# Patient Record
Sex: Female | Born: 1955 | Race: White | Hispanic: No | Marital: Married | Smoking: Current every day smoker
Health system: Southern US, Community
[De-identification: ages and names within clinical notes are randomized; demographics above are authoritative.]

## PROBLEM LIST (undated history)

## (undated) DIAGNOSIS — F17200 Nicotine dependence, unspecified, uncomplicated: Secondary | ICD-10-CM

## (undated) DIAGNOSIS — E785 Hyperlipidemia, unspecified: Secondary | ICD-10-CM

## (undated) DIAGNOSIS — Z9889 Other specified postprocedural states: Secondary | ICD-10-CM

## (undated) DIAGNOSIS — C801 Malignant (primary) neoplasm, unspecified: Secondary | ICD-10-CM

## (undated) DIAGNOSIS — I10 Essential (primary) hypertension: Secondary | ICD-10-CM

## (undated) DIAGNOSIS — Z8049 Family history of malignant neoplasm of other genital organs: Secondary | ICD-10-CM

## (undated) DIAGNOSIS — I2510 Atherosclerotic heart disease of native coronary artery without angina pectoris: Secondary | ICD-10-CM

## (undated) DIAGNOSIS — I219 Acute myocardial infarction, unspecified: Secondary | ICD-10-CM

## (undated) DIAGNOSIS — Z806 Family history of leukemia: Secondary | ICD-10-CM

## (undated) DIAGNOSIS — R112 Nausea with vomiting, unspecified: Secondary | ICD-10-CM

## (undated) DIAGNOSIS — Z8052 Family history of malignant neoplasm of bladder: Secondary | ICD-10-CM

## (undated) DIAGNOSIS — Z803 Family history of malignant neoplasm of breast: Secondary | ICD-10-CM

## (undated) HISTORY — DX: Family history of malignant neoplasm of other genital organs: Z80.49

## (undated) HISTORY — DX: Family history of malignant neoplasm of breast: Z80.3

## (undated) HISTORY — DX: Family history of leukemia: Z80.6

## (undated) HISTORY — DX: Family history of malignant neoplasm of bladder: Z80.52

## (undated) HISTORY — PX: ABDOMINAL HYSTERECTOMY: SHX81

## (undated) MED FILL — KCl 20 MEQ/L (0.15%) in NaCl 0.9% Inj: INTRAVENOUS | Qty: 1000 | Status: AC

## (undated) MED FILL — KCl 40 MEQ/L (0.3%) in NaCl 0.9% Inj: INTRAVENOUS | Qty: 1000 | Status: AC

## (undated) MED FILL — Ondansetron HCl Inj 40 MG/20ML (2 MG/ML): INTRAMUSCULAR | Qty: 4 | Status: AC

---

## 2018-01-10 ENCOUNTER — Encounter (HOSPITAL_COMMUNITY): Payer: Self-pay | Admitting: Physician Assistant

## 2018-01-10 ENCOUNTER — Other Ambulatory Visit: Payer: Self-pay

## 2018-01-10 ENCOUNTER — Inpatient Hospital Stay (HOSPITAL_COMMUNITY)
Admission: EM | Admit: 2018-01-10 | Discharge: 2018-01-12 | DRG: 247 | Disposition: A | Discharge status: Home / Self Care | Payer: PRIVATE HEALTH INSURANCE | Attending: Cardiovascular Disease | Admitting: Cardiovascular Disease

## 2018-01-10 ENCOUNTER — Inpatient Hospital Stay (HOSPITAL_COMMUNITY)
Admission: EM | Disposition: A | Discharge status: Home / Self Care | Payer: Self-pay | Source: Home / Self Care | Attending: Cardiovascular Disease

## 2018-01-10 DIAGNOSIS — I2102 ST elevation (STEMI) myocardial infarction involving left anterior descending coronary artery: Secondary | ICD-10-CM | POA: Diagnosis not present

## 2018-01-10 DIAGNOSIS — I2510 Atherosclerotic heart disease of native coronary artery without angina pectoris: Secondary | ICD-10-CM | POA: Diagnosis present

## 2018-01-10 DIAGNOSIS — I200 Unstable angina: Secondary | ICD-10-CM | POA: Diagnosis present

## 2018-01-10 DIAGNOSIS — E785 Hyperlipidemia, unspecified: Secondary | ICD-10-CM

## 2018-01-10 DIAGNOSIS — Z955 Presence of coronary angioplasty implant and graft: Secondary | ICD-10-CM

## 2018-01-10 DIAGNOSIS — Z8249 Family history of ischemic heart disease and other diseases of the circulatory system: Secondary | ICD-10-CM | POA: Diagnosis not present

## 2018-01-10 DIAGNOSIS — I340 Nonrheumatic mitral (valve) insufficiency: Secondary | ICD-10-CM | POA: Diagnosis not present

## 2018-01-10 DIAGNOSIS — F17200 Nicotine dependence, unspecified, uncomplicated: Secondary | ICD-10-CM

## 2018-01-10 DIAGNOSIS — I2109 ST elevation (STEMI) myocardial infarction involving other coronary artery of anterior wall: Secondary | ICD-10-CM | POA: Diagnosis present

## 2018-01-10 DIAGNOSIS — I213 ST elevation (STEMI) myocardial infarction of unspecified site: Secondary | ICD-10-CM

## 2018-01-10 HISTORY — DX: Nicotine dependence, unspecified, uncomplicated: F17.200

## 2018-01-10 HISTORY — PX: LEFT HEART CATH AND CORONARY ANGIOGRAPHY: CATH118249

## 2018-01-10 HISTORY — DX: Essential (primary) hypertension: I10

## 2018-01-10 HISTORY — DX: Atherosclerotic heart disease of native coronary artery without angina pectoris: I25.10

## 2018-01-10 HISTORY — DX: Hyperlipidemia, unspecified: E78.5

## 2018-01-10 HISTORY — PX: CORONARY STENT INTERVENTION: CATH118234

## 2018-01-10 LAB — COMPREHENSIVE METABOLIC PANEL
ALBUMIN: 3.8 g/dL (ref 3.5–5.0)
ALT: 18 U/L (ref 14–54)
ANION GAP: 12 (ref 5–15)
AST: 17 U/L (ref 15–41)
Alkaline Phosphatase: 118 U/L (ref 38–126)
BUN: 14 mg/dL (ref 6–20)
CALCIUM: 10 mg/dL (ref 8.9–10.3)
CHLORIDE: 106 mmol/L (ref 101–111)
CO2: 22 mmol/L (ref 22–32)
CREATININE: 0.87 mg/dL (ref 0.44–1.00)
Glucose, Bld: 134 mg/dL — ABNORMAL HIGH (ref 65–99)
Potassium: 3.1 mmol/L — ABNORMAL LOW (ref 3.5–5.1)
Sodium: 140 mmol/L (ref 135–145)
Total Bilirubin: 0.8 mg/dL (ref 0.3–1.2)
Total Protein: 6.8 g/dL (ref 6.5–8.1)

## 2018-01-10 LAB — POCT ACTIVATED CLOTTING TIME
Activated Clotting Time: 400 seconds
Activated Clotting Time: 351 seconds

## 2018-01-10 LAB — POCT I-STAT TROPONIN I: TROPONIN I, POC: 0.12 ng/mL — AB (ref 0.00–0.08)

## 2018-01-10 LAB — TROPONIN I
TROPONIN I: 37.4 ng/mL — AB (ref <0.03)
Troponin I: 42.58 ng/mL (ref <0.03)

## 2018-01-10 LAB — MAGNESIUM: MAGNESIUM: 1.9 mg/dL (ref 1.7–2.4)

## 2018-01-10 LAB — I-STAT CHEM 8, ED
BUN: 16 mg/dL (ref 6–20)
CHLORIDE: 105 mmol/L (ref 101–111)
Calcium, Ion: 1.22 mmol/L (ref 1.15–1.40)
Creatinine, Ser: 0.9 mg/dL (ref 0.44–1.00)
GLUCOSE: 145 mg/dL — AB (ref 65–99)
HCT: 45 % (ref 36.0–46.0)
Hemoglobin: 15.3 g/dL — ABNORMAL HIGH (ref 12.0–15.0)
POTASSIUM: 3.2 mmol/L — AB (ref 3.5–5.1)
Sodium: 139 mmol/L (ref 135–145)
TCO2: 22 mmol/L (ref 22–32)

## 2018-01-10 LAB — HEMOGLOBIN A1C
HEMOGLOBIN A1C: 4.5 % — AB (ref 4.8–5.6)
Mean Plasma Glucose: 82.45 mg/dL

## 2018-01-10 LAB — MRSA PCR SCREENING: MRSA BY PCR: NEGATIVE

## 2018-01-10 SURGERY — LEFT HEART CATH AND CORONARY ANGIOGRAPHY
Anesthesia: LOCAL

## 2018-01-10 MED ORDER — ASPIRIN EC 81 MG PO TBEC
81.0000 mg | DELAYED_RELEASE_TABLET | Freq: Every day | ORAL | Status: DC
  Administered 2018-01-11 – 2018-01-12 (×2): 81 mg via ORAL
  Filled 2018-01-10 (×2): qty 1

## 2018-01-10 MED ORDER — ASPIRIN 81 MG PO CHEW: 81.0000 mg | CHEWABLE_TABLET | Freq: Every day | ORAL | Status: DC

## 2018-01-10 MED ORDER — HEPARIN (PORCINE) IN NACL 2-0.9 UNITS/ML
INTRAMUSCULAR | Status: AC | PRN
  Administered 2018-01-10 (×2): 500 mL via INTRA_ARTERIAL

## 2018-01-10 MED ORDER — VERAPAMIL HCL 2.5 MG/ML IV SOLN
INTRAVENOUS | Status: DC | PRN
  Administered 2018-01-10: 10 mL via INTRA_ARTERIAL

## 2018-01-10 MED ORDER — NITROGLYCERIN 1 MG/10 ML FOR IR/CATH LAB
INTRA_ARTERIAL | Status: AC
  Filled 2018-01-10: qty 10

## 2018-01-10 MED ORDER — FENTANYL CITRATE (PF) 100 MCG/2ML IJ SOLN
INTRAMUSCULAR | Status: DC | PRN
  Administered 2018-01-10 (×2): 25 ug via INTRAVENOUS

## 2018-01-10 MED ORDER — HYDRALAZINE HCL 20 MG/ML IJ SOLN: 5.0000 mg | INTRAMUSCULAR | Status: AC | PRN

## 2018-01-10 MED ORDER — LIDOCAINE HCL (PF) 1 % IJ SOLN
INTRAMUSCULAR | Status: DC | PRN
  Administered 2018-01-10: 2 mL

## 2018-01-10 MED ORDER — METOPROLOL TARTRATE 12.5 MG HALF TABLET
12.5000 mg | ORAL_TABLET | Freq: Two times a day (BID) | ORAL | Status: DC
  Administered 2018-01-10 – 2018-01-12 (×5): 12.5 mg via ORAL
  Filled 2018-01-10 (×5): qty 1

## 2018-01-10 MED ORDER — SODIUM CHLORIDE 0.9 % IV SOLN: 250.0000 mL | INTRAVENOUS | Status: DC | PRN

## 2018-01-10 MED ORDER — FUROSEMIDE 10 MG/ML IJ SOLN
40.0000 mg | Freq: Once | INTRAMUSCULAR | Status: AC
  Administered 2018-01-10: 40 mg via INTRAVENOUS

## 2018-01-10 MED ORDER — LABETALOL HCL 5 MG/ML IV SOLN
10.0000 mg | INTRAVENOUS | Status: AC | PRN
  Administered 2018-01-10 (×4): 10 mg via INTRAVENOUS
  Filled 2018-01-10 (×2): qty 4

## 2018-01-10 MED ORDER — FUROSEMIDE 10 MG/ML IJ SOLN
INTRAMUSCULAR | Status: AC
  Filled 2018-01-10: qty 4

## 2018-01-10 MED ORDER — MIDAZOLAM HCL 2 MG/2ML IJ SOLN
INTRAMUSCULAR | Status: DC | PRN
  Administered 2018-01-10 (×2): 1 mg via INTRAVENOUS

## 2018-01-10 MED ORDER — NITROGLYCERIN 0.4 MG SL SUBL
0.4000 mg | SUBLINGUAL_TABLET | SUBLINGUAL | Status: DC | PRN
  Administered 2018-01-10 (×2): 0.4 mg via SUBLINGUAL
  Filled 2018-01-10 (×3): qty 1

## 2018-01-10 MED ORDER — TICAGRELOR 90 MG PO TABS
ORAL_TABLET | ORAL | Status: DC | PRN
  Administered 2018-01-10: 180 mg via ORAL

## 2018-01-10 MED ORDER — ZOLPIDEM TARTRATE 5 MG PO TABS
5.0000 mg | ORAL_TABLET | Freq: Every evening | ORAL | Status: DC | PRN
  Administered 2018-01-10 – 2018-01-11 (×2): 5 mg via ORAL
  Filled 2018-01-10 (×2): qty 1

## 2018-01-10 MED ORDER — MIDAZOLAM HCL 2 MG/2ML IJ SOLN
INTRAMUSCULAR | Status: AC
  Filled 2018-01-10: qty 2

## 2018-01-10 MED ORDER — HEPARIN SODIUM (PORCINE) 1000 UNIT/ML IJ SOLN
INTRAMUSCULAR | Status: AC
  Filled 2018-01-10: qty 1

## 2018-01-10 MED ORDER — HEPARIN BOLUS VIA INFUSION: 4000.0000 [IU] | Freq: Once | INTRAVENOUS | Status: DC

## 2018-01-10 MED ORDER — TIROFIBAN (AGGRASTAT) BOLUS VIA INFUSION
INTRAVENOUS | Status: DC | PRN
  Administered 2018-01-10: 1700 ug via INTRAVENOUS

## 2018-01-10 MED ORDER — HEPARIN SODIUM (PORCINE) 5000 UNIT/ML IJ SOLN: 4000.0000 [IU] | Freq: Once | INTRAMUSCULAR | Status: DC

## 2018-01-10 MED ORDER — NITROGLYCERIN 1 MG/10 ML FOR IR/CATH LAB
INTRA_ARTERIAL | Status: DC | PRN
  Administered 2018-01-10: 200 ug via INTRACORONARY

## 2018-01-10 MED ORDER — ACETAMINOPHEN 325 MG PO TABS: 650.0000 mg | ORAL_TABLET | ORAL | Status: DC | PRN

## 2018-01-10 MED ORDER — HEPARIN SODIUM (PORCINE) 1000 UNIT/ML IJ SOLN
INTRAMUSCULAR | Status: DC | PRN
  Administered 2018-01-10: 8000 [IU] via INTRAVENOUS

## 2018-01-10 MED ORDER — LIDOCAINE HCL (PF) 1 % IJ SOLN
INTRAMUSCULAR | Status: AC
  Filled 2018-01-10: qty 30

## 2018-01-10 MED ORDER — TIROFIBAN HCL IN NACL 5-0.9 MG/100ML-% IV SOLN
INTRAVENOUS | Status: DC | PRN
  Administered 2018-01-10: 0.15 ug/kg/min via INTRAVENOUS

## 2018-01-10 MED ORDER — TIROFIBAN HCL IN NACL 5-0.9 MG/100ML-% IV SOLN: 0.1500 ug/kg/min | INTRAVENOUS | Status: AC

## 2018-01-10 MED ORDER — FENTANYL CITRATE (PF) 100 MCG/2ML IJ SOLN
INTRAMUSCULAR | Status: AC
  Filled 2018-01-10: qty 2

## 2018-01-10 MED ORDER — IOHEXOL 350 MG/ML SOLN
INTRAVENOUS | Status: DC | PRN
  Administered 2018-01-10: 90 mL via INTRA_ARTERIAL

## 2018-01-10 MED ORDER — SODIUM CHLORIDE 0.9% FLUSH
3.0000 mL | Freq: Two times a day (BID) | INTRAVENOUS | Status: DC
  Administered 2018-01-10 – 2018-01-11 (×4): 3 mL via INTRAVENOUS

## 2018-01-10 MED ORDER — POTASSIUM CHLORIDE CRYS ER 20 MEQ PO TBCR
40.0000 meq | EXTENDED_RELEASE_TABLET | Freq: Once | ORAL | Status: AC
  Administered 2018-01-10: 40 meq via ORAL
  Filled 2018-01-10: qty 2

## 2018-01-10 MED ORDER — IOPAMIDOL (ISOVUE-370) INJECTION 76%
INTRAVENOUS | Status: DC | PRN
  Administered 2018-01-10: 40 mL via INTRA_ARTERIAL

## 2018-01-10 MED ORDER — IOPAMIDOL (ISOVUE-370) INJECTION 76%
INTRAVENOUS | Status: AC
  Filled 2018-01-10: qty 50

## 2018-01-10 MED ORDER — ONDANSETRON HCL 4 MG/2ML IJ SOLN: 4.0000 mg | Freq: Four times a day (QID) | INTRAMUSCULAR | Status: DC | PRN

## 2018-01-10 MED ORDER — HEPARIN (PORCINE) IN NACL 1000-0.9 UT/500ML-% IV SOLN
INTRAVENOUS | Status: AC
  Filled 2018-01-10: qty 1000

## 2018-01-10 MED ORDER — TICAGRELOR 90 MG PO TABS
ORAL_TABLET | ORAL | Status: AC
  Filled 2018-01-10: qty 2

## 2018-01-10 MED ORDER — ONDANSETRON HCL 4 MG/2ML IJ SOLN
4.0000 mg | Freq: Four times a day (QID) | INTRAMUSCULAR | Status: DC | PRN
  Administered 2018-01-10: 4 mg via INTRAVENOUS
  Filled 2018-01-10: qty 2

## 2018-01-10 MED ORDER — LISINOPRIL 10 MG PO TABS
10.0000 mg | ORAL_TABLET | Freq: Every day | ORAL | Status: DC
  Administered 2018-01-10 – 2018-01-12 (×3): 10 mg via ORAL
  Filled 2018-01-10 (×3): qty 1

## 2018-01-10 MED ORDER — ATORVASTATIN CALCIUM 80 MG PO TABS
80.0000 mg | ORAL_TABLET | Freq: Every day | ORAL | Status: DC
  Administered 2018-01-10 – 2018-01-11 (×2): 80 mg via ORAL
  Filled 2018-01-10 (×2): qty 1

## 2018-01-10 MED ORDER — VERAPAMIL HCL 2.5 MG/ML IV SOLN
INTRAVENOUS | Status: AC
  Filled 2018-01-10: qty 2

## 2018-01-10 MED ORDER — SODIUM CHLORIDE 0.9% FLUSH: 3.0000 mL | INTRAVENOUS | Status: DC | PRN

## 2018-01-10 MED ORDER — TICAGRELOR 90 MG PO TABS
90.0000 mg | ORAL_TABLET | Freq: Two times a day (BID) | ORAL | Status: DC
  Administered 2018-01-10 – 2018-01-12 (×4): 90 mg via ORAL
  Filled 2018-01-10 (×4): qty 1

## 2018-01-10 SURGICAL SUPPLY — 21 items
BALLN SAPPHIRE 2.5X15 (BALLOONS) ×2
BALLN ~~LOC~~ EMERGE MR 3.5X12 (BALLOONS) ×2
BALLOON SAPPHIRE 2.5X15 (BALLOONS) ×1 IMPLANT
BALLOON ~~LOC~~ EMERGE MR 3.5X12 (BALLOONS) ×1 IMPLANT
CATH INFINITI JR4 5F (CATHETERS) ×2 IMPLANT
CATH LAUNCHER 6FR EBU 3 (CATHETERS) ×2 IMPLANT
CATH OPTICROSS 40MHZ (CATHETERS) ×2 IMPLANT
DEVICE RAD COMP TR BAND LRG (VASCULAR PRODUCTS) ×4 IMPLANT
GUIDEWIRE INQWIRE 1.5J.035X260 (WIRE) ×1 IMPLANT
INQWIRE 1.5J .035X260CM (WIRE) ×2
KIT HEART LEFT (KITS) ×2 IMPLANT
NEEDLE PERC 21GX4CM (NEEDLE) ×2 IMPLANT
PACK CARDIAC CATHETERIZATION (CUSTOM PROCEDURE TRAY) ×2 IMPLANT
SHEATH RAIN RADIAL 21G 6FR (SHEATH) ×2 IMPLANT
SLED PULL BACK IVUS (MISCELLANEOUS) ×2 IMPLANT
STENT SYNERGY DES 3.5X16 (Permanent Stent) ×2 IMPLANT
STENT SYNERGY DES 3X24 (Permanent Stent) ×2 IMPLANT
TRANSDUCER W/STOPCOCK (MISCELLANEOUS) ×2 IMPLANT
TUBING CIL FLEX 10 FLL-RA (TUBING) ×2 IMPLANT
WIRE ASAHI PROWATER 180CM (WIRE) ×2 IMPLANT
WIRE HI TORQ BMW 190CM (WIRE) ×4 IMPLANT

## 2018-01-10 NOTE — Progress Notes (Signed)
At 1620 today pt was c/o SOB, cp 6/10, fine crackles auscultated in bl bases.  12 lead EKG performed possible changes indicated. Pt received 2 sl nitro, pain stated 2/10.  Dr. Irish Lack paged stated no significant changes in EKG ordered 40 mg IV lasix.  Will continue to monitor and assess pt closely.

## 2018-01-10 NOTE — ED Triage Notes (Signed)
Patient states she was up getting ready  For work and started having mid sternal chest pain. Patient was given asa 324mg  ,m ntg x 1 and fentanyl 50 mg iv per ems. Upon arrival to ED rates pain as 5/10, stays in her chest , no sob .

## 2018-01-10 NOTE — Progress Notes (Signed)
Called to 2H04 to assess pt. Post cath- R radial site. Upon arrival noted 2 TR Bands in place, R hand noted discolored, cool, with patient c/o paraesthesia. To hand. Noted BP 180/110s, r upper extremity elevated upon pillow. R radial pleath  With a positive waveform-   RN addressed B/P - responded to meds, Top TR band removed- manual pressure held to r forearm for moderate hematoma, bottom TR band  Deflation started from 12 cc- color and hand sensation began to improve. Manual pressure to decompress hematoma to r forearm -to wrist successful with completed deflation and removal of TR band from r wrist. Jarrett Soho, RN to take over holding/expressing very mild hematoma to wrist area. Pt. Did not have pain and tolerated well. Upon my exiting- r hand pink with restored sensation. Pt. Left in the presence of Jarrett Soho ,RN and protege' in stable condition.

## 2018-01-10 NOTE — H&P (Addendum)
Cardiology Admission History and Physical:   Patient ID: Traci Fuller; MRN: [id number]; DOB: 12-16-1955   Admission date: 01/10/2018  Primary Care Provider: No primary care provider on file. Primary Cardiologist: new - Dr. Gwenlyn Found Primary Electrophysiologist:    Chief Complaint:  Anterior STEMI  Patient Profile:   Traci Fuller is a 62 y.o. female with no prior medical history.  History of Present Illness:   Traci Fuller presents to [facility] via EMS for chest pain. She had chest pain 2 night ago that she thought was indigestion that eventually resolved. She had a recurrence of chest pain this morning at approximately 0700 that radiated to both arms. She takes no medications at home, does not have a PCP, and has no past medical history. She is a current everyday smoker with a 45 pack year history. EMS administered 324 mg ASA. On arrival, she complained of 5/10 chest pain described as a crushing pressure.   Will admit to cardiology for emergent heart cath. She was given 4000 U heparin.   History reviewed. No pertinent past medical history.  History reviewed. No pertinent surgical history.   Medications Prior to Admission: Prior to Admission medications   Not on File     Allergies:   Not on File  Social History:   Social History   Socioeconomic History  . Marital status: Not on file    Spouse name: Not on file  . Number of children: Not on file  . Years of education: Not on file  . Highest education level: Not on file  Occupational History  . Not on file  Social Needs  . Financial resource strain: Not on file  . Food insecurity:    Worry: Not on file    Inability: Not on file  . Transportation needs:    Medical: Not on file    Non-medical: Not on file  Tobacco Use  . Smoking status: Not on file  Substance and Sexual Activity  . Alcohol use: Not on file  . Drug use: Not on file  . Sexual activity: Not on file  Lifestyle  . Physical activity:    Days per week: Not on  file    Minutes per session: Not on file  . Stress: Not on file  Relationships  . Social connections:    Talks on phone: Not on file    Gets together: Not on file    Attends religious service: Not on file    Active member of club or organization: Not on file    Attends meetings of clubs or organizations: Not on file    Relationship status: Not on file  . Intimate partner violence:    Fear of current or ex partner: Not on file    Emotionally abused: Not on file    Physically abused: Not on file    Forced sexual activity: Not on file  Other Topics Concern  . Not on file  Social History Narrative  . Not on file    Family History:   The patient's family history includes Hypertension in her father.    ROS:  Please see the history of present illness.  All other ROS reviewed and negative.     Physical Exam/Data:   Vitals:   01/10/18 0840  SpO2: 96%   No intake or output data in the 24 hours ending 01/10/18 0844 There were no vitals filed for this visit. There is no height or weight on file to calculate BMI.  General:  Well  nourished, well developed, in no acute distress HEENT: normal Neck: no JVD Vascular: No carotid bruits  Cardiac:  normal S1, S2; RRR; no murmur Lungs:  clear to auscultation bilaterally, no wheezing, rhonchi or rales  Abd: soft, nontender, no hepatomegaly  Ext: no edema Musculoskeletal:  No deformities, BUE and BLE strength normal and equal Skin: warm and dry  Neuro:  CNs 2-12 intact, no focal abnormalities noted Psych:  Normal affect    EKG:  The ECG that was done and was personally reviewed and demonstrates anterior STEMI  Relevant CV Studies:  none  Laboratory Data:  Chemistry Recent Labs  Lab 01/10/18 0835  NA 139  K 3.2*  CL 105  GLUCOSE 145*  BUN 16  CREATININE 0.90    No results for input(s): PROT, ALBUMIN, AST, ALT, ALKPHOS, BILITOT in the last 168 hours. Hematology Recent Labs  Lab 01/10/18 0835  HGB 15.3*  HCT 45.0    Cardiac EnzymesNo results for input(s): TROPONINI in the last 168 hours. No results for input(s): TROPIPOC in the last 168 hours.  BNPNo results for input(s): BNP, PROBNP in the last 168 hours.  DDimer No results for input(s): DDIMER in the last 168 hours.  Radiology/Studies:  No results found.  Assessment and Plan:   Anterior STEMI Pt presents with anterior ST elevations. She is taken emergently to the cath lab. Will order A1c, lipids, and echo. Started 80 mg lipitor.      Severity of Illness: The appropriate patient status for this patient is INPATIENT. Inpatient status is judged to be reasonable and necessary in order to provide the required intensity of service to ensure the patient's safety. The patient's presenting symptoms, physical exam findings, and initial radiographic and laboratory data in the context of their chronic comorbidities is felt to place them at high risk for further clinical deterioration. Furthermore, it is not anticipated that the patient will be medically stable for discharge from the hospital within 2 midnights of admission. The following factors support the patient status of inpatient.   " The patient's presenting symptoms include unstable angina. " The worrisome physical exam findings include . " The initial radiographic and laboratory data are worrisome because of anterior stemi. " The chronic co-morbidities include current smoker.   * I certify that at the point of admission it is my clinical judgment that the patient will require inpatient hospital care spanning beyond 2 midnights from the point of admission due to high intensity of service, high risk for further deterioration and high frequency of surveillance required.*    For questions or updates, please contact [place] Please consult [email or web address] for contact info under Cardiology/STEMI.    Signed, Ledora Bottcher, [place]  01/10/2018 8:44 AM    Agree with note by Fabian Sharp  PA-C  62 year old Caucasian female without prior cardiac history had chest pain the night before last and again at 7:00 this morning with radiation to her arms.  EMS was called.  EKG shows anterior ST segment elevation.  She is hemodynamically stable and her exam is benign.  The plan is to take her directly to the Cath Lab for angiography and direct intervention.  Lorretta Harp, M.D., [place], [facility], Laverta [place] [address] [place], [place]  [address]  [phone] 01/10/2018 12:52 PM

## 2018-01-10 NOTE — ED Notes (Signed)
Transported emergently to the cath lab.

## 2018-01-10 NOTE — ED Provider Notes (Signed)
Emergency Department Provider Note   I have reviewed the triage vital signs and the nursing notes.   HISTORY  Chief Complaint Code STEMI   HPI Traci Fuller is a 62 y.o. female with past history of cigarette smoking resents to the emergency department for evaluation of acute onset left chest pain rating down the arm.  Pain symptoms began approximately 6:30 AM the patient was walking to feed her dogs.  EMS was called to the scene where of a obtained an EKG concerning for STEMI.  The patient was given nitroglycerin, aspirin, fentanyl in route.  On arrival to the emergency department she continues to have mild to moderate left-sided chest discomfort.  She denies any dyspnea or lightheadedness.  No fevers or chills.  No prior history of coronary artery disease.  History reviewed. No pertinent past medical history.  Patient Active Problem List   Diagnosis Date Noted  . Acute anterior wall MI ([place]) 01/10/2018  . Unstable angina ([place]) 01/10/2018     Allergies Patient has no allergy information on record.  Family History  Problem Relation Age of Onset  . Hypertension Father     Social History Social History   Tobacco Use  . Smoking status: Never Smoker  . Smokeless tobacco: Never Used  Substance Use Topics  . Alcohol use: Yes  . Drug use: Never    Review of Systems  Constitutional: No fever/chills Eyes: No visual changes. ENT: No sore throat. Cardiovascular: Positive chest pain. Respiratory: Denies shortness of breath. Gastrointestinal: No abdominal pain.  No nausea, no vomiting.  No diarrhea.  No constipation. Genitourinary: Negative for dysuria. Musculoskeletal: Negative for back pain. Skin: Negative for rash. Neurological: Negative for headaches, focal weakness or numbness.  10-point ROS otherwise negative.  ____________________________________________   PHYSICAL EXAM:  VITAL SIGNS: Vitals:   01/10/18 1450 01/10/18 1500  BP: (!) 154/87 (!) 163/98    Pulse: 68 68  Resp:  19  Temp:    SpO2:  98%    Constitutional: Alert and oriented. Patient appears somewhat uncomfortable.  Eyes: Conjunctivae are normal.  Head: Atraumatic. Nose: No congestion/rhinnorhea. Mouth/Throat: Mucous membranes are moist.   Neck: No stridor.  Cardiovascular: Normal rate, regular rhythm. Good peripheral circulation. Grossly normal heart sounds.   Respiratory: Normal respiratory effort.  No retractions. Lungs CTAB. Gastrointestinal: Soft and nontender. No distention.  Musculoskeletal: No lower extremity tenderness nor edema. No gross deformities of extremities. Neurologic:  Normal speech and language. No gross focal neurologic deficits are appreciated.  Skin:  Skin is warm, dry and intact. No rash noted.  ____________________________________________   LABS (all labs ordered are listed, but only abnormal results are displayed)  Labs Reviewed  COMPREHENSIVE METABOLIC PANEL - Abnormal; Notable for the following components:      Result Value   Potassium 3.1 (*)    Glucose, Bld 134 (*)    All other components within normal limits  HEMOGLOBIN A1C - Abnormal; Notable for the following components:   Hgb A1c MFr Bld 4.5 (*)    All other components within normal limits  TROPONIN I - Abnormal; Notable for the following components:   Troponin I 42.58 (*)    All other components within normal limits  I-STAT CHEM 8, ED - Abnormal; Notable for the following components:   Potassium 3.2 (*)    Glucose, Bld 145 (*)    Hemoglobin 15.3 (*)    All other components within normal limits  POCT I-STAT TROPONIN I - Abnormal; Notable for the following  components:   Troponin i, poc 0.12 (*)    All other components within normal limits  MRSA PCR SCREENING  MAGNESIUM  TROPONIN I  TROPONIN I  HIV ANTIBODY (ROUTINE TESTING)   ____________________________________________  EKG  Reviewed EMS EKG. Patient with ST elevation in V1-V3. NSR. Positive STEMI.   ____________________________________________  RADIOLOGY  None ____________________________________________   PROCEDURES  Procedure(s) performed:   Procedures  CRITICAL CARE Performed by: Margette Fast Total critical care time: 10 minutes Critical care time was exclusive of separately billable procedures and treating other patients. Critical care was necessary to treat or prevent imminent or life-threatening deterioration. Critical care was time spent personally by me on the following activities: development of treatment plan with patient and/or surrogate as well as nursing, discussions with consultants, evaluation of patient's response to treatment, examination of patient, obtaining history from patient or surrogate, ordering and performing treatments and interventions, ordering and review of laboratory studies, ordering and review of radiographic studies, pulse oximetry and re-evaluation of patient's condition.  Nanda Quinton, MD Emergency Medicine  ____________________________________________   INITIAL IMPRESSION / ASSESSMENT AND PLAN / ED COURSE  Pertinent labs & imaging results that were available during my care of the patient were reviewed by me and considered in my medical decision making (see chart for details).  Patient presents to the emergency department as a code STEMI after review of field EKG. cardiology team at bedside on the patient's arrival with plan to take directly to the heart catheterization lab.  Patient's vital sign are largely unremarkable.  She does not require airway management. Zoll in place. Patient is safe for transport to cath lab.   Discussed patient's case with Cardiology. Care transferred to Cardiology service.  I reviewed all nursing notes, vitals, pertinent old records, EKGs, labs, imaging (as available).  ____________________________________________  FINAL CLINICAL IMPRESSION(S) / ED DIAGNOSES  Final diagnoses:  ST elevation myocardial  infarction (STEMI), unspecified artery ([place])     MEDICATIONS GIVEN DURING THIS VISIT:  Medications  heparin injection 4,000 Units ( Intravenous MAR Unhold 01/10/18 1044)  aspirin EC tablet 81 mg (has no administration in time range)  nitroGLYCERIN (NITROSTAT) SL tablet 0.4 mg (has no administration in time range)  metoprolol tartrate (LOPRESSOR) tablet 12.5 mg (12.5 mg Oral Given 01/10/18 1450)  atorvastatin (LIPITOR) tablet 80 mg (has no administration in time range)  labetalol (NORMODYNE,TRANDATE) injection 10 mg (10 mg Intravenous Given 01/10/18 1437)  hydrALAZINE (APRESOLINE) injection 5 mg (has no administration in time range)  acetaminophen (TYLENOL) tablet 650 mg (has no administration in time range)  ondansetron (ZOFRAN) injection 4 mg (has no administration in time range)  sodium chloride flush (NS) 0.9 % injection 3 mL (3 mLs Intravenous Given 01/10/18 1200)  sodium chloride flush (NS) 0.9 % injection 3 mL (has no administration in time range)  0.9 %  sodium chloride infusion (has no administration in time range)  ticagrelor (BRILINTA) tablet 90 mg (has no administration in time range)  tirofiban (AGGRASTAT) infusion 50 mcg/mL 100 mL (0.15 mcg/kg/min  68 kg Intravenous Not Given 01/10/18 1444)  heparin infusion 2 units/mL in 0.9 % sodium chloride (500 mLs Intra-arterial New Bag/Given 01/10/18 0848)  potassium chloride SA (K-DUR,KLOR-CON) CR tablet 40 mEq (40 mEq Oral Given 01/10/18 1338)    Note:  This document was prepared using Dragon voice recognition software and may include unintentional dictation errors.  Nanda Quinton, MD Emergency Medicine    Long, [facility], MD 01/10/18 [phone]

## 2018-01-10 NOTE — Progress Notes (Signed)
Cards fellow Charissa Bash called regarding pt's most recent BP 170/105. MD request to keep SBP <160. Orders to start lisinopril was given. Will initiate orders and continue to monitor patient.

## 2018-01-10 NOTE — Progress Notes (Signed)
CRITICAL VALUE ALERT  Critical Value:  42.58  Date & Time Notied:  01/10/2018, 1530  Provider Notified: Dr. Irish Lack   Orders Received/Actions taken: no new orders, expected result

## 2018-01-11 ENCOUNTER — Inpatient Hospital Stay (HOSPITAL_COMMUNITY): Payer: PRIVATE HEALTH INSURANCE

## 2018-01-11 ENCOUNTER — Other Ambulatory Visit: Payer: Self-pay

## 2018-01-11 DIAGNOSIS — I340 Nonrheumatic mitral (valve) insufficiency: Secondary | ICD-10-CM

## 2018-01-11 LAB — BASIC METABOLIC PANEL
Anion gap: 9 (ref 5–15)
BUN: 11 mg/dL (ref 6–20)
CHLORIDE: 106 mmol/L (ref 101–111)
CO2: 24 mmol/L (ref 22–32)
Calcium: 10.2 mg/dL (ref 8.9–10.3)
Creatinine, Ser: 0.91 mg/dL (ref 0.44–1.00)
GFR calc Af Amer: >60 mL/min (ref >60)
GLUCOSE: 108 mg/dL — AB (ref 65–99)
POTASSIUM: 4.1 mmol/L (ref 3.5–5.1)
Sodium: 139 mmol/L (ref 135–145)

## 2018-01-11 LAB — LIPID PANEL
Cholesterol: 169 mg/dL (ref 0–200)
HDL: 36 mg/dL — ABNORMAL LOW (ref >40)
LDL CALC: 100 mg/dL — AB (ref 0–99)
Total CHOL/HDL Ratio: 4.7 RATIO
Triglycerides: 165 mg/dL — ABNORMAL HIGH (ref <150)
VLDL: 33 mg/dL (ref 0–40)

## 2018-01-11 LAB — CBC
HEMATOCRIT: 46.7 % — AB (ref 36.0–46.0)
Hemoglobin: 14.8 g/dL (ref 12.0–15.0)
MCH: 29 pg (ref 26.0–34.0)
MCHC: 31.7 g/dL (ref 30.0–36.0)
MCV: 91.6 fL (ref 78.0–100.0)
Platelets: 256 10*3/uL (ref 150–400)
RBC: 5.1 MIL/uL (ref 3.87–5.11)
RDW: 14.6 % (ref 11.5–15.5)
WBC: 13.1 10*3/uL — ABNORMAL HIGH (ref 4.0–10.5)

## 2018-01-11 LAB — ECHOCARDIOGRAM COMPLETE
Height: 61 in
WEIGHTICAEL: 2624.36 [oz_av]

## 2018-01-11 LAB — TROPONIN I: TROPONIN I: 22.26 ng/mL — AB (ref <0.03)

## 2018-01-11 LAB — HIV ANTIBODY (ROUTINE TESTING W REFLEX): HIV SCREEN 4TH GENERATION: NONREACTIVE

## 2018-01-11 MED ORDER — NICOTINE 7 MG/24HR TD PT24
7.0000 mg | MEDICATED_PATCH | Freq: Every day | TRANSDERMAL | Status: DC
  Administered 2018-01-11 – 2018-01-12 (×2): 7 mg via TRANSDERMAL
  Filled 2018-01-11 (×2): qty 1

## 2018-01-11 MED FILL — Heparin Sod (Porcine)-NaCl IV Soln 1000 Unit/500ML-0.9%: INTRAVENOUS | Qty: 1000 | Status: AC

## 2018-01-11 NOTE — Progress Notes (Signed)
Per insurance check on Brilinta  Brilinta 90 mg bid- is covered however she must pay her Deductible which is 150.00 and the Drug will be 60.00

## 2018-01-11 NOTE — Progress Notes (Signed)
  Echocardiogram 2D Echocardiogram has been performed.  Traci Fuller 01/11/2018, 3:20 PM

## 2018-01-11 NOTE — Progress Notes (Addendum)
3:00pm Spoke w patient's spouse at bedside while patient was getting echo. Explained use of 30 day coupon. He verbalized understanding. He provided insurance card which showed Rx coverage. Benefit check is pending.  Discussed importance of not stopping drug. If copay is expensive there is patient assistance, showed number on back of pamphlet. Also explained there may be alternatives if cost is prohibitive, and they would need to discuss that with their MD. 4:30pm Provided patient and spouse with copay information and reinforce use of 30 day coupon and copay card

## 2018-01-11 NOTE — Progress Notes (Signed)
Called MD Charissa Bash regarding patient's blood pressure 160/91. No new orders received. Will continue to monitor patient closely.

## 2018-01-11 NOTE — Progress Notes (Addendum)
CARDIAC REHAB PHASE I   PRE:  Rate/Rhythm: 73 SR  BP:  Supine:   Sitting: 147/77  Standing:    SaO2: 96%RA  MODE:  Ambulation: 370 ft   POST:  Rate/Rhythm: 84 SR  BP:  Supine:   Sitting: 165/88  Standing:    SaO2: 99%RA 1000-1046 Pt walked 370 ft on RA with steady gait and no CP. Tolerated well. MI education completed. Reviewed NTG use, MI restrictions, stent and purpose of brilinta, heart healthy food choices, smoking cessation and CRP 2. Gave fake cigarette and smoking cessation handout. Pt has never quit before. Referring to [place] Phase 2 with comment that pt needs staged PCI first. Pt needs to see case manager re brilinta. Ex ed not discussed as pt for staged PCI. Refer to cardiology for activity at home.  Graylon Good, RN BSN  01/11/2018 10:43 AM

## 2018-01-11 NOTE — Plan of Care (Signed)
  Problem: Education: Goal: Understanding of cardiac disease, CV risk reduction, and recovery process will improve Outcome: Progressing Goal: Understanding of medication regimen will improve Outcome: Progressing   Problem: Cardiac: Goal: Ability to achieve and maintain adequate cardiopulmonary perfusion will improve Outcome: Progressing Goal: Vascular access site(s) Level 0-1 will be maintained Outcome: Progressing   Problem: Education: Goal: Knowledge of General Education information will improve Outcome: Progressing   Problem: Pain Managment: Goal: General experience of comfort will improve Outcome: Progressing

## 2018-01-11 NOTE — Progress Notes (Signed)
Progress Note  Patient Name: Traci Fuller Date of Encounter: 01/11/2018  Primary Cardiologist: Gwenlyn Found  Subjective   62 year old Caucasian female postop day 1 anterior STEMI treated with PCI and drug-eluting stenting of her proximal LAD by Dr. Irish Lack with overlapping drug-eluting stents that was IVIS guided because of the proximal edge dissection.  She does have residual residual disease in her RCA and circumflex.  She denies chest pain or shortness of breath.  Inpatient Medications    Scheduled Meds: . aspirin EC  81 mg Oral Daily  . atorvastatin  80 mg Oral q1800  . heparin  4,000 Units Intravenous Once  . lisinopril  10 mg Oral Daily  . metoprolol tartrate  12.5 mg Oral BID  . sodium chloride flush  3 mL Intravenous Q12H  . ticagrelor  90 mg Oral BID   Continuous Infusions: . sodium chloride     PRN Meds: sodium chloride, acetaminophen, nitroGLYCERIN, ondansetron (ZOFRAN) IV, sodium chloride flush, zolpidem   Vital Signs    Vitals:   01/11/18 0500 01/11/18 0600 01/11/18 0700 01/11/18 0800  BP: (!) 132/92 (!) 153/87 (!) 151/87 (!) 149/79  Pulse: 65 66 69 72  Resp: 20 16 19 16   Temp:      TempSrc:      SpO2: 93% 95% 94% 96%  Weight: 164 lb 0.4 oz (74.4 kg)     Height:        Intake/Output Summary (Last 24 hours) at 01/11/2018 0901 Last data filed at 01/11/2018 0600 Gross per 24 hour  Intake 960 ml  Output 2350 ml  Net -1390 ml   Filed Weights   01/10/18 0844 01/11/18 0500  Weight: 150 lb (68 kg) 164 lb 0.4 oz (74.4 kg)    Telemetry    Normal sinus rhythm with occasional PVCs- Personally Reviewed  ECG    Normal sinus rhythm at 63 with septal Q waves, anterolateral T wave inversion, left axis deviation and inferior T wave inversion- Personally Reviewed  Physical Exam   GEN: No acute distress.   Neck: No JVD Cardiac: RRR, no murmurs, rubs, or gallops.  Respiratory: Clear to auscultation bilaterally. GI: Soft, nontender, non-distended  MS: No edema;  No deformity. Neuro:  Nonfocal  Psych: Normal affect  Extremity: Right radial puncture site appears well-healed  Labs    Chemistry Recent Labs  Lab 01/10/18 0830 01/10/18 0835 01/11/18 0450  NA 140 139 139  K 3.1* 3.2* 4.1  CL 106 105 106  CO2 22  --  24  GLUCOSE 134* 145* 108*  BUN 14 16 11   CREATININE 0.87 0.90 0.91  CALCIUM 10.0  --  10.2  PROT 6.8  --   --   ALBUMIN 3.8  --   --   AST 17  --   --   ALT 18  --   --   ALKPHOS 118  --   --   BILITOT 0.8  --   --   GFRNONAA >60  --  >60  GFRAA >60  --  >60  ANIONGAP 12  --  9     Hematology Recent Labs  Lab 01/10/18 0835 01/11/18 0450  WBC  --  13.1*  RBC  --  5.10  HGB 15.3* 14.8  HCT 45.0 46.7*  MCV  --  91.6  MCH  --  29.0  MCHC  --  31.7  RDW  --  14.6  PLT  --  256    Cardiac Enzymes Recent Labs  Lab  01/10/18 1403 01/10/18 1904 01/10/18 2324  TROPONINI 42.58* 37.40* 22.26*    Recent Labs  Lab 01/10/18 0833  TROPIPOC 0.12*     BNPNo results for input(s): BNP, PROBNP in the last 168 hours.   DDimer No results for input(s): DDIMER in the last 168 hours.   Radiology    No results found.  Cardiac Studies   Cardiac catheterization/PCI (01/10/2018)  Conclusion     Mid RCA lesion is 75% stenosed.  Prox LAD lesion is 50% stenosed.  Mid LAD-1 lesion is 100% stenosed.  A drug-eluting stent was successfully placed using a STENT SYNERGY DES 3X24.  IVUS showed proximal edge dissection.  A drug-eluting stent was successfully placed using a STENT SYNERGY DES 3.5X16 to cover teh proximal edge of the above stent.  Post intervention, there is a 0% residual stenosis.  Mid Cx lesion is 60-70% stenosed.  LV end diastolic pressure is moderately elevated.  There is no aortic valve stenosis.  Significant tortuosity in the right subclavian made catheter torquing difficult for the RCA.   Significant tortuosity in the right subclavian made catheter torquing difficult for the RCA.     Recommend uninterrupted dual antiplatelet therapy with Aspirin 81mg  QD and Ticagrelor 90mg  BID for a minimum of 12 months (ACS - Class I recommendation).   Consider PCI of the RCA at a later time.  Elevated LVEDP.  She will likely need to recover from this anterior MI for a few weeks.  Consider femoral approach for the RCA PCI.      Patient Profile     62 y.o. female without prior cardiac history who does have a history of tobacco abuse.  She is on no medications at home.  She developed chest pain early yesterday morning and was brought to [facility] by EMS where her EKG showed anterior ST segment elevation.  She was brought urgently to the Cath Lab by Dr. Irish Lack who performed PCI and I was guided drug-eluting stenting of her proximal LAD.  Her troponins rose to approximately 40.  She does have residual disease in her RCA and circumflex.  Assessment & Plan    1: Coronary artery disease- postop day #1 anterior STEMI treated with IVUS guided proximal LAD PCI and drug-eluting stenting by Dr. Irish Lack with residual disease in the RCA and circumflex.  She is on DAPT in addition to optimal guideline driven pharmacology including high-dose statin therapy, beta-blocker and ACE inhibitor.  2: Hyperlipidemia- on high-dose statin therapy  2D echo is pending.  The patient will be transferred to telemetry bed today with cardiac rehab consultation.  She will be ambulated with intent for discharge tomorrow morning if she remains clinically stable.  For questions or updates, please contact [place] Please consult [email or web address] for contact info under Cardiology/STEMI.      Signed, Quay Burow, MD  01/11/2018, 9:01 AM

## 2018-01-12 ENCOUNTER — Encounter (HOSPITAL_COMMUNITY): Payer: Self-pay | Admitting: Physician Assistant

## 2018-01-12 ENCOUNTER — Telehealth: Payer: Self-pay | Admitting: *Deleted

## 2018-01-12 DIAGNOSIS — E785 Hyperlipidemia, unspecified: Secondary | ICD-10-CM

## 2018-01-12 DIAGNOSIS — F17200 Nicotine dependence, unspecified, uncomplicated: Secondary | ICD-10-CM

## 2018-01-12 MED ORDER — ASPIRIN 81 MG PO TBEC: 81.0000 mg | DELAYED_RELEASE_TABLET | Freq: Every day | ORAL | 3 refills | Status: DC

## 2018-01-12 MED ORDER — LISINOPRIL 10 MG PO TABS: 10.0000 mg | ORAL_TABLET | Freq: Every day | ORAL | 3 refills | Status: DC

## 2018-01-12 MED ORDER — METOPROLOL TARTRATE 25 MG PO TABS: 12.5000 mg | ORAL_TABLET | Freq: Two times a day (BID) | ORAL | 3 refills | Status: DC

## 2018-01-12 MED ORDER — TICAGRELOR 90 MG PO TABS: 90.0000 mg | ORAL_TABLET | Freq: Two times a day (BID) | ORAL | 0 refills | Status: DC

## 2018-01-12 MED ORDER — ATORVASTATIN CALCIUM 80 MG PO TABS: 80.0000 mg | ORAL_TABLET | Freq: Every day | ORAL | 3 refills | Status: DC

## 2018-01-12 MED ORDER — NITROGLYCERIN 0.4 MG SL SUBL: 0.4000 mg | SUBLINGUAL_TABLET | SUBLINGUAL | 1 refills | Status: DC | PRN

## 2018-01-12 MED ORDER — TICAGRELOR 90 MG PO TABS: 90.0000 mg | ORAL_TABLET | Freq: Two times a day (BID) | ORAL | 3 refills | Status: DC

## 2018-01-12 NOTE — Discharge Summary (Signed)
Discharge Summary    Patient ID: Traci Fuller,  MRN: [id number], DOB/AGE: Nov 13, 1955 62 y.o.  Admit date: 01/10/2018 Discharge date: 01/12/2018  Primary Care Provider: No primary care provider on file. Primary Cardiologist: Quay Burow, MD  Discharge Diagnoses    Principal Problem:   Acute anterior wall MI [facility]) Active Problems:   Unstable angina ([place])   Current smoker   Hyperlipidemia LDL goal <70   Allergies Not on File  Diagnostic Studies/Procedures    Left heart cath 01/10/18:  Mid RCA lesion is 75% stenosed.  Prox LAD lesion is 50% stenosed.  Mid LAD-1 lesion is 100% stenosed.  A drug-eluting stent was successfully placed using a STENT SYNERGY DES 3X24.  IVUS showed proximal edge dissection.  A drug-eluting stent was successfully placed using a STENT SYNERGY DES 3.5X16 to cover teh proximal edge of the above stent.  Post intervention, there is a 0% residual stenosis.  Mid Cx lesion is 60-70% stenosed.  LV end diastolic pressure is moderately elevated.  There is no aortic valve stenosis.  Significant tortuosity in the right subclavian made catheter torquing difficult for the RCA.  Significant tortuosity in the right subclavian made catheter torquing difficult for the RCA.   Recommend uninterrupted dual antiplatelet therapy with Aspirin 81mg  QD and Ticagrelor 90mg  BIDfor a minimum of 12 months (ACS - Class I recommendation).  Consider PCI of the RCA at a later time. Elevated LVEDP. She will likely need to recover from this anterior MI for a few weeks. Consider femoral approach for the RCA PCI.    Echo 01/11/18: Study Conclusions - Left ventricle: The cavity size was normal. There was moderate concentric hypertrophy. Systolic function was normal. The estimated ejection fraction was in the range of 55% to 60%. Wall motion was normal; there were no regional wall motion abnormalities. Doppler parameters are consistent with  abnormal left ventricular relaxation (grade 1 diastolic dysfunction). Doppler parameters are consistent with elevated ventricular end-diastolic filling pressure. - Ventricular septum: Septal motion showed paradox. - Aortic valve: Trileaflet; normal thickness leaflets. There was no regurgitation. Valve area (VTI): 3.97 cm^2. Valve area (Vmax): 3.49 cm^2. Valve area (Vmean): 3.62 cm^2. - Mitral valve: There was mild regurgitation. - Right ventricle: The cavity size was normal. Wall thickness was normal. Systolic function was normal. - Tricuspid valve: There was no regurgitation. - Pulmonary arteries: Systolic pressure could not be accurately estimated. - Inferior vena cava: The vessel was normal in size. - Pericardium, extracardiac: There was no pericardial effusion.   History of Present Illness     Traci Fuller presented to [facility] via EMS 01/10/18 for chest pain. She had chest pain 2 night prior that she thought was indigestion that eventually resolved. She had a recurrence of chest pain this morning at approximately 0700 that radiated to both arms. She takes no medications at home, does not have a PCP, and has no past medical history. She is a current everyday smoker with a 45 pack year history. EMS administered 324 mg ASA. On arrival, she complained of 5/10 chest pain described as a crushing pressure.   She was admitted to cardiology for emergent heart cath. She was given 4000 U heparin in the ER.  Hospital Course     Consultants: none  Anterior STEMI, CAD, s/p DES to midLAD with a proximal edge dissection treated with a second DES She tolerated the procedure well. She was started on ASA and brilinta without bleeding problems or shortness of breath. Right radial access site  is C/D/I. Echo with normal function and grade 1 DD. RCA with 75% may need to be treated in the future. For now she is chest pain free.   Hyperlipidemia with LDL goal < 70 01/11/2018: Cholesterol 169;  HDL 36; LDL Cholesterol 100; Triglycerides 165; VLDL 33 Started on 80 mg lipitor. She will need fasting lipids and LFTs in 6 weeks.    Essential hypertension Pressures have been slightly elevated. Continue lopressor and lisinopril. May need titration in the outpatient setting.     Current smoker Encouraged cessation.  _____________  Discharge Vitals Blood pressure 138/90, pulse 70, temperature 98.1 F (36.7 C), temperature source Oral, resp. rate 17, height 5\' 1"  (1.549 m), weight 161 lb 3.2 oz (73.1 kg), SpO2 96 %.  Filed Weights   01/11/18 0500 01/11/18 1600 01/12/18 0443  Weight: 164 lb 0.4 oz (74.4 kg) 160 lb 14.4 oz (73 kg) 161 lb 3.2 oz (73.1 kg)    Labs & Radiologic Studies    CBC Recent Labs    01/10/18 0835 01/11/18 0450  WBC  --  13.1*  HGB 15.3* 14.8  HCT 45.0 46.7*  MCV  --  91.6  PLT  --  093   Basic Metabolic Panel Recent Labs    01/10/18 0830 01/10/18 0835 01/11/18 0450  NA 140 139 139  K 3.1* 3.2* 4.1  CL 106 105 106  CO2 22  --  24  GLUCOSE 134* 145* 108*  BUN 14 16 11   CREATININE 0.87 0.90 0.91  CALCIUM 10.0  --  10.2  MG 1.9  --   --    Liver Function Tests Recent Labs    01/10/18 0830  AST 17  ALT 18  ALKPHOS 118  BILITOT 0.8  PROT 6.8  ALBUMIN 3.8   No results for input(s): LIPASE, AMYLASE in the last 72 hours. Cardiac Enzymes Recent Labs    01/10/18 1403 01/10/18 1904 01/10/18 2324  TROPONINI 42.58* 37.40* 22.26*   BNP Invalid input(s): POCBNP D-Dimer No results for input(s): DDIMER in the last 72 hours. Hemoglobin A1C Recent Labs    01/10/18 0830  HGBA1C 4.5*   Fasting Lipid Panel Recent Labs    01/11/18 0450  CHOL 169  HDL 36*  LDLCALC 100*  TRIG 165*  CHOLHDL 4.7   Thyroid Function Tests No results for input(s): TSH, T4TOTAL, T3FREE, THYROIDAB in the last 72 hours.  Invalid input(s): FREET3 _____________  No results found. Disposition   Pt is being discharged home today in good  condition.  Follow-up Plans & Appointments    Follow-up Information    Erlene Quan, PA-C Follow up on 01/19/2018.   Specialties:  Cardiology, Radiology Why:  11:30 am for TCM Contact information: [address] [place] [address] [phone]          Discharge Instructions    AMB Referral to Cardiac Rehabilitation - Phase II   Complete by:  As directed    Diagnosis:   Coronary Stents STEMI     Amb Referral to Cardiac Rehabilitation   Complete by:  As directed    For staged PCI   Diagnosis:   Coronary Stents STEMI     Diet - low sodium heart healthy   Complete by:  As directed    Discharge instructions   Complete by:  As directed    No driving for 1 week. No lifting over 5 lbs for 1 week. No sexual activity for 1 week. You may return to work  on 01/17/18. Keep procedure site clean & dry. If you notice increased pain, swelling, bleeding or pus, call/return!  You may shower, but no soaking baths/hot tubs/pools for 1 week.   Increase activity slowly   Complete by:  As directed       Discharge Medications   Allergies as of 01/12/2018   Not on File     Medication List    STOP taking these medications   pseudoephedrine-acetaminophen 30-500 MG Tabs tablet Commonly known as:  TYLENOL SINUS     TAKE these medications   aspirin 81 MG EC tablet Take 1 tablet (81 mg total) by mouth daily.   atorvastatin 80 MG tablet Commonly known as:  LIPITOR Take 1 tablet (80 mg total) by mouth daily at 6 PM.   cetirizine 10 MG tablet Commonly known as:  ZYRTEC Take 10 mg by mouth daily.   lisinopril 10 MG tablet Commonly known as:  PRINIVIL,ZESTRIL Take 1 tablet (10 mg total) by mouth daily.   metoprolol tartrate 25 MG tablet Commonly known as:  LOPRESSOR Take 0.5 tablets (12.5 mg total) by mouth 2 (two) times daily.   nitroGLYCERIN 0.4 MG SL tablet Commonly known as:  NITROSTAT Place 1 tablet (0.4 mg total) under the tongue every 5 (five) minutes x 3  doses as needed for chest pain.   ticagrelor 90 MG Tabs tablet Commonly known as:  BRILINTA Take 1 tablet (90 mg total) by mouth 2 (two) times daily.        Aspirin prescribed at discharge?  Yes High Intensity Statin Prescribed? (Lipitor 40-80mg  or Crestor 20-40mg ): Yes Beta Blocker Prescribed? Yes For EF <40%, was ACEI/ARB Prescribed? Yes ADP Receptor Inhibitor Prescribed? (i.e. Plavix etc.-Includes Medically Managed Patients): Yes For EF <40%, Aldosterone Inhibitor Prescribed? No: normal ef Was EF assessed during THIS hospitalization? Yes Was Cardiac Rehab II ordered? (Included Medically managed Patients): Yes   Outstanding Labs/Studies   Lipids and LFTs in 6 weeks  Duration of Discharge Encounter   Greater than 30 minutes including physician time.  Signed, [place], [place] 01/12/2018, 10:12 AM

## 2018-01-12 NOTE — Progress Notes (Addendum)
Progress Note  Patient Name: Traci Fuller Date of Encounter: 01/12/2018  Primary Cardiologist: Quay Burow, MD   Subjective   Pt is doing well after cath. No bleeding issues, no CP or SOB. She is ready for discharge.  Inpatient Medications    Scheduled Meds: . aspirin EC  81 mg Oral Daily  . atorvastatin  80 mg Oral q1800  . heparin  4,000 Units Intravenous Once  . lisinopril  10 mg Oral Daily  . metoprolol tartrate  12.5 mg Oral BID  . nicotine  7 mg Transdermal Daily  . sodium chloride flush  3 mL Intravenous Q12H  . ticagrelor  90 mg Oral BID   Continuous Infusions: . sodium chloride     PRN Meds: sodium chloride, acetaminophen, nitroGLYCERIN, ondansetron (ZOFRAN) IV, sodium chloride flush, zolpidem   Vital Signs    Vitals:   01/11/18 2105 01/11/18 2110 01/12/18 0443 01/12/18 0443  BP: (!) 145/88   138/90  Pulse: 78   70  Resp:      Temp:  98.1 F (36.7 C)  98.1 F (36.7 C)  TempSrc:  Oral  Oral  SpO2:  95%  96%  Weight:   161 lb 3.2 oz (73.1 kg)   Height:        Intake/Output Summary (Last 24 hours) at 01/12/2018 0819 Last data filed at 01/11/2018 1000 Gross per 24 hour  Intake -  Output 100 ml  Net -100 ml   Filed Weights   01/11/18 0500 01/11/18 1600 01/12/18 0443  Weight: 164 lb 0.4 oz (74.4 kg) 160 lb 14.4 oz (73 kg) 161 lb 3.2 oz (73.1 kg)    Telemetry    sinus - Personally Reviewed  ECG    No new tracings - Personally Reviewed  Physical Exam   GEN: No acute distress.   Neck: No JVD Cardiac: RRR, no murmurs, rubs, or gallops.  Respiratory: Clear to auscultation bilaterally, diminished in bases GI: Soft, nontender, non-distended  MS: No edema; No deformity. Neuro:  Nonfocal  Psych: Normal affect   Labs    Chemistry Recent Labs  Lab 01/10/18 0830 01/10/18 0835 01/11/18 0450  NA 140 139 139  K 3.1* 3.2* 4.1  CL 106 105 106  CO2 22  --  24  GLUCOSE 134* 145* 108*  BUN 14 16 11   CREATININE 0.87 0.90 0.91  CALCIUM 10.0   --  10.2  PROT 6.8  --   --   ALBUMIN 3.8  --   --   AST 17  --   --   ALT 18  --   --   ALKPHOS 118  --   --   BILITOT 0.8  --   --   GFRNONAA >60  --  >60  GFRAA >60  --  >60  ANIONGAP 12  --  9     Hematology Recent Labs  Lab 01/10/18 0835 01/11/18 0450  WBC  --  13.1*  RBC  --  5.10  HGB 15.3* 14.8  HCT 45.0 46.7*  MCV  --  91.6  MCH  --  29.0  MCHC  --  31.7  RDW  --  14.6  PLT  --  256    Cardiac Enzymes Recent Labs  Lab 01/10/18 1403 01/10/18 1904 01/10/18 2324  TROPONINI 42.58* 37.40* 22.26*    Recent Labs  Lab 01/10/18 0833  TROPIPOC 0.12*     BNPNo results for input(s): BNP, PROBNP in the last 168 hours.  DDimer No results for input(s): DDIMER in the last 168 hours.   Radiology    No results found.  Cardiac Studies   Left heart cath 01/10/18:  Mid RCA lesion is 75% stenosed.  Prox LAD lesion is 50% stenosed.  Mid LAD-1 lesion is 100% stenosed.  A drug-eluting stent was successfully placed using a STENT SYNERGY DES 3X24.  IVUS showed proximal edge dissection.  A drug-eluting stent was successfully placed using a STENT SYNERGY DES 3.5X16 to cover teh proximal edge of the above stent.  Post intervention, there is a 0% residual stenosis.  Mid Cx lesion is 60-70% stenosed.  LV end diastolic pressure is moderately elevated.  There is no aortic valve stenosis.  Significant tortuosity in the right subclavian made catheter torquing difficult for the RCA.   Significant tortuosity in the right subclavian made catheter torquing difficult for the RCA.    Recommend uninterrupted dual antiplatelet therapy with Aspirin 81mg  QD and Ticagrelor 90mg  BID for a minimum of 12 months (ACS - Class I recommendation).   Consider PCI of the RCA at a later time.  Elevated LVEDP.  She will likely need to recover from this anterior MI for a few weeks.  Consider femoral approach for the RCA PCI.    Echo 01/11/18: Study Conclusions - Left ventricle:  The cavity size was normal. There was moderate   concentric hypertrophy. Systolic function was normal. The   estimated ejection fraction was in the range of 55% to 60%. Wall   motion was normal; there were no regional wall motion   abnormalities. Doppler parameters are consistent with abnormal   left ventricular relaxation (grade 1 diastolic dysfunction).   Doppler parameters are consistent with elevated ventricular   end-diastolic filling pressure. - Ventricular septum: Septal motion showed paradox. - Aortic valve: Trileaflet; normal thickness leaflets. There was no   regurgitation. Valve area (VTI): 3.97 cm^2. Valve area (Vmax):   3.49 cm^2. Valve area (Vmean): 3.62 cm^2. - Mitral valve: There was mild regurgitation. - Right ventricle: The cavity size was normal. Wall thickness was   normal. Systolic function was normal. - Tricuspid valve: There was no regurgitation. - Pulmonary arteries: Systolic pressure could not be accurately   estimated. - Inferior vena cava: The vessel was normal in size. - Pericardium, extracardiac: There was no pericardial effusion.   Patient Profile     62 y.o. female without prior cardiac history who does have a history of tobacco abuse.  She is on no medications at home.  She developed chest pain early yesterday morning and was brought to [facility] by EMS where her EKG showed anterior ST segment elevation.  She was brought urgently to the Cath Lab by Dr. Irish Lack who performed PCI and I was guided drug-eluting stenting of her proximal LAD.  Her troponins rose to approximately 40.  She does have residual disease in her RCA and circumflex.  Assessment & Plan    1. Anterior STEMI, coronary artery disease - pt taken to cath lab emergently yesterday and received DES to mid LAD and a second stent to cover the proximal edge of the LAD stent - there was also 75% stenosis of the RCA that can be considered for treatment in the future - echo with normal LVEF and  grade 1 DD - pt is tolerating ASA and brilinta well - pt denies further chest pain and wants to discharge home today   2. HLD - 01/11/2018: Cholesterol 169; HDL 36; LDL Cholesterol 100; Triglycerides 165;  VLDL 33 - started high dose statin therapy - will need lipids and LFTs in 6 weeks - LDL goal less than 70   3. Current smoker - encouraged cessation   For questions or updates, please contact [place] Please consult [email or web address] for contact info under Cardiology/STEMI.      Signed, Tami Lin [place], PA  01/12/2018, 8:19 AM    Agree with note by Fabian Sharp PA-C  Patient is ready for discharge.  Postop day 2 anterior STEMI treated with IVIS guided proximal LAD PCI and overlapping drug-eluting stenting using 2 drug-eluting stents.  She did have moderately severe mid to distal dominant RCA stenosis.  LV function is normal by 2D echo.  She had no recurrent symptoms.  Her radial puncture site is somewhat ecchymotic but healed.  She is hemodynamically letter cardia graphically stable.  She is on appropriate guideline driven post MI pharmacology including DAPT, and high-dose statin therapy.  Plan will be for discharge today, outpatient follow-up with intent to perform staged RCA intervention. She has agreed to stop smoking.  Lorretta Harp, M.D., [place], [facility], [place] [address] [place], [place]  [address]  [phone] 01/12/2018 9:35 AM

## 2018-01-12 NOTE — Telephone Encounter (Signed)
-----   Message from Janan Ridge sent at 01/12/2018  8:43 AM EDT ----- Pt has a TCM appt on 01/12/18 at 11:30 with [place] per Janace Hoard

## 2018-01-12 NOTE — Discharge Instructions (Signed)

## 2018-01-12 NOTE — Progress Notes (Signed)
[id number] Pt dressed and ready to go home. Had not walked in hall again since we walked yesterday so we walked 470 ft on RA with steady gait and no CP. Tolerated well. Re inforced MI restrictions. Graylon Good RN BSN 01/12/2018 9:31 AM

## 2018-01-13 NOTE — Telephone Encounter (Signed)
Patient contacted regarding discharge from [facility] on 01/12/18.  Patient understands to follow up with provider Kerin Ransom, [place] on 01/19/18 at 50 at [facility]. Patient understands discharge instructions? yes Patient understands medications and regiment? yes Patient understands to bring all medications to this visit? yes

## 2018-01-19 ENCOUNTER — Encounter: Payer: Self-pay | Admitting: Cardiology

## 2018-01-19 ENCOUNTER — Ambulatory Visit (INDEPENDENT_AMBULATORY_CARE_PROVIDER_SITE_OTHER): Payer: No Typology Code available for payment source | Admitting: Cardiology

## 2018-01-19 VITALS — BP 144/84 | HR 77 | Ht 61.0 in | Wt 163.6 lb

## 2018-01-19 DIAGNOSIS — E785 Hyperlipidemia, unspecified: Secondary | ICD-10-CM

## 2018-01-19 DIAGNOSIS — I200 Unstable angina: Secondary | ICD-10-CM

## 2018-01-19 DIAGNOSIS — I10 Essential (primary) hypertension: Secondary | ICD-10-CM | POA: Diagnosis not present

## 2018-01-19 DIAGNOSIS — Z9861 Coronary angioplasty status: Secondary | ICD-10-CM

## 2018-01-19 DIAGNOSIS — F17200 Nicotine dependence, unspecified, uncomplicated: Secondary | ICD-10-CM | POA: Diagnosis not present

## 2018-01-19 DIAGNOSIS — I2109 ST elevation (STEMI) myocardial infarction involving other coronary artery of anterior wall: Secondary | ICD-10-CM | POA: Diagnosis not present

## 2018-01-19 DIAGNOSIS — Z79899 Other long term (current) drug therapy: Secondary | ICD-10-CM | POA: Diagnosis not present

## 2018-01-19 DIAGNOSIS — I2510 Atherosclerotic heart disease of native coronary artery without angina pectoris: Secondary | ICD-10-CM | POA: Insufficient documentation

## 2018-01-19 MED ORDER — METOPROLOL TARTRATE 25 MG PO TABS: 25.0000 mg | ORAL_TABLET | Freq: Two times a day (BID) | ORAL | 3 refills | Status: DC

## 2018-01-19 MED ORDER — LISINOPRIL 20 MG PO TABS: 20.0000 mg | ORAL_TABLET | Freq: Every day | ORAL | 3 refills | Status: DC

## 2018-01-19 NOTE — Progress Notes (Signed)
01/19/2018 Traci Fuller   12-Nov-1955  [id number]  Primary Physician Celene Squibb, MD Primary Cardiologist: Dr Gwenlyn Found  HPI:  Pleasant 62 y/o married Caucasian female, work for an Probation officer in [place], presented 01/10/18 with an anterior STEMI. She was taken to the lab and had a mLAD occlusion treated with DES. She has residual RCA disease of 75% and Dr Gwenlyn Found notes that this site may need intervention at a later date, for now medical Rx. The pt is in the office today for a post MI f/u. She is doing well, she went back to work but says she doesn't do anything physically stressful. She has not had recurrent chest pain. She did mention that her husband has been diagnosed with cancer and that has caused a good deal of stress for her. She has not smoked since her MI.    Current Outpatient Medications  Medication Sig Dispense Refill  . aspirin EC 81 MG EC tablet Take 1 tablet (81 mg total) by mouth daily. 90 tablet 3  . atorvastatin (LIPITOR) 80 MG tablet Take 1 tablet (80 mg total) by mouth daily at 6 PM. 90 tablet 3  . cetirizine (ZYRTEC) 10 MG tablet Take 10 mg by mouth daily as needed.     Marland Kitchen lisinopril (PRINIVIL,ZESTRIL) 20 MG tablet Take 1 tablet (20 mg total) by mouth daily. 90 tablet 3  . metoprolol tartrate (LOPRESSOR) 25 MG tablet Take 1 tablet (25 mg total) by mouth 2 (two) times daily. 180 tablet 3  . nitroGLYCERIN (NITROSTAT) 0.4 MG SL tablet Place 1 tablet (0.4 mg total) under the tongue every 5 (five) minutes x 3 doses as needed for chest pain. 25 tablet 1  . ticagrelor (BRILINTA) 90 MG TABS tablet Take 1 tablet (90 mg total) by mouth 2 (two) times daily. 180 tablet 3   No current facility-administered medications for this visit.     No Known Allergies  Past Medical History:  Diagnosis Date  . CAD (coronary artery disease)    anterior MI 12/2017 DES to midLAD, 75% stenosis of RCA not treated  . Current smoker   . Essential hypertension   . Hyperlipidemia LDL goal <70       Social History   Socioeconomic History  . Marital status: Married    Spouse name: Not on file  . Number of children: Not on file  . Years of education: Not on file  . Highest education level: Not on file  Occupational History  . Not on file  Social Needs  . Financial resource strain: Not on file  . Food insecurity:    Worry: Not on file    Inability: Not on file  . Transportation needs:    Medical: Not on file    Non-medical: Not on file  Tobacco Use  . Smoking status: Never Smoker  . Smokeless tobacco: Never Used  Substance and Sexual Activity  . Alcohol use: Yes  . Drug use: Never  . Sexual activity: Not on file  Lifestyle  . Physical activity:    Days per week: Not on file    Minutes per session: Not on file  . Stress: Not on file  Relationships  . Social connections:    Talks on phone: Not on file    Gets together: Not on file    Attends religious service: Not on file    Active member of club or organization: Not on file    Attends meetings of clubs or organizations: Not  on file    Relationship status: Not on file  . Intimate partner violence:    Fear of current or ex partner: Not on file    Emotionally abused: Not on file    Physically abused: Not on file    Forced sexual activity: Not on file  Other Topics Concern  . Not on file  Social History Narrative  . Not on file     Family History  Problem Relation Age of Onset  . Hypertension Father      Review of Systems: General: negative for chills, fever, night sweats or weight changes.  Cardiovascular: negative for chest pain, dyspnea on exertion, edema, orthopnea, palpitations, paroxysmal nocturnal dyspnea or shortness of breath Dermatological: negative for rash Respiratory: negative for cough or wheezing Urologic: negative for hematuria Abdominal: negative for nausea, vomiting, diarrhea, bright red blood per rectum, melena, or hematemesis Neurologic: negative for visual changes, syncope, or  dizziness All other systems reviewed and are otherwise negative except as noted above.    Blood pressure (!) 144/84, pulse 77, height 5\' 1"  (1.549 m), weight 163 lb 9.6 oz (74.2 kg).  General appearance: alert, cooperative and no distress Neck: no carotid bruit and no JVD Lungs: clear to auscultation bilaterally Heart: regular rate and rhythm Extremities: ecchymosis Rt radial site and forarm Skin: Skin color, texture, turgor normal. No rashes or lesions Neurologic: Grossly normal  EKG NSR, Anterior lateral TWI-HR 78  ASSESSMENT AND PLAN:   Acute ST elevation myocardial infarction (STEMI) of anterior wall (HCC) Pt presented 01/10/18 with an anterior STEMI  CAD S/P percutaneous coronary angioplasty mLAD PCI with DES 01/10/18. Residual 75% mRCA-consider PCI at a later date Normal LVF Consider using RFA approach secondary to tortuous Rt SCA if another PCI needed.   Hyperlipidemia LDL goal <70 On high dose statin Rx  Smoker Pt has not smoked since her MI  Essential hypertension Meds increased   PLAN  I increased her Lopressor to 25 mg BID and he Lisinopril to 20 mg daily. She can take OTC Protonix PRN. Check BMP as ACE is new. F/U with Dr Gwenlyn Found in a few weeks.   Kerin Ransom PA-C 01/19/2018 1:25 PM

## 2018-01-19 NOTE — Assessment & Plan Note (Signed)
Pt presented 01/10/18 with an anterior STEMI

## 2018-01-19 NOTE — Assessment & Plan Note (Signed)
On high dose statin Rx 

## 2018-01-19 NOTE — Assessment & Plan Note (Signed)
mLAD PCI with DES 01/10/18. Residual 75% mRCA-consider PCI at a later date Normal LVF Consider using RFA approach secondary to tortuous Rt SCA if another PCI needed.

## 2018-01-19 NOTE — Assessment & Plan Note (Signed)
Meds increased

## 2018-01-19 NOTE — Patient Instructions (Signed)
Medication Instructions:  1)INCREASE Lisinopril to 20 mg tablet by mouth ONCE daily 2) INCREASE Lopressor 25 mg tablet by mouth TWICE daily 3) You can start Protonix over the counter for GERD   Labwork: Your physician recommends that you return for lab work in: TODAY ([facility])    Testing/Procedures: none  Follow-Up: Keep scheduled follow-up with Dr. Gwenlyn Found on July 12th @ 11:15am  Any Other Special Instructions Will Be Listed Below (If Applicable).     If you need a refill on your cardiac medications before your next appointment, please call your pharmacy.

## 2018-01-19 NOTE — Assessment & Plan Note (Signed)
Pt has not smoked since her MI

## 2018-01-20 LAB — BASIC METABOLIC PANEL
BUN/Creatinine Ratio: 12 (ref 12–28)
BUN: 10 mg/dL (ref 8–27)
CO2: 24 mmol/L (ref 20–29)
Calcium: 10.3 mg/dL (ref 8.7–10.3)
Chloride: 103 mmol/L (ref 96–106)
Creatinine, Ser: 0.81 mg/dL (ref 0.57–1.00)
GFR calc Af Amer: 91 mL/min/{1.73_m2} (ref >59)
GFR calc non Af Amer: 79 mL/min/{1.73_m2} (ref >59)
Glucose: 78 mg/dL (ref 65–99)
Potassium: 4.9 mmol/L (ref 3.5–5.2)
Sodium: 142 mmol/L (ref 134–144)

## 2018-03-03 ENCOUNTER — Encounter: Payer: Self-pay | Admitting: Cardiovascular Disease

## 2018-03-03 ENCOUNTER — Ambulatory Visit (INDEPENDENT_AMBULATORY_CARE_PROVIDER_SITE_OTHER): Payer: No Typology Code available for payment source | Admitting: Cardiovascular Disease

## 2018-03-03 VITALS — BP 144/100 | HR 78 | Ht 61.0 in | Wt 166.0 lb

## 2018-03-03 DIAGNOSIS — Z9861 Coronary angioplasty status: Secondary | ICD-10-CM

## 2018-03-03 DIAGNOSIS — I2510 Atherosclerotic heart disease of native coronary artery without angina pectoris: Secondary | ICD-10-CM | POA: Diagnosis not present

## 2018-03-03 MED ORDER — METOPROLOL TARTRATE 25 MG PO TABS: 25.0000 mg | ORAL_TABLET | Freq: Two times a day (BID) | ORAL | 3 refills | Status: DC

## 2018-03-03 NOTE — Progress Notes (Signed)
03/03/2018 Traci Fuller   07/25/1956  [id number]  Primary Physician Celene Squibb, MD Primary Cardiologist: Lorretta Harp MD FACP, [place], [place], [place]  HPI:  Traci Fuller is a 62 y.o. mildly overweight married Caucasian female female mother of one biologic child, grandmother of 7 grandchildren who I am seeing back after her recent anterior MI on 01/10/2018.  Her primary care physician is Dr. Nevada Crane in [place].  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  Her risk factors include hypertension, hyperlipidemia and continued tobacco abuse.  She had anterior MI on 01/10/2018 underwent emergent cardiac catheterization radially by Dr. Irish Lack revealing occluded mid LAD which he placed 2 drug-eluting stents in.  She did have moderate disease in the AV groove circumflex and mid RCA.  She had no recurrent symptoms.  She does continue to smoke.  She is been compliant with her medications.  Dr. Irish Lack did mention that should she require further intervention she would be a femoral approach candidate because of tortuosity in her right subclavian and innominate arteries.   Current Meds  Medication Sig  . aspirin EC 81 MG EC tablet Take 1 tablet (81 mg total) by mouth daily.  Marland Kitchen atorvastatin (LIPITOR) 80 MG tablet Take 1 tablet (80 mg total) by mouth daily at 6 PM.  . cetirizine (ZYRTEC) 10 MG tablet Take 10 mg by mouth daily as needed.   Marland Kitchen lisinopril (PRINIVIL,ZESTRIL) 20 MG tablet Take 1 tablet (20 mg total) by mouth daily.  . metoprolol tartrate (LOPRESSOR) 25 MG tablet Take 1 tablet (25 mg total) by mouth 2 (two) times daily.  . nitroGLYCERIN (NITROSTAT) 0.4 MG SL tablet Place 1 tablet (0.4 mg total) under the tongue every 5 (five) minutes x 3 doses as needed for chest pain.  . ticagrelor (BRILINTA) 90 MG TABS tablet Take 1 tablet (90 mg total) by mouth 2 (two) times daily.     No Known Allergies  Social History   Socioeconomic History  . Marital status: Married    Spouse  name: Not on file  . Number of children: Not on file  . Years of education: Not on file  . Highest education level: Not on file  Occupational History  . Not on file  Social Needs  . Financial resource strain: Not on file  . Food insecurity:    Worry: Not on file    Inability: Not on file  . Transportation needs:    Medical: Not on file    Non-medical: Not on file  Tobacco Use  . Smoking status: Never Smoker  . Smokeless tobacco: Never Used  Substance and Sexual Activity  . Alcohol use: Yes  . Drug use: Never  . Sexual activity: Not on file  Lifestyle  . Physical activity:    Days per week: Not on file    Minutes per session: Not on file  . Stress: Not on file  Relationships  . Social connections:    Talks on phone: Not on file    Gets together: Not on file    Attends religious service: Not on file    Active member of club or organization: Not on file    Attends meetings of clubs or organizations: Not on file    Relationship status: Not on file  . Intimate partner violence:    Fear of current or ex partner: Not on file    Emotionally abused: Not on file    Physically abused: Not on file  Forced sexual activity: Not on file  Other Topics Concern  . Not on file  Social History Narrative  . Not on file     Review of Systems: General: negative for chills, fever, night sweats or weight changes.  Cardiovascular: negative for chest pain, dyspnea on exertion, edema, orthopnea, palpitations, paroxysmal nocturnal dyspnea or shortness of breath Dermatological: negative for rash Respiratory: negative for cough or wheezing Urologic: negative for hematuria Abdominal: negative for nausea, vomiting, diarrhea, bright red blood per rectum, melena, or hematemesis Neurologic: negative for visual changes, syncope, or dizziness All other systems reviewed and are otherwise negative except as noted above.    Blood pressure (!) 144/100, pulse 78, height 5\' 1"  (1.549 m), weight 166 lb  (75.3 kg).  General appearance: alert and no distress Neck: no adenopathy, no carotid bruit, no JVD, supple, symmetrical, trachea midline and thyroid not enlarged, symmetric, no tenderness/mass/nodules Lungs: clear to auscultation bilaterally Heart: regular rate and rhythm, S1, S2 normal, no murmur, click, rub or gallop Extremities: extremities normal, atraumatic, no cyanosis or edema Pulses: 2+ and symmetric Skin: Skin color, texture, turgor normal. No rashes or lesions Neurologic: Alert and oriented X 3, normal strength and tone. Normal symmetric reflexes. Normal coordination and gait  EKG not performed today  ASSESSMENT AND PLAN:   Acute ST elevation myocardial infarction (STEMI) of anterior wall ([place]) [name] had an anterior STEMI on 01/10/2018 with placement of 2 drug-eluting stents in her mid LAD by Dr. Irish Lack (3.0 by 24,3.5  x 16).  Aspirin and Brilinta.  She did have moderate disease in her RCA and circumflex.  I am going to check a formic logic Myoview stress test to assess physiologic significance.  Smoker History of continued tobacco abuse 1/2 pack/day smoker 45 years recalcitrant to risk factor modification.  Hyperlipidemia LDL goal <70 History of hyperlipidemia on statin therapy.  We will recheck a lipid and liver profile.  Essential hypertension History of essential hypertension with blood pressure measured today at 144/100.  She is on metoprolol and lisinopril although she ran out of her metoprolol several days ago.      Lorretta Harp MD FACP,FACC,FAHA, [facility] 03/03/2018 12:13 PM

## 2018-03-03 NOTE — Assessment & Plan Note (Signed)
History of hyperlipidemia on statin therapy. We will recheck a lipid and liver profile 

## 2018-03-03 NOTE — Assessment & Plan Note (Signed)
History of continued tobacco abuse 1/2 pack/day smoker 45 years recalcitrant to risk factor modification.

## 2018-03-03 NOTE — Patient Instructions (Signed)
Medication Instructions:   Refill sent to the pharmacy electronically.   Labwork:  Your physician recommends that you return for lab work PRIOR TO EATING  Testing/Procedures:  Your physician has requested that you have a lexiscan myoview. For further information please visit [email or web address]. Please follow instruction sheet, as given.    Follow-Up:  Your physician wants you to follow-up in: Kerin Ransom PA You will receive a reminder letter in the mail two months in advance. If you don't receive a letter, please call our office to schedule the follow-up appointment.   Your physician wants you to follow-up in: [place] will receive a reminder letter in the mail two months in advance. If you don't receive a letter, please call our office to schedule the follow-up appointment.   If you need a refill on your cardiac medications before your next appointment, please call your pharmacy.

## 2018-03-03 NOTE — Assessment & Plan Note (Signed)
History of essential hypertension with blood pressure measured today at 144/100.  She is on metoprolol and lisinopril although she ran out of her metoprolol several days ago.

## 2018-03-03 NOTE — Assessment & Plan Note (Signed)
Traci Fuller had an anterior STEMI on 01/10/2018 with placement of 2 drug-eluting stents in her mid LAD by Dr. Irish Lack (3.0 by 24,3.5  x 16).  Aspirin and Brilinta.  She did have moderate disease in her RCA and circumflex.  I am going to check a formic logic Myoview stress test to assess physiologic significance.

## 2018-03-09 ENCOUNTER — Encounter: Payer: Self-pay | Admitting: Cardiovascular Disease

## 2018-03-10 LAB — LIPID PANEL
CHOLESTEROL TOTAL: 89 mg/dL — AB (ref 100–199)
Chol/HDL Ratio: 2.5 ratio (ref 0.0–4.4)
HDL: 35 mg/dL — AB (ref >39)
LDL Calculated: 32 mg/dL (ref 0–99)
Triglycerides: 108 mg/dL (ref 0–149)
VLDL CHOLESTEROL CAL: 22 mg/dL (ref 5–40)

## 2018-03-10 LAB — HEPATIC FUNCTION PANEL
ALK PHOS: 185 IU/L — AB (ref 39–117)
ALT: 18 IU/L (ref 0–32)
AST: 23 IU/L (ref 0–40)
Albumin: 4.1 g/dL (ref 3.6–4.8)
Bilirubin Total: 0.6 mg/dL (ref 0.0–1.2)
Bilirubin, Direct: 0.16 mg/dL (ref 0.00–0.40)
TOTAL PROTEIN: 6.6 g/dL (ref 6.0–8.5)

## 2018-04-07 ENCOUNTER — Inpatient Hospital Stay (HOSPITAL_COMMUNITY): Admission: RE | Admit: 2018-04-07 | Payer: No Typology Code available for payment source | Source: Ambulatory Visit

## 2018-05-29 ENCOUNTER — Telehealth: Payer: Self-pay

## 2018-05-29 NOTE — Telephone Encounter (Signed)
New message    Just an FYI.  We have made several attempts to contact this patient including sending a letter to schedule or reschedule their MYOCARDIAL PERFUSION. We will be removing the patient from the WQ.   Thank you 

## 2018-12-18 ENCOUNTER — Other Ambulatory Visit: Payer: Self-pay

## 2018-12-18 MED ORDER — TICAGRELOR 90 MG PO TABS: 90.0000 mg | ORAL_TABLET | Freq: Two times a day (BID) | ORAL | 3 refills | Status: DC

## 2019-01-01 ENCOUNTER — Other Ambulatory Visit: Payer: Self-pay

## 2019-01-01 MED ORDER — ATORVASTATIN CALCIUM 80 MG PO TABS: 80.0000 mg | ORAL_TABLET | Freq: Every day | ORAL | 0 refills | Status: DC

## 2019-01-03 ENCOUNTER — Other Ambulatory Visit: Payer: Self-pay

## 2019-01-03 MED ORDER — ATORVASTATIN CALCIUM 80 MG PO TABS: 80.0000 mg | ORAL_TABLET | Freq: Every day | ORAL | 1 refills | Status: DC

## 2019-01-04 ENCOUNTER — Telehealth: Payer: Self-pay | Admitting: Cardiovascular Disease

## 2019-01-04 NOTE — Telephone Encounter (Signed)
Error

## 2019-02-17 ENCOUNTER — Other Ambulatory Visit: Payer: Self-pay | Admitting: Cardiovascular Disease

## 2019-02-17 DIAGNOSIS — I2510 Atherosclerotic heart disease of native coronary artery without angina pectoris: Secondary | ICD-10-CM

## 2019-05-17 ENCOUNTER — Other Ambulatory Visit: Payer: Self-pay | Admitting: Cardiovascular Disease

## 2019-05-17 DIAGNOSIS — Z9861 Coronary angioplasty status: Secondary | ICD-10-CM

## 2019-05-17 DIAGNOSIS — I2510 Atherosclerotic heart disease of native coronary artery without angina pectoris: Secondary | ICD-10-CM

## 2019-06-16 ENCOUNTER — Other Ambulatory Visit: Payer: Self-pay | Admitting: Cardiovascular Disease

## 2019-06-16 DIAGNOSIS — Z9861 Coronary angioplasty status: Secondary | ICD-10-CM

## 2019-06-16 DIAGNOSIS — I2510 Atherosclerotic heart disease of native coronary artery without angina pectoris: Secondary | ICD-10-CM

## 2019-07-03 ENCOUNTER — Other Ambulatory Visit: Payer: Self-pay

## 2019-07-03 ENCOUNTER — Ambulatory Visit (INDEPENDENT_AMBULATORY_CARE_PROVIDER_SITE_OTHER): Payer: No Typology Code available for payment source | Admitting: Otolaryngology

## 2019-07-03 ENCOUNTER — Encounter (INDEPENDENT_AMBULATORY_CARE_PROVIDER_SITE_OTHER): Payer: Self-pay | Admitting: Otolaryngology

## 2019-07-03 VITALS — Temp 97.9°F

## 2019-07-03 DIAGNOSIS — B0229 Other postherpetic nervous system involvement: Secondary | ICD-10-CM

## 2019-07-03 DIAGNOSIS — H608X3 Other otitis externa, bilateral: Secondary | ICD-10-CM

## 2019-07-03 MED ORDER — FLUOCINOLONE ACETONIDE 0.01 % OT OIL: 4.0000 [drp] | TOPICAL_OIL | Freq: Every day | OTIC | 3 refills | Status: AC | PRN

## 2019-07-03 NOTE — Progress Notes (Signed)
HPI: Traci Fuller is a 63 y.o. female who presents for evaluation of right nasal burning and pain. Patient states that she was diagnosed and treated for Shingles in her right nostril and over her right face four months ago in July with steroids and anti-viral medication. Symptoms improved but a month later, she started having burning in her right nostril intermittently. The burning would come on with blowing her nose, eating, brushing her right upper teeth and at certain random times during the day. She describes it as sharp or burning, and radiating through her nose in to her right eye at times. She has tried Mucinex, Flonase and Doxycycline without benefit to the pain. Her humidifier does help some. She denies any nasal congestion, mucous drainage or difficulty breathing through her nose. Patient also complains of chronically itchy ears. She has tried an OTC ear drop without benefit. She does have allergies for which she uses Flonase and an OTC anti-histamine.  Past Medical History:  Diagnosis Date  . CAD (coronary artery disease)    anterior MI 12/2017 DES to midLAD, 75% stenosis of RCA not treated  . Current smoker   . Essential hypertension   . Hyperlipidemia LDL goal <70    Past Surgical History:  Procedure Laterality Date  . ABDOMINAL HYSTERECTOMY    . CORONARY STENT INTERVENTION N/A 01/10/2018   Procedure: CORONARY STENT INTERVENTION;  Surgeon: Jettie Booze, MD;  Location: [facility];  Service: Cardiovascular;  Laterality: N/A;  . LEFT HEART CATH AND CORONARY ANGIOGRAPHY N/A 01/10/2018   Procedure: LEFT HEART CATH AND CORONARY ANGIOGRAPHY;  Surgeon: Jettie Booze, MD;  Location: [facility];  Service: Cardiovascular;  Laterality: N/A;   Social History   Socioeconomic History  . Marital status: Married    Spouse name: Not on file  . Number of children: Not on file  . Years of education: Not on file  . Highest education level: Not on file  Occupational  History  . Not on file  Social Needs  . Financial resource strain: Not on file  . Food insecurity    Worry: Not on file    Inability: Not on file  . Transportation needs    Medical: Not on file    Non-medical: Not on file  Tobacco Use  . Smoking status: Current Every Day Smoker    Packs/day: 5.00    Years: 43.00    Pack years: 215.00    Types: Cigarettes    Start date: 59  . Smokeless tobacco: Never Used  Substance and Sexual Activity  . Alcohol use: Yes  . Drug use: Never  . Sexual activity: Not on file  Lifestyle  . Physical activity    Days per week: Not on file    Minutes per session: Not on file  . Stress: Not on file  Relationships  . Social Herbalist on phone: Not on file    Gets together: Not on file    Attends religious service: Not on file    Active member of club or organization: Not on file    Attends meetings of clubs or organizations: Not on file    Relationship status: Not on file  Other Topics Concern  . Not on file  Social History Narrative  . Not on file   Family History  Problem Relation Age of Onset  . Hypertension Father    No Known Allergies Prior to Admission medications   Medication Sig Start Date  End Date Taking? Authorizing Provider  aspirin EC 81 MG EC tablet Take 1 tablet (81 mg total) by mouth daily. 01/12/18  Yes Duke, Tami Lin, PA  atorvastatin (LIPITOR) 80 MG tablet Take 1 tablet (80 mg total) by mouth daily at 6 PM. 01/03/19  Yes Duke, Tami Lin, PA  cetirizine (ZYRTEC) 10 MG tablet Take 10 mg by mouth daily as needed.    Yes [provider]  lisinopril (PRINIVIL,ZESTRIL) 20 MG tablet Take 1 tablet (20 mg total) by mouth daily. 01/19/18  Yes Kilroy, Luke K, PA-C  metoprolol tartrate (LOPRESSOR) 25 MG tablet TAKE 1 TABLET BY MOUTH TWICE DAILY 06/18/19  Yes Lorretta Harp, MD  nitroGLYCERIN (NITROSTAT) 0.4 MG SL tablet Place 1 tablet (0.4 mg total) under the tongue every 5 (five) minutes x 3 doses as  needed for chest pain. 01/12/18  Yes Duke, Tami Lin, PA  ticagrelor (BRILINTA) 90 MG TABS tablet Take 1 tablet (90 mg total) by mouth 2 (two) times daily. 12/18/18  Yes Duke, Tami Lin, PA     Positive ROS: negative for nasal congestion, fever  All other systems have been reviewed and were otherwise negative with the exception of those mentioned in the HPI and as above.  Physical Exam: General: Alert, no acute distress Ears: Ear canals are dry but clear bilaterally with intact, clear TMs.  Nasal: Mild rhinitis but otherwise clear nasal passages; bilateral middle meatus clear without polyps, masses or lesions appreciated.  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 No evidence of infection, masses or polyps in nose. I believe that burning sensation is related to patient's Shingles and she is dealing with a post-herpetic neuralgia. Recommended trial of Gabapentin 300mg  TID. If medication helps, recommend she follow up with her PCP. Continue use of Flonase. For patient's itchy ears, prescribed Fluocinolone ear drops x3 days PRN itching. She will return here PRN.  Pax Reasoner, PA-C  I have personally seen and examined this patient. I agree with the assessment and plan as outlined above. Radene Journey, MD

## 2019-07-05 ENCOUNTER — Telehealth (INDEPENDENT_AMBULATORY_CARE_PROVIDER_SITE_OTHER): Payer: Self-pay

## 2019-07-16 ENCOUNTER — Other Ambulatory Visit: Payer: Self-pay

## 2019-07-16 MED ORDER — ATORVASTATIN CALCIUM 80 MG PO TABS: 80.0000 mg | ORAL_TABLET | Freq: Every day | ORAL | 0 refills | Status: DC

## 2019-08-28 ENCOUNTER — Ambulatory Visit: Payer: No Typology Code available for payment source | Attending: Internal Medicine

## 2019-08-28 ENCOUNTER — Other Ambulatory Visit: Payer: Self-pay

## 2019-08-28 DIAGNOSIS — Z20822 Contact with and (suspected) exposure to covid-19: Secondary | ICD-10-CM

## 2019-08-30 LAB — NOVEL CORONAVIRUS, NAA: SARS-CoV-2, NAA: DETECTED — AB

## 2019-09-10 ENCOUNTER — Other Ambulatory Visit: Payer: Self-pay | Admitting: Cardiovascular Disease

## 2019-09-10 DIAGNOSIS — Z9861 Coronary angioplasty status: Secondary | ICD-10-CM

## 2019-09-10 DIAGNOSIS — I2510 Atherosclerotic heart disease of native coronary artery without angina pectoris: Secondary | ICD-10-CM

## 2019-10-07 ENCOUNTER — Other Ambulatory Visit: Payer: Self-pay | Admitting: Cardiovascular Disease

## 2019-10-07 DIAGNOSIS — I2510 Atherosclerotic heart disease of native coronary artery without angina pectoris: Secondary | ICD-10-CM

## 2019-10-23 ENCOUNTER — Other Ambulatory Visit: Payer: Self-pay | Admitting: Cardiovascular Disease

## 2019-10-23 DIAGNOSIS — Z9861 Coronary angioplasty status: Secondary | ICD-10-CM

## 2019-10-23 DIAGNOSIS — I2510 Atherosclerotic heart disease of native coronary artery without angina pectoris: Secondary | ICD-10-CM

## 2019-12-03 ENCOUNTER — Other Ambulatory Visit: Payer: Self-pay

## 2020-03-17 ENCOUNTER — Other Ambulatory Visit: Payer: Self-pay

## 2021-08-31 ENCOUNTER — Other Ambulatory Visit (HOSPITAL_COMMUNITY): Payer: Self-pay | Admitting: Family Medicine

## 2021-08-31 DIAGNOSIS — Z1231 Encounter for screening mammogram for malignant neoplasm of breast: Secondary | ICD-10-CM

## 2021-09-03 ENCOUNTER — Other Ambulatory Visit (HOSPITAL_COMMUNITY): Payer: Self-pay | Admitting: Family Medicine

## 2021-09-08 ENCOUNTER — Other Ambulatory Visit (HOSPITAL_COMMUNITY): Payer: Self-pay | Admitting: Family Medicine

## 2021-09-08 DIAGNOSIS — N6320 Unspecified lump in the left breast, unspecified quadrant: Secondary | ICD-10-CM

## 2021-09-10 ENCOUNTER — Ambulatory Visit (HOSPITAL_COMMUNITY)
Admission: RE | Admit: 2021-09-10 | Discharge: 2021-09-10 | Disposition: A | Discharge status: Home / Self Care | Payer: Managed Care, Other (non HMO) | Source: Ambulatory Visit | Attending: Family Medicine | Admitting: Family Medicine

## 2021-09-10 ENCOUNTER — Other Ambulatory Visit (HOSPITAL_COMMUNITY): Payer: Self-pay | Admitting: Family Medicine

## 2021-09-10 ENCOUNTER — Other Ambulatory Visit: Payer: Self-pay

## 2021-09-10 DIAGNOSIS — N6320 Unspecified lump in the left breast, unspecified quadrant: Secondary | ICD-10-CM

## 2021-09-10 DIAGNOSIS — R928 Other abnormal and inconclusive findings on diagnostic imaging of breast: Secondary | ICD-10-CM

## 2021-09-10 IMAGING — US US BREAST*R* LIMITED INC AXILLA
1 series · 12 of 25 positions shown · non-contrast
Comparison: None.

CLINICAL DATA: Patient presents with a firm palpable mass in the
left breast.



[Series 1: us breast ltd uni right inc axilla · 12 of 29 slices shown]
[im 2/29]
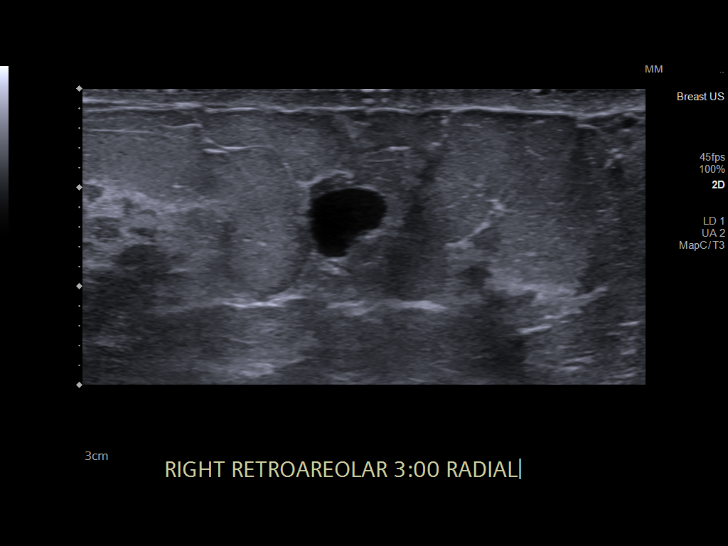
[im 4/29]
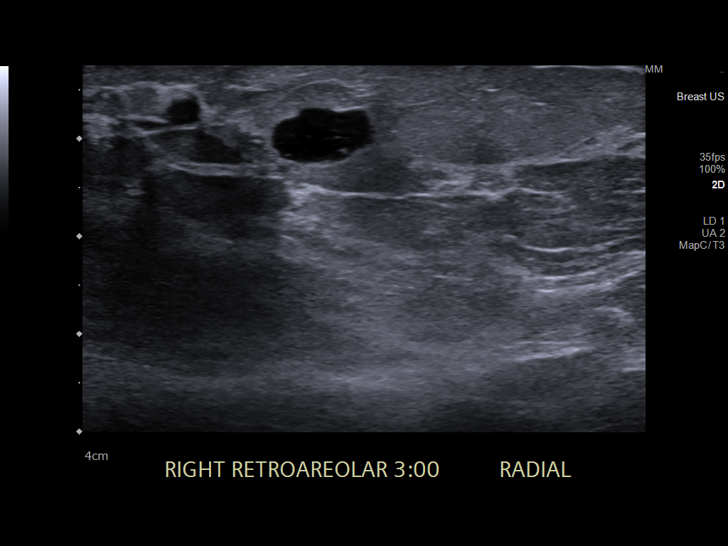
[im 6/29]
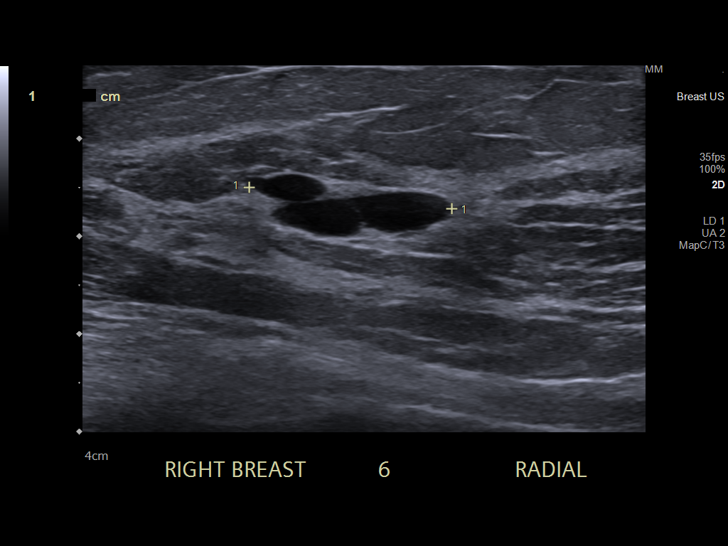
[im 9/29]
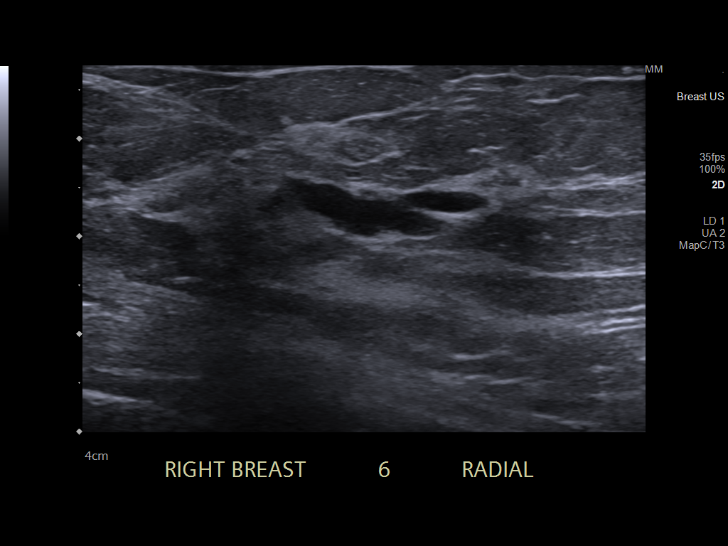
[im 11/29]
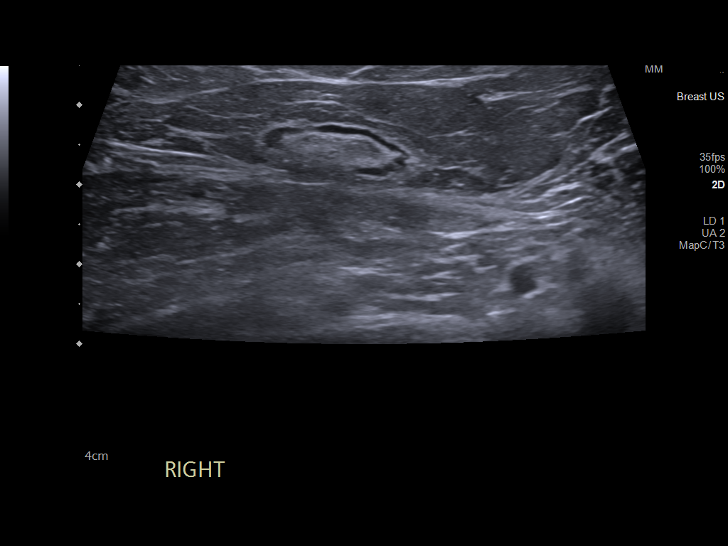
[im 13/29]
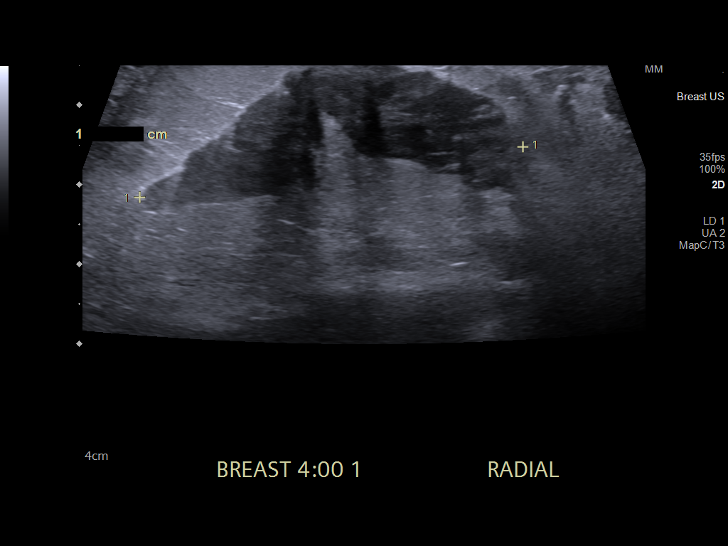
[im 16/29]
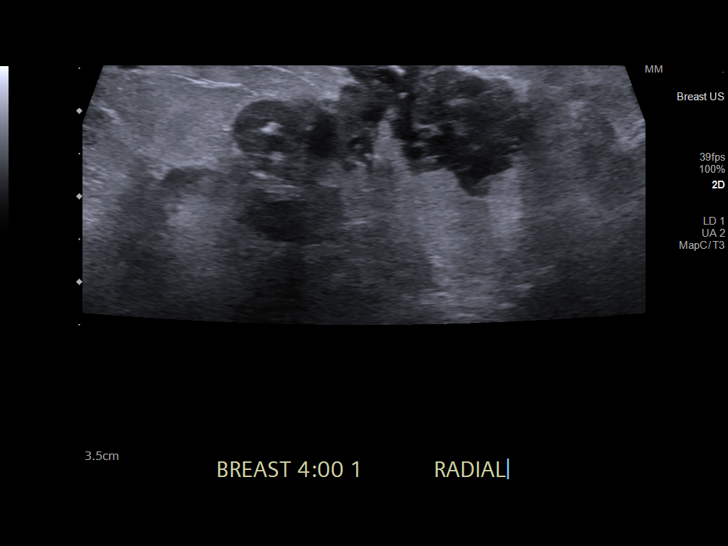
[im 18/29]
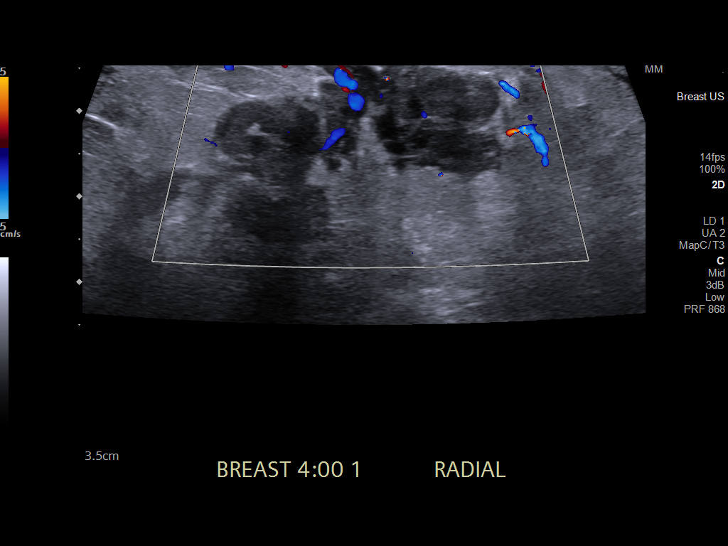
[im 20/29]
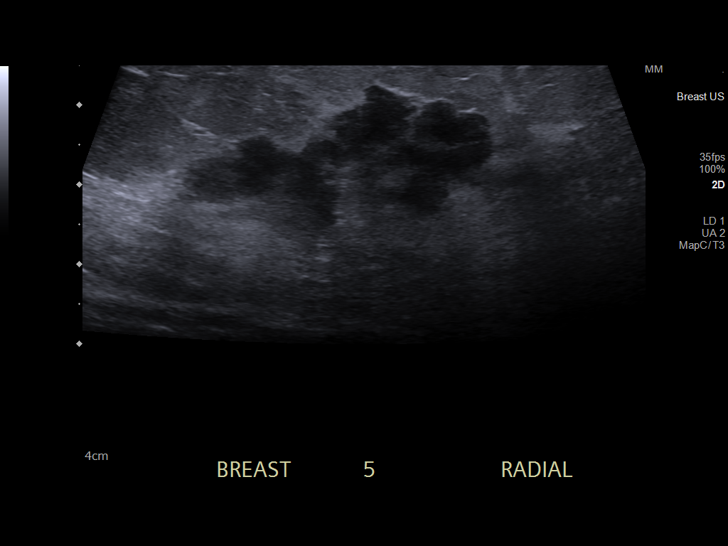
[im 23/29]
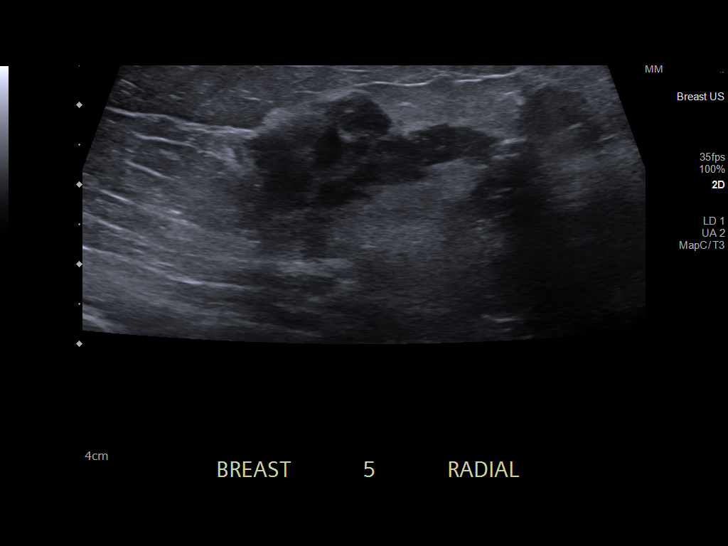
[im 25/29]
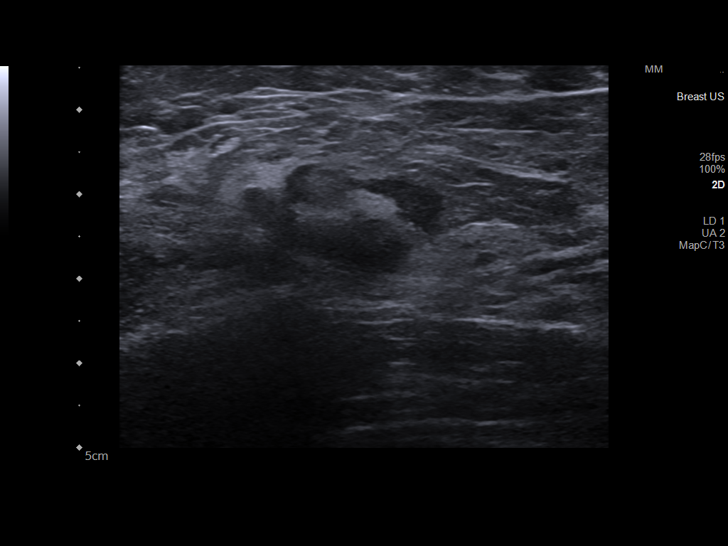
[im 27/29]
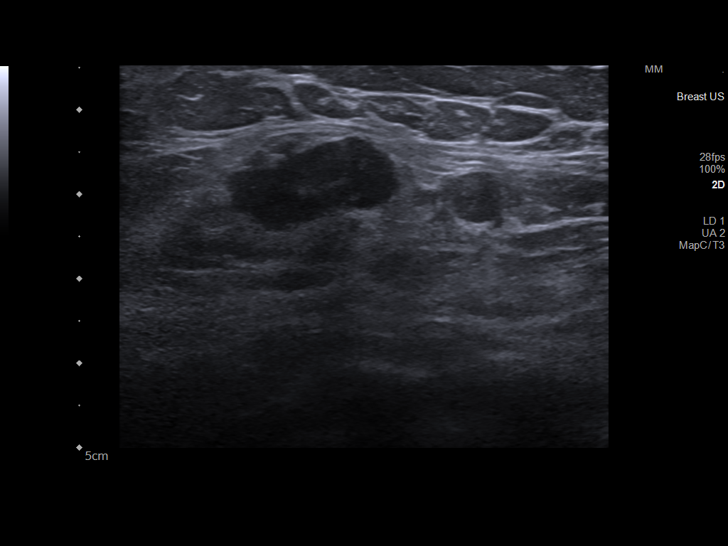

[12 of 25 positions shown; findings below may reference images not displayed]

ACR Breast Density Category c: The breast tissue is heterogeneously
dense, which may obscure small masses.
FINDINGS: In the left breast, a conglomeration of ill-defined masses with
associated pleomorphic calcifications extends from the retroareolar
upper outer quadrant for approximately 8 cm anterior to posterior.
Within this conglomeration is a more defined mass measuring 2 cm.
There is associated architectural distortion. Posterior to the mass
is asymmetric density that extends towards the pectoralis muscle. In
addition, posterior to the conglomeration of masses is a second
conglomeration of ill-defined masses without associated
calcifications, which extend towards the left axilla, spanning
approximately 4 cm in long axis. There is also anterior breast skin
thickening. There is a questionable thickened abnormal lymph node in
the left axilla.

In the right breast, there is a mass measuring 8 mm that projects
just medial to the nipple in the anterior breast. In the upper outer
breast, more posteriorly, there is a partly obscured mass or masses.
Extending from the nipple along the lateral breast are coarse
heterogeneous calcifications, which span approximately 2.3 x 1.0 cm
on the cc view. There is no defined associated mass or distortion.
There is a questionable abnormal lymph node in the right axilla.

On physical exam, there is a firm mass that bulges the contour of
the overlying skin along the lateral left breast.

Targeted left breast ultrasound is performed, showing a large
irregular mass, difficult to accurately measure, in the left breast
at 4 o'clock, 1 cm from the nipple, spanning approximately 5 x by 2
x 4 cm, with echogenic foci consistent with calcifications. At 12
o'clock, 5 cm the nipple, there is an irregular mass measuring 3.9 x
2.0 x 3.1 cm. At 2 o'clock, 7 cm the nipple, there are 2 adjacent
irregular masses measuring 1.50.9 cm.

Sonographic evaluation of the left axilla demonstrates 3 abnormal
lymph nodes, the most abnormal showing a focal cortical thickening
of 7 mm.

Targeted ultrasound is performed, showing a cyst in the 3 o'clock
retroareolar breast measuring 11 x 7 x 8 mm. In the right breast at
10 o'clock, 6 cm the nipple, there is an elongated cyst with 2 thin
septations versus contiguous cysts that span 2.1 x 0.5 x 2.1 cm.
These cysts are consistent size and location to the mammographic
masses.

Sonographic evaluation the right axilla shows normal lymph nodes. No
thickened or abnormal nodes.
IMPRESSION: 1. Multiple left breast masses, the largest associated with
pleomorphic calcifications and architectural distortion, highly
suspicious for multifocal breast carcinoma.
2. Three abnormal left axillary lymph nodes suspicious for
metastatic lymphadenopathy.
3. Coarse heterogeneous right breast calcifications extending
laterally from the nipple spanning 2.3 cm in long axis.
4. Benign right breast cysts. No abnormal right axillary lymph
nodes.

RECOMMENDATION:
1. Ultrasound-guided core needle biopsies of the left breast at 3
sites. Ultrasound biopsy of the dominant palpable mass at 4 o'clock,
1 cm from the nipple. Ultrasound-guided core needle biopsy of 1 of
the additional masses more peripherally-recommend 1 of the 2 masses
at 2 o'clock, 7 cm from the nipple. Ultrasound-guided core needle
biopsy the dominant abnormal left axillary lymph node.
2. Stereotactic core needle biopsy of the right breast
calcifications.

The patient will be scheduled for the ultrasound-guided left breast
core needle biopsies prior to being discharged from the ultrasound
department.

I have discussed the findings and recommendations with the patient.
If applicable, a reminder letter will be sent to the patient
regarding the next appointment.

BI-RADS CATEGORY  5: Highly suggestive of malignancy.

## 2021-09-10 IMAGING — MG DIGITAL DIAGNOSTIC BILAT W/ TOMO W/ CAD
7 of 14 series · 7 of 38 positions shown · non-contrast
Comparison: None.

CLINICAL DATA: Patient presents with a firm palpable mass in the
left breast.



[R ML]
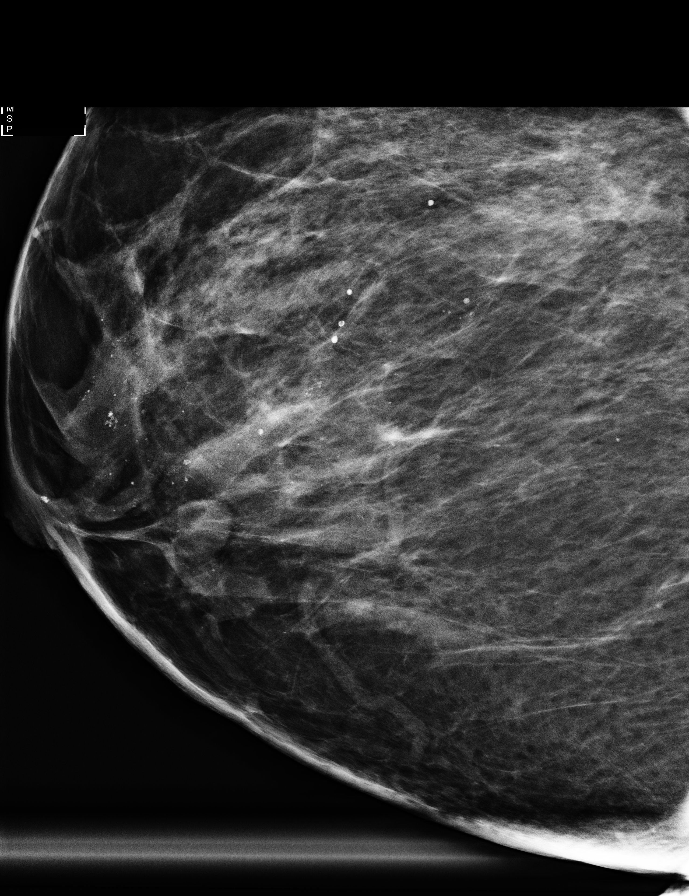

[R CC]
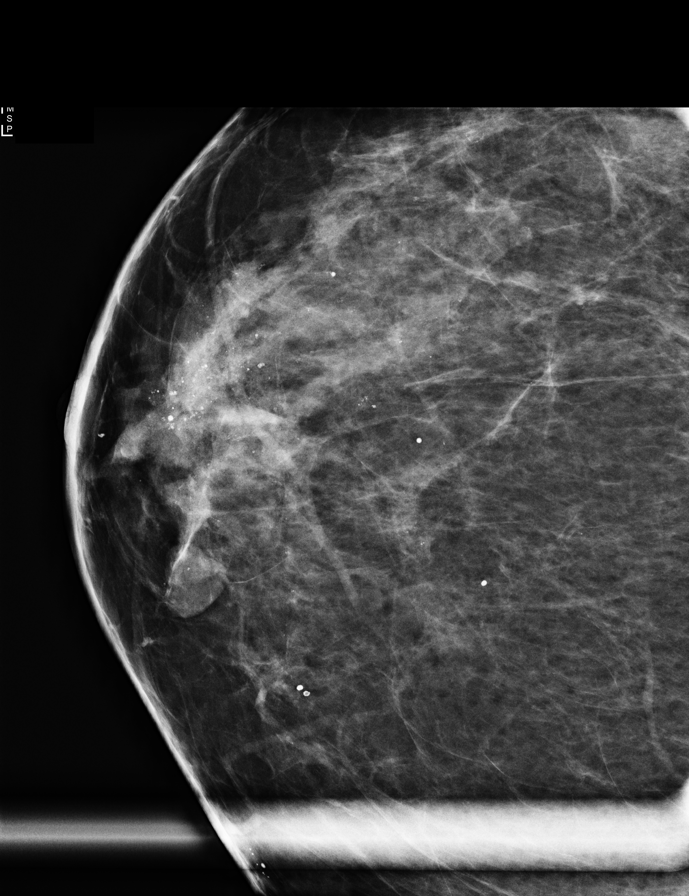

[L CC synth-2D (1 of 2)]
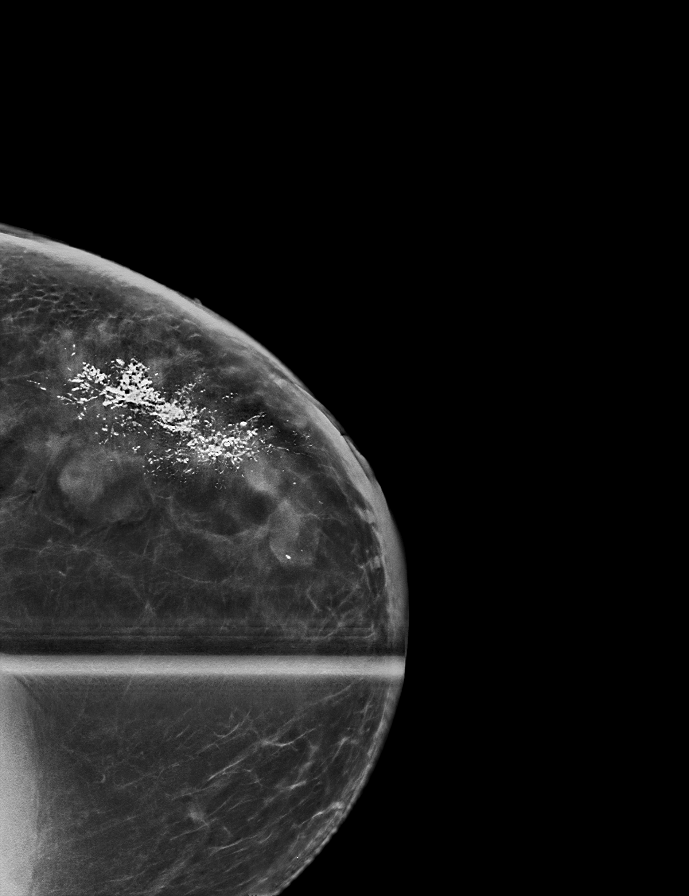

[R MLO synth-2D]
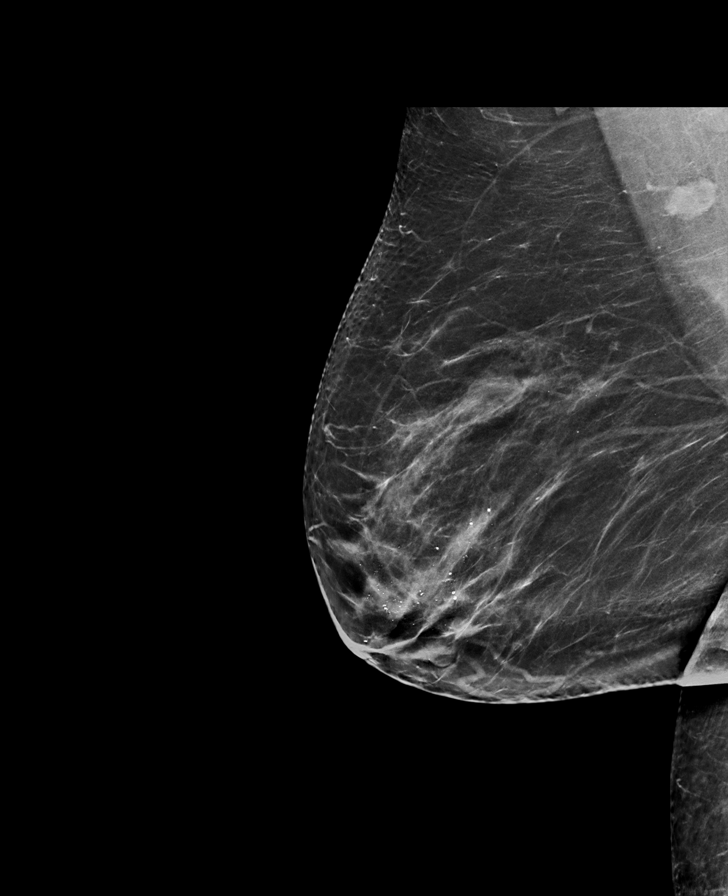

[R CC synth-2D]
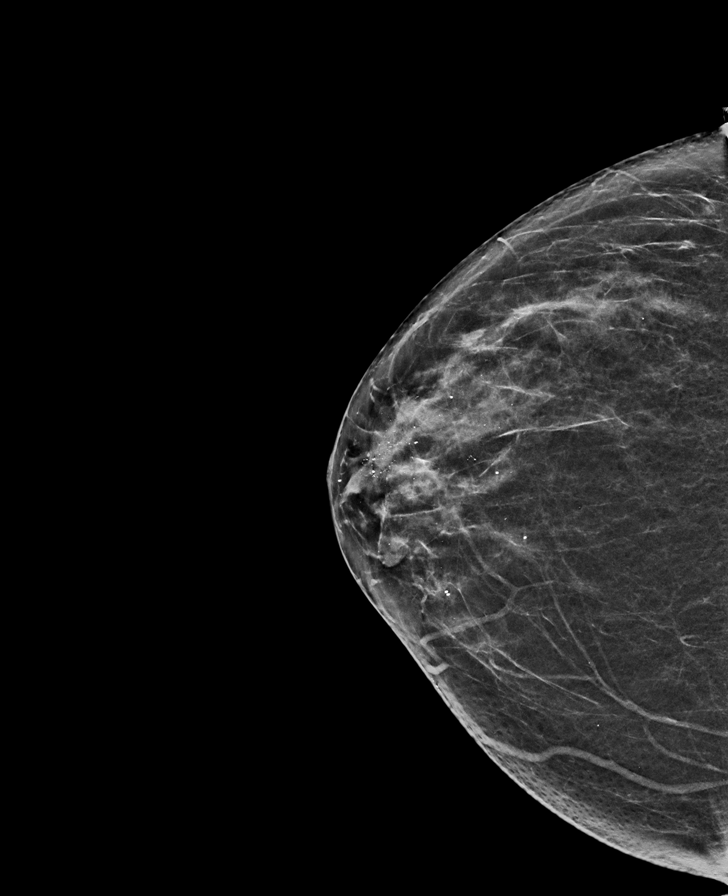

[L CC synth-2D (2 of 2)]
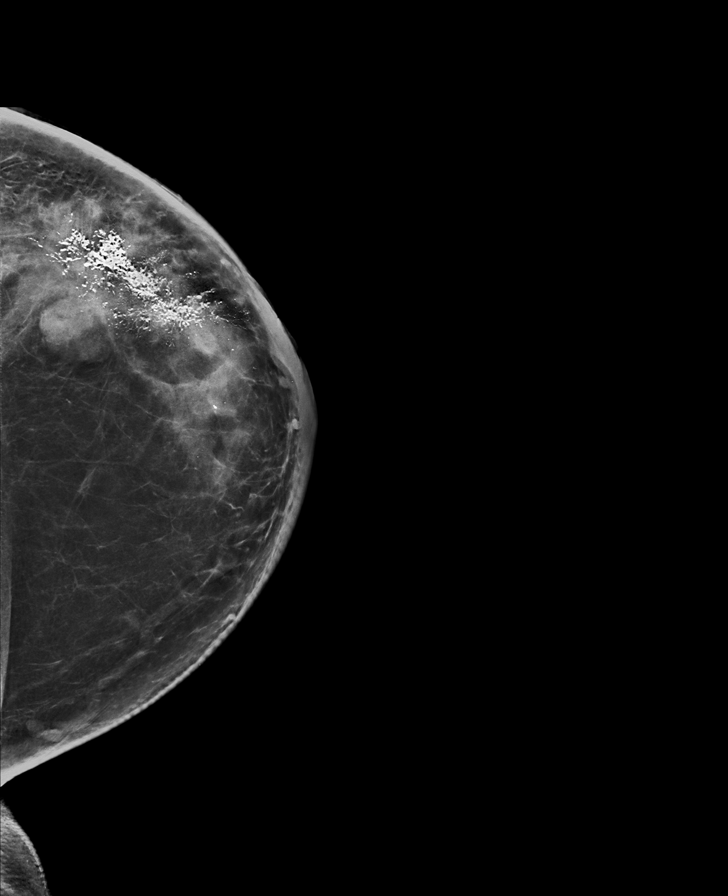

[R ML synth-2D]
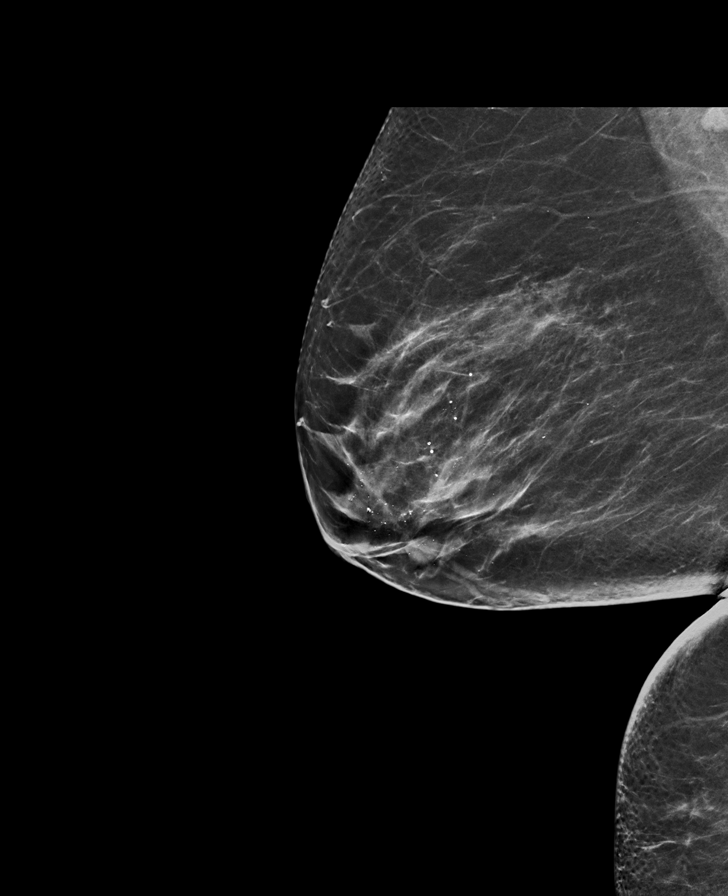

[7 of 38 positions shown; findings below may reference images not displayed]

ACR Breast Density Category c: The breast tissue is heterogeneously
dense, which may obscure small masses.
FINDINGS: In the left breast, a conglomeration of ill-defined masses with
associated pleomorphic calcifications extends from the retroareolar
upper outer quadrant for approximately 8 cm anterior to posterior.
Within this conglomeration is a more defined mass measuring 2 cm.
There is associated architectural distortion. Posterior to the mass
is asymmetric density that extends towards the pectoralis muscle. In
addition, posterior to the conglomeration of masses is a second
conglomeration of ill-defined masses without associated
calcifications, which extend towards the left axilla, spanning
approximately 4 cm in long axis. There is also anterior breast skin
thickening. There is a questionable thickened abnormal lymph node in
the left axilla.

In the right breast, there is a mass measuring 8 mm that projects
just medial to the nipple in the anterior breast. In the upper outer
breast, more posteriorly, there is a partly obscured mass or masses.
Extending from the nipple along the lateral breast are coarse
heterogeneous calcifications, which span approximately 2.3 x 1.0 cm
on the cc view. There is no defined associated mass or distortion.
There is a questionable abnormal lymph node in the right axilla.

On physical exam, there is a firm mass that bulges the contour of
the overlying skin along the lateral left breast.

Targeted left breast ultrasound is performed, showing a large
irregular mass, difficult to accurately measure, in the left breast
at 4 o'clock, 1 cm from the nipple, spanning approximately 5 x by 2
x 4 cm, with echogenic foci consistent with calcifications. At 12
o'clock, 5 cm the nipple, there is an irregular mass measuring 3.9 x
2.0 x 3.1 cm. At 2 o'clock, 7 cm the nipple, there are 2 adjacent
irregular masses measuring 1.50.9 cm.

Sonographic evaluation of the left axilla demonstrates 3 abnormal
lymph nodes, the most abnormal showing a focal cortical thickening
of 7 mm.

Targeted ultrasound is performed, showing a cyst in the 3 o'clock
retroareolar breast measuring 11 x 7 x 8 mm. In the right breast at
10 o'clock, 6 cm the nipple, there is an elongated cyst with 2 thin
septations versus contiguous cysts that span 2.1 x 0.5 x 2.1 cm.
These cysts are consistent size and location to the mammographic
masses.

Sonographic evaluation the right axilla shows normal lymph nodes. No
thickened or abnormal nodes.
IMPRESSION: 1. Multiple left breast masses, the largest associated with
pleomorphic calcifications and architectural distortion, highly
suspicious for multifocal breast carcinoma.
2. Three abnormal left axillary lymph nodes suspicious for
metastatic lymphadenopathy.
3. Coarse heterogeneous right breast calcifications extending
laterally from the nipple spanning 2.3 cm in long axis.
4. Benign right breast cysts. No abnormal right axillary lymph
nodes.

RECOMMENDATION:
1. Ultrasound-guided core needle biopsies of the left breast at 3
sites. Ultrasound biopsy of the dominant palpable mass at 4 o'clock,
1 cm from the nipple. Ultrasound-guided core needle biopsy of 1 of
the additional masses more peripherally-recommend 1 of the 2 masses
at 2 o'clock, 7 cm from the nipple. Ultrasound-guided core needle
biopsy the dominant abnormal left axillary lymph node.
2. Stereotactic core needle biopsy of the right breast
calcifications.

The patient will be scheduled for the ultrasound-guided left breast
core needle biopsies prior to being discharged from the ultrasound
department.

I have discussed the findings and recommendations with the patient.
If applicable, a reminder letter will be sent to the patient
regarding the next appointment.

BI-RADS CATEGORY  5: Highly suggestive of malignancy.

## 2021-09-16 ENCOUNTER — Other Ambulatory Visit: Payer: Self-pay | Admitting: Family Medicine

## 2021-09-16 ENCOUNTER — Other Ambulatory Visit (HOSPITAL_COMMUNITY): Payer: Self-pay | Admitting: Family Medicine

## 2021-09-16 DIAGNOSIS — R921 Mammographic calcification found on diagnostic imaging of breast: Secondary | ICD-10-CM

## 2021-09-16 DIAGNOSIS — R928 Other abnormal and inconclusive findings on diagnostic imaging of breast: Secondary | ICD-10-CM

## 2021-09-24 ENCOUNTER — Ambulatory Visit (HOSPITAL_COMMUNITY)
Admission: RE | Admit: 2021-09-24 | Discharge: 2021-09-24 | Disposition: A | Discharge status: Home / Self Care | Payer: Managed Care, Other (non HMO) | Source: Ambulatory Visit | Attending: Family Medicine | Admitting: Family Medicine

## 2021-09-24 ENCOUNTER — Other Ambulatory Visit (HOSPITAL_COMMUNITY): Payer: Self-pay | Admitting: Internal Medicine

## 2021-09-24 ENCOUNTER — Ambulatory Visit (HOSPITAL_COMMUNITY)
Admission: RE | Admit: 2021-09-24 | Discharge: 2021-09-24 | Disposition: A | Discharge status: Home / Self Care | Payer: Managed Care, Other (non HMO) | Source: Ambulatory Visit | Attending: Internal Medicine | Admitting: Internal Medicine

## 2021-09-24 ENCOUNTER — Other Ambulatory Visit: Payer: Self-pay

## 2021-09-24 ENCOUNTER — Encounter (HOSPITAL_COMMUNITY): Payer: Self-pay

## 2021-09-24 DIAGNOSIS — R928 Other abnormal and inconclusive findings on diagnostic imaging of breast: Secondary | ICD-10-CM

## 2021-09-24 IMAGING — MG MM BREAST LOCALIZATION CLIP
4 series · 4 of 12 positions shown · non-contrast
Comparison: Previous exam(s).

CLINICAL DATA: Post biopsy mammogram of the left breast for clip
placement.

EXAM:
3D DIAGNOSTIC LEFT MAMMOGRAM POST ULTRASOUND BIOPSY

[L ML synth-2D]
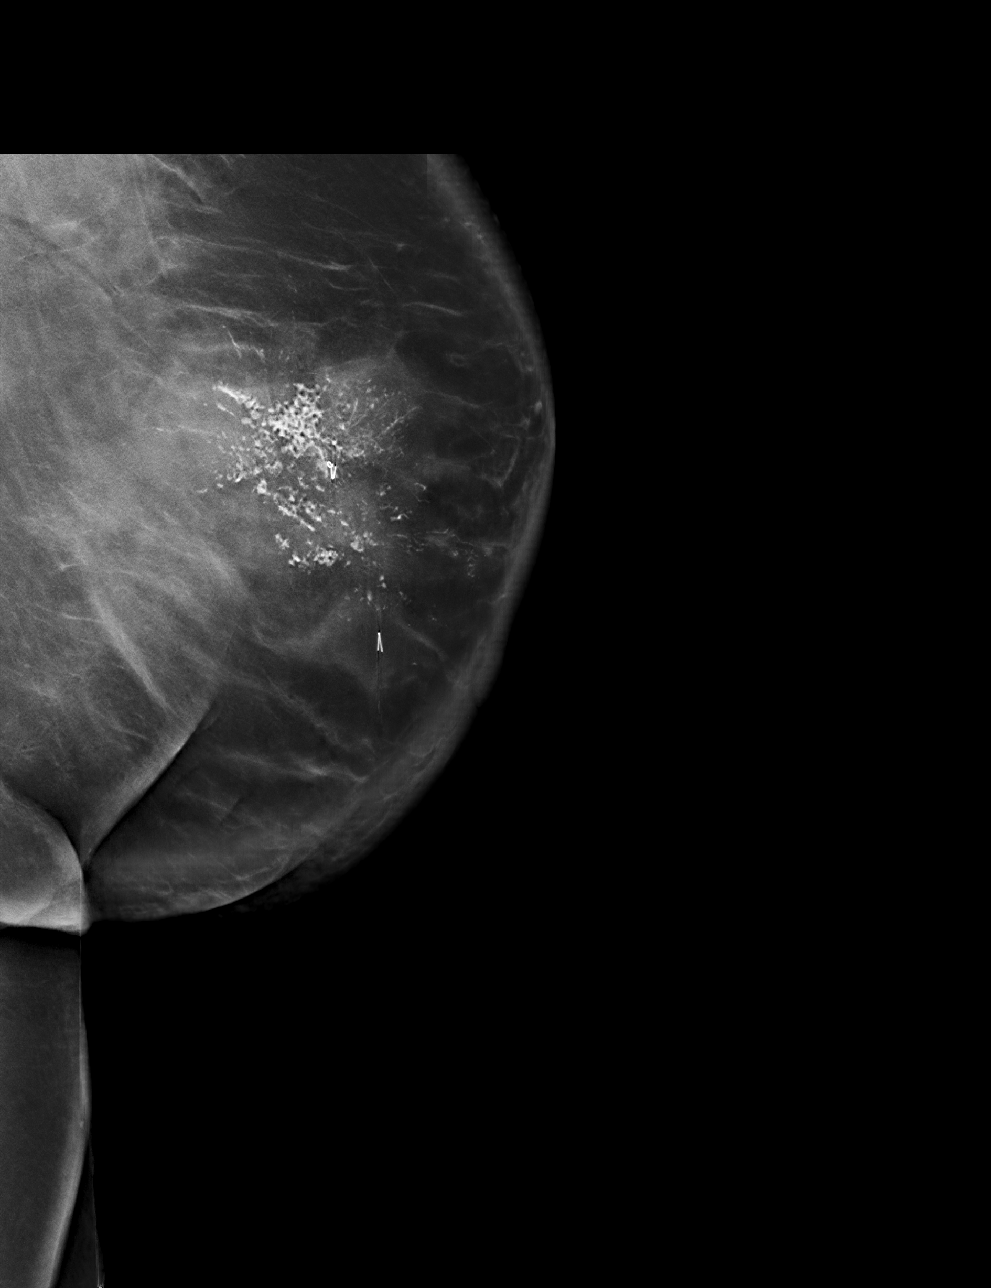

[L CC synth-2D]
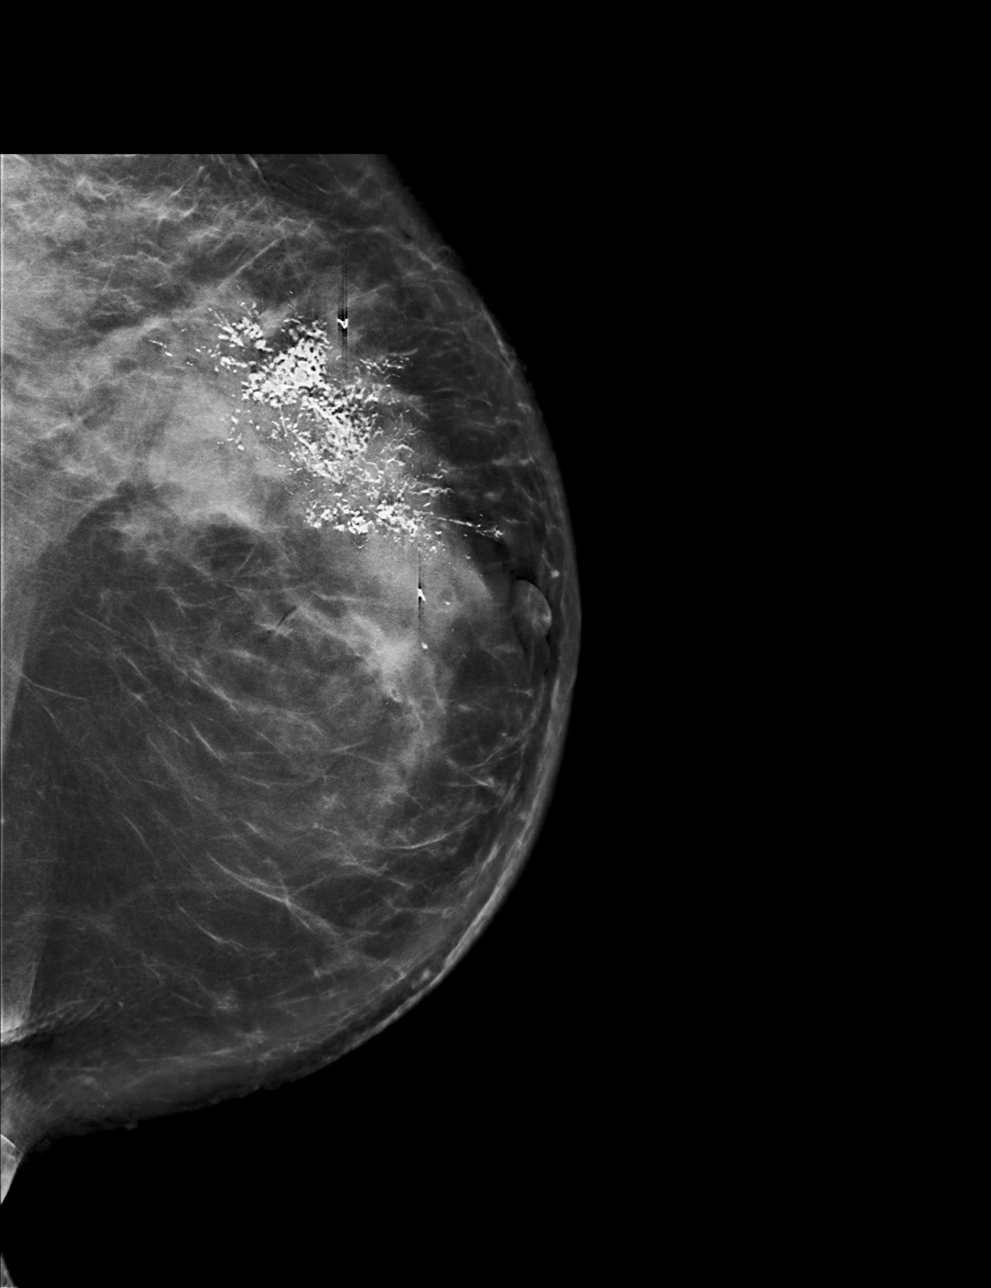

[L CC tomo · tomo slice 53/106.0]
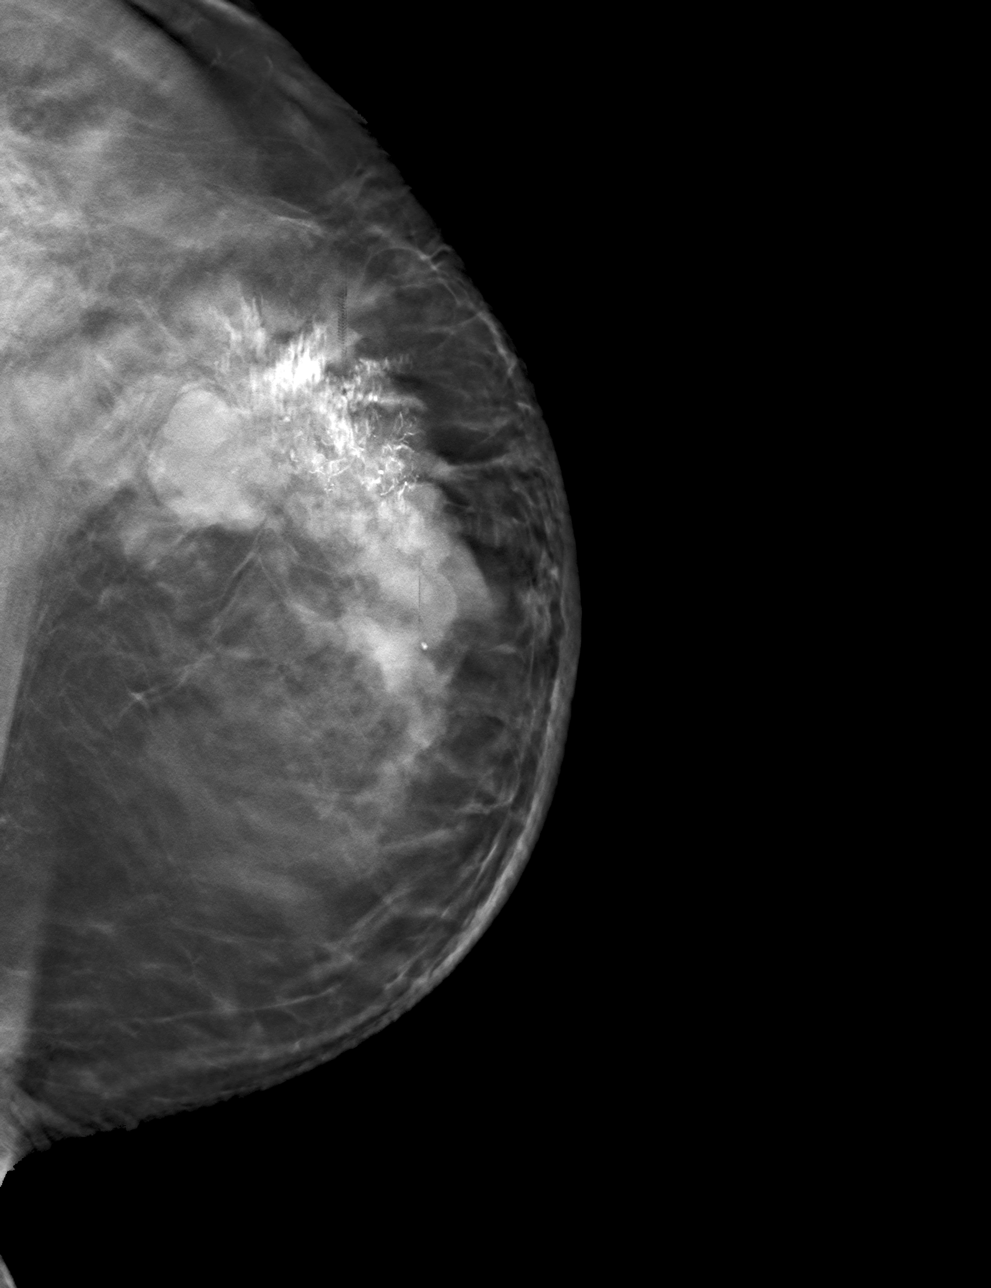

[L ML tomo · tomo slice 67/133.0]
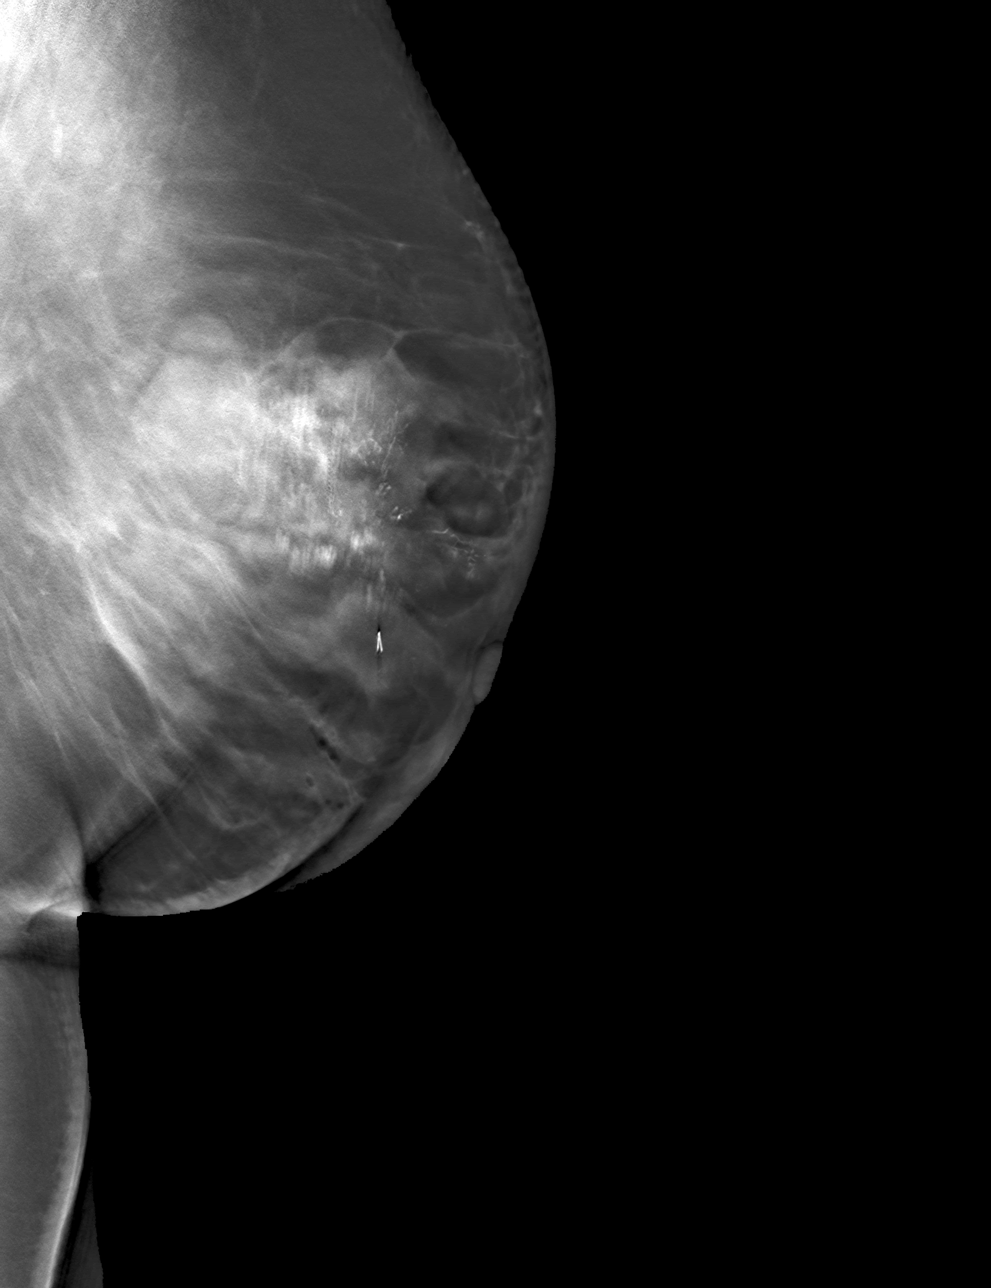

[4 of 12 positions shown; findings below may reference images not displayed]

FINDINGS: 3D Mammographic images were obtained following ultrasound guided
biopsy of 2 sites in the left breast and 1 site in the low left
axilla. The biopsy marking clips are in expected position at the
sites of biopsy.
IMPRESSION: 1. Appropriate positioning of the ribbon shaped biopsy marking clip
at the site of biopsy in the retroareolar lower outer left breast.

2. Appropriate positioning of the wing shaped biopsy marking clip in
the upper-outer quadrant of the left breast.

3. Appropriate positioning the HydroMARK clip in the low left
axilla.

Final Assessment: Post Procedure Mammograms for Marker Placement

## 2021-09-24 MED ORDER — LIDOCAINE-EPINEPHRINE (PF) 1 %-1:200000 IJ SOLN
INTRAMUSCULAR | Status: AC
  Filled 2021-09-24: qty 30

## 2021-09-24 MED ORDER — LIDOCAINE HCL (PF) 2 % IJ SOLN
INTRAMUSCULAR | Status: AC
  Filled 2021-09-24: qty 30

## 2021-10-01 ENCOUNTER — Ambulatory Visit
Admission: RE | Admit: 2021-10-01 | Discharge: 2021-10-01 | Disposition: A | Discharge status: Home / Self Care | Payer: Managed Care, Other (non HMO) | Source: Ambulatory Visit | Attending: Family Medicine | Admitting: Family Medicine

## 2021-10-01 DIAGNOSIS — R928 Other abnormal and inconclusive findings on diagnostic imaging of breast: Secondary | ICD-10-CM

## 2021-10-01 DIAGNOSIS — R921 Mammographic calcification found on diagnostic imaging of breast: Secondary | ICD-10-CM

## 2021-10-01 IMAGING — MG MM BREAST BX W/ LOC DEV 1ST LESION IMAGE BX SPEC STEREO GUIDE*R*
8 of 13 series · 8 of 25 positions shown · non-contrast
Comparison: Previous exams.
COMPARISON: Previous exams.

Addendum:
CLINICAL DATA: Patient with indeterminate coarse heterogeneous
calcifications in the RIGHT breast presents today for stereotactic
biopsy.

Recent diagnosis of LEFT breast cancer.
EXAM:
RIGHT BREAST STEREOTACTIC CORE NEEDLE BIOPSY

[R (1 of 8)]
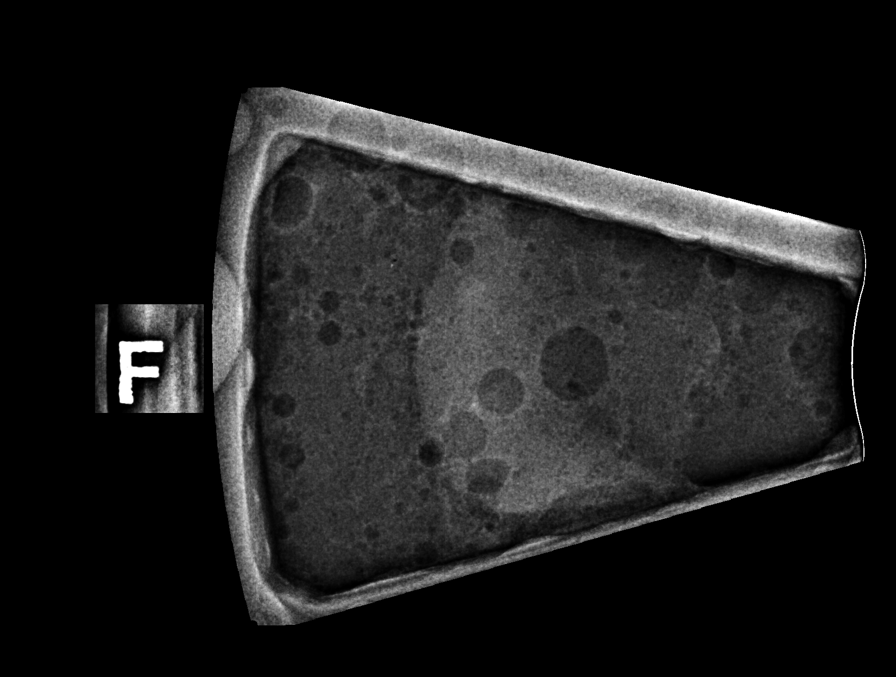

[R (2 of 8)]
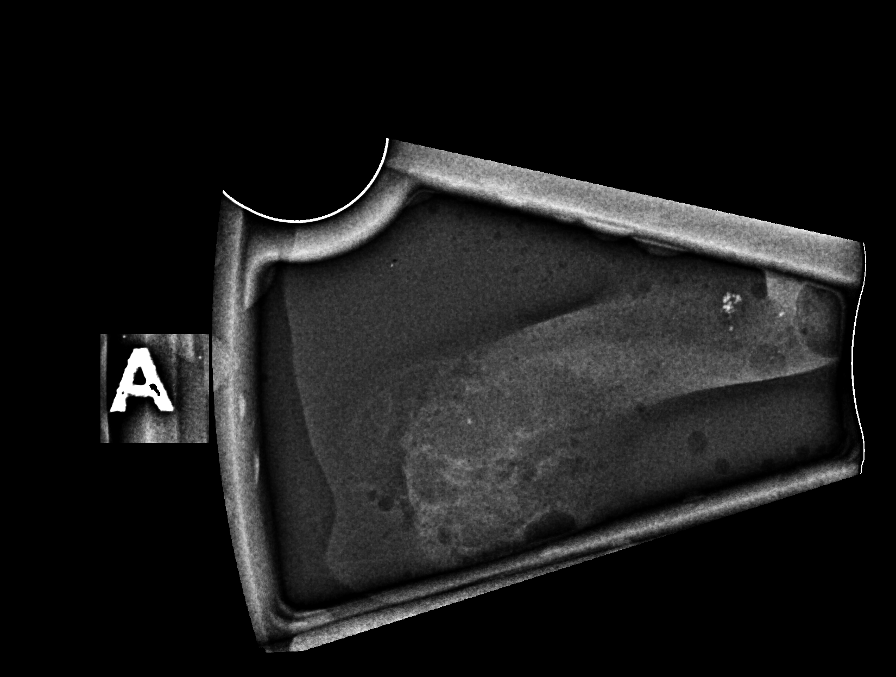

[R (3 of 8)]
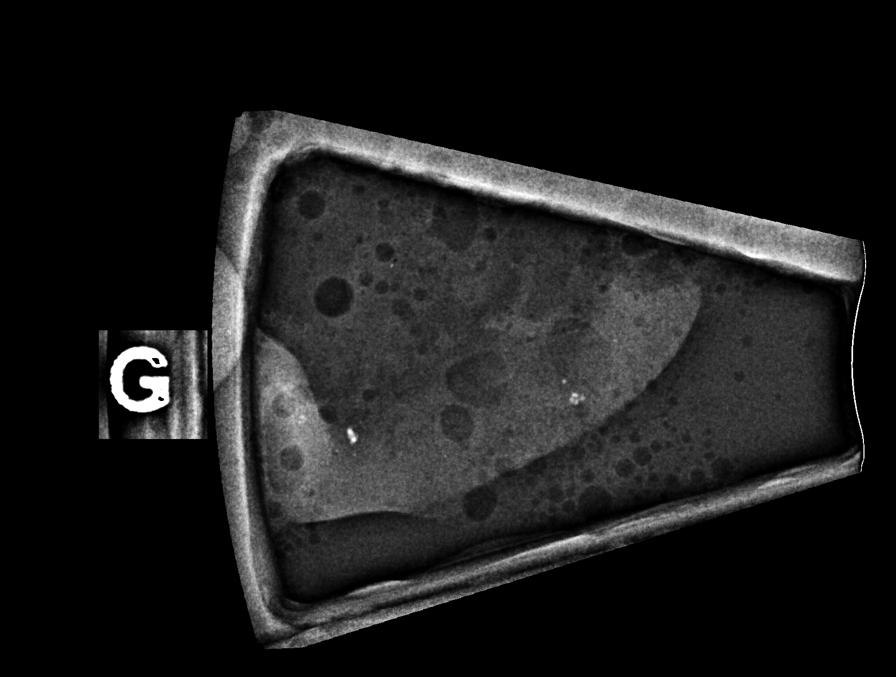

[R (4 of 8)]
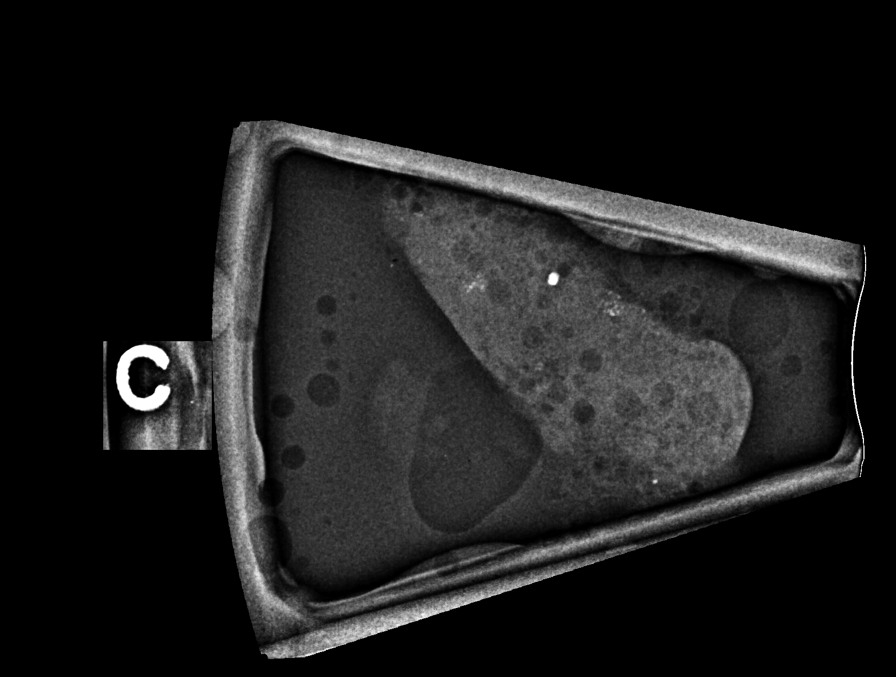

[R (5 of 8)]
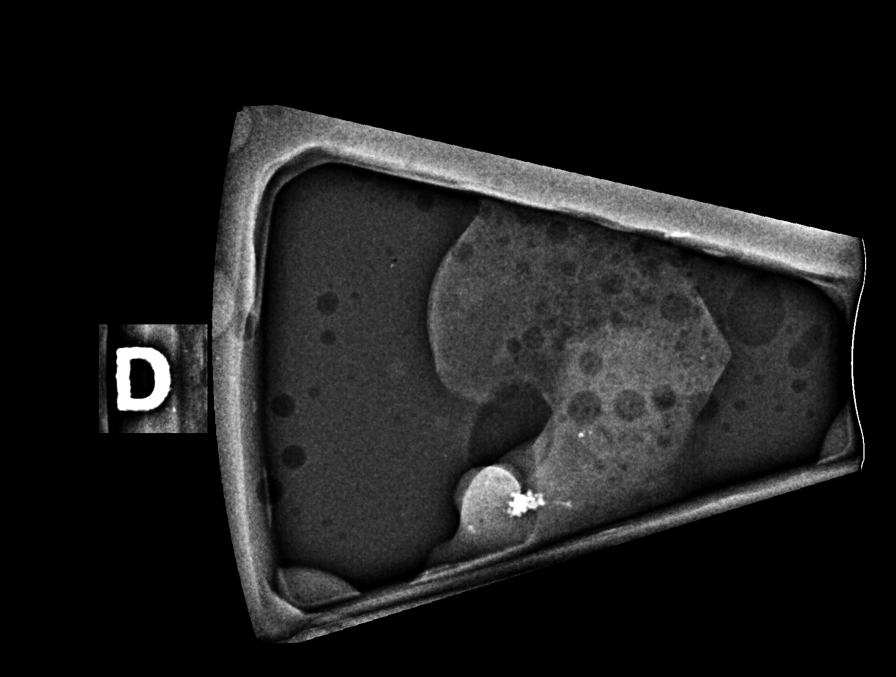

[R (6 of 8)]
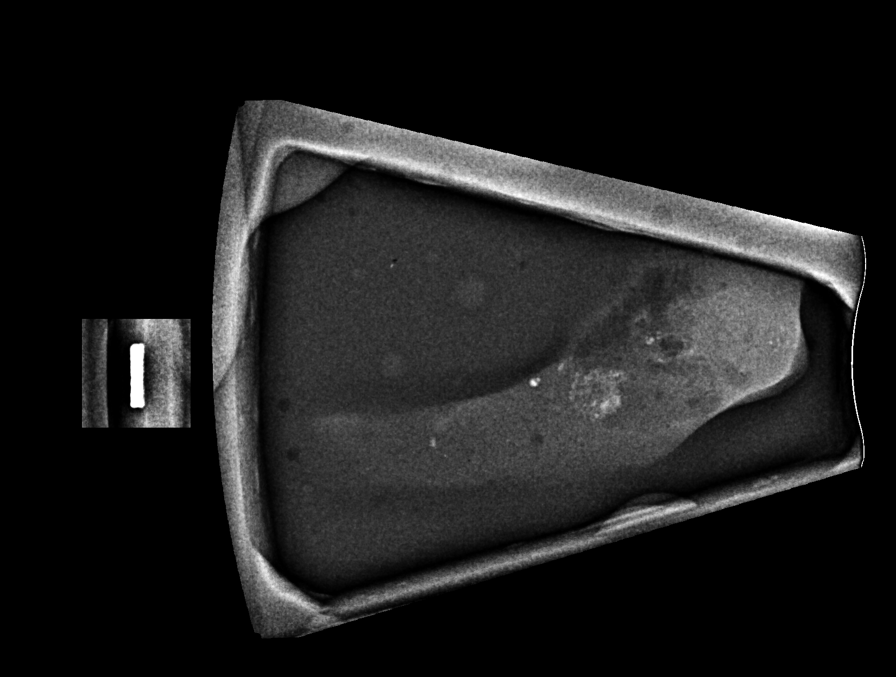

[R (7 of 8)]
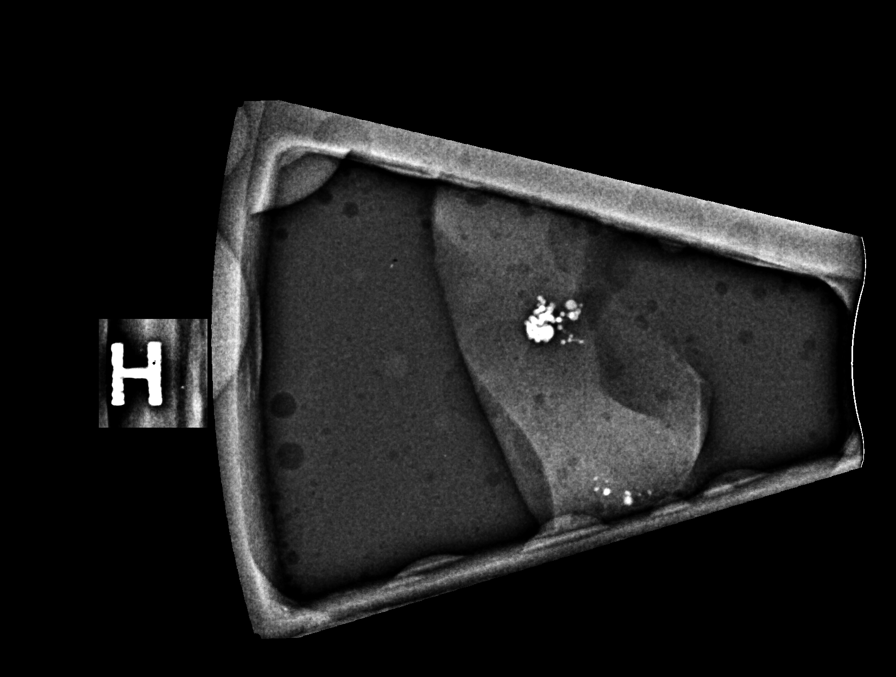

[R (8 of 8)]
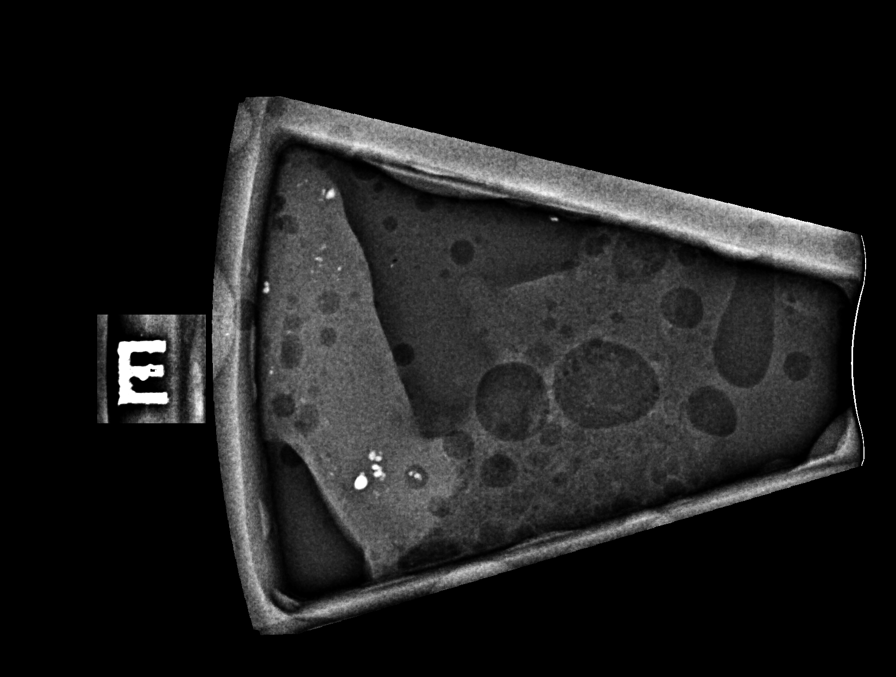

[8 of 25 positions shown; findings below may reference images not displayed]



Using sterile technique and 1% Lidocaine as local anesthetic, under
stereotactic guidance, a 9 gauge vacuum assisted device was used to
perform core needle biopsy of calcifications in the upper outer
quadrant of the RIGHT breast using a superior approach. Specimen
radiograph was performed showing calcifications. Specimens with
calcifications are identified for pathology.

Lesion quadrant: Upper outer quadrant

At the conclusion of the procedure, coil shaped tissue marker clip
was deployed into the biopsy cavity. Follow-up 2-view mammogram was
performed and dictated separately.
IMPRESSION: Stereotactic-guided biopsy of indeterminate coarse heterogeneous
calcifications within the upper-outer quadrant of the RIGHT breast.
No apparent complications.

ADDENDUM:
Pathology revealed FIBROADENOMATOID NODULE WITH CALCIFICATIONS,
FIBROCYSTIC CHANGES INCLUDING APOCRINE METAPLASIA of the RIGHT
breast, upper outer quadrant, (coil clip). This was found to be
concordant by Dr. SHACKELFORD.

Pathology results were discussed with the patient by telephone. The
patient reported doing well after the biopsy with minimal to no
tenderness at the site. Post biopsy instructions and care were
reviewed and questions were answered. The patient was encouraged to
call The [REDACTED] for any additional
concerns. My direct phone number was provided.

The patient has a recent diagnosis of LEFT breast cancer and is
scheduled to see Dr. SHACKELFORD at [REDACTED]
[REDACTED] in [HOSPITAL][HOSPITAL] on [DATE].

Pathology results reported by SHACKELFORD, RN on [DATE].



Using sterile technique and 1% Lidocaine as local anesthetic, under
stereotactic guidance, a 9 gauge vacuum assisted device was used to
perform core needle biopsy of calcifications in the upper outer
quadrant of the RIGHT breast using a superior approach. Specimen
radiograph was performed showing calcifications. Specimens with
calcifications are identified for pathology.

Lesion quadrant: Upper outer quadrant

At the conclusion of the procedure, coil shaped tissue marker clip
was deployed into the biopsy cavity. Follow-up 2-view mammogram was
performed and dictated separately.
IMPRESSION: Stereotactic-guided biopsy of indeterminate coarse heterogeneous
calcifications within the upper-outer quadrant of the RIGHT breast.
No apparent complications.

## 2021-10-01 IMAGING — MG MM BREAST LOCALIZATION CLIP
4 series · 4 of 12 positions shown · non-contrast
Comparison: Previous exam(s).

CLINICAL DATA: Status post stereotactic biopsy for indeterminate
coarse heterogeneous calcifications in the outer RIGHT breast.

EXAM:
3D DIAGNOSTIC RIGHT MAMMOGRAM POST STEREOTACTIC BIOPSY

[R ML synth-2D]
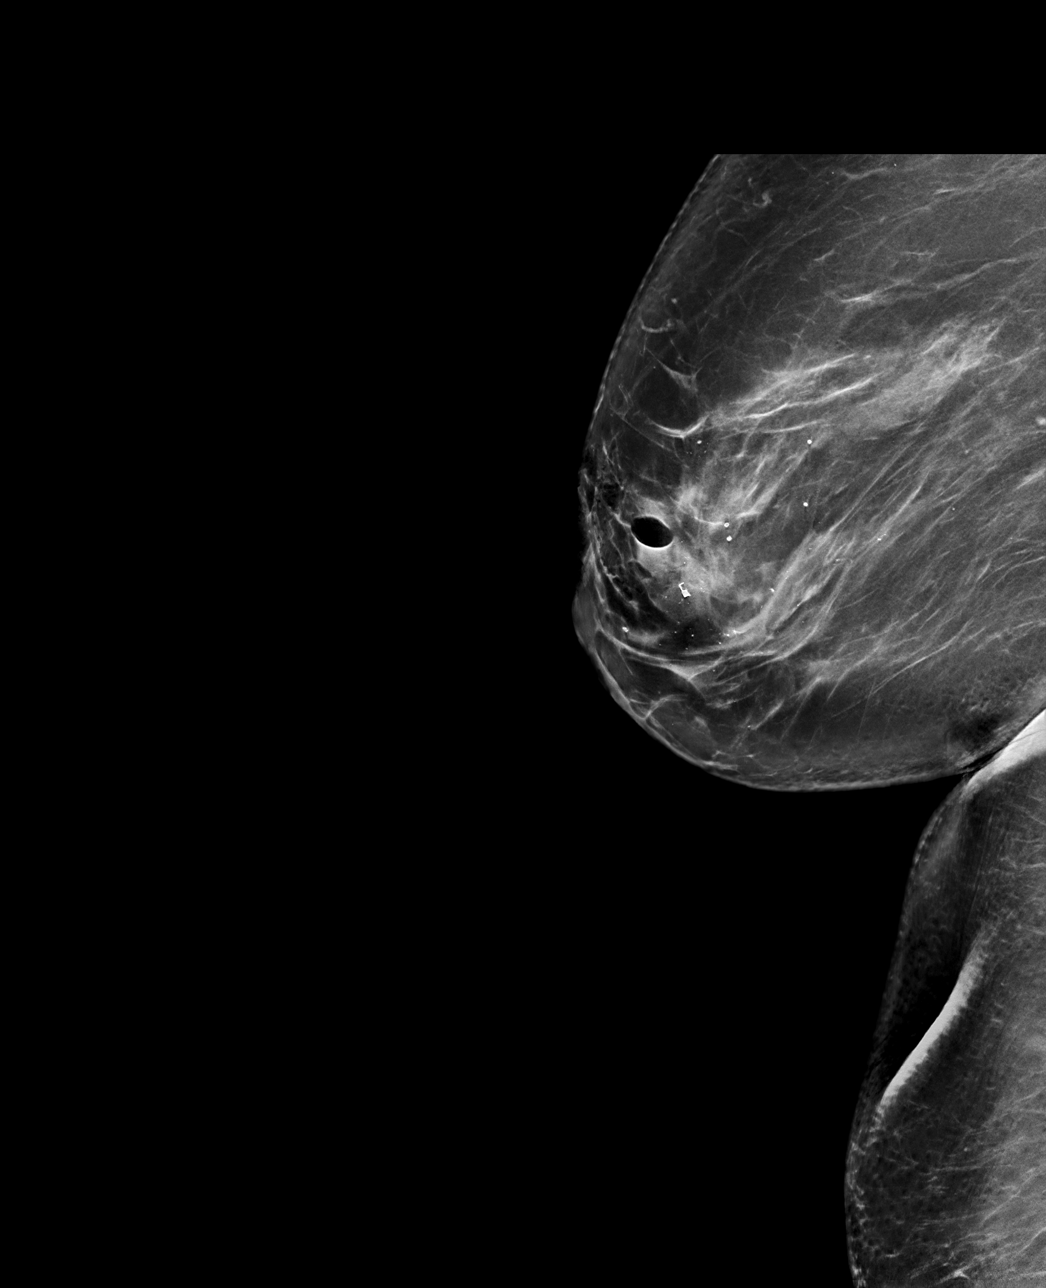

[R CC synth-2D]
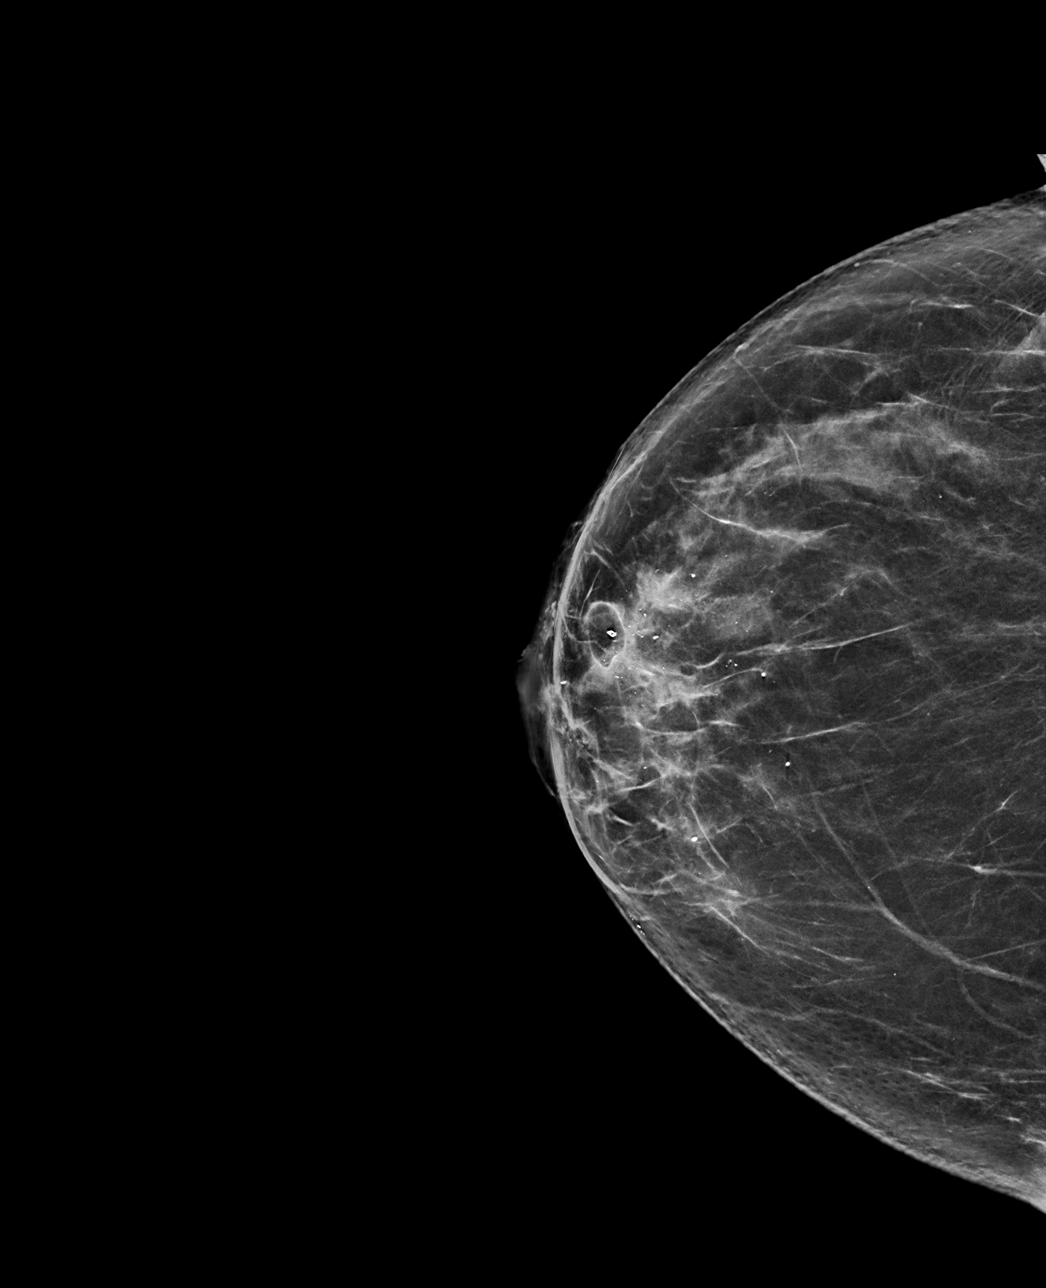

[R CC tomo · tomo slice 41/80.0]
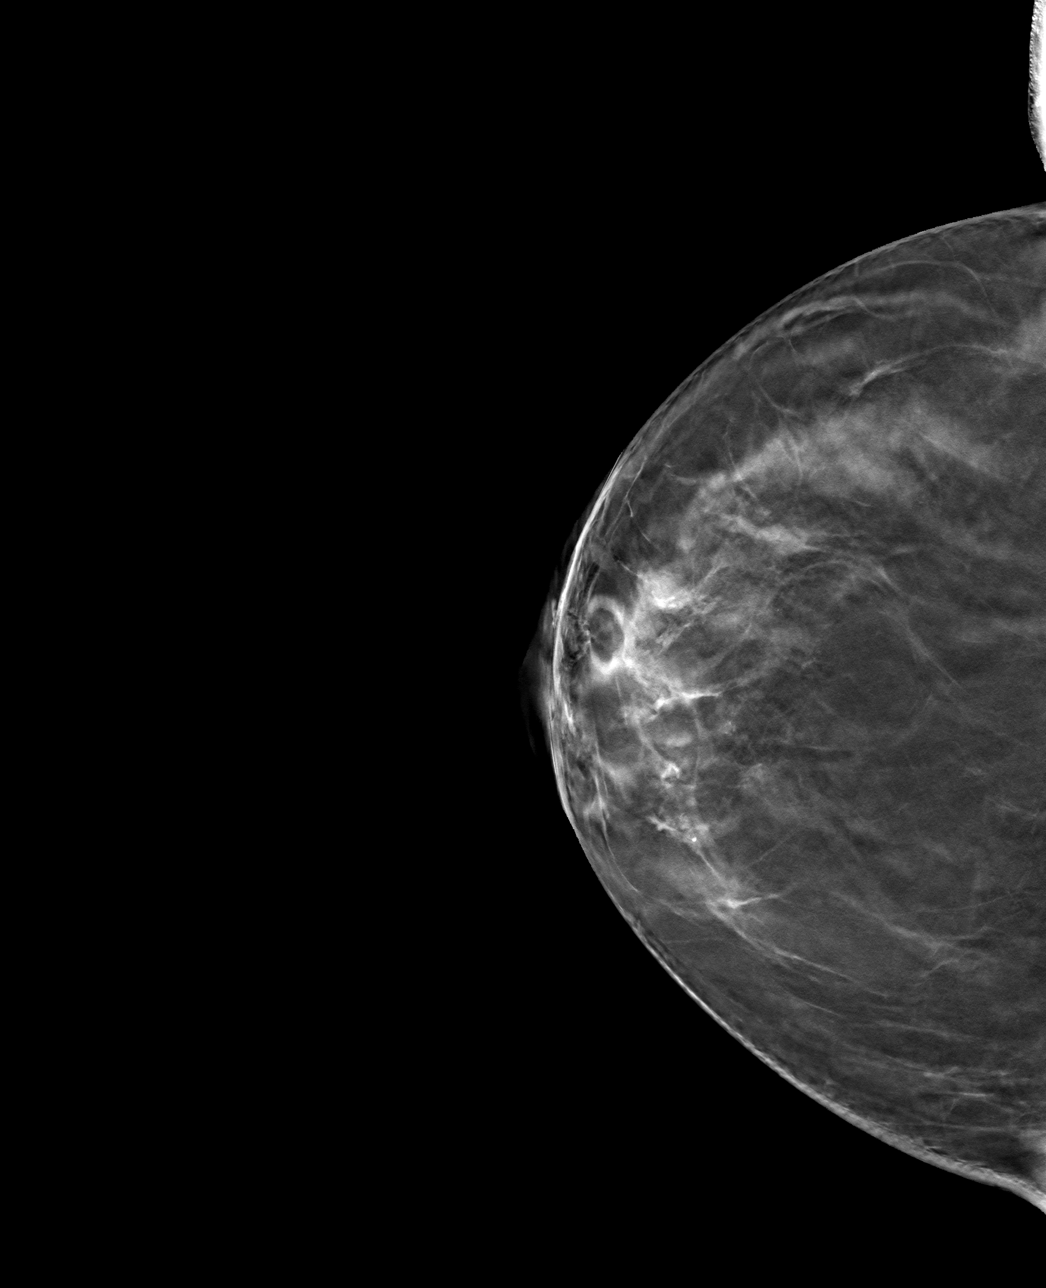

[R ML tomo · tomo slice 49/97.0]
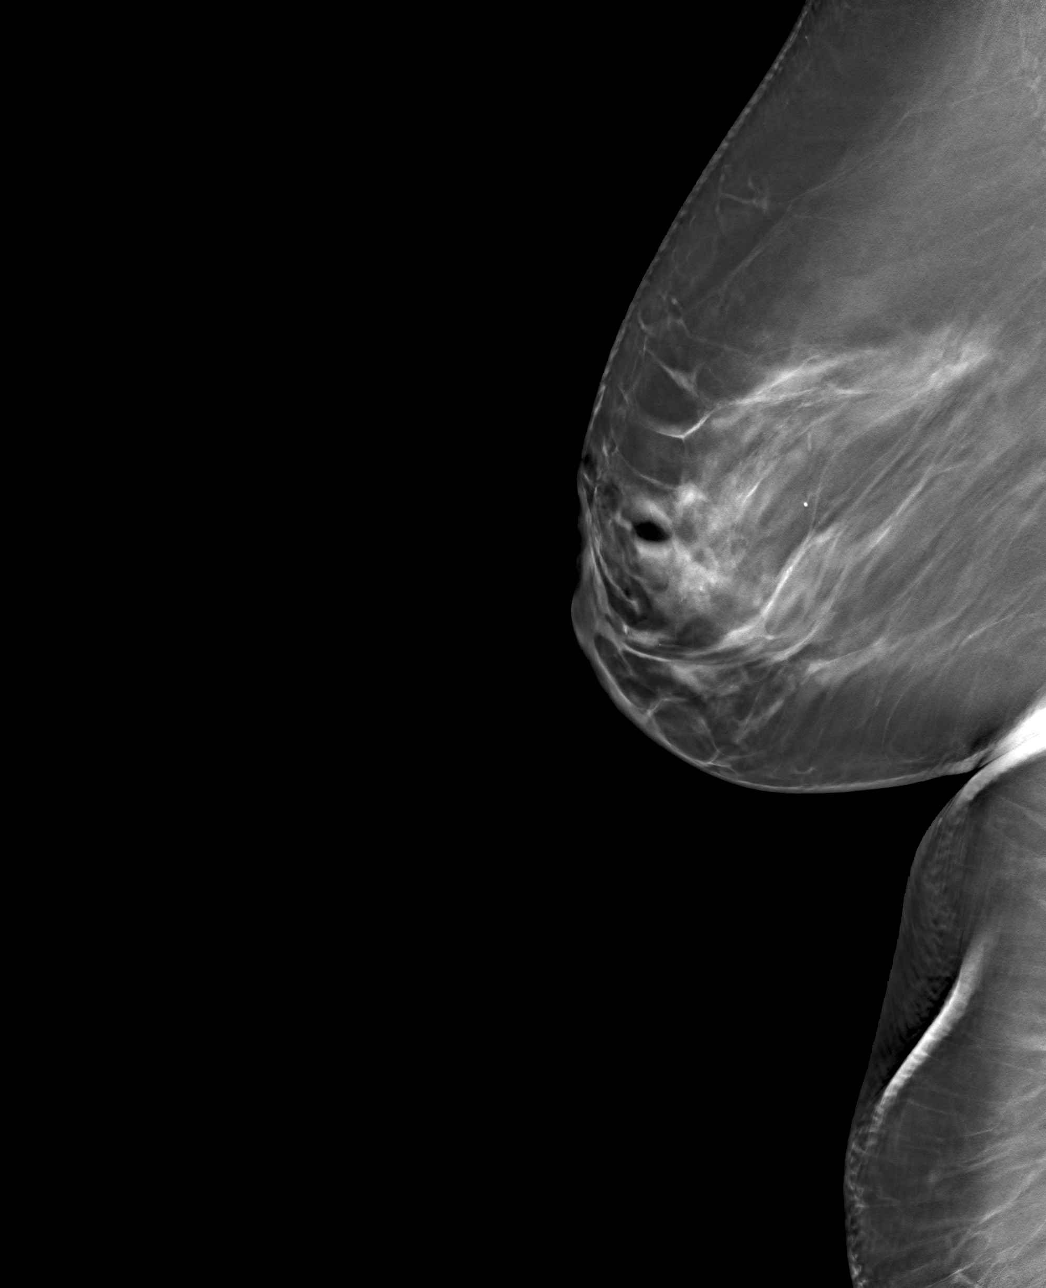

[4 of 12 positions shown; findings below may reference images not displayed]

FINDINGS: 3D Mammographic images were obtained following stereotactic guided
biopsy of indeterminate calcifications within the upper-outer
quadrant of the RIGHT breast. The biopsy marking clip is in expected
position at the site of biopsy.
IMPRESSION: Appropriate positioning of the coil shaped biopsy marking clip at
the site of biopsy in the upper-outer quadrant of the RIGHT breast.

Final Assessment: Post Procedure Mammograms for Marker Placement

## 2021-10-06 ENCOUNTER — Encounter: Payer: Self-pay | Admitting: *Deleted

## 2021-10-06 ENCOUNTER — Ambulatory Visit: Payer: Managed Care, Other (non HMO) | Admitting: General Surgery

## 2021-10-06 ENCOUNTER — Other Ambulatory Visit: Payer: Self-pay

## 2021-10-06 ENCOUNTER — Telehealth: Payer: Self-pay | Admitting: Cardiovascular Disease

## 2021-10-06 ENCOUNTER — Encounter: Payer: Self-pay | Admitting: General Surgery

## 2021-10-06 VITALS — BP 143/86 | HR 74 | Temp 98.6°F | Resp 16 | Ht 61.0 in | Wt 173.0 lb

## 2021-10-06 DIAGNOSIS — C773 Secondary and unspecified malignant neoplasm of axilla and upper limb lymph nodes: Secondary | ICD-10-CM | POA: Diagnosis not present

## 2021-10-06 DIAGNOSIS — C50912 Malignant neoplasm of unspecified site of left female breast: Secondary | ICD-10-CM

## 2021-10-06 NOTE — Patient Instructions (Signed)
Will order and MRI to further assess the cancer given the size and the lymph nodes. Will get you referred to Oncology, Dr. Larna Daughters to further assess prior to surgeries.  Will be happy to place a port a catheter once Dr. Larna Daughters has seen you and decided about treatments. Will get you to see Dr. Gwenlyn Found to ensure you are ok to hold your Brilinta for the procedure and surgeries.  Breast Cancer, Female Breast cancer is a malignant growth of tissue (tumor) in the breast. Unlike noncancerous (benign) tumors, malignant tumors are cancerous and can spread to other parts of the body. The two most common types of breast cancer start in the milk ducts (ductal carcinoma) or in the lobules where milk is made in the breast (lobular carcinoma). Breast cancer is one of the most common types of cancer in women. What are the causes? The exact cause of female breast cancer is unknown. What increases the risk? The following factors may make you more likely to develop this condition: Being older than 65 years of age. Race and ethnicity. Caucasian women generally have an increased risk, but African-American women are more likely to develop the disease before age 7. Having a family history of breast cancer. Having had breast cancer in the past. Having certain noncancerous conditions of the breast, such as dense breast tissue. Having the BRCA1 and BRCA2 genes. Having a history of radiation exposure. Obesity. Starting menopause after age 44. Starting your menstrual periods before age 89. Having never been pregnant or having your first child after age 24. Having never breastfed. Using hormone therapy after menopause. Using birth control pills. Drinking more than one alcoholic drink a day. Exposure to the drug DES, which was given to pregnant women from the 1940s to the 1970s. What are the signs or symptoms? Symptoms of this condition include: A painless lump or thickening in your breast. Changes in the size  or shape of your breast. Breast skin changes, such as puckering or dimpling. Nipple abnormalities, such as scaling, crustiness, redness, or pulling in (retraction). Nipple discharge that is bloody or clear. How is this diagnosed? This condition may be diagnosed by: Taking your medical history and doing a physical exam. During the exam, your health care provider will feel the tissue around your breast and under your arms. Taking a sample of nipple discharge. The sample will be examined under a microscope. Performing imaging tests, such as breast X-rays (mammogram), breast ultrasound exams, or an MRI. Taking a tissue sample (biopsy) from the breast. The sample will be examined under a microscope to look for cancer cells. Taking a sample from the lymph nodes near the affected breast (sentinel node biopsy). Your cancer will be staged to determine its severity and extent. Staging is a careful attempt to find out the size of the tumor, whether the cancer has spread, and if so, to what parts of the body. Staging also includes testing your tumor for certain receptors, such as estrogen, progesterone, and human epidermal growth factor receptor 2 (HER2). This will help your cancer care team decide on a treatment that will work best for you. You may need to have more tests to determine the stage of your cancer. Stages include the following: Stage 0--The tumor has not spread to other breast tissue. Stage I--The cancer is only found in the breast or may be in the lymph nodes. The tumor may be up to  in (2 cm) wide. Stage II--The cancer has spread to nearby lymph nodes. The  tumor may be up to 2 in (5 cm) wide. Stage III--The cancer has spread to more distant lymph nodes. The tumor may be larger than 2 in (5 cm) wide. Stage IV--The cancer has spread to other parts of the body, such as the bones, brain, liver, or lungs. How is this treated? Treatment for this condition depends on the type and stage of the breast  cancer. It may be treated with: Surgery. This may involve breast-conserving surgery (lumpectomy or partial mastectomy) in which only the part of the breast containing the cancer is removed. Some normal tissue surrounding this area may also be removed. In some cases, surgery may be done to remove the entire breast (mastectomy) and nipple. Lymph nodes may also be removed. Radiation therapy, which uses high-energy rays to kill cancer cells. Chemotherapy, which is the use of drugs to kill cancer cells. Hormone therapy, which involves taking medicine to adjust the hormone levels in your body. You may take medicine to decrease your estrogen levels. This can help stop cancer cells from growing. Targeted therapy, in which drugs are used to block the growth and spread of cancer cells. These drugs target a specific part of the cancer cell and usually cause fewer side effects than chemotherapy. Targeted therapy may be used alone or in combination with chemotherapy. A combination of surgery, radiation, chemotherapy, or hormone therapy may be needed to treat breast cancer. Follow these instructions at home: Take over-the-counter and prescription medicines only as told by your health care provider. Eat a healthy diet. A healthy diet includes lots of fruits and vegetables, low-fat dairy products, lean meats, and fiber. Make sure half your plate is filled with fruits or vegetables. Choose high-fiber foods such as whole-grain breads and cereals. Consider joining a support group. This may help you learn to cope with the stress of having breast cancer. Talk to your health care team about exercise and physical activity. The right exercise program can: Help prevent or reduce symptoms such as fatigue or depression. Improve overall health and survival rates. Keep all follow-up visits as told by your health care provider. This is important. Where to find more information American Cancer Society: www.cancer.[place]: www.cancer.gov Contact a health care provider if: You have a sudden increase in pain. You have any symptoms or changes that concern you. You lose weight without trying. You notice a new lump in either breast or under your arm. You develop swelling in either arm or hand. You have a fever. You notice new fatigue or weakness. Get help right away if: You have chest pain or trouble breathing. You faint. Summary Breast cancer is a malignant growth of tissue (tumor) in the breast. Your cancer will be staged to determine its severity and extent. Treatment for this condition depends on the type and stage of the breast cancer. This information is not intended to replace advice given to you by your health care provider. Make sure you discuss any questions you have with your health care provider. Document Revised: 07/22/2017 Document Reviewed: 04/04/2017 Elsevier Patient Education  [place].'  Implanted Port Insertion Implanted port insertion is a procedure to put in a port and catheter. The port is a device with an injectable disc that can be accessed by your health care provider. The port is connected to a vein in the chest or neck by a small, thin tube (catheter). There are different types of ports. The implanted port may be used as a long-term IV access for:  Medicines, such as chemotherapy. Fluids. Liquid nutrition, such as total parenteral nutrition (TPN). When you have a port, your health care provider can choose to use the port instead of veins in your arms for these procedures. Tell a health care provider about: Any allergies you have. All medicines you are taking, especially blood thinners, as well as any vitamins, herbs, eye drops, creams, over-the-counter medicines, and steroids. Any problems you or family members have had with anesthetic medicines. Any bleeding problems you have. Any surgeries you have had. Any medical conditions you have or have had,  including diabetes or kidney problems. Whether you are pregnant or may be pregnant. What are the risks? Generally, this is a safe procedure. However, problems may occur, including: Allergic reactions to medicines or dyes. Damage to other structures or organs. Infection. Damage to the blood vessel, bruising, or bleeding at the puncture site. Blood clot. Breakdown of the skin over the port. A collection of air in the chest that can cause one of the lungs to collapse (pneumothorax). This is rare. What happens before the procedure?  Medicines Ask your health care provider about: Changing or stopping your regular medicines. This is especially important if you are taking diabetes medicines or blood thinners. Taking medicines such as aspirin and ibuprofen. These medicines can thin your blood. Do not take these medicines unless your health care provider tells you to take them. Taking over-the-counter medicines, vitamins, herbs, and supplements. General instructions If you will be going home right after the procedure, plan to have a responsible adult: Take you home from the hospital or clinic. You will not be allowed to drive. Care for you for the time you are told. You may have blood tests. Do not use any products that contain nicotine or tobacco for at least 4 weeks before the procedure. These products include cigarettes, chewing tobacco, and vaping devices, such as e-cigarettes. If you need help quitting, ask your health care provider. Ask your health care provider what steps will be taken to help prevent infection. These may include: Removing hair at the surgery site. Washing skin with a germ-killing soap. Taking antibiotic medicine. What happens during the procedure?  An IV will be inserted into one of your veins. You will be given one or more of the following: A medicine to help you relax (sedative). A medicine to numb the area (local anesthetic). Two small incisions will be made to  insert the port. One smaller incision will be made in your neck to get access to the vein where the catheter will lie. The other incision will be made in the upper chest. This is where the port will lie. The procedure may be done using continuous X-ray (fluoroscopy) or other imaging tools for guidance. The port and catheter will be placed. There may be a small, raised area where the port is placed. The port will be flushed with a saline solution, which is made of salt and water, and blood will be drawn to make sure that the port is working correctly. The incisions will be closed. Bandages (dressings) may be placed over the incisions. The procedure may vary among health care providers and hospitals. What happens after the procedure? Your blood pressure, heart rate, breathing rate, and blood oxygen level will be monitored until you leave the hospital or clinic. If you were given a sedative during the procedure, it can affect you for several hours. Do not drive or operate machinery until your health care provider says that it is safe.  You will be given a manufacturer's information card for the type of port that you have. Keep this with you. Your port will need to be flushed and checked as told by your health care provider, usually every few weeks. A chest X-ray will be done to: Check the placement of the port. Make sure there is no injury to your lung. Summary Implanted port insertion is a procedure to put in a port and catheter. The implanted port is used as a long-term IV access. The port will need to be flushed and checked as told by your health care provider, usually every few weeks. Keep your manufacturer's information card with you at all times. This information is not intended to replace advice given to you by your health care provider. Make sure you discuss any questions you have with your health care provider. Document Revised: 02/10/2021 Document Reviewed: 02/10/2021 Elsevier Patient  Education  [place].

## 2021-10-06 NOTE — Telephone Encounter (Signed)
Dr.Berry,  Traci Fuller is scheduled for port placement with [facility] and has an upcoming preoperative clearance appointment visit with Jory Sims on 10/16/21. 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 We are reaching out to get your recommendations for holding her Brilinta for this upcoming procedure. Please forward your recommendations to [facility] PREOP.  Thank you

## 2021-10-06 NOTE — Progress Notes (Signed)
[facility] History and Physical  Reason for Referral: Left breast cancer with positive nodes  Referring Physician: Celene Squibb, MD   Chief Complaint   New Patient (Initial Visit)     Traci Fuller is a 66 y.o. female.  HPI: [name] is a 66  yo who had her first mammogram this year. She has been noticing a lump in that area for over 3 years but this became larger and harder since September.  She has noted nipple retraction but no nipple drainage. She had a history of prior CAD and stent placement.   She had menarche at age 34, and her first pregnancy at age 29. She has a history of  breast cancer in her maternal grandmother and maternal 1/2 aunt.  She underwent menopause around 70.  She has not had any chest radiation.   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 DES to midLAD, 75% stenosis of RCA not treated   Current smoker    Essential hypertension    Hyperlipidemia LDL goal <70     Past Surgical History:  Procedure Laterality Date   ABDOMINAL HYSTERECTOMY     CORONARY STENT INTERVENTION N/A 01/10/2018   Procedure: CORONARY STENT INTERVENTION;  Surgeon: Jettie Booze, MD;  Location: [place] [facility];  Service: Cardiovascular;  Laterality: N/A;   LEFT HEART CATH AND CORONARY ANGIOGRAPHY N/A 01/10/2018   Procedure: LEFT HEART CATH AND CORONARY ANGIOGRAPHY;  Surgeon: Jettie Booze, MD;  Location: [place] [facility];  Service: Cardiovascular;  Laterality: N/A;    Family History  Problem Relation Age of Onset   Hypertension Father     Social History   Tobacco Use   Smoking status: Every Day    Packs/day: 5.00    Years: 43.00    Pack years: 215.00    Types: Cigarettes    Start date: 1976   Smokeless tobacco: Never  Vaping Use   Vaping Use: Never used  Substance Use Topics   Alcohol use: Yes   Drug use: Never    Medications: I have reviewed the patient's current  medications. Allergies as of 10/06/2021   No Known Allergies      Medication List        Accurate as of October 06, 2021 11:59 PM. If you have any questions, ask your nurse or doctor.          STOP taking these medications    atorvastatin 80 MG tablet Commonly known as: LIPITOR Stopped by: Virl Cagey, MD   levocetirizine 5 MG tablet Commonly known as: XYZAL Stopped by: Virl Cagey, MD   lisinopril 20 MG tablet Commonly known as: ZESTRIL Stopped by: Virl Cagey, MD       TAKE these medications    amLODipine 10 MG tablet Commonly known as: NORVASC Take 10 mg by mouth daily.   aspirin 81 MG EC tablet Take 1 tablet (81 mg total) by mouth daily.   cetirizine 10 MG tablet Commonly known as: ZYRTEC Take 10 mg by mouth daily as needed.   gabapentin 300 MG capsule Commonly known as: NEURONTIN Take 300 mg by mouth 3 (three) times daily.   metoprolol tartrate 25 MG tablet Commonly known as: LOPRESSOR Take 1 tablet (25 mg total) by mouth 2 (two) times daily. Please call and schedule overdue appointment with Dr. Gwenlyn Found for further refills . SECOND ATTEMPT!   nitroGLYCERIN 0.4 MG  SL tablet Commonly known as: NITROSTAT Place 1 tablet (0.4 mg total) under the tongue every 5 (five) minutes x 3 doses as needed for chest pain.   ticagrelor 90 MG Tabs tablet Commonly known as: BRILINTA Take 1 tablet (90 mg total) by mouth 2 (two) times daily.         ROS:  A comprehensive review of systems was negative except for: Respiratory: positive for SOB Musculoskeletal: positive for back pain  Blood pressure (!) 143/86, pulse 74, temperature 98.6 F (37 C), temperature source Other (Comment), resp. rate 16, height 5\' 1"  (1.549 m), weight 173 lb (78.5 kg), SpO2 98 %. Physical Exam Vitals reviewed.  Constitutional:      Appearance: Normal appearance.  HENT:     Head: Normocephalic.     Nose: Nose normal.     Mouth/Throat:     Mouth: Mucous membranes  are moist.  Eyes:     Extraocular Movements: Extraocular movements intact.  Cardiovascular:     Rate and Rhythm: Normal rate and regular rhythm.  Pulmonary:     Effort: Pulmonary effort is normal.     Breath sounds: Normal breath sounds.  Chest:  Breasts:    Right: No inverted nipple, mass, nipple discharge or tenderness.     Left: Inverted nipple, mass and tenderness present. No nipple discharge.     Comments: 15+ cm mass in the lateral aspect of the left breast extending toward the nipple and superior, palpable nodes Abdominal:     General: There is no distension.     Palpations: Abdomen is soft.     Tenderness: There is no abdominal tenderness.  Musculoskeletal:        General: Normal range of motion.     Cervical back: Normal range of motion.  Lymphadenopathy:     Upper Body:     Right upper body: No supraclavicular or axillary adenopathy.     Left upper body: Axillary adenopathy present. No supraclavicular adenopathy.  Skin:    General: Skin is warm.  Neurological:     General: No focal deficit present.     Mental Status: She is alert.  Psychiatric:        Mood and Affect: Mood normal.        Behavior: Behavior normal.    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 This was found to be concordant by Dr. Franki Cabot.   Pathology results were discussed with the patient by telephone. The patient reported doing well after the biopsy with minimal to no tenderness at the site. Post biopsy instructions and care were reviewed and questions were answered. The patient was encouraged to call [facility] for any additional concerns. My direct phone number was provided.   The patient has a recent diagnosis of LEFT breast cancer and is scheduled to see Dr. Curlene Labrum at [facility] in [place], [place] on October 06, 2021.   Pathology results reported by Terie Purser, RN on 10/02/2021.     Electronically Signed   By: Franki Cabot M.D.   On: 10/05/2021 07:46  CLINICAL DATA:  Patient with indeterminate coarse heterogeneous calcifications in the RIGHT breast presents today for stereotactic biopsy.   Recent diagnosis of LEFT breast cancer.   EXAM: RIGHT BREAST STEREOTACTIC CORE NEEDLE BIOPSY   COMPARISON:  Previous exams.   FINDINGS: The patient  and I discussed the procedure of stereotactic-guided biopsy including benefits and alternatives. We discussed the high likelihood of a successful procedure. We discussed the risks of the procedure including infection, bleeding, tissue injury, clip migration, and inadequate sampling. Informed written consent was given. The usual time out protocol was performed immediately prior to the procedure.   Using sterile technique and 1% Lidocaine as local anesthetic, under stereotactic guidance, a 9 gauge vacuum assisted device was used to perform core needle biopsy of calcifications in the upper outer quadrant of the RIGHT breast using a superior approach. Specimen radiograph was performed showing calcifications. Specimens with calcifications are identified for pathology.   Lesion quadrant: Upper outer quadrant   At the conclusion of the procedure, coil shaped tissue marker clip was deployed into the biopsy cavity. Follow-up 2-view mammogram was performed and dictated separately.   IMPRESSION: Stereotactic-guided biopsy of indeterminate coarse heterogeneous calcifications within the upper-outer quadrant of the RIGHT breast. No apparent complications.   Electronically Signed: By: Franki Cabot M.D. On: 10/01/2021 10:12   CLINICAL DATA:  Post biopsy mammogram of the left breast for clip placement.   EXAM: 3D DIAGNOSTIC LEFT MAMMOGRAM POST ULTRASOUND BIOPSY   COMPARISON:   Previous exam(s).   FINDINGS: 3D Mammographic images were obtained following ultrasound guided biopsy of 2 sites in the left breast and 1 site in the low left axilla. The biopsy marking clips are in expected position at the sites of biopsy.   IMPRESSION: 1. Appropriate positioning of the ribbon shaped biopsy marking clip at the site of biopsy in the retroareolar lower outer left breast.   2. Appropriate positioning of the wing shaped biopsy marking clip in the upper-outer quadrant of the left breast.   3. Appropriate positioning the HydroMARK clip in the low left axilla.   Final Assessment: Post Procedure Mammograms for Marker Placement     Electronically Signed   By: Ammie Ferrier M.D.   On: 09/24/2021 09:28    CLINICAL DATA:  Patient presents with a firm palpable mass in the left breast.   EXAM: DIGITAL DIAGNOSTIC BILATERAL MAMMOGRAM WITH TOMOSYNTHESIS AND CAD; ULTRASOUND LEFT BREAST LIMITED; ULTRASOUND RIGHT BREAST LIMITED   TECHNIQUE: Bilateral digital diagnostic mammography and breast tomosynthesis was performed. The images were evaluated with computer-aided detection.; Targeted ultrasound examination of the left breast was performed.; Targeted ultrasound examination of the right breast was performed   COMPARISON:  None.   ACR Breast Density Category c: The breast tissue is heterogeneously dense, which may obscure small masses.   FINDINGS: In the left breast, a conglomeration of ill-defined masses with associated pleomorphic calcifications extends from the retroareolar upper outer quadrant for approximately 8 cm anterior to posterior. Within this conglomeration is a more defined mass measuring 2 cm. There is associated architectural distortion. Posterior to the mass is asymmetric density that extends towards the pectoralis muscle. In addition, posterior to the conglomeration of masses is a second conglomeration of ill-defined masses without  associated calcifications, which extend towards the left axilla, spanning approximately 4 cm in long axis. There is also anterior breast skin thickening. There is a questionable thickened abnormal lymph node in the left axilla.   In the right breast, there is a mass measuring 8 mm that projects just medial to the nipple in the anterior breast. In the upper outer breast, more posteriorly, there is a partly obscured mass or masses. Extending from the nipple along the lateral breast are coarse heterogeneous calcifications, which span approximately 2.3 x 1.0 cm on  the cc view. There is no defined associated mass or distortion. There is a questionable abnormal lymph node in the right axilla.   On physical exam, there is a firm mass that bulges the contour of the overlying skin along the lateral left breast.   Targeted left breast ultrasound is performed, showing a large irregular mass, difficult to accurately measure, in the left breast at 4 o'clock, 1 cm from the nipple, spanning approximately 5 x by 2 x 4 cm, with echogenic foci consistent with calcifications. At 12 o'clock, 5 cm the nipple, there is an irregular mass measuring 3.9 x 2.0 x 3.1 cm. At 2 o'clock, 7 cm the nipple, there are 2 adjacent irregular masses measuring 1.50.9 cm.   Sonographic evaluation of the left axilla demonstrates 3 abnormal lymph nodes, the most abnormal showing a focal cortical thickening of 7 mm.   Targeted ultrasound is performed, showing a cyst in the 3 o'clock retroareolar breast measuring 11 x 7 x 8 mm. In the right breast at 10 o'clock, 6 cm the nipple, there is an elongated cyst with 2 thin septations versus contiguous cysts that span 2.1 x 0.5 x 2.1 cm. These cysts are consistent size and location to the mammographic masses.   Sonographic evaluation the right axilla shows normal lymph nodes. No thickened or abnormal nodes.   IMPRESSION: 1. Multiple left breast masses, the largest associated  with pleomorphic calcifications and architectural distortion, highly suspicious for multifocal breast carcinoma. 2. Three abnormal left axillary lymph nodes suspicious for metastatic lymphadenopathy. 3. Coarse heterogeneous right breast calcifications extending laterally from the nipple spanning 2.3 cm in long axis. 4. Benign right breast cysts. No abnormal right axillary lymph nodes.   RECOMMENDATION: 1. Ultrasound-guided core needle biopsies of the left breast at 3 sites. Ultrasound biopsy of the dominant palpable mass at 4 o'clock, 1 cm from the nipple. Ultrasound-guided core needle biopsy of 1 of the additional masses more peripherally-recommend 1 of the 2 masses at 2 o'clock, 7 cm from the nipple. Ultrasound-guided core needle biopsy the dominant abnormal left axillary lymph node. 2. Stereotactic core needle biopsy of the right breast calcifications.   The patient will be scheduled for the ultrasound-guided left breast core needle biopsies prior to being discharged from the ultrasound department.   I have discussed the findings and recommendations with the patient. If applicable, a reminder letter will be sent to the patient regarding the next appointment.   BI-RADS CATEGORY  5: Highly suggestive of malignancy.     Electronically Signed   By: Lajean Manes M.D.   On: 09/10/2021 16:20    FINAL MICROSCOPIC DIAGNOSIS:   A. BREAST, 4:00, LEFT, BIOPSY:  -  Invasive mammary carcinoma  -  See comment   B. BREAST, 2:00, LEFT, BIOPSY:  -  Invasive ductal carcinoma  -  See comment   C. LYMPH NODE, AXILLA, LOW, LEFT, BIOPSY:  -  Metastatic carcinoma involving nodal tissue   Assessment & Plan:  SHALYN KORAL is a 66 y.o. female with a left breast cancer that is very large and involves several nodes. She is at least Stage III given the size and nodes.  I think she needs to get neoadjuvant therapy to shrink this and further work up with MRI and possibly PET given the  timeframe we are talking.   MRI ordered Oncology seeing next week, stat referral  Cardiology for risk stratification as she has not seen them in over 2-3 years after her stent was placed  Discussed port placement pending Dr. Tomie China thoughts and risk of bleeding, infection, pneumothorax, injury to vessels.  Can get her set up once he is in agreement.   Future Appointments  Date Time Provider [place]  10/15/2021  8:15 AM Derek Jack, MD [facility] None  10/16/2021  1:55 PM Lendon Colonel, NP [facility]     All questions were answered to the satisfaction of the patient and family.  I spent over 60 minutes in this visit was spent in counseling/ coordination of care regarding the left breast cancer.    Virl Cagey 10/06/2021

## 2021-10-06 NOTE — Telephone Encounter (Signed)
° °  Pre-operative Risk Assessment    Patient Name: LANISA ISHLER  DOB: Mar 21, 1956 MRN: [id number]      Request for Surgical Clearance    Procedure:  port placement  Date of Surgery:  Clearance TBD                                 Surgeon:  Dr. Concha Pyo Group or Practice Name:  [facility] Phone number:  [phone] Fax number:  [phone]   Type of Clearance Requested:   - Medical  - Pharmacy:  [facility]icagrel[facility] (Brilinta)     Type of Anesthesia:   choice   Additional requests/questions:      Marquita Palms   10/06/2021, 10:33 AM

## 2021-10-08 ENCOUNTER — Other Ambulatory Visit: Payer: Self-pay | Admitting: *Deleted

## 2021-10-08 DIAGNOSIS — C773 Secondary and unspecified malignant neoplasm of axilla and upper limb lymph nodes: Secondary | ICD-10-CM

## 2021-10-09 ENCOUNTER — Other Ambulatory Visit: Payer: Self-pay

## 2021-10-09 ENCOUNTER — Ambulatory Visit
Admission: RE | Admit: 2021-10-09 | Discharge: 2021-10-09 | Disposition: A | Discharge status: Home / Self Care | Payer: Managed Care, Other (non HMO) | Source: Ambulatory Visit | Attending: General Surgery | Admitting: General Surgery

## 2021-10-09 DIAGNOSIS — C50912 Malignant neoplasm of unspecified site of left female breast: Secondary | ICD-10-CM

## 2021-10-09 DIAGNOSIS — C773 Secondary and unspecified malignant neoplasm of axilla and upper limb lymph nodes: Secondary | ICD-10-CM

## 2021-10-09 IMAGING — MR MR BREAST BILAT WO/W CM
8 of 13 series · 30 of 48 positions shown · IV contrast (gadavist)
Comparison: No prior MRI. Multiple recent mammograms and breast
ultrasounds.

CLINICAL DATA: 65-year-old with recent diagnosis of multicentric
LEFT breast invasive malignancy in the UPPER OUTER QUADRANT and the
LOWER OUTER QUADRANT and biopsy-proven metastatic disease to a LEFT
axillary lymph node. She also had a benign core needle biopsy of
calcifications in the UPPER OUTER QUADRANT of the RIGHT breast with
pathology demonstrating fibroadenoma. Preoperative MRI evaluation to
confirm extent of disease prior to definitive treatment.

EXAM:
BILATERAL BREAST MRI WITH AND WITHOUT CONTRAST
TECHNIQUE: Multiplanar, multisequence MR images of both breasts were obtained
prior to and following the intravenous administration of 8 ml of
Gadavist.

[Series 2: t2_tirm_tra ipat (a-p) · axial · 3.0mm · 0.78mm/px · 1 of 55 slices shown]
[im 1/55]
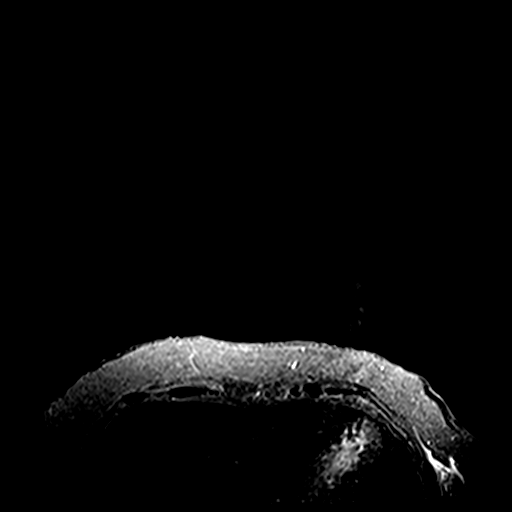

[Series 3: fl3d pre-cm no · axial · non-contrast · 1.2mm · 1.04mm/px · z∈[-46,+125]mm · 5 of 144 slices shown]
[im 1/144]
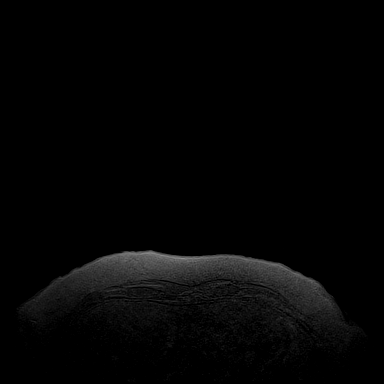
[im 36/144]
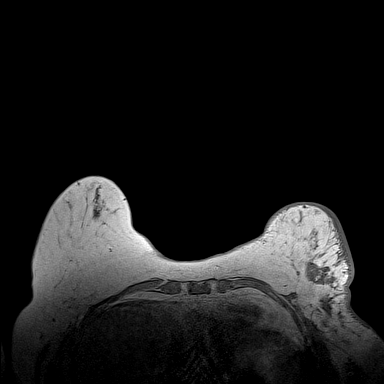
[im 72/144]
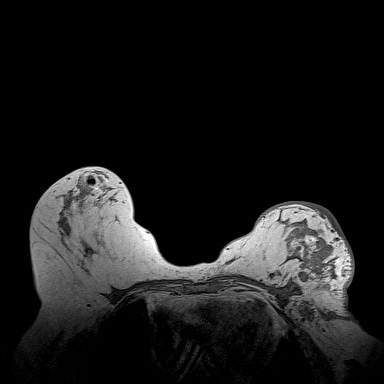
[im 108/144]
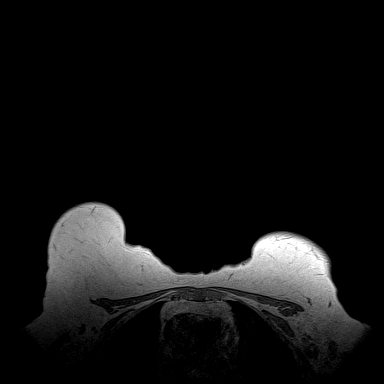
[im 144/144]
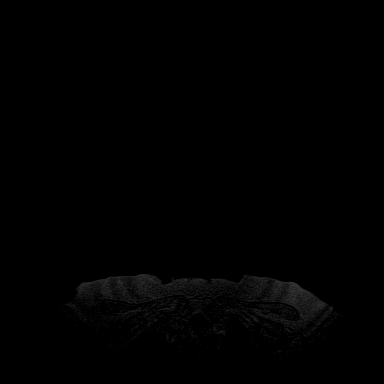

[Series 4: fl3d pre-cm · axial · non-contrast · 1.2mm · 1.04mm/px · z∈[-46,+125]mm · 5 of 144 slices shown]
[im 1/144]
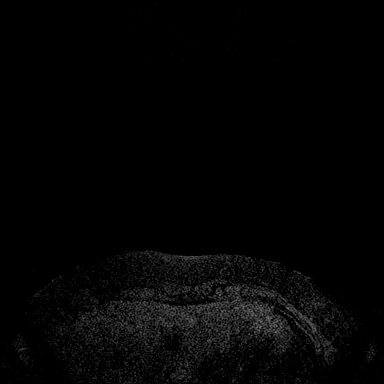
[im 36/144]
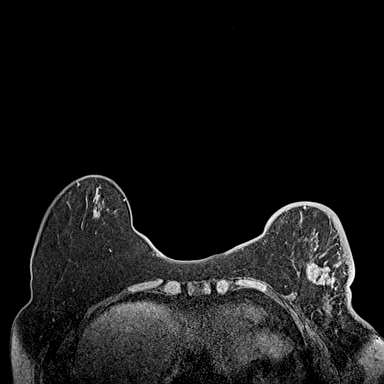
[im 72/144]
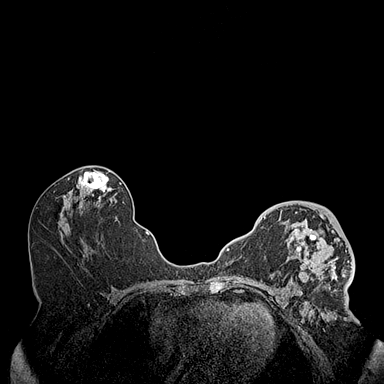
[im 108/144]
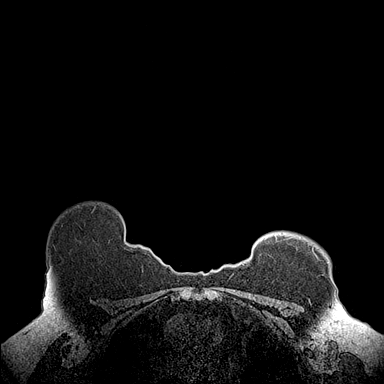
[im 144/144]
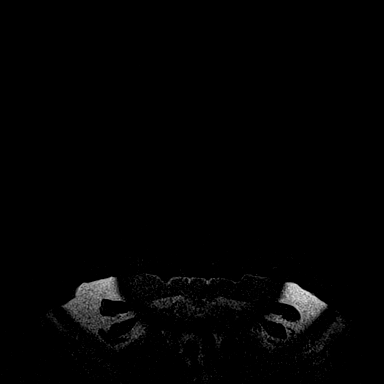

[Series 5: fl3d post-cm 20 · axial · 1.2mm · 1.04mm/px · z∈[-46,+125]mm · 5 of 144 slices shown (1 of 3)]
[im 1/144]
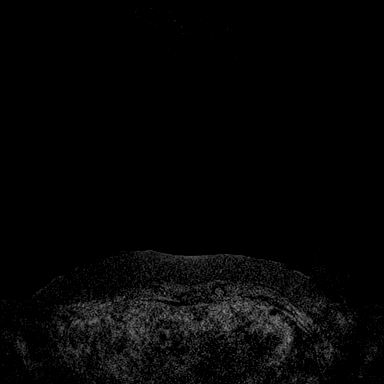
[im 36/144]
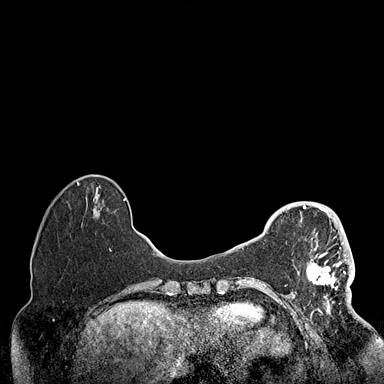
[im 72/144]
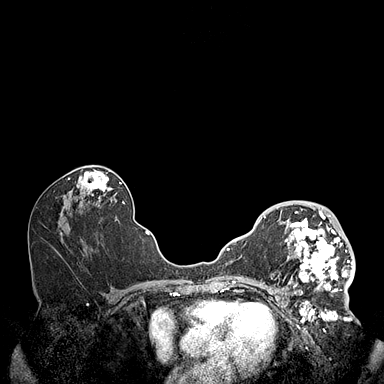
[im 108/144]
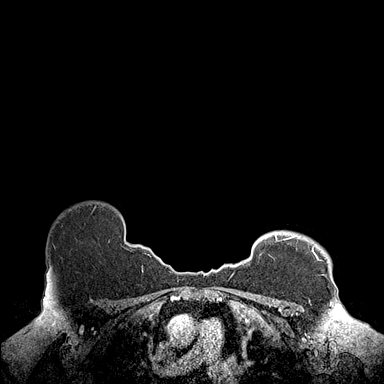
[im 144/144]
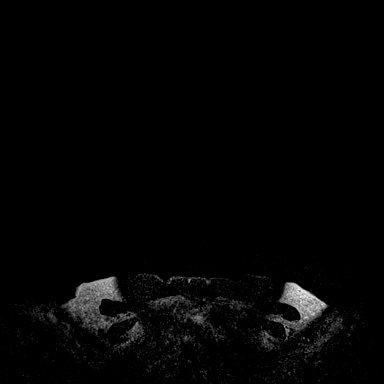

[Series 6: fl3d post-cm 20 · axial · 1.2mm · 1.04mm/px · z∈[-46,+125]mm · 5 of 144 slices shown (2 of 3)]
[im 1/144]
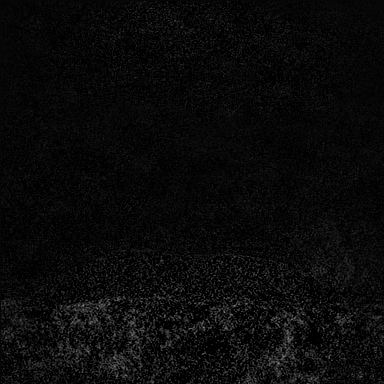
[im 36/144]
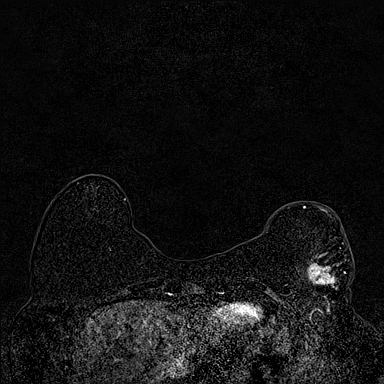
[im 72/144]
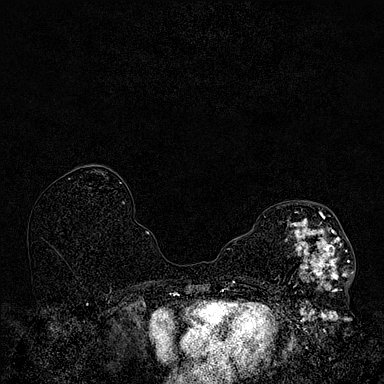
[im 108/144]
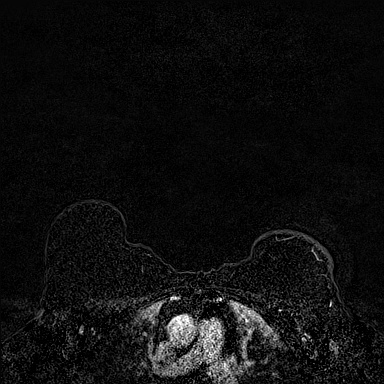
[im 144/144]
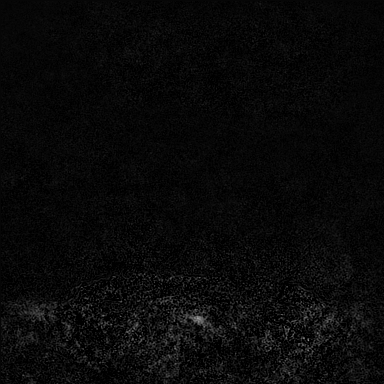

[Series 7: fl3d post-cm 20 · axial · 172.8mm · 1.04mm/px · 1 of 1 slices shown (3 of 3)]
[im 1/1]
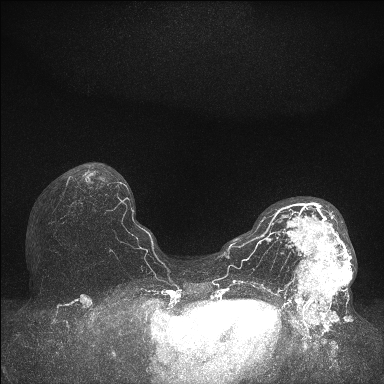

[Series 8: fl3d post-cm 3 · axial · 1.2mm · 1.04mm/px · z∈[-46,+125]mm · 5 of 144 slices shown (1 of 2)]
[im 1/144]
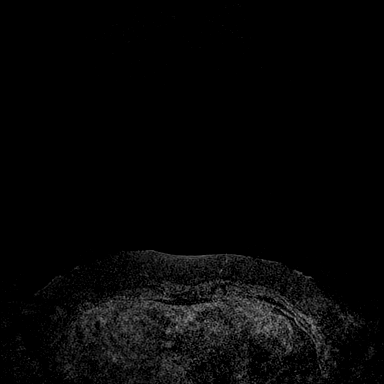
[im 36/144]
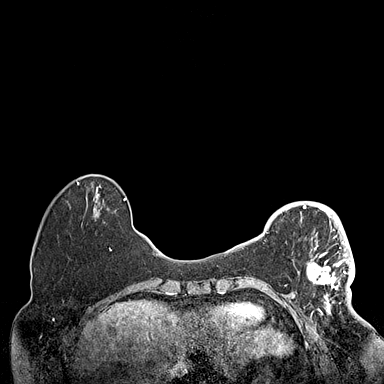
[im 72/144]
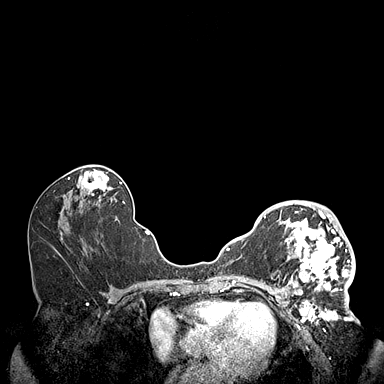
[im 108/144]
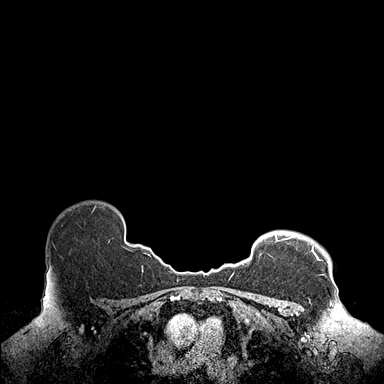
[im 144/144]
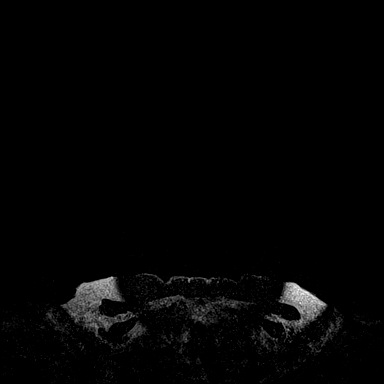

[Series 9: fl3d post-cm 3 · axial · 1.2mm · 1.04mm/px · z∈[-46,+22]mm · 3 of 144 slices shown (2 of 2)]
[im 1/144]
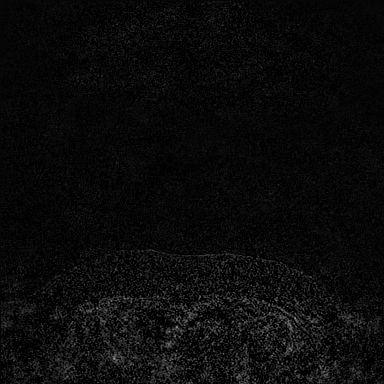
[im 29/144]
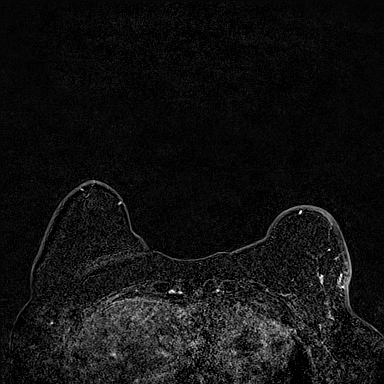
[im 58/144]
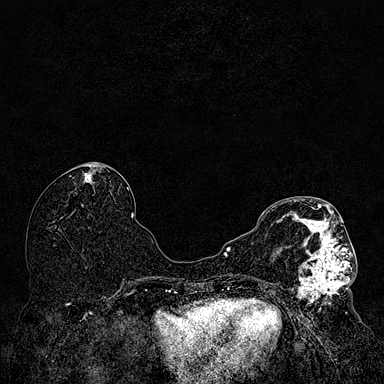

[30 of 48 positions shown; findings below may reference images not displayed]

Three-dimensional MR images were rendered by post-processing of the
original MR data on an independent workstation. The
three-dimensional MR images were interpreted, and findings are
reported in the following complete MRI report for this study. Three
dimensional images were evaluated at the independent interpreting
workstation using the DynaCAD thin client.
FINDINGS: Breast composition: c. Heterogeneous fibroglandular tissue.

Background parenchymal enhancement: Mild.

RIGHT breast: Blooming artifact in the subareolar location at the
site of the recent benign stereotactic core needle biopsy, located
within a hematoma which measures approximately 2.6 x 1.7 cm.
Immediately inferior to the hematoma and blooming artifact is linear
non-mass enhancement spanning approximately 1.5 x 0.9 x 0.8 cm (AP x
transverse x craniocaudal), demonstrating plateau enhancement
kinetics.

No suspicious mass or abnormal enhancement elsewhere. Benign T2
hyperintense and T1 hypointense cyst in the lower inner subareolar
location.

LEFT breast: Multiple enhancing masses with intervening non-mass
enhancement involving most of the LOWER OUTER QUADRANT and part of
the upper UPPER OUTER QUADRANT. The abnormal enhancement spans
approximately 12.4 x 6.4 x 7.0 cm (AP x transverse x craniocaudal),
demonstrating rapid washout enhancement kinetics. Blooming artifact
from the prior biopsies are present in the UPPER OUTER QUADRANT and
in the LOWER OUTER QUADRANT.

Extensive diffuse skin thickening/edema and trabecular thickening
throughout the breast on the T2 weighted images., with minimal
enhancement of the skin. Of note, the LEFT breast is significantly
smaller than the RIGHT.

Lymph nodes: Multiple LEFT axillary lymph nodes, the largest node
measuring approximately 2.4 cm in the low axilla, associated with
blooming artifact from the biopsy marker clip. There are at least 14
visible abnormal nodes.

No pathologic lymphadenopathy elsewhere.

Ancillary findings:  None.
IMPRESSION: 1. Multiple enhancing masses with intervening non-mass enhancement
involving most of the LOWER OUTER QUADRANT of the LEFT breast and
also involving the UPPER OUTER QUADRANT, spanning approximately
x 6.4 x 7.0 cm. The previously biopsied masses are present within
this extensive enhancement.
2. Diffuse skin thickening/edema and trabecular thickening
throughout the LEFT breast, with minimal enhancement of the skin,
likely indicating dermal involvement.
3. Multiple (at least 14) abnormal LEFT axillary lymph nodes. No
pathologic lymphadenopathy elsewhere.
4. Indeterminate Non-mass enhancement inferior to the biopsy site in
the subareolar RIGHT breast spanning 1.5 x 0.9 x 0.8 cm. This may
reflect post biopsy change, but is associated with indeterminate
plateau enhancement kinetics.

RECOMMENDATION:
1. MRI guided core needle biopsy of the non-mass enhancement in the
subareolar RIGHT breast.
2. Treatment plan for the extensive metastatic LEFT breast cancer.

BI-RADS CATEGORY  4: Suspicious.

## 2021-10-09 MED ORDER — GADOBUTROL 1 MMOL/ML IV SOLN
8.0000 mL | Freq: Once | INTRAVENOUS | Status: AC | PRN
  Administered 2021-10-09: 8 mL via INTRAVENOUS

## 2021-10-13 ENCOUNTER — Other Ambulatory Visit (INDEPENDENT_AMBULATORY_CARE_PROVIDER_SITE_OTHER): Payer: Managed Care, Other (non HMO) | Admitting: General Surgery

## 2021-10-13 DIAGNOSIS — N6341 Unspecified lump in right breast, subareolar: Secondary | ICD-10-CM

## 2021-10-13 NOTE — Progress Notes (Signed)
[facility]  MRI biopsy of the right breast ordered. Dr. Larna Daughters and I discussed. He feels she will need this regardless. He will be getting a PET given the nodes.   Will notify my office to get this arranged.   Curlene Labrum, MD [facility] [address] [place], [place] [address] [phone] (office)

## 2021-10-13 NOTE — Progress Notes (Signed)
Discussed MRI results with patient about the left breast which was expected and the concern about the right breast. May need an additional biopsy of that area. Will discuss with Dr. Delton Coombes.

## 2021-10-14 ENCOUNTER — Encounter (HOSPITAL_COMMUNITY): Payer: Self-pay

## 2021-10-14 ENCOUNTER — Other Ambulatory Visit: Payer: Self-pay | Admitting: General Surgery

## 2021-10-14 DIAGNOSIS — N6341 Unspecified lump in right breast, subareolar: Secondary | ICD-10-CM

## 2021-10-14 NOTE — Progress Notes (Signed)
Unable to reach patient for introductory phone call. I will plan to meet with the patient during initial visit with Dr. Delton Coombes on 10/15/2021

## 2021-10-15 ENCOUNTER — Inpatient Hospital Stay (HOSPITAL_COMMUNITY): Payer: Managed Care, Other (non HMO) | Attending: Hematology | Admitting: Hematology

## 2021-10-15 ENCOUNTER — Other Ambulatory Visit: Payer: Self-pay

## 2021-10-15 ENCOUNTER — Telehealth: Payer: Self-pay | Admitting: Licensed Clinical Social Worker

## 2021-10-15 ENCOUNTER — Encounter (HOSPITAL_COMMUNITY): Payer: Self-pay

## 2021-10-15 ENCOUNTER — Other Ambulatory Visit (HOSPITAL_COMMUNITY): Payer: Self-pay

## 2021-10-15 ENCOUNTER — Encounter (HOSPITAL_COMMUNITY): Payer: Self-pay | Admitting: Hematology

## 2021-10-15 VITALS — BP 127/81 | HR 70 | Temp 99.0°F | Resp 20 | Ht 61.02 in | Wt 171.0 lb

## 2021-10-15 DIAGNOSIS — C50919 Malignant neoplasm of unspecified site of unspecified female breast: Secondary | ICD-10-CM

## 2021-10-15 DIAGNOSIS — Z79899 Other long term (current) drug therapy: Secondary | ICD-10-CM | POA: Insufficient documentation

## 2021-10-15 DIAGNOSIS — C50912 Malignant neoplasm of unspecified site of left female breast: Secondary | ICD-10-CM | POA: Insufficient documentation

## 2021-10-15 DIAGNOSIS — F17210 Nicotine dependence, cigarettes, uncomplicated: Secondary | ICD-10-CM | POA: Insufficient documentation

## 2021-10-15 DIAGNOSIS — Z7982 Long term (current) use of aspirin: Secondary | ICD-10-CM | POA: Insufficient documentation

## 2021-10-15 DIAGNOSIS — C50812 Malignant neoplasm of overlapping sites of left female breast: Secondary | ICD-10-CM | POA: Diagnosis not present

## 2021-10-15 DIAGNOSIS — Z170 Estrogen receptor positive status [ER+]: Secondary | ICD-10-CM | POA: Diagnosis not present

## 2021-10-15 DIAGNOSIS — C773 Secondary and unspecified malignant neoplasm of axilla and upper limb lymph nodes: Secondary | ICD-10-CM | POA: Insufficient documentation

## 2021-10-15 LAB — CBC WITH DIFFERENTIAL/PLATELET
Abs Immature Granulocytes: 0.09 10*3/uL — ABNORMAL HIGH (ref 0.00–0.07)
Basophils Absolute: 0.1 10*3/uL (ref 0.0–0.1)
Basophils Relative: 1 %
Eosinophils Absolute: 0.2 10*3/uL (ref 0.0–0.5)
Eosinophils Relative: 2 %
HCT: 44.9 % (ref 36.0–46.0)
Hemoglobin: 14.5 g/dL (ref 12.0–15.0)
Immature Granulocytes: 1 %
Lymphocytes Relative: 12 %
Lymphs Abs: 1.3 10*3/uL (ref 0.7–4.0)
MCH: 30 pg (ref 26.0–34.0)
MCHC: 32.3 g/dL (ref 30.0–36.0)
MCV: 93 fL (ref 80.0–100.0)
Monocytes Absolute: 0.8 10*3/uL (ref 0.1–1.0)
Monocytes Relative: 7 %
Neutro Abs: 8.6 10*3/uL — ABNORMAL HIGH (ref 1.7–7.7)
Neutrophils Relative %: 77 %
Platelets: 337 10*3/uL (ref 150–400)
RBC: 4.83 MIL/uL (ref 3.87–5.11)
RDW: 14 % (ref 11.5–15.5)
WBC: 11.1 10*3/uL — ABNORMAL HIGH (ref 4.0–10.5)
nRBC: 0 % (ref 0.0–0.2)

## 2021-10-15 LAB — COMPREHENSIVE METABOLIC PANEL
ALT: 26 U/L (ref 0–44)
AST: 23 U/L (ref 15–41)
Albumin: 4.3 g/dL (ref 3.5–5.0)
Alkaline Phosphatase: 113 U/L (ref 38–126)
Anion gap: 8 (ref 5–15)
BUN: 24 mg/dL — ABNORMAL HIGH (ref 8–23)
CO2: 23 mmol/L (ref 22–32)
Calcium: 10.3 mg/dL (ref 8.9–10.3)
Chloride: 104 mmol/L (ref 98–111)
Creatinine, Ser: 1.25 mg/dL — ABNORMAL HIGH (ref 0.44–1.00)
GFR, Estimated: 48 mL/min — ABNORMAL LOW (ref >60)
Glucose, Bld: 101 mg/dL — ABNORMAL HIGH (ref 70–99)
Potassium: 4.4 mmol/L (ref 3.5–5.1)
Sodium: 135 mmol/L (ref 135–145)
Total Bilirubin: 0.6 mg/dL (ref 0.3–1.2)
Total Protein: 8.3 g/dL — ABNORMAL HIGH (ref 6.5–8.1)

## 2021-10-15 MED ORDER — ALPRAZOLAM 0.25 MG PO TABS: 0.2500 mg | ORAL_TABLET | Freq: Two times a day (BID) | ORAL | 0 refills | Status: DC | PRN

## 2021-10-15 NOTE — H&P (Signed)
[facility] History and Physical   Reason for Referral: Left breast cancer with positive nodes  Referring Physician: Celene Squibb, MD     Chief Complaint   New Traci Fuller (Initial Visit)        Traci Fuller is a 66 y.o. female.  HPI: Traci Fuller is a 66  yo who had Traci Fuller first mammogram this year. Traci Fuller has been noticing a lump in that area for over 3 years but this became larger and harder since September.  Traci Fuller has noted nipple retraction but no nipple drainage. Traci Fuller had a history of prior CAD and stent placement.    Traci Fuller had menarche at age 89, and Traci Fuller first pregnancy at age 65. Traci Fuller has a history of  breast cancer in Traci Fuller maternal grandmother and maternal 1/2 aunt.  Traci Fuller underwent menopause around 17.  Traci Fuller has not had any chest radiation.         Past Medical History:  Diagnosis Date   CAD (coronary artery disease)      anterior MI 12/2017 DES to midLAD, 75% stenosis of RCA not treated   Current smoker     Essential hypertension     Hyperlipidemia LDL goal <70             Past Surgical History:  Procedure Laterality Date   ABDOMINAL HYSTERECTOMY       CORONARY STENT INTERVENTION N/A 01/10/2018    Procedure: CORONARY STENT INTERVENTION;  Surgeon: Jettie Booze, MD;  Location: [facility];  Service: Cardiovascular;  Laterality: N/A;   LEFT HEART CATH AND CORONARY ANGIOGRAPHY N/A 01/10/2018    Procedure: LEFT HEART CATH AND CORONARY ANGIOGRAPHY;  Surgeon: Jettie Booze, MD;  Location: [facility];  Service: Cardiovascular;  Laterality: N/A;           Family History  Problem Relation Age of Onset   Hypertension Father        Social History         Tobacco Use   Smoking status: Every Day      Packs/day: 5.00      Years: 43.00      Pack years: 215.00      Types: Cigarettes      Start date: 1976   Smokeless tobacco: Never  Vaping Use   Vaping Use: Never used  Substance Use Topics   Alcohol use: Yes   Drug use: Never       Medications: I have reviewed Traci Fuller's current medications. Allergies as of 10/06/2021   No Known Allergies         Medication List           Accurate as of October 06, 2021 11:59 PM. If you have any questions, ask your nurse or doctor.              STOP taking these medications     atorvastatin 80 MG tablet Commonly known as: LIPITOR Stopped by: Virl Cagey, MD    levocetirizine 5 MG tablet Commonly known as: XYZAL Stopped by: Virl Cagey, MD    lisinopril 20 MG tablet Commonly known as: ZESTRIL Stopped by: Virl Cagey, MD           TAKE these medications     amLODipine 10 MG tablet Commonly known as: NORVASC Take 10 mg by mouth daily.    aspirin 81 MG EC tablet Take 1 tablet (81 mg total) by mouth daily.    cetirizine  10 MG tablet Commonly known as: ZYRTEC Take 10 mg by mouth daily as needed.    gabapentin 300 MG capsule Commonly known as: NEURONTIN Take 300 mg by mouth 3 (three) times daily.    metoprolol tartrate 25 MG tablet Commonly known as: LOPRESSOR Take 1 tablet (25 mg total) by mouth 2 (two) times daily. Please call and schedule overdue appointment with Dr. Gwenlyn Found for further refills . SECOND ATTEMPT!    nitroGLYCERIN 0.4 MG SL tablet Commonly known as: NITROSTAT Place 1 tablet (0.4 mg total) under Traci tongue every 5 (five) minutes x 3 doses as needed for chest pain.    ticagrelor 90 MG Tabs tablet Commonly known as: BRILINTA Take 1 tablet (90 mg total) by mouth 2 (two) times daily.               ROS:  A comprehensive review of systems was negative except for: Respiratory: positive for SOB Musculoskeletal: positive for back pain   Blood pressure (!) 143/86, pulse 74, temperature 98.6 F (37 C), temperature source Other (Comment), resp. rate 16, height 5\' 1"  (1.549 m), weight 173 lb (78.5 kg), SpO2 98 %. Physical Exam Vitals reviewed.  Constitutional:      Appearance: Normal appearance.  HENT:     Head:  Normocephalic.     Nose: Nose normal.     Mouth/Throat:     Mouth: Mucous membranes are moist.  Eyes:     Extraocular Movements: Extraocular movements intact.  Cardiovascular:     Rate and Rhythm: Normal rate and regular rhythm.  Pulmonary:     Effort: Pulmonary effort is normal.     Breath sounds: Normal breath sounds.  Chest:  Breasts:    Right: No inverted nipple, mass, nipple discharge or tenderness.     Left: Inverted nipple, mass and tenderness present. No nipple discharge.     Comments: 15+ cm mass in Traci lateral aspect of Traci left breast extending toward Traci nipple and superior, palpable nodes Abdominal:     General: There is no distension.     Palpations: Abdomen is soft.     Tenderness: There is no abdominal tenderness.  Musculoskeletal:        General: Normal range of motion.     Cervical back: Normal range of motion.  Lymphadenopathy:     Upper Body:     Right upper body: No supraclavicular or axillary adenopathy.     Left upper body: Axillary adenopathy present. No supraclavicular adenopathy.  Skin:    General: Skin is warm.  Neurological:     General: No focal deficit present.     Mental Status: Traci Fuller is alert.  Psychiatric:        Mood and Affect: Mood normal.        Behavior: Behavior normal.      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 This was found to be concordant by Dr. Franki Cabot.   Pathology results were discussed with Traci Fuller by telephone. Traci Fuller reported doing well after Traci biopsy with minimal to no tenderness at Traci site. Post biopsy instructions and care were reviewed and questions were answered. Traci Fuller was encouraged to call Traci [place] for any additional concerns. My direct phone number was provided.   Traci  Traci Fuller has a recent diagnosis of LEFT breast cancer and is scheduled to see  Dr. Curlene Labrum at [facility] [place] in [place], [place] on October 06, 2021.   Pathology results reported by Terie Purser, RN on 10/02/2021.     Electronically Signed   By: Franki Cabot M.D.   On: 10/05/2021 07:46   CLINICAL DATA:  Traci Fuller with indeterminate coarse heterogeneous calcifications in Traci RIGHT breast presents today for stereotactic biopsy.   Recent diagnosis of LEFT breast cancer.   EXAM: RIGHT BREAST STEREOTACTIC CORE NEEDLE BIOPSY   COMPARISON:  Previous exams.   FINDINGS: Traci Fuller and I discussed Traci procedure of stereotactic-guided biopsy including benefits and alternatives. We discussed Traci high likelihood of a successful procedure. We discussed Traci risks of Traci procedure including infection, bleeding, tissue injury, clip migration, and inadequate sampling. Informed written consent was given. Traci usual time out protocol was performed immediately prior to Traci procedure.   Using sterile technique and 1% Lidocaine as local anesthetic, under stereotactic guidance, a 9 gauge vacuum assisted device was used to perform core needle biopsy of calcifications in Traci upper outer quadrant of Traci RIGHT breast using a superior approach. Specimen radiograph was performed showing calcifications. Specimens with calcifications are identified for pathology.   Lesion quadrant: Upper outer quadrant   At Traci conclusion of Traci procedure, coil shaped tissue marker clip was deployed into Traci biopsy cavity. Follow-up 2-view mammogram was performed and dictated separately.   IMPRESSION: Stereotactic-guided biopsy of indeterminate coarse heterogeneous calcifications within Traci upper-outer quadrant of Traci RIGHT breast. No apparent complications.   Electronically Signed: By: Franki Cabot M.D. On: 10/01/2021 10:12   CLINICAL DATA:  Post biopsy mammogram of Traci left breast for  clip placement.   EXAM: 3D DIAGNOSTIC LEFT MAMMOGRAM POST ULTRASOUND BIOPSY   COMPARISON:  Previous exam(s).   FINDINGS: 3D Mammographic images were obtained following ultrasound guided biopsy of 2 sites in Traci left breast and 1 site in Traci low left axilla. Traci biopsy marking clips are in expected position at Traci sites of biopsy.   IMPRESSION: 1. Appropriate positioning of Traci ribbon shaped biopsy marking clip at Traci site of biopsy in Traci retroareolar lower outer left breast.   2. Appropriate positioning of Traci wing shaped biopsy marking clip in Traci upper-outer quadrant of Traci left breast.   3. Appropriate positioning Traci HydroMARK clip in Traci low left axilla.   Final Assessment: Post Procedure Mammograms for Marker Placement     Electronically Signed   By: Ammie Ferrier M.D.   On: 09/24/2021 09:28    CLINICAL DATA:  Traci Fuller presents with a firm palpable mass in Traci left breast.   EXAM: DIGITAL DIAGNOSTIC BILATERAL MAMMOGRAM WITH TOMOSYNTHESIS AND CAD; ULTRASOUND LEFT BREAST LIMITED; ULTRASOUND RIGHT BREAST LIMITED   TECHNIQUE: Bilateral digital diagnostic mammography and breast tomosynthesis was performed. Traci images were evaluated with computer-aided detection.; Targeted ultrasound examination of Traci left breast was performed.; Targeted ultrasound examination of Traci right breast was performed   COMPARISON:  None.   ACR Breast Density Category c: Traci breast tissue is heterogeneously dense, which may obscure small masses.   FINDINGS: In Traci left breast, a conglomeration of ill-defined masses with associated pleomorphic calcifications extends from Traci retroareolar upper outer quadrant for approximately 8 cm anterior to posterior. Within this conglomeration is a more defined mass measuring 2 cm. There is associated architectural distortion. Posterior to Traci mass is asymmetric density that extends towards Traci pectoralis muscle. In addition, posterior to Traci  conglomeration of masses is a second conglomeration of ill-defined masses without associated calcifications,  which extend towards Traci left axilla, spanning approximately 4 cm in long axis. There is also anterior breast skin thickening. There is a questionable thickened abnormal lymph node in Traci left axilla.   In Traci right breast, there is a mass measuring 8 mm that projects just medial to Traci nipple in Traci anterior breast. In Traci upper outer breast, more posteriorly, there is a partly obscured mass or masses. Extending from Traci nipple along Traci lateral breast are coarse heterogeneous calcifications, which span approximately 2.3 x 1.0 cm on Traci cc view. There is no defined associated mass or distortion. There is a questionable abnormal lymph node in Traci right axilla.   On physical exam, there is a firm mass that bulges Traci contour of Traci overlying skin along Traci lateral left breast.   Targeted left breast ultrasound is performed, showing a large irregular mass, difficult to accurately measure, in Traci left breast at 4 o'clock, 1 cm from Traci nipple, spanning approximately 5 x by 2 x 4 cm, with echogenic foci consistent with calcifications. At 12 o'clock, 5 cm Traci nipple, there is an irregular mass measuring 3.9 x 2.0 x 3.1 cm. At 2 o'clock, 7 cm Traci nipple, there are 2 adjacent irregular masses measuring 1.50.9 cm.   Sonographic evaluation of Traci left axilla demonstrates 3 abnormal lymph nodes, Traci most abnormal showing a focal cortical thickening of 7 mm.   Targeted ultrasound is performed, showing a cyst in Traci 3 o'clock retroareolar breast measuring 11 x 7 x 8 mm. In Traci right breast at 10 o'clock, 6 cm Traci nipple, there is an elongated cyst with 2 thin septations versus contiguous cysts that span 2.1 x 0.5 x 2.1 cm. These cysts are consistent size and location to Traci mammographic masses.   Sonographic evaluation Traci right axilla shows normal lymph nodes. No thickened or  abnormal nodes.   IMPRESSION: 1. Multiple left breast masses, Traci largest associated with pleomorphic calcifications and architectural distortion, highly suspicious for multifocal breast carcinoma. 2. Three abnormal left axillary lymph nodes suspicious for metastatic lymphadenopathy. 3. Coarse heterogeneous right breast calcifications extending laterally from Traci nipple spanning 2.3 cm in long axis. 4. Benign right breast cysts. No abnormal right axillary lymph nodes.   RECOMMENDATION: 1. Ultrasound-guided core needle biopsies of Traci left breast at 3 sites. Ultrasound biopsy of Traci dominant palpable mass at 4 o'clock, 1 cm from Traci nipple. Ultrasound-guided core needle biopsy of 1 of Traci additional masses more peripherally-recommend 1 of Traci 2 masses at 2 o'clock, 7 cm from Traci nipple. Ultrasound-guided core needle biopsy Traci dominant abnormal left axillary lymph node. 2. Stereotactic core needle biopsy of Traci right breast calcifications.   Traci Fuller will be scheduled for Traci ultrasound-guided left breast core needle biopsies prior to being discharged from Traci ultrasound department.   I have discussed Traci findings and recommendations with Traci Fuller. If applicable, a reminder letter will be sent to Traci Fuller regarding Traci next appointment.   BI-RADS CATEGORY  5: Highly suggestive of malignancy.     Electronically Signed   By: Lajean Manes M.D.   On: 09/10/2021 16:20    FINAL MICROSCOPIC DIAGNOSIS:   A. BREAST, 4:00, LEFT, BIOPSY:  -  Invasive mammary carcinoma  -  See comment   B. BREAST, 2:00, LEFT, BIOPSY:  -  Invasive ductal carcinoma  -  See comment   C. LYMPH NODE, AXILLA, LOW, LEFT, BIOPSY:  -  Metastatic carcinoma involving nodal tissue    Assessment &  Plan:  VEERA STAPLETON is a 66 y.o. female with a left breast cancer that is very large and involves several nodes. Traci Fuller is at least Stage III given Traci size and nodes.  I think Traci Fuller needs to get  neoadjuvant therapy to shrink this and further work up with MRI and possibly PET given Traci timeframe we are talking.    MRI ordered Oncology seeing next week, stat referral  Cardiology for risk stratification as Traci Fuller has not seen them in over 2-3 years after Traci Fuller stent was placed  Discussed port placement pending Dr. Tomie China thoughts and risk of bleeding, infection, pneumothorax, injury to vessels.  Can get Traci Fuller set up once he is in agreement.          Future Appointments  Date Time Provider [place]  10/15/2021  8:15 AM Derek Jack, MD [facility] None  10/16/2021  1:55 PM Lendon Colonel, NP [facility]        All questions were answered to Traci satisfaction of Traci Fuller and family.   I spent over 60 minutes in this visit was spent in counseling/ coordination of care regarding Traci left breast cancer.      Virl Cagey 10/06/2021

## 2021-10-15 NOTE — Telephone Encounter (Signed)
[facility] Breast Cancer STAT ordered placed 10/15/2021

## 2021-10-15 NOTE — Progress Notes (Signed)
Labs drawn from [facility] using 23G butterfly needle. Patient tolerated well without complaint. Site covered with gauze and adhesive bandage. Patient discharged from clinic ambulatory and in stable condition with daughter.

## 2021-10-15 NOTE — Progress Notes (Signed)
[facility] [address] [place] [address]   Patient Care Team: Celene Squibb, MD as PCP - General (Internal Medicine) Lorretta Harp, MD as PCP - Cardiology (Cardiology) Derek Jack, MD as Medical Oncologist (Medical Oncology) Brien Mates, RN as Oncology Nurse Navigator (Medical Oncology)  CHIEF COMPLAINTS/PURPOSE OF CONSULTATION:  Newly diagnosed locally advanced left breast cancer  HISTORY OF PRESENTING ILLNESS:  Traci Fuller 66 y.o. female is here because of recent diagnosis of locally advanced left breast cancer.   Today she reports feeling good, and she is accompanied by her daughter. She reports having a palpable lump in her left breast for over 1 year which began to grow in size and deform starting in September 2022. She reports occasional sharp pain at the site of the lump lasting for a few seconds before resolving. She also reports the site of the lump in the left breast itches. She denies previous history of biopsy. She denies weight loss, fevers, hot flashes, new pains, and night sweats. She had a MI in 2019 at which time 2 stents were placed. She denies tingling/numbness, headaches, and vision changes. She had shingles 2 years ago which caused chronic burning pain in her face for which she now takes Gabapentin. She reports anxiety which is disrupting her sleep.  She currently lives at home with her husband, daughter, and grandchildren. 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 She smokes 1/2 ppd and has smoked for 46 years. Her maternal grandmother had ovarian cancer, her brother passed from leukemia, another brother passed from liver cancer, her mother's half sister had breast cancer, and her mother had cervical cancer treated with hysterectomy and she passed from bladder cancer.  In terms of breast cancer risk profile:  She menarched at early age of 43 and had a hysterectomy at the age of 6 due family history She had 1 pregnancy,  her first child was born at age 89  She had received birth control pills for approximately 12-14 years.  She was never exposed to fertility medications or hormone replacement therapy.  She has family history of Breast/GYN/GI cancer  I reviewed her records extensively and collaborated the history with the patient.  SUMMARY OF ONCOLOGIC HISTORY: Oncology History   No history exists.    MEDICAL HISTORY:  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 DES to midLAD, 75% stenosis of RCA not treated   Current smoker    Essential hypertension    Hyperlipidemia LDL goal <70     SURGICAL HISTORY: Past Surgical History:  Procedure Laterality Date   ABDOMINAL HYSTERECTOMY     CORONARY STENT INTERVENTION N/A 01/10/2018   Procedure: CORONARY STENT INTERVENTION;  Surgeon: Jettie Booze, MD;  Location: [facility];  Service: Cardiovascular;  Laterality: N/A;   LEFT HEART CATH AND CORONARY ANGIOGRAPHY N/A 01/10/2018   Procedure: LEFT HEART CATH AND CORONARY ANGIOGRAPHY;  Surgeon: Jettie Booze, MD;  Location: [facility];  Service: Cardiovascular;  Laterality: N/A;    SOCIAL HISTORY: Social History   Socioeconomic History   Marital status: Married    Spouse name: Not on file   Number of children: Not on file   Years of education: Not on file   Highest education level: Not on file  Occupational History   Not on file  Tobacco Use   Smoking status: Every Day    Packs/day: 5.00  Years: 43.00    Pack years: 215.00    Types: Cigarettes    Start date: 1976   Smokeless tobacco: Never  Vaping Use   Vaping Use: Never used  Substance and Sexual Activity   Alcohol use: Yes   Drug use: Never   Sexual activity: Not on file  Other Topics Concern   Not on file  Social History Narrative   Not on file   Social Determinants of Health   Financial Resource Strain: Not on file  Food Insecurity: Not on file   Transportation Needs: Not on file  Physical Activity: Not on file  Stress: Not on file  Social Connections: Not on file  Intimate Partner Violence: Not on file    FAMILY HISTORY: Family History  Problem Relation Age of Onset   Hypertension Father     ALLERGIES:  has No Known Allergies.  MEDICATIONS:  Current Outpatient Medications  Medication Sig Dispense Refill   amLODipine (NORVASC) 10 MG tablet Take 10 mg by mouth daily.     aspirin EC 81 MG EC tablet Take 1 tablet (81 mg total) by mouth daily. 90 tablet 3   cetirizine (ZYRTEC) 10 MG tablet Take 10 mg by mouth daily as needed.      co-enzyme Q-10 30 MG capsule Take 30 mg by mouth 3 (three) times daily.     gabapentin (NEURONTIN) 300 MG capsule Take 300 mg by mouth 3 (three) times daily.     metoprolol tartrate (LOPRESSOR) 25 MG tablet Take 1 tablet (25 mg total) by mouth 2 (two) times daily. Please call and schedule overdue appointment with Dr. Gwenlyn Found for further refills . SECOND ATTEMPT! 30 tablet 0   nitroGLYCERIN (NITROSTAT) 0.4 MG SL tablet Place 1 tablet (0.4 mg total) under the tongue every 5 (five) minutes x 3 doses as needed for chest pain. 25 tablet 1   omeprazole (PRILOSEC) 20 MG capsule Take 20 mg by mouth daily.     ticagrelor (BRILINTA) 90 MG TABS tablet Take 1 tablet (90 mg total) by mouth 2 (two) times daily. 180 tablet 3   ALPRAZolam (XANAX) 0.25 MG tablet Take 1 tablet (0.25 mg total) by mouth 2 (two) times daily as needed for anxiety. 60 tablet 0   No current facility-administered medications for this visit.    REVIEW OF SYSTEMS:   Review of Systems  Constitutional:  Negative for appetite change, fatigue, fever and unexpected weight change.  Eyes:  Negative for eye problems.  Endocrine: Negative for hot flashes.  Skin:  Positive for itching (L breast).  Neurological:  Positive for numbness (face). Negative for headaches.  Psychiatric/Behavioral:  Positive for sleep disturbance. The patient is  nervous/anxious.   All other systems reviewed and are negative.  PHYSICAL EXAMINATION: ECOG PERFORMANCE STATUS: 1 - Symptomatic but completely ambulatory  Vitals:   10/15/21 0806  BP: 127/81  Pulse: 70  Resp: 20  Temp: 99 F (37.2 C)  SpO2: 98%   Filed Weights   10/15/21 0806  Weight: 171 lb (77.6 kg)   Physical Exam Vitals reviewed.  Constitutional:      Appearance: Normal appearance. She is obese.  Cardiovascular:     Rate and Rhythm: Normal rate and regular rhythm.     Pulses: Normal pulses.     Heart sounds: Normal heart sounds.  Pulmonary:     Effort: Pulmonary effort is normal.     Breath sounds: Normal breath sounds.  Chest:  Breasts:    Right: Skin change (ecchymosis  in LOQ areola) present. No swelling, bleeding, inverted nipple, mass, nipple discharge or tenderness.     Left: Inverted nipple (flattened), mass (12-14 cm predominately involving LOQ and LLQ) and skin change (slightly contracted; warm to touch) present. No swelling, bleeding, nipple discharge or tenderness.  Lymphadenopathy:     Upper Body:     Right upper body: No supraclavicular, axillary or pectoral adenopathy.     Left upper body: Axillary adenopathy (palpable; 1 mass 2 cm in size) present. No supraclavicular or pectoral adenopathy.  Neurological:     General: No focal deficit present.     Mental Status: She is alert and oriented to person, place, and time.  Psychiatric:        Mood and Affect: Mood normal.        Behavior: Behavior normal.    Breast Exam Chaperone: Thana Ates    LABORATORY DATA:  I have reviewed the data as listed Recent Results (from the past 2160 hour(s))  Surgical pathology     Status: None   Collection Time: 09/24/21  9:30 AM  Result Value Ref Range   SURGICAL PATHOLOGY      SURGICAL PATHOLOGY * THIS IS AN ADDENDUM REPORT * CASE: [id number] PATIENT:  [facility] **Addendum **  Reason for Addendum #1:  Breast Biomarker  Results  Clinical History: patient presented with palpable mass in the left breast     FINAL MICROSCOPIC DIAGNOSIS:  A. BREAST, 4:00, LEFT, BIOPSY: -  Invasive mammary carcinoma -  See comment  B. BREAST, 2:00, LEFT, BIOPSY: -  Invasive ductal carcinoma -  See comment  C. LYMPH NODE, AXILLA, LOW, LEFT, BIOPSY: -  Metastatic carcinoma involving nodal tissue  COMMENT:  A.  The biopsy material shows an infiltrative proliferation of arranged linearly and in small clusters. Based on the biopsy, the carcinoma appears Nottingham grade 2 of 3 and measures 1.2 cm in greatest linear extent. E-cadherin and prognostic markers (ER/PR/ki-67/HER2)are pending and will be reported in an addendum.  Dr. Saralyn Pilar reviewed the case and agrees with the above diagnosis.  B.  Bas ed on the biopsy, the carcinoma appears Nottingham grade 2 of 3 and measures 0.8 cm in greatest linear extent. Prognostic markers (ER/PR/ki-67/HER2) are pending and will be reported in an addendum.  GROSS DESCRIPTION:  A: The specimen is received in formalin and labeled with the patient's name and left breast biopsy at 4:00, and consists of 4 cores of tan-yellow fibroadipose tissue, ranging from 1.4 x 0.2 x 0.2 cm to 2.1 x 0.2 x 0.2 cm.  The specimen is entirely submitted in 1 cassette. Time in formalin 8:50 AM on 09/24/2021.  Cold ischemic time 1 minute.  B: The specimen is received in formalin labeled with the patient's name and left breast biopsy 2:00, and consists of 3 cores of tan-yellow fibroadipose tissue, ranging from 2.0 x 0.2 x 0.2 cm to 2.2 x 0.2 x 0.2 cm.  The specimen is entirely submitted in 1 cassette. Time in formalin 8:57 AM on 09/24/2021.  Cold ischemic time 1 minute.  C: The specimen is received in formalin labeled with the patient's name and left brea st biopsy axilla, and consists of 4 cores of tan fibroadipose tissue, ranging from 1.9 x 0.2 x 0.2 cm to 2.1 x 0.2 x 0.2 cm.  The specimen is entirely  submitted in 1 cassette. Time in formalin 9:03 AM on 09/24/2021.  Cold ischemic time 1 minute.  Craig Staggers 09/24/2021)    Final Diagnosis performed  by Thressa Sheller, MD.   Electronically signed 09/25/2021 Technical component performed at [facility] [address], [place], [place] [address].  Professional component performed at [employer]. [facility], [place] [address], [place], [place] [address].  Immunohistochemistry Technical component (if applicable) was performed at [facility]. [address] [place], [place], [place] [address].   IMMUNOHISTOCHEMISTRY DISCLAIMER (if applicable): Some of these immunohistochemical stains may have been developed and the performance characteristics determine by [facility]. Some may not have been cleared or approved by the U.S. Food and Dru g Administration. The FDA has determined that such clearance or approval is not necessary. This test is used for clinical purposes. It should not be regarded as investigational or for research. This laboratory is certified under the [place] (CLIA-88) as qualified to perform high complexity clinical laboratory testing.  The controls stained appropriately.  ADDENDUM:  1.  PROGNOSTIC INDICATOR RESULTS:  Immunohistochemical and morphometric analysis performed manually  Estrogen Receptor: POSITIVE, 100% STRONG STAINING INTENSITY Progesterone Receptor: POSITIVE, 70% STRONG STAINING INTENSITY Proliferation Marker Ki-67: 25%    Reference Range Estrogen and Progesterone Receptor       Negative  0%      Positive  >1%  All controls stained appropriately.  FLOURESCENCE IN-SITU HYBRIDIZATION RESULTS:  GROUP 1:  HER2 **POSITIVE**  On the tissue sample received from this individual HER2 FISH was performed by a technologist and  cell imaging and analysis on the BioView.  RATIO of HER2/CEN 17 SIGNALS: 2.19 AVERAGE HER2 COPY  NUMBER PER CELL: 4.70  The ratio of HER2/CEN 17 result exceeds the cutoff value of >=2.0 and a copy number of HER2 signals exceeding the cutoff range of >=4.0 signals per cell.  Arch Pathol Lab Med 1:1, 2018.   2.  PROGNOSTIC INDICATOR RESULTS:  Immunohistochemical and morphometric analysis performed manually  Estrogen Receptor: POSITIVE, 100% STRONG STAINING INTENSITY Progesterone Receptor: POSITIVE, 70% STRONG STAINING INTENSITY Proliferation Marker Ki-67: 25%    Reference Range Estrogen and Progesterone Receptor      Negative  0%      Positive  >1%  All controls stained appropriately.  FLOURESCENCE IN-SITU HYBRIDIZATION RESULTS:  GROUP 5:   HER2 **NEGATIVE**  On the tissue sample received from this individual HER2 FISH was performed by a technologist and cell imaging and analysis on the BioView.  RATIO of HER2/CEN 17 SIGNALS: 1.11 AVERAGE HER2 COPY NUM BER PER CELL: 2.00  The ratio of HER-2 CEP 17 is within the range < 2.0 of HER-2: CEP 17 in normal breast tissue and a copy number of HER2 signals per cell is <4.0. Arch Pathol Lab Med 1:1,2018.   3.  PROGNOSTIC INDICATOR RESULTS:  Immunohistochemical and morphometric analysis performed manually  Estrogen Receptor: POSITIVE, 100% STRONG STAINING INTENSITY Progesterone Receptor: POSITIVE, 50% STRONG STAINING INTENSITY   Reference Range Estrogen and Progesterone Receptor      Negative  0%      Positive  >1%  All controls stained appropriately.  FLOURESCENCE IN-SITU HYBRIDIZATION RESULTS:  GROUP 5:   HER2 **NEGATIVE**  On the tissue sample received from this individual HER2 FISH was performed by a technologist and cell imaging and analysis on the BioView.  RATIO of HER2/CEN 17 SIGNALS: 1.32 AVERAGE HER2 COPY NUMBER PER CELL: 2.45  The ratio of HER-2 CEP 17 is within the range < 2.0 of HER-2: CEP 17 in normal breast tissue and a copy number of HER2 signals  per cell  is <4.0. Arch Pathol Lab Med  1:1,2018.  E-cadherin is positive supporting a ductal origin.     Addendum #1 performed by Thressa Sheller, MD.   Electronically signed 09/30/2021 Technical component performed at [facility] [place] [address] [place], [place] [address].  Professional component performed at [employer]. [facility], [place] [address] [place], [place] [address].  Immunohistochemistry Technical component (if applicable) was performed at [facility]. [address] [place], [place], [place] [address].   IMMUNOHISTOCHEMISTRY DISCLAIMER (if applicable): Some of these immunohistochemical stains may have been developed and the performance characteristics determine by [facility]. Some may not have been cleared or approved by the U.S. Food and Drug Administration. The FDA has determined that such clearance or approval is not necessary. This test is used for clinical purposes. It should not be regarded a s investigational or for research. This laboratory is certified under the [place] (CLIA-88) as qualified to perform high complexity clinical laboratory testing.  The controls stained appropriately.     RADIOGRAPHIC STUDIES: I have personally reviewed the radiological reports and agreed with the findings in the report. MR Breast Bilateral W Wo Contrast  Result Date: 10/09/2021 CLINICAL DATA:  66 year old with recent diagnosis of multicentric LEFT breast invasive malignancy in the [place] and the LOWER OUTER QUADRANT and biopsy-proven metastatic disease to a LEFT axillary lymph node. She also had a benign core needle biopsy of calcifications in the UPPER OUTER QUADRANT of the RIGHT breast with pathology demonstrating fibroadenoma. Preoperative MRI evaluation to confirm extent of disease prior to definitive treatment. EXAM: BILATERAL BREAST MRI WITH AND WITHOUT CONTRAST TECHNIQUE: Multiplanar, multisequence MR  images of both breasts were obtained prior to and following the intravenous administration of 8 ml of Gadavist. Three-dimensional MR images were rendered by post-processing of the original MR data on an independent workstation. The three-dimensional MR images were interpreted, and findings are reported in the following complete MRI report for this study. Three dimensional images were evaluated at the independent interpreting workstation using the DynaCAD thin client. COMPARISON:  No prior MRI. Multiple recent mammograms and breast ultrasounds. FINDINGS: Breast composition: c. Heterogeneous fibroglandular tissue. Background parenchymal enhancement: Mild. RIGHT breast: Blooming artifact in the subareolar location at the site of the recent benign stereotactic core needle biopsy, located within a hematoma which measures approximately 2.6 x 1.7 cm. Immediately inferior to the hematoma and blooming artifact is linear non-mass enhancement spanning approximately 1.5 x 0.9 x 0.8 cm (AP x transverse x craniocaudal), demonstrating plateau enhancement kinetics. No suspicious mass or abnormal enhancement elsewhere. Benign T2 hyperintense and T1 hypointense cyst in the lower inner subareolar location. LEFT breast: Multiple enhancing masses with intervening non-mass enhancement involving most of the LOWER OUTER QUADRANT and part of the upper UPPER OUTER QUADRANT. The abnormal enhancement spans approximately 12.4 x 6.4 x 7.0 cm (AP x transverse x craniocaudal), demonstrating rapid washout enhancement kinetics. Blooming artifact from the prior biopsies are present in the [place] and in the [place]. Extensive diffuse skin thickening/edema and trabecular thickening throughout the breast on the T2 weighted images., with minimal enhancement of the skin. Of note, the LEFT breast is significantly smaller than the RIGHT. Lymph nodes: Multiple LEFT axillary lymph nodes, the largest node measuring approximately 2.4  cm in the low axilla, associated with blooming artifact from the biopsy marker clip. There are at least 14 visible abnormal nodes. No pathologic lymphadenopathy elsewhere. Ancillary findings:  None. IMPRESSION: 1. Multiple enhancing masses with intervening non-mass enhancement involving most of the LOWER OUTER QUADRANT of the LEFT breast and also involving the [place], spanning approximately 12.4 x 6.4 x 7.0 cm. The previously biopsied masses are present within this extensive enhancement. 2. Diffuse skin thickening/edema and trabecular thickening throughout the LEFT breast, with minimal enhancement of the skin, likely indicating dermal involvement. 3. Multiple (at least 14) abnormal LEFT axillary lymph nodes. No pathologic lymphadenopathy elsewhere. 4. Indeterminate Non-mass enhancement inferior to the biopsy site in the subareolar RIGHT breast spanning 1.5 x 0.9 x 0.8 cm. This may reflect post biopsy change, but is associated with indeterminate plateau enhancement kinetics. RECOMMENDATION: 1. MRI guided core needle biopsy of the non-mass enhancement in the subareolar RIGHT breast. 2. Treatment plan for the extensive metastatic LEFT breast cancer. BI-RADS CATEGORY  4: Suspicious. Electronically Signed   By: Evangeline Dakin M.D.   On: 10/09/2021 12:09  MM CLIP PLACEMENT LEFT  Result Date: 09/24/2021 CLINICAL DATA:  Post biopsy mammogram of the left breast for clip placement. EXAM: 3D DIAGNOSTIC LEFT MAMMOGRAM POST ULTRASOUND BIOPSY COMPARISON:  Previous exam(s). FINDINGS: 3D Mammographic images were obtained following ultrasound guided biopsy of 2 sites in the left breast and 1 site in the low left axilla. The biopsy marking clips are in expected position at the sites of biopsy. IMPRESSION: 1. Appropriate positioning of the ribbon shaped biopsy marking clip at the site of biopsy in the retroareolar lower outer left breast. 2. Appropriate positioning of the wing shaped biopsy marking clip in the  upper-outer quadrant of the left breast. 3. Appropriate positioning the HydroMARK clip in the low left axilla. Final Assessment: Post Procedure Mammograms for Marker Placement Electronically Signed   By: Ammie Ferrier M.D.   On: 09/24/2021 09:28  MM CLIP PLACEMENT RIGHT  Result Date: 10/01/2021 CLINICAL DATA:  Status post stereotactic biopsy for indeterminate coarse heterogeneous calcifications in the outer RIGHT breast. EXAM: 3D DIAGNOSTIC RIGHT MAMMOGRAM POST STEREOTACTIC BIOPSY COMPARISON:  Previous exam(s). FINDINGS: 3D Mammographic images were obtained following stereotactic guided biopsy of indeterminate calcifications within the upper-outer quadrant of the RIGHT breast. The biopsy marking clip is in expected position at the site of biopsy. IMPRESSION: Appropriate positioning of the coil shaped biopsy marking clip at the site of biopsy in the upper-outer quadrant of the RIGHT breast. Final Assessment: Post Procedure Mammograms for Marker Placement Electronically Signed   By: Franki Cabot M.D.   On: 10/01/2021 10:13  [place] LT BREAST BX W LOC DEV 1ST LESION IMG BX SPEC US GUIDE  Addendum Date: 09/26/2021   ADDENDUM REPORT: 09/26/2021 07:47 ADDENDUM: PATHOLOGY revealed: Site A. BREAST, 4:00, LEFT, BIOPSY: - Invasive mammary carcinoma. COMMENT: the biopsy material shows an infiltrative proliferation of arranged linearly and in small clusters. Based on the biopsy, the carcinoma appears Nottingham grade 2 of 3 and measures 1.2 cm in greatest linear extent. Pathology results are CONCORDANT with imaging findings, per Dr. Ammie Ferrier. PATHOLOGY revealed: Site B. BREAST, 2:00, LEFT, BIOPSY: - Invasive ductal carcinoma. COMMENT: Based on the biopsy, the carcinoma appears Nottingham grade 2 of 3 and measures 0.8 cm in greatest linear extent. Pathology results are CONCORDANT with imaging findings, per Dr. Ammie Ferrier. PATHOLOGY revealed: Site C. LYMPH NODE, AXILLA, LOW, LEFT, BIOPSY: - Metastatic  carcinoma involving nodal tissue. Pathology results are CONCORDANT with imaging findings, per Dr. Ammie Ferrier. Pathology results and recommendations below were discussed with patient by telephone on 09/25/2021. Patient reported biopsy site within normal  limits with slight tenderness at the site. Post biopsy care instructions were reviewed, questions were answered and my direct phone number was provided to patient. Patient was instructed to call [facility] Mammography Department if any concerns or questions arise related to the biopsy. RECOMMENDATIONS: 1. Surgical consultation. Request for surgical consultation was relayed to Kathi Der RT at [facility] Mammography Department by Electa Sniff RN on 09/25/2021. 2. Patient is scheduled for a RIGHT breast stereotactic guided biopsy at [facility] on 10/01/2021 for RIGHT breast calcifications. Pathology results reported by Electa Sniff RN on 09/25/2021. Electronically Signed   By: Ammie Ferrier M.D.   On: 09/26/2021 07:47   Result Date: 09/26/2021 CLINICAL DATA:  66 year old female presenting for ultrasound-guided biopsy of 2 masses in the left breast and a left axillary lymph node. EXAM: ULTRASOUND GUIDED LEFT BREAST CORE NEEDLE BIOPSY COMPARISON:  Previous exam(s). PROCEDURE: I met with the patient and we discussed the procedure of ultrasound-guided biopsy, including benefits and alternatives. We discussed the high likelihood of a successful procedure. We discussed the risks of the procedure, including infection, bleeding, tissue injury, clip migration, and inadequate sampling. Informed written consent was given. The usual time-out protocol was performed immediately prior to the procedure. #1 Lesion quadrant: Lower outer quadrant Using sterile technique and 1% Lidocaine as local anesthetic, under direct ultrasound visualization, a 14 gauge spring-loaded device was used to perform biopsy of a mass in the left  breast at 4 o'clock, 1 cm from the nipple using an inferior approach. At the conclusion of the procedure a ribbon shaped tissue marker clip was deployed into the biopsy cavity. --------------------------------------------------------------------------- ----------------------------------------------------------------- #2 Lesion quadrant: Upper outer quadrant Using sterile technique and 1% Lidocaine as local anesthetic, under direct ultrasound visualization, a 14 gauge spring-loaded device was used to perform biopsy of a mass in the left breast at 2 o'clock, 7 cm from the nipple using a lateral approach. At the conclusion of the procedure a wing shaped tissue marker clip was deployed into the biopsy cavity. --------------------------------------------------------------------------- ----------------------------------------------------------------- #3 Lesion quadrant: Left axilla Using sterile technique and 1% Lidocaine as local anesthetic, under direct ultrasound visualization, a 14 gauge spring-loaded device was used to perform biopsy of a left axillary lymph node using a inferior approach. At the conclusion of the procedure a HydroMARK tissue marker clip was deployed into the biopsy cavity. Follow up 2 view mammogram was performed and dictated separately. IMPRESSION: 1. Ultrasound guided biopsy of a left breast mass at 4 o'clock (ribbon clip). No apparent complications. 2. Ultrasound guided biopsy of a left breast mass at 2 o'clock (wing clip). No apparent complications. 3. Ultrasound guided biopsy of a left axillary lymph node (HydroMARK clip). No apparent complications. Electronically Signed: By: Ammie Ferrier M.D. On: 09/24/2021 09:18  [place] LT BREAST BX W LOC DEV EA ADD LESION IMG BX SPEC US GUIDE  Addendum Date: 09/26/2021   ADDENDUM REPORT: 09/26/2021 07:47 ADDENDUM: PATHOLOGY revealed: Site A. BREAST, 4:00, LEFT, BIOPSY: - Invasive mammary carcinoma. COMMENT: the biopsy material shows an infiltrative  proliferation of arranged linearly and in small clusters. Based on the biopsy, the carcinoma appears Nottingham grade 2 of 3 and measures 1.2 cm in greatest linear extent. Pathology results are CONCORDANT with imaging findings, per Dr. Ammie Ferrier. PATHOLOGY revealed: Site B. BREAST, 2:00, LEFT, BIOPSY: - Invasive ductal carcinoma. COMMENT: Based on the biopsy, the carcinoma appears Nottingham grade 2 of 3 and measures 0.8 cm in greatest linear extent.  Pathology results are CONCORDANT with imaging findings, per Dr. Ammie Ferrier. PATHOLOGY revealed: Site C. LYMPH NODE, AXILLA, LOW, LEFT, BIOPSY: - Metastatic carcinoma involving nodal tissue. Pathology results are CONCORDANT with imaging findings, per Dr. Ammie Ferrier. Pathology results and recommendations below were discussed with patient by telephone on 09/25/2021. Patient reported biopsy site within normal limits with slight tenderness at the site. Post biopsy care instructions were reviewed, questions were answered and my direct phone number was provided to patient. Patient was instructed to call [facility] Mammography Department if any concerns or questions arise related to the biopsy. RECOMMENDATIONS: 1. Surgical consultation. Request for surgical consultation was relayed to Kathi Der RT at [facility] Mammography Department by Electa Sniff RN on 09/25/2021. 2. Patient is scheduled for a RIGHT breast stereotactic guided biopsy at [place] on 10/01/2021 for RIGHT breast calcifications. Pathology results reported by Electa Sniff RN on 09/25/2021. Electronically Signed   By: Ammie Ferrier M.D.   On: 09/26/2021 07:47   Result Date: 09/26/2021 CLINICAL DATA:  66 year old female presenting for ultrasound-guided biopsy of 2 masses in the left breast and a left axillary lymph node. EXAM: ULTRASOUND GUIDED LEFT BREAST CORE NEEDLE BIOPSY COMPARISON:  Previous exam(s). PROCEDURE: I met with the patient  and we discussed the procedure of ultrasound-guided biopsy, including benefits and alternatives. We discussed the high likelihood of a successful procedure. We discussed the risks of the procedure, including infection, bleeding, tissue injury, clip migration, and inadequate sampling. Informed written consent was given. The usual time-out protocol was performed immediately prior to the procedure. #1 Lesion quadrant: Lower outer quadrant Using sterile technique and 1% Lidocaine as local anesthetic, under direct ultrasound visualization, a 14 gauge spring-loaded device was used to perform biopsy of a mass in the left breast at 4 o'clock, 1 cm from the nipple using an inferior approach. At the conclusion of the procedure a ribbon shaped tissue marker clip was deployed into the biopsy cavity. --------------------------------------------------------------------------- ----------------------------------------------------------------- #2 Lesion quadrant: Upper outer quadrant Using sterile technique and 1% Lidocaine as local anesthetic, under direct ultrasound visualization, a 14 gauge spring-loaded device was used to perform biopsy of a mass in the left breast at 2 o'clock, 7 cm from the nipple using a lateral approach. At the conclusion of the procedure a wing shaped tissue marker clip was deployed into the biopsy cavity. --------------------------------------------------------------------------- ----------------------------------------------------------------- #3 Lesion quadrant: Left axilla Using sterile technique and 1% Lidocaine as local anesthetic, under direct ultrasound visualization, a 14 gauge spring-loaded device was used to perform biopsy of a left axillary lymph node using a inferior approach. At the conclusion of the procedure a HydroMARK tissue marker clip was deployed into the biopsy cavity. Follow up 2 view mammogram was performed and dictated separately. IMPRESSION: 1. Ultrasound guided biopsy of a left  breast mass at 4 o'clock (ribbon clip). No apparent complications. 2. Ultrasound guided biopsy of a left breast mass at 2 o'clock (wing clip). No apparent complications. 3. Ultrasound guided biopsy of a left axillary lymph node (HydroMARK clip). No apparent complications. Electronically Signed: By: Ammie Ferrier M.D. On: 09/24/2021 09:18  [place] LT BREAST BX W LOC DEV EA ADD LESION IMG BX SPEC US GUIDE  Addendum Date: 09/26/2021   ADDENDUM REPORT: 09/26/2021 07:47 ADDENDUM: PATHOLOGY revealed: Site A. BREAST, 4:00, LEFT, BIOPSY: - Invasive mammary carcinoma. COMMENT: the biopsy material shows an infiltrative proliferation of arranged linearly and in small clusters. Based on the biopsy, the carcinoma appears  Nottingham grade 2 of 3 and measures 1.2 cm in greatest linear extent. Pathology results are CONCORDANT with imaging findings, per Dr. Ammie Ferrier. PATHOLOGY revealed: Site B. BREAST, 2:00, LEFT, BIOPSY: - Invasive ductal carcinoma. COMMENT: Based on the biopsy, the carcinoma appears Nottingham grade 2 of 3 and measures 0.8 cm in greatest linear extent. Pathology results are CONCORDANT with imaging findings, per Dr. Ammie Ferrier. PATHOLOGY revealed: Site C. LYMPH NODE, AXILLA, LOW, LEFT, BIOPSY: - Metastatic carcinoma involving nodal tissue. Pathology results are CONCORDANT with imaging findings, per Dr. Ammie Ferrier. Pathology results and recommendations below were discussed with patient by telephone on 09/25/2021. Patient reported biopsy site within normal limits with slight tenderness at the site. Post biopsy care instructions were reviewed, questions were answered and my direct phone number was provided to patient. Patient was instructed to call [facility] Mammography Department if any concerns or questions arise related to the biopsy. RECOMMENDATIONS: 1. Surgical consultation. Request for surgical consultation was relayed to Kathi Der RT at [facility]  Mammography Department by Electa Sniff RN on 09/25/2021. 2. Patient is scheduled for a RIGHT breast stereotactic guided biopsy at [place] on 10/01/2021 for RIGHT breast calcifications. Pathology results reported by Electa Sniff RN on 09/25/2021. Electronically Signed   By: Ammie Ferrier M.D.   On: 09/26/2021 07:47   Result Date: 09/26/2021 CLINICAL DATA:  66 year old female presenting for ultrasound-guided biopsy of 2 masses in the left breast and a left axillary lymph node. EXAM: ULTRASOUND GUIDED LEFT BREAST CORE NEEDLE BIOPSY COMPARISON:  Previous exam(s). PROCEDURE: I met with the patient and we discussed the procedure of ultrasound-guided biopsy, including benefits and alternatives. We discussed the high likelihood of a successful procedure. We discussed the risks of the procedure, including infection, bleeding, tissue injury, clip migration, and inadequate sampling. Informed written consent was given. The usual time-out protocol was performed immediately prior to the procedure. #1 Lesion quadrant: Lower outer quadrant Using sterile technique and 1% Lidocaine as local anesthetic, under direct ultrasound visualization, a 14 gauge spring-loaded device was used to perform biopsy of a mass in the left breast at 4 o'clock, 1 cm from the nipple using an inferior approach. At the conclusion of the procedure a ribbon shaped tissue marker clip was deployed into the biopsy cavity. --------------------------------------------------------------------------- ----------------------------------------------------------------- #2 Lesion quadrant: Upper outer quadrant Using sterile technique and 1% Lidocaine as local anesthetic, under direct ultrasound visualization, a 14 gauge spring-loaded device was used to perform biopsy of a mass in the left breast at 2 o'clock, 7 cm from the nipple using a lateral approach. At the conclusion of the procedure a wing shaped tissue marker clip was deployed into the  biopsy cavity. --------------------------------------------------------------------------- ----------------------------------------------------------------- #3 Lesion quadrant: Left axilla Using sterile technique and 1% Lidocaine as local anesthetic, under direct ultrasound visualization, a 14 gauge spring-loaded device was used to perform biopsy of a left axillary lymph node using a inferior approach. At the conclusion of the procedure a HydroMARK tissue marker clip was deployed into the biopsy cavity. Follow up 2 view mammogram was performed and dictated separately. IMPRESSION: 1. Ultrasound guided biopsy of a left breast mass at 4 o'clock (ribbon clip). No apparent complications. 2. Ultrasound guided biopsy of a left breast mass at 2 o'clock (wing clip). No apparent complications. 3. Ultrasound guided biopsy of a left axillary lymph node (HydroMARK clip). No apparent complications. Electronically Signed: By: Ammie Ferrier M.D. On: 09/24/2021 09:18  MM RT BREAST  BX W LOC DEV 1ST LESION IMAGE BX SPEC STEREO GUIDE  Addendum Date: 10/05/2021   ADDENDUM REPORT: 10/05/2021 07:46 ADDENDUM: Pathology revealed FIBROADENOMATOID NODULE WITH CALCIFICATIONS, FIBROCYSTIC CHANGES INCLUDING APOCRINE METAPLASIA of the RIGHT breast, upper outer quadrant, (coil clip). This was found to be concordant by Dr. Franki Cabot. Pathology results were discussed with the patient by telephone. The patient reported doing well after the biopsy with minimal to no tenderness at the site. Post biopsy instructions and care were reviewed and questions were answered. The patient was encouraged to call [facility] for any additional concerns. My direct phone number was provided. The patient has a recent diagnosis of LEFT breast cancer and is scheduled to see Dr. Curlene Labrum at [facility] in [place], [place] on October 06, 2021. Pathology results reported by Terie Purser, RN on 10/02/2021.  Electronically Signed   By: Franki Cabot M.D.   On: 10/05/2021 07:46   Result Date: 10/05/2021 CLINICAL DATA:  Patient with indeterminate coarse heterogeneous calcifications in the RIGHT breast presents today for stereotactic biopsy. Recent diagnosis of LEFT breast cancer. EXAM: RIGHT BREAST STEREOTACTIC CORE NEEDLE BIOPSY COMPARISON:  Previous exams. FINDINGS: The patient and I discussed the procedure of stereotactic-guided biopsy including benefits and alternatives. We discussed the high likelihood of a successful procedure. We discussed the risks of the procedure including infection, bleeding, tissue injury, clip migration, and inadequate sampling. Informed written consent was given. The usual time out protocol was performed immediately prior to the procedure. Using sterile technique and 1% Lidocaine as local anesthetic, under stereotactic guidance, a 9 gauge vacuum assisted device was used to perform core needle biopsy of calcifications in the upper outer quadrant of the RIGHT breast using a superior approach. Specimen radiograph was performed showing calcifications. Specimens with calcifications are identified for pathology. Lesion quadrant: Upper outer quadrant At the conclusion of the procedure, coil shaped tissue marker clip was deployed into the biopsy cavity. Follow-up 2-view mammogram was performed and dictated separately. IMPRESSION: Stereotactic-guided biopsy of indeterminate coarse heterogeneous calcifications within the upper-outer quadrant of the RIGHT breast. No apparent complications. Electronically Signed: By: Franki Cabot M.D. 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 - She noticed lump has gotten bigger and deformed since September 2022.  She has itching over the skin of the breast. - Left breast 4:00 biopsy: Invasive mammary carcinoma, grade 2, ER 100%, PR 70%, Ki-67 25%, HER2 FISH positive - Left  breast 2:00 biopsy: Invasive ductal carcinoma, grade 2, ER 100%, PR 70%, Ki-67 25%, HER2 negative - Left axillary lymph node biopsy: IDC, ER 100%, PR 50%, HER2 negative, E-cadherin positive - Bilateral breast MRI on 10/09/2021: Multiple enhancing masses with intervening non-mass enhancement involving most of the LOQ and part of the UOQ.  Abnormal enhancement spans approximately 12.4 x 6.4 x 7 cm.  Diffuse skin thickening/edema.  Multiple left axillary lymph nodes, largest node measuring approximately 2.4 cm in the lower axilla.  Indeterminate non-mass enhancement inferior to the biopsy site in the subareolar right breast measuring 1.5 x 0.9 x 0.8 cm.    Social/family history: - She lives at home with her husband and daughter.  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  Current active smoker, half pack per day since age 44. - Mother had cervical cancer and bladder cancer.  Maternal grandmother had ovarian cancer.  Brother died at age  27 from acute leukemia.  Another brother died of liver cancer.  Maternal half aunt had breast cancer.   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 - We have also discussed MRI results in detail. - Recommend PET scan for staging work-up.  Also obtain 2D echocardiogram. - We will ask for E-cadherin testing on left breast LOQ biopsy. - Would recommend genetic testing. - Also recommend right breast lesion biopsy. - Physical examination today shows mild warmth with no erythema or ecchymosis.  Left breast is contracted.  There is a 12 to 15 cm mass involving most of the upper quadrant and lower outer quadrant.  Low axillary lymph node is palpable. - If there is no evidence of metastatic disease, we have discussed role of neoadjuvant chemotherapy followed by mastectomy and lymph node biopsy.  2.  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 -  Continue gabapentin 300 mg 3 times daily.    Derek Jack, MD 10/15/21 6:56 PM  [place] [phone]   I, Thana Ates, am acting as a scribe for Dr. Derek Jack.  I, Derek Jack MD, have reviewed the above documentation for accuracy and completeness, and I agree with the above.

## 2021-10-15 NOTE — Patient Instructions (Addendum)
[place] at [facility] Discharge Instructions  You were seen and examined today by Dr. Delton Coombes. Dr. Delton Coombes is a medical oncologist, meaning that he specializes in the treatment of cancer diagnoses. Dr. Delton Coombes discussed your past medical history, family history of cancers, and the events that led to you being here today.  You were referred to Dr. Delton Coombes due to your left-sided Invasive Mammary Carcinoma, a common type of breast cancer. According to the biopsy, there is lymph node involvement. However, it does appear that there are two types of breast cancer - one is HER2 positive, one is HER2 negative. There are 3 receptors that are tested on all breast cancers known as: Estrogen Receptor, Progesterone Receptor and HER2. Because it is HER2 positive, this is generally associated with a more aggressive type of cancer requiring treatment. Treatment is given here in the clinic and does require a Port-A-Cath placement. Dr. Constance Haw can place the Port-A-Cath.  Dr. Delton Coombes has recommended a repeat right biopsy. Dr. Delton Coombes has also recommended a PET scan. A PET scan is a specialized CT scan that illuminates where there is cancer present in your body, this will accurately identify the stage of the cancer.  Dr. Delton Coombes also discussed a referral to a genetic counselor due to your new diagnosis of breast cancer as well as your family history of cancer.  In preparation for treatment, you should also have an echocardiogram to establish a baseline prior to treatment.  The goal of treatment will be to shrink the cancer down prior to mastectomy.   Follow-up as scheduled.   Thank you for choosing [place] at [facility] to provide your oncology and hematology care.  To afford each patient quality time with our provider, please arrive at least 15 minutes before your scheduled appointment time.   If you have a lab appointment with the [facility] please come in thru the Main Entrance and check in at the main information desk.  You need to re-schedule your appointment should you arrive 10 or more minutes late.  We strive to give you quality time with our providers, and arriving late affects you and other patients whose appointments are after yours.  Also, if you no show three or more times for appointments you may be dismissed from the clinic at the providers discretion.     Again, thank you for choosing [facility].  Our hope is that these requests will decrease the amount of time that you wait before being seen by our physicians.       _____________________________________________________________  Should you have questions after your visit to [facility], please contact our office at [phone] and follow the prompts.  Our office hours are 8:00 a.m. and 4:30 p.m. Monday - Friday.  Please note that voicemails left after 4:00 p.m. may not be returned until the following business day.  We are closed weekends and major holidays.  You do have access to a nurse 24-7, just call the main number to the clinic [phone] and do not press any options, hold on the line and a nurse will answer the phone.    For prescription refill requests, have your pharmacy contact our office and allow 72 hours.    Due to Covid, you will need to wear a mask upon entering the hospital. If you do not have a mask, a mask will be given to you at the Main Entrance upon arrival. For doctor  visits, patients may have 1 support person age 14 or older with them. For treatment visits, patients can not have anyone with them due to social distancing guidelines and our immunocompromised population.

## 2021-10-15 NOTE — Progress Notes (Signed)
I met with the patient and her daughter during and following initial visit with Dr. Katragadda. I explained my role in the patient's care and provided my contact information. Patient and family encouraged to call with questions or concerns. °

## 2021-10-16 ENCOUNTER — Ambulatory Visit: Payer: Managed Care, Other (non HMO) | Admitting: Nurse Practitioner

## 2021-10-16 ENCOUNTER — Ambulatory Visit
Admission: RE | Admit: 2021-10-16 | Discharge: 2021-10-16 | Disposition: A | Discharge status: Home / Self Care | Payer: Managed Care, Other (non HMO) | Source: Ambulatory Visit | Attending: General Surgery | Admitting: General Surgery

## 2021-10-16 ENCOUNTER — Encounter: Payer: Self-pay | Admitting: Adult Health

## 2021-10-16 VITALS — BP 112/72 | HR 86 | Ht 60.0 in | Wt 172.0 lb

## 2021-10-16 DIAGNOSIS — I2510 Atherosclerotic heart disease of native coronary artery without angina pectoris: Secondary | ICD-10-CM

## 2021-10-16 DIAGNOSIS — I10 Essential (primary) hypertension: Secondary | ICD-10-CM | POA: Diagnosis not present

## 2021-10-16 DIAGNOSIS — E785 Hyperlipidemia, unspecified: Secondary | ICD-10-CM | POA: Diagnosis not present

## 2021-10-16 DIAGNOSIS — I5032 Chronic diastolic (congestive) heart failure: Secondary | ICD-10-CM | POA: Diagnosis not present

## 2021-10-16 DIAGNOSIS — Z01810 Encounter for preprocedural cardiovascular examination: Secondary | ICD-10-CM | POA: Diagnosis not present

## 2021-10-16 DIAGNOSIS — E781 Pure hyperglyceridemia: Secondary | ICD-10-CM

## 2021-10-16 DIAGNOSIS — Z720 Tobacco use: Secondary | ICD-10-CM

## 2021-10-16 DIAGNOSIS — N6341 Unspecified lump in right breast, subareolar: Secondary | ICD-10-CM

## 2021-10-16 HISTORY — PX: BREAST BIOPSY: SHX20

## 2021-10-16 LAB — CANCER ANTIGEN 15-3: CA 15-3: 130 U/mL — ABNORMAL HIGH (ref 0.0–25.0)

## 2021-10-16 LAB — CANCER ANTIGEN 27.29: CA 27.29: 143.7 U/mL — ABNORMAL HIGH (ref 0.0–38.6)

## 2021-10-16 IMAGING — MG MM BREAST LOCALIZATION CLIP
4 series · 4 of 12 positions shown · non-contrast
Comparison: Previous exam(s).

CLINICAL DATA: Post biopsy mammogram of the right breast for clip
placement.

EXAM:
3D DIAGNOSTIC RIGHT MAMMOGRAM POST MRI BIOPSY

[R CC synth-2D]
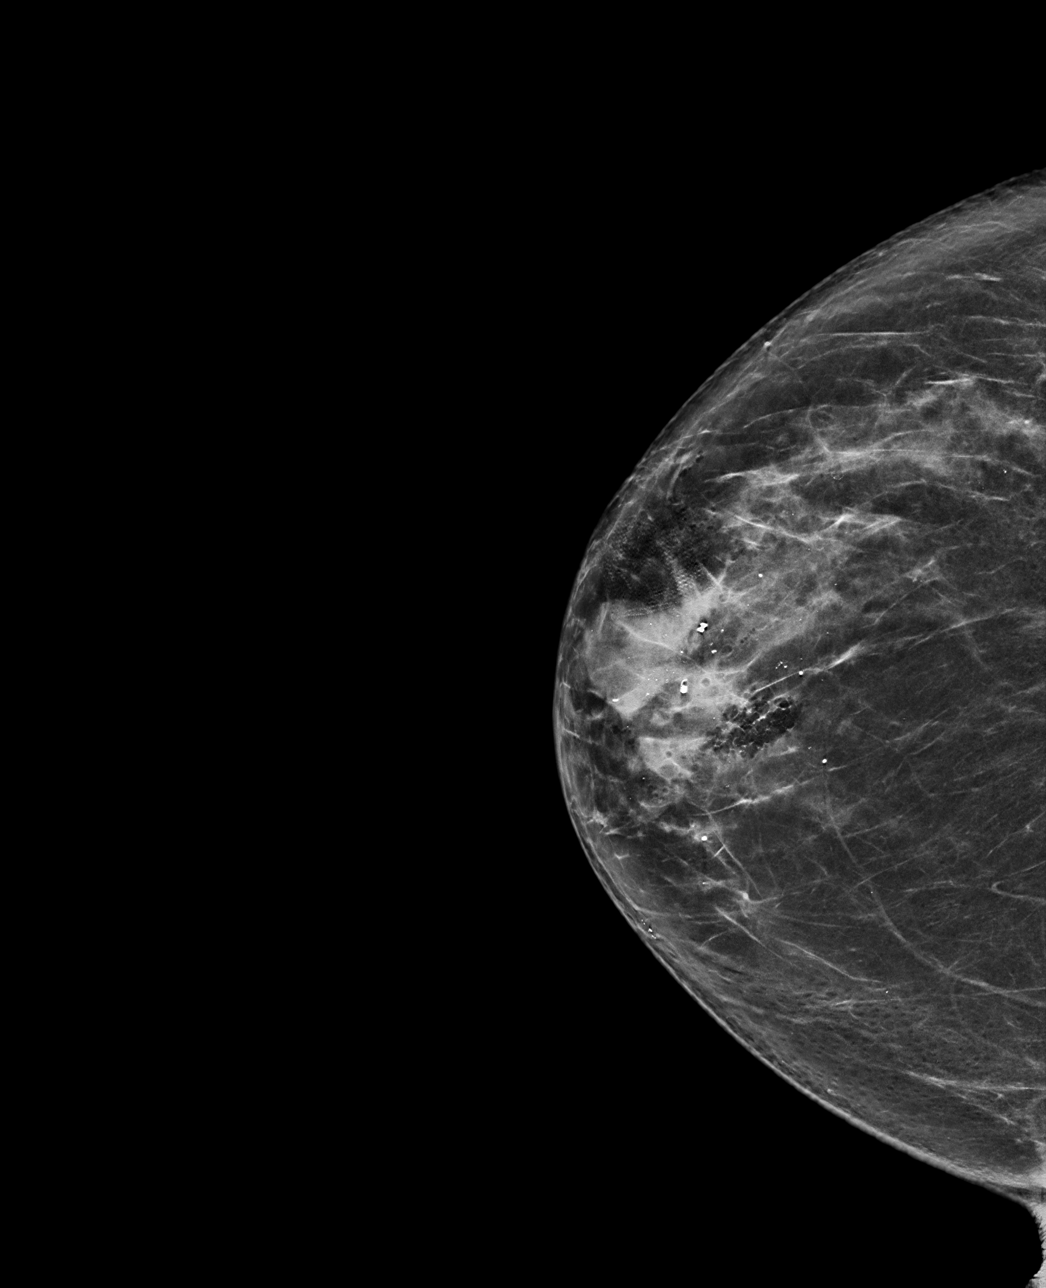

[R ML synth-2D]
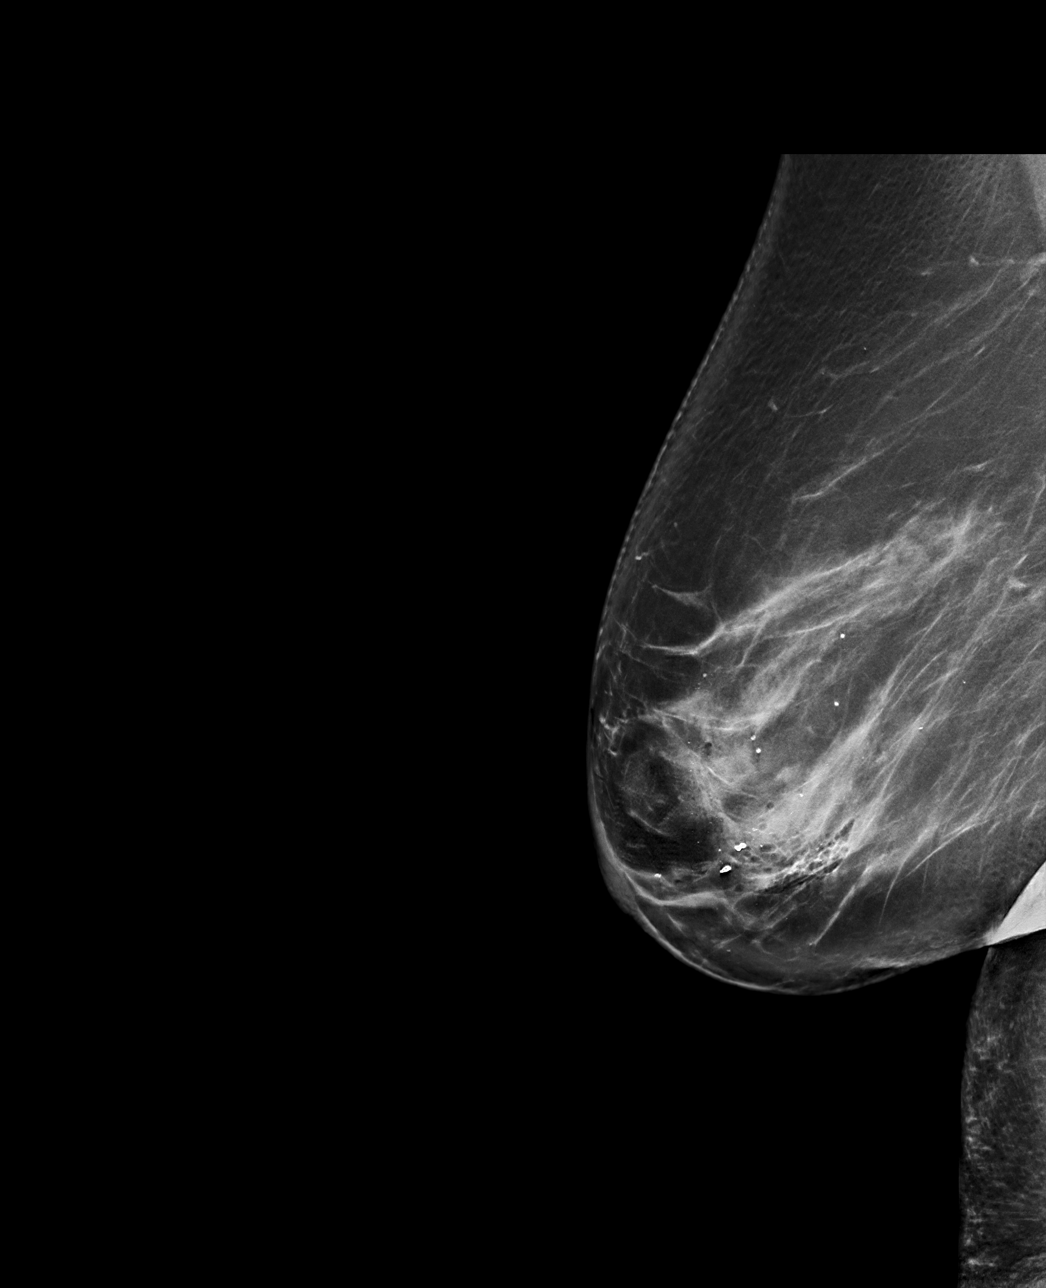

[R ML tomo · tomo slice 53/106.0]
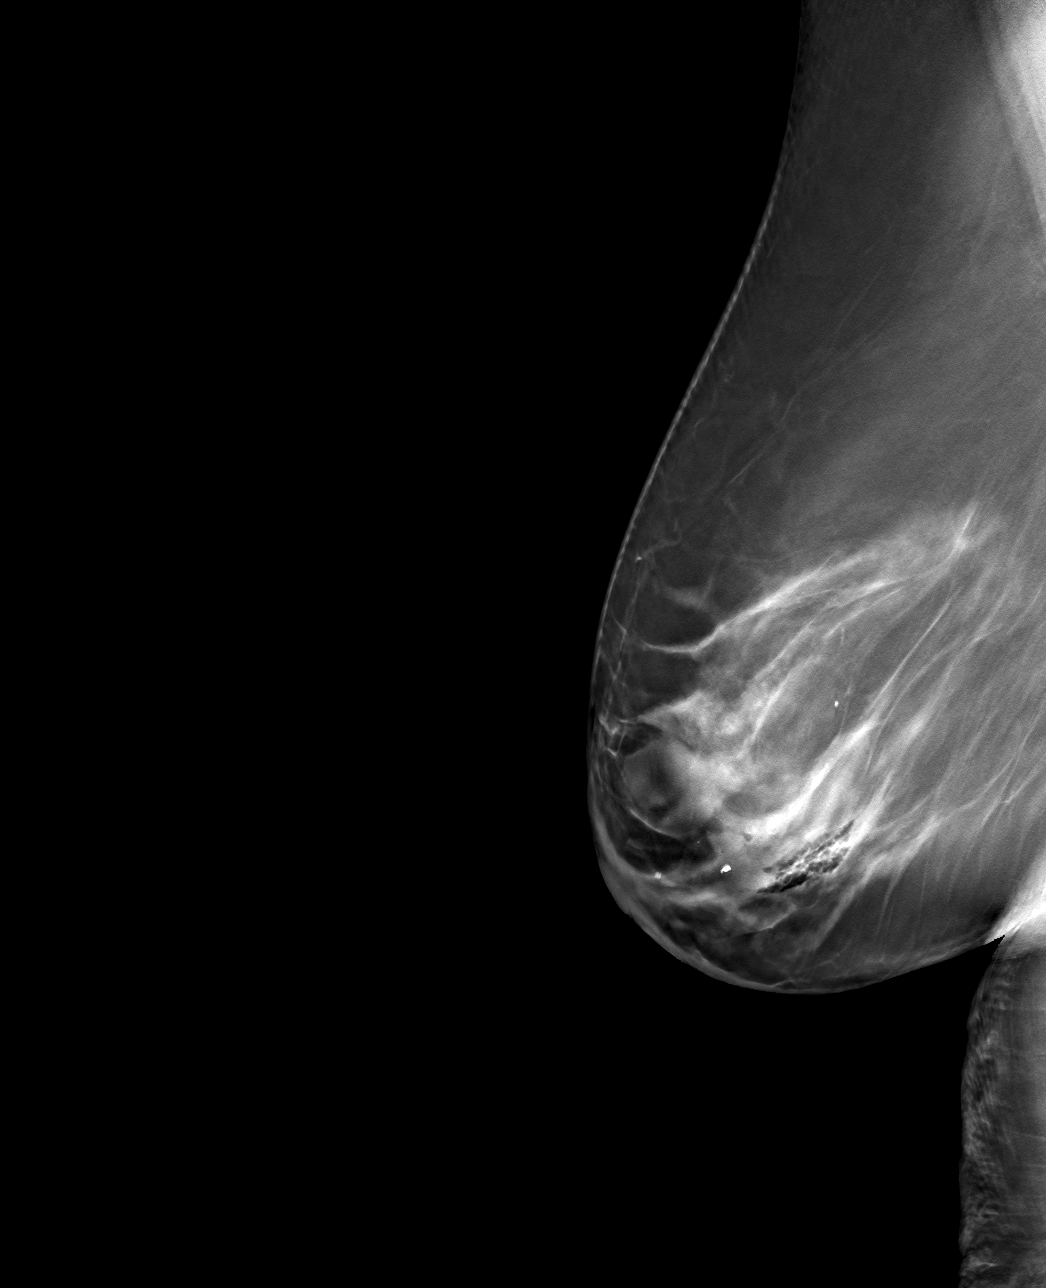

[R CC tomo · tomo slice 41/81.0]
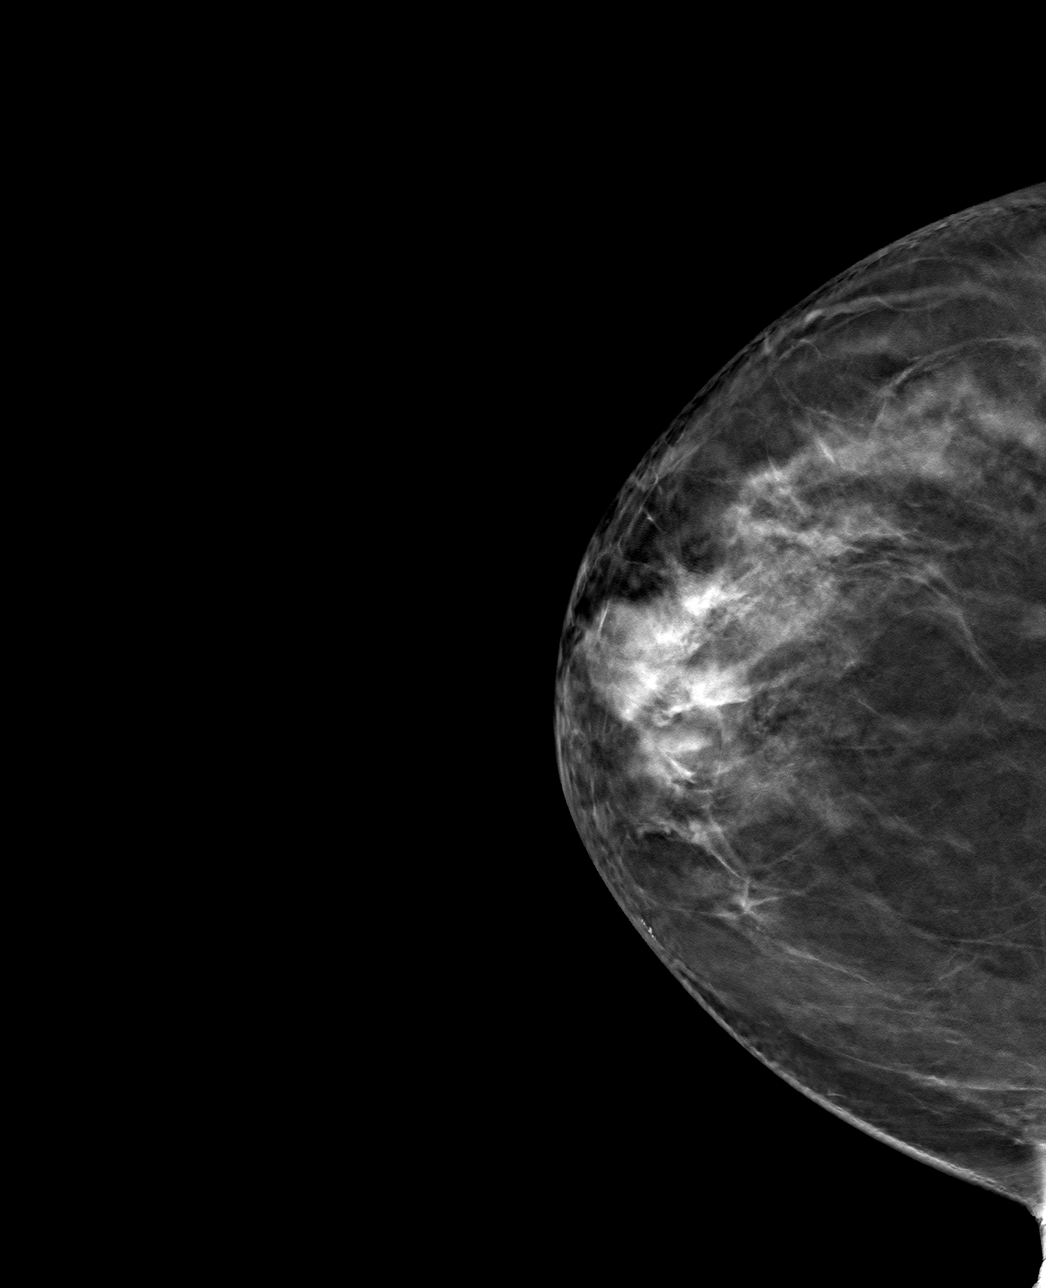

[4 of 12 positions shown; findings below may reference images not displayed]

FINDINGS: 3D Mammographic images were obtained following MRI guided biopsy of
non mass enhancement in the retroareolar right breast. The biopsy
marking clip is in expected position at the site of biopsy.
IMPRESSION: Appropriate positioning of the barbell shaped biopsy marking clip at
the site of biopsy in the retroareolar right breast.

Final Assessment: Post Procedure Mammograms for Marker Placement

## 2021-10-16 IMAGING — MR MR BREAST BX W/ LOC DEV 1ST LEASION IMAGE BX SPEC MR GUIDE*R*
7 of 10 series · 33 of 48 positions shown · IV contrast (8 ml gadavist)
Comparison: Previous exams.
COMPARISON: Previous exams.

Addendum:
CLINICAL DATA: 65-year-old female presenting for ultrasound-guided
biopsy of non mass enhancement in the retroareolar right breast.

EXAM:
MRI GUIDED CORE NEEDLE BIOPSY OF THE RIGHT BREAST
TECHNIQUE: Multiplanar, multisequence MR imaging of the breast was performed
both before and after administration of intravenous contrast.
CONTRAST:  8mL GADAVIST GADOBUTROL 1 MMOL/ML IV SOLN

[Series 2: fiducial unilateral · sagittal · 2.0mm · 1.33mm/px · 3 of 52 slices shown]
[im 1/52]
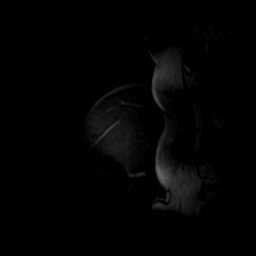
[im 26/52]
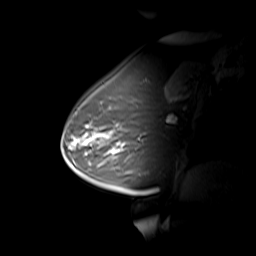
[im 52/52]
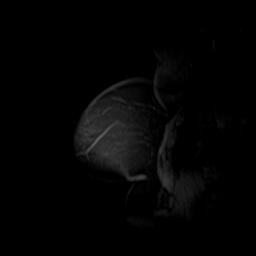

[Series 3: dynamic pre · axial · non-contrast · 1.3mm · 0.73mm/px · z∈[-56,+150]mm · 5 of 160 slices shown]
[im 1/160]
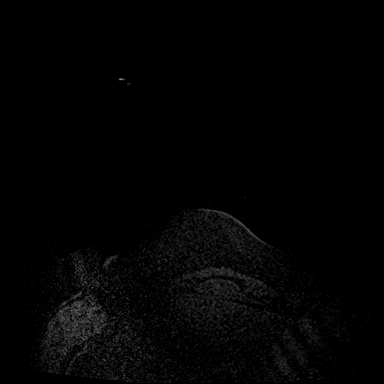
[im 40/160]
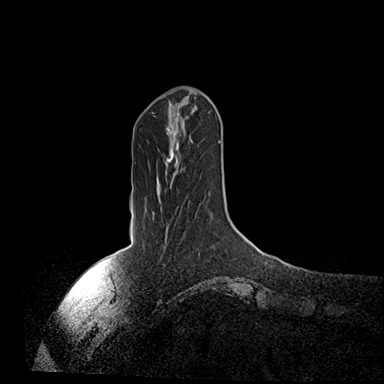
[im 80/160]
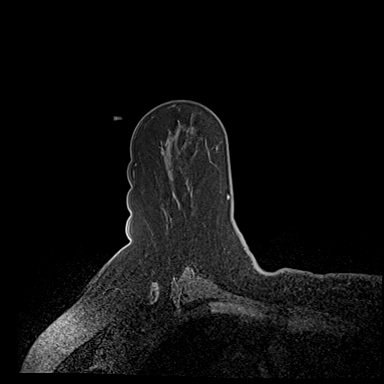
[im 120/160]
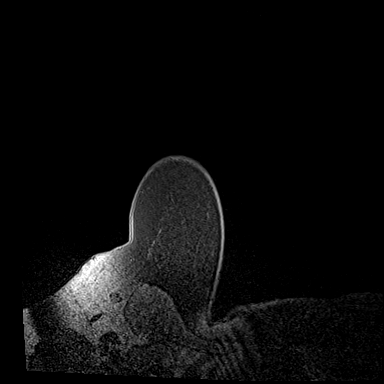
[im 160/160]
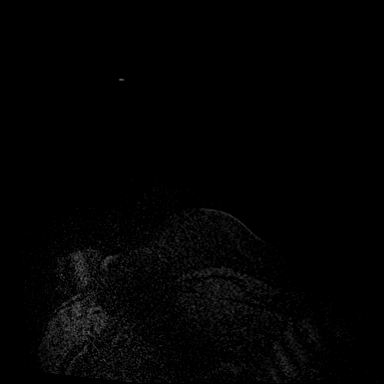

[Series 4: dynamic post 20 · axial · 1.3mm · 0.73mm/px · z∈[-56,+150]mm · 5 of 160 slices shown (1 of 2)]
[im 1/160]
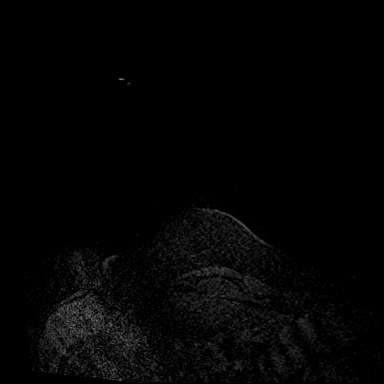
[im 40/160]
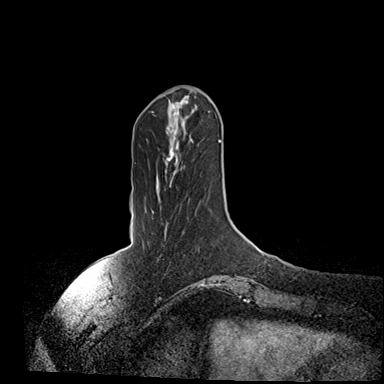
[im 80/160]
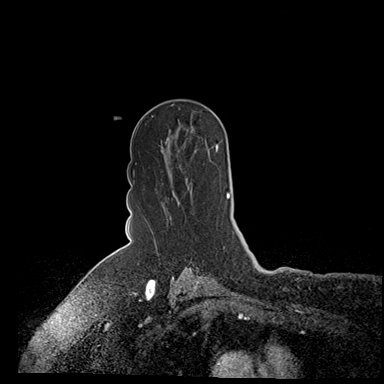
[im 120/160]
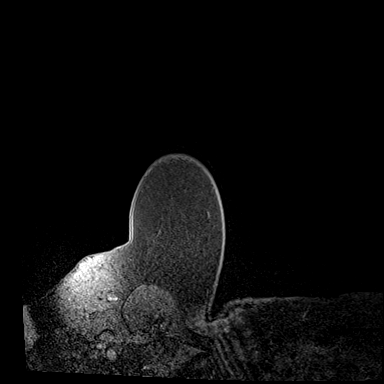
[im 160/160]
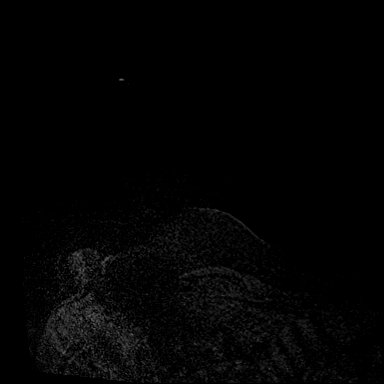

[Series 5: dynamic post 20 · axial · 1.3mm · 0.73mm/px · z∈[-56,+150]mm · 5 of 160 slices shown (2 of 2)]
[im 1/160]
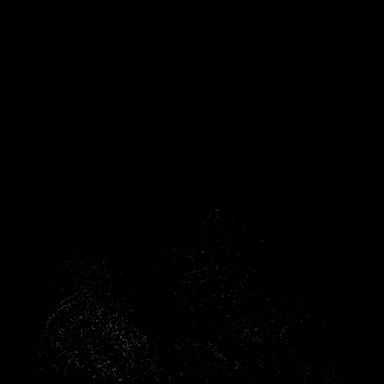
[im 40/160]
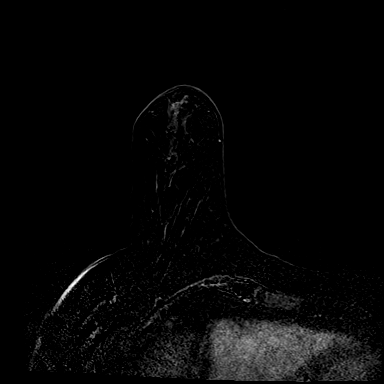
[im 80/160]
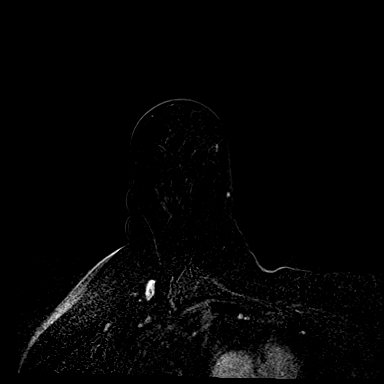
[im 120/160]
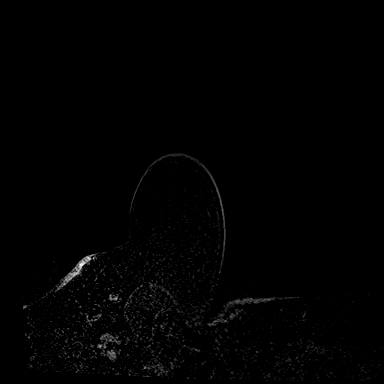
[im 160/160]
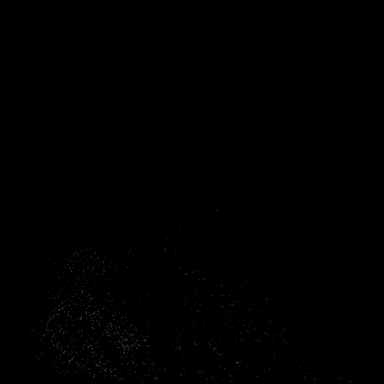

[Series 6: dynamic post 3 · axial · 1.3mm · 0.73mm/px · z∈[-56,+150]mm · 5 of 160 slices shown (1 of 2)]
[im 1/160]
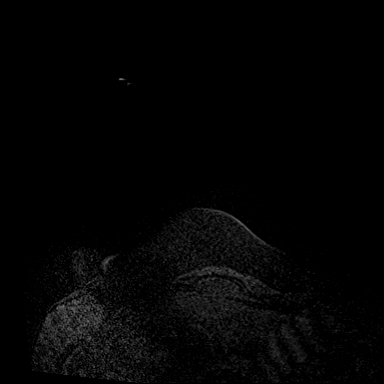
[im 40/160]
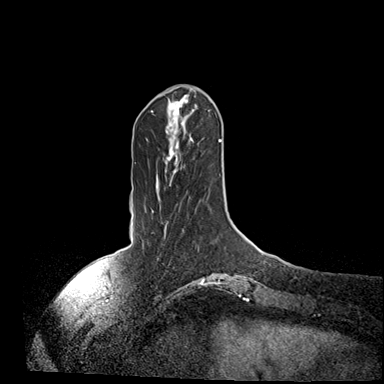
[im 80/160]
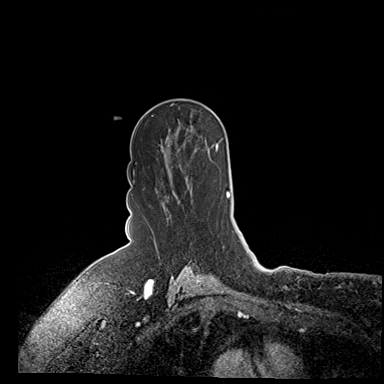
[im 120/160]
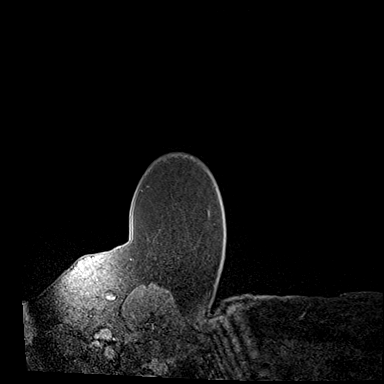
[im 160/160]
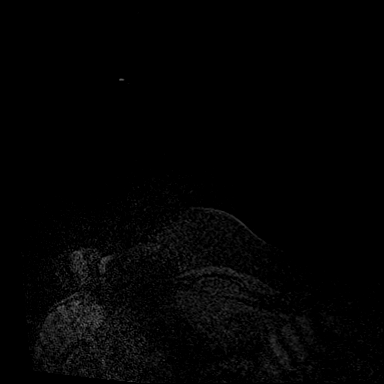

[Series 7: dynamic post 3 · axial · 1.3mm · 0.73mm/px · z∈[-56,+150]mm · 5 of 160 slices shown (2 of 2)]
[im 1/160]
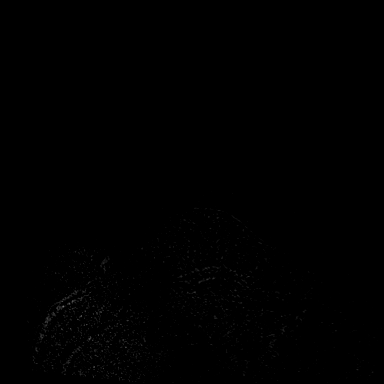
[im 40/160]
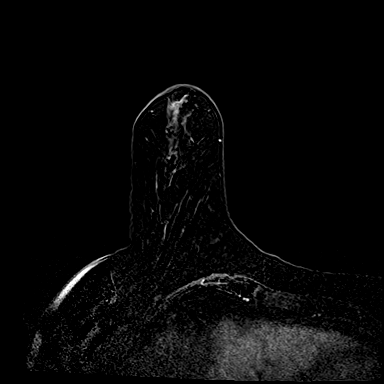
[im 80/160]
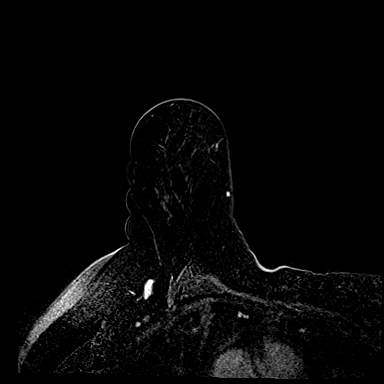
[im 120/160]
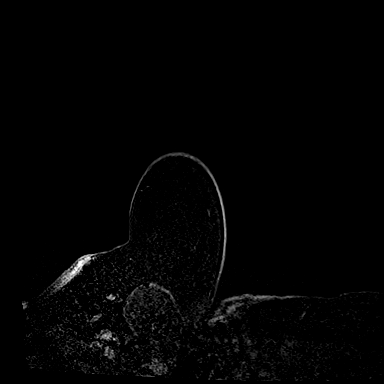
[im 160/160]
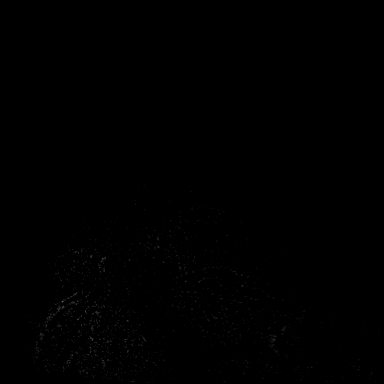

[Series 8: needle confirmation · axial · 1.3mm · 0.73mm/px · z∈[-56,+150]mm · 5 of 160 slices shown]
[im 1/160]
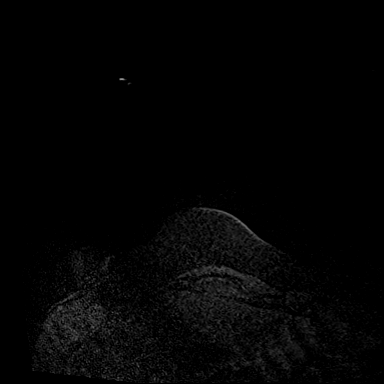
[im 40/160]
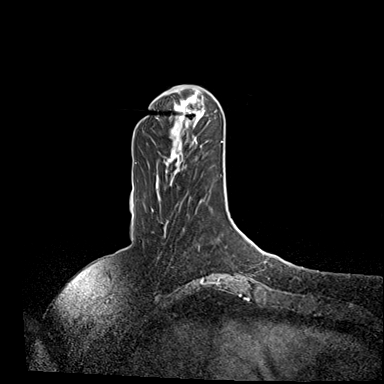
[im 80/160]
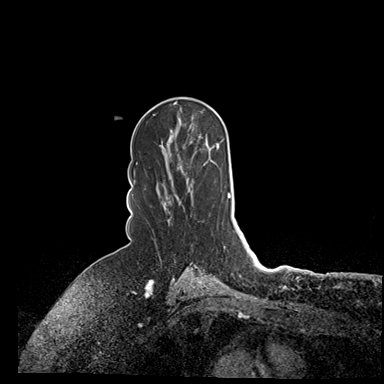
[im 120/160]
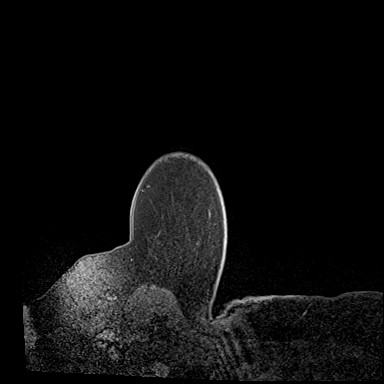
[im 160/160]
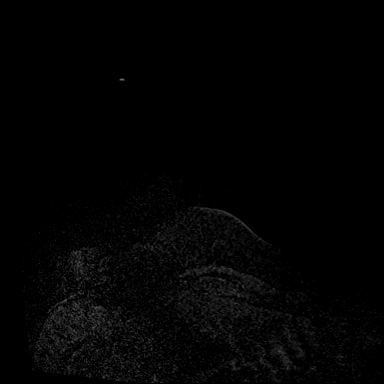

[33 of 48 positions shown; findings below may reference images not displayed]

FINDINGS: I met with the patient, and we discussed the procedure of MRI guided
biopsy, including risks, benefits, and alternatives. Specifically,
we discussed the risks of infection, bleeding, tissue injury, clip
migration, and inadequate sampling. Informed, written consent was
given. The usual time out protocol was performed immediately prior
to the procedure.

Using sterile technique, 1% Lidocaine, MRI guidance, and a 9 gauge
vacuum assisted device, biopsy was performed of non mass enhancement
in the retroareolar right breast using a lateral approach. At the
conclusion of the procedure, a barbell shaped tissue marker clip was
deployed into the biopsy cavity. Follow-up 2-view mammogram was
performed and dictated separately.
IMPRESSION: MRI guided biopsy of non mass enhancement in the retroareolar right
breast. No apparent complications.

ADDENDUM:
Pathology revealed LOBULAR NEOPLASIA (ATYPICAL LOBULAR HYPERPLASIA)-
COLUMNAR CELL AND FIBROCYSTIC CHANGES WITH CALCIFICATIONS- FOCAL
FEATURES CONSISTENT WITH PREVIOUS BIOPSY SITE- NO MALIGNANCY
IDENTIFIED of the RIGHT breast, retroareolar (barbell clip). This
was found to be concordant by Dr. OSNIEL, with excision
recommended.

Pathology results were discussed with the patient by telephone with
Dr. OSNIEL. The patient reported doing well after the
biopsy with tenderness at the site. Post biopsy instructions and
care were reviewed and questions were answered. The patient was
encouraged to call The [REDACTED] for any
additional concerns.

The patient has a recent diagnosis of LEFT breast cancer and should
follow her outlined treatment plan.

Pathology results reported by OSNIEL RN on [DATE].

*** End of Addendum ***
FINDINGS: I met with the patient, and we discussed the procedure of MRI guided
biopsy, including risks, benefits, and alternatives. Specifically,
we discussed the risks of infection, bleeding, tissue injury, clip
migration, and inadequate sampling. Informed, written consent was
given. The usual time out protocol was performed immediately prior
to the procedure.

Using sterile technique, 1% Lidocaine, MRI guidance, and a 9 gauge
vacuum assisted device, biopsy was performed of non mass enhancement
in the retroareolar right breast using a lateral approach. At the
conclusion of the procedure, a barbell shaped tissue marker clip was
deployed into the biopsy cavity. Follow-up 2-view mammogram was
performed and dictated separately.
IMPRESSION: MRI guided biopsy of non mass enhancement in the retroareolar right
breast. No apparent complications.

## 2021-10-16 MED ORDER — REPATHA SURECLICK 140 MG/ML ~~LOC~~ SOAJ: 140.0000 mg | SUBCUTANEOUS | 11 refills | Status: DC

## 2021-10-16 MED ORDER — GADOBUTROL 1 MMOL/ML IV SOLN
8.0000 mL | Freq: Once | INTRAVENOUS | Status: AC | PRN
  Administered 2021-10-16: 8 mL via INTRAVENOUS

## 2021-10-16 NOTE — Patient Instructions (Signed)
Medication Instructions:  No Changes *If you need a refill on your cardiac medications before your next appointment, please call your pharmacy*   Lab Work: No Labs If you have labs (blood work) drawn today and your tests are completely normal, you will receive your results only by: [place] (if you have MyChart) OR A paper copy in the mail If you have any lab test that is abnormal or we need to change your treatment, we will call you to review the results.   Testing/Procedures: No Testing    Follow-Up: At [facility], you and your health needs are our priority.  As part of our continuing mission to provide you with exceptional heart care, we have created designated Provider Care Teams.  These Care Teams include your primary Cardiologist (physician) and Advanced Practice Providers (APPs -  Physician Assistants and Nurse Practitioners) who all work together to provide you with the care you need, when you need it.  We recommend signing up for the patient portal called "MyChart".  Sign up information is provided on this After Visit Summary.  MyChart is used to connect with patients for Virtual Visits (Telemedicine).  Patients are able to view lab/test results, encounter notes, upcoming appointments, etc.  Non-urgent messages can be sent to your provider as well.   To learn more about what you can do with MyChart, go to [email or web address].    Your next appointment:   6 month(s)  The format for your next appointment:   In Person  Provider:   Quay Burow, MD     Other Instructions  Address for appointment 2/27 [address], [address].

## 2021-10-16 NOTE — Progress Notes (Signed)
Cardiology Office Note:    Date:  10/16/2021   ID:  Traci Fuller, DOB August 17, 1956, MRN [id number]  PCP:  Celene Squibb, MD   [facility] Providers Cardiologist:  Quay Burow, MD     Referring MD: Celene Squibb, MD   Chief complaint: preoperative cardiac evaluation  History of Present Illness:    Traci Fuller is a 66 y.o. female with a hx of CAD s/p PCI, s/p STEMI, HTN, hyperlipidemia, and recent diagnosis of breast cancer.  She had an anterior MI on 01/10/18 and underwent emergent cardiac catheterization which revealed occluded mid LAD for which she received DES x 2 in proximal and mid LAD. Had moderate disease in AV groove circumflex and mid RCA. Recommendation for DAPT x 12 months, consider PCI of RCA at a later time. Consider femoral approach for future intervention.  She was last seen in our office 03/03/18 by Dr. Gwenlyn Found. He ordered a myoview to check for physiologic significance of moderate disease in RCA and circumflex. There is no documentation of test completion and she has not been seen by our group since that time.   She is being seen today for clearance for port-a-cath insertion. Dr. Gwenlyn Found has advised that she may hold anti-platelet therapy if she is not experiencing any cardiac symptoms.  She denies chest pain, shortness of breath, palpitations, diaphoresis, edema, orthopnea, PND, weakness, dizziness, presyncope, or syncope. Tolerating DAPT without bleeding concerns. Monitor BP at home and reports it has been excellent. Quit smoking for a period of time after MI but started back at some point.  PCP has been monitoring cholesterol and she provides copies of these test results today. Triglycerides are >300, LDL is 120. States she is intolerant to statins and recently tried either Nexletol or Nexlizet and had same symptom as with statins, "locked up her thumbs." She was recently diagnosed with right breast cancer. She had breast biopsy today. Getting port-a-cath  inserted for chemotherapy.  According to the Revised Cardiac Risk Index (RCRI), her Perioperative Risk of Major Cardiac Event is (%): 0.9 Her Functional Capacity in METs is: 6.61 according to the [facility] Activity Status Index (DASI).   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 DES to midLAD, 75% stenosis of RCA not treated   Current smoker    Essential hypertension    Hyperlipidemia LDL goal <70     Past Surgical History:  Procedure Laterality Date   ABDOMINAL HYSTERECTOMY     BREAST BIOPSY Right 10/16/2021   CORONARY STENT INTERVENTION N/A 01/10/2018   Procedure: CORONARY STENT INTERVENTION;  Surgeon: Jettie Booze, MD;  Location: [facility] [facility];  Service: Cardiovascular;  Laterality: N/A;   LEFT HEART CATH AND CORONARY ANGIOGRAPHY N/A 01/10/2018   Procedure: LEFT HEART CATH AND CORONARY ANGIOGRAPHY;  Surgeon: Jettie Booze, MD;  Location: [place] [facility];  Service: Cardiovascular;  Laterality: N/A;    Current Medications: Current Meds  Medication Sig   ALPRAZolam (XANAX) 0.25 MG tablet Take 1 tablet (0.25 mg total) by mouth 2 (two) times daily as needed for anxiety.   amLODipine (NORVASC) 10 MG tablet Take 10 mg by mouth daily.   aspirin EC 81 MG EC tablet Take 1 tablet (81 mg total) by mouth daily.   cetirizine (ZYRTEC) 10 MG tablet Take 10 mg by mouth daily as needed.    co-enzyme Q-10 30 MG capsule Take 30 mg by mouth daily.  Evolocumab (REPATHA SURECLICK) 106 MG/ML SOAJ Inject 140 mg into the skin every 14 (fourteen) days.   gabapentin (NEURONTIN) 300 MG capsule Take 300 mg by mouth 2 (two) times daily.   metoprolol tartrate (LOPRESSOR) 25 MG tablet Take 1 tablet (25 mg total) by mouth 2 (two) times daily. Please call and schedule overdue appointment with Dr. Gwenlyn Found for further refills . SECOND ATTEMPT!   nitroGLYCERIN (NITROSTAT) 0.4 MG SL tablet Place 1 tablet (0.4 mg total) under the tongue every 5 (five)  minutes x 3 doses as needed for chest pain.   omeprazole (PRILOSEC) 20 MG capsule Take 20 mg by mouth daily.   ticagrelor (BRILINTA) 90 MG TABS tablet Take 1 tablet (90 mg total) by mouth 2 (two) times daily.     Allergies:   Patient has no known allergies.   Social History   Socioeconomic History   Marital status: Married    Spouse name: Not on file   Number of children: Not on file   Years of education: Not on file   Highest education level: Not on file  Occupational History   Not on file  Tobacco Use   Smoking status: Every Day    Packs/day: 5.00    Years: 43.00    Pack years: 215.00    Types: Cigarettes    Start date: 1976   Smokeless tobacco: Never  Vaping Use   Vaping Use: Never used  Substance and Sexual Activity   Alcohol use: Yes   Drug use: Never   Sexual activity: Not on file  Other Topics Concern   Not on file  Social History Narrative   Not on file   Social Determinants of Health   Financial Resource Strain: Not on file  Food Insecurity: Not on file  Transportation Needs: Not on file  Physical Activity: Not on file  Stress: Not on file  Social Connections: Not on file     Family History: The patient's family history includes Hypertension in her father.  ROS:   Please see the history of present illness.  All other systems reviewed and are negative.  Labs/Other Studies Reviewed:    The following studies were reviewed today:  Echo 5/19  Left ventricle:  The cavity size was normal. There was moderate  concentric hypertrophy. Systolic function was normal. The estimated  ejection fraction was in the range of 55% to 60%. Wall motion was  normal; there were no regional wall motion abnormalities. Doppler  parameters are consistent with abnormal left ventricular relaxation  (grade 1 diastolic dysfunction). Doppler parameters are consistent  with elevated ventricular end-diastolic filling pressure.  Aortic valve:   Trileaflet; normal thickness  leaflets. Mobility was  not restricted.  Doppler:  Transvalvular velocity was within the  normal range. There was no stenosis. There was no regurgitation.  VTI ratio of LVOT to aortic valve: 1.15. Valve area (VTI): 3.97  cm^2. Indexed valve area (VTI): 2.18 cm^2/m^2. Peak velocity ratio  of LVOT to aortic valve: 1.01. Valve area (Vmax): 3.49 cm^2.  Indexed valve area (Vmax): 1.92 cm^2/m^2. Mean velocity ratio of  LVOT to aortic valve: 1.05. Valve area (Vmean): 3.62 cm^2. Indexed  valve area (Vmean): 1.99 cm^2/m^2.    Mean gradient (S): 3 mm Hg.  Peak gradient (S): 5 mm Hg.  Aorta: Aortic root: The aortic root was normal in size.  Mitral valve:   Structurally normal valve.   Mobility was not  restricted.  Doppler:  Transvalvular velocity was within the normal  range.  There was no evidence for stenosis. There was mild  regurgitation.  Left atrium:  The atrium was normal in size.  Right ventricle:  The cavity size was normal. Wall thickness was  normal. Systolic function was normal.  Ventricular septum:   Septal motion showed paradox.  Pulmonic valve:    Structurally normal valve.   Cusp separation was  normal.  Doppler:  Transvalvular velocity was within the normal  range. There was no evidence for stenosis. There was no  regurgitation.  Tricuspid valve:   Structurally normal valve.    Doppler:  Transvalvular velocity was within the normal range. There was no  regurgitation.  Pulmonary artery:   The main pulmonary artery was normal-sized.  Systolic pressure could not be accurately estimated.  Right atrium:  The atrium was normal in size.  Pericardium: There was no pericardial effusion.  Systemic veins:  Inferior vena cava: The vessel was normal in size.    LHC 5/19  Mid RCA lesion is 75% stenosed. Prox LAD lesion is 50% stenosed. Mid LAD-1 lesion is 100% stenosed. A drug-eluting stent was successfully placed using a STENT SYNERGY DES 3X24. IVUS showed proximal edge  dissection. A drug-eluting stent was successfully placed using a STENT SYNERGY DES 3.5X16 to cover teh proximal edge of the above stent. Post intervention, there is a 0% residual stenosis. Mid Cx lesion is 60-70% stenosed. LV end diastolic pressure is moderately elevated. There is no aortic valve stenosis. Significant tortuosity in the right subclavian made catheter torquing difficult for the RCA.   Significant tortuosity in the right subclavian made catheter torquing difficult for the RCA.   Recommend uninterrupted dual antiplatelet therapy with Aspirin 81mg  QD and Ticagrelor 90mg  BID for a minimum of 12 months (ACS - Class I recommendation).  Consider PCI of the RCA at a later time.  Elevated LVEDP.  She will likely need to recover from this anterior MI for a few weeks.  Consider femoral approach for the RCA PCI.       Recent Labs: 10/15/2021: ALT 26; BUN 24; Creatinine, Ser 1.25; Hemoglobin 14.5; Platelets 337; Potassium 4.4; Sodium 135   From PCP 08/26/21 Total cholesterol 213, Trigs 328, HDL 35, LDL 120  Risk Assessment/Calculations:      Physical Exam:    VS:  BP 112/72 (BP Location: Left Arm, Patient Position: Sitting, Cuff Size: Large)    Pulse 86    Ht 5' (1.524 m)    Wt 172 lb (78 kg)    BMI 33.59 kg/m     Wt Readings from Last 3 Encounters:  10/16/21 172 lb (78 kg)  10/15/21 171 lb (77.6 kg)  10/06/21 173 lb (78.5 kg)     GEN:  Well nourished, well developed in no acute distress HEENT: Normal NECK: No JVD; No carotid bruits CARDIAC: RRR, no murmurs, rubs, gallops RESPIRATORY:  Clear to auscultation without rales, wheezing or rhonchi  ABDOMEN: Soft, non-tender, non-distended MUSCULOSKELETAL:  No edema; No deformity. 2+ pedal pulses, equal bilaterally SKIN: Warm and dry NEUROLOGIC:  Alert and oriented x 3 PSYCHIATRIC:  Normal affect   EKG:  EKG is ordered today.  The ekg ordered today demonstrates NSR  @ 86 bpm, TWI anterior leads  Diagnoses:    1. Chronic heart  failure with preserved ejection fraction (HFpEF) ([place])   2. Essential hypertension   3. Hyperlipidemia LDL goal <70   4. Tobacco abuse   5. Preop cardiovascular exam   6. Coronary artery disease involving native coronary artery of  native heart without angina pectoris   7. Hypertriglyceridemia    Assessment and Plan:     Preoperative cardiac evaluation: She can achieve greater than 4 METS activity without difficulty.  Risk for MACE is 0.9%.  She is scheduled for echocardiogram on 10/19/2021 per request of oncologist for which we concur with this plan. She may proceed without additional cardiac testing.  She may hold Brilinta 5 days. Would recommend that she continue aspirin therapy throughout the perioperative period, however she may hold at the discretion of the surgeon and resume as soon as hemodynamically stable postop.   CAD without angina: There was a question of physiologic significance of disease in the RCA at the time of catheterization in 2019. Reports she has had no chest pain, dyspnea on exertion, or other symptoms concerning for angina. Has been active working for a catering service and no change in activity tolerance. Still on DAPT with Brilinta and aspirin without bleeding concerns.  Continue DAPT, metoprolol.  Need to initiate statin therapy as noted below.   Hypertriglyceridemia/Hyperlipidemia LDL goal < 70: LDL 120, triglycerides 328, HDL 35 on 08/26/2021. Intolerant to statins and bempedoic acid. We will try Repatha 140 mg every 2 weeks. I told her she can wait until after her cath insertion to start. Will recheck lipid in 2-3 months.   Essential hypertension: BP is well controlled.  Home blood pressure readings well controlled also. Continue metoprolol, amlodipine.  Chronic HFpEF: LVEF 55-60%, G1 DD by echo 12/2017. No dyspnea, edema, orthopnea, PND. Appears euvolemic on exam today. Encouraged low sodium diet (<1800 mg daily). Is scheduled for echo on 10/19/21 in the setting of  upcoming surgery.   Tobacco abuse: She continues to smoke after a brief hiatus. Offered assistance to help her quit which she politely declined.  She thinks going through chemotherapy will cause her to quit.  Complete cessation advised.   Disposition: 6 months with Dr. Gwenlyn Found     Medication Adjustments/Labs and Tests Ordered: Current medicines are reviewed at length with the patient today.  Concerns regarding medicines are outlined above.  Orders Placed This Encounter  Procedures   EKG 12-Lead   Meds ordered this encounter  Medications   Evolocumab (REPATHA SURECLICK) 073 MG/ML SOAJ    Sig: Inject 140 mg into the skin every 14 (fourteen) days.    Dispense:  2 mL    Refill:  11    Order Specific Question:   Supervising Provider    Answer:   Thayer Headings [phone]    Patient Instructions  Medication Instructions:  No Changes *If you need a refill on your cardiac medications before your next appointment, please call your pharmacy*   Lab Work: No Labs If you have labs (blood work) drawn today and your tests are completely normal, you will receive your results only by: [place] (if you have MyChart) OR A paper copy in the mail If you have any lab test that is abnormal or we need to change your treatment, we will call you to review the results.   Testing/Procedures: No Testing    Follow-Up: At [facility], you and your health needs are our priority.  As part of our continuing mission to provide you with exceptional heart care, we have created designated Provider Care Teams.  These Care Teams include your primary Cardiologist (physician) and Advanced Practice Providers (APPs -  Physician Assistants and Nurse Practitioners) who all work together to provide you with the care you need, when you need it.  We recommend signing up for the patient portal called "MyChart".  Sign up information is provided on this After Visit Summary.  MyChart is used to connect with patients  for Virtual Visits (Telemedicine).  Patients are able to view lab/test results, encounter notes, upcoming appointments, etc.  Non-urgent messages can be sent to your provider as well.   To learn more about what you can do with MyChart, go to [email or web address].    Your next appointment:   6 month(s)  The format for your next appointment:   In Person  Provider:   Quay Burow, MD     Other Instructions  Address for appointment 2/27 [address].    Signed, Emmaline Life, NP  10/16/2021 2:24 PM    [place]

## 2021-10-19 ENCOUNTER — Ambulatory Visit (HOSPITAL_COMMUNITY): Payer: Managed Care, Other (non HMO) | Attending: Cardiology

## 2021-10-19 ENCOUNTER — Other Ambulatory Visit: Payer: Self-pay

## 2021-10-19 DIAGNOSIS — C50919 Malignant neoplasm of unspecified site of unspecified female breast: Secondary | ICD-10-CM | POA: Diagnosis not present

## 2021-10-19 DIAGNOSIS — I340 Nonrheumatic mitral (valve) insufficiency: Secondary | ICD-10-CM

## 2021-10-19 LAB — ECHOCARDIOGRAM COMPLETE
Area-P 1/2: 3.87 cm2
S' Lateral: 2.7 cm

## 2021-10-19 LAB — SURGICAL PATHOLOGY

## 2021-10-20 ENCOUNTER — Ambulatory Visit: Payer: Managed Care, Other (non HMO) | Admitting: General Surgery

## 2021-10-20 ENCOUNTER — Telehealth (INDEPENDENT_AMBULATORY_CARE_PROVIDER_SITE_OTHER): Payer: Managed Care, Other (non HMO) | Admitting: General Surgery

## 2021-10-20 ENCOUNTER — Telehealth: Payer: Self-pay | Admitting: Cardiovascular Disease

## 2021-10-20 DIAGNOSIS — N6341 Unspecified lump in right breast, subareolar: Secondary | ICD-10-CM

## 2021-10-20 DIAGNOSIS — C773 Secondary and unspecified malignant neoplasm of axilla and upper limb lymph nodes: Secondary | ICD-10-CM

## 2021-10-20 DIAGNOSIS — C50912 Malignant neoplasm of unspecified site of left female breast: Secondary | ICD-10-CM

## 2021-10-20 NOTE — Patient Instructions (Signed)
Traci Fuller  10/20/2021     @PREFPERIOPPHARMACY @   Your procedure is scheduled on  10/26/2021.   Report to [facility] at  0600 A.M.   Call this number if you have problems the morning of surgery:  [phone]   Remember:  Do not eat or drink after midnight.      Take these medicines the morning of surgery with A SIP OF WATER     xanax(if needed), amlodipine, gabapentin, metoprolol, prilosec.     Do not wear jewelry, make-up or nail polish.  Do not wear lotions, powders, or perfumes, or deodorant.  Do not shave 48 hours prior to surgery.  Men may shave face and neck.  Do not bring valuables to the hospital.  [facility] is not responsible for any belongings or valuables.  Contacts, dentures or bridgework may not be worn into surgery.  Leave your suitcase in the car.  After surgery it may be brought to your room.  For patients admitted to the hospital, discharge time will be determined by your treatment team.  Patients discharged the day of surgery will not be allowed to drive home and must have someone with them for 24 hours.    Special instructions:   DO NOT smoke tobacco or vape for 24 hours before your procedure.  Please read over the following fact sheets that you were given. Coughing and Deep Breathing, Surgical Site Infection Prevention, Anesthesia Post-op Instructions, and Care and Recovery After Surgery      Implanted [facility] Guide An implanted port is a device that is placed under the skin. It is usually placed in the chest. The device may vary based on the need. Implanted ports can be used to give IV medicine, to take blood, or to give fluids. You may have an implanted port if: You need IV medicine that would be irritating to the small veins in your hands or arms. You need IV medicines, such as chemotherapy, for a long period of time. You need IV nutrition for a long period of time. You may have fewer limitations when using a port  than you would if you used other types of long-term IVs. You will also likely be able to return to normal activities after your incision heals. An implanted port has two main parts: Reservoir. The reservoir is the part where a needle is inserted to give medicines or draw blood. The reservoir is round. After the port is placed, it appears as a small, raised area under your skin. Catheter. The catheter is a small, thin tube that connects the reservoir to a vein. Medicine that is inserted into the reservoir goes into the catheter and then into the vein. How is my port accessed? To access your port: A numbing cream may be placed on the skin over the port site. Your health care provider will put on a mask and sterile gloves. The skin over your port will be cleaned carefully with a germ-killing soap and allowed to dry. Your health care provider will gently pinch the port and insert a needle into it. Your health care provider will check for a blood return to make sure the port is in the vein and is still working (patent). If your port needs to remain accessed to get medicine continuously (constant infusion), your health care provider will place a clear bandage (dressing) over the needle site. The dressing and needle will need to be changed every  week, or as told by your health care provider. What is flushing? Flushing helps keep the port working. Follow instructions from your health care provider about how and when to flush the port. Ports are usually flushed with saline solution or a medicine called heparin. The need for flushing will depend on how the port is used: If the port is only used from time to time to give medicines or draw blood, the port may need to be flushed: Before and after medicines have been given. Before and after blood has been drawn. As part of routine maintenance. Flushing may be recommended every 4-6 weeks. If a constant infusion is running, the port may not need to be  flushed. Throw away any syringes in a disposal container that is meant for sharp items (sharps container). You can buy a sharps container from a pharmacy, or you can make one by using an empty hard plastic bottle with a cover. How long will my port stay implanted? The port can stay in for as long as your health care provider thinks it is needed. When it is time for the port to come out, a surgery will be done to remove it. The surgery will be similar to the procedure that was done to put the port in. Follow these instructions at home: Caring for your port and port site Flush your port as told by your health care provider. If you need an infusion over several days, follow instructions from your health care provider about how to take care of your port site. Make sure you: Change your dressing as told by your health care provider. Wash your hands with soap and water for at least 20 seconds before and after you change your dressing. If soap and water are not available, use alcohol-based hand sanitizer. Place any used dressings or infusion bags into a plastic bag. Throw that bag in the trash. Keep the dressing that covers the needle clean and dry. Do not get it wet. Do not use scissors or sharp objects near the infusion tubing. Keep any external tubes clamped, unless they are being used. Check your port site every day for signs of infection. Check for: Redness, swelling, or pain. Fluid or blood. Warmth. Pus or a bad smell. Protect the skin around the port site. Avoid wearing bra straps that rub or irritate the site. Protect the skin around your port from seat belts. Place a soft pad over your chest if needed. Bathe or shower as told by your health care provider. The site may get wet as long as you are not actively receiving an infusion. General instructions  Return to your normal activities as told by your health care provider. Ask your health care provider what activities are safe for  you. Carry a medical alert card or wear a medical alert bracelet at all times. This will let health care providers know that you have an implanted port in case of an emergency. Where to find more information American [employer] Society: www.cancer.[place] [employer]: www.cancer.net Contact a health care provider if: You have a fever or chills. You have redness, swelling, or pain at the port site. You have fluid or blood coming from your port site. Your incision feels warm to the touch. You have pus or a bad smell coming from the port site. Summary Implanted ports are usually placed in the chest for long-term IV access. Follow instructions from your health care provider about flushing the port and changing bandages (dressings). Take  care of the area around your port by avoiding clothing that puts pressure on the area, and by watching for signs of infection. Protect the skin around your port from seat belts. Place a soft pad over your chest if needed. Contact a health care provider if you have a fever or you have redness, swelling, pain, fluid, or a bad smell at the port site. This information is not intended to replace advice given to you by your health care provider. Make sure you discuss any questions you have with your health care provider. Document Revised: 02/10/2021 Document Reviewed: 02/10/2021 Elsevier Patient Education  2022 [place] Insertion, Care After The following information offers guidance on how to care for yourself after your procedure. Your health care provider may also give you more specific instructions. If you have problems or questions, contact your health care provider. What can I expect after the procedure? After the procedure, it is common to have: Discomfort at the port insertion site. Bruising on the skin over the port. This should improve over 3-4 days. Follow these instructions at home: [facility] care After your port is  placed, you will get a manufacturer's information card. The card has information about your port. Keep this card with you at all times. Take care of the port as told by your health care provider. Ask your health care provider if you or a family member can get training for taking care of the port at home. A home health care nurse will be be available to help care for the port. Make sure to remember what type of port you have. Incision care   Follow instructions from your health care provider about how to take care of your port insertion site. Make sure you: Wash your hands with soap and water for at least 20 seconds before and after you change your bandage (dressing). If soap and water are not available, use hand sanitizer. Change your dressing as told by your health care provider. Leave stitches (sutures), skin glue, or adhesive strips in place. These skin closures may need to stay in place for 2 weeks or longer. If adhesive strip edges start to loosen and curl up, you may trim the loose edges. Do not remove adhesive strips completely unless your health care provider tells you to do that. Check your port insertion site every day for signs of infection. Check for: Redness, swelling, or pain. Fluid or blood. Warmth. Pus or a bad smell. Activity Return to your normal activities as told by your health care provider. Ask your health care provider what activities are safe for you. You may have to avoid lifting. Ask your health care provider how much you can safely lift. General instructions Take over-the-counter and prescription medicines only as told by your health care provider. Do not take baths, swim, or use a hot tub until your health care provider approves. Ask your health care provider if you may take showers. You may only be allowed to take sponge baths. If you were given a sedative during the procedure, it can affect you for several hours. Do not drive or operate machinery until your health  care provider says that it is safe. Wear a medical alert bracelet in case of an emergency. This will tell any health care providers that you have a port. Keep all follow-up visits. This is important. Contact a health care provider if: You cannot flush your port with saline as directed, or you cannot draw blood from the port. You  have a fever or chills. You have redness, swelling, or pain around your port insertion site. You have fluid or blood coming from your port insertion site. Your port insertion site feels warm to the touch. You have pus or a bad smell coming from the port insertion site. Get help right away if: You have chest pain or shortness of breath. You have bleeding from your port that you cannot control. These symptoms may be an emergency. Get help right away. Call 911. Do not wait to see if the symptoms will go away. Do not drive yourself to the hospital. Summary Take care of the port as told by your health care provider. Keep the manufacturer's information card with you at all times. Change your dressing as told by your health care provider. Contact a health care provider if you have a fever or chills or if you have redness, swelling, or pain around your port insertion site. Keep all follow-up visits. This information is not intended to replace advice given to you by your health care provider. Make sure you discuss any questions you have with your health care provider. Document Revised: 02/10/2021 Document Reviewed: 02/10/2021 Elsevier Patient Education  [place] Anesthesia, Adult, Care After This sheet gives you information about how to care for yourself after your procedure. Your health care provider may also give you more specific instructions. If you have problems or questions, contact your health care provider. What can I expect after the procedure? After the procedure, the following side effects are common: Pain or discomfort at the IV  site. Nausea. Vomiting. Sore throat. Trouble concentrating. Feeling cold or chills. Feeling weak or tired. Sleepiness and fatigue. Soreness and body aches. These side effects can affect parts of the body that were not involved in surgery. Follow these instructions at home: For the time period you were told by your health care provider:  Rest. Do not participate in activities where you could fall or become injured. Do not drive or use machinery. Do not drink alcohol. Do not take sleeping pills or medicines that cause drowsiness. Do not make important decisions or sign legal documents. Do not take care of children on your own. Eating and drinking Follow any instructions from your health care provider about eating or drinking restrictions. When you feel hungry, start by eating small amounts of foods that are soft and easy to digest (bland), such as toast. Gradually return to your regular diet. Drink enough fluid to keep your urine pale yellow. If you vomit, rehydrate by drinking water, juice, or clear broth. General instructions If you have sleep apnea, surgery and certain medicines can increase your risk for breathing problems. Follow instructions from your health care provider about wearing your sleep device: Anytime you are sleeping, including during daytime naps. While taking prescription pain medicines, sleeping medicines, or medicines that make you drowsy. Have a responsible adult stay with you for the time you are told. It is important to have someone help care for you until you are awake and alert. Return to your normal activities as told by your health care provider. Ask your health care provider what activities are safe for you. Take over-the-counter and prescription medicines only as told by your health care provider. If you smoke, do not smoke without supervision. Keep all follow-up visits as told by your health care provider. This is important. Contact a health care  provider if: You have nausea or vomiting that does not get better with medicine. You cannot eat  or drink without vomiting. You have pain that does not get better with medicine. You are unable to pass urine. You develop a skin rash. You have a fever. You have redness around your IV site that gets worse. Get help right away if: You have difficulty breathing. You have chest pain. You have blood in your urine or stool, or you vomit blood. Summary After the procedure, it is common to have a sore throat or nausea. It is also common to feel tired. Have a responsible adult stay with you for the time you are told. It is important to have someone help care for you until you are awake and alert. When you feel hungry, start by eating small amounts of foods that are soft and easy to digest (bland), such as toast. Gradually return to your regular diet. Drink enough fluid to keep your urine pale yellow. Return to your normal activities as told by your health care provider. Ask your health care provider what activities are safe for you. This information is not intended to replace advice given to you by your health care provider. Make sure you discuss any questions you have with your health care provider. Document Revised: 04/24/2020 Document Reviewed: 11/22/2019 Elsevier Patient Education  2022 [place]. How to Use Chlorhexidine for Bathing Chlorhexidine gluconate (CHG) is a germ-killing (antiseptic) solution that is used to clean the skin. It can get rid of the bacteria that normally live on the skin and can keep them away for about 24 hours. To clean your skin with CHG, you may be given: A CHG solution to use in the shower or as part of a sponge bath. A prepackaged cloth that contains CHG. Cleaning your skin with CHG may help lower the risk for infection: While you are staying in the intensive care unit of the hospital. If you have a vascular access, such as a central line, to provide short-term or  long-term access to your veins. If you have a catheter to drain urine from your bladder. If you are on a ventilator. A ventilator is a machine that helps you breathe by moving air in and out of your lungs. After surgery. What are the risks? Risks of using CHG include: A skin reaction. Hearing loss, if CHG gets in your ears and you have a perforated eardrum. Eye injury, if CHG gets in your eyes and is not rinsed out. The CHG product catching fire. Make sure that you avoid smoking and flames after applying CHG to your skin. Do not use CHG: If you have a chlorhexidine allergy or have previously reacted to chlorhexidine. On babies younger than 15 months of age. How to use CHG solution Use CHG only as told by your health care provider, and follow the instructions on the label. Use the full amount of CHG as directed. Usually, this is one bottle. During a shower Follow these steps when using CHG solution during a shower (unless your health care provider gives you different instructions): Start the shower. Use your normal soap and shampoo to wash your face and hair. Turn off the shower or move out of the shower stream. Pour the CHG onto a clean washcloth. Do not use any type of brush or rough-edged sponge. Starting at your neck, lather your body down to your toes. Make sure you follow these instructions: If you will be having surgery, pay special attention to the part of your body where you will be having surgery. Scrub this area for at least 1 minute.  Do not use CHG on your head or face. If the solution gets into your ears or eyes, rinse them well with water. Avoid your genital area. Avoid any areas of skin that have broken skin, cuts, or scrapes. Scrub your back and under your arms. Make sure to wash skin folds. Let the lather sit on your skin for 1-2 minutes or as long as told by your health care provider. Thoroughly rinse your entire body in the shower. Make sure that all body creases and  crevices are rinsed well. Dry off with a clean towel. Do not put any substances on your body afterward--such as powder, lotion, or perfume--unless you are told to do so by your health care provider. Only use lotions that are recommended by the manufacturer. Put on clean clothes or pajamas. If it is the night before your surgery, sleep in clean sheets.  During a sponge bath Follow these steps when using CHG solution during a sponge bath (unless your health care provider gives you different instructions): Use your normal soap and shampoo to wash your face and hair. Pour the CHG onto a clean washcloth. Starting at your neck, lather your body down to your toes. Make sure you follow these instructions: If you will be having surgery, pay special attention to the part of your body where you will be having surgery. Scrub this area for at least 1 minute. Do not use CHG on your head or face. If the solution gets into your ears or eyes, rinse them well with water. Avoid your genital area. Avoid any areas of skin that have broken skin, cuts, or scrapes. Scrub your back and under your arms. Make sure to wash skin folds. Let the lather sit on your skin for 1-2 minutes or as long as told by your health care provider. Using a different clean, wet washcloth, thoroughly rinse your entire body. Make sure that all body creases and crevices are rinsed well. Dry off with a clean towel. Do not put any substances on your body afterward--such as powder, lotion, or perfume--unless you are told to do so by your health care provider. Only use lotions that are recommended by the manufacturer. Put on clean clothes or pajamas. If it is the night before your surgery, sleep in clean sheets. How to use CHG prepackaged cloths Only use CHG cloths as told by your health care provider, and follow the instructions on the label. Use the CHG cloth on clean, dry skin. Do not use the CHG cloth on your head or face unless your health  care provider tells you to. When washing with the CHG cloth: Avoid your genital area. Avoid any areas of skin that have broken skin, cuts, or scrapes. Before surgery Follow these steps when using a CHG cloth to clean before surgery (unless your health care provider gives you different instructions): Using the CHG cloth, vigorously scrub the part of your body where you will be having surgery. Scrub using a back-and-forth motion for 3 minutes. The area on your body should be completely wet with CHG when you are done scrubbing. Do not rinse. Discard the cloth and let the area air-dry. Do not put any substances on the area afterward, such as powder, lotion, or perfume. Put on clean clothes or pajamas. If it is the night before your surgery, sleep in clean sheets.  For general bathing Follow these steps when using CHG cloths for general bathing (unless your health care provider gives you different instructions). Use a separate  CHG cloth for each area of your body. Make sure you wash between any folds of skin and between your fingers and toes. Wash your body in the following order, switching to a new cloth after each step: The front of your neck, shoulders, and chest. Both of your arms, under your arms, and your hands. Your stomach and groin area, avoiding the genitals. Your right leg and foot. Your left leg and foot. The back of your neck, your back, and your buttocks. Do not rinse. Discard the cloth and let the area air-dry. Do not put any substances on your body afterward--such as powder, lotion, or perfume--unless you are told to do so by your health care provider. Only use lotions that are recommended by the manufacturer. Put on clean clothes or pajamas. Contact a health care provider if: Your skin gets irritated after scrubbing. You have questions about using your solution or cloth. You swallow any chlorhexidine. Call your local poison control center (1-4172941233 in the [place] Get help  right away if: Your eyes itch badly, or they become very red or swollen. Your skin itches badly and is red or swollen. Your hearing changes. You have trouble seeing. You have swelling or tingling in your mouth or throat. You have trouble breathing. These symptoms may represent a serious problem that is an emergency. Do not wait to see if the symptoms will go away. Get medical help right away. Call your local emergency services (911 in the [place] Do not drive yourself to the hospital. Summary Chlorhexidine gluconate (CHG) is a germ-killing (antiseptic) solution that is used to clean the skin. Cleaning your skin with CHG may help to lower your risk for infection. You may be given CHG to use for bathing. It may be in a bottle or in a prepackaged cloth to use on your skin. Carefully follow your health care provider's instructions and the instructions on the product label. Do not use CHG if you have a chlorhexidine allergy. Contact your health care provider if your skin gets irritated after scrubbing. This information is not intended to replace advice given to you by your health care provider. Make sure you discuss any questions you have with your health care provider. Document Revised: 10/20/2020 Document Reviewed: 10/20/2020 Elsevier Patient Education  2022 [employer].

## 2021-10-20 NOTE — Telephone Encounter (Signed)
Pt c/o medication issue:  1. Name of Medication: Evolocumab (REPATHA SURECLICK) 834 MG/ML SOAJ  2. How are you currently taking this medication (dosage and times per day)? Inject 140 mg into the skin every 14 (fourteen) days.  3. Are you having a reaction (difficulty breathing--STAT)? no  4. What is your medication issue? Calling to get prior authorization or the medication

## 2021-10-20 NOTE — Telephone Encounter (Signed)
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   Unless radiology feels differently I do not think she will likely need an excisional biopsy of the area given the MRI and two biopsies on the right .  Curlene Labrum, MD [facility] [address] [place], [place] [address] [phone] (office)

## 2021-10-20 NOTE — Telephone Encounter (Signed)
Called and spoke w/pharmacy rep and stated a pa was submitted for repatha.  [facility] (Key: Gretta Cool) - [id number] Repatha SureClick 140MG /ML auto-injectors Status: PA Request Created: February 24th, 2023 [phone] Sent: February 28th, 2023

## 2021-10-22 ENCOUNTER — Inpatient Hospital Stay: Payer: Managed Care, Other (non HMO) | Attending: Oncology | Admitting: Licensed Clinical Social Worker

## 2021-10-22 ENCOUNTER — Ambulatory Visit (HOSPITAL_COMMUNITY)
Admission: RE | Admit: 2021-10-22 | Discharge: 2021-10-22 | Disposition: A | Discharge status: Home / Self Care | Payer: Managed Care, Other (non HMO) | Source: Ambulatory Visit | Attending: Hematology | Admitting: Hematology

## 2021-10-22 ENCOUNTER — Encounter: Payer: Self-pay | Admitting: Licensed Clinical Social Worker

## 2021-10-22 ENCOUNTER — Other Ambulatory Visit: Payer: Self-pay

## 2021-10-22 DIAGNOSIS — Z8052 Family history of malignant neoplasm of bladder: Secondary | ICD-10-CM | POA: Diagnosis not present

## 2021-10-22 DIAGNOSIS — Z806 Family history of leukemia: Secondary | ICD-10-CM | POA: Insufficient documentation

## 2021-10-22 DIAGNOSIS — Z8049 Family history of malignant neoplasm of other genital organs: Secondary | ICD-10-CM

## 2021-10-22 DIAGNOSIS — Z170 Estrogen receptor positive status [ER+]: Secondary | ICD-10-CM

## 2021-10-22 DIAGNOSIS — C50919 Malignant neoplasm of unspecified site of unspecified female breast: Secondary | ICD-10-CM | POA: Insufficient documentation

## 2021-10-22 DIAGNOSIS — C50812 Malignant neoplasm of overlapping sites of left female breast: Secondary | ICD-10-CM

## 2021-10-22 DIAGNOSIS — Z803 Family history of malignant neoplasm of breast: Secondary | ICD-10-CM

## 2021-10-22 IMAGING — CT NM PET TUM IMG INITIAL (PI) SKULL BASE T - THIGH
7 of 8 series · 24 of 25 positions shown · non-contrast
Comparison: Breast MRI [DATE].

CLINICAL DATA: Initial treatment strategy for bilateral breast
cancer.

EXAM:
NUCLEAR MEDICINE PET SKULL BASE TO THIGH
TECHNIQUE: 9.08 mCi F-18 FDG was injected intravenously. Full-ring PET imaging
was performed from the skull base to thigh after the radiotracer. CT
data was obtained and used for attenuation correction and anatomic
localization.
Fasting blood glucose: 98 mg/dl

[Series 3: ctac · axial · 3.0mm · 0.98mm/px · z∈[-778,+8]mm · 4 of 263 slices shown]
[im 1/263  brain]
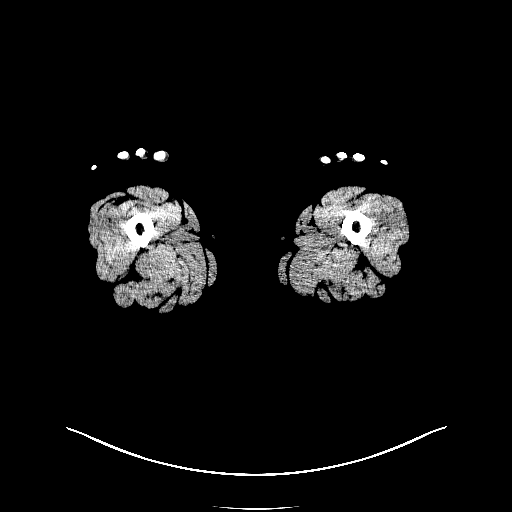
[im 88/263]
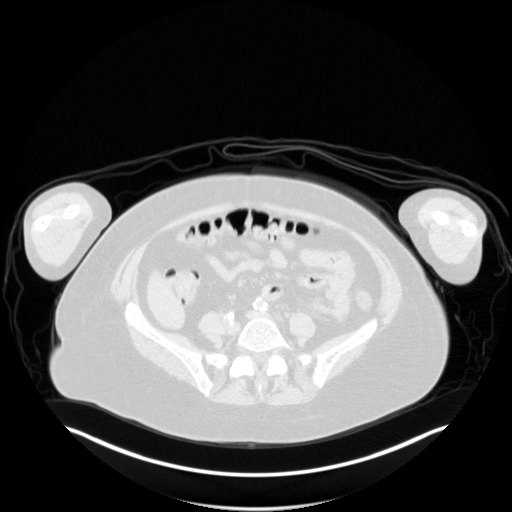
[im 175/263]
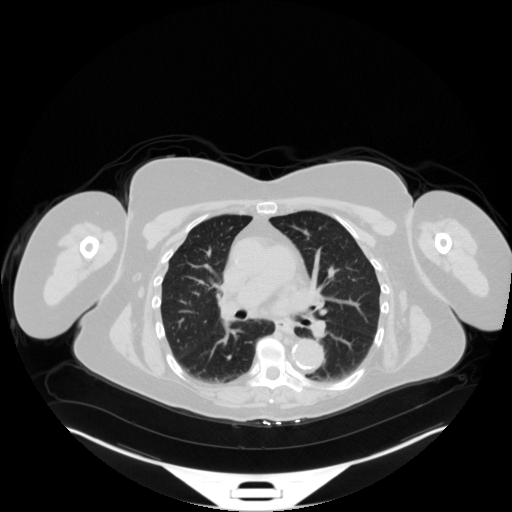
[im 263/263  brain]
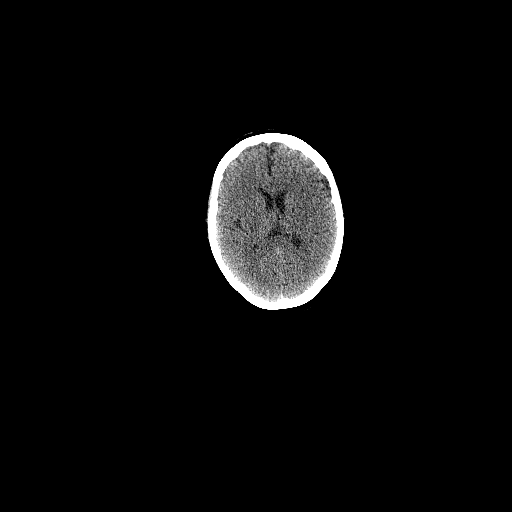

[Series 4: pet ac · axial · 3.0mm · 4.11mm/px · z∈[-778,+8]mm · 4 of 263 slices shown]
[im 1/263]
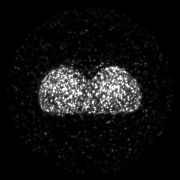
[im 88/263]
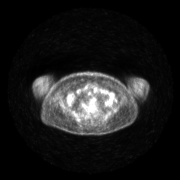
[im 175/263]
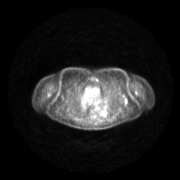
[im 263/263]
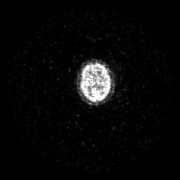

[Series 5: pet nac · axial · 3.0mm · 4.11mm/px · z∈[-778,+8]mm · 4 of 263 slices shown]
[im 1/263]
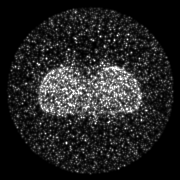
[im 88/263]
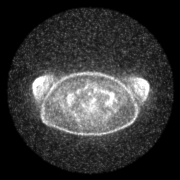
[im 175/263]
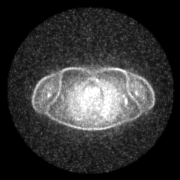
[im 263/263]
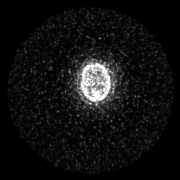

[Series 606: fused tra · 7 of 393 slices shown]
[im 1/393]
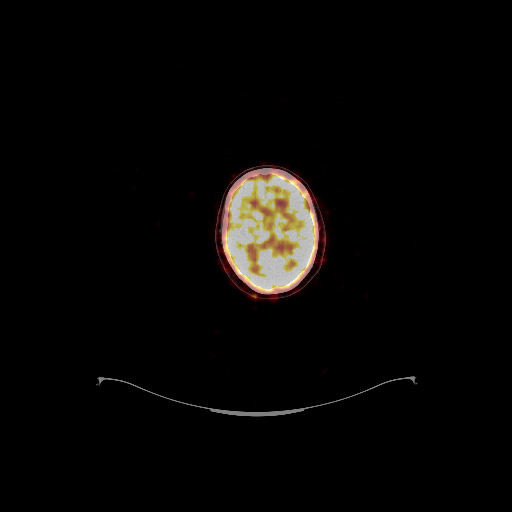
[im 66/393]
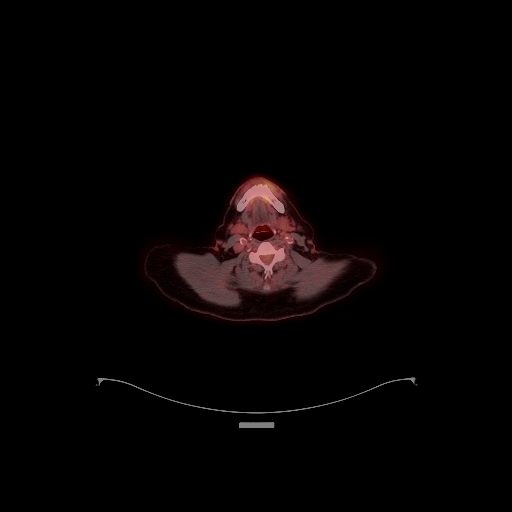
[im 131/393]
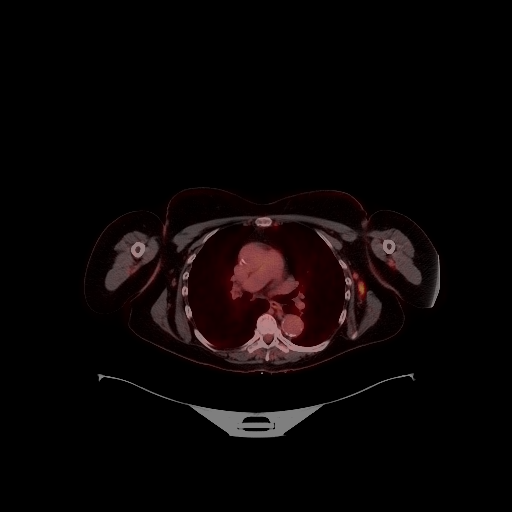
[im 197/393]
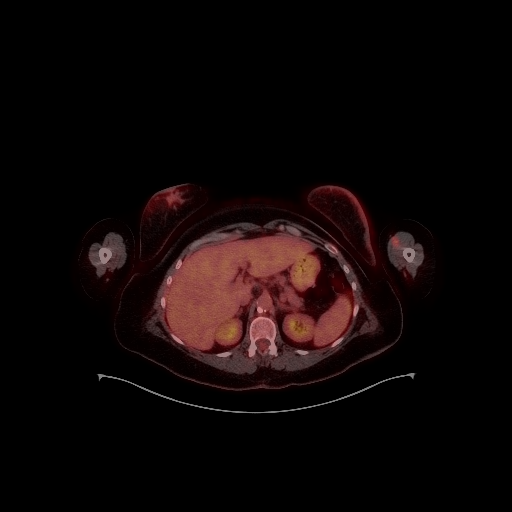
[im 262/393]
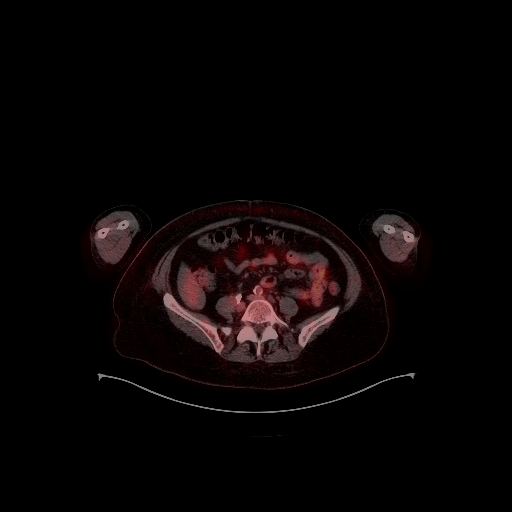
[im 327/393]
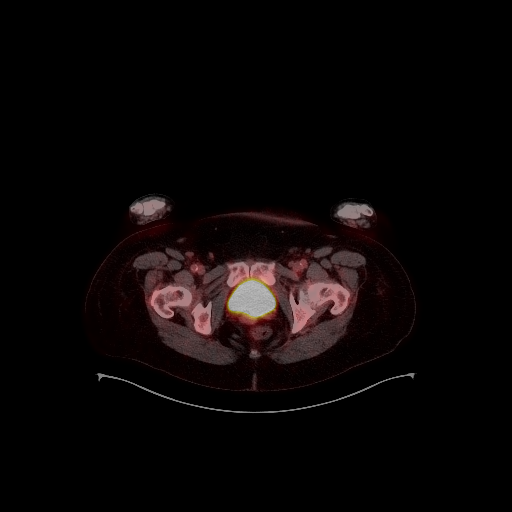
[im 393/393]
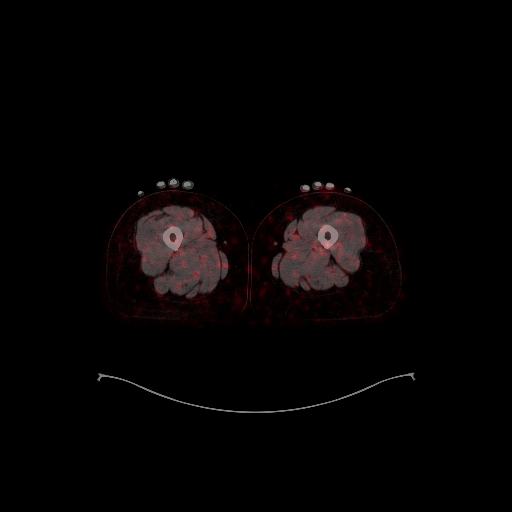

[Series 608: fused cor · 3 of 164 slices shown]
[im 1/164]
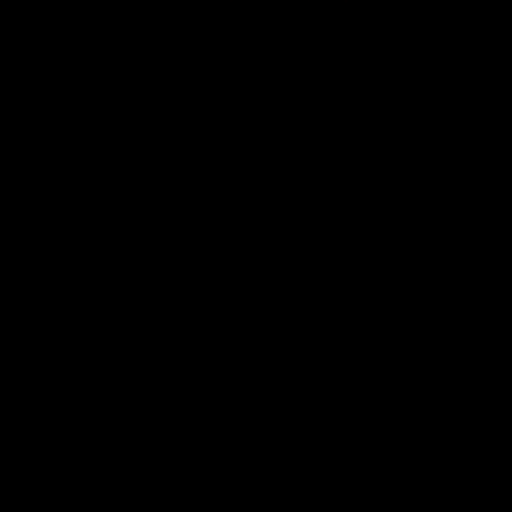
[im 82/164]
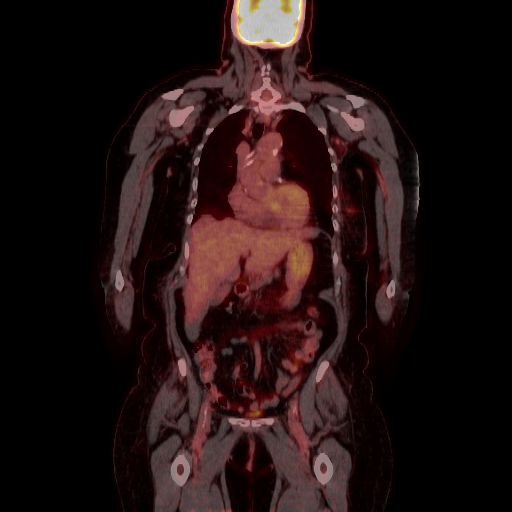
[im 164/164]
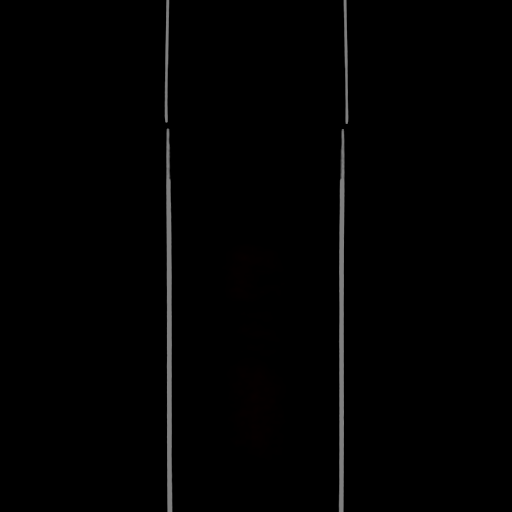

[Series 609: mip cine · coronal · 1.63mm/px · 1 of 48 slices shown]
[im 1/48]
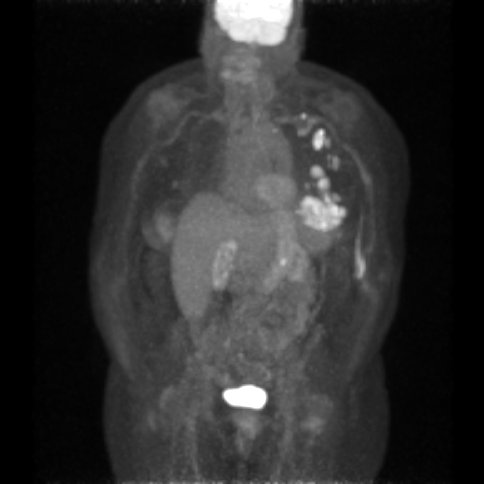

[Series 1038: results mm oncology reading · 1.0mm · 0.94mm/px · 1 of 11 slices shown]
[im 1/11]
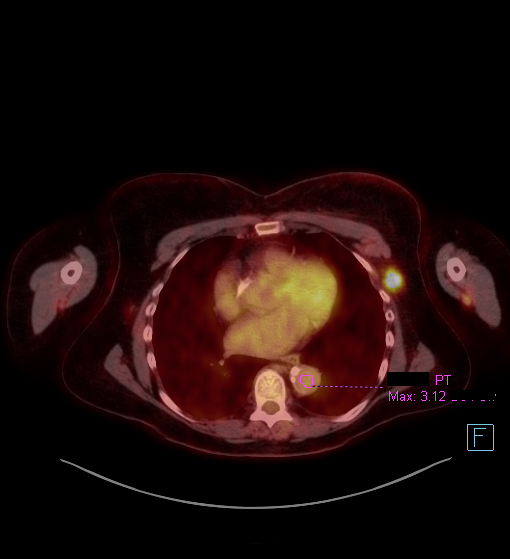

[24 of 25 positions shown; findings below may reference images not displayed]

FINDINGS: Mediastinal blood pool activity: SUV max

NECK:

No hypermetabolic cervical lymph nodes are identified.There are no
lesions of the pharyngeal mucosal space.

Incidental CT findings: Bilateral carotid atherosclerosis.

CHEST:

There is a very large hypermetabolic mass laterally in the left
breast, measuring up to 7.9 x 4.2 cm on image 119/3. This
demonstrates intense hypermetabolic activity with an SUV max of 3.4.
There are numerous hypermetabolic left axillary and subpectoral
lymph nodes. Left axillary node measuring 1.2 cm on image 77/3 has
an SUV max of 15.2. There is a small subpectoral node with an SUV
max of 7.7. There is a mildly hypermetabolic left internal mammary
node (SUV max 2.6).

There is nonspecific low-level metabolic activity within the
retroareolar portion of the left breast (SUV max 3.6). There is a
single mildly hypermetabolic right axillary lymph node, measuring 7
mm short axis on image 102/3 (SUV max 1.9).

There are 2 hypermetabolic left upper lobe pulmonary nodules. 7 mm
nodule on image [DATE] has an SUV max of 4.2. A nodule more
peripherally in the left upper lobe measuring 7 mm on image [DATE] has
an SUV max of 3.2. No other hypermetabolic or suspicious pulmonary
nodules.

Incidental CT findings: Mild centrilobular emphysema.
Atherosclerosis of the aorta, great vessels and coronary arteries.

ABDOMEN/PELVIS:

There is no hypermetabolic activity within the liver, adrenal
glands, spleen or pancreas. There is no hypermetabolic nodal
activity.

Incidental CT findings: Diffuse aortic and branch vessel
atherosclerosis with an infrarenal abdominal aortic aneurysm
measuring up to 2.8 cm in diameter. Previous hysterectomy.

SKELETON:

There is no hypermetabolic activity to suggest osseous metastatic
disease.

Incidental CT findings: none
IMPRESSION: 1. Findings are consistent with locally advanced left breast cancer.
There is a large hypermetabolic mass laterally in the left breast
with multiple hypermetabolic left axillary, subpectoral and internal
mammary lymph nodes as well as 2 hypermetabolic left upper lobe
pulmonary nodules, suspicious for metastases.
2. Nonspecific low-level subareolar activity in the right breast
near the surgical clips with a single mildly hypermetabolic right
axillary lymph node.
3. No distant metastases identified.
4. 2.8 cm infrarenal abdominal aortic aneurysm. Recommend follow-up
every 5 years.
Reference: [HOSPITAL] [WX];[DATE].
5. Coronary and aortic atherosclerosis ([WX]-[WX]). Emphysema
([WX]-[WX]).

## 2021-10-22 MED ORDER — FLUDEOXYGLUCOSE F - 18 (FDG) INJECTION
9.0800 | Freq: Once | INTRAVENOUS | Status: AC | PRN
  Administered 2021-10-22: 9.08 via INTRAVENOUS

## 2021-10-22 NOTE — Progress Notes (Signed)
REFERRING PROVIDER: Derek Jack, MD [address] [place],  [place] [address]  PRIMARY PROVIDER:  Celene Squibb, MD  PRIMARY REASON FOR VISIT:  1. Malignant neoplasm of overlapping sites of left breast in female, estrogen receptor positive ([place])   2. Family history of breast cancer   3. Family history of uterine cancer   4. Family history of bladder cancer   5. Family history of leukemia      HISTORY OF PRESENT ILLNESS:   Ms. Ladley, a 66 y.o. female, was seen for a [facility] cancer genetics consultation at the request of Dr. Delton Coombes due to a personal and family history of cancer.  Ms. Hildebrant presents to clinic today to discuss the possibility of a hereditary predisposition to cancer, genetic testing, and to further clarify her future cancer risks, as well as potential cancer risks for family members.   In 2022, at the age of 62, Ms. 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 The treatment plan includes neoadjuvant chemotherapy followed by mastectomy if no evidence of metastatic disease.   CANCER HISTORY:  Oncology History   No history exists.     RISK FACTORS:  Menarche was at age 61.  First live birth at age 62.  OCP use for approximately  12-14  years.  Ovaries intact: yes.  Hysterectomy: yes.  Menopausal status: postmenopausal.  HRT use: 0 years. Colonoscopy: no; not examined.  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 DES to midLAD, 75% stenosis of RCA not treated   Current smoker    Essential hypertension    Family history of bladder cancer    Family history of breast cancer    Family history of leukemia    Family history of uterine cancer    Hyperlipidemia LDL goal <70     Past Surgical History:  Procedure Laterality Date   ABDOMINAL HYSTERECTOMY     BREAST BIOPSY Right 10/16/2021    CORONARY STENT INTERVENTION N/A 01/10/2018   Procedure: CORONARY STENT INTERVENTION;  Surgeon: Jettie Booze, MD;  Location: [facility];  Service: Cardiovascular;  Laterality: N/A;   LEFT HEART CATH AND CORONARY ANGIOGRAPHY N/A 01/10/2018   Procedure: LEFT HEART CATH AND CORONARY ANGIOGRAPHY;  Surgeon: Jettie Booze, MD;  Location: [facility];  Service: Cardiovascular;  Laterality: N/A;    Social History   Socioeconomic History   Marital status: Married    Spouse name: Not on file   Number of children: Not on file   Years of education: Not on file   Highest education level: Not on file  Occupational History   Not on file  Tobacco Use   Smoking status: Every Day    Packs/day: 5.00    Years: 43.00    Pack years: 215.00    Types: Cigarettes    Start date: 1976   Smokeless tobacco: Never  Vaping Use   Vaping Use: Never used  Substance and Sexual Activity   Alcohol use: Yes   Drug use: Never   Sexual activity: Not on file  Other Topics Concern   Not on file  Social History Narrative   Not on file   Social Determinants of Health   Financial Resource Strain: Not on file  Food Insecurity: Not on file  Transportation Needs: Not on file  Physical Activity: Not on file  Stress: Not on file  Social Connections: Not on file     FAMILY HISTORY:  We obtained a detailed, 4-generation family history.  Significant diagnoses are listed below: Family History  Problem Relation Age of Onset   Bladder Cancer Mother 15   Cervical cancer Mother        dx 47s, hysterectomy   Hypertension Father    Cirrhosis Father    Leukemia Brother 75   Liver cancer Brother 75   Breast cancer Maternal Aunt        dx 72s   Cancer Maternal Uncle        unk type d. 5   Cancer Paternal Aunt        unk type   Breast cancer Maternal Grandmother        dx 63s   Uterine cancer Maternal Grandmother    Ms. Forrey has 1 daughter, 45. She has 3 grandchildren. She had  2 brothers, one died at 7 of leukemia and the other died at 14 of liver cancer.   Ms. Kirschenbaum mother died of bladder cancer at 50, she also had cervical cancer in her 103s and had a hysterectomy. Patient had 9 maternal half aunts/uncles. One half aunt had breast cancer in her 1s and is living at 30. A half uncle had cancer, unknown type. Maternal grandmother had breast and uterine cancer in her 49s and died at 80. No information about biological maternal grandfather.  Ms. Held father died at 51 of cirrhosis. Patient had 9 paternal aunts/uncles. One aunt had cancer late in life, unsure type. Paternal grandparents passed in their 32s.  Ms. Madrigal is unaware of previous family history of genetic testing for hereditary cancer risks. Patient's maternal ancestors are of unknown descent, and paternal ancestors are of unknown descent. There is no reported Ashkenazi Jewish ancestry. There is no known consanguinity.    GENETIC COUNSELING ASSESSMENT: Ms. Schaafsma is a 66 y.o. female with a personal and family history of breast cancer which is somewhat suggestive of a hereditary cancer syndrome and predisposition to cancer. We, therefore, discussed and recommended the following at today's visit.   DISCUSSION: We discussed that approximately 10% of breast cancer is hereditary. Most cases of hereditary breast cancer are associated with BRCA1/BRCA2 genes, although there are other genes associated with hereditary cancer as well. Cancers and risks are gene specific.  We discussed that testing is beneficial for several reasons including surgical decision-making for breast cancer, knowing about other cancer risks, identifying potential screening and risk-reduction options that may be appropriate, and to understand if other family members could be at risk for cancer and allow them to undergo genetic testing.   We reviewed the characteristics, features and inheritance patterns of hereditary cancer syndromes. We  also discussed genetic testing, including the appropriate family members to test, the process of testing, insurance coverage and turn-around-time for results. We discussed the implications of a negative, positive and/or variant of uncertain significant result. We recommended Ms. Kantner pursue genetic testing for the [facility] Breast Cancer STAT+ Multi-Cancer+RNA gene panel. This was drawn earlier in February and should report out soon.  Based on Ms. Jeremiah's personal and family history of cancer, she meets medical criteria for genetic testing. Despite that she meets criteria, she may still have an out of pocket cost. We discussed that if her out of pocket cost for testing is over $100, the laboratory will call and confirm whether she wants to proceed with testing.  If the out of pocket  cost of testing is less than $100 she will be billed by the genetic testing laboratory.   PLAN: After considering the risks, benefits, and limitations, Ms. Winton provided informed consent to pursue genetic testing and the blood sample was sent to [facility] for analysis of the Breast Cancer STAT+ Multi-Cancer+RNA panel. Results should be available within approximately 1 weeks' time, at which point they will be disclosed by telephone to Ms. Nazario, as will any additional recommendations warranted by these results. Ms. Needham will receive a summary of her genetic counseling visit and a copy of her results once available. This information will also be available in Epic.   Ms. Busk questions were answered to her satisfaction today. Our contact information was provided should additional questions or concerns arise. Thank you for the referral and allowing [place] to share in the care of your patient.   Faith Rogue, MS, [facility] Genetic Counselor [email or web address] Phone: [phone]  The patient was seen for a total of 25 minutes in virtual genetic counseling.  Dr. Grayland Ormond was available for  discussion regarding this case.   _______________________________________________________________________ For Office Staff:  Number of people involved in session: 1 Was an Intern/ student involved with case: no

## 2021-10-23 ENCOUNTER — Other Ambulatory Visit: Payer: Self-pay

## 2021-10-23 ENCOUNTER — Encounter (HOSPITAL_COMMUNITY)
Admission: RE | Admit: 2021-10-23 | Discharge: 2021-10-23 | Disposition: A | Discharge status: Home / Self Care | Payer: Managed Care, Other (non HMO) | Source: Ambulatory Visit | Attending: General Surgery | Admitting: General Surgery

## 2021-10-23 ENCOUNTER — Encounter (HOSPITAL_COMMUNITY): Payer: Self-pay

## 2021-10-23 HISTORY — DX: Other specified postprocedural states: R11.2

## 2021-10-23 HISTORY — DX: Acute myocardial infarction, unspecified: I21.9

## 2021-10-23 HISTORY — DX: Other specified postprocedural states: Z98.890

## 2021-10-26 ENCOUNTER — Encounter (HOSPITAL_COMMUNITY): Payer: Self-pay | Admitting: General Surgery

## 2021-10-26 ENCOUNTER — Other Ambulatory Visit: Payer: Self-pay

## 2021-10-26 ENCOUNTER — Ambulatory Visit (HOSPITAL_COMMUNITY): Payer: Managed Care, Other (non HMO) | Admitting: Certified Registered Nurse Anesthetist

## 2021-10-26 ENCOUNTER — Ambulatory Visit (HOSPITAL_COMMUNITY): Payer: Managed Care, Other (non HMO)

## 2021-10-26 ENCOUNTER — Ambulatory Visit (HOSPITAL_COMMUNITY)
Admission: RE | Admit: 2021-10-26 | Discharge: 2021-10-26 | Disposition: A | Discharge status: Home / Self Care | Payer: Managed Care, Other (non HMO) | Attending: General Surgery | Admitting: General Surgery

## 2021-10-26 ENCOUNTER — Ambulatory Visit (HOSPITAL_BASED_OUTPATIENT_CLINIC_OR_DEPARTMENT_OTHER): Payer: Managed Care, Other (non HMO) | Admitting: Certified Registered Nurse Anesthetist

## 2021-10-26 ENCOUNTER — Encounter (HOSPITAL_COMMUNITY)
Admission: RE | Disposition: A | Discharge status: Home / Self Care | Payer: Self-pay | Source: Home / Self Care | Attending: General Surgery

## 2021-10-26 DIAGNOSIS — C50912 Malignant neoplasm of unspecified site of left female breast: Secondary | ICD-10-CM | POA: Diagnosis present

## 2021-10-26 DIAGNOSIS — I252 Old myocardial infarction: Secondary | ICD-10-CM

## 2021-10-26 DIAGNOSIS — F17210 Nicotine dependence, cigarettes, uncomplicated: Secondary | ICD-10-CM | POA: Diagnosis not present

## 2021-10-26 DIAGNOSIS — Z803 Family history of malignant neoplasm of breast: Secondary | ICD-10-CM | POA: Diagnosis not present

## 2021-10-26 DIAGNOSIS — Z955 Presence of coronary angioplasty implant and graft: Secondary | ICD-10-CM | POA: Diagnosis not present

## 2021-10-26 DIAGNOSIS — I10 Essential (primary) hypertension: Secondary | ICD-10-CM

## 2021-10-26 DIAGNOSIS — I2510 Atherosclerotic heart disease of native coronary artery without angina pectoris: Secondary | ICD-10-CM | POA: Insufficient documentation

## 2021-10-26 DIAGNOSIS — Z95828 Presence of other vascular implants and grafts: Secondary | ICD-10-CM

## 2021-10-26 DIAGNOSIS — C50919 Malignant neoplasm of unspecified site of unspecified female breast: Secondary | ICD-10-CM | POA: Diagnosis not present

## 2021-10-26 HISTORY — PX: PORTACATH PLACEMENT: SHX2246

## 2021-10-26 HISTORY — DX: Malignant (primary) neoplasm, unspecified: C80.1

## 2021-10-26 IMAGING — DX DG CHEST 1V PORT
1 series · 1 of 1 positions shown · non-contrast
Comparison: No priors.

CLINICAL DATA: 65-year-old female with history of breast cancer
status post Port-A-Cath placement.

EXAM:
PORTABLE CHEST 1 VIEW

[chest ap]
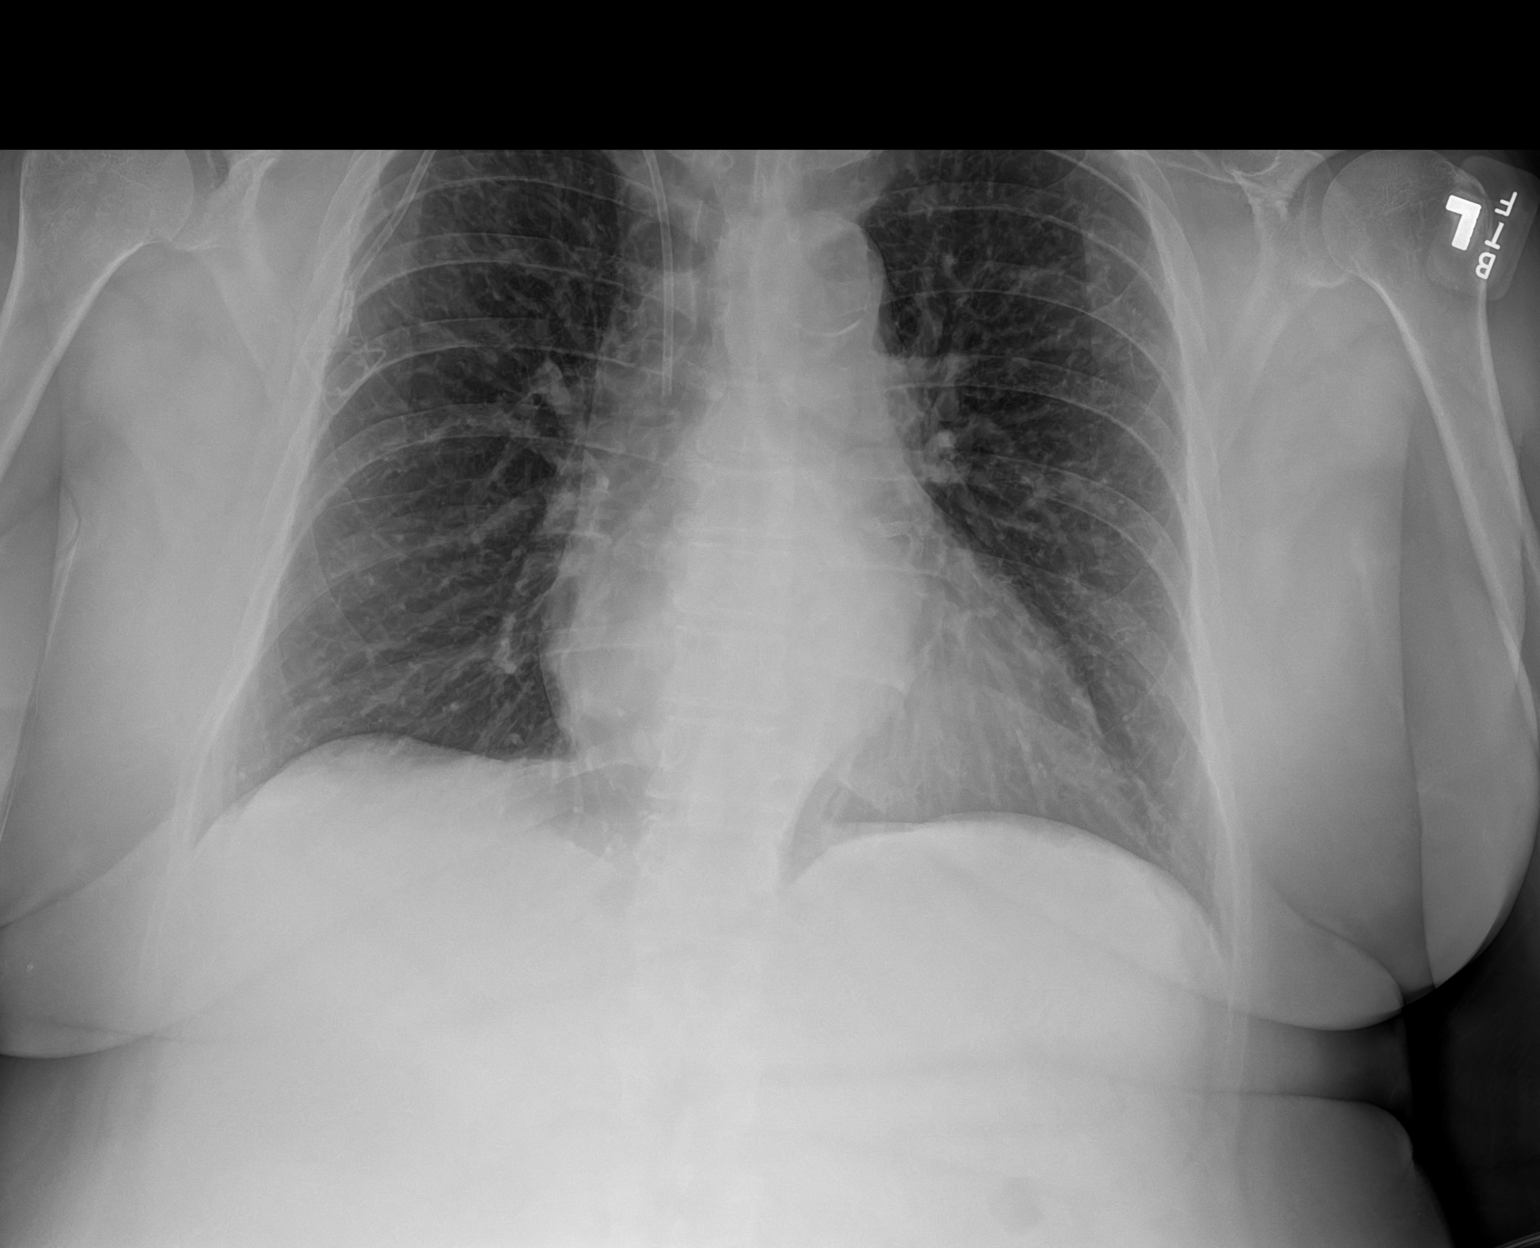

[1 of 1 positions shown; findings below may reference images not displayed]

FINDINGS: Right subclavian single-lumen power porta cath with tip terminating
in the mid superior vena cava. Lung volumes are normal. No
consolidative airspace disease. No pleural effusions. No
pneumothorax. No pulmonary nodule or mass noted. Pulmonary
vasculature and the cardiomediastinal silhouette are within normal
limits. Atherosclerosis in the thoracic aorta.
IMPRESSION: 1. No definite signs of metastatic disease in the thorax. No
radiographic evidence of acute cardiopulmonary disease.
2. Aortic atherosclerosis.

## 2021-10-26 SURGERY — INSERTION, TUNNELED CENTRAL VENOUS DEVICE, WITH PORT
Anesthesia: General | Site: Chest | Laterality: Right

## 2021-10-26 MED ORDER — PROPOFOL 10 MG/ML IV BOLUS
INTRAVENOUS | Status: DC | PRN
Start: 2021-10-26 — End: 2021-10-26
  Administered 2021-10-26: 30 mg via INTRAVENOUS

## 2021-10-26 MED ORDER — PROPOFOL 10 MG/ML IV BOLUS
INTRAVENOUS | Status: AC
  Filled 2021-10-26: qty 20

## 2021-10-26 MED ORDER — HEPARIN SOD (PORK) LOCK FLUSH 100 UNIT/ML IV SOLN
INTRAVENOUS | Status: DC | PRN
  Administered 2021-10-26: 500 [IU] via INTRAVENOUS

## 2021-10-26 MED ORDER — SODIUM CHLORIDE (PF) 0.9 % IJ SOLN
INTRAMUSCULAR | Status: DC | PRN
  Administered 2021-10-26: 20 mL via INTRAVENOUS

## 2021-10-26 MED ORDER — MIDAZOLAM HCL 2 MG/2ML IJ SOLN
INTRAMUSCULAR | Status: DC | PRN
  Administered 2021-10-26: 2 mg via INTRAVENOUS

## 2021-10-26 MED ORDER — MIDAZOLAM HCL 2 MG/2ML IJ SOLN
INTRAMUSCULAR | Status: AC
  Filled 2021-10-26: qty 2

## 2021-10-26 MED ORDER — OXYCODONE HCL 5 MG PO TABS
5.0000 mg | ORAL_TABLET | ORAL | 0 refills | Status: DC | PRN
Start: 2021-10-26 — End: 2022-01-02

## 2021-10-26 MED ORDER — HEPARIN SOD (PORK) LOCK FLUSH 100 UNIT/ML IV SOLN
INTRAVENOUS | Status: AC
  Filled 2021-10-26: qty 5

## 2021-10-26 MED ORDER — ONDANSETRON HCL 4 MG/2ML IJ SOLN: 4.0000 mg | Freq: Once | INTRAMUSCULAR | Status: DC | PRN

## 2021-10-26 MED ORDER — LACTATED RINGERS IV SOLN: INTRAVENOUS | Status: DC

## 2021-10-26 MED ORDER — LIDOCAINE HCL (CARDIAC) PF 100 MG/5ML IV SOSY
PREFILLED_SYRINGE | INTRAVENOUS | Status: DC | PRN
  Administered 2021-10-26: 50 mg via INTRATRACHEAL

## 2021-10-26 MED ORDER — LIDOCAINE HCL (PF) 1 % IJ SOLN
INTRAMUSCULAR | Status: AC
  Filled 2021-10-26: qty 30

## 2021-10-26 MED ORDER — LIDOCAINE HCL (PF) 1 % IJ SOLN
INTRAMUSCULAR | Status: DC | PRN
  Administered 2021-10-26: 6 mL

## 2021-10-26 MED ORDER — CHLORHEXIDINE GLUCONATE 0.12 % MT SOLN
15.0000 mL | Freq: Once | OROMUCOSAL | Status: AC
  Administered 2021-10-26: 15 mL via OROMUCOSAL

## 2021-10-26 MED ORDER — ORAL CARE MOUTH RINSE: 15.0000 mL | Freq: Once | OROMUCOSAL | Status: AC

## 2021-10-26 MED ORDER — FENTANYL CITRATE PF 50 MCG/ML IJ SOSY: 25.0000 ug | PREFILLED_SYRINGE | INTRAMUSCULAR | Status: DC | PRN

## 2021-10-26 MED ORDER — FENTANYL CITRATE (PF) 100 MCG/2ML IJ SOLN
INTRAMUSCULAR | Status: DC | PRN
  Administered 2021-10-26 (×2): 25 ug via INTRAVENOUS

## 2021-10-26 MED ORDER — CHLORHEXIDINE GLUCONATE CLOTH 2 % EX PADS: 6.0000 | MEDICATED_PAD | Freq: Once | CUTANEOUS | Status: DC

## 2021-10-26 MED ORDER — ONDANSETRON HCL 4 MG PO TABS: 4.0000 mg | ORAL_TABLET | Freq: Three times a day (TID) | ORAL | 1 refills | Status: DC | PRN

## 2021-10-26 MED ORDER — CEFAZOLIN SODIUM-DEXTROSE 2-4 GM/100ML-% IV SOLN
2.0000 g | INTRAVENOUS | Status: AC
  Administered 2021-10-26: 2 g via INTRAVENOUS
  Filled 2021-10-26: qty 100

## 2021-10-26 MED ORDER — FENTANYL CITRATE (PF) 100 MCG/2ML IJ SOLN
INTRAMUSCULAR | Status: AC
  Filled 2021-10-26: qty 2

## 2021-10-26 MED ORDER — PROPOFOL 500 MG/50ML IV EMUL
INTRAVENOUS | Status: DC | PRN
  Administered 2021-10-26: 30 ug/kg/min via INTRAVENOUS

## 2021-10-26 MED ORDER — LIDOCAINE HCL (PF) 2 % IJ SOLN
INTRAMUSCULAR | Status: AC
  Filled 2021-10-26: qty 5

## 2021-10-26 SURGICAL SUPPLY — 36 items
ADH SKN CLS APL DERMABOND .7 (GAUZE/BANDAGES/DRESSINGS) ×1
APL PRP STRL LF ISPRP CHG 10.5 (MISCELLANEOUS) ×1
APPLICATOR CHLORAPREP 10.5 ORG (MISCELLANEOUS) ×2 IMPLANT
APPLIER CLIP 9.375 SM OPEN (CLIP)
APR CLP SM 9.3 20 MLT OPN (CLIP)
BAG DECANTER FOR FLEXI CONT (MISCELLANEOUS) ×2 IMPLANT
BLADE SURG SZ11 CARB STEEL (BLADE) ×1 IMPLANT
CLIP APPLIE 9.375 SM OPEN (CLIP) IMPLANT
CLOTH BEACON ORANGE TIMEOUT ST (SAFETY) ×2 IMPLANT
COVER LIGHT HANDLE STERIS (MISCELLANEOUS) ×4 IMPLANT
COVER PROBE U/S 5X48 (MISCELLANEOUS) ×1 IMPLANT
DECANTER SPIKE VIAL GLASS SM (MISCELLANEOUS) ×2 IMPLANT
DERMABOND ADVANCED (GAUZE/BANDAGES/DRESSINGS) ×1
DERMABOND ADVANCED .7 DNX12 (GAUZE/BANDAGES/DRESSINGS) ×1 IMPLANT
DRAPE C-ARM FOLDED MOBILE STRL (DRAPES) ×2 IMPLANT
ELECT REM PT RETURN 9FT ADLT (ELECTROSURGICAL) ×2
ELECTRODE REM PT RTRN 9FT ADLT (ELECTROSURGICAL) ×1 IMPLANT
GLOVE SURG ENC MOIS LTX SZ6.5 (GLOVE) ×2 IMPLANT
GLOVE SURG UNDER POLY LF SZ7 (GLOVE) ×4 IMPLANT
GOWN STRL REUS W/TWL LRG LVL3 (GOWN DISPOSABLE) ×4 IMPLANT
IV NS 500ML (IV SOLUTION) ×2
IV NS 500ML BAXH (IV SOLUTION) ×1 IMPLANT
KIT PORT POWER 8FR ISP MRI (Port) ×2 IMPLANT
KIT TURNOVER KIT A (KITS) ×2 IMPLANT
MANIFOLD NEPTUNE II (INSTRUMENTS) ×2 IMPLANT
NDL HYPO 25X1 1.5 SAFETY (NEEDLE) ×1 IMPLANT
NEEDLE HYPO 25X1 1.5 SAFETY (NEEDLE) ×2 IMPLANT
PACK MINOR (CUSTOM PROCEDURE TRAY) ×2 IMPLANT
PAD ARMBOARD 7.5X6 YLW CONV (MISCELLANEOUS) ×2 IMPLANT
SET BASIN LINEN APH (SET/KITS/TRAYS/PACK) ×2 IMPLANT
SUT MNCRL AB 4-0 PS2 18 (SUTURE) ×2 IMPLANT
SUT PROLENE 2 0 SH 30 (SUTURE) ×2 IMPLANT
SUT VIC AB 3-0 SH 27 (SUTURE) ×2
SUT VIC AB 3-0 SH 27X BRD (SUTURE) ×1 IMPLANT
SYR 10ML LL (SYRINGE) ×4 IMPLANT
SYR CONTROL 10ML LL (SYRINGE) ×2 IMPLANT

## 2021-10-26 NOTE — Anesthesia Preprocedure Evaluation (Signed)
Anesthesia Evaluation  ?Patient identified by MRN, date of birth, ID band ?Patient awake ? ? ? ?Reviewed: ?Allergy & Precautions, H&P , NPO status , Patient's Chart, lab work & pertinent test results, reviewed documented beta blocker date and time  ? ?History of Anesthesia Complications ?(+) PONV and history of anesthetic complications ? ?Airway ?Mallampati: II ? ?TM Distance: >3 FB ?Neck ROM: full ? ? ? Dental ?no notable dental hx. ? ?  ?Pulmonary ?neg pulmonary ROS, Current Smoker,  ?  ?Pulmonary exam normal ?breath sounds clear to auscultation ? ? ? ? ? ? Cardiovascular ?Exercise Tolerance: Good ?hypertension, + CAD, + Past MI and + Cardiac Stents  ? ?Rhythm:regular Rate:Normal ? ? ?  ?Neuro/Psych ?negative neurological ROS ? negative psych ROS  ? GI/Hepatic ?negative GI ROS, Neg liver ROS,   ?Endo/Other  ?negative endocrine ROS ? Renal/GU ?negative Renal ROS  ?negative genitourinary ?  ?Musculoskeletal ? ? Abdominal ?  ?Peds ? Hematology ?negative hematology ROS ?(+)   ?Anesthesia Other Findings ??1. Left ventricular ejection fraction, by estimation, is 60 to 65%. The  ?left ventricle has normal function. The left ventricle has no regional  ?wall motion abnormalities. There is mild left ventricular hypertrophy of  ?the basal-septal segment. Left  ?ventricular diastolic parameters are consistent with Grade I diastolic  ?dysfunction (impaired relaxation).  ??2. Right ventricular systolic function is normal. The right ventricular  ?size is normal.  ??3. The mitral valve is normal in structure. Mild mitral valve  ?regurgitation. No evidence of mitral stenosis.  ??4. The aortic valve has an indeterminant number of cusps. Aortic valve  ?regurgitation is not visualized. No aortic stenosis is present.  ??5. The inferior vena cava is normal in size with greater than 50%  ?respiratory variability, suggesting right atrial pressure of 3 mmHg.  ? Reproductive/Obstetrics ?negative OB ROS ? ?   ? ? ? ? ? ? ? ? ? ? ? ? ? ?  ?  ? ? ? ? ? ? ? ? ?Anesthesia Physical ?Anesthesia Plan ? ?ASA: 3 ? ?Anesthesia Plan: General  ? ?Post-op Pain Management:   ? ?Induction:  ? ?PONV Risk Score and Plan: Propofol infusion ? ?Airway Management Planned:  ? ?Additional Equipment:  ? ?Intra-op Plan:  ? ?Post-operative Plan:  ? ?Informed Consent: I have reviewed the patients History and Physical, chart, labs and discussed the procedure including the risks, benefits and alternatives for the proposed anesthesia with the patient or authorized representative who has indicated his/her understanding and acceptance.  ? ? ? ?Dental Advisory Given ? ?Plan Discussed with: CRNA ? ?Anesthesia Plan Comments:   ? ? ? ? ? ? ?Anesthesia Quick Evaluation ? ?

## 2021-10-26 NOTE — Transfer of Care (Signed)
Immediate Anesthesia Transfer of Care Note ? ?Patient: Traci Fuller ? ?Procedure(s) Performed: INSERTION PORT-A-CATH (Right: Chest) ? ?Patient Location: Short Stay ? ?Anesthesia Type:General ? ?Level of Consciousness: awake, alert  and oriented ? ?Airway & Oxygen Therapy: Patient Spontanous Breathing ? ?Post-op Assessment: Report given to RN and Post -op Vital signs reviewed and stable ? ?Post vital signs: Reviewed and Stable  ? ?Last Vitals:  ?Vitals Value Taken Time  ?BP 114/71 10/26/21 0811  ?Temp 36.6 ?C 10/26/21 0811  ?Pulse 58 10/26/21 0811  ?Resp 16 10/26/21 0811  ?SpO2 98 % 10/26/21 0811  ? ? ?Last Pain:  ?Vitals:  ? 10/26/21 0811  ?TempSrc: Oral  ?PainSc: 0-No pain  ?   ? ?  ? ?Complications: No notable events documented. ?

## 2021-10-26 NOTE — Discharge Instructions (Signed)
Keep area clean and dry. You can take a shower in 24 hours. Do not submerge in water.  ?Take tylenol and ibuprofen for pain control and roxicodone for severe pain. ?Restart your Brilinta 10/27/2021. ? ? ?

## 2021-10-26 NOTE — Interval H&P Note (Signed)
History and Physical Interval Note: ? ?10/26/2021 ?7:26 AM ? ?Traci Fuller  has presented today for surgery, with the diagnosis of Left breast cancer.  The various methods of treatment have been discussed with the patient and family. After consideration of risks, benefits and other options for treatment, the patient has consented to  Procedure(s): ?INSERTION PORT-A-CATH (N/A) as a surgical intervention.  The patient's history has been reviewed, patient examined, no change in status, stable for surgery.  I have reviewed the patient's chart and labs.  Questions were answered to the patient's satisfaction.   ? ?Discussed risk of bleeding, infection, pneumothorax, malfunction, and injury to vessels. ? ?Traci Fuller ? ? ?

## 2021-10-26 NOTE — Anesthesia Postprocedure Evaluation (Signed)
Anesthesia Post Note ? ?Patient: Traci Fuller ? ?Procedure(s) Performed: INSERTION PORT-A-CATH (Right: Chest) ? ?Patient location during evaluation: Phase II ?Anesthesia Type: General ?Level of consciousness: awake ?Pain management: pain level controlled ?Vital Signs Assessment: post-procedure vital signs reviewed and stable ?Respiratory status: spontaneous breathing and respiratory function stable ?Cardiovascular status: blood pressure returned to baseline and stable ?Postop Assessment: no headache and no apparent nausea or vomiting ?Anesthetic complications: no ?Comments: Late entry ? ? ?No notable events documented. ? ? ?Last Vitals:  ?Vitals:  ? 10/26/21 0811 10/26/21 0835  ?BP: 114/71 126/77  ?Pulse: (!) 58 (!) 56  ?Resp: 16 16  ?Temp: 36.6 ?C   ?SpO2: 98% 98%  ?  ?Last Pain:  ?Vitals:  ? 10/26/21 0835  ?TempSrc:   ?PainSc: 0-No pain  ? ? ?  ?  ?  ?  ?  ?  ? ?Louann Sjogren ? ? ? ? ?

## 2021-10-26 NOTE — Progress Notes (Signed)
[facility] ? ?No pneumothorax, port in appropriate place. ? ?Can DC home  ? ?Curlene Labrum MD

## 2021-10-26 NOTE — Progress Notes (Signed)
[facility] ? ?Updated family. Cxr ordered. Will verify before going home. ? ?Traci Labrum, MD ?[facility] ?[place], [place] [address] ?[phone] (office) ? ?

## 2021-10-26 NOTE — Op Note (Signed)
Operative Note ?10/26/21 ?  ?Preoperative Diagnosis: Left breast cancer  ?  ?Postoperative Diagnosis: Same ?  ?Procedure(s) Performed: Port-A-Cath placement, right ?  ?Surgeon: Lanell Matar. Constance Haw, MD ?  ?Assistants: No qualified resident was available ?  ?Anesthesia: Monitored anesthesia care ?  ?Anesthesiologist: Dr. Briant Cedar  ? ?Specimens: None ?  ?Estimated Blood Loss: Minimal  ? ?Fluoroscopy time: 9 seconds ?  ?Blood Replacement: None  ?  ?Complications: None  ?  ?Operative Findings: Normal anatomy ? ?Indications: Ms. Buzzelli is a 66 yo with a left sided breast cancer with local spread to her lymph nodes. She will undergo neoadjuvant treatment. We discussed port placement and the risk of bleeding, infection, injury, pneumothorax, malfunction. ? ?Procedure: The patient was brought into the operating room and monitored anesthesia care was induced.  One percent lidocaine was used for local anesthesia.   The right chest and neck was prepped and draped in the usual sterile fashion.  Preoperative antibiotics were given.  An incision was made below the right clavicle. A subcutaneous pocket was formed. The needles advanced into the  subclavian vein using the Seldinger technique without difficulty. A guidewire was then advanced into the right atrium under fluoroscopic guidance.  Ectopia was not noted. An introducer and peel-away sheath were placed over the guidewire. The catheter was then inserted through the peel-away sheath and the peel-away sheath was removed.  A spot film was performed to confirm the position. The catheter was then attached to the port and the port placed in subcutaneous pocket. Adequate positioning was confirmed by fluoroscopy. Hemostasis was confirmed, and the port was secured with 2-0 prolene sutures.  Good backflow of blood was noted on aspiration of the port. The port was flushed with heparin flush. Subcutaneous layer was reapproximated using a 3-0 Vicryl interrupted suture. The skin was closed  using a 4-0 Vicryl subcuticular suture. Dermabond was applied. ?   ?All tape and needle counts were correct at the end of the procedure. The patient was transferred to PACU in stable condition. A chest x-ray will be performed at that time. ? ?Curlene Labrum, MD ?[facility] ?[place], [place] [address] ?[phone] (office) ? ? ? ? ?

## 2021-10-27 NOTE — Progress Notes (Signed)
Has follow up 10/28/21

## 2021-10-28 ENCOUNTER — Encounter (HOSPITAL_COMMUNITY): Payer: Self-pay | Admitting: Hematology

## 2021-10-28 ENCOUNTER — Inpatient Hospital Stay (HOSPITAL_COMMUNITY): Payer: Managed Care, Other (non HMO) | Attending: Hematology | Admitting: Hematology

## 2021-10-28 ENCOUNTER — Other Ambulatory Visit: Payer: Self-pay

## 2021-10-28 VITALS — BP 142/92 | HR 85 | Temp 99.4°F | Resp 19 | Ht 61.0 in | Wt 172.6 lb

## 2021-10-28 DIAGNOSIS — C50812 Malignant neoplasm of overlapping sites of left female breast: Secondary | ICD-10-CM | POA: Diagnosis present

## 2021-10-28 DIAGNOSIS — E785 Hyperlipidemia, unspecified: Secondary | ICD-10-CM | POA: Insufficient documentation

## 2021-10-28 DIAGNOSIS — C50919 Malignant neoplasm of unspecified site of unspecified female breast: Secondary | ICD-10-CM

## 2021-10-28 DIAGNOSIS — Z79899 Other long term (current) drug therapy: Secondary | ICD-10-CM | POA: Diagnosis not present

## 2021-10-28 DIAGNOSIS — Z5111 Encounter for antineoplastic chemotherapy: Secondary | ICD-10-CM | POA: Diagnosis present

## 2021-10-28 DIAGNOSIS — K1379 Other lesions of oral mucosa: Secondary | ICD-10-CM | POA: Diagnosis not present

## 2021-10-28 DIAGNOSIS — Z170 Estrogen receptor positive status [ER+]: Secondary | ICD-10-CM

## 2021-10-28 DIAGNOSIS — E860 Dehydration: Secondary | ICD-10-CM | POA: Diagnosis not present

## 2021-10-28 DIAGNOSIS — R197 Diarrhea, unspecified: Secondary | ICD-10-CM | POA: Insufficient documentation

## 2021-10-28 DIAGNOSIS — R634 Abnormal weight loss: Secondary | ICD-10-CM | POA: Diagnosis not present

## 2021-10-28 DIAGNOSIS — C7802 Secondary malignant neoplasm of left lung: Secondary | ICD-10-CM | POA: Insufficient documentation

## 2021-10-28 MED ORDER — DIPHENOXYLATE-ATROPINE 2.5-0.025 MG PO TABS: 1.0000 | ORAL_TABLET | Freq: Four times a day (QID) | ORAL | 3 refills | Status: DC | PRN

## 2021-10-28 NOTE — Research (Signed)
[id number] - TREATMENT OF REFRACTORY NAUSEA ? ?Rec'd referral from Dr Tomie China office. Plan to meet with patient after Chemo Ed on 11/03/21. ? ?Marjie Skiff Lutricia Widjaja, RN, BSN, CHPN ?[facility] ?Clinical Research Nurse ?[place] ?Direct Dial [phone]  Pager [phone] ?10/28/2021 4:42 PM ?

## 2021-10-28 NOTE — Progress Notes (Signed)
START ON PATHWAY REGIMEN - Breast ? ? ?  Cycle 1: A cycle is 21 days: ?    Pertuzumab  ?    Trastuzumab-xxxx  ?    Docetaxel  ?    Carboplatin  ?  Cycles 2 through 6: A cycle is every 21 days: ?    Pertuzumab  ?    Trastuzumab-xxxx  ?    Docetaxel  ?    Carboplatin  ? ?**Always confirm dose/schedule in your pharmacy ordering system** ? ?Patient Characteristics: ?Preoperative or Nonsurgical Candidate (Clinical Staging), Neoadjuvant Therapy followed by Surgery, Invasive Disease, Chemotherapy, HER2 Positive, ER Positive ?Therapeutic Status: Preoperative or Nonsurgical Candidate (Clinical Staging) ?AJCC M Category: cM0 ?AJCC Grade: G2 ?Breast Surgical Plan: Neoadjuvant Therapy followed by Surgery ?ER Status: Positive (+) ?AJCC 8 Stage Grouping: IIA ?HER2 Status: Positive (+) ?AJCC T Category: cT3 ?AJCC N Category: cN1 ?PR Status: Positive (+) ?Intent of Therapy: ?Curative Intent, Discussed with Patient ?

## 2021-10-28 NOTE — Telephone Encounter (Signed)
Repatha sureclick pa approved until 10/27/22 ?

## 2021-10-28 NOTE — Progress Notes (Signed)
Traci Fuller [address] [place] [address]   Patient Care Team: Celene Squibb, MD as PCP - General (Internal Medicine) Lorretta Harp, MD as PCP - Cardiology (Cardiology) Derek Jack, MD as Medical Oncologist (Medical Oncology) Brien Mates, RN as Oncology Nurse Navigator (Medical Oncology)  SUMMARY OF ONCOLOGIC HISTORY: Oncology History   No history exists.    CHIEF COMPLIANT: Follow-up of locally advanced left breast cancer   INTERVAL HISTORY: Traci Fuller is a 66 y.o. female here today for follow up of her locally advanced left breast cancer. Her last visit was on 10/15/2021.   Today Traci Fuller reports feeling good. Traci Fuller denies recent infections. Traci Fuller currently smokes.   REVIEW OF SYSTEMS:   Review of Systems  Constitutional:  Negative for appetite change and fatigue.  Gastrointestinal:  Positive for nausea.  Psychiatric/Behavioral:  The patient is nervous/anxious.   All other systems reviewed and are negative.  I have reviewed the past medical history, past surgical history, social history and family history with the patient and they are unchanged from previous note.   ALLERGIES:   has No Known Allergies.   MEDICATIONS:  Current Outpatient Medications  Medication Sig Dispense Refill   ALPRAZolam (XANAX) 0.25 MG tablet Take 1 tablet (0.25 mg total) by mouth 2 (two) times daily as needed for anxiety. 60 tablet 0   amLODipine (NORVASC) 10 MG tablet Take 10 mg by mouth daily.     aspirin EC 81 MG EC tablet Take 1 tablet (81 mg total) by mouth daily. 90 tablet 3   co-enzyme Q-10 30 MG capsule Take 30 mg by mouth daily.     Evolocumab (REPATHA SURECLICK) 675 MG/ML SOAJ Inject 140 mg into the skin every 14 (fourteen) days. 2 mL 11   gabapentin (NEURONTIN) 300 MG capsule Take 300 mg by mouth 2 (two) times daily.     levocetirizine (XYZAL) 5 MG tablet Take 5 mg by mouth every evening.     metoprolol tartrate (LOPRESSOR) 25 MG tablet  Take 1 tablet (25 mg total) by mouth 2 (two) times daily. Please call and schedule overdue appointment with Dr. Gwenlyn Found for further refills . SECOND ATTEMPT! 30 tablet 0   omeprazole (PRILOSEC) 20 MG capsule Take 20 mg by mouth daily.     ondansetron (ZOFRAN) 4 MG tablet Take 1 tablet (4 mg total) by mouth every 8 (eight) hours as needed. (Patient not taking: Reported on 10/28/2021) 30 tablet 1   oxyCODONE (ROXICODONE) 5 MG immediate release tablet Take 1 tablet (5 mg total) by mouth every 4 (four) hours as needed for severe pain or breakthrough pain. (Patient not taking: Reported on 10/28/2021) 5 tablet 0   ticagrelor (BRILINTA) 90 MG TABS tablet Take 1 tablet (90 mg total) by mouth 2 (two) times daily. 180 tablet 3   No current facility-administered medications for this visit.     PHYSICAL EXAMINATION: Performance status (ECOG): 1 - Symptomatic but completely ambulatory  There were no vitals filed for this visit. Wt Readings from Last 3 Encounters:  10/23/21 171 lb (77.6 kg)  10/16/21 172 lb (78 kg)  10/15/21 171 lb (77.6 kg)   Physical Exam Vitals reviewed.  Constitutional:      Appearance: Normal appearance. Traci Fuller is obese.  Cardiovascular:     Rate and Rhythm: Normal rate and regular rhythm.     Pulses: Normal pulses.     Heart sounds: Normal heart sounds.  Pulmonary:     Effort:  Pulmonary effort is normal.     Breath sounds: Normal breath sounds.  Neurological:     General: No focal deficit present.     Mental Status: Traci Fuller is alert and oriented to person, place, and time.  Psychiatric:        Mood and Affect: Mood normal.        Behavior: Behavior normal.    Breast Exam Chaperone: Thana Ates     LABORATORY DATA:  I have reviewed the data as listed CMP Latest Ref Rng & Units 10/15/2021 03/09/2018 01/19/2018  Glucose 70 - 99 mg/dL 101(H) - 78  BUN 8 - 23 mg/dL 24(H) - 10  Creatinine 0.44 - 1.00 mg/dL 1.25(H) - 0.81  Sodium 135 - 145 mmol/L 135 - 142  Potassium 3.5 - 5.1  mmol/L 4.4 - 4.9  Chloride 98 - 111 mmol/L 104 - 103  CO2 22 - 32 mmol/L 23 - 24  Calcium 8.9 - 10.3 mg/dL 10.3 - 10.3  Total Protein 6.5 - 8.1 g/dL 8.3(H) 6.6 -  Total Bilirubin 0.3 - 1.2 mg/dL 0.6 0.6 -  Alkaline Phos 38 - 126 U/L 113 185(H) -  AST 15 - 41 U/L 23 23 -  ALT 0 - 44 U/L 26 18 -   Lab Results  Component Value Date   CAN153 130.0 (H) 10/15/2021   Lab Results  Component Value Date   WBC 11.1 (H) 10/15/2021   HGB 14.5 10/15/2021   HCT 44.9 10/15/2021   MCV 93.0 10/15/2021   PLT 337 10/15/2021   NEUTROABS 8.6 (H) 10/15/2021    ASSESSMENT:  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 - Traci Fuller noticed lump has gotten bigger and deformed since September 2022.  Traci Fuller has itching over the skin of the breast. - Left breast 4:00 biopsy: Invasive mammary carcinoma, grade 2, ER 100%, PR 70%, Ki-67 25%, HER2 FISH positive - Left breast 2:00 biopsy: Invasive ductal carcinoma, grade 2, ER 100%, PR 70%, Ki-67 25%, HER2 negative - Left axillary lymph node biopsy: IDC, ER 100%, PR 50%, HER2 negative, E-cadherin positive - Bilateral breast MRI on 10/09/2021: Multiple enhancing masses with intervening non-mass enhancement involving most of the LOQ and part of the UOQ.  Abnormal enhancement spans approximately 12.4 x 6.4 x 7 cm.  Diffuse skin thickening/edema.  Multiple left axillary lymph nodes, largest node measuring approximately 2.4 cm in the lower axilla.  Indeterminate non-mass enhancement inferior to the biopsy site in the subareolar right breast measuring 1.5 x 0.9 x 0.8 cm. - Right breast biopsy UOQ on 10/01/2021: Fibroadenomatoid nodule with calcifications, fibrocystic changes including apocrine metaplasia with no malignancy.     Social/family history: - Traci Fuller lives at home with her husband and daughter.  Traci Fuller worked in an [employer] cabin [employer] office prior to retirement.  Current active smoker, half pack per day since age 70. - Mother had  cervical cancer and bladder cancer.  Maternal grandmother had ovarian cancer.  Brother died at age 39 from acute leukemia.  Another brother died of liver cancer.  Maternal half aunt had breast cancer.   PLAN:  Oligometastatic (two 7 mm lung lesions) HER2 positive left breast cancer: -We have reviewed PET scan from 10/22/2021: Large left breast mass and numerous hypermetabolic left axillary and subpectoral lymph nodes.  2 hypermetabolic left upper lobe lung nodules measuring 7 mm with SUV 4.2 and 3.2 respectively.  No other hypermetabolic lung nodules.  No distant metastatic disease.  Nonspecific  low-level subareolar activity in the right breast near the surgical clips and a single mildly hypermetabolic right axillary lymph node. - Echocardiogram shows EF 60 to 65%. - We also reviewed results of right breast biopsy which were benign. - Based on the lung nodules, Traci Fuller has oligometastatic disease.  The lung nodules are too small to get a biopsy. - I have recommended treatment with TCHP regimen like early stage breast cancer.  If Traci Fuller gets a good response with 6 cycles, will repeat PET scan.  We will also consider close follow-up on the lung lesions.  If the lung lesions are remaining, will consider SBRT.  We have also discussed that despite doing the aggressive therapy, Traci Fuller may eventually progress to full-blown metastatic disease. - Traci Fuller is agreeable to the aggressive treatment plan. - We have discussed TCHP regimen and side effects in detail. - We have sent for genetic testing, results are pending. - Traci Fuller already has port placed.  We will likely start her chemotherapy in 1 week.  Traci Fuller will also receive growth factor support in the form of Neulasta.  2.  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 - Continue gabapentin 300 mg 3 times daily.  Breast Cancer therapy associated bone loss: I have recommended calcium, Vitamin D and weight bearing  exercises.  Orders placed this encounter:  No orders of the defined types were placed in this encounter.   The patient has a good understanding of the overall plan. Traci Fuller agrees with it. Traci Fuller will call with any problems that may develop before the next visit here.  Derek Jack, MD [place] [phone]   I, Thana Ates, am acting as a scribe for Dr. Derek Jack.  I, Derek Jack MD, have reviewed the above documentation for accuracy and completeness, and I agree with the above.

## 2021-10-28 NOTE — Patient Instructions (Addendum)
[place] at [facility] ?Discharge Instructions ? ?You were seen and examined today by Dr. Delton Coombes.  ? ?Dr. Delton Coombes has reviewed your recent PET scan. You have been diagnosed with Stage IV breast cancer. This is because the breast cancer has spread beyond your breast and to your lung. There are only two spots in your lungs, so the recommendation is to treat it aggressively with an intent for cure. ? ?You will receive two chemotherapy drugs (Taxotere and Carboplatin) and two antibodies (Herceptin and Perjeta). The name of this regimen is TCHP. You will receive treatment here in the clinic once every 3 weeks for 6 cycles before we refer you back to Dr. Constance Haw for surgery evaluation. You will need the antibodies for a total of a year.  ? ?Prior to treatment, you will meet with our chemotherapy educator. We will get your chemotherapy medications approved through your insurance prior to the start of treatment. ? ?Follow-up with Dr. Delton Coombes one week after your first treatment to see how you tolerated treatment. ? ? ?Thank you for choosing [place] at [facility] to provide your oncology and hematology care.  To afford each patient quality time with our provider, please arrive at least 15 minutes before your scheduled appointment time.  ? ?If you have a lab appointment with the [facility] please come in thru the Main Entrance and check in at the main information desk. ? ?You need to re-schedule your appointment should you arrive 10 or more minutes late.  We strive to give you quality time with our providers, and arriving late affects you and other patients whose appointments are after yours.  Also, if you no show three or more times for appointments you may be dismissed from the clinic at the providers discretion.     ?Again, thank you for choosing [facility].  Our hope is that these requests will decrease the amount of time that you wait before  being seen by our physicians.       ?_____________________________________________________________ ? ?Should you have questions after your visit to [facility], please contact our office at [phone] and follow the prompts.  Our office hours are 8:00 a.m. and 4:30 p.m. Monday - Friday.  Please note that voicemails left after 4:00 p.m. may not be returned until the following business day.  We are closed weekends and major holidays.  You do have access to a nurse 24-7, just call the main number to the clinic [phone] and do not press any options, hold on the line and a nurse will answer the phone.   ? ?For prescription refill requests, have your pharmacy contact our office and allow 72 hours.   ? ?Due to Covid, you will need to wear a mask upon entering the hospital. If you do not have a mask, a mask will be given to you at the Main Entrance upon arrival. For doctor visits, patients may have 1 support person age 63 or older with them. For treatment visits, patients can not have anyone with them due to social distancing guidelines and our immunocompromised population.  ? ? ? ?

## 2021-10-29 ENCOUNTER — Encounter (HOSPITAL_COMMUNITY): Payer: Self-pay

## 2021-10-29 ENCOUNTER — Encounter (HOSPITAL_COMMUNITY): Payer: Self-pay | Admitting: General Surgery

## 2021-10-29 ENCOUNTER — Encounter (HOSPITAL_COMMUNITY): Payer: Self-pay | Admitting: Hematology

## 2021-10-29 DIAGNOSIS — Z95828 Presence of other vascular implants and grafts: Secondary | ICD-10-CM | POA: Insufficient documentation

## 2021-10-29 HISTORY — DX: Presence of other vascular implants and grafts: Z95.828

## 2021-10-29 MED ORDER — LIDOCAINE-PRILOCAINE 2.5-2.5 % EX CREA: TOPICAL_CREAM | CUTANEOUS | 3 refills | Status: DC

## 2021-10-29 MED ORDER — PROCHLORPERAZINE MALEATE 10 MG PO TABS: 10.0000 mg | ORAL_TABLET | Freq: Four times a day (QID) | ORAL | 1 refills | Status: DC | PRN

## 2021-10-29 NOTE — Progress Notes (Signed)
Written information provided on TCHP as discussed by Dr. Delton Coombes. ?

## 2021-10-29 NOTE — Patient Instructions (Addendum)
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 You will be treated in the clinic every 3 weeks with a combination of chemotherapy drugs and monoclonal antibodies. The drugs you will receive are docetaxel (Taxotere), carboplatin, trastuzumab (Herceptin), and pertuzumab (Perjeta).  We will stop the chemotherapy drugs after 6 cycles of treatment.  We will continue with the Herceptin and Perjeta to complete one year of treatment. The intent of treatment is cure. You will see the doctor regularly throughout treatment.  We will obtain blood work from you prior to every treatment and monitor your results to make sure it is safe to give your treatment. The doctor monitors your response to treatment by the way you are feeling, your blood work, and by obtaining scans periodically.  There will be wait times while you are here for treatment. It will take about 30 minutes to 1 hour for your lab work to result.  Then there will be wait times while pharmacy mixes your medications.    Medications you will receive in the clinic prior to your chemotherapy medications:  Aloxi:  ALOXI is used in adults to help prevent nausea and vomiting that happens with certain chemotherapy drugs.  Aloxi is a long acting medication, and will remain in your system for about two days.   Emend:  This is an anti-nausea medication that is used with Aloxi to help prevent nausea and vomiting caused by chemotherapy.  Dexamethasone:  This is a steroid given prior to chemotherapy to help prevent allergic reactions; it may also help prevent and control nausea and diarrhea.    Benadryl:  This is a histamine blocker (different from the Pepcid) that helps prevent allergic/infusion reactions to your chemotherapy. This medication may cause dizziness/drowsiness.  Tylenol:  Given prior to immunotherapy infusions to prevent infusion reactions such as fever/chills.    Docetaxel  (Taxotere)  About This Drug  Docetaxel is used to treat cancer. It is given in the vein (IV) through your port a cath.  It will take 1 hour to infuse. Your first infusion will take longer than 1 hour due to the fact that we start it at a very slow rate and gradually increase the rate until the maximum infusion rate is reached.  This is done in order to monitor you closely for allergic/infusion reactions.  Your nurse will remain in the room with you for the first 15 minutes of this infusion on your first time getting it.  If you tolerate the first infusion without adverse reactions, going forward we will give you this drug at the normal infusion rate over 1 hour.   Possible Side Effects  Bone marrow suppression. This is a decrease in the number of white blood cells, red blood cells, and platelets. This may raise your risk of infection, make you tired and weak (fatigue), and raise your risk of bleeding.   Fever in the setting of decreased white blood cells, which is a serious condition that can be lifethreatening   Soreness of the mouth and throat. You may have red areas, white patches, or sores that hurt.   Nausea and vomiting (throwing up)   Constipation (not able to move bowels)   Diarrhea (loose bowel movements)   Infections   Swelling of your legs, ankles and/or feet, or fluid build-up around your lungs, heart or elsewhere    Changes in the way food and drinks taste   Effects on the nerves are called peripheral neuropathy. You  may feel numbness, tingling, or pain in your hands and feet. It may be hard for you to button your clothes, open jars, or walk as usual. The effect on the nerves may get worse with more doses of the drug. These effects get better in some people after the drug is stopped but it does not get better in all people.   Decreased appetite (decreased hunger)   Weakness   Pain   Muscle pain/aching   Trouble breathing   Changes in your nail color, you may have  nail loss and/or brittle nail    Hair loss. Hair loss is often temporary, although there have been cases of permanent hair loss reported. Hair loss may happen suddenly or gradually. If you lose hair, you may lose it from your head, face, armpits, pubic area, chest, and/or legs. You may also notice your hair getting thin.   Allergic skin reaction. You may develop blisters on your skin that are filled with fluid or a severe red rash all over your body that may be painful.   Allergic reactions, including anaphylaxis are rare but may happen in some patients. Signs of allergic reaction to this drug may be swelling of the face, feeling like your tongue or throat are swelling, trouble breathing, rash, itching, fever, chills, feeling dizzy, and/or feeling that your heart is beating in a fast or not normal way. If this happens, do not take another dose of this drug. You should get urgent medical treatment.  Note: Not all possible side effects are included above.  Warnings and Precautions  Severe bone marrow suppression, including febrile neutropenia - fever in the setting of decreased white blood cells, which may be life threatening.   Severe allergic reactions, including anaphylaxis which can be life-threatening   Swelling (inflammation) in the colon in the setting of severely low white blood cells, which raises your risk of infection and can be life-threatening   Severe skin reactions, including redness, swelling or peeling of skin   Severe swelling in the eye or other changes in eyesight   Severe swelling of your legs, ankles and/or feet. Sometimes, fluid can build up in your lungs and/or around your heart causing you trouble breathing.   If you have a history of abnormal liver function, receive high doses of docetaxel, or have a history of lung cancer and have received treatment with a platinum (type of chemotherapy medication), you have an increased risk of death.   Severe weakness   This drug  may raise your risk of getting a second cancer such as leukemia and myelodysplastic syndrome.   Severe peripheral neuropathy - numbness, tingling, or pain in your hands and feet  This drug contains alcohol and may affect your central nervous system. The central nervous system is made up of your brain and spinal cord. You may feel drunk during and after your treatment and it can impair your ability to drive or use machinery for one to two hours after infusion.    Tumor lysis syndrome: This drug may act on the cancer cells very quickly. This may affect how your kidneys work.  Note: Some of the side effects above are very rare. If you have concerns and/or questions, please discuss them with your medical team.  Important Information  This drug may be present in the saliva, tears, sweat, urine, stool, vomit, semen, and vaginal secretions. Talk to your doctor and/or your nurse about the necessary precautions to take during this time.  Treating Side Effects  Manage tiredness by pacing your activities for the day.   Be sure to include periods of rest between energy-draining activities.   Get regular exercise. If you feel too tired to exercise vigorously, try taking a short walk.   To decrease the risk of infection, wash your hands regularly.   Avoid close contact with people who have a cold, the flu, or other infections.   Take your temperature as your doctor or nurse tells you, and whenever you feel like you may have a fever.   To help decrease the risk of bleeding, use a soft toothbrush. Check with your nurse before using dental floss.   Be very careful when using knives or tools.   Use an electric shaver instead of a razor.   Mouth care is very important and will help food taste better and improve your appetite. Your mouth care should consist of routine, gentle cleaning of your teeth or dentures and rinsing your mouth with a mixture of 1/2 teaspoon of salt in 8 ounces of water or 1/2  teaspoon of baking soda in 8 ounces of water. This should be done at least after each meal and at bedtime.   If you have mouth sores, avoid mouthwash that has alcohol. Also avoid alcohol and smoking because they can bother your mouth and throat.   Ask your doctor or nurse about medicines that are available to help stop or lessen constipation and/or diarrhea.   If you are not able to move your bowels, check with your doctor or nurse before you use enemas, laxatives, or suppositories.   Drink plenty of fluids (a minimum of eight glasses per day - 64 oz -  is recommended).   If you throw up or have loose bowel movements, you should drink more fluids so that you do not become dehydrated (lack of water in the body from losing too much fluid).   If you have diarrhea, eat low-fiber foods that are high in protein and calories and avoid foods that can irritate your digestive tracts or lead to cramping.   To help with nausea and vomiting, eat small, frequent meals instead of three large meals a day. Choose foods and drinks that are at room temperature. Ask your nurse or doctor about other helpful tips and medicine that is available to help stop or lessen these symptoms.   To help with decreased appetite, eat foods high in calories and protein, such as meat, poultry, fish, dry beans, tofu, eggs, nuts, milk, yogurt, cheese, ice cream, pudding, and nutritional supplements.   Consider using sauces and spices to increase taste. Daily exercise, with your doctors approval, may increase your appetite.   Keeping your pain under control is important to your well-being. Please tell your doctor or nurse if you are experiencing pain.   If you get a rash do not put anything on it unless your doctor or nurse says you may. Keep the area around the rash clean and dry. Ask your doctor for medicine if your rash bothers you.   Keeping your nails moisturized may help with brittleness.   To help with hair loss, wash with  a mild shampoo and avoid washing your hair every day.   Avoid rubbing your scalp, pat your hair or scalp dry.   Avoid coloring your hair.   Limit your use of hair spray, electric curlers, blow dryers, and curling irons.   If you are interested in getting a wig, talk to your nurse. You can also call  the [facility] at [phone] to find out information about the Look Good, Feel Better program close to where you live. It is a free program where women getting chemotherapy can learn about wigs, turbans and scarves as well as makeup techniques and skin and nail care.   If you have numbness and tingling in your hands and feet, be careful when cooking, walking, and handling sharp objects and hot liquids.  Food and Drug Interactions  There are no known interactions of docetaxel with food.   This drug may interact with other medicines. Tell your doctor and pharmacist about all the prescription and over-the-counter medicines and dietary supplements (vitamins, minerals, herbs and others) that you are taking at this time. Also, check with your doctor or pharmacist before starting any new prescription or over-the-counter medicines, or dietary supplements to make sure that there are no interactions.  When to Call the Doctor Call your doctor or nurse if you have any of these symptoms and/or any new or unusual symptoms:   Fever of 100.4 F (38 C) or higher   Chills   Blurred vision or other changes in eyesight   Easy bruising or bleeding   Wheezing or trouble breathing   Chest pain   Feeling dizzy or lightheaded   Tiredness that interferes with your daily activities   Pain in your mouth or throat that makes it hard to eat or drink   Nausea that stops you from eating or drinking and/or is not relieved by prescribed medicines   Throwing up   Lasting loss of appetite or rapid weight loss of five pounds in a week   Diarrhea, 4 times in one day or diarrhea with lack of  strength or a feeling of being dizzy   No bowel movement in 3 days or when you feel uncomfortable   Severe abdominal pain that does not go away   Blood in your stool   Numbness, tingling, or pain in your hands and feet   Swelling of legs, ankles, or feet   Weight gain of 5 pounds in one week (fluid retention)   Extreme weakness that interferes with normal activities   New rash and/or itching   Rash that is not relieved by prescribed medicines   Signs of inflammation/infection (redness, swelling, pain) of the tissue around your nails.   Signs of allergic reaction: swelling of the face, feeling like your tongue or throat are swelling, trouble breathing, rash, itching, fever, chills, feeling dizzy, and/or feeling that your heart is beating in a fast or not normal way. If this happens, call 911 for emergency care.   Flu-like symptoms: fever, headache, muscle and joint aches, and fatigue (low energy, feeling weak)   Signs of possible liver problems: dark urine, pale bowel movements, bad stomach pain, feeling very tired and weak, unusual itching, or yellowing of the eyes or skin   Symptoms of being drunk, confusion, or being very sleepy   Confusion or agitation, decreased urine, nausea/vomiting, diarrhea, muscle cramping, numbness and/or tingling, seizures   General pain that does not go away or is not relieved by prescribed medicine   If you think you may be pregnant or have impregnated your partner  Reproduction Warnings  Pregnancy warning: This drug can have harmful effects on the unborn baby. Women of childbearing potential should use effective methods of birth control during your cancer treatment and for 6 months after treatment. Men with female partners of childbearing potential should use effective methods of birth control  during your cancer treatment and for 3 months after your cancer treatment. Let your doctor know right away if you think you may be pregnant or may have  impregnated your partner.   Breastfeeding warning: Women should not breastfeed during treatment and for 1 week after treatment because this drug could enter the breast milk and cause harm to a breastfeeding baby.    Fertility warning: In men, this drug may affect your ability to have children in the future. Talk with your doctor or nurse if you plan to have children. Ask for information on sperm banking.   Carboplatin  About This Drug Carboplatin is used to treat cancer. It is given in the vein (IV) through your port a cath.  It will take 30 minutes to infuse.   Possible Side Effects  Bone marrow suppression. This is a decrease in the number of white blood cells, red blood cells, and platelets. This may raise your risk of infection, make you tired and weak (fatigue), and raise your risk of bleeding.   Nausea and vomiting (throwing up)   Weakness   Changes in your liver function   Changes in your kidney function   Electrolyte changes   Pain   Effects on the nerves are called peripheral neuropathy. You may feel numbness, tingling, or pain in your hands and feet. It may be hard for you to button your clothes, open jars, or walk as usual. The effect on the nerves may get worse with more doses of the drug. These effects get better in some people after the drug is stopped but it does not get better in all people.   Note: Not all possible side effects are included above.  Warnings and Precautions  Severe bone marrow suppression   Allergic reactions, including anaphylaxis are rare but may happen in some patients. Signs of allergic reaction to this drug may be swelling of the face, feeling like your tongue or throat are swelling, trouble breathing, rash, itching, fever, chills, feeling dizzy, and/or feeling that your heart is beating in a fast or not normal way. If this happens, do not take another dose of this drug. You should get urgent medical treatment.   Severe nausea and vomiting    Peripheral neuropathy - The risk is increased if you are over the age of 15 or if you have received other medicine with risk of peripheral neuropathy.   Blurred vision, loss of vision or other changes in eyesight   Decreased hearing   Skin and tissue irritation including redness, pain, warmth, or swelling at the IV site if the drug leaks out of the vein and into nearby tissue   Severe changes in your kidney function, which can cause kidney failure   Severe changes in your liver function, which can cause liver failure  Note: Some of the side effects above are very rare. If you have concerns and/or questions, please discuss them with your medical team.  Important Information  This drug may be present in the saliva, tears, sweat, urine, stool, vomit, semen, and vaginal secretions. Talk to your doctor and/or your nurse about the necessary precautions to take during this time.  Treating Side Effects  Manage tiredness by pacing your activities for the day.   Be sure to include periods of rest between energy-draining activities.   To decrease the risk of infection, wash your hands regularly.   Avoid close contact with people who have a cold, the flu, or other infections.   Take your  temperature as your doctor or nurse tells you, and whenever you feel like you may have a fever.   To help decrease the risk of bleeding, use a soft toothbrush. Check with your nurse before using dental floss.    Be very careful when using knives or tools.   Use an electric shaver instead of a razor.   Drink plenty of fluids (a minimum of eight glasses per day is recommended).   If you throw up or have loose bowel movements, you should drink more fluids so that you do not become dehydrated (lack of water in the body from losing too much fluid).   To help with nausea and vomiting, eat small, frequent meals instead of three large meals a day. Choose foods and drinks that are at room temperature. Ask your nurse  or doctor about other helpful tips and medicine that is available to help stop or lessen these symptoms.   If you have numbness and tingling in your hands and feet, be careful when cooking, walking, and handling sharp objects and hot liquids.   Keeping your pain under control is important to your well-being. Please tell your doctor or nurse if you are experiencing pain.  Food and Drug Interactions  There are no known interactions of carboplatin with food.   This drug may interact with other medicines. Tell your doctor and pharmacist about all the prescription and over-the-counter medicines and dietary supplements (vitamins, minerals, herbs and others) that you are taking at this time. Also, check with your doctor or pharmacist before starting any new prescription or over-the-counter medicines, or dietary supplements to make sure that there are no interactions.   When to Call the Doctor Call your doctor or nurse if you have any of these symptoms and/or any new or unusual symptoms:   Fever of 100.4 F (38 C) or higher   Chills   Tiredness that interferes with your daily activities   Feeling dizzy or lightheaded   Easy bleeding or bruising   Nausea that stops you from eating or drinking and/or is not relieved by prescribed medicines   Throwing up   Blurred vision or other changes in eyesight   Decrease in hearing or ringing in the ear   Signs of allergic reaction: swelling of the face, feeling like your tongue or throat are swelling, trouble breathing, rash, itching, fever, chills, feeling dizzy, and/or feeling that your heart is beating in a fast or not normal way. If this happens, call 911 for emergency care.   While you are getting this drug, please tell your nurse right away if you have any pain, redness, or swelling at the site of the IV infusion.   Signs of possible liver problems: dark urine, pale bowel movements, bad stomach pain, feeling very tired and weak, unusual  itching, or yellowing of the eyes or skin   Decreased urine, or very dark urine   Numbness, tingling, or pain in your hands and feet   Pain that does not go away or is not relieved by prescribed medicine   If you think you may be pregnant  Reproduction Warnings  Pregnancy warning: This drug may have harmful effects on the unborn baby. Women of childbearing potential should use effective methods of birth control during your cancer treatment. Let your doctor know right away if you think you may be pregnant.   Breastfeeding warning: It is not known if this drug passes into breast milk. For this reason, women should not breastfeed during  treatment because this drug could enter the breast milk and cause harm to a  breastfeeding baby.   Fertility warning: Human fertility studies have not been done with this drug. Talk with your doctor or nurse if you plan to have children. Ask for information on sperm or egg banking.   Pertuzumab (Perjeta)  About This Drug Pertuzumab is used to treat cancer. It is given in the vein (IV) through your port a cath.  The first infusion will be given over 1 hour.  Subsequent infusions will be given over 30 minutes.   Possible Side Effects  Bone marrow suppression. This is a decrease in the number of white blood cells, red blood cells, and platelets. This may raise your risk of infection, make you tired and weak (fatigue), and raise your risk of bleeding.   Nausea and vomiting (throwing up)   Diarrhea (loose bowel movements)   Not able to move bowels (constipation)   Tiredness   Headache   Muscle pain/aching   Effects on the nerves are called peripheral neuropathy. You may feel numbness, tingling, or pain in your hands and feet. It may be hard for you to button your clothes, open jars, or walk as usual. The effect on the nerves may get worse with more doses of the drug. These effects get better in some people after the drug is stopped but it does not get  better in all people.   Rash   Hair loss. Hair loss is often temporary, although with certain medicine, hair loss can sometimes be permanent. Hair loss may happen suddenly or gradually. If you lose hair, you may lose it from your head, face, armpits, pubic area, chest, and/or legs. You may also notice your hair getting thin.  Note: Each of the side effects above was reported in 30% or greater of patients treated with pertuzumab. Not all possible side effects are included above.  Warnings and Precautions  Congestive heart failure - your heart has less ability to pump blood properly. You may be short of breath. Your arms, hands, legs and feet may swell.    Allergic reactions, including anaphylaxis are rare but may happen in some patients. Signs of allergic reaction to this drug may be swelling of the face, feeling like your tongue or throat are swelling, trouble breathing, rash, itching, fever, chills, feeling dizzy, and/or feeling that your heart is beating in a fast or not normal way. If this happens, do not take another dose of this drug. You should get urgent medical treatment.   While you are getting this drug in your vein (IV), you may have a reaction to the drug. Sometimes you may be given medication to stop or lessen these side effects. Your nurse will check you closely for these signs: fever or shaking chills, flushing, facial swelling, feeling dizzy, headache, trouble breathing, rash, itching, chest tightness, or chest pain. These reactions may happen after your infusion. If this happens, call 911 for emergency care.  Note: Some of the side effects above are very rare. If you have concerns and/or questions, please discuss them with your medical team.  Important Information  This drug may be present in the saliva, tears, sweat, urine, stool, vomit, semen, and vaginal secretions. Talk to your doctor and/or your nurse about the necessary precautions to take during this time.  Treating Side  Effects  Manage tiredness by pacing your activities for the day.   Be sure to include periods of rest between energy-draining activities.  To decrease the risk of infection, wash your hands regularly.   Avoid close contact with people who have a cold, the flu, or other infections.   Take your temperature as your doctor or nurse tells you, and whenever you feel like you may have a fever.   To help decrease the risk of bleeding, use a soft toothbrush. Check with your nurse before using dental floss.   Be very careful when using knives or tools.   Use an electric shaver instead of a razor.   Drink plenty of fluids (a minimum of eight glasses per day is recommended).   To help with nausea and vomiting, eat small, frequent meals instead of three large meals a day. Choose foods and drinks that are at room temperature. Ask your nurse or doctor about other helpful tips and medicine that is available to help stop or lessen these symptoms.   If you throw up or have loose bowel movements, you should drink more fluids so that you do not become dehydrated (lack of water in the body from losing too much fluid).   If you have diarrhea, eat low-fiber foods that are high in protein and calories and avoid foods that can irritate your digestive tracts or lead to cramping.   Ask your nurse or doctor about medicine that can lessen or stop your diarrhea or constipation.   If you are not able to move your bowels, check with your doctor or nurse before you use enemas, laxatives, or suppositories.   If you get a rash do not put anything on it unless your doctor or nurse says you may. Keep the area around the rash clean and dry. Ask your doctor for medicine if your rash bothers you.   If you have numbness and tingling in your hands and feet, be careful when cooking, walking, and handling sharp objects and hot liquids.   Infusion reactions may occur after your infusion. If this happens, call 911 for emergency  care.   To help with hair loss, wash with a mild shampoo and avoid washing your hair every day.   Avoid rubbing your scalp, pat your hair or scalp dry.   Avoid coloring your hair.   Limit your use of hair spray, electric curlers, blow dryers, and curling irons.   If you are interested in getting a wig, talk to your nurse. You can also call the [facility] at [phone] to find out information about the Look Good, Feel Better program close to where you live. It is a free program where women getting chemotherapy can learn about wigs, turbans and scarves as well as makeup techniques and skin and nail care.   Keeping your pain under control is important to your well-being. Please tell your doctor or nurse if you are experiencing pain.   Get regular exercise. If you feel too tired to exercise vigorously, try taking a short walk.  Food and Drug Interactions  There are no known interactions of pertuzumab with food.   This drug may interact with other medicines. Tell your doctor and pharmacist about all the prescription and over-the-counter medicines and dietary supplements (vitamins, minerals, herbs and others) that you are taking at this time. Also, check with your doctor or pharmacist before starting any new prescription or over-the-counter medicines, or dietary supplements to make sure that there are no interactions.  When to Call the Doctor Call your doctor or nurse if you have any of these symptoms and/or any new or unusual  symptoms:   Fever of 100.4 F (38 C) or higher   Chills   Trouble breathing   Tiredness or weakness that interferes with your daily activities   Feeling dizzy or lightheaded   Easy bleeding or bruising   Swelling of arms, legs, ankles, or feet   Weight gain of 5 pounds in one week (fluid retention)   Headache that does not go away   Nausea that stops you from eating or drinking and/or is not relieved by prescribed medicines   Throwing  up   Diarrhea, 4 times in one day or diarrhea with lack of strength or a feeling of being dizzy   No bowel movement in 3 days or when you feel uncomfortable   A new rash or a rash that is not relieved by prescribed medicines   Numbness, tingling, or pain in your hands and feet   Signs of allergic reaction: swelling of the face, feeling like your tongue or throat are swelling, trouble breathing, rash, itching, fever, chills, feeling dizzy, and/or feeling that your heart is beating in a fast or not normal way. If this happens, call 911 for emergency care.   Signs of infusion reaction: fever or shaking chills, flushing, facial swelling, feeling dizzy, headache, trouble breathing, rash, itching, chest tightness, or chest pain. If this happens, call 911 for emergency care.   If you think you may be pregnant  Reproduction Warnings  Pregnancy warning: This drug can have harmful effects on the unborn baby. Women of childbearing potential should use effective methods of birth control during your cancer treatment and for 7 months after treatment. Let your doctor know right away if you think you may be pregnant.   Breastfeeding warning: It is not known if this drug passes into breast milk. For this reason, women should not breastfeed during treatment and for 7 months after treatment because this drug could enter the  breast milk and cause harm to a breastfeeding baby.   Fertility warning: Human fertility studies have not been done with this drug. Talk with your doctor or nurse if you plan to have children. Ask for information on sperm or egg banking.   Trastuzumab-xxxx (Herceptin, Dotsero, [place], [place], [place], [place])  About This Drug  Trastuzumab-xxxx is used to treat cancer. It is given in the vein (IV) through your port a cath.  The first infusion will be given over 90 minutes.  The second infusion will be given over 60 minutes.  The third infusion and all those after will be given  over 30 minutes.   Possible Side Effects  Bone marrow suppression. This is a decrease in the number of white blood cells, red blood cells, and platelets. This may raise your risk of infection, make you tired and weak (fatigue), and raise your risk of bleeding.   Congestive heart failure - your heart has less ability to pump blood properly   Soreness of the mouth and throat. You may have red areas, white patches, or sores that hurt.   Nausea   Diarrhea (loose bowel movements)   Fever   Chills   Tiredness   Infection   Inflammation of nasal passages and throat   Changes in the way food and drinks taste   Weight loss   Headache   Trouble sleeping   Cough   Upper respiratory infection   Rash  Note: Each of the side effects above was reported in 10% or greater of patients treated with trastuzumab-xxxx. Not all possible side  effects are included above.  Warnings and Precautions  Changes in the tissue of the heart and heart function. Some changes may happen that can cause your heart to have less ability to pump blood. This drug may also increase your risk of heart attack.    Serious and life-threatening lung problems such as inflammation (swelling) and scarring of the lungs which makes breathing difficult.   While you are getting this drug in your vein (IV), you may have a reaction to the drug. Sometimes you may be given medication to stop or lessen these side effects. Your nurse will check you closely for these signs: fever or shaking chills, flushing, facial swelling, feeling dizzy, headache, trouble breathing, rash, itching, chest tightness, or chest pain. These reactions may happen after your infusion. If this happens, call 911 for emergency care.   Severe decrease in the number of white blood cells, especially when receiving this drug in combination with other chemotherapy. This may raise your risk of infection which may be lifethreatening.  Note: Some of the side  effects above are very rare. If you have concerns and/or questions, please discuss them with your medical team.  Important Information  This drug may be present in the saliva, tears, sweat, urine, stool, vomit, semen, and vaginal secretions. Talk to your doctor and/or your nurse about the necessary precautions to take during this time.  Treating Side Effects  Manage tiredness by pacing your activities for the day.   Be sure to include periods of rest between energy-draining activities.   To decrease the risk of infection, wash your hands regularly.   Avoid close contact with people who have a cold, the flu, or other infections.   Take your temperature as your doctor or nurse tells you, and whenever you feel like you may have a fever.   To help decrease the risk of bleeding, use a soft toothbrush. Check with your nurse before using dental floss.   Be very careful when using knives or tools.   Use an electric shaver instead of a razor.   Drink plenty of fluids (a minimum of eight glasses per day is recommended).   To help with nausea, eat small, frequent meals instead of three large meals a day. Choose foods and drinks that are at room temperature. Ask your nurse or doctor about other helpful tips and medicine that is available to help stop or lessen these symptoms.   Mouth care is very important. Your mouth care should consist of routine, gentle cleaning of your teeth or dentures and rinsing your mouth with a mixture of 1/2 teaspoon of salt in 8 ounces of water or 1/2 teaspoon of baking soda in 8 ounces of water. This should be done at least after each meal and at bedtime.   Taking good care of your mouth may help food taste better and improve your appetite.   If you have mouth sores, avoid mouthwash that has alcohol. Also avoid alcohol and smoking because they can bother your mouth and throat.   If you throw up or have loose bowel movements, you should drink more fluids so that you do  not become dehydrated (lack of water in the body from losing too much fluid).   If you have diarrhea, eat low-fiber foods that are high in protein and calories and avoid foods that can irritate your digestive tracts or lead to cramping.   Ask your nurse or doctor about medicine that can lessen or stop your diarrhea.  To help with weight loss, drink fluids that contribute calories (whole milk, juice, soft drinks, sweetened beverages, milkshakes, and nutritional supplements) instead of water.   Include a source of protein at every meal and snack, such as meat, poultry, fish, dry beans, tofu, eggs, nuts, milk, yogurt, cheese, ice cream, pudding, and nutritional supplements.   If you get a rash do not put anything on it unless your doctor or nurse says you may. Keep the area around the rash clean and dry. Ask your doctor for medicine if your rash bothers you.   Keeping your pain under control is important to your well-being. Please tell your doctor or nurse if you are experiencing pain.   If you are having trouble sleeping, talk to your nurse or doctor on tips to help you sleep better.   Infusion reactions may occur after your infusion. If this happens, call 911 for emergency care.  Food and Drug Interactions  There are no known interactions of trastuzumab-xxxx with food.   This drug may interact with other medicines. Tell your doctor and pharmacist about all the prescription and over-the-counter medicines and dietary supplements (vitamins, minerals, herbs and others) that you are taking at this time. Also, check with your doctor or pharmacist before starting any new prescription or over-the-counter medicines, or dietary supplements to make sure that there are no interactions.  When to Call the Doctor Call your doctor or nurse if you have any of these symptoms and/or any new or unusual symptoms:   Fever of 100.4 F (38 C) or higher   Chills   Tiredness that interferes with your daily  activities   Trouble falling or staying asleep   Feeling dizzy or lightheaded   A headache that does not go away   Easy bleeding or bruising   Wheezing or trouble breathing or dry cough   Coughing up yellow, green, or bloody mucus   Feeling that your heart is beating in a fast or not normal way (palpitations)   Chest pain or symptoms of a heart attack. Most heart attacks involve pain in the center of the chest that lasts more than a few minutes. The pain may go away and come back, or it can be constant. It can feel like pressure, squeezing, fullness, or pain. Sometimes pain is felt in one or both arms, the back, neck, jaw, or stomach. If any of these symptoms last 2 minutes, call 911.   Pain in your mouth or throat that makes it hard to eat or drink   Nausea that stops you from eating or drinking and/or is not relieved by prescribed medicines   Diarrhea, 4 times in one day or diarrhea with lack of strength or a feeling of being dizzy   Lasting loss of appetite or rapid weight loss of five pounds in a week   Swelling of arms, hand, legs, and/or feet   Weight gain of 5 pounds in one week (fluid retention)   A new rash and/or itching that is not relieved by prescribed medicines   Signs of infusion reaction: fever or shaking chills, flushing, facial swelling, feeling dizzy, headache, trouble breathing, rash, itching, chest tightness, or chest pain. If this happens call 911 for emergency care.   If you think you may be pregnant  Reproduction Warnings  Pregnancy warning: This drug can have harmful effects on the unborn baby. Women of childbearing potential should use effective methods of birth control during your cancer treatment and for 7 months after treatment.  Let your doctor know right away if you think you may be pregnant during treatment or within 7 months of receiving treatment.   Breastfeeding warning: It is not known if this drug passes into breast milk. For this reason,  women should talk to their doctor about the risks and benefits of breastfeeding during treatment with this drug and for 7 months after treatment because this drug may enter the breast milk and cause harm to a breastfeeding baby.   Fertility warning: Human fertility studies have not been done with this drug. Talk with your doctor or nurse if you plan to have children. Ask for information on sperm or egg banking.   SELF CARE ACTIVITIES WHILE RECEIVING CHEMOTHERAPY:  Hydration Increase your fluid intake 48 hours prior to treatment and drink at least 8 to 12 cups (64 ounces) of water/decaffeinated beverages per day after treatment. You can still have your cup of coffee or soda but these beverages do not count as part of your 8 to 12 cups that you need to drink daily. No alcohol intake.  Medications Continue taking your normal prescription medication as prescribed.  If you start any new herbal or new supplements please let us know first to make sure it is safe.  Mouth Care Have teeth cleaned professionally before starting treatment. Keep dentures and partial plates clean. Use soft toothbrush and do not use mouthwashes that contain alcohol. Biotene is a good mouthwash that is available at most pharmacies or may be ordered by calling [phone]. Use warm salt water gargles (1 teaspoon salt per 1 quart warm water) before and after meals and at bedtime. If you need dental work, please let the doctor know before you go for your appointment so that we can coordinate the best possible time for you in regards to your chemo regimen. You need to also let your dentist know that you are actively taking chemo. We may need to do labs prior to your dental appointment.  Skin Care Always use sunscreen that has not expired and with SPF (Sun Protection Factor) of 50 or higher. Wear hats to protect your head from the sun. Remember to use sunscreen on your hands, ears, face, & feet.  Use good moisturizing lotions such as  udder cream, eucerin, or even Vaseline. Some chemotherapies can cause dry skin, color changes in your skin and nails.    Avoid long, hot showers or baths. Use gentle, fragrance-free soaps and laundry detergent. Use moisturizers, preferably creams or ointments rather than lotions because the thicker consistency is better at preventing skin dehydration. Apply the cream or ointment within 15 minutes of showering. Reapply moisturizer at night, and moisturize your hands every time after you wash them.  Hair Loss (if your doctor says your hair will fall out)  If your doctor says that your hair is likely to fall out, decide before you begin chemo whether you want to wear a wig. You may want to shop before treatment to match your hair color. Hats, turbans, and scarves can also camouflage hair loss, although some people prefer to leave their heads uncovered. If you go bare-headed outdoors, be sure to use sunscreen on your scalp. Cut your hair short. It eases the inconvenience of shedding lots of hair, but it also can reduce the emotional impact of watching your hair fall out. Don't perm or color your hair during chemotherapy. Those chemical treatments are already damaging to hair and can enhance hair loss. Once your chemo treatments are done and your  hair has grown back, it's OK to resume dyeing or perming hair.  With chemotherapy, hair loss is almost always temporary. But when it grows back, it may be a different color or texture. In older adults who still had hair color before chemotherapy, the new growth may be completely gray.  Often, new hair is very fine and soft.  Infection Prevention Please wash your hands for at least 30 seconds using warm soapy water. Handwashing is the #1 way to prevent the spread of germs. Stay away from sick people or people who are getting over a cold. If you develop respiratory systems such as green/yellow mucus production or productive cough or persistent cough let us know and  we will see if you need an antibiotic. It is a good idea to keep a pair of gloves on when going into grocery stores/[employer] to decrease your risk of coming into contact with germs on the carts, etc. Carry alcohol hand gel with you at all times and use it frequently if out in public. If your temperature reaches 100.5 or higher please call the clinic and let us know.  If it is after hours or on the weekend please go to the ER if your temperature is over 100.5.  Please have your own personal thermometer at home to use.    Sex and bodily fluids If you are going to have sex, a condom must be used to protect the person that isn't taking chemotherapy. Chemo can decrease your libido (sex drive). For a few days after chemotherapy, chemotherapy can be excreted through your bodily fluids.  When using the toilet please close the lid and flush the toilet twice.  Do this for a few day after you have had chemotherapy.   Effects of chemotherapy on your sex life Some changes are simple and won't last long. They won't affect your sex life permanently.  Sometimes you may feel: too tired not strong enough to be very active sick or sore  not in the mood anxious or low Your anxiety might not seem related to sex. For example, you may be worried about the cancer and how your treatment is going. Or you may be worried about money, or about how you family are coping with your illness.  These things can cause stress, which can affect your interest in sex. It's important to talk to your partner about how you feel.  Remember - the changes to your sex life don't usually last long. There's usually no medical reason to stop having sex during chemo. The drugs won't have any long term physical effects on your performance or enjoyment of sex. Cancer can't be passed on to your partner during sex  Contraception It's important to use reliable contraception during treatment. Avoid getting pregnant while you or your partner are having  chemotherapy. This is because the drugs may harm the baby. Sometimes chemotherapy drugs can leave a man or woman infertile.  This means you would not be able to have children in the future. You might want to talk to someone about permanent infertility. It can be very difficult to learn that you may no longer be able to have children. Some people find counselling helpful. There might be ways to preserve your fertility, although this is easier for men than for women. You may want to speak to a fertility expert. You can talk about sperm banking or harvesting your eggs. You can also ask about other fertility options, such as donor eggs. If you have or  have had breast cancer, your doctor might advise you not to take the contraceptive pill. This is because the hormones in it might affect the cancer. It is not known for sure whether or not chemotherapy drugs can be passed on through semen or secretions from the vagina. Because of this some doctors advise people to use a barrier method if you have sex during treatment. This applies to vaginal, anal or oral sex. Generally, doctors advise a barrier method only for the time you are actually having the treatment and for about a week after your treatment. Advice like this can be worrying, but this does not mean that you have to avoid being intimate with your partner. You can still have close contact with your partner and continue to enjoy sex.  Animals If you have cats or birds we just ask that you not change the litter or change the cage.  Please have someone else do this for you while you are on chemotherapy.   Food Safety During and After Cancer Treatment Food safety is important for people both during and after cancer treatment. Cancer and cancer treatments, such as chemotherapy, radiation therapy, and stem cell/bone marrow transplantation, often weaken the immune system. This makes it harder for your body to protect itself from foodborne illness, also called food  poisoning. Foodborne illness is caused by eating food that contains harmful bacteria, parasites, or viruses.  Foods to avoid Some foods have a higher risk of becoming tainted with bacteria. These include: Unwashed fresh fruit and vegetables, especially leafy vegetables that can hide dirt and other contaminants Raw sprouts, such as alfalfa sprouts Raw or undercooked beef, especially ground beef, or other raw or undercooked meat and poultry Fatty, fried, or spicy foods immediately before or after treatment.  These can sit heavy on your stomach and make you feel nauseous. Raw or undercooked shellfish, such as oysters. Sushi and sashimi, which often contain raw fish.  Unpasteurized beverages, such as unpasteurized fruit juices, raw milk, raw yogurt, or cider Undercooked eggs, such as soft boiled, over easy, and poached; raw, unpasteurized eggs; or foods made with raw egg, such as homemade raw cookie dough and homemade mayonnaise  Simple steps for food safety  Shop smart. Do not buy food stored or displayed in an unclean area. Do not buy bruised or damaged fruits or vegetables. Do not buy cans that have cracks, dents, or bulges. Pick up foods that can spoil at the end of your shopping trip and store them in a cooler on the way home.  Prepare and clean up foods carefully. Rinse all fresh fruits and vegetables under running water, and dry them with a clean towel or paper towel. Clean the top of cans before opening them. After preparing food, wash your hands for 20 seconds with hot water and soap. Pay special attention to areas between fingers and under nails. Clean your utensils and dishes with hot water and soap. Disinfect your kitchen and cutting boards using 1 teaspoon of liquid, unscented bleach mixed into 1 quart of water.    Dispose of old food. Eat canned and packaged food before its expiration date (the use by or best before date). Consume refrigerated leftovers within 3 to 4 days.  After that time, throw out the food. Even if the food does not smell or look spoiled, it still may be unsafe. Some bacteria, such as Listeria, can grow even on foods stored in the refrigerator if they are kept for too long.  Take precautions  when eating out. At restaurants, avoid buffets and salad bars where food sits out for a long time and comes in contact with many people. Food can become contaminated when someone with a virus, often a norovirus, or another bug handles it. Put any leftover food in a to-go container yourself, rather than having the server do it. And, refrigerate leftovers as soon as you get home. Choose restaurants that are clean and that are willing to prepare your food as you order it cooked.   AT HOME MEDICATIONS:                                                                                                                                                                Compazine/Prochlorperazine '10mg'$  tablet. Take 1 tablet every 6 hours as needed for nausea/vomiting. (This can make you sleepy)   EMLA cream. Apply a quarter size amount to port site 1 hour prior to chemo. Do not rub in. Cover with plastic wrap.    Diarrhea Sheet   If you are having loose stools/diarrhea, please purchase Imodium and begin taking as outlined:  At the first sign of poorly formed or loose stools you should begin taking Imodium (loperamide) 2 mg capsules. Take two tablets ('4mg'$ ) followed by one tablet ('2mg'$ ) every 2 hours - DO NOT EXCEED 8 tablets in 24 hours.  If it is bedtime and you are having loose stools, take 2 tablets at bedtime, then 2 tablets every 4 hours until morning.   Always call the [facility] if you are having loose stools/diarrhea that you can't get under control.  Loose stools/diarrhea leads to dehydration (loss of water) in your body.  We have other options of trying to get the loose stools/diarrhea to stop but you must let us know!   Constipation Sheet  Colace - 100  mg capsules - take 2 capsules daily.  If this doesn't help then you can increase to 2 capsules twice daily.  Please call if the above does not work for you. Do not go more than 2 days without a bowel movement.  It is very important that you do not become constipated.  It will make you feel sick to your stomach (nausea) and can cause abdominal pain and vomiting.  Nausea Sheet   Compazine/Prochlorperazine '10mg'$  tablet. Take 1 tablet every 6 hours as needed for nausea/vomiting (This can make you drowsy).  If you are having persistent nausea (nausea that does not stop) please call the [facility] and let us know the amount of nausea that you are experiencing.  If you begin to vomit, you need to call the [facility] and if it is the weekend and you have vomited more than one time and can't get it to stop-go to the Emergency Room.  Persistent nausea/vomiting can lead to dehydration (loss of fluid in your body) and will make you feel very weak and unwell. Ice chips, sips of clear liquids, foods that are at room temperature, crackers, and toast tend to be better tolerated.   SYMPTOMS TO REPORT AS SOON AS POSSIBLE AFTER TREATMENT:  FEVER GREATER THAN 100.4 F  CHILLS WITH OR WITHOUT FEVER  NAUSEA AND VOMITING THAT IS NOT CONTROLLED WITH YOUR NAUSEA MEDICATION  UNUSUAL SHORTNESS OF BREATH  UNUSUAL BRUISING OR BLEEDING  TENDERNESS IN MOUTH AND THROAT WITH OR WITHOUT PRESENCE OF ULCERS  URINARY PROBLEMS  BOWEL PROBLEMS  UNUSUAL RASH     Wear comfortable clothing and clothing appropriate for easy access to any Portacath or PICC line. Let us know if there is anything that we can do to make your therapy better!    What to do if you need assistance after hours or on the weekends: CALL [phone].  HOLD on the line, do not hang up.  You will hear multiple messages but at the end you will be connected with a nurse triage line.  They will contact the doctor if necessary.  Most of the time they  will be able to assist you.  Do not call the hospital operator.      I have been informed and understand all of the instructions given to me and have received a copy. I have been instructed to call the clinic [phone] or my family physician as soon as possible for continued medical care, if indicated. I do not have any more questions at this time but understand that I may call the [facility] or the Patient Navigator at [phone] during office hours should I have questions or need assistance in obtaining follow-up care.

## 2021-10-29 NOTE — Progress Notes (Signed)
The following biosimilar Ziextenzo (pegfilgrastim-bmez) has been selected for use in this patient. ? ?The following biosimilar Kanjinti (trastuzumab-anns) has been selected for use in this patient. ? ?Orders updated. ? ?Henreitta Leber, PharmD ? ?Pharmacist Chemotherapy Monitoring - Initial Assessment   ? ?Anticipated start date: 11/04/2021  ? ?The following has been reviewed per standard work regarding the patient's treatment regimen: ?The patient's diagnosis, treatment plan and drug doses, and organ/hematologic function ?Lab orders and baseline tests specific to treatment regimen  ?The treatment plan start date, drug sequencing, and pre-medications ?Prior authorization status  ?Patient's documented medication list, including drug-drug interaction screen and prescriptions for anti-emetics and supportive care specific to the treatment regimen ?The drug concentrations, fluid compatibility, administration routes, and timing of the medications to be used ?The patient's access for treatment and lifetime cumulative dose history, if applicable  ?The patient's medication allergies and previous infusion related reactions, if applicable  ? ?Changes made to treatment plan:  ?switch to insurance preferred biosimilar ? ?Follow up needed:  ?N/A ? ? ?Wynona Neat, [facility], ?10/29/2021  3:35 PM ? ?

## 2021-11-02 DIAGNOSIS — C50812 Malignant neoplasm of overlapping sites of left female breast: Secondary | ICD-10-CM

## 2021-11-02 NOTE — Research (Signed)
Trial: [id number] - TREATMENT OF REFRACTORY NAUSEA ?Patient Traci Fuller was identified by Dr Delton Coombes as a potential candidate for the above listed study.  This Clinical Research Nurse met with Traci Fuller, Traci Fuller, on 11/02/21 via phone to introduce the study.   A copy of the informed consent document and separate HIPAA Authorization was emailed to the patient; confirmed her email address listed in demographics.  Patient reads, speaks, and understands [place].   ?Patient was provided with the contact information of this Nurse and encouraged to contact the research team with any questions.  Approximately 15 minutes were spent with the patient reviewing the informed consent documents.  Patient was provided the option of taking informed consent documents home to review and was encouraged to review at their convenience with their support network, including other care providers. Patient took the consent documents home to review. ?Plan made with patient to meet after chemo ed tomorrow afternoon to sign consents and give her baseline questionnaires. ? ?Traci Skiff Teng Decou, RN, BSN, CHPN ?She  Her  Hers ?Clinical Research Nurse ?[place] ?Direct Dial [phone]  Pager [phone] ?11/02/2021 10:05 AM ?

## 2021-11-03 ENCOUNTER — Other Ambulatory Visit: Payer: Self-pay

## 2021-11-03 ENCOUNTER — Ambulatory Visit: Payer: Self-pay | Admitting: Licensed Clinical Social Worker

## 2021-11-03 ENCOUNTER — Encounter: Payer: Self-pay | Admitting: Licensed Clinical Social Worker

## 2021-11-03 ENCOUNTER — Telehealth: Payer: Self-pay | Admitting: Licensed Clinical Social Worker

## 2021-11-03 ENCOUNTER — Inpatient Hospital Stay (HOSPITAL_COMMUNITY): Payer: Managed Care, Other (non HMO)

## 2021-11-03 ENCOUNTER — Inpatient Hospital Stay: Payer: Managed Care, Other (non HMO)

## 2021-11-03 DIAGNOSIS — Z170 Estrogen receptor positive status [ER+]: Secondary | ICD-10-CM

## 2021-11-03 DIAGNOSIS — C50812 Malignant neoplasm of overlapping sites of left female breast: Secondary | ICD-10-CM

## 2021-11-03 DIAGNOSIS — C50919 Malignant neoplasm of unspecified site of unspecified female breast: Secondary | ICD-10-CM

## 2021-11-03 DIAGNOSIS — Z1379 Encounter for other screening for genetic and chromosomal anomalies: Secondary | ICD-10-CM | POA: Insufficient documentation

## 2021-11-03 DIAGNOSIS — Z95828 Presence of other vascular implants and grafts: Secondary | ICD-10-CM

## 2021-11-03 NOTE — Progress Notes (Addendum)
[id number] - TREATMENT OF REFRACTORY NAUSEA ? ?During consent visit, confirmed the following with the pt and/or Dr Delton Coombes: ? ?Pt has only taken 2 doses of Xanax. ?Lomotil has been ordered but not started; this is only to be used prn for diarrhea after chemo ?Pt has no personal hx cancer ?Pt is post-menopausal ?Pt is able read/write/understand English and has the ability to consent for herself. ?No evidence of impending bowel obstruction. ?No dementia, no uncontrolled DM/hyperglycemia, no known CNS disease or mets, no ESRD, no hepatic impairment. ?No known cardiac arrhythmia, uncontrolled CHF, hx MI ?No amifostine, no warfarin ?No known hypersensitivity to olanzapine or phenothiazines ? ?Traci Skiff Chryl Holten, RN, BSN, CHPN ?She  Her  Hers ?Clinical Research Nurse ?[place] ?Direct Dial [phone]  Pager [phone] ?11/04/2021 9:57 AM ? ? ?LATE ENTRY: ? ? ?This Nurse has reviewed this patient's inclusion and exclusion criteria and confirmed Traci Fuller is eligible for study participation.  Patient will continue with enrollment. ? ?Investigator agrees with enrollment. ? ?Traci Skiff. Jordany Russett, RN, BSN, CHPN ?She  Her  Hers ?Clinical Research Nurse ?[place] ?Direct Dial [phone]  Pager [phone] ?11/10/2021 4:25 PM ?

## 2021-11-03 NOTE — Research (Signed)
[facility] [address] Protocol Second Eligibility RN Review:  ? ?This Nurse has reviewed this patient's inclusion and exclusion criteria and confirmed SERYNA MAREK is eligible for study participation. The patient meets all inclusion criteria and does not meet any of the exclusion criteria. Patient will continue with enrollment. [name] is  post menopausal and has had a hysterectomy.   ?Jeral Fruit, RN ?11/03/21 ?4:04 PM ? ?

## 2021-11-03 NOTE — Progress Notes (Signed)
HPI:  Ms. Savard was previously seen in the [place] [facility] clinic due to a personal and family history of cancer and concerns regarding a hereditary predisposition to cancer. Please refer to our prior cancer genetics clinic note for more information regarding our discussion, assessment and recommendations, at the time. Ms. Goertz recent genetic test results were disclosed to her, as were recommendations warranted by these results. These results and recommendations are discussed in more detail below. ? ?CANCER HISTORY:  ?Oncology History  ?Breast cancer, left ([facility])  ?10/15/2021 Initial Diagnosis  ? Breast cancer, left [facility]) ?  ?11/04/2021 -  Chemotherapy  ? Patient is on Treatment Plan : BREAST  Docetaxel + Carboplatin + Trastuzumab + Pertuzumab  (TCHP) q21d   ?   ? Genetic Testing  ? Negative genetic testing. No pathogenic variants identified on the [facility] Multi-Cancer+RNA panel. The report date is 11/02/2021.  ? ?The Multi-Cancer Panel + RNA offered by [facility] includes sequencing and/or deletion duplication testing of the following 84 genes: AIP, ALK, APC, ATM, AXIN2,BAP1,  BARD1, BLM, BMPR1A, BRCA1, BRCA2, BRIP1, CASR, CDC73, CDH1, CDK4, CDKN1B, CDKN1C, CDKN2A (p14ARF), CDKN2A (p16INK4a), CEBPA, CHEK2, CTNNA1, DICER1, DIS3L2, EGFR (c.2369C>T, p.Thr790Met variant only), EPCAM (Deletion/duplication testing only), FH, FLCN, GATA2, GPC3, GREM1 (Promoter region deletion/duplication testing only), HOXB13 (c.251G>A, p.Gly84Glu), HRAS, KIT, MAX, MEN1, MET, MITF (c.952G>A, p.Glu318Lys variant only), MLH1, MSH2, MSH3, MSH6, MUTYH, NBN, NF1, NF2, NTHL1, PALB2, PDGFRA, PHOX2B, PMS2, POLD1, POLE, POT1, PRKAR1A, PTCH1, PTEN, RAD50, RAD51C, RAD51D, RB1, RECQL4, RET, RUNX1, SDHAF2, SDHA (sequence changes only), SDHB, SDHC, SDHD, SMAD4, SMARCA4, SMARCB1, SMARCE1, STK11, SUFU, TERC, TERT, TMEM127, TP53, TSC1, TSC2, VHL, WRN and WT1. ?  ? ? ?FAMILY HISTORY:  ?We obtained a detailed, 4-generation family history.   Significant diagnoses are listed below: ?Family History  ?Problem Relation Age of Onset  ? Bladder Cancer Mother 56  ? Cervical cancer Mother   ?     dx 27s, hysterectomy  ? Hypertension Father   ? Cirrhosis Father   ? Leukemia Brother 25  ? Liver cancer Brother 1  ? Breast cancer Maternal Aunt   ?     dx 6s  ? Cancer Maternal Uncle   ?     unk type d. 7  ? Cancer Paternal Aunt   ?     unk type  ? Breast cancer Maternal Grandmother   ?     dx 59s  ? Uterine cancer Maternal Grandmother   ? ?Ms. Muldrow has 1 daughter, 19. She has 3 grandchildren. She had 2 brothers, one died at 50 of leukemia and the other died at 13 of liver cancer.  ?  ?Ms. Mathies's mother died of bladder cancer at 81, she also had cervical cancer in her 45s and had a hysterectomy. Patient had 9 maternal half aunts/uncles. One half aunt had breast cancer in her 81s and is living at 87. A half uncle had cancer, unknown type. Maternal grandmother had breast and uterine cancer in her 27s and died at 70. No information about biological maternal grandfather. ?  ?Ms. Keltz's father died at 42 of cirrhosis. Patient had 9 paternal aunts/uncles. One aunt had cancer late in life, unsure type. Paternal grandparents passed in their 94s. ?  ?Ms. Moro is unaware of previous family history of genetic testing for hereditary cancer risks. Patient's maternal ancestors are of unknown descent, and paternal ancestors are of unknown descent. There is no reported Ashkenazi Jewish ancestry. There is no known consanguinity. ?  ? ? ?  GENETIC TEST RESULTS: Genetic testing reported out on 11/02/2021 through the [facility] Multi-Cancer+RNA cancer panel found no pathogenic mutations.  ? ?The Multi-Cancer Panel + RNA offered by [facility] includes sequencing and/or deletion duplication testing of the following 84 genes: AIP, ALK, APC, ATM, AXIN2,BAP1,  BARD1, BLM, BMPR1A, BRCA1, BRCA2, BRIP1, CASR, CDC73, CDH1, CDK4, CDKN1B, CDKN1C, CDKN2A (p14ARF), CDKN2A (p16INK4a),  CEBPA, CHEK2, CTNNA1, DICER1, DIS3L2, EGFR (c.2369C>T, p.Thr790Met variant only), EPCAM (Deletion/duplication testing only), FH, FLCN, GATA2, GPC3, GREM1 (Promoter region deletion/duplication testing only), HOXB13 (c.251G>A, p.Gly84Glu), HRAS, KIT, MAX, MEN1, MET, MITF (c.952G>A, p.Glu318Lys variant only), MLH1, MSH2, MSH3, MSH6, MUTYH, NBN, NF1, NF2, NTHL1, PALB2, PDGFRA, PHOX2B, PMS2, POLD1, POLE, POT1, PRKAR1A, PTCH1, PTEN, RAD50, RAD51C, RAD51D, RB1, RECQL4, RET, RUNX1, SDHAF2, SDHA (sequence changes only), SDHB, SDHC, SDHD, SMAD4, SMARCA4, SMARCB1, SMARCE1, STK11, SUFU, TERC, TERT, TMEM127, TP53, TSC1, TSC2, VHL, WRN and WT1. ? ?The test report has been scanned into EPIC and is located under the Molecular Pathology section of the Results Review tab.  A portion of the result report is included below for reference.  ? ? ?We discussed that because current genetic testing is not perfect, it is possible there may be a gene mutation in one of these genes that current testing cannot detect, but that chance is small.  There could be another gene that has not yet been discovered, or that we have not yet tested, that is responsible for the cancer diagnoses in the family. It is also possible there is a hereditary cause for the cancer in the family that Ms. Iturralde did not inherit and therefore was not identified in her testing.  Therefore, it is important to remain in touch with cancer genetics in the future so that we can continue to offer Ms. Jester the most up to date genetic testing.  ? ?ADDITIONAL GENETIC TESTING: We discussed with Ms. Mealor that her genetic testing was fairly extensive.  If there are genes identified to increase cancer risk that can be analyzed in the future, we would be happy to discuss and coordinate this testing at that time.   ? ?CANCER SCREENING RECOMMENDATIONS: Ms. Scalia test result is considered negative (normal).  This means that we have not identified a hereditary cause for her   personal and family history of cancer at this time. Most cancers happen by chance and this negative test suggests that her cancer may fall into this category.   ? ?While reassuring, this does not definitively rule out a hereditary predisposition to cancer. It is still possible that there could be genetic mutations that are undetectable by current technology. There could be genetic mutations in genes that have not been tested or identified to increase cancer risk.  Therefore, it is recommended she continue to follow the cancer management and screening guidelines provided by her oncology and primary healthcare provider.  ? ?An individual's cancer risk and medical management are not determined by genetic test results alone. Overall cancer risk assessment incorporates additional factors, including personal medical history, family history, and any available genetic information that may result in a personalized plan for cancer prevention and surveillance. ? ?RECOMMENDATIONS FOR FAMILY MEMBERS:  Relatives in this family might be at some increased risk of developing cancer, over the general population risk, simply due to the family history of cancer.  We recommended female relatives in this family have a yearly mammogram beginning at age 23, or 70 years younger than the earliest onset of cancer, an annual clinical breast exam, and perform monthly breast  self-exams. Female relatives in this family should also have a gynecological exam as recommended by their primary provider.  All family members should be referred for colonoscopy starting at age 42.  ? ? It is also possible there is a hereditary cause for the cancer in Ms. Highbaugh's family that she did not inherit and therefore was not identified in her.  Based on Ms. Myron's family history, we recommended her maternal relatives, especially her aunt who had breast cancer in her 83s,  have genetic counseling and testing. Ms. Christensen will let us know if we can be of any  assistance in coordinating genetic counseling and/or testing for these family members. ? ?FOLLOW-UP: Lastly, we discussed with Ms. Bader that cancer genetics is a rapidly advancing field and it is possible

## 2021-11-03 NOTE — Progress Notes (Signed)

## 2021-11-03 NOTE — Telephone Encounter (Signed)
Revealed negative genetic testing.  This normal result is reassuring and indicates that it is unlikely Traci Fuller's cancer is due to a hereditary cause.  It is unlikely that there is an increased risk of another cancer due to a mutation in one of these genes.  However, genetic testing is not perfect, and cannot definitively rule out a hereditary cause.  It will be important for her to keep in contact with genetics to learn if any additional testing may be needed in the future.    ? ? ?

## 2021-11-03 NOTE — Progress Notes (Signed)
Trial Name:  [id number] - TREATMENT OF REFRACTORY NAUSEA ? ?Patient Traci Fuller was identified by Dr Delton Coombes as a potential candidate for the above listed study.  This Clinical Research Nurse met with JERILYN GILLASPIE, [id number] on 11/03/21 in a manner and location that ensures patient privacy to discuss participation in the above listed research study.  Patient is Unaccompanied.  Patient was previously provided with informed consent documents.  Patient confirmed they have read the informed consent documents. ? ?As outlined in the informed consent form, this Nurse and [name] discussed the purpose of the research study, the investigational nature of the study, study procedures and requirements for study participation, potential risks and benefits of study participation, as well as alternatives to participation.  This study is blinded or double-blinded. The patient understands participation is voluntary and they may withdraw from study participation at any time.  Each study arm was reviewed, and randomization discussed.  Potential side effects were reviewed with patient as outlined in the consent form, and patient made aware there may be side effects not yet known. The chance of receiving placebo was discussed. Patient understands enrollment is pending full eligibility review.  ? ?Confidentiality and how the patient's information will be used as part of study participation were discussed.  Patient was informed there is not reimbursement provided for their time and effort spent on trial participation.  The patient is encouraged to discuss research study participation with their insurance provider to determine what costs they may incur as part of study participation, including research related injury.   ? ?All questions were answered to patient's satisfaction.  The informed consent and separate HIPAA Authorization was reviewed page by page.  The patient's mental and emotional status is  appropriate to provide informed consent, and the patient verbalizes an understanding of study participation.  Patient has agreed to participate in the above listed research study and has voluntarily signed the informed consent version active date 09/23/21 and separate HIPAA Authorization, version date 03/03/21  on 11/03/21 at 3:15PM.  The patient was provided with a copy of the signed informed consent form and separate HIPAA Authorization for their reference.  No study specific procedures were obtained prior to the signing of the informed consent document.  Approximately 30 minutes were spent with the patient reviewing the informed consent documents.  Patient was not requested to complete a Release of Information form. ? ?Marjie Skiff. Malaak Stach, RN, BSN, CHPN ?She  Her  Hers ?Clinical Research Nurse ?[place] ?Direct Dial [phone]  Pager [phone] ?11/03/2021 3:46 PM  ?

## 2021-11-04 ENCOUNTER — Inpatient Hospital Stay (HOSPITAL_COMMUNITY): Payer: Managed Care, Other (non HMO)

## 2021-11-04 ENCOUNTER — Encounter: Payer: Self-pay | Admitting: *Deleted

## 2021-11-04 ENCOUNTER — Encounter: Payer: Managed Care, Other (non HMO) | Admitting: Genetic Counselor

## 2021-11-04 ENCOUNTER — Encounter (HOSPITAL_COMMUNITY): Payer: Self-pay | Admitting: Hematology

## 2021-11-04 ENCOUNTER — Encounter (HOSPITAL_COMMUNITY): Payer: Self-pay | Admitting: Licensed Clinical Social Worker

## 2021-11-04 VITALS — BP 136/74 | HR 75 | Temp 99.0°F | Resp 18

## 2021-11-04 DIAGNOSIS — C50812 Malignant neoplasm of overlapping sites of left female breast: Secondary | ICD-10-CM

## 2021-11-04 DIAGNOSIS — Z95828 Presence of other vascular implants and grafts: Secondary | ICD-10-CM

## 2021-11-04 DIAGNOSIS — C50919 Malignant neoplasm of unspecified site of unspecified female breast: Secondary | ICD-10-CM

## 2021-11-04 LAB — CBC WITH DIFFERENTIAL/PLATELET
Abs Immature Granulocytes: 0.06 10*3/uL (ref 0.00–0.07)
Basophils Absolute: 0.1 10*3/uL (ref 0.0–0.1)
Basophils Relative: 1 %
Eosinophils Absolute: 0.3 10*3/uL (ref 0.0–0.5)
Eosinophils Relative: 3 %
HCT: 41.7 % (ref 36.0–46.0)
Hemoglobin: 13.1 g/dL (ref 12.0–15.0)
Immature Granulocytes: 1 %
Lymphocytes Relative: 14 %
Lymphs Abs: 1.1 10*3/uL (ref 0.7–4.0)
MCH: 29.9 pg (ref 26.0–34.0)
MCHC: 31.4 g/dL (ref 30.0–36.0)
MCV: 95.2 fL (ref 80.0–100.0)
Monocytes Absolute: 0.6 10*3/uL (ref 0.1–1.0)
Monocytes Relative: 7 %
Neutro Abs: 6.3 10*3/uL (ref 1.7–7.7)
Neutrophils Relative %: 74 %
Platelets: 270 10*3/uL (ref 150–400)
RBC: 4.38 MIL/uL (ref 3.87–5.11)
RDW: 13.7 % (ref 11.5–15.5)
WBC: 8.4 10*3/uL (ref 4.0–10.5)
nRBC: 0 % (ref 0.0–0.2)

## 2021-11-04 LAB — COMPREHENSIVE METABOLIC PANEL
ALT: 24 U/L (ref 0–44)
AST: 19 U/L (ref 15–41)
Albumin: 3.8 g/dL (ref 3.5–5.0)
Alkaline Phosphatase: 121 U/L (ref 38–126)
Anion gap: 8 (ref 5–15)
BUN: 26 mg/dL — ABNORMAL HIGH (ref 8–23)
CO2: 24 mmol/L (ref 22–32)
Calcium: 9.9 mg/dL (ref 8.9–10.3)
Chloride: 108 mmol/L (ref 98–111)
Creatinine, Ser: 1.38 mg/dL — ABNORMAL HIGH (ref 0.44–1.00)
GFR, Estimated: 43 mL/min — ABNORMAL LOW (ref >60)
Glucose, Bld: 132 mg/dL — ABNORMAL HIGH (ref 70–99)
Potassium: 3.5 mmol/L (ref 3.5–5.1)
Sodium: 140 mmol/L (ref 135–145)
Total Bilirubin: 0.8 mg/dL (ref 0.3–1.2)
Total Protein: 7 g/dL (ref 6.5–8.1)

## 2021-11-04 LAB — MAGNESIUM: Magnesium: 1.8 mg/dL (ref 1.7–2.4)

## 2021-11-04 MED ORDER — SODIUM CHLORIDE 0.9 % IV SOLN
150.0000 mg | Freq: Once | INTRAVENOUS | Status: AC
  Administered 2021-11-04: 150 mg via INTRAVENOUS
  Filled 2021-11-04: qty 150

## 2021-11-04 MED ORDER — ACETAMINOPHEN 325 MG PO TABS
650.0000 mg | ORAL_TABLET | Freq: Once | ORAL | Status: AC
  Administered 2021-11-04: 650 mg via ORAL
  Filled 2021-11-04: qty 2

## 2021-11-04 MED ORDER — SODIUM CHLORIDE 0.9 % IV SOLN
75.0000 mg/m2 | Freq: Once | INTRAVENOUS | Status: AC
  Administered 2021-11-04: 140 mg via INTRAVENOUS
  Filled 2021-11-04: qty 14

## 2021-11-04 MED ORDER — SODIUM CHLORIDE 0.9% FLUSH
10.0000 mL | INTRAVENOUS | Status: DC | PRN
  Administered 2021-11-04: 10 mL

## 2021-11-04 MED ORDER — SODIUM CHLORIDE 0.9 % IV SOLN
840.0000 mg | Freq: Once | INTRAVENOUS | Status: AC
  Administered 2021-11-04: 840 mg via INTRAVENOUS
  Filled 2021-11-04: qty 28

## 2021-11-04 MED ORDER — SODIUM CHLORIDE 0.9 % IV SOLN
10.0000 mg | Freq: Once | INTRAVENOUS | Status: AC
  Administered 2021-11-04: 10 mg via INTRAVENOUS
  Filled 2021-11-04: qty 10

## 2021-11-04 MED ORDER — SODIUM CHLORIDE 0.9 % IV SOLN: Freq: Once | INTRAVENOUS | Status: AC

## 2021-11-04 MED ORDER — TRASTUZUMAB-ANNS CHEMO 150 MG IV SOLR
8.0000 mg/kg | Freq: Once | INTRAVENOUS | Status: AC
  Administered 2021-11-04: 630 mg via INTRAVENOUS
  Filled 2021-11-04: qty 30

## 2021-11-04 MED ORDER — HEPARIN SOD (PORK) LOCK FLUSH 100 UNIT/ML IV SOLN
500.0000 [IU] | Freq: Once | INTRAVENOUS | Status: AC | PRN
  Administered 2021-11-04: 500 [IU]

## 2021-11-04 MED ORDER — SODIUM CHLORIDE 0.9 % IV SOLN
455.4000 mg | Freq: Once | INTRAVENOUS | Status: AC
  Administered 2021-11-04: 460 mg via INTRAVENOUS
  Filled 2021-11-04: qty 46

## 2021-11-04 MED ORDER — DIPHENHYDRAMINE HCL 50 MG PO CAPS
50.0000 mg | ORAL_CAPSULE | Freq: Once | ORAL | Status: AC
  Administered 2021-11-04: 50 mg via ORAL
  Filled 2021-11-04: qty 1

## 2021-11-04 MED ORDER — PALONOSETRON HCL INJECTION 0.25 MG/5ML
0.2500 mg | Freq: Once | INTRAVENOUS | Status: AC
  Administered 2021-11-04: 0.25 mg via INTRAVENOUS
  Filled 2021-11-04: qty 5

## 2021-11-04 NOTE — Progress Notes (Signed)
[facility] Clinical Social Work  ?Initial Assessment ? ? ?Traci Fuller is a 66 y.o. year old female presenting alone. Clinical Social Work was referred by nurse navigator for assessment of psychosocial needs.  ? ?SDOH (Social Determinants of Health) assessments performed: Yes ?  ?Distress Screen completed: No ?No flowsheet data found. ? ? ? ?Family/Social Information:  ?Housing Arrangement: patient lives with spouse, daughter and 3 adult grandchildren. ?Family members/support persons in your life? Family ?Transportation concerns: no  ?Employment: Quit going to work on his/her own. Income source: Supported by [facility] and [employer] ?Financial concerns:  At present pt does not have financial concerns, but is not yet eligible for Medicare and will have out of pocket responsibilities which she does not yet know the total cost of.  Pt to inform CSW if financial concerns arise. ?Type of concern: Medical bills ?Food access concerns: no ?Religious or spiritual practice: not identified ?Services Currently in place:  none ? ?Coping/ Adjustment to diagnosis: ?Patient understands treatment plan and what happens next? yes ?Concerns about diagnosis and/or treatment:  Pt is concerned about what financial responsibility she may have towards medical bills as well as some concern about how her grandchildren may react when she begins to lose her hair. ?Patient reported stressors:  Pt states she is well supported and is only worried about how her body will react to treatment. ?Hopes and priorities: to complete treatment and move forward with life ?Patient enjoys time with family/ friends ?Current coping skills/ strengths: Supportive family/friends  ? ? ? SUMMARY: ?Current SDOH Barriers:  ?Financial constraints related to medical bills ? ?Clinical Social Work Clinical Goal(s):  ?patient will work with SW to address concerns related to medical bills should they arise ? ?Interventions: ?Discussed common feeling and emotions when being  diagnosed with cancer, and the importance of support during treatment ?Informed patient of the support team roles and support services at [facility] ?Provided CSW contact information and encouraged patient to call with any questions or concerns ?Provided patient with information about [facility] to explore Medicare benefits and how to transition to Medicare as she will be eligible midway through treatment ? ? ?Follow Up Plan: CSW will follow-up with patient by phone as appropriate. ?Patient verbalizes understanding of plan: Yes ? ? ? ?Henriette Combs, LCSW ?

## 2021-11-04 NOTE — Patient Instructions (Signed)
[facility]  Discharge Instructions: ?Thank you for choosing [facility] to provide your oncology and hematology care.  ?If you have a lab appointment with the [facility], please come in thru the Main Entrance and check in at the main information desk. ? ?Wear comfortable clothing and clothing appropriate for easy access to any Portacath or PICC line.  ? ?We strive to give you quality time with your provider. You may need to reschedule your appointment if you arrive late (15 or more minutes).  Arriving late affects you and other patients whose appointments are after yours.  Also, if you miss three or more appointments without notifying the office, you may be dismissed from the clinic at the provider?s discretion.    ?  ?For prescription refill requests, have your pharmacy contact our office and allow 72 hours for refills to be completed.   ? ?Today you received the following chemotherapy and/or immunotherapy agents TCHP    ?  ?To help prevent nausea and vomiting after your treatment, we encourage you to take your nausea medication as directed. ? ?BELOW ARE SYMPTOMS THAT SHOULD BE REPORTED IMMEDIATELY: ?*FEVER GREATER THAN 100.4 F (38 ?C) OR HIGHER ?*CHILLS OR SWEATING ?*NAUSEA AND VOMITING THAT IS NOT CONTROLLED WITH YOUR NAUSEA MEDICATION ?*UNUSUAL SHORTNESS OF BREATH ?*UNUSUAL BRUISING OR BLEEDING ?*URINARY PROBLEMS (pain or burning when urinating, or frequent urination) ?*BOWEL PROBLEMS (unusual diarrhea, constipation, pain near the anus) ?TENDERNESS IN MOUTH AND THROAT WITH OR WITHOUT PRESENCE OF ULCERS (sore throat, sores in mouth, or a toothache) ?UNUSUAL RASH, SWELLING OR PAIN  ?UNUSUAL VAGINAL DISCHARGE OR ITCHING  ? ?Items with * indicate a potential emergency and should be followed up as soon as possible or go to the Emergency Department if any problems should occur. ? ?Please show the CHEMOTHERAPY ALERT CARD or IMMUNOTHERAPY ALERT CARD at check-in to the Emergency Department  and triage nurse. ? ?Should you have questions after your visit or need to cancel or reschedule your appointment, please contact [facility] [phone]  and follow the prompts.  Office hours are 8:00 a.m. to 4:30 p.m. Monday - Friday. Please note that voicemails left after 4:00 p.m. may not be returned until the following business day.  We are closed weekends and major holidays. You have access to a nurse at all times for urgent questions. Please call the main number to the clinic [phone] and follow the prompts. ? ?For any non-urgent questions, you may also contact your provider using MyChart. We now offer e-Visits for anyone 89 and older to request care online for non-urgent symptoms. For details visit [email or web address]. ?  ?Also download the MyChart app! Go to the app store, search "MyChart", open the app, select [facility], and log in with your MyChart username and password. ? ?Due to Covid, a mask is required upon entering the hospital/clinic. If you do not have a mask, one will be given to you upon arrival. For doctor visits, patients may have 1 support person aged 48 or older with them. For treatment visits, patients cannot have anyone with them due to current Covid guidelines and our immunocompromised population.  ?

## 2021-11-04 NOTE — Research (Signed)
[id number] - TREATMENT OF REFRACTORY NAUSEA ? ?11/04/21 Patient Traci Fuller presents to [facility] unaccompanied to begin Cycle 1 of chemotherapy.  Remaining Baseline activities for the [id number] trial were completed prior to treatment start. ? ?Labs - Blood specimen specific to the study were collected, and applicable specimen were placed on dry ice after collection.  Specimen were prepared for transport to via courier [facility] for processing: serum tube was placed upright in an ambient container, remaining tubes were transported upright, surrounded by dry ice.  Time of collection and placement on dry ice is documented on paper Research Lab slip. ? ?Questionnaires - Patient had completed the Baseline questionnaires at home yesterday (11/03/21) and those were collected and confirmed complete by this RN.  Cycle 1 questionnaires were provided to the patient, along with a postage paid return envelope, with attention to Larina Bras, Research RN.   ? ?Visit Planning - Patient denies any questions at this time, and verbalizes an understanding of the trial and how to complete and return questionnaires.  Patient informed that she will be contacted by Research RN via phone on Monday to follow up.  Patient was thanked for her participation in research.  Patient was provided with contact information of this RN as well as Larina Bras, RN and encouraged to call with any questions or concerns. ? ?Doreatha Martin, RN, BSN, [place] ?11/04/2021 8:29 AM ? ?

## 2021-11-04 NOTE — Progress Notes (Signed)
Patients port flushed without difficulty.  Good blood return noted with no bruising or swelling noted at site.  Stable during access and blood draw.  Patient to remain accessed for treatment. 

## 2021-11-04 NOTE — Progress Notes (Signed)
Patient presents today for TCHP infusion today per providers order.  Vital signs within parameters for treatment.  Labs pending.  Patient has no complaints at this time. ? ?Labs within parameters for treatment.  Per Dr. Delton Coombes patient to receive 500 cc Normal saline bolus over and hour. ? ?TCHP given today per MD orders.  Stable during infusion without adverse affects.  Vital signs stable.  No complaints at this time.  Discharge from clinic ambulatory in stable condition.  Alert and oriented X 3.  Follow up with [facility] as scheduled.  ?

## 2021-11-05 NOTE — Progress Notes (Signed)
24 hour call back 11/05/21.  Patient is doing well, did have some diarrhea but took imodium and it has subsided.  Patient states that she has mild numbness in the fingertip and a bad taste in her mouth but it has not effected here appetite.  No nausea, vomiting, or fever. Over all patient states that she is doing fine. ?

## 2021-11-06 ENCOUNTER — Inpatient Hospital Stay (HOSPITAL_COMMUNITY): Payer: Managed Care, Other (non HMO)

## 2021-11-06 ENCOUNTER — Other Ambulatory Visit: Payer: Self-pay

## 2021-11-06 VITALS — BP 134/69 | HR 63 | Temp 97.4°F | Resp 18

## 2021-11-06 DIAGNOSIS — Z170 Estrogen receptor positive status [ER+]: Secondary | ICD-10-CM

## 2021-11-06 DIAGNOSIS — Z95828 Presence of other vascular implants and grafts: Secondary | ICD-10-CM

## 2021-11-06 DIAGNOSIS — C50812 Malignant neoplasm of overlapping sites of left female breast: Secondary | ICD-10-CM | POA: Diagnosis not present

## 2021-11-06 MED ORDER — PEGFILGRASTIM-BMEZ 6 MG/0.6ML ~~LOC~~ SOSY
6.0000 mg | PREFILLED_SYRINGE | Freq: Once | SUBCUTANEOUS | Status: AC
  Administered 2021-11-06: 6 mg via SUBCUTANEOUS
  Filled 2021-11-06: qty 0.6

## 2021-11-06 NOTE — Progress Notes (Signed)
Traci Fuller presents today for injection per the provider's orders.  Ziextenzo administration without incident; injection site WNL; see MAR for injection details.  Patient tolerated procedure well and without incident.  No questions or complaints noted at this time.  ? ?Discharged from clinic ambulatory in stable condition. Alert and oriented x 3. F/U with [facility] as scheduled.   ?

## 2021-11-06 NOTE — Patient Instructions (Signed)
[facility]  Discharge Instructions: ?Thank you for choosing [facility] to provide your oncology and hematology care.  ?If you have a lab appointment with the [facility], please come in thru the Main Entrance and check in at the main information desk. ? ?Wear comfortable clothing and clothing appropriate for easy access to any Portacath or PICC line.  ? ?We strive to give you quality time with your provider. You may need to reschedule your appointment if you arrive late (15 or more minutes).  Arriving late affects you and other patients whose appointments are after yours.  Also, if you miss three or more appointments without notifying the office, you may be dismissed from the clinic at the provider?s discretion.    ?  ?For prescription refill requests, have your pharmacy contact our office and allow 72 hours for refills to be completed.   ? ?Today you received the Ziextenzo injection. ?  ? ? ?BELOW ARE SYMPTOMS THAT SHOULD BE REPORTED IMMEDIATELY: ?*FEVER GREATER THAN 100.4 F (38 ?C) OR HIGHER ?*CHILLS OR SWEATING ?*NAUSEA AND VOMITING THAT IS NOT CONTROLLED WITH YOUR NAUSEA MEDICATION ?*UNUSUAL SHORTNESS OF BREATH ?*UNUSUAL BRUISING OR BLEEDING ?*URINARY PROBLEMS (pain or burning when urinating, or frequent urination) ?*BOWEL PROBLEMS (unusual diarrhea, constipation, pain near the anus) ?TENDERNESS IN MOUTH AND THROAT WITH OR WITHOUT PRESENCE OF ULCERS (sore throat, sores in mouth, or a toothache) ?UNUSUAL RASH, SWELLING OR PAIN  ?UNUSUAL VAGINAL DISCHARGE OR ITCHING  ? ?Items with * indicate a potential emergency and should be followed up as soon as possible or go to the Emergency Department if any problems should occur. ? ?Please show the CHEMOTHERAPY ALERT CARD or IMMUNOTHERAPY ALERT CARD at check-in to the Emergency Department and triage nurse. ? ?Should you have questions after your visit or need to cancel or reschedule your appointment, please contact [facility]  [phone]  and follow the prompts.  Office hours are 8:00 a.m. to 4:30 p.m. Monday - Friday. Please note that voicemails left after 4:00 p.m. may not be returned until the following business day.  We are closed weekends and major holidays. You have access to a nurse at all times for urgent questions. Please call the main number to the clinic [phone] and follow the prompts. ? ?For any non-urgent questions, you may also contact your provider using MyChart. We now offer e-Visits for anyone 30 and older to request care online for non-urgent symptoms. For details visit [email or web address]. ?  ?Also download the MyChart app! Go to the app store, search "MyChart", open the app, select [facility], and log in with your MyChart username and password. ? ?Due to Covid, a mask is required upon entering the hospital/clinic. If you do not have a mask, one will be given to you upon arrival. For doctor visits, patients may have 1 support person aged 67 or older with them. For treatment visits, patients cannot have anyone with them due to current Covid guidelines and our immunocompromised population.  ?

## 2021-11-09 DIAGNOSIS — C50812 Malignant neoplasm of overlapping sites of left female breast: Secondary | ICD-10-CM

## 2021-11-09 NOTE — Research (Signed)
[id number] - TREATMENT OF REFRACTORY NAUSEA ? ?Called Traci Fuller for her C1D4 call (fell over the weekend, so called today, Monday). She reports no nausea or vomiting so will not continue to the second part of the study. She will mail the questionnaires back in the envelope we gave to her last week. ? ?When we spoke, Traci Fuller did report concerning head and body aches, rated 8/10, with the headache described as "squeezing." She has taken acetaminophen with minimal response and has been using her Xanax to sleep through the pain. She is also having mouth pain, with food tasting "awful" and NO white tongue. I notified Dr Delton Coombes and oncology navigator Lilia Pro of the patient's report. ? ?Marjie Skiff Bryndon Cumbie, RN, BSN, CHPN ?She  Her  [place] ?Clinical Research Nurse ?[place] ?Direct Dial [phone]  Pager [phone] ?11/09/2021 10:43 AM ?

## 2021-11-10 ENCOUNTER — Other Ambulatory Visit (HOSPITAL_COMMUNITY): Payer: Self-pay | Admitting: *Deleted

## 2021-11-10 MED ORDER — LIDOCAINE VISCOUS HCL 2 % MT SOLN: 15.0000 mL | Freq: Four times a day (QID) | OROMUCOSAL | 0 refills | Status: DC | PRN

## 2021-11-10 MED ORDER — FLUCONAZOLE 100 MG PO TABS: ORAL_TABLET | ORAL | 0 refills | Status: DC

## 2021-11-10 MED ORDER — SUCRALFATE 1 GM/10ML PO SUSP: ORAL | 0 refills | Status: DC

## 2021-11-10 NOTE — Progress Notes (Signed)
Followed up with patient per request of Dr. Delton Coombes to assess ongoing headaches and any additional symptoms she may be having.  States that headaches are still severe at times without relief from tylenol. In addition to headaches, she has developed white patches on tongue along with painful oral cavity.  Diflucan 100 mg #6 with instructions sent to [facility] on [address].  Also sent Carafate and 2% lidocaine to mix 1:1 and swish and swallow for mouth pain, per standing orders.  Patient verbalized understanding and has a symptom management appointment with Tarri Abernethy - [facility] tomorrow.  No other complaints noted. ?

## 2021-11-11 ENCOUNTER — Inpatient Hospital Stay (HOSPITAL_BASED_OUTPATIENT_CLINIC_OR_DEPARTMENT_OTHER): Payer: Managed Care, Other (non HMO) | Admitting: Physician Assistant

## 2021-11-11 ENCOUNTER — Other Ambulatory Visit (HOSPITAL_COMMUNITY): Payer: Self-pay | Admitting: *Deleted

## 2021-11-11 ENCOUNTER — Inpatient Hospital Stay (HOSPITAL_COMMUNITY): Payer: Managed Care, Other (non HMO)

## 2021-11-11 ENCOUNTER — Other Ambulatory Visit: Payer: Self-pay

## 2021-11-11 VITALS — BP 136/82 | HR 86 | Temp 100.0°F | Resp 18 | Ht 61.42 in | Wt 165.6 lb

## 2021-11-11 DIAGNOSIS — Z09 Encounter for follow-up examination after completed treatment for conditions other than malignant neoplasm: Secondary | ICD-10-CM

## 2021-11-11 DIAGNOSIS — C50812 Malignant neoplasm of overlapping sites of left female breast: Secondary | ICD-10-CM

## 2021-11-11 DIAGNOSIS — E860 Dehydration: Secondary | ICD-10-CM

## 2021-11-11 DIAGNOSIS — K1231 Oral mucositis (ulcerative) due to antineoplastic therapy: Secondary | ICD-10-CM | POA: Diagnosis not present

## 2021-11-11 LAB — COMPREHENSIVE METABOLIC PANEL
ALT: 32 U/L (ref 0–44)
AST: 25 U/L (ref 15–41)
Albumin: 3.8 g/dL (ref 3.5–5.0)
Alkaline Phosphatase: 159 U/L — ABNORMAL HIGH (ref 38–126)
Anion gap: 7 (ref 5–15)
BUN: 21 mg/dL (ref 8–23)
CO2: 23 mmol/L (ref 22–32)
Calcium: 9.8 mg/dL (ref 8.9–10.3)
Chloride: 107 mmol/L (ref 98–111)
Creatinine, Ser: 1.44 mg/dL — ABNORMAL HIGH (ref 0.44–1.00)
GFR, Estimated: 41 mL/min — ABNORMAL LOW (ref >60)
Glucose, Bld: 112 mg/dL — ABNORMAL HIGH (ref 70–99)
Potassium: 4.3 mmol/L (ref 3.5–5.1)
Sodium: 137 mmol/L (ref 135–145)
Total Bilirubin: 0.5 mg/dL (ref 0.3–1.2)
Total Protein: 7.1 g/dL (ref 6.5–8.1)

## 2021-11-11 LAB — CBC WITH DIFFERENTIAL/PLATELET
Band Neutrophils: 10 %
Basophils Absolute: 0 10*3/uL (ref 0.0–0.1)
Basophils Relative: 0 %
Eosinophils Absolute: 0.3 10*3/uL (ref 0.0–0.5)
Eosinophils Relative: 2 %
HCT: 38.2 % (ref 36.0–46.0)
Hemoglobin: 12.5 g/dL (ref 12.0–15.0)
Lymphocytes Relative: 14 %
Lymphs Abs: 1.9 10*3/uL (ref 0.7–4.0)
MCH: 31.3 pg (ref 26.0–34.0)
MCHC: 32.7 g/dL (ref 30.0–36.0)
MCV: 95.5 fL (ref 80.0–100.0)
Metamyelocytes Relative: 5 %
Monocytes Absolute: 1.2 10*3/uL — ABNORMAL HIGH (ref 0.1–1.0)
Monocytes Relative: 9 %
Myelocytes: 7 %
Neutro Abs: 8.4 10*3/uL — ABNORMAL HIGH (ref 1.7–7.7)
Neutrophils Relative %: 53 %
Platelets: 206 10*3/uL (ref 150–400)
RBC: 4 MIL/uL (ref 3.87–5.11)
RDW: 13.9 % (ref 11.5–15.5)
WBC: 13.4 10*3/uL — ABNORMAL HIGH (ref 4.0–10.5)
nRBC: 0 % (ref 0.0–0.2)

## 2021-11-11 LAB — MAGNESIUM: Magnesium: 1.5 mg/dL — ABNORMAL LOW (ref 1.7–2.4)

## 2021-11-11 MED ORDER — MAGNESIUM SULFATE 2 GM/50ML IV SOLN
2.0000 g | Freq: Once | INTRAVENOUS | Status: AC
  Administered 2021-11-11: 2 g via INTRAVENOUS

## 2021-11-11 MED ORDER — HEPARIN SOD (PORK) LOCK FLUSH 100 UNIT/ML IV SOLN
500.0000 [IU] | Freq: Once | INTRAVENOUS | Status: AC
  Administered 2021-11-11: 500 [IU] via INTRAVENOUS

## 2021-11-11 MED ORDER — SODIUM CHLORIDE 0.9 % IV SOLN: INTRAVENOUS | Status: DC

## 2021-11-11 MED ORDER — SODIUM CHLORIDE 0.9% FLUSH
10.0000 mL | Freq: Once | INTRAVENOUS | Status: AC
Start: 2021-11-11 — End: 2021-11-11
  Administered 2021-11-11: 10 mL via INTRAVENOUS

## 2021-11-11 NOTE — Patient Instructions (Addendum)
[facility] at [facility] ?Discharge Instructions ? ?You were seen today by Tarri Abernethy PA-C to follow-up on your symptoms after your first cycle of chemotherapy. ? ?DEHYDRATION: You were given IV fluids during your visit today.  Try to drink at least 64 ounces of water daily.   ? ?HEADACHE & BODY ACHES: This is a common side effect after chemotherapy and your blood stimulating injection (Ziextenzo).  These aches should get better in the next few days.  Please let us know if they are not improving or get any worse. ? ?MOUTH SORES: This is called "mucositis," which is inflammation in the lining of your mouth and throat.  It can sometimes be caused by the type of chemotherapy you are receiving.  Continue to use the mouthwash (Carafate/lidocaine) for treatment of mouth pain.  Take Diflucan tablets to treat possible yeast infection of your mouth sores.  Please call our clinic if this does not improve or if it gets any worse.  It is also important that you do not smoke or drink any alcohol while you have these sores. ?**To help prevent mouth sores during your next cycle of chemotherapy, you can make a mouthwash at home using 1 teaspoon of salt + 1 teaspoon baking soda + 1 quart of water that you swish and rinse in your mouth every 2-3 hours while awake during the week after your next chemotherapy treatment. ? ?WEIGHT LOSS: Since you have not been able to eat enough food due to your mouth sores, I recommend that you drink Ensure or Boost beverages x3 each day until you are able to tolerate solid food again. ? ?** CALL THE CLINIC ON MONDAY IF... ?Your headaches and body aches have not gotten better. ?Your mouth sores have not improved. ?You are not able to drink 64 ounces of water daily. ?You are having more than 1 episode of diarrhea daily. ?You have any new or worsening symptoms ?**WE CAN SCHEDULE YOU FOR ADDITIONAL SYMPTOM MANAGEMENT VISITS IF NEEDED BASED ON HOW YOU ARE  FEELING. ? ?FOLLOW-UP APPOINTMENT: You are scheduled for your next follow-up visit on 11/25/2021 with repeat labs, visit with Dr. Delton Coombes, and your next cycle of chemotherapy. ? ? ?Thank you for choosing [facility] at [facility] to provide your oncology and hematology care.  To afford each patient quality time with our provider, please arrive at least 15 minutes before your scheduled appointment time.  ? ?If you have a lab appointment with the [facility] please come in thru the Main Entrance and check in at the main information desk. ? ?You need to re-schedule your appointment should you arrive 10 or more minutes late.  We strive to give you quality time with our providers, and arriving late affects you and other patients whose appointments are after yours.  Also, if you no show three or more times for appointments you may be dismissed from the clinic at the providers discretion.     ?Again, thank you for choosing [facility].  Our hope is that these requests will decrease the amount of time that you wait before being seen by our physicians.       ?_____________________________________________________________ ? ?Should you have questions after your visit to [facility], please contact our office at [phone] and follow the prompts.  Our office hours are 8:00 a.m. and 4:30 p.m. Monday - Friday.  Please note that voicemails left after 4:00 p.m. may not be  returned until the following business day.  We are closed weekends and major holidays.  You do have access to a nurse 24-7, just call the main number to the clinic [phone] and do not press any options, hold on the line and a nurse will answer the phone.   ? ?For prescription refill requests, have your pharmacy contact our office and allow 72 hours.   ? ?Due to Covid, you will need to wear a mask upon entering the hospital. If you do not have a mask, a mask will be given to you at the Main Entrance upon  arrival. For doctor visits, patients may have 1 support person age 45 or older with them. For treatment visits, patients can not have anyone with them due to social distancing guidelines and our immunocompromised population.  ? ? ? ?

## 2021-11-11 NOTE — Patient Instructions (Signed)
[facility]  Discharge Instructions: Thank you for choosing [facility] to provide your oncology and hematology care.  If you have a lab appointment with the Cancer Center, please come in thru the Main Entrance and check in at the main information desk.  Wear comfortable clothing and clothing appropriate for easy access to any Portacath or PICC line.   We strive to give you quality time with your provider. You may need to reschedule your appointment if you arrive late (15 or more minutes).  Arriving late affects you and other patients whose appointments are after yours.  Also, if you miss three or more appointments without notifying the office, you may be dismissed from the clinic at the provider's discretion.      For prescription refill requests, have your pharmacy contact our office and allow 72 hours for refills to be completed.    Today you received the following chemotherapy and/or immunotherapy agents PORT flush labs      To help prevent nausea and vomiting after your treatment, we encourage you to take your nausea medication as directed.  BELOW ARE SYMPTOMS THAT SHOULD BE REPORTED IMMEDIATELY: *FEVER GREATER THAN 100.4 F (38 C) OR HIGHER *CHILLS OR SWEATING *NAUSEA AND VOMITING THAT IS NOT CONTROLLED WITH YOUR NAUSEA MEDICATION *UNUSUAL SHORTNESS OF BREATH *UNUSUAL BRUISING OR BLEEDING *URINARY PROBLEMS (pain or burning when urinating, or frequent urination) *BOWEL PROBLEMS (unusual diarrhea, constipation, pain near the anus) TENDERNESS IN MOUTH AND THROAT WITH OR WITHOUT PRESENCE OF ULCERS (sore throat, sores in mouth, or a toothache) UNUSUAL RASH, SWELLING OR PAIN  UNUSUAL VAGINAL DISCHARGE OR ITCHING   Items with * indicate a potential emergency and should be followed up as soon as possible or go to the Emergency Department if any problems should occur.  Please show the CHEMOTHERAPY ALERT CARD or IMMUNOTHERAPY ALERT CARD at check-in to the Emergency  Department and triage nurse.  Should you have questions after your visit or need to cancel or reschedule your appointment, please contact [facility] [phone]  and follow the prompts.  Office hours are 8:00 a.m. to 4:30 p.m. Monday - Friday. Please note that voicemails left after 4:00 p.m. may not be returned until the following business day.  We are closed weekends and major holidays. You have access to a nurse at all times for urgent questions. Please call the main number to the clinic [phone] and follow the prompts.  For any non-urgent questions, you may also contact your provider using MyChart. We now offer e-Visits for anyone 18 and older to request care online for non-urgent symptoms. For details visit [email or web address].   Also download the MyChart app! Go to the app store, search "MyChart", open the app, select [place], and log in with your MyChart username and password.  Due to Covid, a mask is required upon entering the hospital/clinic. If you do not have a mask, one will be given to you upon arrival. For doctor visits, patients may have 1 support person aged 18 or older with them. For treatment visits, patients cannot have anyone with them due to current Covid guidelines and our immunocompromised population.  

## 2021-11-11 NOTE — Progress Notes (Signed)
Patients port flushed without difficulty.  Good blood return noted with no bruising or swelling noted at site.  Band aid applied.  VSS with discharge and left in satisfactory condition with no s/s of distress noted.   

## 2021-11-11 NOTE — Progress Notes (Signed)
[facility] ?[address] ?[place], [place] [address] ?Phone: [phone] ?Fax: [phone] ? ?SYMPTOMS MANAGEMENT CLINIC PROGRESS NOTE  ? ?Traci Fuller ?[id number] ?Mar 23, 1956 ?66 y.o. ? ?Traci Fuller is managed by Dr. 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 ? ?Actively treated with chemotherapy/immunotherapy/hormonal therapy: YES ? ?Current therapy: TCHP (docetaxel + carboplatin + trastuzumab + pertuzumab) every 21 days ? ?Last treated: 11/04/2021 ? ?Next scheduled appointment with provider: 11/25/2021 ? ?Subjective:  ?Chief Complaint: Chemotherapy follow-up and symptom management ? ?Traci Fuller [phone] year old female) managed by Dr. 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  She was started on [facility] with her first dose of carboplatin/docetaxel/pertuzumab/trastuzumab given on 11/04/2021.  She received Ziextenzo on 11/06/2021. ? ?She  reports that she  tolerated her  chemotherapy infusions very well during the infusions, but had onset of fatigue, generalized body pains, and other symptoms described below.  Since that time, she  has been feeling poorly.  ? ?She reports significant fatigue with energy about 25%. ?She had onset of mouth sores after chemotherapy, and has been unable to tolerate solid food.  She reports that even water is hurting her mouth "like razor blades," and she is drinking less than 32 ounces of water daily.  She called the nurse line yesterday and was given prescription for Diflucan 100 mg x 6 doses plus Carafate/lidocaine mouthwash.  Patient reports that her mouth pain has slightly improved with these interventions.  Due to her decreased ability to tolerate oral intake, she has lost 6 pounds in the past week. ?She has had mild diarrhea, with 1 watery bowel movement daily.  She denies any nausea or vomiting. ?She reports some redness and  irritated skin around the edges of her mouth and reports that her skin overall feels dry. ?She admits to taste change. ?After receiving her Ziextenzo injection on Friday, she had onset of generalized body aches and headache, which was not relieved with Tylenol.  She has not had any neurologic deficits associated with headache.  She reports that her pain has started to ease up today. ?She has had a mild neuropathy in her fingers described as pins and needle sensation that comes and goes. ? ?She denies any changes in her urine output. ?She has not noted any hair loss. ?She denies any peripheral edema.  She has not had any chest pain, shortness of breath, or dyspnea on exertion. ?She denies any fevers, chills, or night sweats. ?She has not had any abnormal bruising or bleeding. ? ? ?Review of Systems: ?Review of Systems  ?Constitutional:  Positive for activity change, appetite change and fatigue. Negative for chills, diaphoresis, fever and unexpected weight change.  ?HENT:  Positive for mouth sores and trouble swallowing. Negative for nosebleeds and sore throat.   ?Respiratory:  Negative for cough and shortness of breath.   ?Cardiovascular:  Negative for chest pain, palpitations and leg swelling.  ?Gastrointestinal:  Positive for diarrhea. Negative for abdominal pain, blood in stool, constipation, nausea and vomiting.  ?Genitourinary:  Negative for dysuria and hematuria.  ?Musculoskeletal:  Positive for arthralgias and myalgias.  ?Neurological:  Positive for numbness and headaches. Negative for dizziness and light-headedness.  ?Psychiatric/Behavioral:  Positive for sleep disturbance. Negative for dysphoric mood. The patient is not nervous/anxious.   ? ? ?Past Medical History, Surgical history, Social history, and Family history were reviewed as documented elsewhere in chart, and were updated as appropriate.  ? ?Assessment &  Plan:    ?1.  Oligometastatic HER2 positive left-sided breast cancer ?-Managed by Dr. 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 ?- She was started on [facility] with her first dose of carboplatin-docetaxel-pertuzumab-trastuzumab given on 11/04/2021. ?- She received Ziextenzo on 11/06/2021. ?- Labs today (11/11/2021): CBC with mild leukocytosis WBC 13.4 (secondary to Ziextenzo), hypomagnesemia 1.5, CMP with elevated creatinine 1.44, normal potassium ?- PLAN: IV fluids and electrolyte replacement given today during visit. ?- Patient is aware that she can schedule symptom management follow-up next week if needed. ?- Follow-up with Dr. Delton Coombes as scheduled on 11/25/2021 for repeat labs, office visit, and next cycle of TCHP  ? ?2.  Mucositis ?- Onset of mouth sores after chemotherapy, and has been unable to tolerate solid food.  She reports that even water is hurting her mouth "like razor blades," and she is drinking less than 32 ounces of water daily. ?- She called the nurse line on 11/10/2021 and was given prescription for Diflucan 100 mg x 6 doses plus Carafate/lidocaine mouthwash.  Patient reports that her mouth pain has slightly improved with these interventions.  Due to her decreased ability to tolerate oral intake, she has lost 6 pounds in the past week. ?- PLAN: Continue Diflucan and Carafate/lidocaine mouthwash. ?- Instructed to call clinic if her sores have not improved by next week or if she continues to have difficulty with oral intake. ?- Educated on salt/baking soda rinse to use during next cycle of chemotherapy to help prevent mucositis. ?- Educated on the importance of smoking cessation to prevent mucositis. ? ?3.  Headaches and body aches ?- After receiving her Ziextenzo injection on Friday, she had onset of generalized body aches and headache, which was not relieved with Tylenol.  She has not had any neurologic deficits associated with headache.  She reports that her pain has started to ease up today. ?- PLAN: Patient is feeling better.  She will  call if she has any recurrent or worsening symptoms.  Explained that the symptoms are likely self-limited and should resolve in a few days. ? ?4.  Dehydration ?- Secondary to decreased oral intake from mucositis ?- Evidenced today by elevated creatinine 1.44, poor skin turgor, dry skin ?- PLAN: 1 L NS given today ?- Encourage hydration with 64 ounces of water daily ?- Patient instructed to call clinic if she is unable to drink 64 ounce of water daily so that we can schedule her for additional IV fluids ? ?5.  Nutrition and weight loss ?- Decreased oral intake due to mucositis ?- She has lost 6 pounds in the past week ?- PLAN: Encouraged patient to drink Ensure/Boost x3 while she is unable to tolerate solid food (secondary to mucositis) ? ?6.  Diarrhea ?- She has had mild diarrhea, with 1 watery bowel movement daily.  She denies any nausea or vomiting. ?- PLAN: Patient instructed to call the clinic if she has more than 1 episode of diarrhea daily. ? ? ? ?Objective:  ? ?Physical Exam:  ?There were no vitals taken for this visit. ?ECOG: 1 ? ?Physical Exam ?Vitals reviewed.  ?Constitutional:   ?   Appearance: Normal appearance. She is obese.  ?HENT:  ?   Mouth/Throat:  ?   Comments: Erythema and shallow ulcerations on buccal mucosa and hard palate.  Whitish plaque overlaying ulcerations. ?Cardiovascular:  ?   Rate and Rhythm: Normal rate and regular rhythm.  ?   Pulses: Normal pulses.  ?   Heart  sounds: Normal heart sounds.  ?Pulmonary:  ?   Effort: Pulmonary effort is normal.  ?   Breath sounds: Normal breath sounds.  ?Skin: ?   Comments: Dry skin, poor skin turgor. ?Mild erythema of skin surrounding mouth.  ?Neurological:  ?   General: No focal deficit present.  ?   Mental Status: She is alert and oriented to person, place, and time.  ?Psychiatric:     ?   Mood and Affect: Mood normal.     ?   Behavior: Behavior normal.  ? ? ?Lab Review:  ?   ?Component Value Date/Time  ? NA 140 11/04/2021 0737  ? NA 142 01/19/2018  1224  ? K 3.5 11/04/2021 0737  ? CL 108 11/04/2021 0737  ? CO2 24 11/04/2021 0737  ? GLUCOSE 132 (H) 11/04/2021 0737  ? BUN 26 (H) 11/04/2021 0737  ? BUN 10 01/19/2018 1224  ? CREATININE 1.38 (H) 03/15/2

## 2021-11-17 ENCOUNTER — Encounter (HOSPITAL_COMMUNITY): Payer: Self-pay

## 2021-11-17 ENCOUNTER — Telehealth: Payer: Self-pay

## 2021-11-17 NOTE — Progress Notes (Signed)
Notification received from Research RN that patient has not been eating or drinking very much at all per patient's daughter. Call placed to patient who reports that the thought of food makes her feel "queasy," I asked the patient if compazine helps with that feeling and allows her to eat. Patient reports that she has not tried compazine. Encouraged patient to try compazine and call back by the end of the week to let us know how it helping. Patient verbalized understanding. ?

## 2021-11-17 NOTE — Telephone Encounter (Signed)
Patient's daughter [name] left a message on my voicemail stating pt is having horrible nausea and has only been able to tolerate one Pedialyte Popsicle. Ms Lantry has completed her study participation. I sent a secure chat and an inbox message to Adonis Huguenin, RN Navigator with [facility] requesting that someone call the pt asap.  ? ?Pt's phone number is [phone]. ? ?Marjie Skiff Shyler Hamill, RN, BSN, CHPN ?She  Her  Hers ?Clinical Research Nurse ?[place] ?Direct Dial [phone]  Pager [phone] ?11/17/2021 11:34 AM ?

## 2021-11-25 ENCOUNTER — Inpatient Hospital Stay (HOSPITAL_COMMUNITY): Payer: Managed Care, Other (non HMO) | Admitting: Hematology

## 2021-11-25 ENCOUNTER — Inpatient Hospital Stay (HOSPITAL_COMMUNITY): Payer: Managed Care, Other (non HMO)

## 2021-11-25 ENCOUNTER — Inpatient Hospital Stay (HOSPITAL_COMMUNITY): Payer: Managed Care, Other (non HMO) | Attending: Hematology

## 2021-11-25 VITALS — BP 130/82 | HR 71 | Temp 97.7°F | Resp 18 | Ht 60.63 in | Wt 165.8 lb

## 2021-11-25 DIAGNOSIS — C50812 Malignant neoplasm of overlapping sites of left female breast: Secondary | ICD-10-CM

## 2021-11-25 DIAGNOSIS — F419 Anxiety disorder, unspecified: Secondary | ICD-10-CM | POA: Insufficient documentation

## 2021-11-25 DIAGNOSIS — Z79899 Other long term (current) drug therapy: Secondary | ICD-10-CM | POA: Insufficient documentation

## 2021-11-25 DIAGNOSIS — F17210 Nicotine dependence, cigarettes, uncomplicated: Secondary | ICD-10-CM | POA: Diagnosis not present

## 2021-11-25 DIAGNOSIS — Z95828 Presence of other vascular implants and grafts: Secondary | ICD-10-CM

## 2021-11-25 DIAGNOSIS — Z170 Estrogen receptor positive status [ER+]: Secondary | ICD-10-CM | POA: Diagnosis not present

## 2021-11-25 DIAGNOSIS — Z5111 Encounter for antineoplastic chemotherapy: Secondary | ICD-10-CM | POA: Diagnosis present

## 2021-11-25 DIAGNOSIS — C50919 Malignant neoplasm of unspecified site of unspecified female breast: Secondary | ICD-10-CM | POA: Diagnosis not present

## 2021-11-25 LAB — CBC WITH DIFFERENTIAL/PLATELET
Abs Immature Granulocytes: 0.42 10*3/uL — ABNORMAL HIGH (ref 0.00–0.07)
Basophils Absolute: 0.2 10*3/uL — ABNORMAL HIGH (ref 0.0–0.1)
Basophils Relative: 1 %
Eosinophils Absolute: 0.2 10*3/uL (ref 0.0–0.5)
Eosinophils Relative: 1 %
HCT: 33.1 % — ABNORMAL LOW (ref 36.0–46.0)
Hemoglobin: 11 g/dL — ABNORMAL LOW (ref 12.0–15.0)
Immature Granulocytes: 3 %
Lymphocytes Relative: 15 %
Lymphs Abs: 2.5 10*3/uL (ref 0.7–4.0)
MCH: 31.2 pg (ref 26.0–34.0)
MCHC: 33.2 g/dL (ref 30.0–36.0)
MCV: 93.8 fL (ref 80.0–100.0)
Monocytes Absolute: 1.6 10*3/uL — ABNORMAL HIGH (ref 0.1–1.0)
Monocytes Relative: 10 %
Neutro Abs: 11.6 10*3/uL — ABNORMAL HIGH (ref 1.7–7.7)
Neutrophils Relative %: 70 %
Platelets: 402 10*3/uL — ABNORMAL HIGH (ref 150–400)
RBC: 3.53 MIL/uL — ABNORMAL LOW (ref 3.87–5.11)
RDW: 15.5 % (ref 11.5–15.5)
WBC: 16.4 10*3/uL — ABNORMAL HIGH (ref 4.0–10.5)
nRBC: 0 % (ref 0.0–0.2)

## 2021-11-25 LAB — MAGNESIUM: Magnesium: 1.3 mg/dL — ABNORMAL LOW (ref 1.7–2.4)

## 2021-11-25 LAB — COMPREHENSIVE METABOLIC PANEL
ALT: 14 U/L (ref 0–44)
AST: 16 U/L (ref 15–41)
Albumin: 3.5 g/dL (ref 3.5–5.0)
Alkaline Phosphatase: 144 U/L — ABNORMAL HIGH (ref 38–126)
Anion gap: 7 (ref 5–15)
BUN: 23 mg/dL (ref 8–23)
CO2: 22 mmol/L (ref 22–32)
Calcium: 9.4 mg/dL (ref 8.9–10.3)
Chloride: 108 mmol/L (ref 98–111)
Creatinine, Ser: 1.53 mg/dL — ABNORMAL HIGH (ref 0.44–1.00)
GFR, Estimated: 38 mL/min — ABNORMAL LOW (ref >60)
Glucose, Bld: 104 mg/dL — ABNORMAL HIGH (ref 70–99)
Potassium: 4.1 mmol/L (ref 3.5–5.1)
Sodium: 137 mmol/L (ref 135–145)
Total Bilirubin: 0.6 mg/dL (ref 0.3–1.2)
Total Protein: 7.4 g/dL (ref 6.5–8.1)

## 2021-11-25 MED ORDER — PALONOSETRON HCL INJECTION 0.25 MG/5ML
0.2500 mg | Freq: Once | INTRAVENOUS | Status: AC
  Administered 2021-11-25: 0.25 mg via INTRAVENOUS
  Filled 2021-11-25: qty 5

## 2021-11-25 MED ORDER — SODIUM CHLORIDE 0.9 % IV SOLN: Freq: Once | INTRAVENOUS | Status: AC

## 2021-11-25 MED ORDER — SODIUM CHLORIDE 0.9 % IV SOLN
10.0000 mg | Freq: Once | INTRAVENOUS | Status: AC
  Administered 2021-11-25: 10 mg via INTRAVENOUS
  Filled 2021-11-25: qty 10

## 2021-11-25 MED ORDER — SODIUM CHLORIDE 0.9 % IV SOLN
425.4000 mg | Freq: Once | INTRAVENOUS | Status: AC
  Administered 2021-11-25: 430 mg via INTRAVENOUS
  Filled 2021-11-25: qty 43

## 2021-11-25 MED ORDER — TRASTUZUMAB-ANNS CHEMO 150 MG IV SOLR
6.0000 mg/kg | Freq: Once | INTRAVENOUS | Status: AC
  Administered 2021-11-25: 462 mg via INTRAVENOUS
  Filled 2021-11-25: qty 22

## 2021-11-25 MED ORDER — MAGNESIUM SULFATE 4 GM/100ML IV SOLN: 4.0000 g | Freq: Once | INTRAVENOUS | Status: DC

## 2021-11-25 MED ORDER — HEPARIN SOD (PORK) LOCK FLUSH 100 UNIT/ML IV SOLN
500.0000 [IU] | Freq: Once | INTRAVENOUS | Status: AC | PRN
  Administered 2021-11-25: 500 [IU]

## 2021-11-25 MED ORDER — ACETAMINOPHEN 325 MG PO TABS
650.0000 mg | ORAL_TABLET | Freq: Once | ORAL | Status: AC
  Administered 2021-11-25: 650 mg via ORAL
  Filled 2021-11-25: qty 2

## 2021-11-25 MED ORDER — SODIUM CHLORIDE 0.9 % IV SOLN
420.0000 mg | Freq: Once | INTRAVENOUS | Status: AC
  Administered 2021-11-25: 420 mg via INTRAVENOUS
  Filled 2021-11-25: qty 14

## 2021-11-25 MED ORDER — SODIUM CHLORIDE 0.9 % IV SOLN
150.0000 mg | Freq: Once | INTRAVENOUS | Status: AC
  Administered 2021-11-25: 150 mg via INTRAVENOUS
  Filled 2021-11-25: qty 150

## 2021-11-25 MED ORDER — MAGNESIUM SULFATE 2 GM/50ML IV SOLN
2.0000 g | INTRAVENOUS | Status: AC
  Administered 2021-11-25: 2 g via INTRAVENOUS
  Filled 2021-11-25: qty 50

## 2021-11-25 MED ORDER — MAGNESIUM OXIDE -MG SUPPLEMENT 400 (240 MG) MG PO TABS: 400.0000 mg | ORAL_TABLET | Freq: Three times a day (TID) | ORAL | 3 refills | Status: DC

## 2021-11-25 MED ORDER — MAGNESIUM SULFATE 2 GM/50ML IV SOLN
2.0000 g | Freq: Once | INTRAVENOUS | Status: AC
  Administered 2021-11-25: 2 g via INTRAVENOUS

## 2021-11-25 MED ORDER — DIPHENHYDRAMINE HCL 25 MG PO CAPS
50.0000 mg | ORAL_CAPSULE | Freq: Once | ORAL | Status: AC
  Administered 2021-11-25: 50 mg via ORAL
  Filled 2021-11-25: qty 2

## 2021-11-25 MED ORDER — SODIUM CHLORIDE 0.9% FLUSH
10.0000 mL | INTRAVENOUS | Status: DC | PRN
  Administered 2021-11-25: 10 mL

## 2021-11-25 MED ORDER — SODIUM CHLORIDE 0.9 % IV SOLN
75.0000 mg/m2 | Freq: Once | INTRAVENOUS | Status: AC
  Administered 2021-11-25: 140 mg via INTRAVENOUS
  Filled 2021-11-25: qty 14

## 2021-11-25 NOTE — Progress Notes (Signed)
Patient has been examined by Dr. Katragadda, and vital signs and labs have been reviewed. ANC, Creatinine, LFTs, hemoglobin, and platelets are within treatment parameters per M.D. - pt may proceed with treatment.    °

## 2021-11-25 NOTE — Progress Notes (Signed)
? ?[facility] ?[address] ?[place],  [address] ? ? ?CLINIC:  ?Medical Oncology/Hematology ? ?PCP:  ?Celene Squibb, MD ?[address] [place] [address] ?[phone] ? ? ?REASON FOR VISIT:  ?Follow-up for locally advanced left breast cancer ? ?PRIOR THERAPY: none ? ?NGS Results: not done ? ?CURRENT THERAPY: surveillance ? ?BRIEF ONCOLOGIC HISTORY:  ?Oncology History  ?Breast cancer, left ([place])  ?10/15/2021 Initial Diagnosis  ? Breast cancer, left [facility]) ?  ?11/04/2021 -  Chemotherapy  ? Patient is on Treatment Plan : BREAST  Docetaxel + Carboplatin + Trastuzumab + Pertuzumab  (TCHP) q21d   ?   ? Genetic Testing  ? Negative genetic testing. No pathogenic variants identified on the [facility] Multi-Cancer+RNA panel. The report date is 11/02/2021.  ? ?The Multi-Cancer Panel + RNA offered by [facility] includes sequencing and/or deletion duplication testing of the following 84 genes: AIP, ALK, APC, ATM, AXIN2,BAP1,  BARD1, BLM, BMPR1A, BRCA1, BRCA2, BRIP1, CASR, CDC73, CDH1, CDK4, CDKN1B, CDKN1C, CDKN2A (p14ARF), CDKN2A (p16INK4a), CEBPA, CHEK2, CTNNA1, DICER1, DIS3L2, EGFR (c.2369C>T, p.Thr790Met variant only), EPCAM (Deletion/duplication testing only), FH, FLCN, GATA2, GPC3, GREM1 (Promoter region deletion/duplication testing only), HOXB13 (c.251G>A, p.Gly84Glu), HRAS, KIT, MAX, MEN1, MET, MITF (c.952G>A, p.Glu318Lys variant only), MLH1, MSH2, MSH3, MSH6, MUTYH, NBN, NF1, NF2, NTHL1, PALB2, PDGFRA, PHOX2B, PMS2, POLD1, POLE, POT1, PRKAR1A, PTCH1, PTEN, RAD50, RAD51C, RAD51D, RB1, RECQL4, RET, RUNX1, SDHAF2, SDHA (sequence changes only), SDHB, SDHC, SDHD, SMAD4, SMARCA4, SMARCB1, SMARCE1, STK11, SUFU, TERC, TERT, TMEM127, TP53, TSC1, TSC2, VHL, WRN and WT1. ?  ? ? ?CANCER STAGING: ? Cancer Staging  ?Breast cancer, left ([place] [facility]) ?Staging form: Breast, AJCC 8th Edition ?- Clinical stage from 10/15/2021: Stage IIA (cT3, cN1, cM0, G2, ER+, PR+, HER2+) - Unsigned ? ? ?INTERVAL HISTORY:  ?[name], a 66 y.o. female, returns for routine follow-up and consideration for next cycle of chemotherapy. Traci Fuller was last seen on 10/28/2021. ? ?Due for cycle #2 of TCHP today.  ? ?Overall, she tells me she has been feeling pretty well. She reports below knee leg pains, severe fatigue, diarrhea occurring 1-2 times daily which was helped with Imodium and lomotil, and mouth sores for the first 1.5 weeks after treatment. She reports skin peeling on her hands since her last treatment, and she denies history of skin peeling. Her taste has been poor since her last treatment. She had mild tingling numbness for 2 days following treatment, but she denies current tingling/numbness. She took tylenol once for a headache which lasted 1 day, but she otherwise has not taken Tylenol. She is not drinking Boost/Ensure daily. She reports hair thinning.  ? ?Overall, she feels ready for next cycle of chemo today.  ? ?REVIEW OF SYSTEMS:  ?Review of Systems  ?Constitutional:  Negative for appetite change and fatigue (resolved).  ?HENT:   Negative for mouth sores (resolved).   ?Gastrointestinal:  Negative for diarrhea (resolved).  ?Musculoskeletal:  Negative for arthralgias (resolved).  ?Neurological:  Positive for headaches. Negative for numbness (resolved).  ?Psychiatric/Behavioral:  Positive for sleep disturbance.   ?All other systems reviewed and are negative. ? ?PAST MEDICAL/SURGICAL HISTORY:  ?Past Medical History:  ?Diagnosis Date  ? CAD (coronary artery disease)   ? 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 Cancer [facility])   ? Current smoker   ? Essential hypertension   ? Family history of bladder cancer   ? Family history of breast cancer   ? Family  history of leukemia   ? Family history of uterine cancer   ? Hyperlipidemia LDL goal <70   ? Myocardial infarction [facility])   ? PONV (postoperative nausea and vomiting)   ? Port-A-Cath in place 10/29/2021  ? ?Past Surgical History:  ?Procedure Laterality Date  ?  ABDOMINAL HYSTERECTOMY    ? BREAST BIOPSY Bilateral 10/16/2021  ? CORONARY STENT INTERVENTION N/A 01/10/2018  ? Procedure: CORONARY STENT INTERVENTION;  Surgeon: Jettie Booze, MD;  Location: [facility];  Service: Cardiovascular;  Laterality: N/A;  ? LEFT HEART CATH AND CORONARY ANGIOGRAPHY N/A 01/10/2018  ? Procedure: LEFT HEART CATH AND CORONARY ANGIOGRAPHY;  Surgeon: Jettie Booze, MD;  Location: [facility];  Service: Cardiovascular;  Laterality: N/A;  ? PORTACATH PLACEMENT Right 10/26/2021  ? Procedure: INSERTION PORT-A-CATH;  Surgeon: Virl Cagey, MD;  Location: [facility];  Service: General;  Laterality: Right;  ? ? ?SOCIAL HISTORY:  ?Social History  ? ?Socioeconomic History  ? Marital status: Married  ?  Spouse name: Not on file  ? Number of children: Not on file  ? Years of education: Not on file  ? Highest education level: Not on file  ?Occupational History  ? Not on file  ?Tobacco Use  ? Smoking status: Every Day  ?  Packs/day: 1.00  ?  Years: 43.00  ?  Pack years: 43.00  ?  Types: Cigarettes  ?  Start date: 9  ? Smokeless tobacco: Never  ?Vaping Use  ? Vaping Use: Never used  ?Substance and Sexual Activity  ? Alcohol use: Not Currently  ? Drug use: Never  ? Sexual activity: Yes  ?Other Topics Concern  ? Not on file  ?Social History Narrative  ? Not on file  ? ?Social Determinants of Health  ? ?Financial Resource Strain: Not on file  ?Food Insecurity: Not on file  ?Transportation Needs: Not on file  ?Physical Activity: Not on file  ?Stress: Not on file  ?Social Connections: Not on file  ?Intimate Partner Violence: Not on file  ? ? ?FAMILY HISTORY:  ?Family History  ?Problem Relation Age of Onset  ? Bladder Cancer Mother 81  ? Cervical cancer Mother   ?     dx 79s, hysterectomy  ? Hypertension Father   ? Cirrhosis Father   ? Leukemia Brother 81  ? Liver cancer Brother 72  ? Breast cancer Maternal Aunt   ?     dx 73s  ? Cancer Maternal Uncle   ?     unk type d. 26  ? Cancer  Paternal Aunt   ?     unk type  ? Breast cancer Maternal Grandmother   ?     dx 59s  ? Uterine cancer Maternal Grandmother   ? ? ?CURRENT MEDICATIONS:  ?Current Outpatient Medications  ?Medication Sig Dispense Refill  ? ALPRAZolam (XANAX) 0.25 MG tablet Take 1 tablet (0.25 mg total) by mouth 2 (two) times daily as needed for anxiety. 60 tablet 0  ? amLODipine (NORVASC) 10 MG tablet Take 10 mg by mouth daily.    ? aspirin EC 81 MG EC tablet Take 1 tablet (81 mg total) by mouth daily. 90 tablet 3  ? CARBOPLATIN IV Inject into the vein every 21 ( twenty-one) days.    ? co-enzyme Q-10 30 MG capsule Take 30 mg by mouth daily.    ? diphenoxylate-atropine (LOMOTIL) 2.5-0.025 MG tablet Take 1 tablet by mouth 4 (four) times daily as needed for  diarrhea or loose stools. 60 tablet 3  ? DOCEtaxel (TAXOTERE IV) Inject into the vein every 21 ( twenty-one) days.    ? Evolocumab (REPATHA SURECLICK) 142 MG/ML SOAJ Inject 140 mg into the skin every 14 (fourteen) days. 2 mL 11  ? fluconazole (DIFLUCAN) 100 MG tablet Take 2 tablets today followed by 1 tablet daily for the next 4 days 6 tablet 0  ? gabapentin (NEURONTIN) 300 MG capsule Take 300 mg by mouth 2 (two) times daily.    ? levocetirizine (XYZAL) 5 MG tablet Take 5 mg by mouth every evening.    ? lidocaine (XYLOCAINE) 2 % solution Use as directed 15 mLs in the mouth or throat every 6 (six) hours as needed for mouth pain (Mix 5 ml with 5 ml of carafate -  swish and swallow). 100 mL 0  ? metoprolol tartrate (LOPRESSOR) 25 MG tablet Take 1 tablet (25 mg total) by mouth 2 (two) times daily. Please call and schedule overdue appointment with Dr. Gwenlyn Found for further refills . SECOND ATTEMPT! 30 tablet 0  ? omeprazole (PRILOSEC) 20 MG capsule Take 20 mg by mouth daily.    ? oxyCODONE (ROXICODONE) 5 MG immediate release tablet Take 1 tablet (5 mg total) by mouth every 4 (four) hours as needed for severe pain or breakthrough pain. 5 tablet 0  ? Pertuzumab (PERJETA IV) Inject into the vein  every 21 ( twenty-one) days.    ? sucralfate (CARAFATE) 1 GM/10ML suspension Mix 5 ml with 5 ml of 2% lidocaine - swish and swallow every 6 hours as needed 420 mL 0  ? ticagrelor (BRILINTA) 90 MG TABS tabl

## 2021-11-25 NOTE — Patient Instructions (Signed)
[facility]  Discharge Instructions: Thank you for choosing [facility] to provide your oncology and hematology care.  If you have a lab appointment with the Cancer Center, please come in thru the Main Entrance and check in at the main information desk.  Wear comfortable clothing and clothing appropriate for easy access to any Portacath or PICC line.   We strive to give you quality time with your provider. You may need to reschedule your appointment if you arrive late (15 or more minutes).  Arriving late affects you and other patients whose appointments are after yours.  Also, if you miss three or more appointments without notifying the office, you may be dismissed from the clinic at the provider's discretion.      For prescription refill requests, have your pharmacy contact our office and allow 72 hours for refills to be completed.        To help prevent nausea and vomiting after your treatment, we encourage you to take your nausea medication as directed.  BELOW ARE SYMPTOMS THAT SHOULD BE REPORTED IMMEDIATELY: *FEVER GREATER THAN 100.4 F (38 C) OR HIGHER *CHILLS OR SWEATING *NAUSEA AND VOMITING THAT IS NOT CONTROLLED WITH YOUR NAUSEA MEDICATION *UNUSUAL SHORTNESS OF BREATH *UNUSUAL BRUISING OR BLEEDING *URINARY PROBLEMS (pain or burning when urinating, or frequent urination) *BOWEL PROBLEMS (unusual diarrhea, constipation, pain near the anus) TENDERNESS IN MOUTH AND THROAT WITH OR WITHOUT PRESENCE OF ULCERS (sore throat, sores in mouth, or a toothache) UNUSUAL RASH, SWELLING OR PAIN  UNUSUAL VAGINAL DISCHARGE OR ITCHING   Items with * indicate a potential emergency and should be followed up as soon as possible or go to the Emergency Department if any problems should occur.  Please show the CHEMOTHERAPY ALERT CARD or IMMUNOTHERAPY ALERT CARD at check-in to the Emergency Department and triage nurse.  Should you have questions after your visit or need to cancel  or reschedule your appointment, please contact [facility] [phone]  and follow the prompts.  Office hours are 8:00 a.m. to 4:30 p.m. Monday - Friday. Please note that voicemails left after 4:00 p.m. may not be returned until the following business day.  We are closed weekends and major holidays. You have access to a nurse at all times for urgent questions. Please call the main number to the clinic [phone] and follow the prompts.  For any non-urgent questions, you may also contact your provider using MyChart. We now offer e-Visits for anyone 18 and older to request care online for non-urgent symptoms. For details visit [email or web address].   Also download the MyChart app! Go to the app store, search "MyChart", open the app, select [facility], and log in with your MyChart username and password.  Due to Covid, a mask is required upon entering the hospital/clinic. If you do not have a mask, one will be given to you upon arrival. For doctor visits, patients may have 1 support person aged 18 or older with them. For treatment visits, patients cannot have anyone with them due to current Covid guidelines and our immunocompromised population.  

## 2021-11-25 NOTE — Progress Notes (Signed)
Patient presents today for chemotherapy infusion.  Patient is in satisfactory condition with no complaints voiced.  Vital signs are stable.  Labs reviewed by Dr. Delton Coombes during her office visit.  Creatinine today is 1.53 and magnesium is 1.3.  All other labs are within treatment parameters.  Patient will receive NS 500 mL over one hour and Magnesium 4 grams IV x one dose today per Dr. Delton Coombes.  We will proceed with treatment per MD orders.  ?

## 2021-11-25 NOTE — Progress Notes (Signed)
Patient presents today for chemotherapy infusion.  Patient is in satisfactory condition with no complaints voiced.  Vital signs are stable.  Labs reviewed by Dr. Delton Coombes during her office visit.  Creatinine today is 1.53 and magnesium is 1.3.  Patient will receive Magnesium 4 grams IV x one dose and NS 500 mL IV over one hour today per Dr. Delton Coombes.  All other labs are within treatment parameters.  We will proceed with treatment per MD orders.  ? ? Patient tolerated treatment well with no complaints voiced.  Patient left ambulatory in stable condition.  Vital signs stable at discharge.  Follow up as scheduled.    ?

## 2021-11-25 NOTE — Patient Instructions (Addendum)
[place] at [facility] ?Discharge Instructions ? ? ?You were seen and examined today by Dr. Delton Coombes. ? ?He reviewed your lab work which is normal/stable with the exception of your magnesium.  We will give you IV magnesium in the clinic and also sent a prescription to your pharmacy to take magnesium three times a day. You kidney function is also slightly elevated.  We will give you extra IV fluid today.  ? ?We will proceed with your treatment today.  ? ?Return as scheduled.  ? ? ?Thank you for choosing [place] at [facility] to provide your oncology and hematology care.  To afford each patient quality time with our provider, please arrive at least 15 minutes before your scheduled appointment time.  ? ?If you have a lab appointment with the [facility] please come in thru the Main Entrance and check in at the main information desk. ? ?You need to re-schedule your appointment should you arrive 10 or more minutes late.  We strive to give you quality time with our providers, and arriving late affects you and other patients whose appointments are after yours.  Also, if you no show three or more times for appointments you may be dismissed from the clinic at the providers discretion.     ?Again, thank you for choosing [facility].  Our hope is that these requests will decrease the amount of time that you wait before being seen by our physicians.       ?_____________________________________________________________ ? ?Should you have questions after your visit to [facility], please contact our office at [phone] and follow the prompts.  Our office hours are 8:00 a.m. and 4:30 p.m. Monday - Friday.  Please note that voicemails left after 4:00 p.m. may not be returned until the following business day.  We are closed weekends and major holidays.  You do have access to a nurse 24-7, just call the main number to the clinic [phone] and do  not press any options, hold on the line and a nurse will answer the phone.   ? ?For prescription refill requests, have your pharmacy contact our office and allow 72 hours.   ? ?Due to Covid, you will need to wear a mask upon entering the hospital. If you do not have a mask, a mask will be given to you at the Main Entrance upon arrival. For doctor visits, patients may have 1 support person age 75 or older with them. For treatment visits, patients can not have anyone with them due to social distancing guidelines and our immunocompromised population.  ? ?   ?

## 2021-11-26 ENCOUNTER — Ambulatory Visit (HOSPITAL_COMMUNITY): Payer: Managed Care, Other (non HMO)

## 2021-11-26 ENCOUNTER — Encounter (HOSPITAL_COMMUNITY): Payer: Self-pay

## 2021-11-26 LAB — CANCER ANTIGEN 27.29: CA 27.29: 112.9 U/mL — ABNORMAL HIGH (ref 0.0–38.6)

## 2021-11-26 LAB — CANCER ANTIGEN 15-3: CA 15-3: 97.9 U/mL — ABNORMAL HIGH (ref 0.0–25.0)

## 2021-12-01 ENCOUNTER — Telehealth (HOSPITAL_COMMUNITY): Payer: Self-pay | Admitting: *Deleted

## 2021-12-01 NOTE — Telephone Encounter (Signed)
Patient called with c/o increased redness and peeling of hands and feet.  This was addressed at last appointment, however she has not been putting anything on her feet except neosporin.  Advised to make sure feet were thoroughly dry after bathing and to use udder cream in a generous portion to hands and feet. Also reiterated to her to wear gloves when doing dishes.  Verbalized understanding. ?

## 2021-12-03 ENCOUNTER — Telehealth: Payer: Self-pay | Admitting: Emergency Medicine

## 2021-12-03 NOTE — Telephone Encounter (Signed)
DCP-001: Use of a Clinical Trial Screening Tool to Address Cancer Health Disparities in the [facility]) ? ?INTRO STUDY/CONSENTS ? ?Patient Traci Fuller was identified by Clinical Research Nurse Cristie Hem as a potential candidate for the above listed study.  This Clinical Research Nurse called patient Traci Fuller, [id number], today in a manner and location that ensures patient privacy to discuss participation in the above listed research study.  Verified patient with two identifiers.  A copy of the informed consent document and separate HIPAA Authorization was provided via email to the patient.  Patient reads, speaks, and understands [place].  Verified correct email in patient's chart. ?Patient was provided with the contact information of this Nurse and encouraged to contact the research team with any questions.  Approximately 10 minutes were spent with the patient reviewing the informed consent documents.  Patient was provided the option of reviewing informed consent documents at home and was encouraged to review at their convenience with their support network, including other care providers. Patient is reviewing consents at home.  Research team to contact patient in next few days to determine interest. ? ?Wells Guiles 'Learta Codding' Elizabeth Haff, RN, BSN ?Clinical Research Nurse I ?12/03/21 ?10:55 AM ? ? ?

## 2021-12-04 DIAGNOSIS — Z170 Estrogen receptor positive status [ER+]: Secondary | ICD-10-CM

## 2021-12-04 NOTE — Research (Signed)
Trial Name:  [id number]: Use of a Clinical Trial Screening Tool to Address Cancer Health Disparities in the [facility]) ? ?Patient Traci Fuller was identified by Larina Bras, RN as a potential candidate for the above listed study.  This Clinical Research Nurse met with [name], [id number] on 12/04/21 via phone to discuss participation in the above listed research study. Confirmed patient identity and that she had read the informed consents emailed to her yesterday by Donell Sievert.  Patient is Unaccompanied.  Patient was previously provided with informed consent documents.  Patient confirmed they have read the informed consent documents. ? ?As outlined in the informed consent form, this Nurse and [name] discussed the purpose of the research study, the investigational nature of the study, study procedures and requirements for study participation, potential risks and benefits of study participation, as well as alternatives to participation.  This study is not blinded or double-blinded. The patient understands participation is voluntary and they may withdraw from study participation at any time.  This study does not involve randomization.  This study does not involve an investigational drug or device. This study does not involve a placebo. Patient understands enrollment is pending full eligibility review.  ? ?Confidentiality and how the patient's information will be used as part of study participation were discussed.  Patient was informed there is not reimbursement provided for their time and effort spent on trial participation.  The patient is encouraged to discuss research study participation with their insurance provider to determine what costs they may incur as part of study participation, including research related injury.   ? ?All questions were answered to patient's satisfaction.  The telephone informed consent form dated 06/02/21 was read  verbatim to patient. The patient's mental and emotional status is appropriate to provide informed consent, and the patient verbalizes an understanding of study participation.  Patient has agreed to participate in the above listed research study and has verbally agreed to participate on 12/04/21 at 1:54PM.  No study specific procedures were obtained prior to the signing of the informed consent document.  Approximately 10 minutes were spent with the patient reviewing the informed consent documents.  Patient was not requested to complete a Release of Information form. ? ?Marjie Skiff. Vola Beneke, RN, BSN, CHPN ?She  Her  Hers ?Clinical Research Nurse ?[place] ?Direct Dial [phone]  Pager [phone] ?12/04/2021 2:05 PM  ?

## 2021-12-04 NOTE — Research (Signed)
DCP-001: Use of a Clinical Trial Screening Tool to Address Cancer Health Disparities in the [facility]) ? ?This Nurse has reviewed this patient's inclusion and exclusion criteria and confirmed Traci Fuller is eligible for study participation.  Patient will continue with enrollment. ? ?Eligibility confirmed by treating investigator, who also agrees that patient should proceed with enrollment. ? ?Demographic questions were answered by patient. ? ?Marjie Skiff. Shmuel Girgis, RN, BSN, CHPN ?She  Her  Hers ?Clinical Research Nurse ?[place] ?Direct Dial [phone]  Pager [phone] ?12/04/2021 2:06 PM ? ?

## 2021-12-14 NOTE — Progress Notes (Signed)
Removed Ziextenzo from Day 3 of treatment.  Insurance requires self administration at home post chemotherapy treatment. ? ?Henreitta Leber, PharmD ?12/14/21 @ 1000 ?

## 2021-12-16 ENCOUNTER — Inpatient Hospital Stay (HOSPITAL_COMMUNITY): Payer: Managed Care, Other (non HMO)

## 2021-12-16 ENCOUNTER — Inpatient Hospital Stay (HOSPITAL_COMMUNITY): Payer: Managed Care, Other (non HMO) | Admitting: Hematology

## 2021-12-16 VITALS — BP 122/65 | HR 81 | Temp 97.1°F | Resp 18 | Ht 60.0 in | Wt 163.2 lb

## 2021-12-16 VITALS — BP 137/69 | HR 80 | Temp 97.4°F | Resp 18

## 2021-12-16 DIAGNOSIS — C50812 Malignant neoplasm of overlapping sites of left female breast: Secondary | ICD-10-CM

## 2021-12-16 DIAGNOSIS — Z95828 Presence of other vascular implants and grafts: Secondary | ICD-10-CM

## 2021-12-16 DIAGNOSIS — Z170 Estrogen receptor positive status [ER+]: Secondary | ICD-10-CM

## 2021-12-16 DIAGNOSIS — C50919 Malignant neoplasm of unspecified site of unspecified female breast: Secondary | ICD-10-CM | POA: Diagnosis not present

## 2021-12-16 LAB — CBC WITH DIFFERENTIAL/PLATELET
Abs Immature Granulocytes: 0.09 10*3/uL — ABNORMAL HIGH (ref 0.00–0.07)
Basophils Absolute: 0.1 10*3/uL (ref 0.0–0.1)
Basophils Relative: 1 %
Eosinophils Absolute: 0 10*3/uL (ref 0.0–0.5)
Eosinophils Relative: 0 %
HCT: 27.7 % — ABNORMAL LOW (ref 36.0–46.0)
Hemoglobin: 8.9 g/dL — ABNORMAL LOW (ref 12.0–15.0)
Immature Granulocytes: 1 %
Lymphocytes Relative: 11 %
Lymphs Abs: 1 10*3/uL (ref 0.7–4.0)
MCH: 32 pg (ref 26.0–34.0)
MCHC: 32.1 g/dL (ref 30.0–36.0)
MCV: 99.6 fL (ref 80.0–100.0)
Monocytes Absolute: 0.6 10*3/uL (ref 0.1–1.0)
Monocytes Relative: 7 %
Neutro Abs: 7.4 10*3/uL (ref 1.7–7.7)
Neutrophils Relative %: 80 %
Platelets: 162 10*3/uL (ref 150–400)
RBC: 2.78 MIL/uL — ABNORMAL LOW (ref 3.87–5.11)
RDW: 19.6 % — ABNORMAL HIGH (ref 11.5–15.5)
WBC: 9.2 10*3/uL (ref 4.0–10.5)
nRBC: 0 % (ref 0.0–0.2)

## 2021-12-16 LAB — COMPREHENSIVE METABOLIC PANEL
ALT: 14 U/L (ref 0–44)
AST: 14 U/L — ABNORMAL LOW (ref 15–41)
Albumin: 3.3 g/dL — ABNORMAL LOW (ref 3.5–5.0)
Alkaline Phosphatase: 210 U/L — ABNORMAL HIGH (ref 38–126)
Anion gap: 5 (ref 5–15)
BUN: 15 mg/dL (ref 8–23)
CO2: 23 mmol/L (ref 22–32)
Calcium: 9.2 mg/dL (ref 8.9–10.3)
Chloride: 111 mmol/L (ref 98–111)
Creatinine, Ser: 1.18 mg/dL — ABNORMAL HIGH (ref 0.44–1.00)
GFR, Estimated: 52 mL/min — ABNORMAL LOW (ref >60)
Glucose, Bld: 113 mg/dL — ABNORMAL HIGH (ref 70–99)
Potassium: 3.9 mmol/L (ref 3.5–5.1)
Sodium: 139 mmol/L (ref 135–145)
Total Bilirubin: 0.4 mg/dL (ref 0.3–1.2)
Total Protein: 6.8 g/dL (ref 6.5–8.1)

## 2021-12-16 LAB — MAGNESIUM: Magnesium: 1.3 mg/dL — ABNORMAL LOW (ref 1.7–2.4)

## 2021-12-16 MED ORDER — SODIUM CHLORIDE 0.9% FLUSH
10.0000 mL | INTRAVENOUS | Status: DC | PRN
  Administered 2021-12-16: 10 mL

## 2021-12-16 MED ORDER — SODIUM CHLORIDE 0.9 % IV SOLN
150.0000 mg | Freq: Once | INTRAVENOUS | Status: AC
  Administered 2021-12-16: 150 mg via INTRAVENOUS
  Filled 2021-12-16: qty 150

## 2021-12-16 MED ORDER — MAGNESIUM SULFATE 2 GM/50ML IV SOLN
2.0000 g | INTRAVENOUS | Status: AC
  Administered 2021-12-16 (×2): 2 g via INTRAVENOUS
  Filled 2021-12-16 (×2): qty 50

## 2021-12-16 MED ORDER — SODIUM CHLORIDE 0.9 % IV SOLN: Freq: Once | INTRAVENOUS | Status: AC

## 2021-12-16 MED ORDER — PALONOSETRON HCL INJECTION 0.25 MG/5ML
0.2500 mg | Freq: Once | INTRAVENOUS | Status: AC
  Administered 2021-12-16: 0.25 mg via INTRAVENOUS
  Filled 2021-12-16: qty 5

## 2021-12-16 MED ORDER — ALPRAZOLAM 0.25 MG PO TABS: 0.2500 mg | ORAL_TABLET | Freq: Two times a day (BID) | ORAL | 2 refills | Status: DC | PRN

## 2021-12-16 MED ORDER — SODIUM CHLORIDE 0.9 % IV SOLN
10.0000 mg | Freq: Once | INTRAVENOUS | Status: AC
  Administered 2021-12-16: 10 mg via INTRAVENOUS
  Filled 2021-12-16: qty 10

## 2021-12-16 MED ORDER — TRASTUZUMAB-ANNS CHEMO 150 MG IV SOLR
6.0000 mg/kg | Freq: Once | INTRAVENOUS | Status: AC
  Administered 2021-12-16: 462 mg via INTRAVENOUS
  Filled 2021-12-16: qty 22

## 2021-12-16 MED ORDER — SODIUM CHLORIDE 0.9 % IV SOLN
75.0000 mg/m2 | Freq: Once | INTRAVENOUS | Status: AC
  Administered 2021-12-16: 140 mg via INTRAVENOUS
  Filled 2021-12-16: qty 14

## 2021-12-16 MED ORDER — DIPHENHYDRAMINE HCL 25 MG PO CAPS
50.0000 mg | ORAL_CAPSULE | Freq: Once | ORAL | Status: AC
  Administered 2021-12-16: 50 mg via ORAL
  Filled 2021-12-16: qty 2

## 2021-12-16 MED ORDER — SODIUM CHLORIDE 0.9 % IV SOLN
480.0000 mg | Freq: Once | INTRAVENOUS | Status: AC
  Administered 2021-12-16: 480 mg via INTRAVENOUS
  Filled 2021-12-16: qty 48

## 2021-12-16 MED ORDER — ACETAMINOPHEN 325 MG PO TABS
650.0000 mg | ORAL_TABLET | Freq: Once | ORAL | Status: AC
  Administered 2021-12-16: 650 mg via ORAL
  Filled 2021-12-16: qty 2

## 2021-12-16 MED ORDER — SODIUM CHLORIDE 0.9 % IV SOLN
420.0000 mg | Freq: Once | INTRAVENOUS | Status: AC
  Administered 2021-12-16: 420 mg via INTRAVENOUS
  Filled 2021-12-16: qty 14

## 2021-12-16 MED ORDER — HEPARIN SOD (PORK) LOCK FLUSH 100 UNIT/ML IV SOLN
500.0000 [IU] | Freq: Once | INTRAVENOUS | Status: AC | PRN
  Administered 2021-12-16: 500 [IU]

## 2021-12-16 NOTE — Progress Notes (Signed)
? ?Traci Fuller ?[address] ?[place], [place] [address] ? ? ?CLINIC:  ?Medical Oncology/Hematology ? ?PCP:  ?Celene Squibb, MD ?[address] [place] [address] ?[phone] ? ? ?REASON FOR VISIT:  ?Follow-up for locally advanced left breast cancer ? ?PRIOR THERAPY: none ? ?NGS Results: not done ? ?CURRENT THERAPY: surveillance ? ?BRIEF ONCOLOGIC HISTORY:  ?Oncology History  ?Breast cancer, left ([place])  ?10/15/2021 Initial Diagnosis  ? Breast cancer, left ([place]) ? ?  ?11/04/2021 -  Chemotherapy  ? Patient is on Treatment Plan : BREAST  Docetaxel + Carboplatin + Trastuzumab + Pertuzumab  (TCHP) q21d   ? ?  ?  ? Genetic Testing  ? Negative genetic testing. No pathogenic variants identified on the [facility] Multi-Cancer+RNA panel. The report date is 11/02/2021.  ? ?The Multi-Cancer Panel + RNA offered by [facility] includes sequencing and/or deletion duplication testing of the following 84 genes: AIP, ALK, APC, ATM, AXIN2,BAP1,  BARD1, BLM, BMPR1A, BRCA1, BRCA2, BRIP1, CASR, CDC73, CDH1, CDK4, CDKN1B, CDKN1C, CDKN2A (p14ARF), CDKN2A (p16INK4a), CEBPA, CHEK2, CTNNA1, DICER1, DIS3L2, EGFR (c.2369C>T, p.Thr790Met variant only), EPCAM (Deletion/duplication testing only), FH, FLCN, GATA2, GPC3, GREM1 (Promoter region deletion/duplication testing only), HOXB13 (c.251G>A, p.Gly84Glu), HRAS, KIT, MAX, MEN1, MET, MITF (c.952G>A, p.Glu318Lys variant only), MLH1, MSH2, MSH3, MSH6, MUTYH, NBN, NF1, NF2, NTHL1, PALB2, PDGFRA, PHOX2B, PMS2, POLD1, POLE, POT1, PRKAR1A, PTCH1, PTEN, RAD50, RAD51C, RAD51D, RB1, RECQL4, RET, RUNX1, SDHAF2, SDHA (sequence changes only), SDHB, SDHC, SDHD, SMAD4, SMARCA4, SMARCB1, SMARCE1, STK11, SUFU, TERC, TERT, TMEM127, TP53, TSC1, TSC2, VHL, WRN and WT1. ?  ? ? ?CANCER STAGING: ? Cancer Staging  ?Breast cancer, left ([place]) ?Staging form: Breast, AJCC 8th Edition ?- Clinical stage from 10/15/2021: Stage IIA (cT3, cN1, cM0, G2, ER+, PR+, HER2+) - Unsigned ? ? ?INTERVAL HISTORY:  ?Traci Fuller, a 66 y.o. female, returns for routine follow-up and consideration for next cycle of chemotherapy. Anshi was last seen on 11/25/2021. ? ?Due for cycle #3 of TCHP today.  ? ?Overall, she tells me she has been feeling pretty well. She reports occasional nausea when eating, and she denies vomiting. She reports eating well. She has lost 2 lbs since her last visit. She reports a nosebleed when blowing her nose for 1 week. She reports tingling in her lips. She denies tingling/numbness in her hands or feet. The skin peeling on her hands and feet have improved with regular application or Aquaphor. She reports a headache from seasonal allergies, and she reports a history of seasonal allergies. She take 1 tablet of Xanax daily prn for sleep. She reports diarrhea since her last treatment. She denies skin rash. She denies mouth sores. She reports easy bruising.  ? ?Overall, she feels ready for next cycle of chemo today.  ? ?REVIEW OF SYSTEMS:  ?Review of Systems  ?Constitutional:  Positive for unexpected weight change (-2 lbs). Negative for appetite change and fatigue.  ?HENT:   Positive for nosebleeds. Negative for mouth sores.   ?Gastrointestinal:  Positive for diarrhea and nausea. Negative for vomiting.  ?Skin:  Negative for rash.  ?Neurological:  Positive for headaches and numbness (lips).  ?Hematological:  Bruises/bleeds easily.  ?Psychiatric/Behavioral:  Positive for sleep disturbance.   ?All other systems reviewed and are negative. ? ?PAST MEDICAL/SURGICAL HISTORY:  ?Past Medical History:  ?Diagnosis Date  ? CAD (coronary artery disease)   ? 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 Cancer [facility])   ? Current smoker   ?  Essential hypertension   ? Family history of bladder cancer   ? Family history of breast cancer   ? Family history of leukemia   ? Family history of uterine cancer   ? Hyperlipidemia LDL goal <70   ? Myocardial infarction [facility])   ? PONV (postoperative nausea and  vomiting)   ? Port-A-Cath in place 10/29/2021  ? ?Past Surgical History:  ?Procedure Laterality Date  ? ABDOMINAL HYSTERECTOMY    ? BREAST BIOPSY Bilateral 10/16/2021  ? CORONARY STENT INTERVENTION N/A 01/10/2018  ? Procedure: CORONARY STENT INTERVENTION;  Surgeon: Jettie Booze, MD;  Location: [place] [facility];  Service: Cardiovascular;  Laterality: N/A;  ? LEFT HEART CATH AND CORONARY ANGIOGRAPHY N/A 01/10/2018  ? Procedure: LEFT HEART CATH AND CORONARY ANGIOGRAPHY;  Surgeon: Jettie Booze, MD;  Location: [place] [facility];  Service: Cardiovascular;  Laterality: N/A;  ? PORTACATH PLACEMENT Right 10/26/2021  ? Procedure: INSERTION PORT-A-CATH;  Surgeon: Virl Cagey, MD;  Location: [facility];  Service: General;  Laterality: Right;  ? ? ?SOCIAL HISTORY:  ?Social History  ? ?Socioeconomic History  ? Marital status: Married  ?  Spouse name: Not on file  ? Number of children: Not on file  ? Years of education: Not on file  ? Highest education level: Not on file  ?Occupational History  ? Not on file  ?Tobacco Use  ? Smoking status: Every Day  ?  Packs/day: 1.00  ?  Years: 43.00  ?  Pack years: 43.00  ?  Types: Cigarettes  ?  Start date: 21  ? Smokeless tobacco: Never  ?Vaping Use  ? Vaping Use: Never used  ?Substance and Sexual Activity  ? Alcohol use: Not Currently  ? Drug use: Never  ? Sexual activity: Yes  ?Other Topics Concern  ? Not on file  ?Social History Narrative  ? Not on file  ? ?Social Determinants of Health  ? ?Financial Resource Strain: Not on file  ?Food Insecurity: Not on file  ?Transportation Needs: Not on file  ?Physical Activity: Not on file  ?Stress: Not on file  ?Social Connections: Not on file  ?Intimate Partner Violence: Not on file  ? ? ?FAMILY HISTORY:  ?Family History  ?Problem Relation Age of Onset  ? Bladder Cancer Mother 63  ? Cervical cancer Mother   ?     dx 27s, hysterectomy  ? Hypertension Father   ? Cirrhosis Father   ? Leukemia Brother 33  ? Liver cancer Brother 26  ?  Breast cancer Maternal Aunt   ?     dx 68s  ? Cancer Maternal Uncle   ?     unk type d. 17  ? Cancer Paternal Aunt   ?     unk type  ? Breast cancer Maternal Grandmother   ?     dx 80s  ? Uterine cancer Maternal Grandmother   ? ? ?CURRENT MEDICATIONS:  ?Current Outpatient Medications  ?Medication Sig Dispense Refill  ? ALPRAZolam (XANAX) 0.25 MG tablet Take 1 tablet (0.25 mg total) by mouth 2 (two) times daily as needed for anxiety. 60 tablet 0  ? amLODipine (NORVASC) 10 MG tablet Take 10 mg by mouth daily.    ? aspirin EC 81 MG EC tablet Take 1 tablet (81 mg total) by mouth daily. 90 tablet 3  ? CARBOPLATIN IV Inject into the vein every 21 ( twenty-one) days.    ? co-enzyme Q-10 30 MG capsule Take 30 mg by mouth  daily.    ? diphenoxylate-atropine (LOMOTIL) 2.5-0.025 MG tablet Take 1 tablet by mouth 4 (four) times daily as needed for diarrhea or loose stools. 60 tablet 3  ? DOCEtaxel (TAXOTERE IV) Inject into the vein every 21 ( twenty-one) days.    ? Evolocumab (REPATHA SURECLICK) 938 MG/ML SOAJ Inject 140 mg into the skin every 14 (fourteen) days. 2 mL 11  ? fluconazole (DIFLUCAN) 100 MG tablet Take 2 tablets today followed by 1 tablet daily for the next 4 days 6 tablet 0  ? gabapentin (NEURONTIN) 300 MG capsule Take 300 mg by mouth 2 (two) times daily.    ? levocetirizine (XYZAL) 5 MG tablet Take 5 mg by mouth every evening.    ? lidocaine (XYLOCAINE) 2 % solution Use as directed 15 mLs in the mouth or throat every 6 (six) hours as needed for mouth pain (Mix 5 ml with 5 ml of carafate -  swish and swallow). 100 mL 0  ? lidocaine-prilocaine (EMLA) cream Apply a small amount to port a cath site (do not rub in) and cover with plastic wrap 1 hour prior to chemotherapy appointments (Patient not taking: Reported on 11/25/2021) 30 g 3  ? magnesium oxide (MAG-OX) 400 (240 Mg) MG tablet Take 1 tablet (400 mg total) by mouth in the morning, at noon, and at bedtime. 90 tablet 3  ? metoprolol tartrate (LOPRESSOR) 25 MG tablet  Take 1 tablet (25 mg total) by mouth 2 (two) times daily. Please call and schedule overdue appointment with Dr. Gwenlyn Found for further refills . SECOND ATTEMPT! 30 tablet 0  ? omeprazole (PRILOSEC) 20 MG caps

## 2021-12-16 NOTE — Progress Notes (Signed)
Patient has been examined by Dr. Katragadda, and vital signs and labs have been reviewed. ANC, Creatinine, LFTs, hemoglobin, and platelets are within treatment parameters per M.D. - pt may proceed with treatment.    °

## 2021-12-16 NOTE — Progress Notes (Signed)
Patients port flushed without difficulty.  Good blood return noted with no bruising or swelling noted at site.  Patient remains accessed for chemotherapy treatment.  

## 2021-12-16 NOTE — Patient Instructions (Signed)
[place] at [facility] ?Discharge Instructions ? ? ?You were seen and examined today by Dr. Delton Coombes. ? ?He reviewed your lab work.  All results are stable with exception of your magnesium. It is low.  We will give you IV magnesium in clinic today. Increase taking your magnesium at home to three times a day. ? ?We will proceed with your treatment today.  ? ?Return as scheduled in 3 weeks.  ? ? ?Thank you for choosing [place] at [facility] to provide your oncology and hematology care.  To afford each patient quality time with our provider, please arrive at least 15 minutes before your scheduled appointment time.  ? ?If you have a lab appointment with the [facility] please come in thru the Main Entrance and check in at the main information desk. ? ?You need to re-schedule your appointment should you arrive 10 or more minutes late.  We strive to give you quality time with our providers, and arriving late affects you and other patients whose appointments are after yours.  Also, if you no show three or more times for appointments you may be dismissed from the clinic at the providers discretion.     ?Again, thank you for choosing [facility].  Our hope is that these requests will decrease the amount of time that you wait before being seen by our physicians.       ?_____________________________________________________________ ? ?Should you have questions after your visit to [facility], please contact our office at [phone] and follow the prompts.  Our office hours are 8:00 a.m. and 4:30 p.m. Monday - Friday.  Please note that voicemails left after 4:00 p.m. may not be returned until the following business day.  We are closed weekends and major holidays.  You do have access to a nurse 24-7, just call the main number to the clinic [phone] and do not press any options, hold on the line and a nurse will answer the phone.   ? ?For  prescription refill requests, have your pharmacy contact our office and allow 72 hours.   ? ?Due to Covid, you will need to wear a mask upon entering the hospital. If you do not have a mask, a mask will be given to you at the Main Entrance upon arrival. For doctor visits, patients may have 1 support person age 90 or older with them. For treatment visits, patients can not have anyone with them due to social distancing guidelines and our immunocompromised population.  ? ?   ?

## 2021-12-16 NOTE — Progress Notes (Signed)
Patient presents today for Kajinti/Perjeta/Taxotere/Carboplatin infusion per providers order.  Vital signs within parameters for treatment.  Labs pending.  Patient states that she feels good and has only had a little nausea. ? ?Labs within parameters for treatment.  Message received from [place]?Dr. Delton Coombes patient is okay for treatment. ? ?Taxotere/Carboplatin given today per MD orders.  Stable during infusion without adverse affects.  Vital signs stable.  No complaints at this time.  Discharge from clinic ambulatory in stable condition.  Alert and oriented X 3.  Follow up with [facility] as scheduled.  ?

## 2021-12-16 NOTE — Patient Instructions (Signed)
[place]  Discharge Instructions: ?Thank you for choosing [place] to provide your oncology and hematology care.  ?If you have a lab appointment with the [facility], please come in thru the Main Entrance and check in at the main information desk. ? ?Wear comfortable clothing and clothing appropriate for easy access to any Portacath or PICC line.  ? ?We strive to give you quality time with your provider. You may need to reschedule your appointment if you arrive late (15 or more minutes).  Arriving late affects you and other patients whose appointments are after yours.  Also, if you miss three or more appointments without notifying the office, you may be dismissed from the clinic at the provider?s discretion.    ?  ?For prescription refill requests, have your pharmacy contact our office and allow 72 hours for refills to be completed.   ? ?Today you received the following chemotherapy and/or immunotherapy agents Taxotere/Carboplatin    ?  ?To help prevent nausea and vomiting after your treatment, we encourage you to take your nausea medication as directed. ? ?BELOW ARE SYMPTOMS THAT SHOULD BE REPORTED IMMEDIATELY: ?*FEVER GREATER THAN 100.4 F (38 ?C) OR HIGHER ?*CHILLS OR SWEATING ?*NAUSEA AND VOMITING THAT IS NOT CONTROLLED WITH YOUR NAUSEA MEDICATION ?*UNUSUAL SHORTNESS OF BREATH ?*UNUSUAL BRUISING OR BLEEDING ?*URINARY PROBLEMS (pain or burning when urinating, or frequent urination) ?*BOWEL PROBLEMS (unusual diarrhea, constipation, pain near the anus) ?TENDERNESS IN MOUTH AND THROAT WITH OR WITHOUT PRESENCE OF ULCERS (sore throat, sores in mouth, or a toothache) ?UNUSUAL RASH, SWELLING OR PAIN  ?UNUSUAL VAGINAL DISCHARGE OR ITCHING  ? ?Items with * indicate a potential emergency and should be followed up as soon as possible or go to the Emergency Department if any problems should occur. ? ?Please show the CHEMOTHERAPY ALERT CARD or IMMUNOTHERAPY ALERT CARD at check-in to the  Emergency Department and triage nurse. ? ?Should you have questions after your visit or need to cancel or reschedule your appointment, please contact [facility] [phone]  and follow the prompts.  Office hours are 8:00 a.m. to 4:30 p.m. Monday - Friday. Please note that voicemails left after 4:00 p.m. may not be returned until the following business day.  We are closed weekends and major holidays. You have access to a nurse at all times for urgent questions. Please call the main number to the clinic [phone] and follow the prompts. ? ?For any non-urgent questions, you may also contact your provider using MyChart. We now offer e-Visits for anyone 73 and older to request care online for non-urgent symptoms. For details visit [email or web address]. ?  ?Also download the MyChart app! Go to the app store, search "MyChart", open the app, select [place], and log in with your MyChart username and password. ? ?Due to Covid, a mask is required upon entering the hospital/clinic. If you do not have a mask, one will be given to you upon arrival. For doctor visits, patients may have 1 support person aged 55 or older with them. For treatment visits, patients cannot have anyone with them due to current Covid guidelines and our immunocompromised population.  ?

## 2021-12-18 ENCOUNTER — Ambulatory Visit (HOSPITAL_COMMUNITY): Payer: Managed Care, Other (non HMO)

## 2021-12-21 ENCOUNTER — Other Ambulatory Visit (HOSPITAL_COMMUNITY): Payer: Self-pay | Admitting: Hematology

## 2021-12-21 ENCOUNTER — Other Ambulatory Visit (HOSPITAL_COMMUNITY): Payer: Self-pay

## 2021-12-21 ENCOUNTER — Inpatient Hospital Stay (HOSPITAL_COMMUNITY): Payer: Managed Care, Other (non HMO) | Attending: Hematology

## 2021-12-21 VITALS — BP 123/70 | HR 88 | Temp 99.0°F | Resp 18

## 2021-12-21 DIAGNOSIS — Z5111 Encounter for antineoplastic chemotherapy: Secondary | ICD-10-CM | POA: Diagnosis present

## 2021-12-21 DIAGNOSIS — Z170 Estrogen receptor positive status [ER+]: Secondary | ICD-10-CM | POA: Insufficient documentation

## 2021-12-21 DIAGNOSIS — F419 Anxiety disorder, unspecified: Secondary | ICD-10-CM | POA: Insufficient documentation

## 2021-12-21 DIAGNOSIS — I10 Essential (primary) hypertension: Secondary | ICD-10-CM | POA: Insufficient documentation

## 2021-12-21 DIAGNOSIS — C50812 Malignant neoplasm of overlapping sites of left female breast: Secondary | ICD-10-CM | POA: Diagnosis present

## 2021-12-21 DIAGNOSIS — Z79899 Other long term (current) drug therapy: Secondary | ICD-10-CM | POA: Insufficient documentation

## 2021-12-21 LAB — COMPREHENSIVE METABOLIC PANEL
ALT: 28 U/L (ref 0–44)
AST: 22 U/L (ref 15–41)
Albumin: 3.6 g/dL (ref 3.5–5.0)
Alkaline Phosphatase: 260 U/L — ABNORMAL HIGH (ref 38–126)
Anion gap: 7 (ref 5–15)
BUN: 24 mg/dL — ABNORMAL HIGH (ref 8–23)
CO2: 23 mmol/L (ref 22–32)
Calcium: 9.7 mg/dL (ref 8.9–10.3)
Chloride: 107 mmol/L (ref 98–111)
Creatinine, Ser: 1.18 mg/dL — ABNORMAL HIGH (ref 0.44–1.00)
GFR, Estimated: 52 mL/min — ABNORMAL LOW (ref >60)
Glucose, Bld: 119 mg/dL — ABNORMAL HIGH (ref 70–99)
Potassium: 3.9 mmol/L (ref 3.5–5.1)
Sodium: 137 mmol/L (ref 135–145)
Total Bilirubin: 0.6 mg/dL (ref 0.3–1.2)
Total Protein: 7.2 g/dL (ref 6.5–8.1)

## 2021-12-21 LAB — CBC WITH DIFFERENTIAL/PLATELET
Basophils Absolute: 0 10*3/uL (ref 0.0–0.1)
Basophils Relative: 0 %
Eosinophils Absolute: 0.1 10*3/uL (ref 0.0–0.5)
Eosinophils Relative: 1 %
HCT: 31 % — ABNORMAL LOW (ref 36.0–46.0)
Hemoglobin: 9.8 g/dL — ABNORMAL LOW (ref 12.0–15.0)
Lymphocytes Relative: 13 %
Lymphs Abs: 1.4 10*3/uL (ref 0.7–4.0)
MCH: 31.5 pg (ref 26.0–34.0)
MCHC: 31.6 g/dL (ref 30.0–36.0)
MCV: 99.7 fL (ref 80.0–100.0)
Metamyelocytes Relative: 3 %
Monocytes Absolute: 0.1 10*3/uL (ref 0.1–1.0)
Monocytes Relative: 1 %
Myelocytes: 2 %
Neutro Abs: 8.5 10*3/uL — ABNORMAL HIGH (ref 1.7–7.7)
Neutrophils Relative %: 80 %
Platelets: 198 10*3/uL (ref 150–400)
RBC: 3.11 MIL/uL — ABNORMAL LOW (ref 3.87–5.11)
RDW: 19.2 % — ABNORMAL HIGH (ref 11.5–15.5)
WBC: 10.6 10*3/uL — ABNORMAL HIGH (ref 4.0–10.5)
nRBC: 0 % (ref 0.0–0.2)

## 2021-12-21 LAB — MAGNESIUM: Magnesium: 2.1 mg/dL (ref 1.7–2.4)

## 2021-12-21 MED ORDER — SODIUM CHLORIDE 0.9% FLUSH
10.0000 mL | Freq: Once | INTRAVENOUS | Status: AC | PRN
  Administered 2021-12-21: 10 mL

## 2021-12-21 MED ORDER — DIPHENOXYLATE-ATROPINE 2.5-0.025 MG PO TABS
2.0000 | ORAL_TABLET | Freq: Once | ORAL | Status: AC
  Administered 2021-12-21: 2 via ORAL
  Filled 2021-12-21: qty 2

## 2021-12-21 MED ORDER — SODIUM CHLORIDE 0.9 % IV SOLN
Freq: Once | INTRAVENOUS | Status: AC
  Filled 2021-12-21: qty 4

## 2021-12-21 MED ORDER — HEPARIN SOD (PORK) LOCK FLUSH 100 UNIT/ML IV SOLN
500.0000 [IU] | Freq: Once | INTRAVENOUS | Status: AC | PRN
  Administered 2021-12-21: 500 [IU]

## 2021-12-21 MED ORDER — MAGNESIUM SULFATE 2 GM/50ML IV SOLN
2.0000 g | Freq: Once | INTRAVENOUS | Status: AC
  Administered 2021-12-21: 2 g via INTRAVENOUS
  Filled 2021-12-21: qty 50

## 2021-12-21 MED ORDER — DIPHENOXYLATE-ATROPINE 2.5-0.025 MG PO TABS: 2.0000 | ORAL_TABLET | Freq: Four times a day (QID) | ORAL | 3 refills | Status: DC | PRN

## 2021-12-21 MED ORDER — POTASSIUM CHLORIDE IN NACL 20-0.9 MEQ/L-% IV SOLN
Freq: Once | INTRAVENOUS | Status: AC
  Filled 2021-12-21: qty 1000

## 2021-12-21 MED ORDER — SODIUM CHLORIDE 0.9 % IV SOLN: INTRAVENOUS | Status: DC

## 2021-12-21 NOTE — Progress Notes (Signed)
Patient presents today for Hydration, Zofran '8mg'$  IV, Lomotil PO 2 tablets. Patient has complaints of nausea, diarrhea not relieved by Imodium over the weekend.  ? ?Hydration given today per MD orders. Tolerated infusion without adverse affects. Vital signs stable. No complaints at this time. Discharged from clinic ambulatory in stable condition. Alert and oriented x 3. F/U with [facility] as scheduled.   ?

## 2021-12-21 NOTE — Patient Instructions (Signed)
[facility]  Discharge Instructions: ?Thank you for choosing [facility] to provide your oncology and hematology care.  ?If you have a lab appointment with the [facility], please come in thru the Main Entrance and check in at the main information desk. ? ?Wear comfortable clothing and clothing appropriate for easy access to any Portacath or PICC line.  ? ?We strive to give you quality time with your provider. You may need to reschedule your appointment if you arrive late (15 or more minutes).  Arriving late affects you and other patients whose appointments are after yours.  Also, if you miss three or more appointments without notifying the office, you may be dismissed from the clinic at the provider?s discretion.    ?  ?For prescription refill requests, have your pharmacy contact our office and allow 72 hours for refills to be completed.   ? ?Today you received the following chemotherapy and/or immunotherapy agents Hydration , Zofran '8mg'$  IV and 2 tablets by mouth of Lomotil.     ?  ?To help prevent nausea and vomiting after your treatment, we encourage you to take your nausea medication as directed. ? ?BELOW ARE SYMPTOMS THAT SHOULD BE REPORTED IMMEDIATELY: ?*FEVER GREATER THAN 100.4 F (38 ?C) OR HIGHER ?*CHILLS OR SWEATING ?*NAUSEA AND VOMITING THAT IS NOT CONTROLLED WITH YOUR NAUSEA MEDICATION ?*UNUSUAL SHORTNESS OF BREATH ?*UNUSUAL BRUISING OR BLEEDING ?*URINARY PROBLEMS (pain or burning when urinating, or frequent urination) ?*BOWEL PROBLEMS (unusual diarrhea, constipation, pain near the anus) ?TENDERNESS IN MOUTH AND THROAT WITH OR WITHOUT PRESENCE OF ULCERS (sore throat, sores in mouth, or a toothache) ?UNUSUAL RASH, SWELLING OR PAIN  ?UNUSUAL VAGINAL DISCHARGE OR ITCHING  ? ?Items with * indicate a potential emergency and should be followed up as soon as possible or go to the Emergency Department if any problems should occur. ? ?Please show the CHEMOTHERAPY ALERT CARD or  IMMUNOTHERAPY ALERT CARD at check-in to the Emergency Department and triage nurse. ? ?Should you have questions after your visit or need to cancel or reschedule your appointment, please contact [facility] [phone]  and follow the prompts.  Office hours are 8:00 a.m. to 4:30 p.m. Monday - Friday. Please note that voicemails left after 4:00 p.m. may not be returned until the following business day.  We are closed weekends and major holidays. You have access to a nurse at all times for urgent questions. Please call the main number to the clinic [phone] and follow the prompts. ? ?For any non-urgent questions, you may also contact your provider using MyChart. We now offer e-Visits for anyone 59 and older to request care online for non-urgent symptoms. For details visit [email or web address]. ?  ?Also download the MyChart app! Go to the app store, search "MyChart", open the app, select [facility], and log in with your MyChart username and password. ? ?Due to Covid, a mask is required upon entering the hospital/clinic. If you do not have a mask, one will be given to you upon arrival. For doctor visits, patients may have 1 support person aged 72 or older with them. For treatment visits, patients cannot have anyone with them due to current Covid guidelines and our immunocompromised population.  ?

## 2021-12-29 ENCOUNTER — Other Ambulatory Visit (HOSPITAL_COMMUNITY): Payer: Self-pay | Admitting: Hematology

## 2021-12-30 ENCOUNTER — Other Ambulatory Visit: Payer: Self-pay

## 2021-12-30 DIAGNOSIS — E785 Hyperlipidemia, unspecified: Secondary | ICD-10-CM

## 2021-12-31 ENCOUNTER — Encounter (HOSPITAL_COMMUNITY): Payer: Self-pay

## 2021-12-31 ENCOUNTER — Other Ambulatory Visit (HOSPITAL_COMMUNITY): Payer: Self-pay

## 2021-12-31 DIAGNOSIS — Z170 Estrogen receptor positive status [ER+]: Secondary | ICD-10-CM

## 2021-12-31 NOTE — Progress Notes (Signed)
Patient's daughter [name] called stating that patient has been having diarrhea multiple times and that she is having weakness and has fallen. Patient has been taking Lomotil 2 tablets and Imodium but is not helping. Provider Tarri Abernethy, PA-C made aware and recommends visit with her, labs, and fluids. Appointments made for tomorrow.  ? ?Patient's daughter aware and agreeable with plan. She is aware that if she gets worse this evening that she needs to go to the emergency department.  ?

## 2021-12-31 NOTE — Progress Notes (Signed)
[facility] ?[address] ?[place], [place] [address] ?Phone: [phone] ?Fax: [phone] ? ?SYMPTOMS MANAGEMENT CLINIC PROGRESS NOTE  ? ?Traci Fuller ?[id number] ?November 04, 1955 ?66 y.o. ? ?Traci Fuller is managed by Dr. 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 ? ?Actively treated with chemotherapy/immunotherapy/hormonal therapy: YES ? ?Current therapy:  TCHP (docetaxel + carboplatin + trastuzumab + pertuzumab) every 21 days ? ?Last treated: 12/16/2021 (cycle #3) ? ?Next scheduled appointment with provider: 01/06/2022 (due for cycle #4) ? ?Subjective:  ?Chief Complaint: Diarrhea, weakness, falls ?Per LPN Note (08/25/129):  "Patient's daughter Traci Fuller called stating that patient has been having diarrhea multiple times and that she is having weakness and has fallen. Patient has been taking Lomotil 2 tablets and Imodium but is not helping" ? ?Traci Fuller is managed by Dr. Delton Coombes for her oligometastatic breast cancer.  She is on [facility] every 21 days, last treated on 12/16/2021.  She is accompanied today by her daughter, Traci Fuller. ? ?Patient has been having severe diarrhea for the past week, with 6-8 watery yellow stools per day.  No abdominal pain, hematochezia, fever, or chills.  She has been taking the maximum dose of Lomotil (2 tablets every 6 hours) plus as needed Imodium in between doses of Lomotil, but without relief of her diarrhea.  This has been further complicated by nausea and vomiting, and she reports that she has vomited up her pills on several occasions.  For her nausea and vomiting, she is taking Compazine every 6 hours, and has been taking some of her daughter's sublingual Zofran tablets, but without any significant benefit. ? ?Oral intake has been extremely poor over the past week.  She reports that her only "food" yesterday was a popsicle, but that she "vomited it right back up."  She has drunk very  little water, and has vomited water on some occasions as well. ? ?She reports extreme weakness and fatigue is progressive over the past week.  She fell 2 days ago and reports that she hit her head.  She denies any headaches or neurologic deficits.  No syncopal episodes, but she does have lightheadedness with standing.  She usually takes amlodipine and metoprolol at home, but she has been holding these medications for the past several days due to low blood pressure at home.  Blood pressure today is 110/71. ? ? ?Review of Systems:  ?Review of Systems  ?Constitutional:  Positive for activity change, appetite change, fatigue and unexpected weight change. Negative for chills, diaphoresis and fever.  ?HENT:  Negative for mouth sores, nosebleeds, sore throat and trouble swallowing.   ?Respiratory:  Negative for cough and shortness of breath.   ?Cardiovascular:  Positive for palpitations. Negative for chest pain and leg swelling.  ?Gastrointestinal:  Positive for diarrhea, nausea and vomiting. Negative for abdominal pain, blood in stool and constipation.  ?Genitourinary:  Negative for dysuria and hematuria.  ?Neurological:  Positive for dizziness, weakness, light-headedness and headaches. Negative for numbness.  ?Psychiatric/Behavioral:  Negative for dysphoric mood and sleep disturbance. The patient is not nervous/anxious.   ? ? ?Past Medical History, Surgical history, Social history, and Family history were reviewed as documented elsewhere in chart, and were updated as appropriate.  ? ?Assessment & Plan:    ?1.  Dehydration with acute kidney injury ?- Secondary to chemo-induced diarrhea, nausea, and vomiting as below ?- Evidenced by marginal BP 110/71, borderline tachycardia 94 ?- Creatinine today is 3.08/BUN 46, significantly increased from creatinine 1.18  on 12/21/2021 ?- PLAN: 1 L IV fluids given in clinic. ?- Due to AKI and current inability to tolerate adequate oral intake, we will admit patient to hospital for ongoing  IV fluids and symptom management. ? ?2.  Hypokalemia ?- Potassium today 2.0 ?- Likely secondary to GI losses from diarrhea and vomiting ?- PLAN: 40 mEq oral potassium +40 mg IV potassium given in clinic today ?- Additional potassium per hospitalists ? ?3.  Diarrhea, chemo-induced ?- Diarrhea has been complicated by the fact that she has not been able to keep down her antidiarrheal medications ?- PLAN: Once patient is able to tolerate oral intake and keep her medication down, would recommend continuing Lomotil 2 tablets every 6 hours with Imodium as needed in between. ?- Since she has been admitted to the hospital for supportive care, it would be reasonable to check stool cultures to rule out infectious etiology. ? ?4.  Nausea and vomiting, chemo-induced ?- Unable to tolerate oral intake ?- Little relief from Compazine or Zofran ?- PLAN: Patient will receive IV antiemetics while hospitalized ?- Continue Compazine and Zofran, we will reassess at next visit and consider trying on alternative agent ? ?5.  Locally advanced oligometastatic left breast cancer ?- Primary oncologist is Dr. Delton Coombes ?- She received cycle #3 of TCHP (docetaxel + carboplatin + trastuzumab + pertuzumab) on 12/16/2021  ?- PLAN: Next scheduled appointment with Dr. Delton Coombes is 01/06/2022 ? ? ? ?Objective:  ? ?Physical Exam:  ?There were no vitals taken for this visit. ?ECOG: 3 ? ?Physical Exam ?Constitutional:   ?   Appearance: Normal appearance. She is obese.  ?HENT:  ?   Head: Normocephalic and atraumatic.  ?   Mouth/Throat:  ?   Mouth: Mucous membranes are moist.  ?Eyes:  ?   Extraocular Movements: Extraocular movements intact.  ?   Pupils: Pupils are equal, round, and reactive to light.  ?Cardiovascular:  ?   Rate and Rhythm: Regular rhythm. Tachycardia present.  ?   Pulses: Normal pulses.  ?   Heart sounds: Normal heart sounds.  ?Pulmonary:  ?   Effort: Pulmonary effort is normal.  ?   Breath sounds: Normal breath sounds.  ?Abdominal:  ?    General: Bowel sounds are normal.  ?   Palpations: Abdomen is soft.  ?   Tenderness: There is no abdominal tenderness.  ?Musculoskeletal:     ?   General: No swelling.  ?   Right lower leg: No edema.  ?   Left lower leg: No edema.  ?Lymphadenopathy:  ?   Cervical: No cervical adenopathy.  ?Skin: ?   General: Skin is warm and dry.  ?   Comments: Dry, poor turgor  ?Neurological:  ?   General: No focal deficit present.  ?   Mental Status: She is alert and oriented to person, place, and time.  ?Psychiatric:     ?   Mood and Affect: Mood normal.     ?   Behavior: Behavior normal.  ? ? ?Lab Review:  ?   ?Component Value Date/Time  ? NA 137 12/21/2021 0927  ? NA 142 01/19/2018 1224  ? K 3.9 12/21/2021 0927  ? CL 107 12/21/2021 0927  ? CO2 23 12/21/2021 0927  ? GLUCOSE 119 (H) 12/21/2021 5176  ? BUN 24 (H) 12/21/2021 1607  ? BUN 10 01/19/2018 1224  ? CREATININE 1.18 (H) 12/21/2021 3710  ? CALCIUM 9.7 12/21/2021 0927  ? PROT 7.2 12/21/2021 0927  ? PROT 6.6 03/09/2018 0758  ?  ALBUMIN 3.6 12/21/2021 0927  ? ALBUMIN 4.1 03/09/2018 0758  ? AST 22 12/21/2021 0927  ? ALT 28 12/21/2021 0927  ? ALKPHOS 260 (H) 12/21/2021 0254  ? BILITOT 0.6 12/21/2021 0927  ? BILITOT 0.6 03/09/2018 0758  ? GFRNONAA 52 (L) 12/21/2021 8628  ? GFRAA 91 01/19/2018 1224  ? ? ?   ?Component Value Date/Time  ? WBC 10.6 (H) 12/21/2021 2417  ? RBC 3.11 (L) 12/21/2021 5301  ? HGB 9.8 (L) 12/21/2021 0927  ? HCT 31.0 (L) 12/21/2021 0927  ? PLT 198 12/21/2021 0927  ? MCV 99.7 12/21/2021 0927  ? MCH 31.5 12/21/2021 0927  ? MCHC 31.6 12/21/2021 0927  ? RDW 19.2 (H) 12/21/2021 0404  ? LYMPHSABS 1.4 12/21/2021 0927  ? MONOABS 0.1 12/21/2021 0927  ? EOSABS 0.1 12/21/2021 0927  ? BASOSABS 0.0 12/21/2021 0927  ? ?------------------------------- ? ?Imaging from last 24 hours (if applicable): ? ?Radiology interpretation: ?No results found.  ? ? ?Wrap-Up:    ?All questions were answered. The patient knows to call the clinic with any problems, questions or  concerns. ? ?Medical decision making: High ? ?Time spent on visit: I spent 40 minutes counseling the patient face to face. The total time spent in the appointment was 55 minutes and more than 50% was on counseling. ?

## 2022-01-01 ENCOUNTER — Encounter (HOSPITAL_COMMUNITY): Payer: Self-pay | Admitting: Internal Medicine

## 2022-01-01 ENCOUNTER — Observation Stay
Admission: AD | Admit: 2022-01-01 | Payer: Managed Care, Other (non HMO) | Source: Ambulatory Visit | Admitting: Internal Medicine

## 2022-01-01 ENCOUNTER — Inpatient Hospital Stay (HOSPITAL_COMMUNITY): Payer: Managed Care, Other (non HMO)

## 2022-01-01 ENCOUNTER — Inpatient Hospital Stay (HOSPITAL_BASED_OUTPATIENT_CLINIC_OR_DEPARTMENT_OTHER): Payer: Managed Care, Other (non HMO) | Admitting: Physician Assistant

## 2022-01-01 ENCOUNTER — Other Ambulatory Visit: Payer: Self-pay

## 2022-01-01 ENCOUNTER — Inpatient Hospital Stay (HOSPITAL_COMMUNITY)
Admission: AD | Admit: 2022-01-01 | Discharge: 2022-01-07 | DRG: 683 | Disposition: A | Discharge status: Home / Self Care | Payer: Managed Care, Other (non HMO) | Source: Ambulatory Visit | Attending: Internal Medicine | Admitting: Internal Medicine

## 2022-01-01 VITALS — BP 110/71 | HR 94 | Temp 98.3°F | Resp 16

## 2022-01-01 DIAGNOSIS — Z170 Estrogen receptor positive status [ER+]: Secondary | ICD-10-CM

## 2022-01-01 DIAGNOSIS — T451X5A Adverse effect of antineoplastic and immunosuppressive drugs, initial encounter: Secondary | ICD-10-CM | POA: Diagnosis present

## 2022-01-01 DIAGNOSIS — Z8049 Family history of malignant neoplasm of other genital organs: Secondary | ICD-10-CM | POA: Diagnosis not present

## 2022-01-01 DIAGNOSIS — Z7982 Long term (current) use of aspirin: Secondary | ICD-10-CM

## 2022-01-01 DIAGNOSIS — N1832 Chronic kidney disease, stage 3b: Secondary | ICD-10-CM | POA: Diagnosis present

## 2022-01-01 DIAGNOSIS — R54 Age-related physical debility: Secondary | ICD-10-CM | POA: Diagnosis present

## 2022-01-01 DIAGNOSIS — D696 Thrombocytopenia, unspecified: Secondary | ICD-10-CM | POA: Diagnosis present

## 2022-01-01 DIAGNOSIS — E785 Hyperlipidemia, unspecified: Secondary | ICD-10-CM | POA: Diagnosis present

## 2022-01-01 DIAGNOSIS — I252 Old myocardial infarction: Secondary | ICD-10-CM

## 2022-01-01 DIAGNOSIS — Y92239 Unspecified place in hospital as the place of occurrence of the external cause: Secondary | ICD-10-CM | POA: Diagnosis present

## 2022-01-01 DIAGNOSIS — F17210 Nicotine dependence, cigarettes, uncomplicated: Secondary | ICD-10-CM | POA: Diagnosis present

## 2022-01-01 DIAGNOSIS — B9620 Unspecified Escherichia coli [E. coli] as the cause of diseases classified elsewhere: Secondary | ICD-10-CM | POA: Diagnosis present

## 2022-01-01 DIAGNOSIS — R112 Nausea with vomiting, unspecified: Secondary | ICD-10-CM | POA: Diagnosis not present

## 2022-01-01 DIAGNOSIS — Z806 Family history of leukemia: Secondary | ICD-10-CM | POA: Diagnosis not present

## 2022-01-01 DIAGNOSIS — I129 Hypertensive chronic kidney disease with stage 1 through stage 4 chronic kidney disease, or unspecified chronic kidney disease: Secondary | ICD-10-CM | POA: Diagnosis present

## 2022-01-01 DIAGNOSIS — Z955 Presence of coronary angioplasty implant and graft: Secondary | ICD-10-CM

## 2022-01-01 DIAGNOSIS — C50912 Malignant neoplasm of unspecified site of left female breast: Secondary | ICD-10-CM | POA: Diagnosis present

## 2022-01-01 DIAGNOSIS — E860 Dehydration: Secondary | ICD-10-CM | POA: Diagnosis present

## 2022-01-01 DIAGNOSIS — I2510 Atherosclerotic heart disease of native coronary artery without angina pectoris: Secondary | ICD-10-CM

## 2022-01-01 DIAGNOSIS — N170 Acute kidney failure with tubular necrosis: Secondary | ICD-10-CM | POA: Diagnosis not present

## 2022-01-01 DIAGNOSIS — D630 Anemia in neoplastic disease: Secondary | ICD-10-CM | POA: Diagnosis present

## 2022-01-01 DIAGNOSIS — D72829 Elevated white blood cell count, unspecified: Secondary | ICD-10-CM | POA: Diagnosis present

## 2022-01-01 DIAGNOSIS — K521 Toxic gastroenteritis and colitis: Secondary | ICD-10-CM

## 2022-01-01 DIAGNOSIS — Z716 Tobacco abuse counseling: Secondary | ICD-10-CM | POA: Diagnosis not present

## 2022-01-01 DIAGNOSIS — D631 Anemia in chronic kidney disease: Secondary | ICD-10-CM | POA: Diagnosis present

## 2022-01-01 DIAGNOSIS — N189 Chronic kidney disease, unspecified: Principal | ICD-10-CM | POA: Diagnosis present

## 2022-01-01 DIAGNOSIS — Z79899 Other long term (current) drug therapy: Secondary | ICD-10-CM

## 2022-01-01 DIAGNOSIS — F17200 Nicotine dependence, unspecified, uncomplicated: Secondary | ICD-10-CM | POA: Diagnosis present

## 2022-01-01 DIAGNOSIS — E876 Hypokalemia: Secondary | ICD-10-CM | POA: Diagnosis present

## 2022-01-01 DIAGNOSIS — I10 Essential (primary) hypertension: Secondary | ICD-10-CM | POA: Diagnosis present

## 2022-01-01 DIAGNOSIS — Z800 Family history of malignant neoplasm of digestive organs: Secondary | ICD-10-CM

## 2022-01-01 DIAGNOSIS — E872 Acidosis, unspecified: Secondary | ICD-10-CM | POA: Diagnosis present

## 2022-01-01 DIAGNOSIS — Z8052 Family history of malignant neoplasm of bladder: Secondary | ICD-10-CM | POA: Diagnosis not present

## 2022-01-01 DIAGNOSIS — Z803 Family history of malignant neoplasm of breast: Secondary | ICD-10-CM

## 2022-01-01 DIAGNOSIS — N390 Urinary tract infection, site not specified: Secondary | ICD-10-CM

## 2022-01-01 DIAGNOSIS — Z8249 Family history of ischemic heart disease and other diseases of the circulatory system: Secondary | ICD-10-CM

## 2022-01-01 DIAGNOSIS — Z9861 Coronary angioplasty status: Secondary | ICD-10-CM | POA: Diagnosis not present

## 2022-01-01 DIAGNOSIS — N342 Other urethritis: Secondary | ICD-10-CM | POA: Diagnosis not present

## 2022-01-01 DIAGNOSIS — C50812 Malignant neoplasm of overlapping sites of left female breast: Secondary | ICD-10-CM

## 2022-01-01 DIAGNOSIS — R197 Diarrhea, unspecified: Secondary | ICD-10-CM | POA: Diagnosis present

## 2022-01-01 DIAGNOSIS — N179 Acute kidney failure, unspecified: Secondary | ICD-10-CM

## 2022-01-01 DIAGNOSIS — N1831 Chronic kidney disease, stage 3a: Secondary | ICD-10-CM | POA: Diagnosis not present

## 2022-01-01 DIAGNOSIS — K6289 Other specified diseases of anus and rectum: Secondary | ICD-10-CM | POA: Diagnosis present

## 2022-01-01 DIAGNOSIS — Z90710 Acquired absence of both cervix and uterus: Secondary | ICD-10-CM

## 2022-01-01 DIAGNOSIS — N3000 Acute cystitis without hematuria: Secondary | ICD-10-CM | POA: Diagnosis not present

## 2022-01-01 DIAGNOSIS — R109 Unspecified abdominal pain: Secondary | ICD-10-CM | POA: Diagnosis not present

## 2022-01-01 DIAGNOSIS — R7989 Other specified abnormal findings of blood chemistry: Secondary | ICD-10-CM | POA: Diagnosis present

## 2022-01-01 LAB — CBC
HCT: 30.9 % — ABNORMAL LOW (ref 36.0–46.0)
Hemoglobin: 10.3 g/dL — ABNORMAL LOW (ref 12.0–15.0)
MCH: 33.1 pg (ref 26.0–34.0)
MCHC: 33.3 g/dL (ref 30.0–36.0)
MCV: 99.4 fL (ref 80.0–100.0)
Platelets: 79 10*3/uL — ABNORMAL LOW (ref 150–400)
RBC: 3.11 MIL/uL — ABNORMAL LOW (ref 3.87–5.11)
RDW: 22.3 % — ABNORMAL HIGH (ref 11.5–15.5)
WBC: 12.6 10*3/uL — ABNORMAL HIGH (ref 4.0–10.5)
nRBC: 0.2 % (ref 0.0–0.2)

## 2022-01-01 LAB — HIV ANTIBODY (ROUTINE TESTING W REFLEX): HIV Screen 4th Generation wRfx: NONREACTIVE

## 2022-01-01 LAB — COMPREHENSIVE METABOLIC PANEL
ALT: 20 U/L (ref 0–44)
AST: 18 U/L (ref 15–41)
Albumin: 4.1 g/dL (ref 3.5–5.0)
Alkaline Phosphatase: 246 U/L — ABNORMAL HIGH (ref 38–126)
Anion gap: 12 (ref 5–15)
BUN: 46 mg/dL — ABNORMAL HIGH (ref 8–23)
CO2: 20 mmol/L — ABNORMAL LOW (ref 22–32)
Calcium: 9.9 mg/dL (ref 8.9–10.3)
Chloride: 104 mmol/L (ref 98–111)
Creatinine, Ser: 3.08 mg/dL — ABNORMAL HIGH (ref 0.44–1.00)
GFR, Estimated: 16 mL/min — ABNORMAL LOW (ref >60)
Glucose, Bld: 159 mg/dL — ABNORMAL HIGH (ref 70–99)
Potassium: 2 mmol/L — CL (ref 3.5–5.1)
Sodium: 136 mmol/L (ref 135–145)
Total Bilirubin: 0.8 mg/dL (ref 0.3–1.2)
Total Protein: 7.5 g/dL (ref 6.5–8.1)

## 2022-01-01 LAB — LIPID PANEL
Cholesterol: 122 mg/dL (ref 0–200)
HDL: 32 mg/dL — ABNORMAL LOW (ref >40)
LDL Cholesterol: 26 mg/dL (ref 0–99)
Total CHOL/HDL Ratio: 3.8 RATIO
Triglycerides: 321 mg/dL — ABNORMAL HIGH (ref <150)
VLDL: 64 mg/dL — ABNORMAL HIGH (ref 0–40)

## 2022-01-01 LAB — TSH: TSH: 0.697 u[IU]/mL (ref 0.350–4.500)

## 2022-01-01 LAB — MAGNESIUM: Magnesium: 1.9 mg/dL (ref 1.7–2.4)

## 2022-01-01 LAB — PHOSPHORUS: Phosphorus: 3.1 mg/dL (ref 2.5–4.6)

## 2022-01-01 MED ORDER — LACTATED RINGERS IV SOLN: INTRAVENOUS | Status: DC

## 2022-01-01 MED ORDER — ASPIRIN EC 81 MG PO TBEC
81.0000 mg | DELAYED_RELEASE_TABLET | Freq: Every day | ORAL | Status: DC
  Administered 2022-01-02 – 2022-01-07 (×6): 81 mg via ORAL
  Filled 2022-01-01 (×6): qty 1

## 2022-01-01 MED ORDER — HEPARIN SOD (PORK) LOCK FLUSH 100 UNIT/ML IV SOLN: 500.0000 [IU] | Freq: Once | INTRAVENOUS | Status: DC | PRN

## 2022-01-01 MED ORDER — ACETAMINOPHEN 325 MG PO TABS
650.0000 mg | ORAL_TABLET | Freq: Four times a day (QID) | ORAL | Status: DC | PRN
  Filled 2022-01-01: qty 2

## 2022-01-01 MED ORDER — POTASSIUM CHLORIDE IN NACL 40-0.9 MEQ/L-% IV SOLN
Freq: Once | INTRAVENOUS | Status: AC
  Filled 2022-01-01: qty 1000

## 2022-01-01 MED ORDER — DIPHENOXYLATE-ATROPINE 2.5-0.025 MG PO TABS
2.0000 | ORAL_TABLET | Freq: Four times a day (QID) | ORAL | Status: DC | PRN
  Administered 2022-01-02 – 2022-01-03 (×3): 2 via ORAL
  Filled 2022-01-01 (×4): qty 2

## 2022-01-01 MED ORDER — METOPROLOL TARTRATE 25 MG PO TABS
25.0000 mg | ORAL_TABLET | Freq: Two times a day (BID) | ORAL | Status: DC
  Filled 2022-01-01 (×2): qty 1

## 2022-01-01 MED ORDER — ONDANSETRON HCL 4 MG/2ML IJ SOLN
4.0000 mg | Freq: Four times a day (QID) | INTRAMUSCULAR | Status: DC | PRN
  Administered 2022-01-01 – 2022-01-06 (×5): 4 mg via INTRAVENOUS
  Filled 2022-01-01 (×6): qty 2

## 2022-01-01 MED ORDER — ACETAMINOPHEN 650 MG RE SUPP: 650.0000 mg | Freq: Four times a day (QID) | RECTAL | Status: DC | PRN

## 2022-01-01 MED ORDER — POTASSIUM CHLORIDE 10 MEQ/100ML IV SOLN: 10.0000 meq | INTRAVENOUS | Status: AC

## 2022-01-01 MED ORDER — PANTOPRAZOLE SODIUM 40 MG PO TBEC
40.0000 mg | DELAYED_RELEASE_TABLET | Freq: Every day | ORAL | Status: DC
Start: 2022-01-01 — End: 2022-01-05
  Administered 2022-01-01 – 2022-01-05 (×5): 40 mg via ORAL
  Filled 2022-01-01 (×5): qty 1

## 2022-01-01 MED ORDER — POTASSIUM CHLORIDE IN NACL 20-0.9 MEQ/L-% IV SOLN
Freq: Once | INTRAVENOUS | Status: AC
  Filled 2022-01-01: qty 1000

## 2022-01-01 MED ORDER — POTASSIUM CHLORIDE IN NACL 20-0.9 MEQ/L-% IV SOLN
Freq: Once | INTRAVENOUS | Status: DC
  Filled 2022-01-01: qty 1000

## 2022-01-01 MED ORDER — POTASSIUM CHLORIDE CRYS ER 20 MEQ PO TBCR
40.0000 meq | EXTENDED_RELEASE_TABLET | Freq: Once | ORAL | Status: AC
  Administered 2022-01-01: 40 meq via ORAL
  Filled 2022-01-01: qty 2

## 2022-01-01 MED ORDER — TICAGRELOR 90 MG PO TABS
90.0000 mg | ORAL_TABLET | Freq: Two times a day (BID) | ORAL | Status: DC
Start: 2022-01-01 — End: 2022-01-02
  Administered 2022-01-01: 90 mg via ORAL
  Filled 2022-01-01 (×2): qty 1

## 2022-01-01 MED ORDER — SODIUM CHLORIDE 0.9 % IV SOLN
Freq: Once | INTRAVENOUS | Status: DC
  Filled 2022-01-01: qty 4

## 2022-01-01 MED ORDER — GABAPENTIN 300 MG PO CAPS
300.0000 mg | ORAL_CAPSULE | Freq: Every day | ORAL | Status: DC
  Administered 2022-01-01 – 2022-01-06 (×6): 300 mg via ORAL
  Filled 2022-01-01 (×6): qty 1

## 2022-01-01 MED ORDER — ALPRAZOLAM 0.25 MG PO TABS
0.2500 mg | ORAL_TABLET | Freq: Two times a day (BID) | ORAL | Status: DC | PRN
  Administered 2022-01-01 – 2022-01-06 (×6): 0.25 mg via ORAL
  Filled 2022-01-01 (×6): qty 1

## 2022-01-01 MED ORDER — SODIUM CHLORIDE 0.9% FLUSH: 10.0000 mL | Freq: Once | INTRAVENOUS | Status: DC | PRN

## 2022-01-01 MED ORDER — ONDANSETRON HCL 4 MG PO TABS: 4.0000 mg | ORAL_TABLET | Freq: Four times a day (QID) | ORAL | Status: DC | PRN

## 2022-01-01 MED ORDER — PROCHLORPERAZINE EDISYLATE 10 MG/2ML IJ SOLN
10.0000 mg | Freq: Four times a day (QID) | INTRAMUSCULAR | Status: DC | PRN
  Administered 2022-01-03: 10 mg via INTRAVENOUS
  Filled 2022-01-01: qty 2

## 2022-01-01 MED ORDER — MAGNESIUM SULFATE 2 GM/50ML IV SOLN
2.0000 g | Freq: Once | INTRAVENOUS | Status: AC
  Administered 2022-01-01: 2 g via INTRAVENOUS
  Filled 2022-01-01: qty 50

## 2022-01-01 MED ORDER — ONDANSETRON HCL 4 MG/2ML IJ SOLN
8.0000 mg | Freq: Once | INTRAMUSCULAR | Status: AC
  Administered 2022-01-01: 8 mg via INTRAVENOUS
  Filled 2022-01-01: qty 4

## 2022-01-01 MED ORDER — OXYCODONE HCL 5 MG PO TABS: 5.0000 mg | ORAL_TABLET | Freq: Four times a day (QID) | ORAL | Status: DC | PRN

## 2022-01-01 NOTE — Assessment & Plan Note (Addendum)
-  Cessation counseling provided -Nicotine patch ordered. 

## 2022-01-01 NOTE — Patient Instructions (Signed)
[facility]  Discharge Instructions: Thank you for choosing [facility] to provide your oncology and hematology care.  If you have a lab appointment with the Cancer Center, please come in thru the Main Entrance and check in at the main information desk.  Wear comfortable clothing and clothing appropriate for easy access to any Portacath or PICC line.   We strive to give you quality time with your provider. You may need to reschedule your appointment if you arrive late (15 or more minutes).  Arriving late affects you and other patients whose appointments are after yours.  Also, if you miss three or more appointments without notifying the office, you may be dismissed from the clinic at the provider's discretion.      For prescription refill requests, have your pharmacy contact our office and allow 72 hours for refills to be completed.        To help prevent nausea and vomiting after your treatment, we encourage you to take your nausea medication as directed.  BELOW ARE SYMPTOMS THAT SHOULD BE REPORTED IMMEDIATELY: *FEVER GREATER THAN 100.4 F (38 C) OR HIGHER *CHILLS OR SWEATING *NAUSEA AND VOMITING THAT IS NOT CONTROLLED WITH YOUR NAUSEA MEDICATION *UNUSUAL SHORTNESS OF BREATH *UNUSUAL BRUISING OR BLEEDING *URINARY PROBLEMS (pain or burning when urinating, or frequent urination) *BOWEL PROBLEMS (unusual diarrhea, constipation, pain near the anus) TENDERNESS IN MOUTH AND THROAT WITH OR WITHOUT PRESENCE OF ULCERS (sore throat, sores in mouth, or a toothache) UNUSUAL RASH, SWELLING OR PAIN  UNUSUAL VAGINAL DISCHARGE OR ITCHING   Items with * indicate a potential emergency and should be followed up as soon as possible or go to the Emergency Department if any problems should occur.  Please show the CHEMOTHERAPY ALERT CARD or IMMUNOTHERAPY ALERT CARD at check-in to the Emergency Department and triage nurse.  Should you have questions after your visit or need to cancel  or reschedule your appointment, please contact [facility] [phone]  and follow the prompts.  Office hours are 8:00 a.m. to 4:30 p.m. Monday - Friday. Please note that voicemails left after 4:00 p.m. may not be returned until the following business day.  We are closed weekends and major holidays. You have access to a nurse at all times for urgent questions. Please call the main number to the clinic [phone] and follow the prompts.  For any non-urgent questions, you may also contact your provider using MyChart. We now offer e-Visits for anyone 18 and older to request care online for non-urgent symptoms. For details visit [email or web address].   Also download the MyChart app! Go to the app store, search "MyChart", open the app, select [facility], and log in with your MyChart username and password.  Due to Covid, a mask is required upon entering the hospital/clinic. If you do not have a mask, one will be given to you upon arrival. For doctor visits, patients may have 1 support person aged 18 or older with them. For treatment visits, patients cannot have anyone with them due to current Covid guidelines and our immunocompromised population.  

## 2022-01-01 NOTE — Plan of Care (Signed)

## 2022-01-01 NOTE — Assessment & Plan Note (Addendum)
-  Stable overall -initially held amlodipine and metoprolol due to soft BPs -restart metoprolol -hold amlodipine and monitor BPs at home

## 2022-01-01 NOTE — Assessment & Plan Note (Addendum)
-  Appears to be secondary to stress demargination and hemoconcentration from dehydration ?-WBCs within normal limits after fluid resuscitation initiated. ?-Continue to follow trend. ?

## 2022-01-01 NOTE — Assessment & Plan Note (Addendum)
-   Patient has completed 3 cycles of chemotherapy so far. ?-Continue outpatient follow-up with oncology service. ?-Stage IIa-III, locally advanced oligometastatic left breast cancer (cT3, CN I, cM1, G2, ER positive, PR positive, HER 2+). ?

## 2022-01-01 NOTE — Progress Notes (Signed)
CRITICAL VALUE ALERT ?Critical value received:  potassium 2.0 ?Date of notification:  01/01/2022 ?Time of notification: 10:05 am .  ?Critical value read back:  Yes.   ?Nurse who received alert:  B. Keina Mutch RN ?MD notified time and response:  R. Pennington PA. Patient to receive 20 mEq potassium IV with 40 mEq PO pre and post IV potassium.   ?

## 2022-01-01 NOTE — H&P (Signed)
?History and Physical  ? ? ?Patient: Traci Fuller YIR:[id number] DOB: [birth year] ?DOA: 01/01/2022 ?DOS: the patient was seen and examined on 01/01/2022 ?PCP: Celene Squibb, MD  ?Patient coming from: Home ? ?Chief Complaint: No chief complaint on file. ? ?HPI: Traci Fuller is a 66 y.o. female with medical history significant of hypertension, hyperlipidemia, prior history of coronary artery disease with myocardial infarction, chronic kidney disease stage IIIb, breast cancer and tobacco abuse; who presented to the hospital from cancer center secondary to intractable nausea, vomiting, diarrhea and inability to keep things down.  Patient reports no hematemesis, hematochezia or melena.  Symptoms appears to be related to a side effect from ongoing chemotherapy.  Patient is afebrile. ? ?Initial work-up demonstrated mild leukocytosis with concern for hemoconcentration, acute on chronic renal failure with a creatinine up to 3.08, severe hypokalemia and per patient active ongoing GI losses symptoms.  She has failed to respond to oral antiemetics (Compazine and Zofran) and antidiarrheal treatment (Lomotil/Imodium) as an outpatient.  TRH has been contacted to place in the hospital for further evaluation and management. ? ?Review of Systems: As mentioned in the history of present illness. All other systems reviewed and are negative. ?Past Medical History:  ?Diagnosis Date  ? CAD (coronary artery disease)   ? 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 Cancer [facility])   ? Current smoker   ? Essential hypertension   ? Family history of bladder cancer   ? Family history of breast cancer   ? Family history of leukemia   ? Family history of uterine cancer   ? Hyperlipidemia LDL goal <70   ? Myocardial infarction [facility])   ? PONV (postoperative nausea and vomiting)   ? Port-A-Cath in place 10/29/2021  ? ?Past Surgical History:  ?Procedure Laterality Date  ? ABDOMINAL HYSTERECTOMY    ? BREAST BIOPSY  Bilateral 10/16/2021  ? CORONARY STENT INTERVENTION N/A 01/10/2018  ? Procedure: CORONARY STENT INTERVENTION;  Surgeon: Jettie Booze, MD;  Location: [facility];  Service: Cardiovascular;  Laterality: N/A;  ? LEFT HEART CATH AND CORONARY ANGIOGRAPHY N/A 01/10/2018  ? Procedure: LEFT HEART CATH AND CORONARY ANGIOGRAPHY;  Surgeon: Jettie Booze, MD;  Location: [place] [facility];  Service: Cardiovascular;  Laterality: N/A;  ? PORTACATH PLACEMENT Right 10/26/2021  ? Procedure: INSERTION PORT-A-CATH;  Surgeon: Virl Cagey, MD;  Location: [facility];  Service: General;  Laterality: Right;  ? ?Social History:  reports that she has been smoking cigarettes. She started smoking about 47 years ago. She has a 43.00 pack-year smoking history. She has never used smokeless tobacco. She reports that she does not currently use alcohol. She reports that she does not use drugs. ? ?No Known Allergies ? ?Family History  ?Problem Relation Age of Onset  ? Bladder Cancer Mother 66  ? Cervical cancer Mother   ?     dx 12s, hysterectomy  ? Hypertension Father   ? Cirrhosis Father   ? Leukemia Brother 32  ? Liver cancer Brother 7  ? Breast cancer Maternal Aunt   ?     dx 66s  ? Cancer Maternal Uncle   ?     unk type d. 21  ? Cancer Paternal Aunt   ?     unk type  ? Breast cancer Maternal Grandmother   ?     dx 59s  ? Uterine cancer Maternal Grandmother   ? ? ?Prior to  Admission medications   ?Medication Sig Start Date End Date Taking? Authorizing Provider  ?ALPRAZolam (XANAX) 0.25 MG tablet Take 1 tablet (0.25 mg total) by mouth 2 (two) times daily as needed for anxiety. 12/16/21   Derek Jack, MD  ?amLODipine (NORVASC) 10 MG tablet Take 10 mg by mouth daily. 09/20/21   [provider]  ?aspirin EC 81 MG EC tablet Take 1 tablet (81 mg total) by mouth daily. 01/12/18   Ledora Bottcher, PA  ?CARBOPLATIN IV Inject into the vein every 21 ( twenty-one) days. 11/04/21   [provider]  ?co-enzyme  Q-10 30 MG capsule Take 30 mg by mouth daily.    [provider]  ?diphenoxylate-atropine (LOMOTIL) 2.5-0.025 MG tablet Take 1 tablet by mouth 4 (four) times daily as needed for diarrhea or loose stools. 10/28/21   Derek Jack, MD  ?diphenoxylate-atropine (LOMOTIL) 2.5-0.025 MG tablet Take 2 tablets by mouth 4 (four) times daily as needed for diarrhea or loose stools. 12/21/21   Derek Jack, MD  ?DOCEtaxel (TAXOTERE IV) Inject into the vein every 21 ( twenty-one) days. 11/04/21   [provider]  ?Evolocumab (REPATHA SURECLICK) 497 MG/ML SOAJ Inject 140 mg into the skin every 14 (fourteen) days. 10/16/21   Swinyer, Lanice Schwab, NP  ?fluconazole (DIFLUCAN) 100 MG tablet Take 2 tablets today followed by 1 tablet daily for the next 4 days 11/10/21   Derek Jack, MD  ?gabapentin (NEURONTIN) 300 MG capsule Take 300 mg by mouth 2 (two) times daily. 08/31/21   [provider]  ?levocetirizine (XYZAL) 5 MG tablet Take 5 mg by mouth every evening.    [provider]  ?lidocaine (XYLOCAINE) 2 % solution Use as directed 15 mLs in the mouth or throat every 6 (six) hours as needed for mouth pain (Mix 5 ml with 5 ml of carafate -  swish and swallow). 11/10/21   Derek Jack, MD  ?lidocaine-prilocaine (EMLA) cream Apply a small amount to port a cath site (do not rub in) and cover with plastic wrap 1 hour prior to chemotherapy appointments ?Patient not taking: Reported on 01/01/2022 10/29/21   Derek Jack, MD  ?magnesium oxide (MAG-OX) 400 (240 Mg) MG tablet Take 1 tablet (400 mg total) by mouth in the morning, at noon, and at bedtime. 11/25/21   Derek Jack, MD  ?metoprolol tartrate (LOPRESSOR) 25 MG tablet Take 1 tablet (25 mg total) by mouth 2 (two) times daily. Please call and schedule overdue appointment with Dr. Gwenlyn Found for further refills . SECOND ATTEMPT! 10/08/19   Lorretta Harp, MD  ?omeprazole (PRILOSEC) 20 MG capsule Take 20 mg by mouth daily.     [provider]  ?ondansetron (ZOFRAN) 4 MG tablet Take 1 tablet (4 mg total) by mouth every 8 (eight) hours as needed. ?Patient not taking: Reported on 01/01/2022 10/26/21 10/26/22  Virl Cagey, MD  ?oxyCODONE (ROXICODONE) 5 MG immediate release tablet Take 1 tablet (5 mg total) by mouth every 4 (four) hours as needed for severe pain or breakthrough pain. 10/26/21 10/26/22  Virl Cagey, MD  ?Pertuzumab (PERJETA IV) Inject into the vein every 21 ( twenty-one) days. 11/04/21   [provider]  ?prochlorperazine (COMPAZINE) 10 MG tablet Take 1 tablet (10 mg total) by mouth every 6 (six) hours as needed (Nausea or vomiting). ?Patient not taking: Reported on 01/01/2022 10/29/21   Derek Jack, MD  ?sucralfate (CARAFATE) 1 GM/10ML suspension MIX 5MLS WITH 5 MLS OF 2% LIDOCAINE. SWISH AND SWALLOW EVERY 6  HOURS AS NEEDED 12/29/21   Derek Jack, MD  ?ticagrelor (BRILINTA) 90 MG TABS tablet Take 1 tablet (90 mg total) by mouth 2 (two) times daily. 12/18/18   Ledora Bottcher, PA  ?Trastuzumab (HERCEPTIN IV) Inject into the vein every 21 ( twenty-one) days. 11/04/21   [provider]  ? ? ?Physical Exam: ?Vitals:  ? 01/01/22 1500 01/01/22 1633 01/01/22 1657  ?BP: 123/66  123/66  ?Pulse: 79  79  ?Resp: 16  16  ?Temp:   98.3 ?F (36.8 ?C)  ?TempSrc:   Oral  ?SpO2: 100%    ?Weight:  67.6 kg 67.6 kg  ?Height:   '5\' 1"'$  (1.549 m)  ? ?General exam: Alert, awake, oriented x 3; frail and chronically ill in appearance.  Reports decreased appetite and not feeling well. ?Respiratory system: Clear to auscultation. Respiratory effort normal.  No requiring oxygen supplementation. ?Cardiovascular system: RRR. No rubs, gallops or JVD. ?Gastrointestinal system: Abdomen is nondistended, soft and nontender. No organomegaly or masses felt. Normal bowel sounds heard. ?Central nervous system: Alert and oriented. No focal neurological deficits. ?Extremities: No cyanosis or clubbing. ?Skin: No  petechiae. ?Psychiatry: Judgement and insight appear normal. Mood & affect appropriate.  ? ?Data Reviewed: ?CBC demonstrating WBCs of 12.6, hemoglobin 10.3, platelet count 79K ?Lipid panel demonstrating triglycerides of 3

## 2022-01-01 NOTE — Assessment & Plan Note (Addendum)
-  Appears to be secondary to side effects from chemotherapy>>overall improved at time of d/c -stool pathogen panel--neg -Patient is afebrile and reports no abdominal pain, dysuria, hematuria or any other complaints to suggest active infection. -After fluid resuscitation WBCs within normal range.  Patient denies abdominal pain. -5/16 CT abd--fluid in colon and rectum, stranding in perirectal fat plane with patchy density between sacrum and rectum -general surgery consult--discussed with Dr. Turner Daniels was present on PET in 10/2021--continue observation and surveillance scans as outpt

## 2022-01-01 NOTE — Assessment & Plan Note (Addendum)
-  In the setting of dehydration from diarrhea and acute on chronic renal failure ?-NAG metabolic acidosis due to diarrhea ?-added sodium bicarbonate infusion; and follow trend.  While taking serum bicarbonate by mouth patient be came really sick and started to vomiting. ?-bicarb improved>>d/c bicarb drip ?

## 2022-01-01 NOTE — Assessment & Plan Note (Addendum)
-  No overt bleeding appreciated ?-Use of chemotherapy most likely contributing to patient anemia ?-Hemoglobin 8.8; after receiving 1 unit PRBC transfusion on 01/03/2022. ?-Follow response/stability and hemoglobin trend. ?

## 2022-01-01 NOTE — Assessment & Plan Note (Addendum)
-  In the setting of GI losses and decreased oral intake ?-Safe to discontinue telemetry at this point.  Continue to follow electrolytes trend intermittently.  ?Replete as needed ? ?

## 2022-01-01 NOTE — Assessment & Plan Note (Addendum)
-  In the setting of decreased ability to maintain adequate oral hydration due to uncontrolled nausea/vomiting and diarrhea. -Continue fluid resuscitation, as needed antiemetics, the use of Lomotil and as needed Imodium. -improved at time of d/c

## 2022-01-01 NOTE — Assessment & Plan Note (Addendum)
-  No chest pain or shortness of breath -Continue with the use of aspirin. -Due to thrombocytopenia Brilinta has been placed on hold>>restart since platelets improved to 222 on day of dc -restart metoprolol with improved BP

## 2022-01-01 NOTE — Assessment & Plan Note (Addendum)
-  Patient with a stage IIIb at baseline; presenting with worsening renal function and acute kidney injury in the setting of prerenal azotemia and dehydration. -baseline creatinine 1.1-1.4 -Serum creatinine peaked 3.08 -Urinalysis without suspicious for UTI; but patient reporting some discomfort while urinating.  Urine will be checked for cultures. -Renal ultrasound demonstrating no obstructive uropathy and normal degree of bladder distention. -improving with IVF>>serum creatinine 1.21 at time of dc

## 2022-01-01 NOTE — Assessment & Plan Note (Addendum)
-  Continue the use of Repatha as an outpatient. ?

## 2022-01-01 NOTE — Assessment & Plan Note (Addendum)
-  Avoid the use of heparin products ?-SCDs for DVT prophylaxis. ?-No overt bleeding appreciated; follow platelet count trend. ?-Platelet count 131 K. ?-No signs of overt bleeding. ?

## 2022-01-01 NOTE — Progress Notes (Signed)
Patient presents today for IV fluids.  Patient is not feeling well today.  She complains of nausea and vomiting mostly.  She is also having decreased appetite and weakness.  Vital signs are stable.  Labs reviewed.  Potassium is 2.0 today.  Creatinine is 3.08.  Tarri Abernethy, PA-C was notified.  We will give 40 mEq of potassium PO, 40 mEq potassium in the house fluids IV, and an additional 20 mEq potassium IV post fluids per Rebekah.  We will proceed as ordered.   ? ?Per Tarri Abernethy, we will give the last 20 mEq of potassium in another L of NS to run at 200 mL/hr.  Patient will be direct admitted to [facility] per provider.  Dr. Dyann Kief is the hospitalist accepting her care.  [facility] notified. ? ?Patient was transported to [facility] at 1530.  Report was given to Katherine, [place].  Patient was stable at transport with no new complaints voiced.  ?

## 2022-01-02 ENCOUNTER — Inpatient Hospital Stay (HOSPITAL_COMMUNITY): Payer: Managed Care, Other (non HMO)

## 2022-01-02 DIAGNOSIS — I2510 Atherosclerotic heart disease of native coronary artery without angina pectoris: Secondary | ICD-10-CM | POA: Diagnosis not present

## 2022-01-02 DIAGNOSIS — E860 Dehydration: Secondary | ICD-10-CM | POA: Diagnosis not present

## 2022-01-02 DIAGNOSIS — C50812 Malignant neoplasm of overlapping sites of left female breast: Secondary | ICD-10-CM | POA: Diagnosis not present

## 2022-01-02 DIAGNOSIS — N179 Acute kidney failure, unspecified: Secondary | ICD-10-CM | POA: Diagnosis not present

## 2022-01-02 LAB — COMPREHENSIVE METABOLIC PANEL
ALT: 15 U/L (ref 0–44)
AST: 12 U/L — ABNORMAL LOW (ref 15–41)
Albumin: 3.2 g/dL — ABNORMAL LOW (ref 3.5–5.0)
Alkaline Phosphatase: 170 U/L — ABNORMAL HIGH (ref 38–126)
Anion gap: 6 (ref 5–15)
BUN: 39 mg/dL — ABNORMAL HIGH (ref 8–23)
CO2: 19 mmol/L — ABNORMAL LOW (ref 22–32)
Calcium: 9.1 mg/dL (ref 8.9–10.3)
Chloride: 113 mmol/L — ABNORMAL HIGH (ref 98–111)
Creatinine, Ser: 2.21 mg/dL — ABNORMAL HIGH (ref 0.44–1.00)
GFR, Estimated: 24 mL/min — ABNORMAL LOW (ref >60)
Glucose, Bld: 110 mg/dL — ABNORMAL HIGH (ref 70–99)
Potassium: 2.6 mmol/L — CL (ref 3.5–5.1)
Sodium: 138 mmol/L (ref 135–145)
Total Bilirubin: 0.6 mg/dL (ref 0.3–1.2)
Total Protein: 5.8 g/dL — ABNORMAL LOW (ref 6.5–8.1)

## 2022-01-02 LAB — URINALYSIS, ROUTINE W REFLEX MICROSCOPIC
Bilirubin Urine: NEGATIVE
Glucose, UA: NEGATIVE mg/dL
Hgb urine dipstick: NEGATIVE
Ketones, ur: 5 mg/dL — AB
Nitrite: NEGATIVE
Protein, ur: 100 mg/dL — AB
Specific Gravity, Urine: 1.018 (ref 1.005–1.030)
pH: 5 (ref 5.0–8.0)

## 2022-01-02 LAB — CBC
HCT: 22.1 % — ABNORMAL LOW (ref 36.0–46.0)
HCT: 22.1 % — ABNORMAL LOW (ref 36.0–46.0)
Hemoglobin: 7.2 g/dL — ABNORMAL LOW (ref 12.0–15.0)
Hemoglobin: 7.1 g/dL — ABNORMAL LOW (ref 12.0–15.0)
MCH: 33.3 pg (ref 26.0–34.0)
MCH: 33 pg (ref 26.0–34.0)
MCHC: 32.6 g/dL (ref 30.0–36.0)
MCHC: 32.1 g/dL (ref 30.0–36.0)
MCV: 102.3 fL — ABNORMAL HIGH (ref 80.0–100.0)
MCV: 102.8 fL — ABNORMAL HIGH (ref 80.0–100.0)
Platelets: 70 10*3/uL — ABNORMAL LOW (ref 150–400)
Platelets: 74 10*3/uL — ABNORMAL LOW (ref 150–400)
RBC: 2.16 MIL/uL — ABNORMAL LOW (ref 3.87–5.11)
RBC: 2.15 MIL/uL — ABNORMAL LOW (ref 3.87–5.11)
RDW: 22.5 % — ABNORMAL HIGH (ref 11.5–15.5)
RDW: 22.6 % — ABNORMAL HIGH (ref 11.5–15.5)
WBC: 9.4 10*3/uL (ref 4.0–10.5)
WBC: 9.2 10*3/uL (ref 4.0–10.5)
nRBC: 0 % (ref 0.0–0.2)
nRBC: 0 % (ref 0.0–0.2)

## 2022-01-02 IMAGING — US US RENAL
1 series · 14 of 25 positions shown · non-contrast
Comparison: PET-CT [DATE]

CLINICAL DATA: Acute renal insufficiency.  Breast cancer.

EXAM:
RENAL / URINARY TRACT ULTRASOUND COMPLETE

[Series 1: us renal · 14 of 33 slices shown]
[im 1/33]
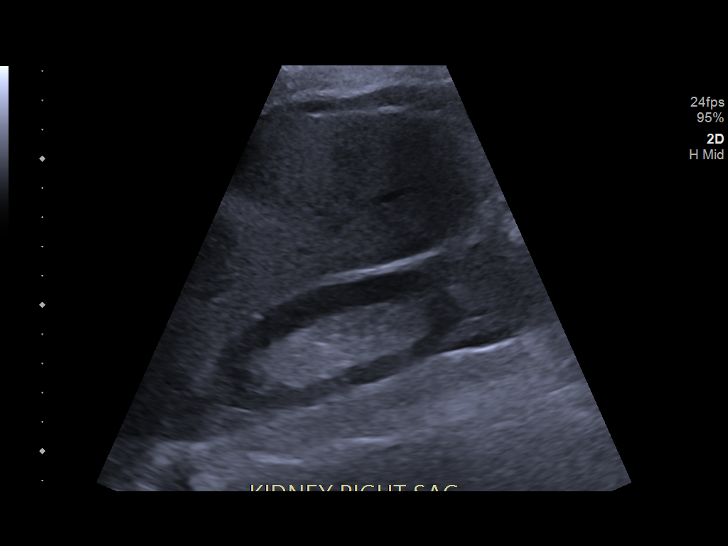
[im 3/33]
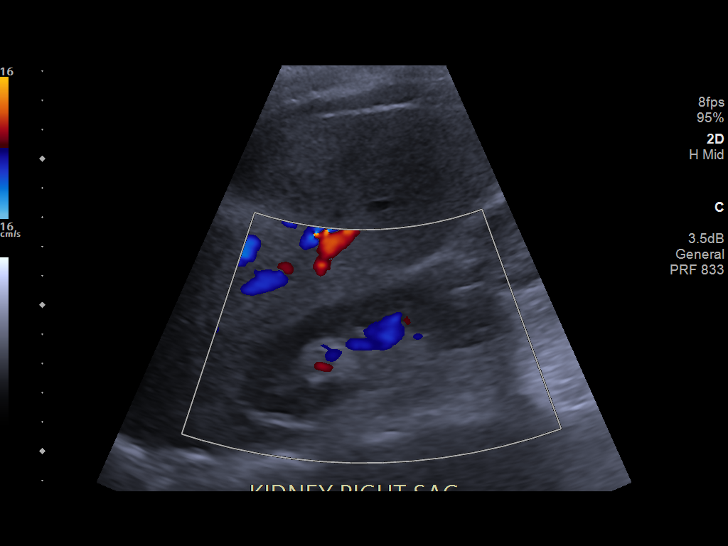
[im 6/33]
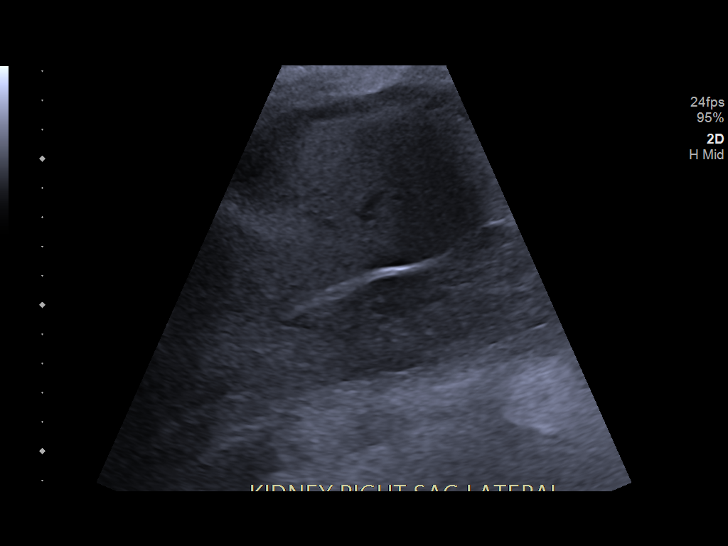
[im 9/33]
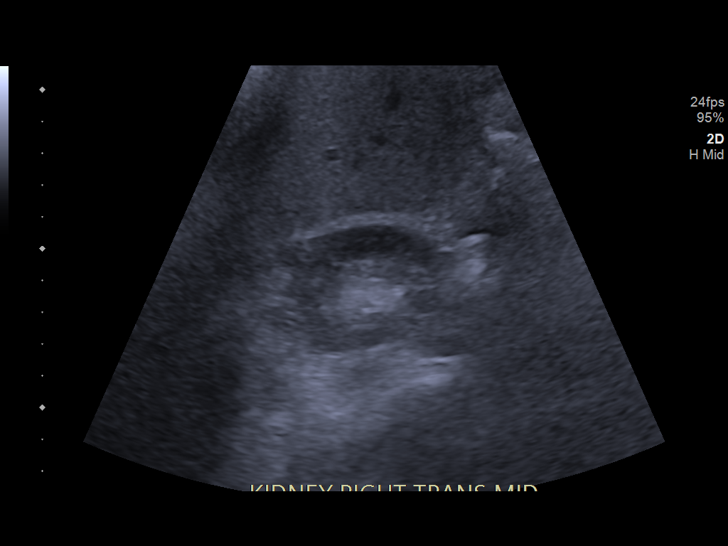
[im 11/33]
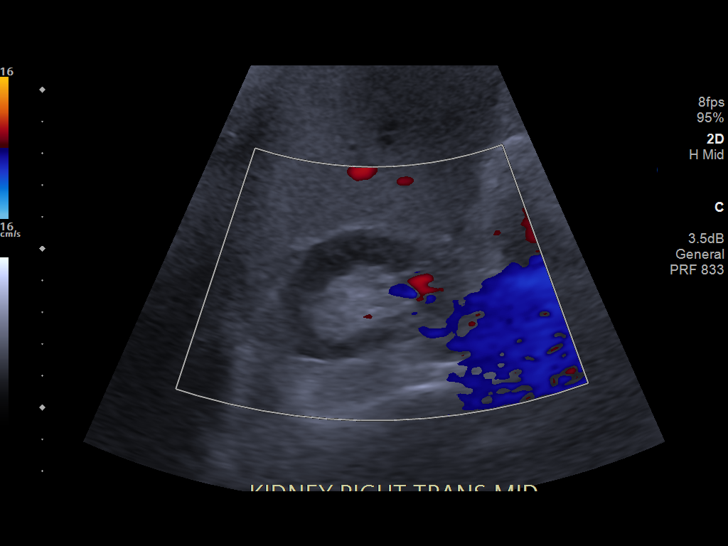
[im 13/33]
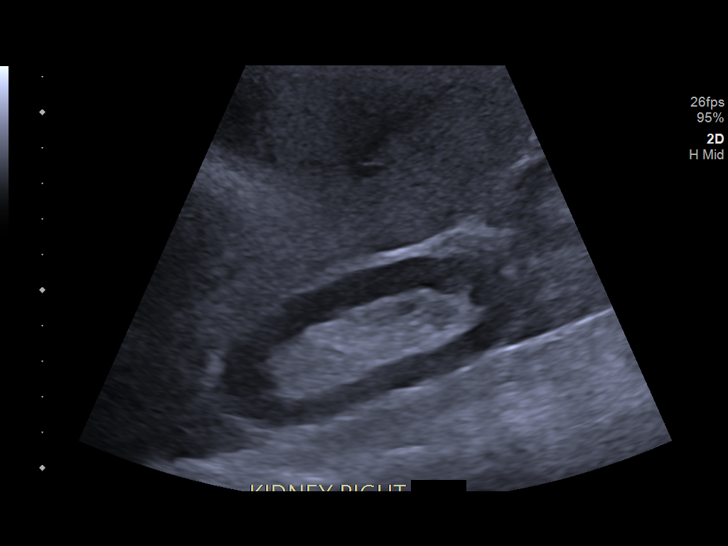
[im 15/33]
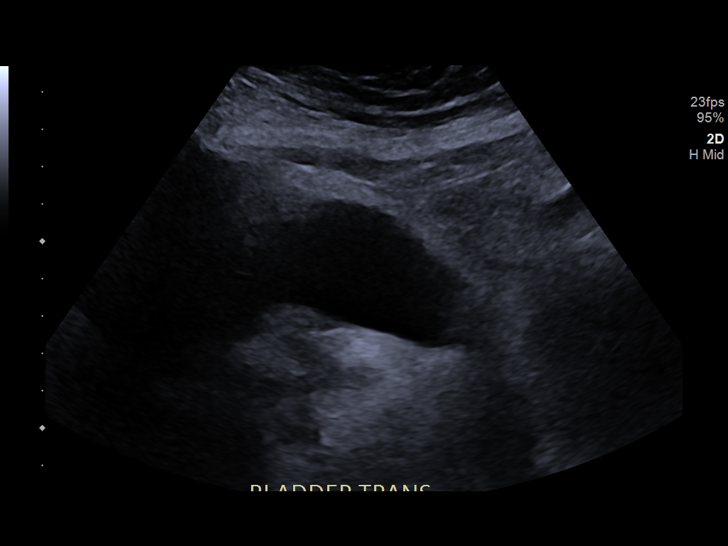
[im 18/33]
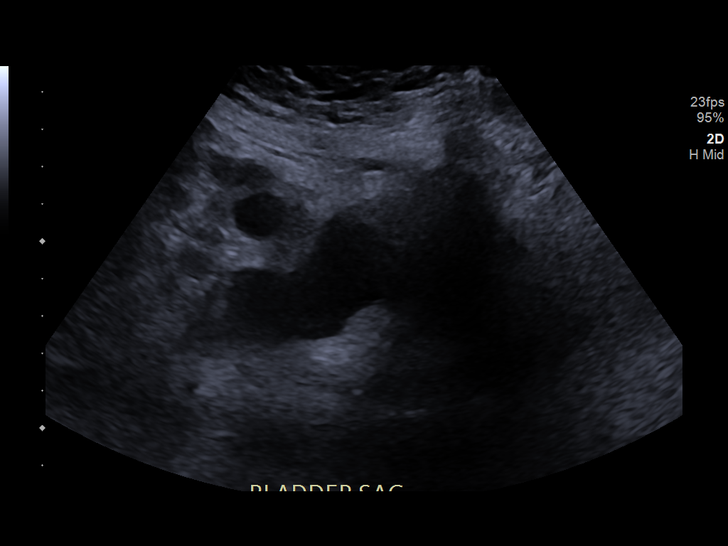
[im 21/33]
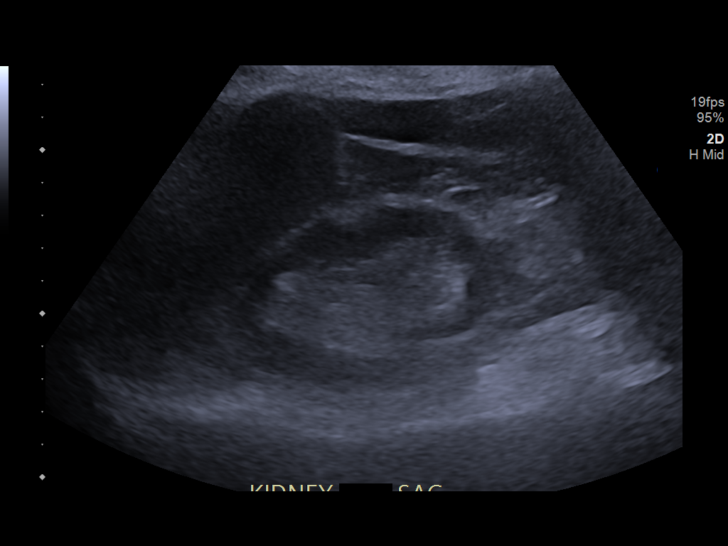
[im 22/33]
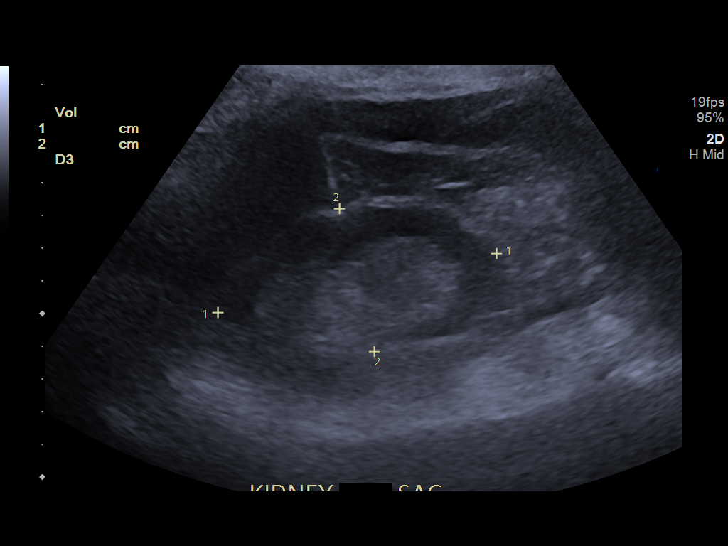
[im 25/33]
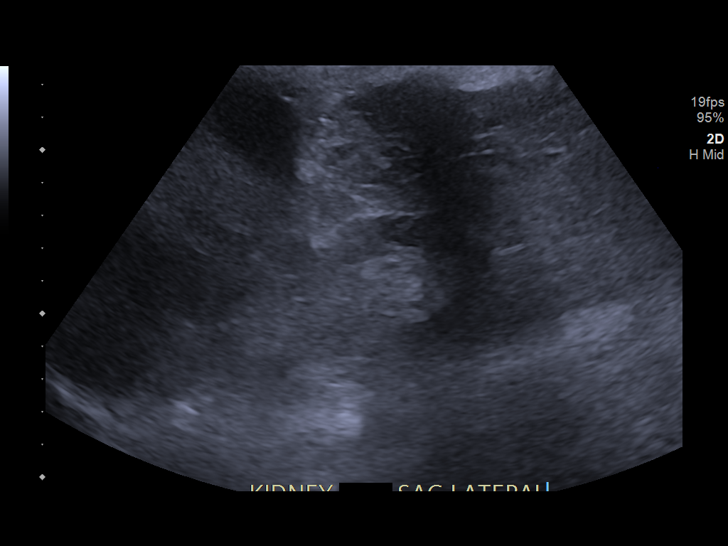
[im 27/33]
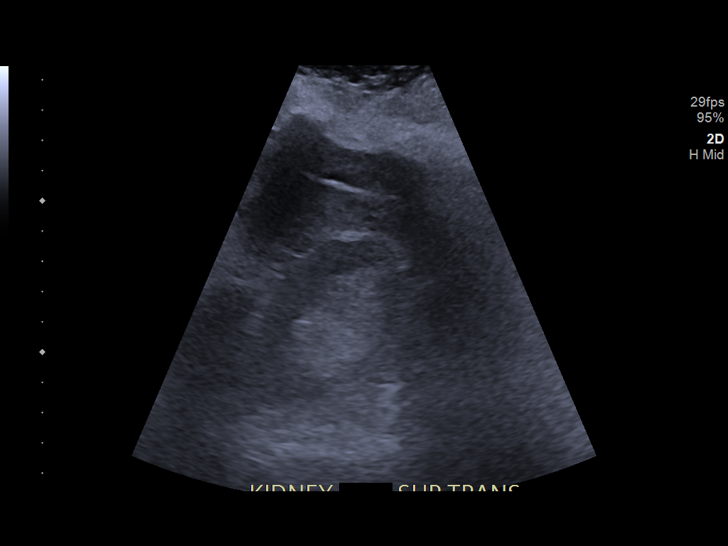
[im 30/33]
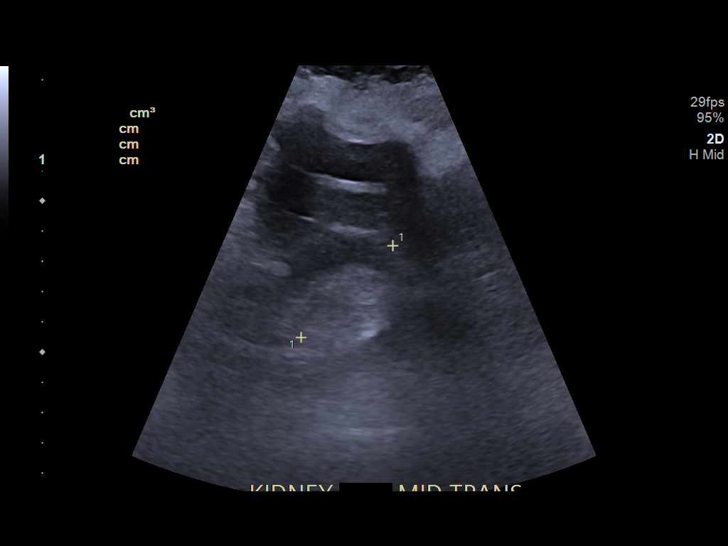
[im 33/33]
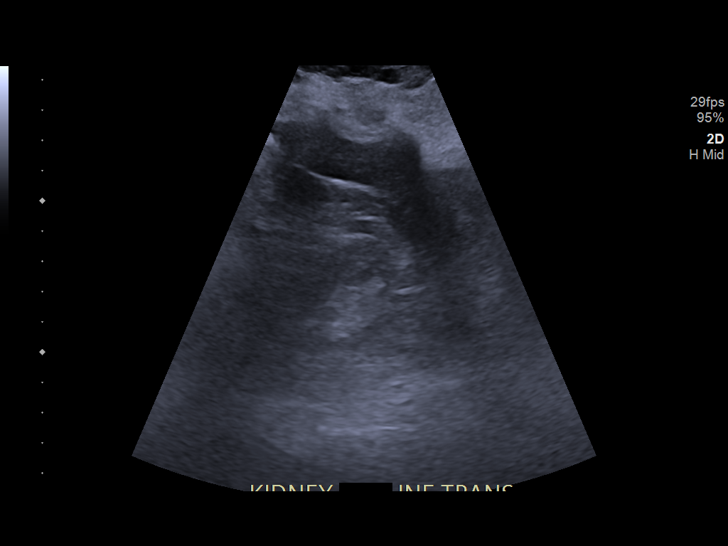

[14 of 25 positions shown; findings below may reference images not displayed]

FINDINGS: Right Kidney:

Renal measurements: 9.2 x 3.8 x 4.8 cm = volume: 89.1 mL. Normal
renal cortical thickness and echogenicity. No focal lesions or
hydronephrosis.

Left Kidney:

Renal measurements: 8.7 x 4.5 x 4.3 cm = volume: 87.9 mL. Normal
renal cortical thickness and echogenicity without focal lesions or
hydronephrosis.

Bladder:

Appears normal for degree of bladder distention.

Other:

None.
IMPRESSION: Normal renal ultrasound examination.

## 2022-01-02 MED ORDER — KCL IN DEXTROSE-NACL 40-5-0.9 MEQ/L-%-% IV SOLN: INTRAVENOUS | Status: DC

## 2022-01-02 MED ORDER — POTASSIUM CHLORIDE CRYS ER 20 MEQ PO TBCR
40.0000 meq | EXTENDED_RELEASE_TABLET | ORAL | Status: DC
  Filled 2022-01-02: qty 2

## 2022-01-02 MED ORDER — POTASSIUM CHLORIDE CRYS ER 20 MEQ PO TBCR
40.0000 meq | EXTENDED_RELEASE_TABLET | ORAL | Status: AC
  Administered 2022-01-02 (×3): 40 meq via ORAL
  Filled 2022-01-02 (×2): qty 2

## 2022-01-02 MED ORDER — MAGNESIUM SULFATE IN D5W 1-5 GM/100ML-% IV SOLN
1.0000 g | Freq: Once | INTRAVENOUS | Status: AC
  Administered 2022-01-02: 1 g via INTRAVENOUS
  Filled 2022-01-02: qty 100

## 2022-01-02 MED ORDER — POTASSIUM CHLORIDE 2 MEQ/ML IV SOLN: INTRAVENOUS | Status: DC

## 2022-01-02 MED ORDER — CHLORHEXIDINE GLUCONATE CLOTH 2 % EX PADS
6.0000 | MEDICATED_PAD | Freq: Every day | CUTANEOUS | Status: DC
  Administered 2022-01-02 – 2022-01-07 (×6): 6 via TOPICAL

## 2022-01-02 NOTE — Progress Notes (Signed)
Took over care of patient at 4pm. Patient alert and oriented, no complaints of pain. Vitals stable. Patient taking PO KCL one at a time. Iv fluids infusing. ?

## 2022-01-02 NOTE — Progress Notes (Signed)
?Progress Note ? ? ?Patient: Traci Fuller BTD:[id number] DOB: [birth year] DOA: 01/01/2022     1 ?DOS: the patient was seen and examined on 01/02/2022 ?  ?Brief hospital course: ?Traci Fuller is a 66 y.o. female with medical history significant of hypertension, hyperlipidemia, prior history of coronary artery disease with myocardial infarction, chronic kidney disease stage IIIb, breast cancer and tobacco abuse; who presented to the hospital from cancer center secondary to intractable nausea, vomiting, diarrhea and inability to keep things down.  Patient reports no hematemesis, hematochezia or melena.  Symptoms appears to be related to a side effect from ongoing chemotherapy.  Patient is afebrile. ?  ?Initial work-up demonstrated mild leukocytosis with concern for hemoconcentration, acute on chronic renal failure with a creatinine up to 3.08, severe hypokalemia and per patient active ongoing GI losses symptoms.  She has failed to respond to oral antiemetics (Compazine and Zofran) and antidiarrheal treatment (Lomotil/Imodium) as an outpatient.  TRH has been contacted to place in the hospital for further evaluation and management. ? ?Assessment and Plan: ?* Acute on chronic renal failure (HCC) ?-Patient with a stage IIIb at baseline; presenting with worsening renal function and acute kidney injury in the setting of prerenal azotemia and dehydration. ?-Urinalysis without suspicious for UTI ?-Renal ultrasound demonstrating no obstructive uropathy and normal degree of bladder distention. ?-So far responding adequately to fluid resuscitation. ?-Continue to avoid nephrotoxic agents, hypotension and the use of contrast. ?-Follow renal function trend. ? ? ? ?Anemia due to chronic kidney disease ?-No overt bleeding appreciated ?-Use of chemotherapy most likely contributing to patient anemia ?-Follow hemoglobin trend ?-transfuse for hemoglobin less than 7. ? ?Thrombocytopenia ([place]) ?-Avoid the use of heparin  products ?-SCDs for DVT prophylaxis. ?-No overt bleeding appreciated; follow platelet count trend. ? ?Leukocytosis ?-Appears to be secondary to stress demargination and hemoconcentration from dehydration ?-WBCs within normal limits after fluid resuscitation initiated. ?-Continue to follow trend. ?-No identified source of infection currently. ? ?Metabolic acidosis ?-In the setting of dehydration and acute on chronic renal failure ?-Appears to be mild; will follow electrolytes trend. ?-Bicarb 19. ?-If needed will provide treatment with sodium bicarbonate. ? ?Hypokalemia ?-In the setting of GI losses and decreased oral intake ?-Will provide repletion and follow electrolytes trend ?-Telemetry monitoring has been ordered. ?-K 2.6 currently. ? ?Nausea vomiting and diarrhea ?-Appears to be secondary to side effects from chemotherapy ?-Patient is afebrile and reports no abdominal pain, dysuria, hematuria or any other complaints to suggest active infection. ?-After fluid resuscitation initiated WBCs back to normal range. ?-Continue supportive care and aggressive fluid resuscitation. ?-Continue as needed antiemetics and the use of Lomotil. ? ?Dehydration ?-In the setting of decreased ability to maintain adequate oral hydration due to uncontrolled nausea/vomiting and diarrhea. ?-Continue fluid resuscitation, as needed antiemetics, the use of Lomotil as needed and follow clinical response. ? ?Breast cancer, left ([place]) ?- Patient has completed 3 cycles of chemotherapy so far. ?-Continue outpatient follow-up with oncology service. ?-Stage IIa-III, locally advanced oligometastatic left breast cancer (cT3, CN I, cM1, G2, ER positive, PR positive, HER 2+). ? ?Essential hypertension ?-Stable overall ?-Continue to follow vital signs; holding antihypertensive agents in the setting of soft blood pressure. ? ?CAD S/P percutaneous coronary angioplasty ?-No chest pain or shortness of breath ?-Continue with the use of aspirin. ?-Due to  thrombocytopenia Brilinta has been placed on hold and given soft BP will also hold on b-blocker. ? ?Hyperlipidemia LDL goal <70 ?-Continue the use of Repatha as an outpatient. ? ?Smoker ?-Cessation  counseling provided ?-Nicotine patch will be ordered. ? ? ? ?Subjective:  ?Afebrile, no chest pain, no shortness of breath, no abdominal pain.  Patient reports no focal weaknesses.  Complaining of intermittent nausea, 1 episode of vomiting and is still ongoing diarrhea. ? ?Physical Exam: ?Vitals:  ? 01/01/22 1633 01/01/22 1657 01/01/22 2210 01/02/22 0310  ?BP:  123/66 110/68 (!) 122/58  ?Pulse:  79 69 73  ?Resp:  _0 ?Temp:  98.3 ?F (36.8 ?C) 97.6 ?F (36.4 ?C) 97.8 ?F (36.6 ?C)  ?TempSrc:  Oral    ?SpO2:   100% 100%  ?Weight: 67.6 kg 67.6 kg    ?Height:  _1  (1.549 m)    ? ?General exam: Alert, awake, oriented x 3; feeling slightly better.  No chest pain, no shortness of breath.  Still complaining of intermittent nausea; 1 episode of vomiting and still ongoing diarrhea. ?Respiratory system: Clear to auscultation. Respiratory effort normal.  Good saturation on room air. ?Cardiovascular system:RRR. No murmurs, rubs, gallops.  No JVD. ?Gastrointestinal system: Abdomen is nondistended, soft and nontender. No organomegaly or masses felt. Normal bowel sounds heard. ?Central nervous system: Alert and oriented. No focal neurological deficits. ?Extremities: No cyanosis, clubbing or edema. ?Skin: No petechiae.  Right Port-A-Cath in place. ?Psychiatry: Judgement and insight appear normal. Mood & affect appropriate.  ? ?Data Reviewed: ?Comprehensive metabolic panel demonstrating sodium 138, potassium 2.6, chloride 113, bicarb 19, BUN 39, creatinine 2.21, alk phosphatase 170, AST 12 and ALT 15. ?CBC with a WBCs of 9.4, hemoglobin 7.2 and platelet counts 70,000. ? ?Family Communication: Husband at bedside. ? ?Disposition: ?Status is: Inpatient ?Remains inpatient appropriate because: Continue fluid resuscitation, electrolyte  repletion and follow renal function trend.  Still having inability to tolerate oral intake.  Patient reports ongoing diarrhea. ? ? Planned Discharge Destination: Home ? ? ?Author: ?Barton Dubois, MD ?01/02/2022 7:10 PM ? ?For on call review [email or web address].  ?

## 2022-01-02 NOTE — Progress Notes (Signed)
Date and time results received: 01/02/22 0603 ?(use smartphrase ".now" to insert current time) ? ?Test: Potassium ?Critical Value: 2.6 ? ?Name of Provider Notified: Dr. Cathlean Sauer ? ?Orders Received? Or Actions Taken?:  No new orders ?

## 2022-01-03 DIAGNOSIS — C50812 Malignant neoplasm of overlapping sites of left female breast: Secondary | ICD-10-CM | POA: Diagnosis not present

## 2022-01-03 DIAGNOSIS — N179 Acute kidney failure, unspecified: Secondary | ICD-10-CM | POA: Diagnosis not present

## 2022-01-03 DIAGNOSIS — I2510 Atherosclerotic heart disease of native coronary artery without angina pectoris: Secondary | ICD-10-CM | POA: Diagnosis not present

## 2022-01-03 DIAGNOSIS — E860 Dehydration: Secondary | ICD-10-CM | POA: Diagnosis not present

## 2022-01-03 LAB — ABO/RH: ABO/RH(D): A POS

## 2022-01-03 LAB — CBC
HCT: 22.1 % — ABNORMAL LOW (ref 36.0–46.0)
Hemoglobin: 6.8 g/dL — CL (ref 12.0–15.0)
MCH: 32.5 pg (ref 26.0–34.0)
MCHC: 30.8 g/dL (ref 30.0–36.0)
MCV: 105.7 fL — ABNORMAL HIGH (ref 80.0–100.0)
Platelets: 92 10*3/uL — ABNORMAL LOW (ref 150–400)
RBC: 2.09 MIL/uL — ABNORMAL LOW (ref 3.87–5.11)
RDW: 23.1 % — ABNORMAL HIGH (ref 11.5–15.5)
WBC: 8.9 10*3/uL (ref 4.0–10.5)
nRBC: 0 % (ref 0.0–0.2)

## 2022-01-03 LAB — BASIC METABOLIC PANEL
Anion gap: 5 (ref 5–15)
BUN: 28 mg/dL — ABNORMAL HIGH (ref 8–23)
CO2: 16 mmol/L — ABNORMAL LOW (ref 22–32)
Calcium: 9.1 mg/dL (ref 8.9–10.3)
Chloride: 120 mmol/L — ABNORMAL HIGH (ref 98–111)
Creatinine, Ser: 1.97 mg/dL — ABNORMAL HIGH (ref 0.44–1.00)
GFR, Estimated: 28 mL/min — ABNORMAL LOW (ref >60)
Glucose, Bld: 113 mg/dL — ABNORMAL HIGH (ref 70–99)
Potassium: 3.4 mmol/L — ABNORMAL LOW (ref 3.5–5.1)
Sodium: 141 mmol/L (ref 135–145)

## 2022-01-03 LAB — PREPARE RBC (CROSSMATCH)

## 2022-01-03 MED ORDER — POTASSIUM CHLORIDE CRYS ER 20 MEQ PO TBCR
40.0000 meq | EXTENDED_RELEASE_TABLET | Freq: Once | ORAL | Status: AC
  Administered 2022-01-03: 40 meq via ORAL
  Filled 2022-01-03: qty 2

## 2022-01-03 MED ORDER — BOOST / RESOURCE BREEZE PO LIQD CUSTOM
1.0000 | Freq: Three times a day (TID) | ORAL | Status: DC
  Administered 2022-01-03 – 2022-01-07 (×8): 1 via ORAL

## 2022-01-03 MED ORDER — ADULT MULTIVITAMIN W/MINERALS CH
1.0000 | ORAL_TABLET | Freq: Every day | ORAL | Status: DC
  Administered 2022-01-03 – 2022-01-07 (×5): 1 via ORAL
  Filled 2022-01-03 (×5): qty 1

## 2022-01-03 MED ORDER — LOPERAMIDE HCL 2 MG PO CAPS
4.0000 mg | ORAL_CAPSULE | Freq: Three times a day (TID) | ORAL | Status: DC | PRN
  Administered 2022-01-04 – 2022-01-05 (×3): 4 mg via ORAL
  Filled 2022-01-03 (×4): qty 2

## 2022-01-03 MED ORDER — SODIUM CHLORIDE 0.9% IV SOLUTION: Freq: Once | INTRAVENOUS | Status: AC

## 2022-01-03 NOTE — Discharge Instructions (Signed)
Maximizing Nutrition During Cancer Treatment ? ?Eating enough calories and protein and drinking enough fluid can be challenging during cancer treatment. Your body uses calories and protein for fuel to support healthy organs, muscle repair, and daily activity. Often your body will need more calories and protein to heal tissues and fight infections during cancer treatment. ?Low appetite, fatigue, and treatment-related side effects can make it difficult to prepare foods and find foods that sound appealing. There are simple ways to encourage adequate intake that will help to provide the nutrition you need during treatment. ? ?Tips ?Issue Nutritional Considerations  ?Low appetite Eat 6 to 8 small meals and snacks throughout the day ?Schedule your mealtimes instead of waiting to be hungry ?Do not skip meals ?Carry snacks with you so that food is always available ?Eat a bigger meal at the time of day when your appetite is largest ?Choose milkshakes, smoothies or high-calorie, high-protein nutritional drinks if you don't feel like eating a meal  ?Feeling full quickly Eat 6 to 8 small meals and snacks throughout the day instead of 3 large meals ?Avoid drinking large amounts of fluids with meals, rather sip on fluids throughout the day ?Make every bite count by choosing high-calorie and high-protein foods when possible  ?Difficulty eating meats Try ground or chopped meats mixed into a sauce or in a soup or casserole ?Use other protein sources including eggs, milk, cheese, yogurt, beans, cottage cheese, nuts and nut butter, and tofu ?Include a protein source at each meal and snack ?Drink high-calorie, high-protein nutritional drinks to help boost your protein intake  ?Taste changes Try enhancing the flavor of foods with herbs, spices, seasoning blends, marinades, and sauces ?Season foods with tart flavors such as citrus fruits, vinegar-based dressings, and pickled foods. You may need to avoid these foods if you are  experiencing sore mouth or throat. ?Add sugar to improve the flavor of salty foods ?Add salt to decrease the sweetness of sugary foods ?Use lemon juice on foods to mask metallic taste ?Use plastic utensils if you experience a metallic taste ?Practice good oral hygiene ?Rinse mouth with baking soda and saltwater rinse (1 quart water mixed with ? teaspoon of salt and 1 teaspoon of baking soda) before and after meals to cleanse the tastebuds  ?Nausea and vomiting Attempt to eat small, frequent meals ?Choose bland, starchy foods and clear liquids, all served at room temperature ?Avoid greasy, high-fat foods and highly seasoned foods ?Drink liquids between meals rather than with meals ?Limit exposure to food odors by avoiding food preparation areas and using exhaust fans or opening windows ?Time meals to when nausea medications are most effective  ?Weight loss Increase the calories in your foods: ?Use milk instead of water for soups and cooked cereals ?Add butter or oil to potatoes, rice, pasta, vegetables, sauces, soups, or casseroles ?Spread butter or oil on bread for sandwiches ?Use mayonnaise or salad dressing on sandwiches and in dips or sauces ?Add gravy or buttery sauces to meats and vegetables ?Snack on avocado, guacamole, nuts, nut butter, olives or hummus ?Add extra protein to soups by slicing cooked egg white into the soup  ?Dehydration Drink 8-10 cups of fluid per day ?Use cues, like a timer, as a reminder to have something to drink ?Drink small amounts of fluid as often as possible ?Keep a filled water bottle nearby at all times ?Try flavored waters, milks, sports drinks, fruits juice, caffeine-free soft drinks, and other beverages to increase the variety ?Eat foods that are high  in fluids like fresh fruit, broth, soups, gelatin, fruit ice, popsicles, ice cream, sorbet, sherbet, milkshakes, and high-calorie/high-protein nutritional drinks  ?Fatigue makes it difficult to prepare meals Choose foods that are  ready to eat or easy to make to minimize time for food preparation including pudding cups, peanut butter, tuna, cereal bars, trail mix, cheese and crackers and eggs ?Use ready-to-drink high calorie/high protein nutritional drinks ?Make extra food and freeze the leftovers when you are able ?Keep meal preparation simple by using prepared and frozen foods ?Accept offers from family and friends to prepare meals or grocery shop for you ?Let your friends and family who help with meal preparation know your food preferences and needs  ? ?Easy to Prepare High-Calorie, High-Protein Sample 1-Day Menu View Nutrient Info ?Breakfast 1 frozen waffle, toasted ?2 tablespoons peanut butter ?1 banana, sliced ?1 cup whole milk  ?Morning Snack ? cup yogurt ?? cup strawberries ?? cup granola  ?Lunch Tuna salad sandwich made with: 2 slices bread, whole wheat ?3 ounces tuna ?1 tablespoon mayonnaise, mixed with tuna ?1 slice of cheese ?One 3.5-ounce, ready-to-eat pudding cup  ?Afternoon Snack 1 stick string cheese ?5 whole wheat crackers  ?Evening Meal 1 individually portioned frozen chicken pot pie, baked ?? cup of peas, cooked ?1 teaspoon margarine, for peas  ?Evening Snack 1 cup high-calorie, high-protein nutrition drink ?1 chocolate chip cookie  ?Daily Sum ?Nutrient Unit Value  ?Macronutrients  ?Energy kcal 2608  ?Energy kJ 10918  ?Protein g 101  ?Total lipid (fat) g 123  ?Carbohydrate, by difference g 286  ?Fiber, total dietary g 23  ?Sugars, total g 111  ?Minerals  ?Calcium, Ca mg 1686  ?Iron, Fe mg 17  ?Sodium, Na mg 3239  ?Vitamins  ?Vitamin C, total ascorbic acid mg 100  ?Vitamin A, IU IU 7007  ?Vitamin D IU 293  ?Lipids  ?Fatty acids, total saturated g 42  ?Fatty acids, total monounsaturated g 41  ?Fatty acids, total polyunsaturated g 31  ?Cholesterol mg 187  ?  ?Copyright 2023 ? Academy of [employer].   ? ?

## 2022-01-03 NOTE — Progress Notes (Addendum)
Date and time results received: 01/03/22 0618 ? ? ?Test: HBG  ?Critical Value: 6.8 ? ?Name of Provider Notified: Argie Ramming ? ?Orders Received? Or Actions Taken?: None at this time. Waiting for orders if any.  ?

## 2022-01-03 NOTE — Progress Notes (Signed)
Initial Nutrition Assessment ? ?DOCUMENTATION CODES:  ? ?Not applicable ? ?INTERVENTION:  ? ?Multivitamin w/ minerals daily ?Boost Breeze po TID, each supplement provides 250 kcal and 9 grams of protein ?Advance diet as medically appropriate per MD ? ?NUTRITION DIAGNOSIS:  ? ?Inadequate oral intake related to nausea, vomiting as evidenced by per patient/family report. ? ?GOAL:  ? ?Patient will meet greater than or equal to 90% of their needs ? ?MONITOR:  ? ?PO intake, Supplement acceptance, Diet advancement, Weight trends, Labs ? ?REASON FOR ASSESSMENT:  ? ?Malnutrition Screening Tool ?  ? ?ASSESSMENT:  ? ?66 y.o. female presented for intractable nausea, vomiting, and diarrhea. PMH includes breast cancer (currently undergoing chemotherapy), HTN, and CKD IIIb. Pt admitted with AKI on CKD and anemia 2/2 CKD.  ? ?Pt reports that she has had an altered taste since starting chemo, reports that it is a metallic taste. States that the altered taste really impacted her appetite and PO intake. Reports that she then started having diarrhea, nausea, and vomiting.Pt states that it has improved some in the hospital and that yesterday she was able to tolerate broth and apple juice. Discussed that this morning she has not been able to tolerate as well.  ? ?Pt reports a UBW of 173# and has a 30# weight loss since starting chemo. Per EMR, pt has had a 13.6% weight loss within 2 months, this is clinically significant for time frame.  ? ?Dicussed ONS with pt; pt agreeable to try Boost Breeze.  ? ?RD to add "Maximizing Nutrition During Cancer Treatment" to pt discharge instructions. Discussed with pt that she can follow-up with an RD at the cancer center if she would like to.  ? ?Medications reviewed and include: Protonix ?Labs reviewed: Potassium 2.6, BUN 39, Creatinine 2.21 ? ?NUTRITION - FOCUSED PHYSICAL EXAM: ? ?Deferred to follow-up due to RD working remotely.  ? ?Diet Order:   ?Diet Order   ? ?       ?  Diet clear liquid Room  service appropriate? Yes; Fluid consistency: Thin  Diet effective now       ?  ? ?  ?  ? ?  ? ? ?EDUCATION NEEDS:  ? ?Education needs have been addressed ? ?Skin:  Skin Assessment: Reviewed RN Assessment ? ?Last BM:  5/12 ? ?Height:  ?Ht Readings from Last 1 Encounters:  ?01/01/22 '5\' 1"'$  (1.549 m)  ? ?Weight:  ?Wt Readings from Last 1 Encounters:  ?01/01/22 67.6 kg  ? ?Ideal Body Weight:  47.7 kg ? ?BMI:  Body mass index is 28.16 kg/m?. ? ?Estimated Nutritional Needs:  ?Kcal:  2000-2200 ?Protein:  100-115 grams ?Fluid:  >/= 2 L ? ? ?Hermina Barters RD, LDN ?Clinical Dietitian ?See [facility] for contact information.  ?

## 2022-01-03 NOTE — Progress Notes (Signed)
?Progress Note ? ? ?Patient: Traci Fuller DOB: [birth year] DOA: 01/01/2022     2 ?DOS: the patient was seen and examined on 01/03/2022 ?  ?Brief hospital course: ?Traci Fuller is a 66 y.o. female with medical history significant of hypertension, hyperlipidemia, prior history of coronary artery disease with myocardial infarction, chronic kidney disease stage IIIb, breast cancer and tobacco abuse; who presented to the hospital from cancer center secondary to intractable nausea, vomiting, diarrhea and inability to keep things down.  Patient reports no hematemesis, hematochezia or melena.  Symptoms appears to be related to a side effect from ongoing chemotherapy.  Patient is afebrile. ?  ?Initial work-up demonstrated mild leukocytosis with concern for hemoconcentration, acute on chronic renal failure with a creatinine up to 3.08, severe hypokalemia and per patient active ongoing GI losses symptoms.  She has failed to respond to oral antiemetics (Compazine and Zofran) and antidiarrheal treatment (Lomotil/Imodium) as an outpatient.  TR[facility] has been contacted to place in the hospital for further evaluation and management. ? ?Assessment and Plan: ?* Acute on chronic renal failure (HCC) ?-Patient with a stage IIIb at baseline; presenting with worsening renal function and acute kidney injury in the setting of prerenal azotemia and dehydration. ?-Urinalysis without suspicious for UTI; but patient reporting some discomfort while urinating.  Urine will be checked for cultures. ?-Renal ultrasound demonstrating no obstructive uropathy and normal degree of bladder distention. ?-So far responding adequately to fluid resuscitation. ?-Creatinine down to 1.97 ?-Continue to avoid nephrotoxic agents, hypotension and the use of contrast. ?-Continue to follow renal function trend. ? ? ? ?Anemia due to chronic kidney disease ?-No overt bleeding appreciated ?-Use of chemotherapy most likely contributing to patient  anemia ?-Hemoglobin 6.8; will transfuse 1 unit of PRBCs ?-Follow response and hemoglobin trend. ? ?Thrombocytopenia ([place]) ?-Avoid the use of heparin products ?-SCDs for DVT prophylaxis. ?-No overt bleeding appreciated; follow platelet count trend. ? ?Leukocytosis ?-Appears to be secondary to stress demargination and hemoconcentration from dehydration ?-WBCs within normal limits after fluid resuscitation initiated. ?-Continue to follow trend. ?-No identified source of infection currently. ? ?Metabolic acidosis ?-In the setting of dehydration from diarrhea and acute on chronic renal failure ?-Appears to be mild; will follow electrolytes trend. ?-Bicarb 16. ?-If needed will provide treatment with sodium bicarbonate. ? ?Hypokalemia ?-In the setting of GI losses and decreased oral intake ?-Will provide repletion and follow electrolytes trend ?-Telemetry monitoring has been ordered. ?-K 3.4 currently. ? ?Nausea vomiting and diarrhea ?-Appears to be secondary to side effects from chemotherapy ?-Patient is afebrile and reports no abdominal pain, dysuria, hematuria or any other complaints to suggest active infection. ?-After fluid resuscitation WBCs within normal range.  Patient denies abdominal pain. ?-Continue supportive care and aggressive fluid resuscitation. ?-Continue as needed antiemetics. ?-Diet will be advanced to full liquid. ? ?Dehydration ?-In the setting of decreased ability to maintain adequate oral hydration due to uncontrolled nausea/vomiting and diarrhea. ?-Continue fluid resuscitation, as needed antiemetics, the use of Lomotil and as needed Imodium. ?-Follow clinical response.   ? ?Breast cancer, left ([place]) ?- Patient has completed 3 cycles of chemotherapy so far. ?-Continue outpatient follow-up with oncology service. ?-Stage IIa-III, locally advanced oligometastatic left breast cancer (cT3, CN I, cM1, G2, ER positive, PR positive, HER 2+). ? ?Essential hypertension ?-Stable overall ?-Continue to follow  vital signs; holding antihypertensive agents in the setting of soft blood pressure. ? ?CAD S/P percutaneous coronary angioplasty ?-No chest pain or shortness of breath ?-Continue with the use of aspirin. ?-  Due to thrombocytopenia Brilinta has been placed on hold and given soft BP will also continue to hold b-blocker. ? ?Hyperlipidemia LDL goal <70 ?-Continue the use of Repatha as an outpatient. ? ?Smoker ?-Cessation counseling provided ?-Nicotine patch ordered. ? ? ? ?Subjective:  ?No fever, no chest pain, no abdominal pain.  Patient reports still having diarrhea and nausea.  No further vomiting. ? ?Physical Exam: ?Vitals:  ? 01/03/22 1242 01/03/22 1312 01/03/22 1313 01/03/22 1557  ?BP: 118/68 130/67 130/67 125/68  ?Pulse: 78 86 86 77  ?Resp: 20 (!) 80 20 18  ?Temp: 98.3 ?F (36.8 ?C) 98 ?F (36.7 ?C) 98 ?F (36.7 ?C) 98.7 ?F (37.1 ?C)  ?TempSrc: Oral  Oral Oral  ?SpO2: 100%  100% 100%  ?Weight:      ?Height:      ? ?General exam: Alert, awake, oriented x 3; reports feeling better and denying any episodes of vomiting.  Still with intermittent nausea and having diarrhea (less episodes). ?Respiratory system: Clear to auscultation. Respiratory effort normal.  No requiring oxygen supplementation. ?Cardiovascular system:RRR. No murmurs, rubs, gallops.  No JVD. ?Gastrointestinal system: Abdomen is nondistended, soft and nontender. No organomegaly or masses felt. Normal bowel sounds heard. ?Central nervous system: Alert and oriented. No focal neurological deficits. ?Extremities: No cyanosis or clubbing. ?Skin: No petechiae. ?Psychiatry: Judgement and insight appear normal. Mood & affect appropriate.  ? ?Data Reviewed: ?Comprehensive metabolic panel demonstrating sodium 141, potassium 3.4, chloride 120, bicarb 16, BUN 28 and creatinine 1.97.  Anion gap 5 ?CBC with WBCs 8.9, hemoglobin 6.8 and platelet count 92 K ? ?Family Communication: Husband at bedside. ? ?Disposition: ?Status is: Inpatient ?Remains inpatient appropriate  because: Continue fluid resuscitation, electrolyte repletion and follow renal function trend.  Still having inability to tolerate oral intake.  Patient reports ongoing diarrhea. ? ? Planned Discharge Destination: Home ? ? ?Author: ?Barton Dubois, MD ?01/03/2022 6:16 PM ? ?For on call review [email or web address].  ?

## 2022-01-04 DIAGNOSIS — E860 Dehydration: Secondary | ICD-10-CM | POA: Diagnosis not present

## 2022-01-04 DIAGNOSIS — C50812 Malignant neoplasm of overlapping sites of left female breast: Secondary | ICD-10-CM | POA: Diagnosis not present

## 2022-01-04 DIAGNOSIS — I2510 Atherosclerotic heart disease of native coronary artery without angina pectoris: Secondary | ICD-10-CM | POA: Diagnosis not present

## 2022-01-04 DIAGNOSIS — N179 Acute kidney failure, unspecified: Secondary | ICD-10-CM | POA: Diagnosis not present

## 2022-01-04 LAB — CBC
HCT: 28.9 % — ABNORMAL LOW (ref 36.0–46.0)
Hemoglobin: 8.8 g/dL — ABNORMAL LOW (ref 12.0–15.0)
MCH: 31.4 pg (ref 26.0–34.0)
MCHC: 30.4 g/dL (ref 30.0–36.0)
MCV: 103.2 fL — ABNORMAL HIGH (ref 80.0–100.0)
Platelets: 131 10*3/uL — ABNORMAL LOW (ref 150–400)
RBC: 2.8 MIL/uL — ABNORMAL LOW (ref 3.87–5.11)
RDW: 23.1 % — ABNORMAL HIGH (ref 11.5–15.5)
WBC: 10.9 10*3/uL — ABNORMAL HIGH (ref 4.0–10.5)
nRBC: 0 % (ref 0.0–0.2)

## 2022-01-04 LAB — BASIC METABOLIC PANEL
Anion gap: 3 — ABNORMAL LOW (ref 5–15)
BUN: 18 mg/dL (ref 8–23)
CO2: 15 mmol/L — ABNORMAL LOW (ref 22–32)
Calcium: 9 mg/dL (ref 8.9–10.3)
Chloride: 123 mmol/L — ABNORMAL HIGH (ref 98–111)
Creatinine, Ser: 1.63 mg/dL — ABNORMAL HIGH (ref 0.44–1.00)
GFR, Estimated: 35 mL/min — ABNORMAL LOW (ref >60)
Glucose, Bld: 106 mg/dL — ABNORMAL HIGH (ref 70–99)
Potassium: 3.8 mmol/L (ref 3.5–5.1)
Sodium: 141 mmol/L (ref 135–145)

## 2022-01-04 LAB — TYPE AND SCREEN
ABO/RH(D): A POS
Antibody Screen: NEGATIVE
Unit division: 0

## 2022-01-04 LAB — BPAM RBC
Blood Product Expiration Date: 202306102359
ISSUE DATE / TIME: 202305141252
Unit Type and Rh: 6200

## 2022-01-04 MED ORDER — SODIUM BICARBONATE 650 MG PO TABS
650.0000 mg | ORAL_TABLET | Freq: Two times a day (BID) | ORAL | Status: DC
  Administered 2022-01-04 – 2022-01-05 (×3): 650 mg via ORAL
  Filled 2022-01-04 (×3): qty 1

## 2022-01-04 NOTE — TOC Progression Note (Signed)
?  Transition of Care (TOC) Screening Note ? ? ?Patient Details  ?Name: Traci Fuller ?Date of Birth: 06-27-1956 ? ? ?Transition of Care (TOC) CM/SW Contact:    ?Shade Flood, LCSW ?Phone Number: ?01/04/2022, 9:31 AM ? ? ? ?Transition of Care Department [facility]) has reviewed patient and no TOC needs have been identified at this time. We will continue to monitor patient advancement through interdisciplinary progression rounds. If new patient transition needs arise, please place a TOC consult. ? ? ?

## 2022-01-04 NOTE — Progress Notes (Signed)
?Progress Note ? ? ?Patient: Traci Fuller:[id number] DOB: [birth year] DOA: 01/01/2022     3 ?DOS: the patient was seen and examined on 01/04/2022 ?  ?Brief hospital course: ?JULEY GIOVANETTI is a 66 y.o. female with medical history significant of hypertension, hyperlipidemia, prior history of coronary artery disease with myocardial infarction, chronic kidney disease stage IIIb, breast cancer and tobacco abuse; who presented to the hospital from cancer center secondary to intractable nausea, vomiting, diarrhea and inability to keep things down.  Patient reports no hematemesis, hematochezia or melena.  Symptoms appears to be related to a side effect from ongoing chemotherapy.  Patient is afebrile. ?  ?Initial work-up demonstrated mild leukocytosis with concern for hemoconcentration, acute on chronic renal failure with a creatinine up to 3.08, severe hypokalemia and per patient active ongoing GI losses symptoms.  She has failed to respond to oral antiemetics (Compazine and Zofran) and antidiarrheal treatment (Lomotil/Imodium) as an outpatient.  TR[facility] has been contacted to place in the hospital for further evaluation and management. ? ?Assessment and Plan: ?* Acute on chronic renal failure (HCC) ?-Patient with a stage IIIb at baseline; presenting with worsening renal function and acute kidney injury in the setting of prerenal azotemia and dehydration. ?-Urinalysis without suspicious for UTI; but patient reporting some discomfort while urinating.  Urine will be checked for cultures. ?-Renal ultrasound demonstrating no obstructive uropathy and normal degree of bladder distention. ?-So far responding adequately to fluid resuscitation. ?-Creatinine down to 1.97 ?-Continue to avoid nephrotoxic agents, hypotension and the use of contrast. ?-Continue to follow renal function trend. ? ? ? ?Anemia due to chronic kidney disease ?-No overt bleeding appreciated ?-Use of chemotherapy most likely contributing to patient  anemia ?-Hemoglobin 8.8; after receiving 1 unit PRBC transfusion on 01/03/2022. ?-Follow response/stability and hemoglobin trend. ? ?Thrombocytopenia ([place]) ?-Avoid the use of heparin products ?-SCDs for DVT prophylaxis. ?-No overt bleeding appreciated; follow platelet count trend. ?-Platelet count 131 K. ?-No signs of overt bleeding. ? ?Leukocytosis ?-Appears to be secondary to stress demargination and hemoconcentration from dehydration ?-WBCs within normal limits after fluid resuscitation initiated. ?-Continue to follow trend. ?-No identified source of infection currently. ? ?Metabolic acidosis ?-In the setting of dehydration from diarrhea and acute on chronic renal failure ?-Bicarb 15. ?-Will add sodium bicarbonate twice a day; anion gap 3. ?-Appears to be mild to moderate; will follow electrolytes trend. ? ?Hypokalemia ?-In the setting of GI losses and decreased oral intake ?-Will provide repletion and follow electrolytes trend ?-Telemetry monitoring has been ordered. ?-K 3.8 currently. ? ?Nausea vomiting and diarrhea ?-Appears to be secondary to side effects from chemotherapy ?-Patient is afebrile and reports no abdominal pain, dysuria, hematuria or any other complaints to suggest active infection. ?-After fluid resuscitation WBCs within normal range.  Patient denies abdominal pain. ?-Continue supportive care and aggressive fluid resuscitation. ?-Continue as needed antiemetics. ?-Diet will be advanced to full liquid. ? ?Dehydration ?-In the setting of decreased ability to maintain adequate oral hydration due to uncontrolled nausea/vomiting and diarrhea. ?-Continue fluid resuscitation, as needed antiemetics, the use of Lomotil and as needed Imodium. ?-Follow clinical response.   ? ?Breast cancer, left ([place]) ?- Patient has completed 3 cycles of chemotherapy so far. ?-Continue outpatient follow-up with oncology service. ?-Stage IIa-III, locally advanced oligometastatic left breast cancer (cT3, CN I, cM1, G2, ER  positive, PR positive, HER 2+). ? ?Essential hypertension ?-Stable overall ?-Continue to follow vital signs; holding antihypertensive agents in the setting of soft blood pressure. ? ?CAD S/P percutaneous  coronary angioplasty ?-No chest pain or shortness of breath ?-Continue with the use of aspirin. ?-Due to thrombocytopenia Brilinta has been placed on hold and given soft BP will also continue to hold b-blocker. ? ?Hyperlipidemia LDL goal <70 ?-Continue the use of Repatha as an outpatient. ? ?Smoker ?-Cessation counseling provided ?-Nicotine patch ordered. ? ? ?Subjective:  ?Reports feeling better today; no nausea, no vomiting.  Still with significant ongoing diarrhea and feeling weak. ? ?Physical Exam: ?Vitals:  ? 01/03/22 1557 01/03/22 2125 01/04/22 0439 01/04/22 1335  ?BP: 125/68 126/70 111/62 (!) 142/68  ?Pulse: 77 72 73 78  ?Resp: '18 19 19 20  '$ ?Temp: 98.7 ?F (37.1 ?C) 97.7 ?F (36.5 ?C) 97.9 ?F (36.6 ?C) 98.4 ?F (36.9 ?C)  ?TempSrc: Oral Oral Oral Oral  ?SpO2: 100% 99% 100% 100%  ?Weight:      ?Height:      ? ?General exam: Alert, awake, oriented x 3; currently not reporting nausea vomiting; expresses still having ongoing diarrhea (approximately 10 loose bowel movements in 12 hours).  No fever, no chest pain. ?Respiratory system: Clear to auscultation. Respiratory effort normal.  Good saturation on room air. ?Cardiovascular system:RRR. No murmurs, rubs, gallops.  No JVD. ?Gastrointestinal system: Abdomen is nondistended, soft and nontender. No organomegaly or masses felt. Normal bowel sounds heard. ?Central nervous system: Alert and oriented. No focal neurological deficits. ?Extremities: No cyanosis or clubbing ?Skin: No petechiae. ?Psychiatry: Judgement and insight appear normal. Mood & affect appropriate.  ? ? ?Data Reviewed: ?Basic metabolic panel demonstrating sodium 141, potassium 3.8, bicarb 15, BUN 18, creatinine 1.63.  ?Anion gap 3 ?CBC with WBCs 10.9, hemoglobin 8.8, MCV 103.2 and platelet count 131  K ? ?Family Communication: Husband at bedside. ? ?Disposition: ?Status is: Inpatient ?Remains inpatient appropriate because: Continue fluid resuscitation, electrolyte repletion and follow renal function trend.  Still having inability to tolerate oral intake.  Patient reports ongoing diarrhea. ? ? Planned Discharge Destination: Home ? ? ?Author: ?Barton Dubois, MD ?01/04/2022 4:00 PM ? ?For on call review [email or web address]  ?

## 2022-01-05 ENCOUNTER — Inpatient Hospital Stay (HOSPITAL_COMMUNITY): Payer: Managed Care, Other (non HMO)

## 2022-01-05 DIAGNOSIS — I2510 Atherosclerotic heart disease of native coronary artery without angina pectoris: Secondary | ICD-10-CM | POA: Diagnosis not present

## 2022-01-05 DIAGNOSIS — C50812 Malignant neoplasm of overlapping sites of left female breast: Secondary | ICD-10-CM | POA: Diagnosis not present

## 2022-01-05 DIAGNOSIS — E860 Dehydration: Secondary | ICD-10-CM | POA: Diagnosis not present

## 2022-01-05 DIAGNOSIS — R109 Unspecified abdominal pain: Secondary | ICD-10-CM | POA: Diagnosis not present

## 2022-01-05 DIAGNOSIS — N179 Acute kidney failure, unspecified: Secondary | ICD-10-CM | POA: Diagnosis not present

## 2022-01-05 LAB — BASIC METABOLIC PANEL
Anion gap: 3 — ABNORMAL LOW (ref 5–15)
BUN: 12 mg/dL (ref 8–23)
CO2: 17 mmol/L — ABNORMAL LOW (ref 22–32)
Calcium: 8.8 mg/dL — ABNORMAL LOW (ref 8.9–10.3)
Chloride: 122 mmol/L — ABNORMAL HIGH (ref 98–111)
Creatinine, Ser: 1.3 mg/dL — ABNORMAL HIGH (ref 0.44–1.00)
GFR, Estimated: 46 mL/min — ABNORMAL LOW (ref >60)
Glucose, Bld: 94 mg/dL (ref 70–99)
Potassium: 4.4 mmol/L (ref 3.5–5.1)
Sodium: 142 mmol/L (ref 135–145)

## 2022-01-05 IMAGING — CT CT ABD-PELV W/O CM
2 of 4 series · 16 of 46 positions shown, 18 images · non-contrast
Comparison: PET-CT done on [DATE]

CLINICAL DATA: Abdominal pain, nausea, vomiting, diarrhea x3 weeks



[Series 2: axial st · axial · 0.90mm/px · z∈[+982,+1382]mm · 13 of 92 slices shown, 15 images]
[im 6/92  soft-tissue]
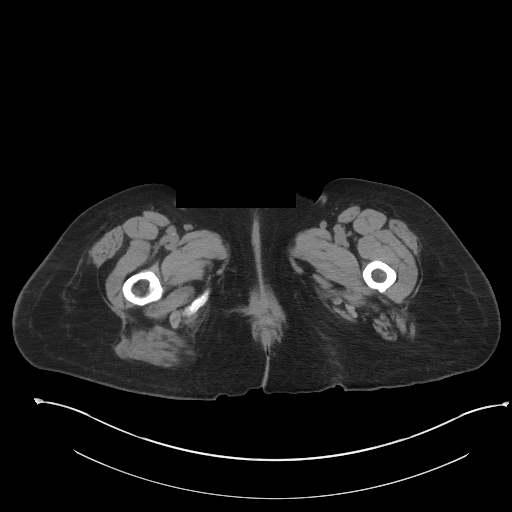
[im 6/92  bone]
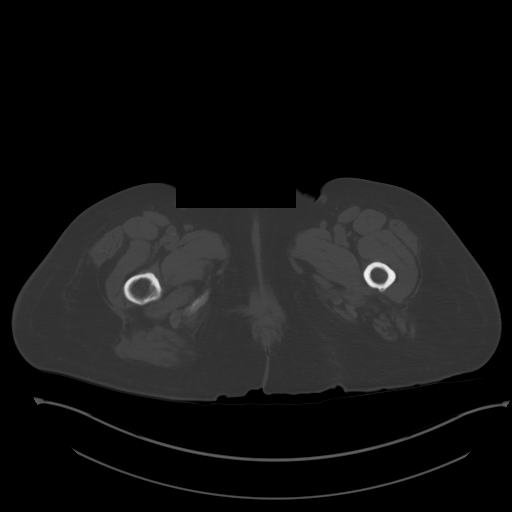
[im 11/92  soft-tissue]
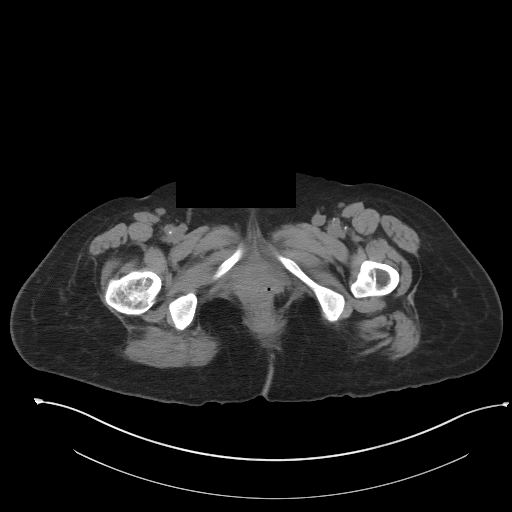
[im 22/92  soft-tissue]
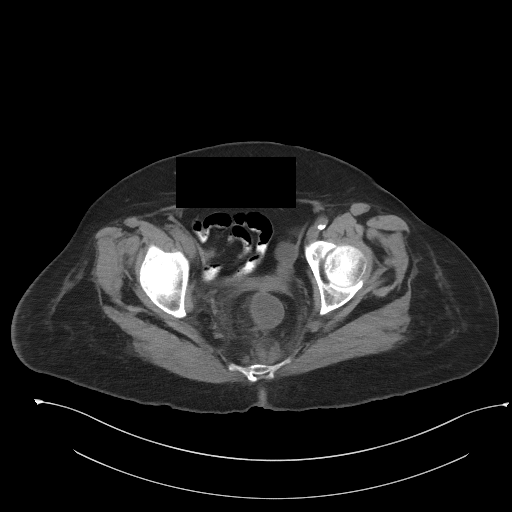
[im 27/92  soft-tissue]
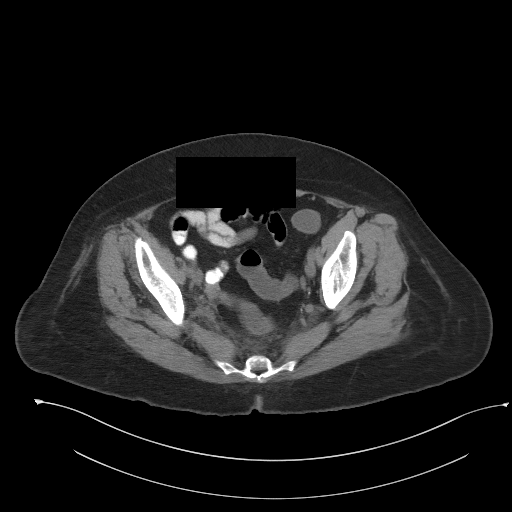
[im 33/92  soft-tissue]
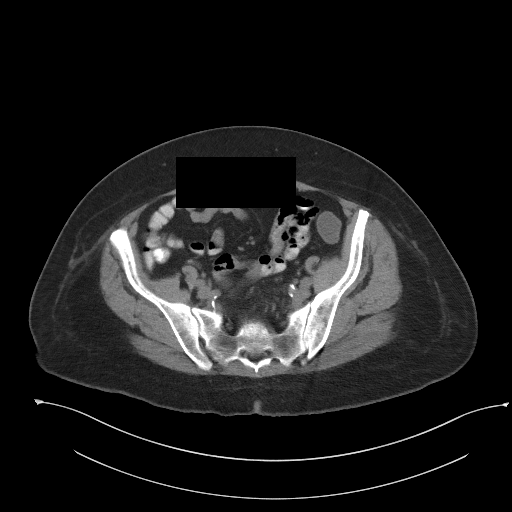
[im 38/92  soft-tissue]
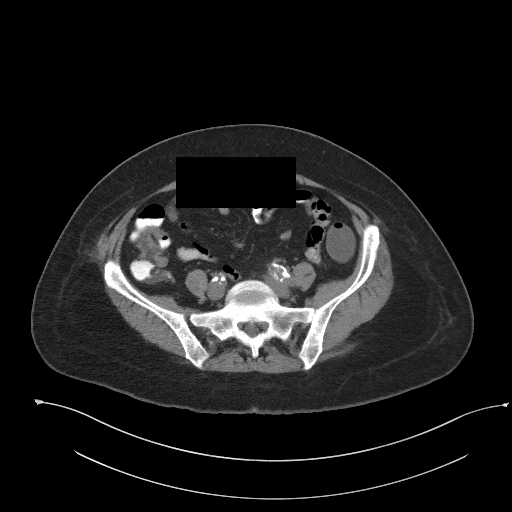
[im 49/92  soft-tissue]
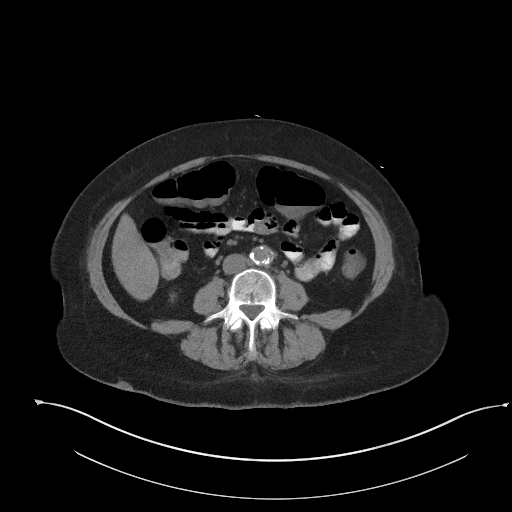
[im 54/92  soft-tissue]
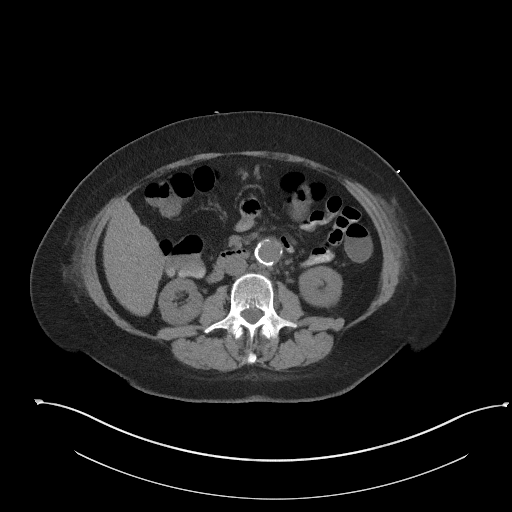
[im 59/92  soft-tissue]
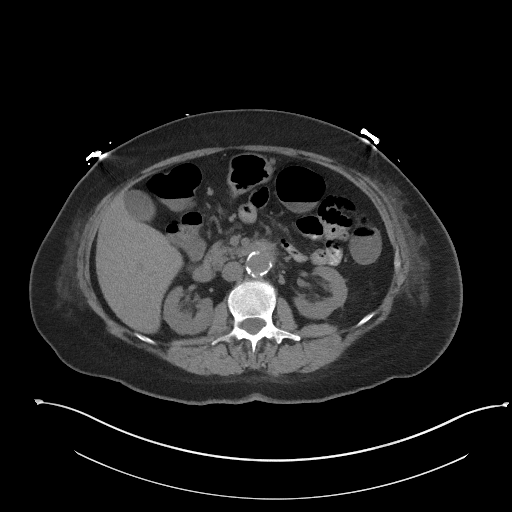
[im 59/92  bone]
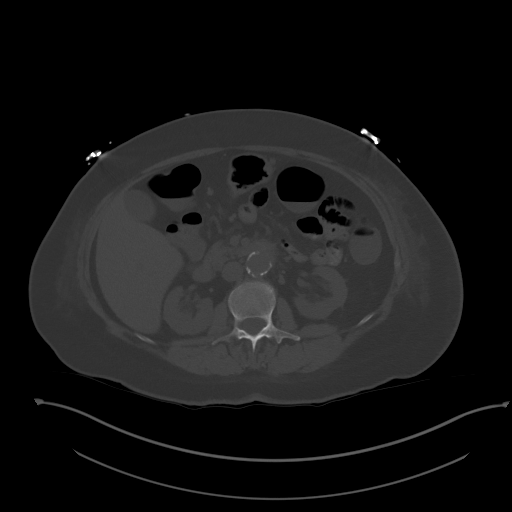
[im 65/92  soft-tissue]
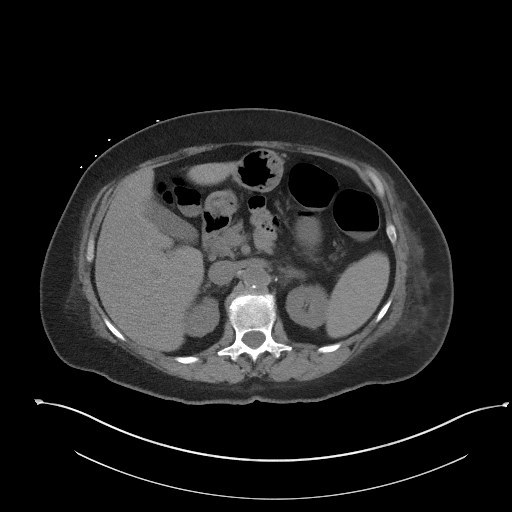
[im 70/92  soft-tissue]
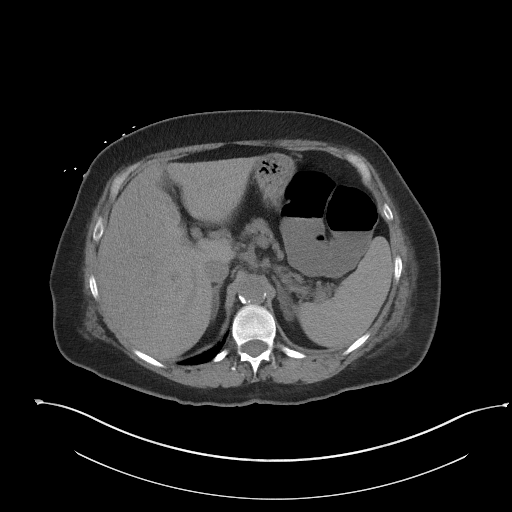
[im 81/92  soft-tissue]
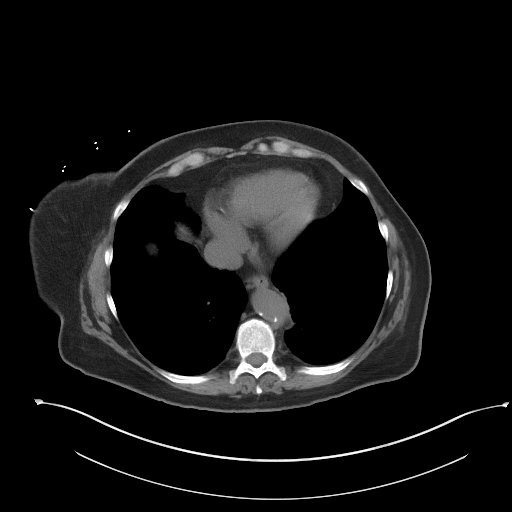
[im 86/92  soft-tissue]
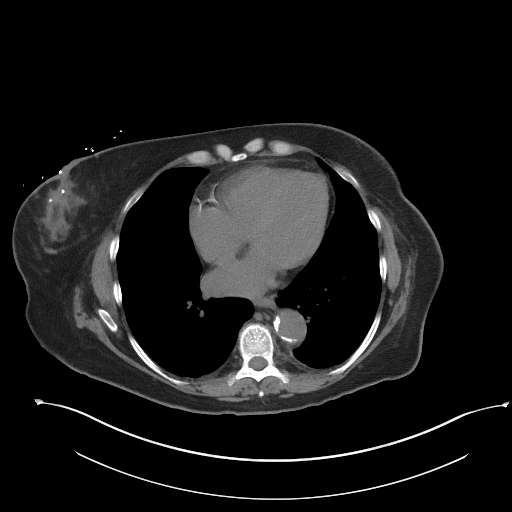

[Series 5: coronal st · coronal · 0.86mm/px · 3 of 121 slices shown]
[im 41/121  soft-tissue]
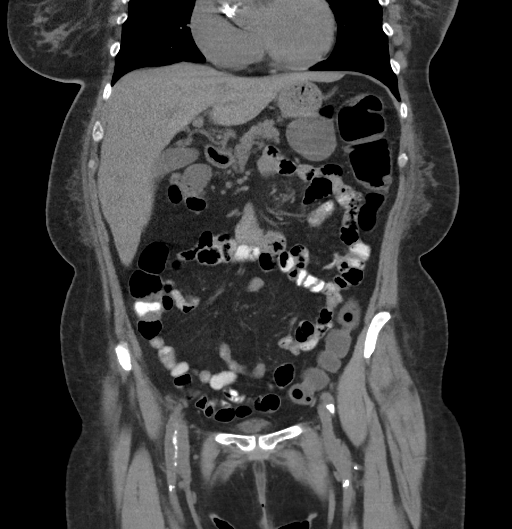
[im 54/121  soft-tissue]
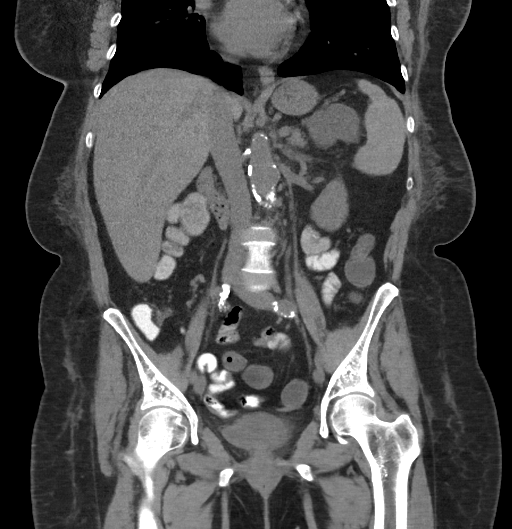
[im 67/121  soft-tissue]
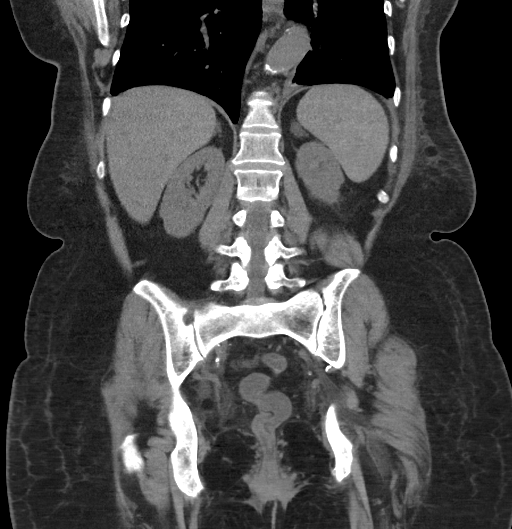

[16 of 46 positions shown; findings below may reference images not displayed]

FINDINGS: Lower chest: Minimal left pleural effusion is seen. There are
scattered coronary artery calcifications. There is skin thickening
in the visualized lower portion of left breast.

Hepatobiliary: Liver measures 19.1 cm. Evaluation is limited without
contrast enhancement. There is no dilation of bile ducts.
Gallbladder is unremarkable.

Pancreas: There is atrophy.  No focal abnormality is seen.

Spleen: Unremarkable.

Adrenals/Urinary Tract: Adrenals are unremarkable. There is no
hydronephrosis. There are no renal or ureteral stones. Urinary
bladder is almost completely empty limiting evaluation.

Stomach/Bowel: Stomach is unremarkable. Small bowel loops are not
dilated. Appendix is not dilated. There is no significant wall
thickening in colon. Liquid stool is seen in the lumen of left
colon. There is no pericolic stranding. There is stranding in the
fat planes adjacent to the posterior aspect of rectum. There is
x 2 cm subtle inhomogeneous increased density in the presacral
region posterior to the rectum. No loculated fluid collections are
seen in the pelvic cavity.

Vascular/Lymphatic: Extensive arterial calcifications are seen.
There is 3.3 cm infrarenal aortic aneurysm. No significant
lymphadenopathy in the mesentery. There are few subcentimeter nodes
in the posterior mediastinum behind the lower thoracic esophagus.

Reproductive: Uterus is not seen.

Other: There is no ascites or pneumoperitoneum. Umbilical hernia
containing fat is seen.

Musculoskeletal: No focal lytic or sclerotic lesions are seen in
bony structures.
IMPRESSION: There is fluid in the lumen of colon and rectum suggesting possible
nonspecific enterocolitis. There is no significant wall thickening
in colon.

There is no evidence of intestinal obstruction or pneumoperitoneum.
There is no hydronephrosis. Appendix is not dilated.

There is stranding in the perirectal fat plane, especially
posteriorly. There is patchy 3.7 cm density between sacrum and
rectum which may be suggestive of chronic inflammatory process.
There is no loculated perirectal fluid collection.

3.3 cm infrarenal aortic aneurysm is seen. Follow-up CT every 3
years may be considered.

Minimal left pleural effusion. There are scattered coronary artery
calcifications. Enlarged liver.

Other findings as described in the body of the report.

## 2022-01-05 MED ORDER — PANTOPRAZOLE SODIUM 40 MG IV SOLR
40.0000 mg | Freq: Two times a day (BID) | INTRAVENOUS | Status: DC
  Administered 2022-01-05 – 2022-01-07 (×4): 40 mg via INTRAVENOUS
  Filled 2022-01-05 (×4): qty 10

## 2022-01-05 MED ORDER — SODIUM BICARBONATE 8.4 % IV SOLN
INTRAVENOUS | Status: AC
  Filled 2022-01-05 (×2): qty 1000

## 2022-01-05 NOTE — Progress Notes (Signed)
?Progress Note ? ? ?Patient: Traci Fuller PYK:[id number] DOB: 08/28/1955 DOA: 01/01/2022     4 ?DOS: the patient was seen and examined on 01/05/2022 ?  ?Brief hospital course: ?AMAZING COWMAN is a 66 y.o. female with medical history significant of hypertension, hyperlipidemia, prior history of coronary artery disease with myocardial infarction, chronic kidney disease stage IIIb, breast cancer and tobacco abuse; who presented to the hospital from cancer center secondary to intractable nausea, vomiting, diarrhea and inability to keep things down.  Patient reports no hematemesis, hematochezia or melena.  Symptoms appears to be related to a side effect from ongoing chemotherapy.  Patient is afebrile. ?  ?Initial work-up demonstrated mild leukocytosis with concern for hemoconcentration, acute on chronic renal failure with a creatinine up to 3.08, severe hypokalemia and per patient active ongoing GI losses symptoms.  She has failed to respond to oral antiemetics (Compazine and Zofran) and antidiarrheal treatment (Lomotil/Imodium) as an outpatient.  [facility] has been contacted to place in the hospital for further evaluation and management. ? ?Assessment and Plan: ?* Acute on chronic renal failure (HCC) ?-Patient with a stage IIIb at baseline; presenting with worsening renal function and acute kidney injury in the setting of prerenal azotemia and dehydration. ?-Urinalysis without suspicious for UTI; but patient reporting some discomfort while urinating.  Urine will be checked for cultures. ?-Renal ultrasound demonstrating no obstructive uropathy and normal degree of bladder distention. ?-So far responding adequately to fluid resuscitation. ?-Creatinine down to 1.30 ?-Continue to avoid nephrotoxic agents, hypotension and the use of contrast. ?-Continue to follow renal function trend. ? ? ? ?Abdominal pain ?-Initially not complaining of abdominal pain; no having ongoing nausea, vomiting and diarrhea. ?-Given lack of  full resolution of her symptoms and now presentation of abdominal pain will get CT abdomen for further work-up. ?-As needed analgesics will be provided. ? ?Anemia due to chronic kidney disease ?-No overt bleeding appreciated ?-Use of chemotherapy most likely contributing to patient anemia ?-Hemoglobin 8.8; after receiving 1 unit PRBC transfusion on 01/03/2022. ?-Follow response/stability and hemoglobin trend. ? ?Thrombocytopenia ([place]) ?-Avoid the use of heparin products ?-SCDs for DVT prophylaxis. ?-No overt bleeding appreciated; follow platelet count trend. ?-Platelet count 131 K. ?-No signs of overt bleeding. ? ?Leukocytosis ?-Appears to be secondary to stress demargination and hemoconcentration from dehydration ?-WBCs within normal limits after fluid resuscitation initiated. ?-Continue to follow trend. ?-No identified source of infection currently. ?-Continue holding on antibiotics. ? ?Metabolic acidosis ?-In the setting of dehydration from diarrhea and acute on chronic renal failure ?-Bicarb 17. ?-Will add sodium bicarbonate infusion; and follow trend.  While taking serum bicarbonate by mouth patient be came really sick and started to vomiting. ? ?Hypokalemia ?-In the setting of GI losses and decreased oral intake ?-Safe to discontinue telemetry at this point.  Continue to follow electrolytes trend intermittently.  Replete as needed ?-K 4.4 currently. ? ?Nausea vomiting and diarrhea ?-Appears to be secondary to side effects from chemotherapy ?-Patient is afebrile and reports no abdominal pain, dysuria, hematuria or any other complaints to suggest active infection. ?-After fluid resuscitation WBCs within normal range.  Patient denies abdominal pain. ?-Continue supportive care and aggressive fluid resuscitation. ?-Continue as needed antiemetics. ?-Diet will be advanced to full liquid. ? ?Dehydration ?-In the setting of decreased ability to maintain adequate oral hydration due to uncontrolled nausea/vomiting and  diarrhea. ?-Continue fluid resuscitation, as needed antiemetics, the use of Lomotil and as needed Imodium. ?-Follow clinical response.   ? ?Breast cancer, left ([place]) ?- Patient  has completed 3 cycles of chemotherapy so far. ?-Continue outpatient follow-up with oncology service. ?-Stage IIa-III, locally advanced oligometastatic left breast cancer (cT3, CN I, cM1, G2, ER positive, PR positive, HER 2+). ? ?Essential hypertension ?-Stable overall ?-Continue to follow vital signs; holding antihypertensive agents in the setting of soft blood pressure. ? ?CAD S/P percutaneous coronary angioplasty ?-No chest pain or shortness of breath ?-Continue with the use of aspirin. ?-Due to thrombocytopenia Brilinta has been placed on hold and given soft BP will also continue to hold b-blocker. ? ?Hyperlipidemia LDL goal <70 ?-Continue the use of Repatha as an outpatient. ? ?Smoker ?-Cessation counseling provided ?-Nicotine patch ordered. ? ? ?Subjective:  ?Reports having rough time with ongoing nausea/active vomiting and diarrhea.  Also complaining of abdominal pain.  Not tolerating diet well. ? ?Physical Exam: ?Vitals:  ? 01/04/22 1335 01/04/22 2141 01/05/22 0557 01/05/22 1406  ?BP: (!) 142/68 132/72 135/70 132/78  ?Pulse: 78 83 79 76  ?Resp: '20 18 18 18  '$ ?Temp: 98.4 ?F (36.9 ?C) 98 ?F (36.7 ?C) 97.7 ?F (36.5 ?C) 98.4 ?F (36.9 ?C)  ?TempSrc: Oral     ?SpO2: 100% 98% 100% 100%  ?Weight:      ?Height:      ? ?General exam: Alert, awake, oriented x 3; reports having a rough day with reactivation of vomiting episodes and continuation of diarrhea.  Still not tolerating diet well.  No fever. ?Respiratory system: Clear to auscultation. Respiratory effort normal.  Good saturation on room air. ?Cardiovascular system:RRR. No murmurs, rubs, gallops.  No JVD. ?Gastrointestinal system: Abdomen is nondistended, soft and vaguely tender to palpation.  Positive bowel sounds appreciated throughout.  No guarding. ?Central nervous system: Alert and  oriented. No focal neurological deficits. ?Extremities: No cyanosis, clubbing or edema. ?Skin: No petechiae. ?Psychiatry: Judgement and insight appear normal. Mood & affect appropriate.  ? ? ?Data Reviewed: ?Basic metabolic panel demonstrating sodium 142, chloride 122, CO2 17, BUN 12 and Cr 1.30 ?Anion gap 3; GFR 46 ? ?Family Communication: Husband at bedside. ? ?Disposition: ?Status is: Inpatient ?Remains inpatient appropriate because: Continue fluid resuscitation, electrolyte repletion and follow renal function trend.  Still having inability to tolerate oral intake.  Patient reports ongoing diarrhea. ? ? Planned Discharge Destination: Home ? ? ?Author: ?Barton Dubois, MD ?01/05/2022 5:43 PM ? ?For on call review [email or web address].  ?

## 2022-01-05 NOTE — Assessment & Plan Note (Signed)
-  Initially not complaining of abdominal pain; no having ongoing nausea, vomiting and diarrhea. ?-Given lack of full resolution of her symptoms and now presentation of abdominal pain will get CT abdomen for further work-up. ?-As needed analgesics will be provided. ?

## 2022-01-06 ENCOUNTER — Inpatient Hospital Stay (HOSPITAL_COMMUNITY): Payer: Managed Care, Other (non HMO)

## 2022-01-06 ENCOUNTER — Inpatient Hospital Stay (HOSPITAL_COMMUNITY): Payer: Managed Care, Other (non HMO) | Admitting: Hematology

## 2022-01-06 DIAGNOSIS — N1831 Chronic kidney disease, stage 3a: Secondary | ICD-10-CM

## 2022-01-06 DIAGNOSIS — N390 Urinary tract infection, site not specified: Secondary | ICD-10-CM

## 2022-01-06 DIAGNOSIS — E872 Acidosis, unspecified: Secondary | ICD-10-CM | POA: Diagnosis not present

## 2022-01-06 DIAGNOSIS — N170 Acute kidney failure with tubular necrosis: Secondary | ICD-10-CM

## 2022-01-06 DIAGNOSIS — R112 Nausea with vomiting, unspecified: Secondary | ICD-10-CM | POA: Diagnosis not present

## 2022-01-06 DIAGNOSIS — E876 Hypokalemia: Secondary | ICD-10-CM | POA: Diagnosis not present

## 2022-01-06 DIAGNOSIS — N342 Other urethritis: Secondary | ICD-10-CM

## 2022-01-06 DIAGNOSIS — N3000 Acute cystitis without hematuria: Secondary | ICD-10-CM

## 2022-01-06 LAB — BASIC METABOLIC PANEL
Anion gap: 3 — ABNORMAL LOW (ref 5–15)
BUN: 8 mg/dL (ref 8–23)
CO2: 23 mmol/L (ref 22–32)
Calcium: 8.2 mg/dL — ABNORMAL LOW (ref 8.9–10.3)
Chloride: 114 mmol/L — ABNORMAL HIGH (ref 98–111)
Creatinine, Ser: 1.21 mg/dL — ABNORMAL HIGH (ref 0.44–1.00)
GFR, Estimated: 50 mL/min — ABNORMAL LOW (ref >60)
Glucose, Bld: 107 mg/dL — ABNORMAL HIGH (ref 70–99)
Potassium: 3.7 mmol/L (ref 3.5–5.1)
Sodium: 140 mmol/L (ref 135–145)

## 2022-01-06 LAB — URINE CULTURE: Culture: >=100000 — AB

## 2022-01-06 MED ORDER — CEFAZOLIN SODIUM-DEXTROSE 1-4 GM/50ML-% IV SOLN
1.0000 g | Freq: Three times a day (TID) | INTRAVENOUS | Status: DC
  Administered 2022-01-06 – 2022-01-07 (×4): 1 g via INTRAVENOUS
  Filled 2022-01-06 (×7): qty 50

## 2022-01-06 MED ORDER — POTASSIUM CHLORIDE IN NACL 20-0.9 MEQ/L-% IV SOLN: INTRAVENOUS | Status: DC

## 2022-01-06 MED ORDER — METOPROLOL TARTRATE 25 MG PO TABS
12.5000 mg | ORAL_TABLET | Freq: Two times a day (BID) | ORAL | Status: DC
  Administered 2022-01-06 – 2022-01-07 (×2): 12.5 mg via ORAL
  Filled 2022-01-06 (×2): qty 1

## 2022-01-06 NOTE — Hospital Course (Signed)
Traci Fuller is a 66 y.o. female with medical history significant of hypertension, hyperlipidemia, prior history of coronary artery disease with myocardial infarction, chronic kidney disease stage IIIb, breast cancer and tobacco abuse; who presented to the hospital from cancer center secondary to intractable nausea, vomiting, diarrhea and inability to keep things down.  Patient reports no hematemesis, hematochezia or melena.  Symptoms appears to be related to a side effect from ongoing chemotherapy.  Patient is afebrile. ?  ?Initial work-up demonstrated mild leukocytosis with concern for hemoconcentration, acute on chronic renal failure with a creatinine up to 3.08, severe hypokalemia and per patient active ongoing GI losses symptoms.  She has failed to respond to oral antiemetics (Compazine and Zofran) and antidiarrheal treatment (Lomotil/Imodium) as an outpatient.  [facility] has been contacted to place in the hospital for further evaluation and management. ?

## 2022-01-06 NOTE — Progress Notes (Signed)
?  ?       ?PROGRESS NOTE ? ?Traci Fuller JEH:[id number] DOB: 10/20/1955 DOA: 01/01/2022 ?PCP: Celene Squibb, MD ? ?Brief History:  ?Traci Fuller is a 66 y.o. female with medical history significant of hypertension, hyperlipidemia, prior history of coronary artery disease with myocardial infarction, chronic kidney disease stage IIIb, breast cancer and tobacco abuse; who presented to the hospital from cancer center secondary to intractable nausea, vomiting, diarrhea and inability to keep things down.  Patient reports no hematemesis, hematochezia or melena.  Symptoms appears to be related to a side effect from ongoing chemotherapy.  Patient is afebrile. ?  ?Initial work-up demonstrated mild leukocytosis with concern for hemoconcentration, acute on chronic renal failure with a creatinine up to 3.08, severe hypokalemia and per patient active ongoing GI losses symptoms.  She has failed to respond to oral antiemetics (Compazine and Zofran) and antidiarrheal treatment (Lomotil/Imodium) as an outpatient.  [facility] has been contacted to place in the hospital for further evaluation and management.  ? ? ? ?Assessment and Plan: ?* Acute on chronic renal failure (HCC) ?-Patient with a stage IIIb at baseline; presenting with worsening renal function and acute kidney injury in the setting of prerenal azotemia and dehydration. ?-baseline creatinine 1.1-1.4 ?-Serum creatinine peaked 3.08 ?-Urinalysis without suspicious for UTI; but patient reporting some discomfort while urinating.  Urine will be checked for cultures. ?-Renal ultrasound demonstrating no obstructive uropathy and normal degree of bladder distention. ?-improving with IVF ? ? ?UTI (urinary tract infection) ?UA 21-50WBC ?Urine culture--E coli ?-start cefazolin ? ?Anemia due to chronic kidney disease ?-No overt bleeding appreciated ?-Use of chemotherapy most likely contributing to patient anemia ?-Hemoglobin 8.8; after receiving 1 unit PRBC transfusion on  01/03/2022. ?-Follow response/stability and hemoglobin trend. ? ?Thrombocytopenia ([place]) ?-Avoid the use of heparin products ?-SCDs for DVT prophylaxis. ?-No overt bleeding appreciated; follow platelet count trend. ?-Platelet count 131 K. ?-No signs of overt bleeding. ? ?Leukocytosis ?-Appears to be secondary to stress demargination and hemoconcentration from dehydration ?-WBCs within normal limits after fluid resuscitation initiated. ?-Continue to follow trend. ? ?Metabolic acidosis ?-In the setting of dehydration from diarrhea and acute on chronic renal failure ?-NAG metabolic acidosis due to diarrhea ?-added sodium bicarbonate infusion; and follow trend.  While taking serum bicarbonate by mouth patient be came really sick and started to vomiting. ?-bicarb improved>>d/c bicarb drip ? ?Hypokalemia ?-In the setting of GI losses and decreased oral intake ?-Safe to discontinue telemetry at this point.  Continue to follow electrolytes trend intermittently.  ?Replete as needed ? ? ?Nausea vomiting and diarrhea ?-Appears to be secondary to side effects from chemotherapy ?-stool pathogen panel ?-Patient is afebrile and reports no abdominal pain, dysuria, hematuria or any other complaints to suggest active infection. ?-After fluid resuscitation WBCs within normal range.  Patient denies abdominal pain. ?-5/16 CT abd--fluid in colon and rectum, stranding in perirectal fat plane with patchy density between sacrum and rectum ?-general surgery consult ? ?Dehydration ?-In the setting of decreased ability to maintain adequate oral hydration due to uncontrolled nausea/vomiting and diarrhea. ?-Continue fluid resuscitation, as needed antiemetics, the use of Lomotil and as needed Imodium. ?-Follow clinical response.   ? ?Breast cancer, left ([place]) ?- Patient has completed 3 cycles of chemotherapy so far. ?-Continue outpatient follow-up with oncology service. ?-Stage IIa-III, locally advanced oligometastatic left breast cancer (cT3, CN  I, cM1, G2, ER positive, PR positive, HER 2+). ? ?Essential hypertension ?-Stable overall ?-Continue to follow vital signs; holding antihypertensive agents in the  setting of soft blood pressure. ? ?CAD S/P percutaneous coronary angioplasty ?-No chest pain or shortness of breath ?-Continue with the use of aspirin. ?-Due to thrombocytopenia Brilinta has been placed on hold and given soft BP will also continue to hold b-blocker. ?-restart metoprolol with improved BP ? ?Hyperlipidemia LDL goal <70 ?-Continue the use of Repatha as an outpatient. ? ?Smoker ?-Cessation counseling provided ?-Nicotine patch ordered. ? ? ? ? ? ? ?Family Communication:   daughter updated at bedside ? ?Consultants:  gen surgery ? ?Code Status:  FULL ? ?DVT Prophylaxis:  SCDs ? ? ?Procedures: ?As Listed in Progress Note Above ? ?Antibiotics: ?None ? ? ? ? ? ?Subjective: ?Patient states that vomiting and diarrhea are improved today.  She denies any abdominal pain.  She denies any fevers, chills, chest pain, shortness of breath, emesis.  There is no coughing or hemoptysis.  She has some dysuria. ? ?Objective: ?Vitals:  ? 01/05/22 0557 01/05/22 1406 01/05/22 2141 01/06/22 0443  ?BP: 135/70 132/78 113/64 127/66  ?Pulse: 79 76 81 71  ?Resp: '18 18 18 18  '$ ?Temp: 97.7 ?F (36.5 ?C) 98.4 ?F (36.9 ?C) 98.3 ?F (36.8 ?C) 98.1 ?F (36.7 ?C)  ?TempSrc:   Oral   ?SpO2: 100% 100% 98% 98%  ?Weight:      ?Height:      ? ? ?Intake/Output Summary (Last 24 hours) at 01/06/2022 1329 ?Last data filed at 01/06/2022 0500 ?Gross per 24 hour  ?Intake 646.73 ml  ?Output --  ?Net 646.73 ml  ? ?Weight change:  ?Exam: ? ?General:  Pt is alert, follows commands appropriately, not in acute distress ?HEENT: No icterus, No thrush, No neck mass, [place]/AT ?Cardiovascular: RRR, S1/S2, no rubs, no gallops ?Respiratory: CTA bilaterally, no wheezing, no crackles, no rhonchi ?Abdomen: Soft/+BS, non tender, non distended, no guarding ?Extremities: No edema, No lymphangitis, No petechiae, No  rashes, no synovitis ? ? ?Data Reviewed: ?I have personally reviewed following labs and imaging studies ?Basic Metabolic Panel: ?Recent Labs  ?Lab 01/01/22 ?0921 01/01/22 ?1621 01/02/22 ?3086 01/03/22 ?5784 01/04/22 ?6962 01/05/22 ?9528 01/06/22 ?4132  ?NA 136  --  138 141 141 142 140  ?K 2.0*  --  2.6* 3.4* 3.8 4.4 3.7  ?CL 104  --  113* 120* 123* 122* 114*  ?CO2 20*  --  19* 16* 15* 17* 23  ?GLUCOSE 159*  --  110* 113* 106* 94 107*  ?BUN 46*  --  39* 28* '18 12 8  '$ ?CREATININE 3.08*  --  2.21* 1.97* 1.63* 1.30* 1.21*  ?CALCIUM 9.9  --  9.1 9.1 9.0 8.8* 8.2*  ?MG 1.9  --   --   --   --   --   --   ?PHOS  --  3.1  --   --   --   --   --   ? ?Liver Function Tests: ?Recent Labs  ?Lab 01/01/22 ?0921 01/02/22 ?4401  ?AST 18 12*  ?ALT 20 15  ?ALKPHOS 246* 170*  ?BILITOT 0.8 0.6  ?PROT 7.5 5.8*  ?ALBUMIN 4.1 3.2*  ? ?No results for input(s): LIPASE, AMYLASE in the last 168 hours. ?No results for input(s): AMMONIA in the last 168 hours. ?Coagulation Profile: ?No results for input(s): INR, PROTIME in the last 168 hours. ?CBC: ?Recent Labs  ?Lab 01/01/22 ?0921 01/02/22 ?0272 01/02/22 ?0731 01/03/22 ?0500 01/04/22 ?0540  ?WBC 12.6* 9.4 9.2 8.9 10.9*  ?HGB 10.3* 7.2* 7.1* 6.8* 8.8*  ?HCT 30.9* 22.1* 22.1* 22.1* 28.9*  ?MCV 99.4  102.3* 102.8* 105.7* 103.2*  ?PLT 79* 70* 74* 92* 131*  ? ?Cardiac Enzymes: ?No results for input(s): CKTOTAL, CKMB, CKMBINDEX, TROPONINI in the last 168 hours. ?BNP: ?Invalid input(s): POCBNP ?CBG: ?No results for input(s): GLUCAP in the last 168 hours. ?HbA1C: ?No results for input(s): HGBA1C in the last 72 hours. ?Urine analysis: ?   ?Component Value Date/Time  ? COLORURINE YELLOW 01/02/2022 0530  ? APPEARANCEUR HAZY (A) 01/02/2022 0530  ? LABSPEC 1.018 01/02/2022 0530  ? PHURINE 5.0 01/02/2022 0530  ? GLUCOSEU NEGATIVE 01/02/2022 0530  ? HGBUR NEGATIVE 01/02/2022 0530  ? BILIRUBINUR NEGATIVE 01/02/2022 0530  ? KETONESUR 5 (A) 01/02/2022 0530  ? PROTEINUR 100 (A) 01/02/2022 0530  ? NITRITE NEGATIVE  01/02/2022 0530  ? LEUKOCYTESUR SMALL (A) 01/02/2022 0530  ? ?Sepsis Labs: ?'@LABRCNTIP'$ (WVTVNRWCHJSCB:8,[id number]:8) ?) ?Recent Results (from the past 240 hour(s))  ?Urine Culture     Status: Abnormal  ? Collection

## 2022-01-06 NOTE — Progress Notes (Signed)
[facility] ? ?Patient known to me with advanced breast cancer s/p port placement and admitted from chemotherapy induced diarrhea. On her CT that is a collection in the presacral space with fat stranding around the rectum. She also has some area similar on her PET scan from March. ? ?I will review with radiology tomorrow. I do not think there is any intervention at this time but may have to consider further imaging.  Will follow up and see patient tomorrow.  ? ?Curlene Labrum, MD ?[facility] ?[place], [place] [address] ?[phone] (office) ? ?

## 2022-01-06 NOTE — Assessment & Plan Note (Addendum)
UA 21-50WBC Urine culture--E coli -started cefazolin>>d/c home with 3 more days cephalexin

## 2022-01-06 NOTE — TOC Initial Note (Signed)
Transition of Care (TOC) - Initial/Assessment Note  ? ? ?Patient Details  ?Name: Traci Fuller ?MRN: [id number] ?Date of Birth: 05/02/1956 ? ?Transition of Care (TOC) CM/SW Contact:    ?Salome Arnt, LCSW ?Phone Number: ?01/06/2022, 8:18 AM ? ?Clinical Narrative:  Pt admitted with acute on chronic renal failure. Assessment completed due to high risk readmission score. Pt reports she lives with her husband, daughter, and 3 grandchildren. She is independent with ADLs. Pt's daughter provides transportation to appointments. She indicates she has been receiving chemo every 3 weeks for breast cancer. Pt plans to return home when medically stable. No needs reported at this time. TOC will continue to follow.                 ? ? ?Expected Discharge Plan: Home/Self Care ?Barriers to Discharge: Continued Medical Work up ? ? ?Patient Goals and CMS Choice ?Patient states their goals for this hospitalization and ongoing recovery are:: return home ?  ?Choice offered to / list presented to : Patient ? ?Expected Discharge Plan and Services ?Expected Discharge Plan: Home/Self Care ?In-house Referral: Clinical Social Work ?  ?  ?Living arrangements for the past 2 months: [place] ?                ?  ?  ?  ?  ?  ?  ?  ?  ?  ?  ? ?Prior Living Arrangements/Services ?Living arrangements for the past 2 months: [place] ?Lives with:: Spouse, Adult Children ?Patient language and need for interpreter reviewed:: Yes ?Do you feel safe going back to the place where you live?: Yes      ?Need for Family Participation in Patient Care: No (Comment) ?  ?  ?Criminal Activity/Legal Involvement Pertinent to Current Situation/Hospitalization: No - Comment as needed ? ?Activities of Daily Living ?Home Assistive Devices/Equipment: None ?ADL Screening (condition at time of admission) ?Patient's cognitive ability adequate to safely complete daily activities?: Yes ?Is the patient deaf or have difficulty hearing?: No ?Does  the patient have difficulty seeing, even when wearing glasses/contacts?: No ?Does the patient have difficulty concentrating, remembering, or making decisions?: No ?Patient able to express need for assistance with ADLs?: Yes ?Does the patient have difficulty dressing or bathing?: No ?Independently performs ADLs?: Yes (appropriate for developmental age) ?Does the patient have difficulty walking or climbing stairs?: No ?Weakness of Legs: None ?Weakness of Arms/Hands: None ? ?Permission Sought/Granted ?  ?  ?   ?   ?   ?   ? ?Emotional Assessment ?  ?  ?Affect (typically observed): Appropriate ?Orientation: : Oriented to Self, Oriented to Place, Oriented to  Time, Oriented to Situation ?Alcohol / Substance Use: Not Applicable ?Psych Involvement: No (comment) ? ?Admission diagnosis:  Acute on chronic renal failure (HCC) [N17.9, N18.9] ?Patient Active Problem List  ? Diagnosis Date Noted  ? Abdominal pain 01/05/2022  ? Dehydration 01/01/2022  ? Nausea vomiting and diarrhea 01/01/2022  ? Hypokalemia 01/01/2022  ? Metabolic acidosis 27/01/2375  ? Leukocytosis 01/01/2022  ? Thrombocytopenia ([place]) 01/01/2022  ? Anemia due to chronic kidney disease 01/01/2022  ? Acute on chronic renal failure ([place]) 01/01/2022  ? Genetic testing 11/03/2021  ? Port-A-Cath in place 10/29/2021  ? Family history of breast cancer 10/22/2021  ? Family history of uterine cancer 10/22/2021  ? Family history of bladder cancer 10/22/2021  ? Family history of leukemia 10/22/2021  ? Metastatic breast cancer 10/15/2021  ? Breast cancer, left ([place]) 10/15/2021  ?  CAD S/P percutaneous coronary angioplasty 01/19/2018  ? Essential hypertension 01/19/2018  ? Smoker 01/12/2018  ? Hyperlipidemia LDL goal <70 01/12/2018  ? Acute ST elevation myocardial infarction (STEMI) of anterior wall ([place]) 01/10/2018  ? Unstable angina ([place]) 01/10/2018  ? ?PCP:  Celene Squibb, MD ?Pharmacy:   ?[facility] [place] [facility] [place] ?[address] ?[place] [address] ?Phone: [phone] Fax: [phone] ? ? ? ? ?Social Determinants of Health (SDOH) Interventions ?  ? ?Readmission Risk Interventions ? ?  01/06/2022  ?  8:14 AM  ?Readmission Risk Prevention Plan  ?Transportation Screening Complete  ?[place] or Home Care Consult Complete  ?Social Work Consult for [place] Planning/Counseling Complete  ?Palliative Care Screening Not Applicable  ?Medication Review Press photographer) Complete  ? ? ? ?

## 2022-01-07 DIAGNOSIS — N170 Acute kidney failure with tubular necrosis: Secondary | ICD-10-CM | POA: Diagnosis not present

## 2022-01-07 DIAGNOSIS — E876 Hypokalemia: Secondary | ICD-10-CM | POA: Diagnosis not present

## 2022-01-07 DIAGNOSIS — N1831 Chronic kidney disease, stage 3a: Secondary | ICD-10-CM | POA: Diagnosis not present

## 2022-01-07 DIAGNOSIS — R112 Nausea with vomiting, unspecified: Secondary | ICD-10-CM | POA: Diagnosis not present

## 2022-01-07 LAB — GASTROINTESTINAL PANEL BY PCR, STOOL (REPLACES STOOL CULTURE)

## 2022-01-07 LAB — CBC
HCT: 25.4 % — ABNORMAL LOW (ref 36.0–46.0)
Hemoglobin: 7.8 g/dL — ABNORMAL LOW (ref 12.0–15.0)
MCH: 31.5 pg (ref 26.0–34.0)
MCHC: 30.7 g/dL (ref 30.0–36.0)
MCV: 102.4 fL — ABNORMAL HIGH (ref 80.0–100.0)
Platelets: 222 10*3/uL (ref 150–400)
RBC: 2.48 MIL/uL — ABNORMAL LOW (ref 3.87–5.11)
RDW: 22.2 % — ABNORMAL HIGH (ref 11.5–15.5)
WBC: 7.2 10*3/uL (ref 4.0–10.5)
nRBC: 0 % (ref 0.0–0.2)

## 2022-01-07 LAB — BASIC METABOLIC PANEL
Anion gap: 6 (ref 5–15)
BUN: 7 mg/dL — ABNORMAL LOW (ref 8–23)
CO2: 23 mmol/L (ref 22–32)
Calcium: 7.7 mg/dL — ABNORMAL LOW (ref 8.9–10.3)
Chloride: 112 mmol/L — ABNORMAL HIGH (ref 98–111)
Creatinine, Ser: 1.21 mg/dL — ABNORMAL HIGH (ref 0.44–1.00)
GFR, Estimated: 50 mL/min — ABNORMAL LOW (ref >60)
Glucose, Bld: 89 mg/dL (ref 70–99)
Potassium: 3.9 mmol/L (ref 3.5–5.1)
Sodium: 141 mmol/L (ref 135–145)

## 2022-01-07 MED ORDER — CEPHALEXIN 500 MG PO CAPS: 500.0000 mg | ORAL_CAPSULE | Freq: Three times a day (TID) | ORAL | 0 refills | Status: DC

## 2022-01-07 MED ORDER — HEPARIN SOD (PORK) LOCK FLUSH 100 UNIT/ML IV SOLN
500.0000 [IU] | INTRAVENOUS | Status: AC | PRN
  Administered 2022-01-07: 500 [IU]
  Filled 2022-01-07: qty 5

## 2022-01-07 MED ORDER — METOPROLOL TARTRATE 25 MG PO TABS: 25.0000 mg | ORAL_TABLET | Freq: Two times a day (BID) | ORAL | Status: DC

## 2022-01-07 MED ORDER — CEPHALEXIN 500 MG PO CAPS: 500.0000 mg | ORAL_CAPSULE | Freq: Three times a day (TID) | ORAL | Status: DC

## 2022-01-07 NOTE — Evaluation (Signed)
Physical Therapy Evaluation Patient Details Name: Traci Fuller MRN: [id number] DOB: 08-24-1955 Today's Date: 01/07/2022  History of Present Illness  Traci Fuller is a 66 y.o. female with medical history significant of hypertension, hyperlipidemia, prior history of coronary artery disease with myocardial infarction, chronic kidney disease stage IIIb, breast cancer and tobacco abuse; who presented to the hospital from cancer center secondary to intractable nausea, vomiting, diarrhea and inability to keep things down.  Patient reports no hematemesis, hematochezia or melena.  Symptoms appears to be related to a side effect from ongoing chemotherapy.  Patient is afebrile.   Clinical Impression  Patient evaluated by Physical Therapy with no further acute PT needs identified. All education has been completed and the patient has no further questions. Patient performed near baseline levels considering her chemotherapy treatments. Patient without significant losses of balance and good endurance for household distance ambulation without an assistive device.   See below for any follow-up Physical Therapy or equipment needs. PT is signing off. Thank you for this referral..      Recommendations for follow up therapy are one component of a multi-disciplinary discharge planning process, led by the attending physician.  Recommendations may be updated based on patient status, additional functional criteria and insurance authorization.  Follow Up Recommendations No PT follow up    Assistance Recommended at Discharge PRN  Patient can return home with the following  Assist for transportation;Help with stairs or ramp for entrance    Equipment Recommendations None recommended by PT  Recommendations for Other Services       Functional Status Assessment Patient has had a recent decline in their functional status and demonstrates the ability to make significant improvements in function in a reasonable  and predictable amount of time.     Precautions / Restrictions Precautions Precautions: Fall Precaution Comments: x1 fell back on toilet connected to GI disturbance Restrictions Weight Bearing Restrictions: No      Mobility  Bed Mobility Overal bed mobility: Modified Independent   General bed mobility comments: increased time    Transfers Overall transfer level: Modified independent     General transfer comment: increase time    Ambulation/Gait Ambulation/Gait assistance: Supervision Gait Distance (Feet): 75 Feet Assistive device: None Gait Pattern/deviations: Step-through pattern, Decreased step length - right, Decreased step length - left, Decreased stride length Gait velocity: decreased     General Gait Details: somewhat slow cadence without evidence of fatigue, no assistive device, patient had one minor steppage strategy balance challenge when turning and looking side to side while conversing; on room air  Stairs            Wheelchair Mobility    Modified Rankin (Stroke Patients Only)       Balance Overall balance assessment: Mild deficits observed, not formally tested         Pertinent Vitals/Pain Pain Assessment Pain Assessment: No/denies pain    Home Living       Prior Function         Hand Dominance        Extremity/Trunk Assessment   Upper Extremity Assessment Upper Extremity Assessment: Overall WFL for tasks assessed    Lower Extremity Assessment Lower Extremity Assessment: Overall WFL for tasks assessed    Cervical / Trunk Assessment Cervical / Trunk Assessment: Normal  Communication      Cognition Arousal/Alertness: Awake/alert Behavior During Therapy: WFL for tasks assessed/performed Overall Cognitive Status: Within Functional Limits for tasks assessed  General Comments      Exercises     Assessment/Plan    PT Assessment Patient needs continued PT services  PT Problem List Decreased  mobility;Decreased knowledge of use of DME;Decreased balance;Decreased activity tolerance       PT Treatment Interventions DME instruction;Therapeutic exercise;Gait training;Balance training;Neuromuscular re-education;Patient/family education;Therapeutic activities;Functional mobility training    PT Goals (Current goals can be found in the Care Plan section)       Frequency Min 3X/week        AM-PAC PT "6 Clicks" Mobility  Outcome Measure Help needed turning from your back to your side while in a flat bed without using bedrails?: None Help needed moving from lying on your back to sitting on the side of a flat bed without using bedrails?: None Help needed moving to and from a bed to a chair (including a wheelchair)?: None Help needed standing up from a chair using your arms (e.g., wheelchair or bedside chair)?: None Help needed to walk in hospital room?: None Help needed climbing 3-5 steps with a railing? : A Little 6 Click Score: 23    End of Session   Activity Tolerance: Patient tolerated treatment well Patient left: in bed;with call bell/phone within reach Nurse Communication: Mobility status PT Visit Diagnosis: Unsteadiness on feet (R26.81);Other abnormalities of gait and mobility (R26.89)    Time: 6979-4801 PT Time Calculation (min) (ACUTE ONLY): 23 min   Charges:   PT Evaluation $PT Eval Low Complexity: 1 Low PT Treatments $Therapeutic Activity: 8-22 mins        Floria Raveling. Hartnett-Rands, MS, PT Per [place] [phone]  [name] 01/07/2022, 9:41 AM

## 2022-01-07 NOTE — Progress Notes (Signed)
[facility]  Reviewed CT and prior PET with radiologist. 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   She will end up getting more PETs scans/ imaging as Dr. Delton Coombes plans to restage her so this can be followed on these images.  Nothing acute to do, nothing to drain. Updated Dr. Carles Collet. No official consult given no try need for any treatment or intervention from me at this time. No billable service.   Curlene Labrum, MD [facility] [address] [place],  [address] [phone] (office)

## 2022-01-07 NOTE — Discharge Summary (Addendum)
Physician Discharge Summary   Patient: Traci Fuller MRN: [id number] DOB: Feb 13, 1956  Admit date:     01/01/2022  Discharge date: 01/07/22  Discharge Physician: Shanon Brow Tobi Groesbeck   PCP: Celene Squibb, MD   Recommendations at discharge:   Please follow up with primary care provider within 1-2 weeks  Please repeat BMP and CBC in one week     Hospital Course: Traci Fuller is a 66 y.o. female with medical history significant of hypertension, hyperlipidemia, prior history of coronary artery disease with myocardial infarction, chronic kidney disease stage IIIb, breast cancer and tobacco abuse; who presented to the hospital from cancer center secondary to intractable nausea, vomiting, diarrhea and inability to keep things down.  Patient reports no hematemesis, hematochezia or melena.  Symptoms appears to be related to a side effect from ongoing chemotherapy.  Patient is afebrile.   Initial work-up demonstrated mild leukocytosis with concern for hemoconcentration, acute on chronic renal failure with a creatinine up to 3.08, severe hypokalemia and per patient active ongoing GI losses symptoms.  She has failed to respond to oral antiemetics (Compazine and Zofran) and antidiarrheal treatment (Lomotil/Imodium) as an outpatient.  TR[facility] has been contacted to place in the hospital for further evaluation and management.  Assessment and Plan: * Acute on chronic renal failure ([place]) -Patient with a stage IIIb at baseline; presenting with worsening renal function and acute kidney injury in the setting of prerenal azotemia and dehydration. -baseline creatinine 1.1-1.4 -Serum creatinine peaked 3.08 -Urinalysis without suspicious for UTI; but patient reporting some discomfort while urinating.  Urine will be checked for cultures. -Renal ultrasound demonstrating no obstructive uropathy and normal degree of bladder distention. -improving with IVF>>serum creatinine 1.21 at time of dc   UTI (urinary tract  infection) UA 21-50WBC Urine culture--E coli -started cefazolin>>d/c home with 3 more days cephalexin  Anemia due to chronic kidney disease -No overt bleeding appreciated -Use of chemotherapy most likely contributing to patient anemia -Hemoglobin 8.8; after receiving 1 unit PRBC transfusion on 01/03/2022. -Follow response/stability and hemoglobin trend.  Thrombocytopenia ([place]) -Avoid the use of heparin products -SCDs for DVT prophylaxis. -No overt bleeding appreciated; follow platelet count trend. -Platelet count 131 K. -No signs of overt bleeding.  Leukocytosis -Appears to be secondary to stress demargination and hemoconcentration from dehydration -WBCs within normal limits after fluid resuscitation initiated. -Continue to follow trend.  Metabolic acidosis -In the setting of dehydration from diarrhea and acute on chronic renal failure -NAG metabolic acidosis due to diarrhea -added sodium bicarbonate infusion; and follow trend.  While taking serum bicarbonate by mouth patient be came really sick and started to vomiting. -bicarb improved>>d/c bicarb drip  Hypokalemia -In the setting of GI losses and decreased oral intake -Safe to discontinue telemetry at this point.  Continue to follow electrolytes trend intermittently.  Replete as needed   Nausea vomiting and diarrhea -Appears to be secondary to side effects from chemotherapy>>overall improved at time of d/c -stool pathogen panel--neg -Patient is afebrile and reports no abdominal pain, dysuria, hematuria or any other complaints to suggest active infection. -After fluid resuscitation WBCs within normal range.  Patient denies abdominal pain. -5/16 CT abd--fluid in colon and rectum, stranding in perirectal fat plane with patchy density between sacrum and rectum -general surgery consult--discussed with Dr. Turner Daniels was present on PET in 10/2021--continue observation and surveillance scans as outpt  Dehydration -In  the setting of decreased ability to maintain adequate oral hydration due to uncontrolled nausea/vomiting and diarrhea. -Continue fluid resuscitation, as needed  antiemetics, the use of Lomotil and as needed Imodium. -improved at time of d/c  Breast cancer, left [facility]) - Patient has completed 3 cycles of chemotherapy so far. -Continue outpatient follow-up with oncology service. -Stage IIa-III, locally advanced oligometastatic left breast cancer (cT3, CN I, cM1, G2, ER positive, PR positive, HER 2+).  Essential hypertension -Stable overall -initially held amlodipine and metoprolol due to soft BPs -restart metoprolol -hold amlodipine and monitor BPs at home  CAD S/P percutaneous coronary angioplasty -No chest pain or shortness of breath -Continue with the use of aspirin. -Due to thrombocytopenia Brilinta has been placed on hold>>restart since platelets improved to 222 on day of dc -restart metoprolol with improved BP  Hyperlipidemia LDL goal <70 -Continue the use of Repatha as an outpatient.  Smoker -Cessation counseling provided -Nicotine patch ordered.         Consultants: general surgery Procedures performed: none  Disposition: Home Diet recommendation:  Cardiac diet DISCHARGE MEDICATION: Allergies as of 01/07/2022   No Known Allergies      Medication List     STOP taking these medications    amLODipine 10 MG tablet Commonly known as: NORVASC       TAKE these medications    ALPRAZolam 0.25 MG tablet Commonly known as: XANAX Take 1 tablet (0.25 mg total) by mouth 2 (two) times daily as needed for anxiety.   aspirin EC 81 MG tablet Take 1 tablet (81 mg total) by mouth daily.   CARBOPLATIN IV Inject into the vein every 21 ( twenty-one) days.   cephALEXin 500 MG capsule Commonly known as: KEFLEX Take 1 capsule (500 mg total) by mouth every 8 (eight) hours.   diphenoxylate-atropine 2.5-0.025 MG tablet Commonly known as: LOMOTIL Take 2 tablets by mouth  4 (four) times daily as needed for diarrhea or loose stools.   gabapentin 300 MG capsule Commonly known as: NEURONTIN Take 300 mg by mouth 2 (two) times daily.   Kanjinti 150 MG Solr injection Generic drug: trastuzumab-anns Inject 462 mg into the vein every 21 ( twenty-one) days.   levocetirizine 5 MG tablet Commonly known as: XYZAL Take 5 mg by mouth daily.   lidocaine-prilocaine cream Commonly known as: EMLA Apply a small amount to port a cath site (do not rub in) and cover with plastic wrap 1 hour prior to chemotherapy appointments   magnesium oxide 400 (240 Mg) MG tablet Commonly known as: MAG-OX Take 1 tablet (400 mg total) by mouth in the morning, at noon, and at bedtime.   metoprolol tartrate 25 MG tablet Commonly known as: LOPRESSOR Take 1 tablet (25 mg total) by mouth 2 (two) times daily. Please call and schedule overdue appointment with Dr. Gwenlyn Found for further refills . SECOND ATTEMPT! What changed:  when to take this reasons to take this   omeprazole 20 MG capsule Commonly known as: PRILOSEC Take 20 mg by mouth daily.   ondansetron 4 MG tablet Commonly known as: Zofran Take 1 tablet (4 mg total) by mouth every 8 (eight) hours as needed. What changed: reasons to take this   PERJETA IV Inject into the vein every 21 ( twenty-one) days.   prochlorperazine 10 MG tablet Commonly known as: COMPAZINE Take 1 tablet (10 mg total) by mouth every 6 (six) hours as needed (Nausea or vomiting).   Repatha SureClick 967 MG/ML Soaj Generic drug: Evolocumab Inject 140 mg into the skin every 14 (fourteen) days.   TAXOTERE IV Inject into the vein every 21 ( twenty-one) days.  ticagrelor 90 MG Tabs tablet Commonly known as: BRILINTA Take 1 tablet (90 mg total) by mouth 2 (two) times daily. What changed: when to take this   Ziextenzo 6 MG/0.6ML injection Generic drug: pegfilgrastim-bmez Inject 6 mg into the skin every 21 ( twenty-one) days. AFTER chemotherapy.         Discharge Exam: Filed Weights   01/01/22 1633 01/01/22 1657  Weight: 67.6 kg 67.6 kg   HEENT:  [place]/AT, No thrush, no icterus CV:  RRR, no rub, no S3, no S4 Lung:  CTA, no wheeze, no rhonchi Abd:  soft/+BS, NT Ext:  No edema, no lymphangitis, no synovitis, no rash   Condition at discharge: stable  The results of significant diagnostics from this hospitalization (including imaging, microbiology, ancillary and laboratory) are listed below for reference.   Imaging Studies: CT ABDOMEN PELVIS WO CONTRAST  Result Date: 01/05/2022 CLINICAL DATA:  Abdominal pain, nausea, vomiting, diarrhea x3 weeks EXAM: CT ABDOMEN AND PELVIS WITHOUT CONTRAST TECHNIQUE: Multidetector CT imaging of the abdomen and pelvis was performed following the standard protocol without IV contrast. RADIATION DOSE REDUCTION: This exam was performed according to the departmental dose-optimization program which includes automated exposure control, adjustment of the mA and/or kV according to patient size and/or use of iterative reconstruction technique. COMPARISON:  PET-CT done on 10/22/2021 FINDINGS: Lower chest: Minimal left pleural effusion is seen. There are scattered coronary artery calcifications. There is skin thickening in the visualized lower portion of left breast. Hepatobiliary: Liver measures 19.1 cm. Evaluation is limited without contrast enhancement. There is no dilation of bile ducts. Gallbladder is unremarkable. Pancreas: There is atrophy.  No focal abnormality is seen. Spleen: Unremarkable. Adrenals/Urinary Tract: Adrenals are unremarkable. There is no hydronephrosis. There are no renal or ureteral stones. Urinary bladder is almost completely empty limiting evaluation. Stomach/Bowel: Stomach is unremarkable. Small bowel loops are not dilated. Appendix is not dilated. There is no significant wall thickening in colon. Liquid stool is seen in the lumen of left colon. There is no pericolic stranding. There is stranding in  the fat planes adjacent to the posterior aspect of rectum. There is 3.7 x 2 cm subtle inhomogeneous increased density in the presacral region posterior to the rectum. No loculated fluid collections are seen in the pelvic cavity. Vascular/Lymphatic: Extensive arterial calcifications are seen. There is 3.3 cm infrarenal aortic aneurysm. No significant lymphadenopathy in the mesentery. There are few subcentimeter nodes in the posterior mediastinum behind the lower thoracic esophagus. Reproductive: Uterus is not seen. Other: There is no ascites or pneumoperitoneum. Umbilical hernia containing fat is seen. Musculoskeletal: No focal lytic or sclerotic lesions are seen in bony structures. IMPRESSION: There is fluid in the lumen of colon and rectum suggesting possible nonspecific enterocolitis. There is no significant wall thickening in colon. There is no evidence of intestinal obstruction or pneumoperitoneum. There is no hydronephrosis. Appendix is not dilated. There is stranding in the perirectal fat plane, especially posteriorly. There is patchy 3.7 cm density between sacrum and rectum which may be suggestive of chronic inflammatory process. There is no loculated perirectal fluid collection. 3.3 cm infrarenal aortic aneurysm is seen. Follow-up CT every 3 years may be considered. Minimal left pleural effusion. There are scattered coronary artery calcifications. Enlarged liver. Other findings as described in the body of the report. Electronically Signed   By: Elmer Picker M.D.   On: 01/05/2022 19:06   US RENAL  Result Date: 01/02/2022 CLINICAL DATA:  Acute renal insufficiency.  Breast cancer. EXAM: RENAL / URINARY  TRACT ULTRASOUND COMPLETE COMPARISON:  PET-CT 10/22/2021 FINDINGS: Right Kidney: Renal measurements: 9.2 x 3.8 x 4.8 cm = volume: 89.1 mL. Normal renal cortical thickness and echogenicity. No focal lesions or hydronephrosis. Left Kidney: Renal measurements: 8.7 x 4.5 x 4.3 cm = volume: 87.9 mL. Normal  renal cortical thickness and echogenicity without focal lesions or hydronephrosis. Bladder: Appears normal for degree of bladder distention. Other: None. IMPRESSION: Normal renal ultrasound examination. Electronically Signed   By: Marijo Sanes M.D.   On: 01/02/2022 12:40    Microbiology: Results for orders placed or performed during the hospital encounter of 01/01/22  Urine Culture     Status: Abnormal   Collection Time: 01/02/22  5:30 AM   Specimen: Urine, Clean Catch  Result Value Ref Range Status   Specimen Description   Final    URINE, CLEAN CATCH Performed at [facility], [address], [place], [place] [address]    Special Requests   Final    NONE Performed at [facility], [address] [place], [place] [address]    Culture >=100,000 COLONIES/mL ESCHERICHIA COLI (A)  Final   Report Status 01/06/2022 FINAL  Final   Organism ID, Bacteria ESCHERICHIA COLI (A)  Final      Susceptibility   Escherichia coli - MIC*    AMPICILLIN >=32 RESISTANT Resistant     CEFAZOLIN <=4 SENSITIVE Sensitive     CEFEPIME <=0.12 SENSITIVE Sensitive     CEFTRIAXONE <=0.25 SENSITIVE Sensitive     CIPROFLOXACIN <=0.25 SENSITIVE Sensitive     GENTAMICIN <=1 SENSITIVE Sensitive     IMIPENEM <=0.25 SENSITIVE Sensitive     NITROFURANTOIN <=16 SENSITIVE Sensitive     TRIMETH/SULFA <=20 SENSITIVE Sensitive     AMPICILLIN/SULBACTAM >=32 RESISTANT Resistant     PIP/TAZO <=4 SENSITIVE Sensitive     * >=100,000 COLONIES/mL ESCHERICHIA COLI  Gastrointestinal Panel by PCR , Stool     Status: None   Collection Time: 01/06/22  5:00 PM   Specimen: Stool  Result Value Ref Range Status   Campylobacter species NOT DETECTED NOT DETECTED Final   Plesimonas shigelloides NOT DETECTED NOT DETECTED Final   Salmonella species NOT DETECTED NOT DETECTED Final   Yersinia enterocolitica NOT DETECTED NOT DETECTED Final   Vibrio species NOT DETECTED NOT DETECTED Final   Vibrio cholerae NOT DETECTED NOT DETECTED Final    Enteroaggregative E coli (EAEC) NOT DETECTED NOT DETECTED Final   Enteropathogenic E coli (EPEC) NOT DETECTED NOT DETECTED Final   Enterotoxigenic E coli (ETEC) NOT DETECTED NOT DETECTED Final   Shiga like toxin producing E coli (STEC) NOT DETECTED NOT DETECTED Final   Shigella/Enteroinvasive E coli (EIEC) NOT DETECTED NOT DETECTED Final   Cryptosporidium NOT DETECTED NOT DETECTED Final   Cyclospora cayetanensis NOT DETECTED NOT DETECTED Final   Entamoeba histolytica NOT DETECTED NOT DETECTED Final   Giardia lamblia NOT DETECTED NOT DETECTED Final   Adenovirus F40/41 NOT DETECTED NOT DETECTED Final   Astrovirus NOT DETECTED NOT DETECTED Final   Norovirus GI/GII NOT DETECTED NOT DETECTED Final   Rotavirus A NOT DETECTED NOT DETECTED Final   Sapovirus (I, II, IV, and V) NOT DETECTED NOT DETECTED Final    Comment: Performed at [facility], [address] [place], [place] [address]    Labs: CBC: Recent Labs  Lab 01/02/22 0511 01/02/22 0731 01/03/22 0500 01/04/22 0540 01/07/22 0535  WBC 9.4 9.2 8.9 10.9* 7.2  HGB 7.2* 7.1* 6.8* 8.8* 7.8*  HCT 22.1* 22.1* 22.1* 28.9* 25.4*  MCV 102.3* 102.8* 105.7* 103.2* 102.4*  PLT 70* 74* 92* 131* 948   Basic Metabolic Panel: Recent Labs  Lab 01/01/22 0921 01/01/22 1621 01/02/22 0511 01/03/22 0629 01/04/22 0540 01/05/22 0535 01/06/22 0616 01/07/22 0535  NA 136  --    < > 141 141 142 140 141  K 2.0*  --    < > 3.4* 3.8 4.4 3.7 3.9  CL 104  --    < > 120* 123* 122* 114* 112*  CO2 20*  --    < > 16* 15* 17* 23 23  GLUCOSE 159*  --    < > 113* 106* 94 107* 89  BUN 46*  --    < > 28* '18 12 8 '$ 7*  CREATININE 3.08*  --    < > 1.97* 1.63* 1.30* 1.21* 1.21*  CALCIUM 9.9  --    < > 9.1 9.0 8.8* 8.2* 7.7*  MG 1.9  --   --   --   --   --   --   --   PHOS  --  3.1  --   --   --   --   --   --    < > = values in this interval not displayed.   Liver Function Tests: Recent Labs  Lab 01/01/22 0921 01/02/22 0511  AST 18 12*  ALT 20  15  ALKPHOS 246* 170*  BILITOT 0.8 0.6  PROT 7.5 5.8*  ALBUMIN 4.1 3.2*   CBG: No results for input(s): GLUCAP in the last 168 hours.  Discharge time spent: greater than 30 minutes.  Signed: Orson Eva, MD [facility] 01/07/2022

## 2022-01-07 NOTE — Progress Notes (Signed)
Nsg Discharge Note  Admit Date:  01/01/2022 Discharge date: 01/07/2022   Traci Fuller to be D/C'd Home per MD order.  AVS completed.  Copy for chart, and copy for patient signed, and dated. Patient/caregiver able to verbalize understanding.  Discharge Medication: Allergies as of 01/07/2022   No Known Allergies      Medication List     STOP taking these medications    amLODipine 10 MG tablet Commonly known as: NORVASC       TAKE these medications    ALPRAZolam 0.25 MG tablet Commonly known as: XANAX Take 1 tablet (0.25 mg total) by mouth 2 (two) times daily as needed for anxiety.   aspirin EC 81 MG tablet Take 1 tablet (81 mg total) by mouth daily.   CARBOPLATIN IV Inject into the vein every 21 ( twenty-one) days.   cephALEXin 500 MG capsule Commonly known as: KEFLEX Take 1 capsule (500 mg total) by mouth every 8 (eight) hours.   diphenoxylate-atropine 2.5-0.025 MG tablet Commonly known as: LOMOTIL Take 2 tablets by mouth 4 (four) times daily as needed for diarrhea or loose stools.   gabapentin 300 MG capsule Commonly known as: NEURONTIN Take 300 mg by mouth 2 (two) times daily.   Kanjinti 150 MG Solr injection Generic drug: trastuzumab-anns Inject 462 mg into the vein every 21 ( twenty-one) days.   levocetirizine 5 MG tablet Commonly known as: XYZAL Take 5 mg by mouth daily.   lidocaine-prilocaine cream Commonly known as: EMLA Apply a small amount to port a cath site (do not rub in) and cover with plastic wrap 1 hour prior to chemotherapy appointments   magnesium oxide 400 (240 Mg) MG tablet Commonly known as: MAG-OX Take 1 tablet (400 mg total) by mouth in the morning, at noon, and at bedtime.   metoprolol tartrate 25 MG tablet Commonly known as: LOPRESSOR Take 1 tablet (25 mg total) by mouth 2 (two) times daily. Please call and schedule overdue appointment with Dr. Gwenlyn Found for further refills . SECOND ATTEMPT! What changed:  when to take  this reasons to take this   omeprazole 20 MG capsule Commonly known as: PRILOSEC Take 20 mg by mouth daily.   ondansetron 4 MG tablet Commonly known as: Zofran Take 1 tablet (4 mg total) by mouth every 8 (eight) hours as needed. What changed: reasons to take this   PERJETA IV Inject into the vein every 21 ( twenty-one) days.   prochlorperazine 10 MG tablet Commonly known as: COMPAZINE Take 1 tablet (10 mg total) by mouth every 6 (six) hours as needed (Nausea or vomiting).   Repatha SureClick 470 MG/ML Soaj Generic drug: Evolocumab Inject 140 mg into the skin every 14 (fourteen) days.   TAXOTERE IV Inject into the vein every 21 ( twenty-one) days.   ticagrelor 90 MG Tabs tablet Commonly known as: BRILINTA Take 1 tablet (90 mg total) by mouth 2 (two) times daily. What changed: when to take this   Ziextenzo 6 MG/0.6ML injection Generic drug: pegfilgrastim-bmez Inject 6 mg into the skin every 21 ( twenty-one) days. AFTER chemotherapy.        Discharge Assessment: Vitals:   01/07/22 0945 01/07/22 1400  BP: 132/72 (!) 141/87  Pulse: 77 78  Resp:  20  Temp:  98.5 F (36.9 C)  SpO2:  98%   Skin clean, dry and intact without evidence of skin break down, no evidence of skin tears noted. IV catheter discontinued intact. Site without signs and symptoms of complications -  no redness or edema noted at insertion site, patient denies c/o pain - only slight tenderness at site.  Dressing with slight pressure applied.  D/c Instructions-Education: Discharge instructions given to patient/family with verbalized understanding. D/c education completed with patient/family including follow up instructions, medication list, d/c activities limitations if indicated, with other d/c instructions as indicated by MD - patient able to verbalize understanding, all questions fully answered. Patient instructed to return to ED, call 911, or call MD for any changes in condition.  Patient escorted via  [place], and D/C home via private auto.  Loa Socks, RN 01/07/2022 5:12 PM

## 2022-01-11 ENCOUNTER — Telehealth (HOSPITAL_COMMUNITY): Payer: Self-pay | Admitting: Dietician

## 2022-01-11 ENCOUNTER — Inpatient Hospital Stay (HOSPITAL_COMMUNITY): Payer: Managed Care, Other (non HMO) | Admitting: Dietician

## 2022-01-11 NOTE — Telephone Encounter (Signed)
Nutrition  Patient identified on MST (malnutrition screening tool report).  66 year old female with stage II breast cancer of left breast, estrogen receptor positive. She is receiving TCHP q21d. Patient completed C3 on 12/16/21.  -noted hospital admission 5/12-5/18 with acute on chronic renal failure, dehydration  Spoke with patient via telephone. Introduced self and services available to [facility]. Patient appreciative of call and agreeable to visit. She reports improving appetite and feeling better since hospital discharge. She recalls hibachi steak, shrimp, rice, vegetables for supper last night. Today patient has eaten cup of yogurt and oodles and noodles for lunch. She is drinking boxed juice drinks. Patient reports water "taste nasty" since starting treatment. She has not tried altering flavor of water with fruit or flavor drops. Patient is not drinking oral supplements. She has tried vanilla Boost and did not like this. Patient denies nausea, vomiting, diarrhea, constipation.   RD educated on small frequent meals and snacks with adequate calories and protein, offered suggestions and will mail handout with ideas Discussed trying different brands/flavors of oral supplements - will leave samples and coupons for pt to pickup at 6/7 office visit (pt aware) Educated on strategies for nausea, vomiting, diarrhea - will mail handout with tips Will mail contact information

## 2022-01-12 ENCOUNTER — Encounter (HOSPITAL_COMMUNITY): Payer: Self-pay | Admitting: *Deleted

## 2022-01-12 NOTE — Progress Notes (Signed)
Traci Fuller was contacted by telephone to verify understanding of discharge instructions status post their most recent discharge from the hospital on the date:  01/07/22. Advised of upcoming [place].  Verification of understanding for oncology specific follow-up was validated using the Teach Back method.    Transportation to appointments were confirmed for the patient as being self/caregiver.  [name]'s questions were addressed to their satisfaction upon completion of this post discharge follow-up call for outpatient oncology.

## 2022-01-20 ENCOUNTER — Inpatient Hospital Stay (HOSPITAL_COMMUNITY): Payer: Managed Care, Other (non HMO) | Admitting: Hematology

## 2022-01-20 ENCOUNTER — Inpatient Hospital Stay (HOSPITAL_COMMUNITY): Payer: Managed Care, Other (non HMO)

## 2022-01-20 VITALS — BP 134/89 | HR 72 | Temp 97.9°F | Resp 18

## 2022-01-20 DIAGNOSIS — F419 Anxiety disorder, unspecified: Secondary | ICD-10-CM | POA: Diagnosis not present

## 2022-01-20 DIAGNOSIS — C50919 Malignant neoplasm of unspecified site of unspecified female breast: Secondary | ICD-10-CM

## 2022-01-20 DIAGNOSIS — I10 Essential (primary) hypertension: Secondary | ICD-10-CM | POA: Diagnosis not present

## 2022-01-20 DIAGNOSIS — Z170 Estrogen receptor positive status [ER+]: Secondary | ICD-10-CM | POA: Diagnosis not present

## 2022-01-20 DIAGNOSIS — Z5111 Encounter for antineoplastic chemotherapy: Secondary | ICD-10-CM | POA: Diagnosis present

## 2022-01-20 DIAGNOSIS — C50812 Malignant neoplasm of overlapping sites of left female breast: Secondary | ICD-10-CM

## 2022-01-20 DIAGNOSIS — Z95828 Presence of other vascular implants and grafts: Secondary | ICD-10-CM

## 2022-01-20 DIAGNOSIS — E876 Hypokalemia: Secondary | ICD-10-CM

## 2022-01-20 DIAGNOSIS — Z79899 Other long term (current) drug therapy: Secondary | ICD-10-CM | POA: Diagnosis not present

## 2022-01-20 LAB — CBC WITH DIFFERENTIAL/PLATELET
Abs Immature Granulocytes: 0.06 10*3/uL (ref 0.00–0.07)
Basophils Absolute: 0.1 10*3/uL (ref 0.0–0.1)
Basophils Relative: 1 %
Eosinophils Absolute: 0.2 10*3/uL (ref 0.0–0.5)
Eosinophils Relative: 3 %
HCT: 30.3 % — ABNORMAL LOW (ref 36.0–46.0)
Hemoglobin: 9.3 g/dL — ABNORMAL LOW (ref 12.0–15.0)
Immature Granulocytes: 1 %
Lymphocytes Relative: 16 %
Lymphs Abs: 1 10*3/uL (ref 0.7–4.0)
MCH: 31.8 pg (ref 26.0–34.0)
MCHC: 30.7 g/dL (ref 30.0–36.0)
MCV: 103.8 fL — ABNORMAL HIGH (ref 80.0–100.0)
Monocytes Absolute: 0.5 10*3/uL (ref 0.1–1.0)
Monocytes Relative: 8 %
Neutro Abs: 4.6 10*3/uL (ref 1.7–7.7)
Neutrophils Relative %: 71 %
Platelets: 272 10*3/uL (ref 150–400)
RBC: 2.92 MIL/uL — ABNORMAL LOW (ref 3.87–5.11)
RDW: 19.4 % — ABNORMAL HIGH (ref 11.5–15.5)
WBC: 6.4 10*3/uL (ref 4.0–10.5)
nRBC: 0 % (ref 0.0–0.2)

## 2022-01-20 LAB — COMPREHENSIVE METABOLIC PANEL
ALT: 8 U/L (ref 0–44)
AST: 14 U/L — ABNORMAL LOW (ref 15–41)
Albumin: 2.9 g/dL — ABNORMAL LOW (ref 3.5–5.0)
Alkaline Phosphatase: 120 U/L (ref 38–126)
Anion gap: 3 — ABNORMAL LOW (ref 5–15)
BUN: 5 mg/dL — ABNORMAL LOW (ref 8–23)
CO2: 26 mmol/L (ref 22–32)
Calcium: 8 mg/dL — ABNORMAL LOW (ref 8.9–10.3)
Chloride: 112 mmol/L — ABNORMAL HIGH (ref 98–111)
Creatinine, Ser: 0.95 mg/dL (ref 0.44–1.00)
GFR, Estimated: >60 mL/min (ref >60)
Glucose, Bld: 117 mg/dL — ABNORMAL HIGH (ref 70–99)
Potassium: 3.3 mmol/L — ABNORMAL LOW (ref 3.5–5.1)
Sodium: 141 mmol/L (ref 135–145)
Total Bilirubin: 0.4 mg/dL (ref 0.3–1.2)
Total Protein: 6.1 g/dL — ABNORMAL LOW (ref 6.5–8.1)

## 2022-01-20 LAB — MAGNESIUM: Magnesium: 1 mg/dL — ABNORMAL LOW (ref 1.7–2.4)

## 2022-01-20 MED ORDER — SODIUM CHLORIDE 0.9 % IV SOLN
60.0000 mg/m2 | Freq: Once | INTRAVENOUS | Status: AC
  Administered 2022-01-20: 100 mg via INTRAVENOUS
  Filled 2022-01-20: qty 10

## 2022-01-20 MED ORDER — SODIUM CHLORIDE 0.9 % IV SOLN: Freq: Once | INTRAVENOUS | Status: AC

## 2022-01-20 MED ORDER — MAGNESIUM SULFATE 2 GM/50ML IV SOLN
2.0000 g | INTRAVENOUS | Status: AC
  Administered 2022-01-20 (×2): 2 g via INTRAVENOUS
  Filled 2022-01-20 (×2): qty 50

## 2022-01-20 MED ORDER — PALONOSETRON HCL INJECTION 0.25 MG/5ML
0.2500 mg | Freq: Once | INTRAVENOUS | Status: AC
  Administered 2022-01-20: 0.25 mg via INTRAVENOUS
  Filled 2022-01-20: qty 5

## 2022-01-20 MED ORDER — AMLODIPINE BESYLATE 5 MG PO TABS
10.0000 mg | ORAL_TABLET | Freq: Once | ORAL | Status: AC
  Administered 2022-01-20: 10 mg via ORAL
  Filled 2022-01-20: qty 2

## 2022-01-20 MED ORDER — SODIUM CHLORIDE 0.9 % IV SOLN
10.0000 mg | Freq: Once | INTRAVENOUS | Status: AC
  Administered 2022-01-20: 10 mg via INTRAVENOUS
  Filled 2022-01-20: qty 10

## 2022-01-20 MED ORDER — HEPARIN SOD (PORK) LOCK FLUSH 100 UNIT/ML IV SOLN
500.0000 [IU] | Freq: Once | INTRAVENOUS | Status: AC | PRN
  Administered 2022-01-20: 500 [IU]

## 2022-01-20 MED ORDER — TRASTUZUMAB-ANNS CHEMO 420 MG IV SOLR
6.0000 mg/kg | Freq: Once | INTRAVENOUS | Status: AC
  Administered 2022-01-20: 399 mg via INTRAVENOUS
  Filled 2022-01-20: qty 19

## 2022-01-20 MED ORDER — SODIUM CHLORIDE 0.9 % IV SOLN
400.0000 mg | Freq: Once | INTRAVENOUS | Status: AC
  Administered 2022-01-20: 400 mg via INTRAVENOUS
  Filled 2022-01-20: qty 40

## 2022-01-20 MED ORDER — SODIUM CHLORIDE 0.9% FLUSH
10.0000 mL | INTRAVENOUS | Status: DC | PRN
  Administered 2022-01-20: 10 mL

## 2022-01-20 MED ORDER — ACETAMINOPHEN 325 MG PO TABS
650.0000 mg | ORAL_TABLET | Freq: Once | ORAL | Status: AC
  Administered 2022-01-20: 650 mg via ORAL
  Filled 2022-01-20: qty 2

## 2022-01-20 MED ORDER — SODIUM CHLORIDE 0.9 % IV SOLN
150.0000 mg | Freq: Once | INTRAVENOUS | Status: AC
  Administered 2022-01-20: 150 mg via INTRAVENOUS
  Filled 2022-01-20: qty 150

## 2022-01-20 MED ORDER — DIPHENHYDRAMINE HCL 25 MG PO CAPS
50.0000 mg | ORAL_CAPSULE | Freq: Once | ORAL | Status: AC
  Administered 2022-01-20: 50 mg via ORAL
  Filled 2022-01-20: qty 2

## 2022-01-20 MED ORDER — SODIUM CHLORIDE 0.9 % IV SOLN
420.0000 mg | Freq: Once | INTRAVENOUS | Status: AC
  Administered 2022-01-20: 420 mg via INTRAVENOUS
  Filled 2022-01-20: qty 14

## 2022-01-20 MED ORDER — POTASSIUM CHLORIDE CRYS ER 20 MEQ PO TBCR
20.0000 meq | EXTENDED_RELEASE_TABLET | Freq: Once | ORAL | Status: AC
  Administered 2022-01-20: 20 meq via ORAL
  Filled 2022-01-20: qty 1

## 2022-01-20 NOTE — Patient Instructions (Signed)
Traci Fuller  Discharge Instructions: Thank you for choosing [facility] to provide your oncology and hematology care.  If you have a lab appointment with the [facility], please come in thru the Main Entrance and check in at the main information desk.  Wear comfortable clothing and clothing appropriate for easy access to any Portacath or PICC line.   We strive to give you quality time with your provider. You may need to reschedule your appointment if you arrive late (15 or more minutes).  Arriving late affects you and other patients whose appointments are after yours.  Also, if you miss three or more appointments without notifying the office, you may be dismissed from the clinic at the provider's discretion.      For prescription refill requests, have your pharmacy contact our office and allow 72 hours for refills to be completed.    Today you received the following chemotherapy and/or immunotherapy agents TCHP   To help prevent nausea and vomiting after your treatment, we encourage you to take your nausea medication as directed.  BELOW ARE SYMPTOMS THAT SHOULD BE REPORTED IMMEDIATELY: *FEVER GREATER THAN 100.4 F (38 C) OR HIGHER *CHILLS OR SWEATING *NAUSEA AND VOMITING THAT IS NOT CONTROLLED WITH YOUR NAUSEA MEDICATION *UNUSUAL SHORTNESS OF BREATH *UNUSUAL BRUISING OR BLEEDING *URINARY PROBLEMS (pain or burning when urinating, or frequent urination) *BOWEL PROBLEMS (unusual diarrhea, constipation, pain near the anus) TENDERNESS IN MOUTH AND THROAT WITH OR WITHOUT PRESENCE OF ULCERS (sore throat, sores in mouth, or a toothache) UNUSUAL RASH, SWELLING OR PAIN  UNUSUAL VAGINAL DISCHARGE OR ITCHING   Items with * indicate a potential emergency and should be followed up as soon as possible or go to the Emergency Department if any problems should occur.  Please show the CHEMOTHERAPY ALERT CARD or IMMUNOTHERAPY ALERT CARD at check-in to the Emergency Department and  triage nurse.  Should you have questions after your visit or need to cancel or reschedule your appointment, please contact [facility] [phone]  and follow the prompts.  Office hours are 8:00 a.m. to 4:30 p.m. Monday - Friday. Please note that voicemails left after 4:00 p.m. may not be returned until the following business day.  We are closed weekends and major holidays. You have access to a nurse at all times for urgent questions. Please call the main number to the clinic [phone] and follow the prompts.  For any non-urgent questions, you may also contact your provider using MyChart. We now offer e-Visits for anyone 24 and older to request care online for non-urgent symptoms. For details visit [email or web address].   Also download the MyChart app! Go to the app store, search "MyChart", open the app, select [facility], and log in with your MyChart username and password.  Due to Covid, a mask is required upon entering the hospital/clinic. If you do not have a mask, one will be given to you upon arrival. For doctor visits, patients may have 1 support person aged 15 or older with them. For treatment visits, patients cannot have anyone with them due to current Covid guidelines and our immunocompromised population.   Ado-Trastuzumab Emtansine for injection What is this medication? ADO-TRASTUZUMAB EMTANSINE (ADD oh traz TOO zuh mab em TAN zine) is a monoclonal antibody combined with chemotherapy. It is used to treat breast cancer. This medicine may be used for other purposes; ask your health care provider or pharmacist if you have questions. COMMON BRAND NAME(S): Kadcyla What should I tell my  care team before I take this medication? They need to know if you have any of these conditions: heart disease heart failure infection (especially a virus infection such as chickenpox, cold sores, or herpes) liver disease lung or breathing disease, like asthma tingling of the fingers or  toes, or other nerve disorder an unusual or allergic reaction to ado-trastuzumab emtansine, other medications, foods, dyes, or preservatives pregnant or trying to get pregnant breast-feeding How should I use this medication? This medicine is for infusion into a vein. It is given by a health care professional in a hospital or clinic setting. Talk to your pediatrician regarding the use of this medicine in children. Special care may be needed. Overdosage: If you think you have taken too much of this medicine contact a poison control center or emergency room at once. NOTE: This medicine is only for you. Do not share this medicine with others. What if I miss a dose? It is important not to miss your dose. Call your doctor or health care professional if you are unable to keep an appointment. What may interact with this medication? This medicine may also interact with the following medications: atazanavir boceprevir clarithromycin delavirdine indinavir dalfopristin; quinupristin isoniazid, INH itraconazole ketoconazole nefazodone nelfinavir ritonavir telaprevir telithromycin tipranavir voriconazole This list may not describe all possible interactions. Give your health care provider a list of all the medicines, herbs, non-prescription drugs, or dietary supplements you use. Also tell them if you smoke, drink alcohol, or use illegal drugs. Some items may interact with your medicine. What should I watch for while using this medication? Visit your doctor for checks on your progress. This drug may make you feel generally unwell. This is not uncommon, as chemotherapy can affect healthy cells as well as cancer cells. Report any side effects. Continue your course of treatment even though you feel ill unless your doctor tells you to stop. You may need blood work done while you are taking this medicine. Call your doctor or health care professional for advice if you get a fever, chills or sore throat,  or other symptoms of a cold or flu. Do not treat yourself. This drug decreases your body's ability to fight infections. Try to avoid being around people who are sick. Be careful brushing and flossing your teeth or using a toothpick because you may get an infection or bleed more easily. If you have any dental work done, tell your dentist you are receiving this medicine. Avoid taking products that contain aspirin, acetaminophen, ibuprofen, naproxen, or ketoprofen unless instructed by your doctor. These medicines may hide a fever. Do not become pregnant while taking this medicine or for 7 months after stopping it, men with female partners should use contraception during treatment and for 4 months after the last dose. Women should inform their doctor if they wish to become pregnant or think they might be pregnant. There is a potential for serious side effects to an unborn child. Do not breast-feed an infant while taking this medicine or for 7 months after the last dose. Men who have a partner who is pregnant or who is capable of becoming pregnant should use a condom during sexual activity while taking this medicine and for 4 months after stopping it. Men should inform their doctors if they wish to father a child. This medicine may lower sperm counts. Talk to your health care professional or pharmacist for more information. What side effects may I notice from receiving this medication? Side effects that you should report to  your doctor or health care professional as soon as possible: allergic reactions like skin rash, itching or hives, swelling of the face, lips, or tongue breathing problems chest pain or palpitations fever or chills, sore throat general ill feeling or flu-like symptoms light-colored stools nausea, vomiting pain, tingling, numbness in the hands or feet signs and symptoms of bleeding such as bloody or black, tarry stools; red or dark-brown urine; spitting up blood or brown material that  looks like coffee grounds; red spots on the skin; unusual bruising or bleeding from the eye, gums, or nose swelling of the legs or ankles yellowing of the eyes or skin Side effects that usually do not require medical attention (report to your doctor or health care professional if they continue or are bothersome): changes in taste constipation dizziness headache joint pain muscle pain trouble sleeping unusually weak or tired This list may not describe all possible side effects. Call your doctor for medical advice about side effects. You may report side effects to FDA at 1-800-FDA-1088. Where should I keep my medication? This drug is given in a hospital or clinic and will not be stored at home. NOTE: This sheet is a summary. It may not cover all possible information. If you have questions about this medicine, talk to your doctor, pharmacist, or health care provider.  2023 [employer]/Gold Standard (2018-08-29 00:00:00)

## 2022-01-20 NOTE — Progress Notes (Signed)
[place], [place] [address]   CLINIC:  Medical Oncology/Hematology  PCP:  Celene Squibb, MD [name] [place] [address] [phone]   REASON FOR VISIT:  Follow-up for locally advanced left breast cancer  PRIOR THERAPY: none  NGS Results: not done  CURRENT THERAPY: Docetaxel + Carboplatin + Trastuzumab + Pertuzumab  (TCHP) q21d   BRIEF ONCOLOGIC HISTORY:  Oncology History  Breast cancer, left ([place])  10/15/2021 Initial Diagnosis   Breast cancer, left ([place])    11/04/2021 -  Chemotherapy   Patient is on Treatment Plan : BREAST  Docetaxel + Carboplatin + Trastuzumab + Pertuzumab  (TCHP) q21d        Genetic Testing   Negative genetic testing. No pathogenic variants identified on the [facility] Multi-Cancer+RNA panel. The report date is 11/02/2021.   The Multi-Cancer Panel + RNA offered by [facility] includes sequencing and/or deletion duplication testing of the following 84 genes: AIP, ALK, APC, ATM, AXIN2,BAP1,  BARD1, BLM, BMPR1A, BRCA1, BRCA2, BRIP1, CASR, CDC73, CDH1, CDK4, CDKN1B, CDKN1C, CDKN2A (p14ARF), CDKN2A (p16INK4a), CEBPA, CHEK2, CTNNA1, DICER1, DIS3L2, EGFR (c.2369C>T, p.Thr790Met variant only), EPCAM (Deletion/duplication testing only), FH, FLCN, GATA2, GPC3, GREM1 (Promoter region deletion/duplication testing only), HOXB13 (c.251G>A, p.Gly84Glu), HRAS, KIT, MAX, MEN1, MET, MITF (c.952G>A, p.Glu318Lys variant only), MLH1, MSH2, MSH3, MSH6, MUTYH, NBN, NF1, NF2, NTHL1, PALB2, PDGFRA, PHOX2B, PMS2, POLD1, POLE, POT1, PRKAR1A, PTCH1, PTEN, RAD50, RAD51C, RAD51D, RB1, RECQL4, RET, RUNX1, SDHAF2, SDHA (sequence changes only), SDHB, SDHC, SDHD, SMAD4, SMARCA4, SMARCB1, SMARCE1, STK11, SUFU, TERC, TERT, TMEM127, TP53, TSC1, TSC2, VHL, WRN and WT1.     CANCER STAGING:  Cancer Staging  Breast cancer, left ([place]) Staging form: Breast, AJCC 8th Edition - Clinical stage from 10/15/2021: Stage IIA (cT3, cN1, cM0, G2, ER+, PR+, HER2+) -  Unsigned   INTERVAL HISTORY:  Traci Fuller, a 66 y.o. female, returns for routine follow-up and consideration for next cycle of chemotherapy. Traci Fuller was last seen on 12/16/2021.  Due for cycle #4 of TCHP today.   Overall, she tells me she has been feeling pretty well. One to two days after last treatment she experienced severe diarrhea and vomiting for which she was admitted to the hospital on 05/12. on Her appetite is good, and she denies current diarrhea and vomiting. She reports her energy is below her baseline, but she is able to do her typical daily activities. She has lost 5 lbs since her last visit. She denies tingling/numbness. She has stopped amlodipine since her admission to the hospital on 05/12. She takes 1 Gabapentin at night. She reports swelling of the right arm.   Overall, she feels ready for next cycle of chemo today.   REVIEW OF SYSTEMS:  Review of Systems  Constitutional:  Positive for fatigue and unexpected weight change (-5 lbs). Negative for appetite change.  Respiratory:  Positive for shortness of breath.   Cardiovascular:  Positive for leg swelling (R arm).  Gastrointestinal:  Negative for diarrhea (resolved) and vomiting (resolved).  Neurological:  Positive for headaches. Negative for numbness.  All other systems reviewed and are negative.  PAST MEDICAL/SURGICAL HISTORY:  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 DES to midLAD, 75% stenosis of RCA not treated   Cancer [facility])    Current smoker    Essential hypertension    Family history of bladder cancer    Family history of breast cancer  Family history of leukemia    Family history of uterine cancer    Hyperlipidemia LDL goal <70    Myocardial infarction ([place])    PONV (postoperative nausea and vomiting)    Port-A-Cath in place 10/29/2021   Past Surgical History:  Procedure Laterality Date   ABDOMINAL HYSTERECTOMY     BREAST BIOPSY  Bilateral 10/16/2021   CORONARY STENT INTERVENTION N/A 01/10/2018   Procedure: CORONARY STENT INTERVENTION;  Surgeon: Jettie Booze, MD;  Location: [facility];  Service: Cardiovascular;  Laterality: N/A;   LEFT HEART CATH AND CORONARY ANGIOGRAPHY N/A 01/10/2018   Procedure: LEFT HEART CATH AND CORONARY ANGIOGRAPHY;  Surgeon: Jettie Booze, MD;  Location: [facility];  Service: Cardiovascular;  Laterality: N/A;   PORTACATH PLACEMENT Right 10/26/2021   Procedure: INSERTION PORT-A-CATH;  Surgeon: Virl Cagey, MD;  Location: [facility];  Service: General;  Laterality: Right;    SOCIAL HISTORY:  Social History   Socioeconomic History   Marital status: Married    Spouse name: Not on file   Number of children: Not on file   Years of education: Not on file   Highest education level: Not on file  Occupational History   Not on file  Tobacco Use   Smoking status: Every Day    Packs/day: 1.00    Years: 43.00    Pack years: 43.00    Types: Cigarettes    Start date: 1976   Smokeless tobacco: Never  Vaping Use   Vaping Use: Never used  Substance and Sexual Activity   Alcohol use: Not Currently   Drug use: Never   Sexual activity: Yes  Other Topics Concern   Not on file  Social History Narrative   Not on file   Social Determinants of Health   Financial Resource Strain: Not on file  Food Insecurity: Not on file  Transportation Needs: Not on file  Physical Activity: Not on file  Stress: Not on file  Social Connections: Not on file  Intimate Partner Violence: Not on file    FAMILY HISTORY:  Family History  Problem Relation Age of Onset   Bladder Cancer Mother 34   Cervical cancer Mother        dx 62s, hysterectomy   Hypertension Father    Cirrhosis Father    Leukemia Brother 27   Liver cancer Brother 36   Breast cancer Maternal Aunt        dx 53s   Cancer Maternal Uncle        unk type d. 42   Cancer Paternal Aunt        unk type   Breast  cancer Maternal Grandmother        dx 51s   Uterine cancer Maternal Grandmother     CURRENT MEDICATIONS:  Current Outpatient Medications  Medication Sig Dispense Refill   ALPRAZolam (XANAX) 0.25 MG tablet Take 1 tablet (0.25 mg total) by mouth 2 (two) times daily as needed for anxiety. 60 tablet 2   aspirin EC 81 MG EC tablet Take 1 tablet (81 mg total) by mouth daily. 90 tablet 3   CARBOPLATIN IV Inject into the vein every 21 ( twenty-one) days.     cephALEXin (KEFLEX) 500 MG capsule Take 1 capsule (500 mg total) by mouth every 8 (eight) hours. 10 capsule 0   diphenoxylate-atropine (LOMOTIL) 2.5-0.025 MG tablet Take 2 tablets by mouth 4 (four) times daily as needed for diarrhea or loose stools. 60 tablet 3  DOCEtaxel (TAXOTERE IV) Inject into the vein every 21 ( twenty-one) days.     Evolocumab (REPATHA SURECLICK) 034 MG/ML SOAJ Inject 140 mg into the skin every 14 (fourteen) days. 2 mL 11   gabapentin (NEURONTIN) 300 MG capsule Take 300 mg by mouth 2 (two) times daily.     levocetirizine (XYZAL) 5 MG tablet Take 5 mg by mouth daily.     lidocaine-prilocaine (EMLA) cream Apply a small amount to port a cath site (do not rub in) and cover with plastic wrap 1 hour prior to chemotherapy appointments 30 g 3   magnesium oxide (MAG-OX) 400 (240 Mg) MG tablet Take 1 tablet (400 mg total) by mouth in the morning, at noon, and at bedtime. 90 tablet 3   metoprolol tartrate (LOPRESSOR) 25 MG tablet Take 1 tablet (25 mg total) by mouth 2 (two) times daily. Please call and schedule overdue appointment with Dr. Gwenlyn Found for further refills . SECOND ATTEMPT! (Patient taking differently: Take 25 mg by mouth daily as needed (if blood pressure is 120/80 or higher). Please call and schedule overdue appointment with Dr. Gwenlyn Found for further refills . SECOND ATTEMPT!) 30 tablet 0   omeprazole (PRILOSEC) 20 MG capsule Take 20 mg by mouth daily.     ondansetron (ZOFRAN) 4 MG tablet Take 1 tablet (4 mg total) by mouth every  8 (eight) hours as needed. (Patient taking differently: Take 4 mg by mouth every 8 (eight) hours as needed for nausea or vomiting.) 30 tablet 1   pegfilgrastim-bmez (ZIEXTENZO) 6 MG/0.6ML injection Inject 6 mg into the skin every 21 ( twenty-one) days. AFTER chemotherapy.     Pertuzumab (PERJETA IV) Inject into the vein every 21 ( twenty-one) days.     prochlorperazine (COMPAZINE) 10 MG tablet Take 1 tablet (10 mg total) by mouth every 6 (six) hours as needed (Nausea or vomiting). 30 tablet 1   ticagrelor (BRILINTA) 90 MG TABS tablet Take 1 tablet (90 mg total) by mouth 2 (two) times daily. (Patient taking differently: Take 90 mg by mouth daily.) 180 tablet 3   trastuzumab-anns (KANJINTI) 150 MG SOLR injection Inject 462 mg into the vein every 21 ( twenty-one) days.     No current facility-administered medications for this visit.    ALLERGIES:  No Known Allergies  PHYSICAL EXAM:  Performance status (ECOG): 1 - Symptomatic but completely ambulatory  There were no vitals filed for this visit. Wt Readings from Last 3 Encounters:  01/01/22 149 lb 0.5 oz (67.6 kg)  12/16/21 163 lb 3.2 oz (74 kg)  11/25/21 165 lb 12.8 oz (75.2 kg)   Physical Exam Vitals reviewed.  Constitutional:      Appearance: Normal appearance.  Cardiovascular:     Rate and Rhythm: Normal rate and regular rhythm.     Pulses: Normal pulses.     Heart sounds: Normal heart sounds.  Pulmonary:     Effort: Pulmonary effort is normal.     Breath sounds: Normal breath sounds.  Chest:  Breasts:    Left: Mass (7 cm UOQ) present. No swelling, bleeding, inverted nipple, nipple discharge, skin change or tenderness.  Lymphadenopathy:     Upper Body:     Right upper body: No supraclavicular or axillary adenopathy.     Left upper body: Axillary adenopathy (1 lymph node palpable) present. No supraclavicular adenopathy.  Neurological:     General: No focal deficit present.     Mental Status: She is alert and oriented to  person, place, and time.  Psychiatric:        Mood and Affect: Mood normal.        Behavior: Behavior normal.    LABORATORY DATA:  I have reviewed the labs as listed.     Latest Ref Rng & Units 01/07/2022    5:35 AM 01/04/2022    5:40 AM 01/03/2022    5:00 AM  CBC  WBC 4.0 - 10.5 K/uL 7.2   10.9   8.9    Hemoglobin 12.0 - 15.0 g/dL 7.8   8.8   6.8    Hematocrit 36.0 - 46.0 % 25.4   28.9   22.1    Platelets 150 - 400 K/uL 222   131   92        Latest Ref Rng & Units 01/07/2022    5:35 AM 01/06/2022    6:16 AM 01/05/2022    5:35 AM  CMP  Glucose 70 - 99 mg/dL 89   107   94    BUN 8 - 23 mg/dL _0 Creatinine 0.44 - 1.00 mg/dL 1.21   1.21   1.30    Sodium 135 - 145 mmol/L 141   140   142    Potassium 3.5 - 5.1 mmol/L 3.9   3.7   4.4    Chloride 98 - 111 mmol/L 112   114   122    CO2 22 - 32 mmol/L _1 Calcium 8.9 - 10.3 mg/dL 7.7   8.2   8.8      DIAGNOSTIC IMAGING:  I have independently reviewed the scans and discussed with the patient. CT ABDOMEN PELVIS WO CONTRAST  Result Date: 01/05/2022 CLINICAL DATA:  Abdominal pain, nausea, vomiting, diarrhea x3 weeks EXAM: CT ABDOMEN AND PELVIS WITHOUT CONTRAST TECHNIQUE: Multidetector CT imaging of the abdomen and pelvis was performed following the standard protocol without IV contrast. RADIATION DOSE REDUCTION: This exam was performed according to the departmental dose-optimization program which includes automated exposure control, adjustment of the mA and/or kV according to patient size and/or use of iterative reconstruction technique. COMPARISON:  PET-CT done on 10/22/2021 FINDINGS: Lower chest: Minimal left pleural effusion is seen. There are scattered coronary artery calcifications. There is skin thickening in the visualized lower portion of left breast. Hepatobiliary: Liver measures 19.1 cm. Evaluation is limited without contrast enhancement. There is no dilation of bile ducts. Gallbladder is unremarkable. Pancreas:  There is atrophy.  No focal abnormality is seen. Spleen: Unremarkable. Adrenals/Urinary Tract: Adrenals are unremarkable. There is no hydronephrosis. There are no renal or ureteral stones. Urinary bladder is almost completely empty limiting evaluation. Stomach/Bowel: Stomach is unremarkable. Small bowel loops are not dilated. Appendix is not dilated. There is no significant wall thickening in colon. Liquid stool is seen in the lumen of left colon. There is no pericolic stranding. There is stranding in the fat planes adjacent to the posterior aspect of rectum. There is 3.7 x 2 cm subtle inhomogeneous increased density in the presacral region posterior to the rectum. No loculated fluid collections are seen in the pelvic cavity. Vascular/Lymphatic: Extensive arterial calcifications are seen. There is 3.3 cm infrarenal aortic aneurysm. No significant lymphadenopathy in the mesentery. There are few subcentimeter nodes in the posterior mediastinum behind the lower thoracic esophagus. Reproductive: Uterus is not seen. Other: There is no ascites or pneumoperitoneum. Umbilical hernia containing fat is seen. Musculoskeletal: No focal lytic or sclerotic lesions are seen  in bony structures. IMPRESSION: There is fluid in the lumen of colon and rectum suggesting possible nonspecific enterocolitis. There is no significant wall thickening in colon. There is no evidence of intestinal obstruction or pneumoperitoneum. There is no hydronephrosis. Appendix is not dilated. There is stranding in the perirectal fat plane, especially posteriorly. There is patchy 3.7 cm density between sacrum and rectum which may be suggestive of chronic inflammatory process. There is no loculated perirectal fluid collection. 3.3 cm infrarenal aortic aneurysm is seen. Follow-up CT every 3 years may be considered. Minimal left pleural effusion. There are scattered coronary artery calcifications. Enlarged liver. Other findings as described in the body of the  report. Electronically Signed   By: Elmer Picker M.D.   On: 01/05/2022 19:06   US RENAL  Result Date: 01/02/2022 CLINICAL DATA:  Acute renal insufficiency.  Breast cancer. EXAM: RENAL / URINARY TRACT ULTRASOUND COMPLETE COMPARISON:  PET-CT 10/22/2021 FINDINGS: Right Kidney: Renal measurements: 9.2 x 3.8 x 4.8 cm = volume: 89.1 mL. Normal renal cortical thickness and echogenicity. No focal lesions or hydronephrosis. Left Kidney: Renal measurements: 8.7 x 4.5 x 4.3 cm = volume: 87.9 mL. Normal renal cortical thickness and echogenicity without focal lesions or hydronephrosis. Bladder: Appears normal for degree of bladder distention. Other: None. IMPRESSION: Normal renal ultrasound examination. Electronically Signed   By: Marijo Sanes M.D.   On: 01/02/2022 12:40     ASSESSMENT:  Locally advanced HER2 positive left breast cancer: - Patient felt lump over the last 1 year. - She noticed lump has gotten bigger and deformed since September 2022.  She has itching over the skin of the breast. - Left breast 4:00 biopsy: Invasive mammary carcinoma, grade 2, ER 100%, PR 70%, Ki-67 25%, HER2 FISH positive - Left breast 2:00 biopsy: Invasive ductal carcinoma, grade 2, ER 100%, PR 70%, Ki-67 25%, HER2 negative - Left axillary lymph node biopsy: IDC, ER 100%, PR 50%, HER2 negative, E-cadherin positive - Bilateral breast MRI on 10/09/2021: Multiple enhancing masses with intervening non-mass enhancement involving most of the LOQ and part of the UOQ.  Abnormal enhancement spans approximately 12.4 x 6.4 x 7 cm.  Diffuse skin thickening/edema.  Multiple left axillary lymph nodes, largest node measuring approximately 2.4 cm in the lower axilla.  Indeterminate non-mass enhancement inferior to the biopsy site in the subareolar right breast measuring 1.5 x 0.9 x 0.8 cm. - Right breast biopsy UOQ on 10/01/2021: Fibroadenomatoid nodule with calcifications, fibrocystic changes including apocrine metaplasia with no  malignancy. -We have reviewed PET scan from 10/22/2021: Large left breast mass and numerous hypermetabolic left axillary and subpectoral lymph nodes.  2 hypermetabolic left upper lobe lung nodules measuring 7 mm with SUV 4.2 and 3.2 respectively.  No other hypermetabolic lung nodules.  No distant metastatic disease.  Nonspecific low-level subareolar activity in the right breast near the surgical clips and a single mildly hypermetabolic right axillary lymph node. - Echocardiogram shows EF 60 to 65%. - She has oligometastatic disease with 2 small lung nodules.  Biopsy is not feasible.  I have recommended TCHP regimen followed by restaging after 3 cycles. - Cycle 1 of TCHP on 11/04/2021.    Social/family history: - She lives at home with her husband and daughter.  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  Current active smoker, half pack per day since age 3. - Mother had cervical cancer and bladder cancer.  Maternal grandmother had ovarian cancer.  Brother died at age 77 from acute leukemia.  Another brother died of liver cancer.  Maternal half aunt had breast cancer.   PLAN:  Oligometastatic (two 7 mm lung lesions) HER2 positive left breast cancer: - She was hospitalized after cycle 3 from 01/01/2022 - 01/07/2022 with diarrhea, acute kidney injury, UTI and vomiting. - She has lost 5 pounds since last visit. - Overall energy levels have improved. - Reviewed her labs today which showed normal LFTs.  Creatinine was normal.  Minor electrolyte abnormalities.  CBC was grossly normal.  I have reviewed CT scan from recent hospitalization. - We will proceed with cycle 4 today.  We will dose reduce docetaxel to 60 mg per metered square and carboplatin to 400 mg per dose. - Physical examination today shows 7 cm mass in the left breast upper outer quadrant with slightly palpable left axillary lymphadenopathy.  Overall this has improved from pretreatment exam. - Plan to repeat CT scan of the chest  prior to next visit to evaluate right lung nodules. - She will be evaluated in the symptom management clinic in 10 days.  RTC 3 weeks for follow-up.  2.  Right facial shingles/postherpetic right hemifacial pain: - Continue gabapentin 300 mg 3 times daily.  3.  Hypomagnesemia: - She has stopped taking magnesium since her recent discharge from the hospital. - Magnesium today is 1.0.  She will receive 4 g of IV magnesium. - She will restart magnesium 3 times daily.  4.  Anxiety: - Continue Xanax 0.25 mg at bedtime daily.  5.  Hypertension: - Blood pressure today is 170/86.  She will restart Norvasc 10 mg daily.   Orders placed this encounter:  No orders of the defined types were placed in this encounter.    Derek Jack, MD [facility] [phone]   I, Thana Ates, am acting as a scribe for Dr. Derek Jack.  I, Derek Jack MD, have reviewed the above documentation for accuracy and completeness, and I agree with the above.

## 2022-01-20 NOTE — Patient Instructions (Signed)
Traci Fuller at [facility] Discharge Instructions   You were seen and examined today by Dr. Delton Coombes.  He reviewed your lab results. Your magnesium is severely low at 1.0. We will give IV magnesium in the clinic today. Resume taking magnesium pills at home as prescribed.   Resume taking amlodipine for high blood pressure.  We will proceed with your treatment today with dose reduction of the chemotherapy drugs.   We will have you see Rebekah, our PA, in 1 week for symptom management.  We will repeat a CT of your chest prior to next treatment.   Return as scheduled.    Thank you for choosing [facility] at [facility] to provide your oncology and hematology care.  To afford each patient quality time with our provider, please arrive at least 15 minutes before your scheduled appointment time.   If you have a lab appointment with the [facility] please come in thru the Main Entrance and check in at the main information desk.  You need to re-schedule your appointment should you arrive 10 or more minutes late.  We strive to give you quality time with our providers, and arriving late affects you and other patients whose appointments are after yours.  Also, if you no show three or more times for appointments you may be dismissed from the clinic at the providers discretion.     Again, thank you for choosing [facility].  Our hope is that these requests will decrease the amount of time that you wait before being seen by our physicians.       _____________________________________________________________  Should you have questions after your visit to [facility], please contact our office at [phone] and follow the prompts.  Our office hours are 8:00 a.m. and 4:30 p.m. Monday - Friday.  Please note that voicemails left after 4:00 p.m. may not be returned until the following business day.  We are closed weekends and major  holidays.  You do have access to a nurse 24-7, just call the main number to the clinic [phone] and do not press any options, hold on the line and a nurse will answer the phone.    For prescription refill requests, have your pharmacy contact our office and allow 72 hours.    Due to Covid, you will need to wear a mask upon entering the hospital. If you do not have a mask, a mask will be given to you at the Main Entrance upon arrival. For doctor visits, patients may have 1 support person age 6 or older with them. For treatment visits, patients can not have anyone with them due to social distancing guidelines and our immunocompromised population.

## 2022-01-20 NOTE — Progress Notes (Signed)
Pt presents today for Traci Fuller per provider's order. Vital signs and other labs WNL for treatment. Pt's BP was 170/86 today. Pt will receive Norvasc 10 mg p.o x 1 dose per Dr.K. Pt's magnesium was 1 today and potassium was 3.3. Dr.K stated to give 4g IV magnesium and potassium chloride 20 mEq p.o x 1 dose. Okay to proceed with treatment today per Dr.K.  TCHP, 4g IV magnesium,20 mEq potassium chloride p.o x 1 dose, and Norvasc 10 mg p.o x 1dose given today per MD orders. Tolerated infusion without adverse affects. Vital signs stable. No complaints at this time. Discharged from clinic ambulatory in stable condition. Alert and oriented x 3. F/U with [facility] as scheduled.

## 2022-01-20 NOTE — Progress Notes (Signed)
Patient has been examined by Dr. Delton Coombes, and vital signs and labs have been reviewed. ANC, Creatinine, LFTs, hemoglobin, and platelets are within treatment parameters per M.D. - pt may proceed with treatment - dose reduced docetaxel and carboplatin. We will also give mag 4 g IV for result of 1.0.

## 2022-01-26 NOTE — Progress Notes (Unsigned)
[place] [address]  [address] Phone: [phone] Fax: [phone]  [facility] PROGRESS NOTE   Traci Fuller [id number] Feb 08, 1956 66 y.o.  Traci Fuller is managed by Dr. 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  Actively treated with chemotherapy/immunotherapy/hormonal therapy: YES  Current therapy:  TCHP (docetaxel + carboplatin + trastuzumab + pertuzumab) every 21 days  Last treated: 01/20/2022 (cycle #4)  Next scheduled appointment with provider: 02/10/2022 (due for cycle #5)  Subjective:  Chief Complaint: Diarrhea, weakness, falls ***  Traci Fuller is managed by Dr. Delton Coombes for her oligometastatic breast cancer.  She is on [facility] every 21 days, last treated on 01/20/2022.  She is accompanied today by her daughter, Traci Fuller. ***   DIARRHEA: *** Patient has been having severe diarrhea for the past week, with 6-8 watery yellow stools per day.  No abdominal pain, hematochezia, fever, or chills. *** She has been taking the maximum dose of Lomotil (2 tablets every 6 hours) plus as needed Imodium in between doses of Lomotil, but without relief of her diarrhea. *** This has been further complicated by nausea and vomiting, and she reports that she has vomited up her pills on several occasions.  For her nausea and vomiting, she is taking Compazine every 6 hours, and has been taking some of her daughter's sublingual Zofran tablets, but without any significant benefit.  ORAL INTAKE: *** Oral intake has been extremely poor over the past week.  She reports that her only "food" yesterday was a popsicle, but that she "vomited it right back up."  She has drunk very little water, and has vomited water on some occasions as well.  FUNCTIONAL STATUS/FALLS: *** She reports extreme weakness and fatigue is progressive over the past week.  She fell 2 days ago and  reports that she hit her head.  She denies any headaches or neurologic deficits.  No syncopal episodes, but she does have lightheadedness with standing.  She usually takes amlodipine and metoprolol at home, but she has been holding these medications for the past several days due to low blood pressure at home.  Blood pressure today is  *** .   Review of Systems:  ***  Review of Systems  Constitutional:  Positive for activity change, appetite change, fatigue and unexpected weight change. Negative for chills, diaphoresis and fever.  HENT:  Negative for mouth sores, nosebleeds, sore throat and trouble swallowing.   Respiratory:  Negative for cough and shortness of breath.   Cardiovascular:  Positive for palpitations. Negative for chest pain and leg swelling.  Gastrointestinal:  Positive for diarrhea, nausea and vomiting. Negative for abdominal pain, blood in stool and constipation.  Genitourinary:  Negative for dysuria and hematuria.  Neurological:  Positive for dizziness, weakness, light-headedness and headaches. Negative for numbness.  Psychiatric/Behavioral:  Negative for dysphoric mood and sleep disturbance. The patient is not nervous/anxious.     Past Medical History, Surgical history, Social history, and Family history were reviewed as documented elsewhere in chart, and were updated as appropriate.   Assessment & Plan:    1.  Dehydration with acute kidney injury  ***  - Secondary to chemo-induced diarrhea, nausea, and vomiting as below - Evidenced by marginal BP 110/71, borderline tachycardia 94 - Creatinine today is 3.08/BUN 46, significantly increased from creatinine 1.18 on 12/21/2021 - PLAN: 1 L IV fluids given in clinic. - Due to AKI and current inability to tolerate adequate oral  intake, we will admit patient to hospital for ongoing IV fluids and symptom management.  2.  Hypokalemia ***  - Potassium today 2.0 - Likely secondary to GI losses from diarrhea and vomiting - PLAN: 40 mEq  oral potassium +40 mg IV potassium given in clinic today - Additional potassium per hospitalists  3.  Diarrhea, chemo-induced ***  - Diarrhea has been complicated by the fact that she has not been able to keep down her antidiarrheal medications - PLAN: Once patient is able to tolerate oral intake and keep her medication down, would recommend continuing Lomotil 2 tablets every 6 hours with Imodium as needed in between. - Since she has been admitted to the hospital for supportive care, it would be reasonable to check stool cultures to rule out infectious etiology.  4.  Nausea and vomiting, chemo-induced ***  - Unable to tolerate oral intake - Little relief from Compazine or Zofran - PLAN: Patient will receive IV antiemetics while hospitalized - Continue Compazine and Zofran, we will reassess at next visit and consider trying on alternative agent  5.  Locally advanced oligometastatic left breast cancer ***  - Primary oncologist is Dr. Delton Coombes - She received cycle #4 of TCHP (docetaxel + carboplatin + trastuzumab + pertuzumab) on 01/20/2022 - PLAN: Next scheduled appointment with Dr. Delton Coombes is 02/10/2022    Objective:   Physical Exam:  ***  There were no vitals taken for this visit. ECOG: 3  Physical Exam Constitutional:      Appearance: Normal appearance. She is obese.  HENT:     Head: Normocephalic and atraumatic.     Mouth/Throat:     Mouth: Mucous membranes are moist.  Eyes:     Extraocular Movements: Extraocular movements intact.     Pupils: Pupils are equal, round, and reactive to light.  Cardiovascular:     Rate and Rhythm: Regular rhythm. Tachycardia present.     Pulses: Normal pulses.     Heart sounds: Normal heart sounds.  Pulmonary:     Effort: Pulmonary effort is normal.     Breath sounds: Normal breath sounds.  Abdominal:     General: Bowel sounds are normal.     Palpations: Abdomen is soft.     Tenderness: There is no abdominal tenderness.   Musculoskeletal:        General: No swelling.     Right lower leg: No edema.     Left lower leg: No edema.  Lymphadenopathy:     Cervical: No cervical adenopathy.  Skin:    General: Skin is warm and dry.     Comments: Dry, poor turgor  Neurological:     General: No focal deficit present.     Mental Status: She is alert and oriented to person, place, and time.  Psychiatric:        Mood and Affect: Mood normal.        Behavior: Behavior normal.    Lab Review:     Component Value Date/Time   NA 141 01/20/2022 0815   NA 142 01/19/2018 1224   K 3.3 (L) 01/20/2022 0815   CL 112 (H) 01/20/2022 0815   CO2 26 01/20/2022 0815   GLUCOSE 117 (H) 01/20/2022 0815   BUN 5 (L) 01/20/2022 0815   BUN 10 01/19/2018 1224   CREATININE 0.95 01/20/2022 0815   CALCIUM 8.0 (L) 01/20/2022 0815   PROT 6.1 (L) 01/20/2022 0815   PROT 6.6 03/09/2018 0758   ALBUMIN 2.9 (L) 01/20/2022 0815   ALBUMIN  4.1 03/09/2018 0758   AST 14 (L) 01/20/2022 0815   ALT 8 01/20/2022 0815   ALKPHOS 120 01/20/2022 0815   BILITOT 0.4 01/20/2022 0815   BILITOT 0.6 03/09/2018 0758   GFRNONAA >60 01/20/2022 0815   GFRAA 91 01/19/2018 1224       Component Value Date/Time   WBC 6.4 01/20/2022 0815   RBC 2.92 (L) 01/20/2022 0815   HGB 9.3 (L) 01/20/2022 0815   HCT 30.3 (L) 01/20/2022 0815   PLT 272 01/20/2022 0815   MCV 103.8 (H) 01/20/2022 0815   MCH 31.8 01/20/2022 0815   MCHC 30.7 01/20/2022 0815   RDW 19.4 (H) 01/20/2022 0815   LYMPHSABS 1.0 01/20/2022 0815   MONOABS 0.5 01/20/2022 0815   EOSABS 0.2 01/20/2022 0815   BASOSABS 0.1 01/20/2022 0815   -------------------------------  Imaging from last 24 hours (if applicable):  Radiology interpretation: CT ABDOMEN PELVIS WO CONTRAST  Result Date: 01/05/2022 CLINICAL DATA:  Abdominal pain, nausea, vomiting, diarrhea x3 weeks EXAM: CT ABDOMEN AND PELVIS WITHOUT CONTRAST TECHNIQUE: Multidetector CT imaging of the abdomen and pelvis was performed following the  standard protocol without IV contrast. RADIATION DOSE REDUCTION: This exam was performed according to the departmental dose-optimization program which includes automated exposure control, adjustment of the mA and/or kV according to patient size and/or use of iterative reconstruction technique. COMPARISON:  PET-CT done on 10/22/2021 FINDINGS: Lower chest: Minimal left pleural effusion is seen. There are scattered coronary artery calcifications. There is skin thickening in the visualized lower portion of left breast. Hepatobiliary: Liver measures 19.1 cm. Evaluation is limited without contrast enhancement. There is no dilation of bile ducts. Gallbladder is unremarkable. Pancreas: There is atrophy.  No focal abnormality is seen. Spleen: Unremarkable. Adrenals/Urinary Tract: Adrenals are unremarkable. There is no hydronephrosis. There are no renal or ureteral stones. Urinary bladder is almost completely empty limiting evaluation. Stomach/Bowel: Stomach is unremarkable. Small bowel loops are not dilated. Appendix is not dilated. There is no significant wall thickening in colon. Liquid stool is seen in the lumen of left colon. There is no pericolic stranding. There is stranding in the fat planes adjacent to the posterior aspect of rectum. There is 3.7 x 2 cm subtle inhomogeneous increased density in the presacral region posterior to the rectum. No loculated fluid collections are seen in the pelvic cavity. Vascular/Lymphatic: Extensive arterial calcifications are seen. There is 3.3 cm infrarenal aortic aneurysm. No significant lymphadenopathy in the mesentery. There are few subcentimeter nodes in the posterior mediastinum behind the lower thoracic esophagus. Reproductive: Uterus is not seen. Other: There is no ascites or pneumoperitoneum. Umbilical hernia containing fat is seen. Musculoskeletal: No focal lytic or sclerotic lesions are seen in bony structures. IMPRESSION: There is fluid in the lumen of colon and rectum  suggesting possible nonspecific enterocolitis. There is no significant wall thickening in colon. There is no evidence of intestinal obstruction or pneumoperitoneum. There is no hydronephrosis. Appendix is not dilated. There is stranding in the perirectal fat plane, especially posteriorly. There is patchy 3.7 cm density between sacrum and rectum which may be suggestive of chronic inflammatory process. There is no loculated perirectal fluid collection. 3.3 cm infrarenal aortic aneurysm is seen. Follow-up CT every 3 years may be considered. Minimal left pleural effusion. There are scattered coronary artery calcifications. Enlarged liver. Other findings as described in the body of the report. Electronically Signed   By: Elmer Picker M.D.   On: 01/05/2022 19:06   US RENAL  Result Date: 01/02/2022 CLINICAL DATA:  Acute renal insufficiency.  Breast cancer. EXAM: RENAL / URINARY TRACT ULTRASOUND COMPLETE COMPARISON:  PET-CT 10/22/2021 FINDINGS: Right Kidney: Renal measurements: 9.2 x 3.8 x 4.8 cm = volume: 89.1 mL. Normal renal cortical thickness and echogenicity. No focal lesions or hydronephrosis. Left Kidney: Renal measurements: 8.7 x 4.5 x 4.3 cm = volume: 87.9 mL. Normal renal cortical thickness and echogenicity without focal lesions or hydronephrosis. Bladder: Appears normal for degree of bladder distention. Other: None. IMPRESSION: Normal renal ultrasound examination. Electronically Signed   By: Marijo Sanes M.D.   On: 01/02/2022 12:40      Wrap-Up:    All questions were answered. The patient knows to call the clinic with any problems, questions or concerns.  Medical decision making:  ***   Time spent on visit: I spent  ***  minutes counseling the patient face to face. The total time spent in the appointment was  ***  minutes and more than 50% was on counseling.   Harriett Rush, PA-C   ***

## 2022-01-27 ENCOUNTER — Inpatient Hospital Stay (HOSPITAL_COMMUNITY): Payer: Managed Care, Other (non HMO)

## 2022-01-27 ENCOUNTER — Inpatient Hospital Stay (HOSPITAL_COMMUNITY): Payer: Managed Care, Other (non HMO) | Admitting: Hematology

## 2022-01-27 ENCOUNTER — Inpatient Hospital Stay (HOSPITAL_COMMUNITY): Payer: Managed Care, Other (non HMO) | Attending: Hematology | Admitting: Physician Assistant

## 2022-01-27 VITALS — BP 117/70 | HR 76 | Temp 97.5°F | Resp 18

## 2022-01-27 DIAGNOSIS — K521 Toxic gastroenteritis and colitis: Secondary | ICD-10-CM | POA: Diagnosis not present

## 2022-01-27 DIAGNOSIS — Z170 Estrogen receptor positive status [ER+]: Secondary | ICD-10-CM

## 2022-01-27 DIAGNOSIS — R112 Nausea with vomiting, unspecified: Secondary | ICD-10-CM

## 2022-01-27 DIAGNOSIS — N179 Acute kidney failure, unspecified: Secondary | ICD-10-CM

## 2022-01-27 DIAGNOSIS — Z5111 Encounter for antineoplastic chemotherapy: Secondary | ICD-10-CM | POA: Insufficient documentation

## 2022-01-27 DIAGNOSIS — Z95828 Presence of other vascular implants and grafts: Secondary | ICD-10-CM

## 2022-01-27 DIAGNOSIS — Z79899 Other long term (current) drug therapy: Secondary | ICD-10-CM | POA: Diagnosis not present

## 2022-01-27 DIAGNOSIS — E860 Dehydration: Secondary | ICD-10-CM

## 2022-01-27 DIAGNOSIS — C50812 Malignant neoplasm of overlapping sites of left female breast: Secondary | ICD-10-CM | POA: Insufficient documentation

## 2022-01-27 DIAGNOSIS — T451X5A Adverse effect of antineoplastic and immunosuppressive drugs, initial encounter: Secondary | ICD-10-CM

## 2022-01-27 LAB — CBC WITH DIFFERENTIAL/PLATELET
Abs Immature Granulocytes: 0.09 10*3/uL — ABNORMAL HIGH (ref 0.00–0.07)
Basophils Absolute: 0.1 10*3/uL (ref 0.0–0.1)
Basophils Relative: 1 %
Eosinophils Absolute: 0.1 10*3/uL (ref 0.0–0.5)
Eosinophils Relative: 1 %
HCT: 35.2 % — ABNORMAL LOW (ref 36.0–46.0)
Hemoglobin: 11.3 g/dL — ABNORMAL LOW (ref 12.0–15.0)
Immature Granulocytes: 1 %
Lymphocytes Relative: 11 %
Lymphs Abs: 1.3 10*3/uL (ref 0.7–4.0)
MCH: 32.7 pg (ref 26.0–34.0)
MCHC: 32.1 g/dL (ref 30.0–36.0)
MCV: 101.7 fL — ABNORMAL HIGH (ref 80.0–100.0)
Monocytes Absolute: 1 10*3/uL (ref 0.1–1.0)
Monocytes Relative: 8 %
Neutro Abs: 9.1 10*3/uL — ABNORMAL HIGH (ref 1.7–7.7)
Neutrophils Relative %: 78 %
Platelets: 245 10*3/uL (ref 150–400)
RBC: 3.46 MIL/uL — ABNORMAL LOW (ref 3.87–5.11)
RDW: 18.3 % — ABNORMAL HIGH (ref 11.5–15.5)
WBC: 11.6 10*3/uL — ABNORMAL HIGH (ref 4.0–10.5)
nRBC: 0 % (ref 0.0–0.2)

## 2022-01-27 LAB — COMPREHENSIVE METABOLIC PANEL
ALT: 62 U/L — ABNORMAL HIGH (ref 0–44)
AST: 64 U/L — ABNORMAL HIGH (ref 15–41)
Albumin: 4 g/dL (ref 3.5–5.0)
Alkaline Phosphatase: 264 U/L — ABNORMAL HIGH (ref 38–126)
Anion gap: 8 (ref 5–15)
BUN: 31 mg/dL — ABNORMAL HIGH (ref 8–23)
CO2: 23 mmol/L (ref 22–32)
Calcium: 10.2 mg/dL (ref 8.9–10.3)
Chloride: 104 mmol/L (ref 98–111)
Creatinine, Ser: 1.87 mg/dL — ABNORMAL HIGH (ref 0.44–1.00)
GFR, Estimated: 30 mL/min — ABNORMAL LOW (ref >60)
Glucose, Bld: 134 mg/dL — ABNORMAL HIGH (ref 70–99)
Potassium: 4.6 mmol/L (ref 3.5–5.1)
Sodium: 135 mmol/L (ref 135–145)
Total Bilirubin: 1 mg/dL (ref 0.3–1.2)
Total Protein: 7.6 g/dL (ref 6.5–8.1)

## 2022-01-27 LAB — MAGNESIUM: Magnesium: 1.8 mg/dL (ref 1.7–2.4)

## 2022-01-27 MED ORDER — ONDANSETRON 8 MG PO TBDP: 8.0000 mg | ORAL_TABLET | Freq: Three times a day (TID) | ORAL | 0 refills | Status: DC

## 2022-01-27 MED ORDER — HEPARIN SOD (PORK) LOCK FLUSH 100 UNIT/ML IV SOLN
500.0000 [IU] | Freq: Once | INTRAVENOUS | Status: AC
  Administered 2022-01-27: 500 [IU] via INTRAVENOUS

## 2022-01-27 MED ORDER — SODIUM CHLORIDE 0.9% FLUSH
10.0000 mL | INTRAVENOUS | Status: DC | PRN
  Administered 2022-01-27: 10 mL via INTRAVENOUS

## 2022-01-27 MED ORDER — SCOPOLAMINE 1 MG/3DAYS TD PT72: 1.0000 | MEDICATED_PATCH | TRANSDERMAL | 12 refills | Status: DC

## 2022-01-27 MED ORDER — SODIUM CHLORIDE 0.9 % IV SOLN: Freq: Once | INTRAVENOUS | Status: AC

## 2022-01-27 NOTE — Patient Instructions (Signed)
[place] at [facility] Discharge Instructions  You were seen today by Tarri Abernethy PA-C for your chemotherapy symptom management visit.    Due to your severe dehydration and inability to tolerate adequate food or drink right now, we will schedule you for repeat labs and IV fluids tomorrow (Thursday 01/28/2022) and the day after (Friday 01/29/2022).  I will schedule you for reevaluation with me on Friday 01/29/2022 to see if you have improved.  I have written a new prescription for a SCOPOLAMINE PATCH.  This should be placed behind your ear to help decrease nausea.  This patch should be changed every 72 hours.  Take Zofran 8 mg orally dissolving tablet (placed under tongue to dissolve in mouth, rather than swallowed) every 8 hours, whether you feel nauseous or not.  Try to take your antidiarrhea medication (Lomotil x2 tablets) about half an hour after each dose of your Zofran to see if you are able to keep it down.  You can also continue to take Imodium after each episode of diarrhea, if you are able to take Imodium without vomiting it back up.  See if you are able to drink an Ensure or eat a small meal about half an hour after each dose of Zofran.  Do NOT take any blood pressure medication (amlodipine or metoprolol) until you have been told by our office to restart it.  Your blood pressure is too low while you are dehydrated.    Thank you for choosing [place] at [facility] to provide your oncology and hematology care.  To afford each patient quality time with our provider, please arrive at least 15 minutes before your scheduled appointment time.   If you have a lab appointment with the [facility] please come in thru the Main Entrance and check in at the main information desk.  You need to re-schedule your appointment should you arrive 10 or more minutes late.  We strive to give you quality time with our providers, and arriving late  affects you and other patients whose appointments are after yours.  Also, if you no show three or more times for appointments you may be dismissed from the clinic at the providers discretion.     Again, thank you for choosing [facility].  Our hope is that these requests will decrease the amount of time that you wait before being seen by our physicians.       _____________________________________________________________  Should you have questions after your visit to [facility], please contact our office at [phone] and follow the prompts.  Our office hours are 8:00 a.m. and 4:30 p.m. Monday - Friday.  Please note that voicemails left after 4:00 p.m. may not be returned until the following business day.  We are closed weekends and major holidays.  You do have access to a nurse 24-7, just call the main number to the clinic [phone] and do not press any options, hold on the line and a nurse will answer the phone.    For prescription refill requests, have your pharmacy contact our office and allow 72 hours.    Due to Covid, you will need to wear a mask upon entering the hospital. If you do not have a mask, a mask will be given to you at the Main Entrance upon arrival. For doctor visits, patients may have 1 support person age 7 or older with them. For treatment visits, patients can not have  anyone with them due to social distancing guidelines and our immunocompromised population.     

## 2022-01-27 NOTE — Progress Notes (Signed)
Patient presents today for labs and possible IVF.  Patient is in stable condition with complaints of weakness.  Vital signs are stable.  Labs reviewed.  Creatinine today is 1.87, AST is 64, and ALT is 62.  Tarri Abernethy, PA-C aware.  Patient will receive one liter of NS over one hour per RP, PA-C.     Patient tolerated fluids well with no complaints voiced.  Patient left ambulatory in stable condition.  Vital signs stable at discharge.  Follow up as scheduled.

## 2022-01-27 NOTE — Progress Notes (Signed)
Patients port flushed without difficulty.  Good blood return noted with no bruising or swelling noted at site.  Patient remains accessed for possible IVF.   

## 2022-01-27 NOTE — Patient Instructions (Signed)
[facility]  Discharge Instructions: Thank you for choosing [facility] to provide your oncology and hematology care.  If you have a lab appointment with the Cancer Center, please come in thru the Main Entrance and check in at the main information desk.  Wear comfortable clothing and clothing appropriate for easy access to any Portacath or PICC line.   We strive to give you quality time with your provider. You may need to reschedule your appointment if you arrive late (15 or more minutes).  Arriving late affects you and other patients whose appointments are after yours.  Also, if you miss three or more appointments without notifying the office, you may be dismissed from the clinic at the provider's discretion.      For prescription refill requests, have your pharmacy contact our office and allow 72 hours for refills to be completed.        To help prevent nausea and vomiting after your treatment, we encourage you to take your nausea medication as directed.  BELOW ARE SYMPTOMS THAT SHOULD BE REPORTED IMMEDIATELY: *FEVER GREATER THAN 100.4 F (38 C) OR HIGHER *CHILLS OR SWEATING *NAUSEA AND VOMITING THAT IS NOT CONTROLLED WITH YOUR NAUSEA MEDICATION *UNUSUAL SHORTNESS OF BREATH *UNUSUAL BRUISING OR BLEEDING *URINARY PROBLEMS (pain or burning when urinating, or frequent urination) *BOWEL PROBLEMS (unusual diarrhea, constipation, pain near the anus) TENDERNESS IN MOUTH AND THROAT WITH OR WITHOUT PRESENCE OF ULCERS (sore throat, sores in mouth, or a toothache) UNUSUAL RASH, SWELLING OR PAIN  UNUSUAL VAGINAL DISCHARGE OR ITCHING   Items with * indicate a potential emergency and should be followed up as soon as possible or go to the Emergency Department if any problems should occur.  Please show the CHEMOTHERAPY ALERT CARD or IMMUNOTHERAPY ALERT CARD at check-in to the Emergency Department and triage nurse.  Should you have questions after your visit or need to cancel  or reschedule your appointment, please contact [facility] [phone]  and follow the prompts.  Office hours are 8:00 a.m. to 4:30 p.m. Monday - Friday. Please note that voicemails left after 4:00 p.m. may not be returned until the following business day.  We are closed weekends and major holidays. You have access to a nurse at all times for urgent questions. Please call the main number to the clinic [phone] and follow the prompts.  For any non-urgent questions, you may also contact your provider using MyChart. We now offer e-Visits for anyone 18 and older to request care online for non-urgent symptoms. For details visit [email or web address].   Also download the MyChart app! Go to the app store, search "MyChart", open the app, select [facility], and log in with your MyChart username and password.  Due to Covid, a mask is required upon entering the hospital/clinic. If you do not have a mask, one will be given to you upon arrival. For doctor visits, patients may have 1 support person aged 18 or older with them. For treatment visits, patients cannot have anyone with them due to current Covid guidelines and our immunocompromised population.  

## 2022-01-28 ENCOUNTER — Inpatient Hospital Stay (HOSPITAL_COMMUNITY): Payer: Managed Care, Other (non HMO)

## 2022-01-28 DIAGNOSIS — E860 Dehydration: Secondary | ICD-10-CM

## 2022-01-28 DIAGNOSIS — T451X5A Adverse effect of antineoplastic and immunosuppressive drugs, initial encounter: Secondary | ICD-10-CM

## 2022-01-28 DIAGNOSIS — K521 Toxic gastroenteritis and colitis: Secondary | ICD-10-CM

## 2022-01-28 DIAGNOSIS — C50812 Malignant neoplasm of overlapping sites of left female breast: Secondary | ICD-10-CM | POA: Diagnosis not present

## 2022-01-28 DIAGNOSIS — N179 Acute kidney failure, unspecified: Secondary | ICD-10-CM

## 2022-01-28 LAB — MAGNESIUM: Magnesium: 1.5 mg/dL — ABNORMAL LOW (ref 1.7–2.4)

## 2022-01-28 LAB — BASIC METABOLIC PANEL
Anion gap: 11 (ref 5–15)
BUN: 31 mg/dL — ABNORMAL HIGH (ref 8–23)
CO2: 20 mmol/L — ABNORMAL LOW (ref 22–32)
Calcium: 9.9 mg/dL (ref 8.9–10.3)
Chloride: 106 mmol/L (ref 98–111)
Creatinine, Ser: 2.36 mg/dL — ABNORMAL HIGH (ref 0.44–1.00)
GFR, Estimated: 22 mL/min — ABNORMAL LOW (ref >60)
Glucose, Bld: 128 mg/dL — ABNORMAL HIGH (ref 70–99)
Potassium: 3.7 mmol/L (ref 3.5–5.1)
Sodium: 137 mmol/L (ref 135–145)

## 2022-01-28 MED ORDER — SODIUM CHLORIDE 0.9 % IV SOLN: Freq: Once | INTRAVENOUS | Status: AC

## 2022-01-28 MED ORDER — SODIUM CHLORIDE 0.9 % IV SOLN: INTRAVENOUS | Status: DC

## 2022-01-28 MED ORDER — MAGNESIUM SULFATE 2 GM/50ML IV SOLN
2.0000 g | Freq: Once | INTRAVENOUS | Status: AC
  Administered 2022-01-28: 2 g via INTRAVENOUS
  Filled 2022-01-28: qty 50

## 2022-01-28 NOTE — Progress Notes (Signed)
[place] [address] [place] [address] Phone: [phone] Fax: [phone]  [facility] PROGRESS NOTE   KIRSTI MCALPINE [id number] 03-27-1956 66 y.o.  Traci Fuller is managed by Dr. 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  Actively treated with chemotherapy/immunotherapy/hormonal therapy: YES  Current therapy:  TCHP (docetaxel + carboplatin + trastuzumab + pertuzumab) every 21 days  Last treated: 01/20/2022 (cycle #4)  Next scheduled appointment with provider: 02/10/2022 (due for cycle #5)  Subjective:  Chief Complaint: Diarrhea, weakness, falls   [name] is managed by Dr. Delton Coombes for her oligometastatic breast cancer.  She is on [facility] every 21 days, last treated on 01/20/2022.  She was seen for symptom management visit by me on 01/27/2022 for management of her dehydration, diarrhea, nausea/vomiting.   At her last visit, she was started on scopolamine patch and around-the-clock Zofran (8 mg ODT every 8 hours).  She reports that this has slightly improved her nausea and vomiting, and that she has been able to keep her antidiarrhea medications down yesterday and today. She still has intermittent nausea, but only vomited once yesterday.  Oral intake is still low - total intake yesterday was 8 ounces orange juice and 8 ounces of tomato juice. She continues to have diarrhea about 6 times daily, which is decreased from previously having diarrhea 10-12 times daily. She remains fatigued, but is slightly improved after IV fluids.  She was able to bathe and dress herself without assistance today, whereas she was requiring assistance with ADLs earlier this week. She denies any lightheadedness or syncope.  She continues to have dyspnea with exertion. No peripheral edema or shortness of breath at rest.   Review of Systems:    Review of Systems   Constitutional:  Positive for activity change, appetite change, fatigue and unexpected weight change. Negative for chills, diaphoresis and fever.  HENT:  Negative for mouth sores, nosebleeds, sore throat and trouble swallowing.   Respiratory:  Negative for cough and shortness of breath.   Cardiovascular:  Positive for palpitations. Negative for chest pain and leg swelling.  Gastrointestinal:  Positive for abdominal pain, diarrhea, nausea and vomiting. Negative for blood in stool and constipation.  Genitourinary:  Negative for dysuria and hematuria.  Neurological:  Positive for weakness and headaches. Negative for dizziness, light-headedness and numbness.  Psychiatric/Behavioral:  Positive for dysphoric mood. Negative for sleep disturbance and suicidal ideas. The patient is nervous/anxious.      Past Medical History, Surgical history, Social history, and Family history were reviewed as documented elsewhere in chart, and were updated as appropriate.   Assessment & Plan:    1.  Dehydration with acute kidney injury    - Secondary to chemo-induced diarrhea, nausea, and vomiting as below - Creatinine from 01/27/2022 was 1.87/BUN 31 (increased from creatinine 0.95 on 01/20/2022) - Received 1 L NS on 01/27/2022 and 2 L NS on 01/28/2022 (creatinine 2.36) - Creatinine today (01/29/2022): 1.99/BUN 21, mild hypokalemia with potassium 3.4 - Vitals today are marginal with BP 111/74 and heart rate 101.  Afebrile, no hypoxia. - PLAN: 2 L IV fluids given in clinic (including potassium repletion). - We will schedule for additional IV fluids to be given at [facility] on Saturday. - Patient will return for repeat BMP/magnesium and additional IV fluids on Monday and Tuesday next week - I will see her for follow-up symptom management visit on Tuesday, 02/02/2022  2.  Hypotension   -  Marginal BP 111/74 in clinic today - Likely secondary to dehydration related to GI losses from diarrhea and vomiting -  PLAN: Patient instructed to HOLD her amlodipine and metoprolol until instructed to retake them by our office (since visit on 01/27/2022)  3.  Nausea and vomiting, chemo-induced   - Unable to tolerate oral intake - Little relief from Compazine or Zofran, is vomiting back of her medication - Slight improvement with scopolamine patch and around-the-clock sublingual 8 mg Zofran ODT - PLAN: Continue scopolamine patch every 72 hours.   -Continue 8 mg Zofran ODT to be taken around-the-clock every 8 hours.  4.  Diarrhea, chemo-induced   - Diarrhea has been complicated by the fact that she has not been able to keep down her antidiarrheal medications - After adjustment of antinausea medications, she has been able to keep her antidiuretic medications down, with some improvement in her diarrhea. - PLAN:  Continue taking 2 tablets of Lomotil about half an hour after each dose of Zofran.  She can continue to try Imodium after each bowel movement, if she is able to keep it down.  5.  Weight loss - Patient has lost 15 pounds in the past week, secondary to poor oral intake, nausea/vomiting, and diarrhea   - She is followed by dietitian - PLAN: Symptom management of nausea and diarrhea as above - Recommend to patient that she try eating and drinking small meals about half an hour after taking her Zofran to see if she is able to keep this down.  6.  Locally advanced oligometastatic left breast cancer   - Primary oncologist is Dr. Delton Coombes - She received cycle #4 of TCHP (docetaxel + carboplatin + trastuzumab + pertuzumab) on 01/20/2022 - PLAN: Next scheduled appointment with Dr. Delton Coombes is 02/10/2022    Objective:   Physical Exam:      There were no vitals taken for this visit. ECOG: 3    Physical Exam Constitutional:      Appearance: Normal appearance. She is ill-appearing.     Comments: Loose skin noted with evidence of recent rapid weight loss  HENT:     Head: Normocephalic and atraumatic.      Mouth/Throat:     Mouth: Mucous membranes are moist.  Eyes:     Extraocular Movements: Extraocular movements intact.     Pupils: Pupils are equal, round, and reactive to light.  Cardiovascular:     Rate and Rhythm: Regular rhythm. Tachycardia present.     Pulses: Normal pulses.     Heart sounds: Normal heart sounds.  Pulmonary:     Effort: Pulmonary effort is normal.     Breath sounds: Normal breath sounds. No rales.  Abdominal:     General: Bowel sounds are normal.     Palpations: Abdomen is soft.     Tenderness: There is no abdominal tenderness.  Musculoskeletal:        General: No swelling.     Right lower leg: Edema (Trace) present.     Left lower leg: Edema (Trace) present.  Lymphadenopathy:     Cervical: No cervical adenopathy.  Skin:    General: Skin is warm and dry.     Comments: Skin turgor improved.  Neurological:     General: No focal deficit present.     Mental Status: She is alert and oriented to person, place, and time.  Psychiatric:        Mood and Affect: Mood normal.        Behavior: Behavior  normal.     Lab Review:     Component Value Date/Time   NA 137 01/28/2022 0812   NA 142 01/19/2018 1224   K 3.7 01/28/2022 0812   CL 106 01/28/2022 0812   CO2 20 (L) 01/28/2022 0812   GLUCOSE 128 (H) 01/28/2022 0812   BUN 31 (H) 01/28/2022 0812   BUN 10 01/19/2018 1224   CREATININE 2.36 (H) 01/28/2022 0812   CALCIUM 9.9 01/28/2022 0812   PROT 7.6 01/27/2022 1022   PROT 6.6 03/09/2018 0758   ALBUMIN 4.0 01/27/2022 1022   ALBUMIN 4.1 03/09/2018 0758   AST 64 (H) 01/27/2022 1022   ALT 62 (H) 01/27/2022 1022   ALKPHOS 264 (H) 01/27/2022 1022   BILITOT 1.0 01/27/2022 1022   BILITOT 0.6 03/09/2018 0758   GFRNONAA 22 (L) 01/28/2022 0812   GFRAA 91 01/19/2018 1224       Component Value Date/Time   WBC 11.6 (H) 01/27/2022 1022   RBC 3.46 (L) 01/27/2022 1022   HGB 11.3 (L) 01/27/2022 1022   HCT 35.2 (L) 01/27/2022 1022   PLT 245 01/27/2022 1022   MCV  101.7 (H) 01/27/2022 1022   MCH 32.7 01/27/2022 1022   MCHC 32.1 01/27/2022 1022   RDW 18.3 (H) 01/27/2022 1022   LYMPHSABS 1.3 01/27/2022 1022   MONOABS 1.0 01/27/2022 1022   EOSABS 0.1 01/27/2022 1022   BASOSABS 0.1 01/27/2022 1022   -------------------------------  Imaging from last 24 hours (if applicable):  Radiology interpretation: CT ABDOMEN PELVIS WO CONTRAST  Result Date: 01/05/2022 CLINICAL DATA:  Abdominal pain, nausea, vomiting, diarrhea x3 weeks EXAM: CT ABDOMEN AND PELVIS WITHOUT CONTRAST TECHNIQUE: Multidetector CT imaging of the abdomen and pelvis was performed following the standard protocol without IV contrast. RADIATION DOSE REDUCTION: This exam was performed according to the departmental dose-optimization program which includes automated exposure control, adjustment of the mA and/or kV according to patient size and/or use of iterative reconstruction technique. COMPARISON:  PET-CT done on 10/22/2021 FINDINGS: Lower chest: Minimal left pleural effusion is seen. There are scattered coronary artery calcifications. There is skin thickening in the visualized lower portion of left breast. Hepatobiliary: Liver measures 19.1 cm. Evaluation is limited without contrast enhancement. There is no dilation of bile ducts. Gallbladder is unremarkable. Pancreas: There is atrophy.  No focal abnormality is seen. Spleen: Unremarkable. Adrenals/Urinary Tract: Adrenals are unremarkable. There is no hydronephrosis. There are no renal or ureteral stones. Urinary bladder is almost completely empty limiting evaluation. Stomach/Bowel: Stomach is unremarkable. Small bowel loops are not dilated. Appendix is not dilated. There is no significant wall thickening in colon. Liquid stool is seen in the lumen of left colon. There is no pericolic stranding. There is stranding in the fat planes adjacent to the posterior aspect of rectum. There is 3.7 x 2 cm subtle inhomogeneous increased density in the presacral region  posterior to the rectum. No loculated fluid collections are seen in the pelvic cavity. Vascular/Lymphatic: Extensive arterial calcifications are seen. There is 3.3 cm infrarenal aortic aneurysm. No significant lymphadenopathy in the mesentery. There are few subcentimeter nodes in the posterior mediastinum behind the lower thoracic esophagus. Reproductive: Uterus is not seen. Other: There is no ascites or pneumoperitoneum. Umbilical hernia containing fat is seen. Musculoskeletal: No focal lytic or sclerotic lesions are seen in bony structures. IMPRESSION: There is fluid in the lumen of colon and rectum suggesting possible nonspecific enterocolitis. There is no significant wall thickening in colon. There is no evidence of intestinal obstruction or pneumoperitoneum. There  is no hydronephrosis. Appendix is not dilated. There is stranding in the perirectal fat plane, especially posteriorly. There is patchy 3.7 cm density between sacrum and rectum which may be suggestive of chronic inflammatory process. There is no loculated perirectal fluid collection. 3.3 cm infrarenal aortic aneurysm is seen. Follow-up CT every 3 years may be considered. Minimal left pleural effusion. There are scattered coronary artery calcifications. Enlarged liver. Other findings as described in the body of the report. Electronically Signed   By: Elmer Picker M.D.   On: 01/05/2022 19:06   US RENAL  Result Date: 01/02/2022 CLINICAL DATA:  Acute renal insufficiency.  Breast cancer. EXAM: RENAL / URINARY TRACT ULTRASOUND COMPLETE COMPARISON:  PET-CT 10/22/2021 FINDINGS: Right Kidney: Renal measurements: 9.2 x 3.8 x 4.8 cm = volume: 89.1 mL. Normal renal cortical thickness and echogenicity. No focal lesions or hydronephrosis. Left Kidney: Renal measurements: 8.7 x 4.5 x 4.3 cm = volume: 87.9 mL. Normal renal cortical thickness and echogenicity without focal lesions or hydronephrosis. Bladder: Appears normal for degree of bladder distention.  Other: None. IMPRESSION: Normal renal ultrasound examination. Electronically Signed   By: Marijo Sanes M.D.   On: 01/02/2022 12:40      Wrap-Up:    All questions were answered. The patient knows to call the clinic with any problems, questions or concerns.  Medical decision making: Moderate    Time spent on visit: I spent 25 minutes counseling the patient face to face. The total time spent in the appointment was 40 minutes and more than 50% was on counseling.   Harriett Rush, PA-C  01/29/2022 1:20 PM

## 2022-01-28 NOTE — Patient Instructions (Signed)
[facility]  Discharge Instructions: Thank you for choosing [facility] to provide your oncology and hematology care.  If you have a lab appointment with the [facility], please come in thru the Main Entrance and check in at the main information desk.  Wear comfortable clothing and clothing appropriate for easy access to any Portacath or PICC line.   We strive to give you quality time with your provider. You may need to reschedule your appointment if you arrive late (15 or more minutes).  Arriving late affects you and other patients whose appointments are after yours.  Also, if you miss three or more appointments without notifying the office, you may be dismissed from the clinic at the provider's discretion.      For prescription refill requests, have your pharmacy contact our office and allow 72 hours for refills to be completed.    Today you received the following chemotherapy and/or immunotherapy agents hydration fluids and magnesium today      To help prevent nausea and vomiting after your treatment, we encourage you to take your nausea medication as directed.  BELOW ARE SYMPTOMS THAT SHOULD BE REPORTED IMMEDIATELY: *FEVER GREATER THAN 100.4 F (38 C) OR HIGHER *CHILLS OR SWEATING *NAUSEA AND VOMITING THAT IS NOT CONTROLLED WITH YOUR NAUSEA MEDICATION *UNUSUAL SHORTNESS OF BREATH *UNUSUAL BRUISING OR BLEEDING *URINARY PROBLEMS (pain or burning when urinating, or frequent urination) *BOWEL PROBLEMS (unusual diarrhea, constipation, pain near the anus) TENDERNESS IN MOUTH AND THROAT WITH OR WITHOUT PRESENCE OF ULCERS (sore throat, sores in mouth, or a toothache) UNUSUAL RASH, SWELLING OR PAIN  UNUSUAL VAGINAL DISCHARGE OR ITCHING   Items with * indicate a potential emergency and should be followed up as soon as possible or go to the Emergency Department if any problems should occur.  Please show the CHEMOTHERAPY ALERT CARD or IMMUNOTHERAPY ALERT CARD at  check-in to the Emergency Department and triage nurse.  Should you have questions after your visit or need to cancel or reschedule your appointment, please contact [facility] [phone]  and follow the prompts.  Office hours are 8:00 a.m. to 4:30 p.m. Monday - Friday. Please note that voicemails left after 4:00 p.m. may not be returned until the following business day.  We are closed weekends and major holidays. You have access to a nurse at all times for urgent questions. Please call the main number to the clinic [phone] and follow the prompts.  For any non-urgent questions, you may also contact your provider using MyChart. We now offer e-Visits for anyone 34 and older to request care online for non-urgent symptoms. For details visit [email or web address].   Also download the MyChart app! Go to the app store, search "MyChart", open the app, select [facility], and log in with your MyChart username and password.  Due to Covid, a mask is required upon entering the hospital/clinic. If you do not have a mask, one will be given to you upon arrival. For doctor visits, patients may have 1 support person aged 30 or older with them. For treatment visits, patients cannot have anyone with them due to current Covid guidelines and our immunocompromised population.

## 2022-01-28 NOTE — Progress Notes (Signed)
Hydration fluids and magnesium given per orders. Patient tolerated it well without problems. Vitals stable and discharged home from clinic ambulatory. Follow up as scheduled.

## 2022-01-29 ENCOUNTER — Inpatient Hospital Stay (HOSPITAL_COMMUNITY): Payer: Managed Care, Other (non HMO)

## 2022-01-29 ENCOUNTER — Other Ambulatory Visit: Payer: Self-pay | Admitting: *Deleted

## 2022-01-29 ENCOUNTER — Inpatient Hospital Stay (HOSPITAL_COMMUNITY): Payer: Managed Care, Other (non HMO) | Admitting: Physician Assistant

## 2022-01-29 DIAGNOSIS — T451X5A Adverse effect of antineoplastic and immunosuppressive drugs, initial encounter: Secondary | ICD-10-CM

## 2022-01-29 DIAGNOSIS — R112 Nausea with vomiting, unspecified: Secondary | ICD-10-CM

## 2022-01-29 DIAGNOSIS — N179 Acute kidney failure, unspecified: Secondary | ICD-10-CM

## 2022-01-29 DIAGNOSIS — E860 Dehydration: Secondary | ICD-10-CM | POA: Diagnosis not present

## 2022-01-29 DIAGNOSIS — K521 Toxic gastroenteritis and colitis: Secondary | ICD-10-CM

## 2022-01-29 DIAGNOSIS — C50812 Malignant neoplasm of overlapping sites of left female breast: Secondary | ICD-10-CM | POA: Diagnosis not present

## 2022-01-29 DIAGNOSIS — E876 Hypokalemia: Secondary | ICD-10-CM

## 2022-01-29 DIAGNOSIS — Z170 Estrogen receptor positive status [ER+]: Secondary | ICD-10-CM

## 2022-01-29 DIAGNOSIS — C50919 Malignant neoplasm of unspecified site of unspecified female breast: Secondary | ICD-10-CM

## 2022-01-29 LAB — BASIC METABOLIC PANEL
Anion gap: 9 (ref 5–15)
BUN: 21 mg/dL (ref 8–23)
CO2: 19 mmol/L — ABNORMAL LOW (ref 22–32)
Calcium: 9.6 mg/dL (ref 8.9–10.3)
Chloride: 111 mmol/L (ref 98–111)
Creatinine, Ser: 1.99 mg/dL — ABNORMAL HIGH (ref 0.44–1.00)
GFR, Estimated: 28 mL/min — ABNORMAL LOW (ref >60)
Glucose, Bld: 130 mg/dL — ABNORMAL HIGH (ref 70–99)
Potassium: 3.4 mmol/L — ABNORMAL LOW (ref 3.5–5.1)
Sodium: 139 mmol/L (ref 135–145)

## 2022-01-29 LAB — MAGNESIUM: Magnesium: 1.8 mg/dL (ref 1.7–2.4)

## 2022-01-29 MED ORDER — POTASSIUM CHLORIDE IN NACL 20-0.9 MEQ/L-% IV SOLN
Freq: Once | INTRAVENOUS | Status: AC
  Filled 2022-01-29: qty 1000

## 2022-01-29 MED ORDER — ONDANSETRON HCL 4 MG/2ML IJ SOLN
4.0000 mg | Freq: Once | INTRAMUSCULAR | Status: AC
  Administered 2022-01-29: 4 mg via INTRAVENOUS
  Filled 2022-01-29: qty 2

## 2022-01-29 MED ORDER — SODIUM CHLORIDE 0.9 % IV SOLN: Freq: Once | INTRAVENOUS | Status: AC

## 2022-01-29 MED ORDER — MAGNESIUM SULFATE 2 GM/50ML IV SOLN
2.0000 g | Freq: Once | INTRAVENOUS | Status: AC
  Administered 2022-01-29: 2 g via INTRAVENOUS
  Filled 2022-01-29: qty 50

## 2022-01-29 NOTE — Patient Instructions (Signed)
[facility]  Discharge Instructions: Thank you for choosing [facility] to provide your oncology and hematology care.  If you have a lab appointment with the Cancer Center, please come in thru the Main Entrance and check in at the main information desk.  Wear comfortable clothing and clothing appropriate for easy access to any Portacath or PICC line.   We strive to give you quality time with your provider. You may need to reschedule your appointment if you arrive late (15 or more minutes).  Arriving late affects you and other patients whose appointments are after yours.  Also, if you miss three or more appointments without notifying the office, you may be dismissed from the clinic at the provider's discretion.      For prescription refill requests, have your pharmacy contact our office and allow 72 hours for refills to be completed.        To help prevent nausea and vomiting after your treatment, we encourage you to take your nausea medication as directed.  BELOW ARE SYMPTOMS THAT SHOULD BE REPORTED IMMEDIATELY: *FEVER GREATER THAN 100.4 F (38 C) OR HIGHER *CHILLS OR SWEATING *NAUSEA AND VOMITING THAT IS NOT CONTROLLED WITH YOUR NAUSEA MEDICATION *UNUSUAL SHORTNESS OF BREATH *UNUSUAL BRUISING OR BLEEDING *URINARY PROBLEMS (pain or burning when urinating, or frequent urination) *BOWEL PROBLEMS (unusual diarrhea, constipation, pain near the anus) TENDERNESS IN MOUTH AND THROAT WITH OR WITHOUT PRESENCE OF ULCERS (sore throat, sores in mouth, or a toothache) UNUSUAL RASH, SWELLING OR PAIN  UNUSUAL VAGINAL DISCHARGE OR ITCHING   Items with * indicate a potential emergency and should be followed up as soon as possible or go to the Emergency Department if any problems should occur.  Please show the CHEMOTHERAPY ALERT CARD or IMMUNOTHERAPY ALERT CARD at check-in to the Emergency Department and triage nurse.  Should you have questions after your visit or need to cancel  or reschedule your appointment, please contact [facility] [phone]  and follow the prompts.  Office hours are 8:00 a.m. to 4:30 p.m. Monday - Friday. Please note that voicemails left after 4:00 p.m. may not be returned until the following business day.  We are closed weekends and major holidays. You have access to a nurse at all times for urgent questions. Please call the main number to the clinic [phone] and follow the prompts.  For any non-urgent questions, you may also contact your provider using MyChart. We now offer e-Visits for anyone 18 and older to request care online for non-urgent symptoms. For details visit [email or web address].   Also download the MyChart app! Go to the app store, search "MyChart", open the app, select [facility], and log in with your MyChart username and password.  Due to Covid, a mask is required upon entering the hospital/clinic. If you do not have a mask, one will be given to you upon arrival. For doctor visits, patients may have 1 support person aged 18 or older with them. For treatment visits, patients cannot have anyone with them due to current Covid guidelines and our immunocompromised population.  

## 2022-01-29 NOTE — Addendum Note (Signed)
Addended by: Tarri Abernethy on: 01/29/2022 04:36 PM   Modules accepted: Orders

## 2022-01-29 NOTE — Progress Notes (Signed)
Patients port flushed without difficulty.  Good blood return noted with no bruising or swelling noted at site.  Patient remains accessed for possible IVF.   

## 2022-01-29 NOTE — Progress Notes (Signed)
Patient presents today for IVF.  Patient complains of nausea and weakness today. Vital signs are stable.  Labs reviewed by Tarri Abernethy, PA-C.  Patient will receive 1 L NS x two hours, as well as 1 L house fluids x 2 hours today per RP, PA-C.    Patient tolerated IVF well with no complaints voiced.  Patient left in stable condition.  Vital signs stable at discharge.  Follow up as scheduled.

## 2022-01-29 NOTE — Patient Instructions (Addendum)
[facility] at [facility] Discharge Instructions  You were seen today by Tarri Abernethy PA-C for your chemotherapy symptom management visit.    Due to your severe dehydration and difficulty tolerating adequate food or drink right now, we will schedule you for IV fluids tomorrow at [facility] in [place]. APPOINTMENT TIME: 8:30 AM (please arrive by 8:15 AM) LOCATION: [facility] [phone]. [address] [place], [place] [address])  We will schedule you for repeat labs and IV fluids on Monday 02/01/2022 and Tuesday 02/02/2022.  I will see you for another symptom management visit on Tuesday 02/02/2022.  Continue to use your SCOPOLAMINE PATCH.  This should be placed behind your ear to help decrease nausea.  This patch should be changed every 72 hours.  Continue to take Zofran 8 mg orally dissolving tablet (placed under tongue to dissolve in mouth, rather than swallowed) every 8 hours, whether you feel nauseous or not.  Continue to try to take your antidiarrhea medication (Lomotil x2 tablets) about half an hour after each dose of your Zofran (three times daily) to see if you are able to keep it down.  You can also continue to take Imodium after each episode of diarrhea, if you are able to take Imodium without vomiting it back up.  See if you are able to drink an Ensure or eat a small meal about half an hour after each dose of Zofran.  Do NOT take any blood pressure medication (amlodipine or metoprolol) until you have been told by our office to restart it.  Your blood pressure is too low while you are dehydrated.  - - - - - - - - - - - - - - -    Thank you for choosing [place] at [facility] to provide your oncology and hematology care.  To afford each patient quality time with our provider, please arrive at least 15 minutes before your scheduled appointment time.   If you have a lab appointment with the [facility]  please come in thru the Main Entrance and check in at the main information desk.  You need to re-schedule your appointment should you arrive 10 or more minutes late.  We strive to give you quality time with our providers, and arriving late affects you and other patients whose appointments are after yours.  Also, if you no show three or more times for appointments you may be dismissed from the clinic at the providers discretion.     Again, thank you for choosing [facility].  Our hope is that these requests will decrease the amount of time that you wait before being seen by our physicians.       _____________________________________________________________  Should you have questions after your visit to [facility], please contact our office at [phone] and follow the prompts.  Our office hours are 8:00 a.m. and 4:30 p.m. Monday - Friday.  Please note that voicemails left after 4:00 p.m. may not be returned until the following business day.  We are closed weekends and major holidays.  You do have access to a nurse 24-7, just call the main number to the clinic [phone] and do not press any options, hold on the line and a nurse will answer the phone.    For prescription refill requests, have your pharmacy contact our office and allow 72 hours.    Due to Covid, you will need to wear a  mask upon entering the hospital. If you do not have a mask, a mask will be given to you at the Main Entrance upon arrival. For doctor visits, patients may have 1 support person age 53 or older with them. For treatment visits, patients can not have anyone with them due to social distancing guidelines and our immunocompromised population.

## 2022-01-30 ENCOUNTER — Inpatient Hospital Stay: Payer: Managed Care, Other (non HMO) | Attending: Oncology

## 2022-01-30 VITALS — BP 127/81 | HR 89 | Temp 97.5°F | Resp 18

## 2022-01-30 DIAGNOSIS — C50812 Malignant neoplasm of overlapping sites of left female breast: Secondary | ICD-10-CM | POA: Insufficient documentation

## 2022-01-30 DIAGNOSIS — Z79899 Other long term (current) drug therapy: Secondary | ICD-10-CM | POA: Diagnosis not present

## 2022-01-30 DIAGNOSIS — R110 Nausea: Secondary | ICD-10-CM

## 2022-01-30 DIAGNOSIS — Z170 Estrogen receptor positive status [ER+]: Secondary | ICD-10-CM | POA: Insufficient documentation

## 2022-01-30 MED ORDER — HEPARIN SOD (PORK) LOCK FLUSH 100 UNIT/ML IV SOLN
500.0000 [IU] | Freq: Once | INTRAVENOUS | Status: AC | PRN
  Administered 2022-01-30: 500 [IU]

## 2022-01-30 MED ORDER — SODIUM CHLORIDE 0.9% FLUSH
10.0000 mL | Freq: Once | INTRAVENOUS | Status: AC | PRN
  Administered 2022-01-30: 10 mL

## 2022-01-30 MED ORDER — ONDANSETRON HCL 4 MG/2ML IJ SOLN
8.0000 mg | Freq: Once | INTRAMUSCULAR | Status: AC
  Administered 2022-01-30: 8 mg via INTRAVENOUS
  Filled 2022-01-30: qty 4

## 2022-01-30 MED ORDER — SODIUM CHLORIDE 0.9 % IV SOLN: Freq: Once | INTRAVENOUS | Status: DC

## 2022-01-30 MED ORDER — POTASSIUM CHLORIDE IN NACL 40-0.9 MEQ/L-% IV SOLN
Freq: Once | INTRAVENOUS | Status: AC
  Filled 2022-01-30: qty 1000

## 2022-01-30 NOTE — Progress Notes (Signed)
Pt complaining of nausea.  States same as it has been over the last few days.  Zofran '8mg'$  IV given per supportive treatment plan

## 2022-02-01 ENCOUNTER — Inpatient Hospital Stay (HOSPITAL_COMMUNITY): Payer: Managed Care, Other (non HMO) | Admitting: Dietician

## 2022-02-01 ENCOUNTER — Inpatient Hospital Stay (HOSPITAL_COMMUNITY): Payer: Managed Care, Other (non HMO)

## 2022-02-01 ENCOUNTER — Encounter (HOSPITAL_COMMUNITY): Payer: Self-pay

## 2022-02-01 VITALS — BP 153/76 | HR 77 | Resp 18

## 2022-02-01 DIAGNOSIS — C50812 Malignant neoplasm of overlapping sites of left female breast: Secondary | ICD-10-CM | POA: Diagnosis not present

## 2022-02-01 DIAGNOSIS — C50919 Malignant neoplasm of unspecified site of unspecified female breast: Secondary | ICD-10-CM

## 2022-02-01 DIAGNOSIS — E860 Dehydration: Secondary | ICD-10-CM

## 2022-02-01 DIAGNOSIS — Z95828 Presence of other vascular implants and grafts: Secondary | ICD-10-CM

## 2022-02-01 DIAGNOSIS — Z170 Estrogen receptor positive status [ER+]: Secondary | ICD-10-CM

## 2022-02-01 DIAGNOSIS — E876 Hypokalemia: Secondary | ICD-10-CM

## 2022-02-01 DIAGNOSIS — T451X5A Adverse effect of antineoplastic and immunosuppressive drugs, initial encounter: Secondary | ICD-10-CM

## 2022-02-01 DIAGNOSIS — N179 Acute kidney failure, unspecified: Secondary | ICD-10-CM

## 2022-02-01 LAB — BASIC METABOLIC PANEL
Anion gap: 5 (ref 5–15)
BUN: 8 mg/dL (ref 8–23)
CO2: 19 mmol/L — ABNORMAL LOW (ref 22–32)
Calcium: 9.2 mg/dL (ref 8.9–10.3)
Chloride: 114 mmol/L — ABNORMAL HIGH (ref 98–111)
Creatinine, Ser: 1.31 mg/dL — ABNORMAL HIGH (ref 0.44–1.00)
GFR, Estimated: 45 mL/min — ABNORMAL LOW (ref >60)
Glucose, Bld: 104 mg/dL — ABNORMAL HIGH (ref 70–99)
Potassium: 3.4 mmol/L — ABNORMAL LOW (ref 3.5–5.1)
Sodium: 138 mmol/L (ref 135–145)

## 2022-02-01 LAB — MAGNESIUM: Magnesium: 1.4 mg/dL — ABNORMAL LOW (ref 1.7–2.4)

## 2022-02-01 MED ORDER — MAGNESIUM SULFATE 2 GM/50ML IV SOLN
2.0000 g | Freq: Once | INTRAVENOUS | Status: AC
  Administered 2022-02-01: 2 g via INTRAVENOUS
  Filled 2022-02-01: qty 50

## 2022-02-01 MED ORDER — SODIUM CHLORIDE 0.9% FLUSH
10.0000 mL | Freq: Once | INTRAVENOUS | Status: AC
  Administered 2022-02-01: 10 mL via INTRAVENOUS

## 2022-02-01 MED ORDER — HEPARIN SOD (PORK) LOCK FLUSH 100 UNIT/ML IV SOLN
500.0000 [IU] | Freq: Once | INTRAVENOUS | Status: AC | PRN
  Administered 2022-02-01: 500 [IU]

## 2022-02-01 MED ORDER — POTASSIUM CHLORIDE IN NACL 20-0.9 MEQ/L-% IV SOLN
Freq: Once | INTRAVENOUS | Status: AC
  Filled 2022-02-01: qty 1000

## 2022-02-01 MED ORDER — SODIUM CHLORIDE 0.9% FLUSH
10.0000 mL | Freq: Once | INTRAVENOUS | Status: AC | PRN
  Administered 2022-02-01: 10 mL

## 2022-02-01 NOTE — Patient Instructions (Signed)
[facility]  Discharge Instructions: Thank you for choosing [facility] to provide your oncology and hematology care.  If you have a lab appointment with the [facility], please come in thru the Main Entrance and check in at the main information desk.  Wear comfortable clothing and clothing appropriate for easy access to any Portacath or PICC line.   We strive to give you quality time with your provider. You may need to reschedule your appointment if you arrive late (15 or more minutes).  Arriving late affects you and other patients whose appointments are after yours.  Also, if you miss three or more appointments without notifying the office, you may be dismissed from the clinic at the provider's discretion.      For prescription refill requests, have your pharmacy contact our office and allow 72 hours for refills to be completed.    Today you received the following House fluids, return as scheduled.   To help prevent nausea and vomiting after your treatment, we encourage you to take your nausea medication as directed.  BELOW ARE SYMPTOMS THAT SHOULD BE REPORTED IMMEDIATELY: *FEVER GREATER THAN 100.4 F (38 C) OR HIGHER *CHILLS OR SWEATING *NAUSEA AND VOMITING THAT IS NOT CONTROLLED WITH YOUR NAUSEA MEDICATION *UNUSUAL SHORTNESS OF BREATH *UNUSUAL BRUISING OR BLEEDING *URINARY PROBLEMS (pain or burning when urinating, or frequent urination) *BOWEL PROBLEMS (unusual diarrhea, constipation, pain near the anus) TENDERNESS IN MOUTH AND THROAT WITH OR WITHOUT PRESENCE OF ULCERS (sore throat, sores in mouth, or a toothache) UNUSUAL RASH, SWELLING OR PAIN  UNUSUAL VAGINAL DISCHARGE OR ITCHING   Items with * indicate a potential emergency and should be followed up as soon as possible or go to the Emergency Department if any problems should occur.  Please show the CHEMOTHERAPY ALERT CARD or IMMUNOTHERAPY ALERT CARD at check-in to the Emergency Department and triage  nurse.  Should you have questions after your visit or need to cancel or reschedule your appointment, please contact [facility] [phone]  and follow the prompts.  Office hours are 8:00 a.m. to 4:30 p.m. Monday - Friday. Please note that voicemails left after 4:00 p.m. may not be returned until the following business day.  We are closed weekends and major holidays. You have access to a nurse at all times for urgent questions. Please call the main number to the clinic [phone] and follow the prompts.  For any non-urgent questions, you may also contact your provider using MyChart. We now offer e-Visits for anyone 107 and older to request care online for non-urgent symptoms. For details visit [email or web address].   Also download the MyChart app! Go to the app store, search "MyChart", open the app, select [facility], and log in with your MyChart username and password.  Due to Covid, a mask is required upon entering the hospital/clinic. If you do not have a mask, one will be given to you upon arrival. For doctor visits, patients may have 1 support person aged 75 or older with them. For treatment visits, patients cannot have anyone with them due to current Covid guidelines and our immunocompromised population.

## 2022-02-01 NOTE — Progress Notes (Signed)
Nutrition Follow-up:  Patient with breast cancer. She is receiving TCHP q21d.   Brief nutrition follow-up completed with patient during IVF infusion. Patient reports receiving handouts in the mail. She reports "trying to do better." Patient is "grateful" for having IV fluids available to "keep her going." Patient reports ongoing diarrhea, nausea, and vomiting that starts the day after treatment. She is able to tolerate yogurt and tomato juice during this time. Patient did not take home nutrition samples left at registration on 6/6. This RD handed bag with samples to patient in infusion.   Weight 146 lb 6.4 oz on 6/9 decreased 12 lbs (7.6%) in 9 days; significant  5/31 - 158 lb 12.8 oz   Nutrition diagnosis: Unintentional weight loss ongoing  Intervention: Reviewed tips for nausea, vomiting, and diarrhea - pt has handouts Encouraged small frequent meals/snacks Samples of banatrol, Pedialyte hydration powder, [employer] [place], Ensure Complete, CIB powder, + coupons provided   Continue lomotil and imodium  Continue zofran - odt Continue scopolamine patch Patient has contact information   Next visit: via telephone ~ 3 weeks

## 2022-02-01 NOTE — Progress Notes (Signed)
Patient presents today for fluids, per Tarri Abernethy PA, give house fluids. Creatinine 1.31 and BUN 8 per Tarri Abernethy PA no additional fluids needed for today. Patient tolerated house fluids with no complaints voiced. Side effects with management reviewed with understanding verbalized. Port site clean and dry with no bruising or swelling noted at site. Good blood return noted before and after administration of therapy. New dressing applied. Patient left in satisfactory condition with VSS and no s/s of distress noted.

## 2022-02-02 ENCOUNTER — Inpatient Hospital Stay (HOSPITAL_COMMUNITY): Payer: Managed Care, Other (non HMO)

## 2022-02-02 ENCOUNTER — Inpatient Hospital Stay (HOSPITAL_COMMUNITY): Payer: Managed Care, Other (non HMO) | Admitting: Physician Assistant

## 2022-02-02 ENCOUNTER — Encounter (HOSPITAL_COMMUNITY): Payer: Self-pay

## 2022-02-02 DIAGNOSIS — E860 Dehydration: Secondary | ICD-10-CM

## 2022-02-02 DIAGNOSIS — T451X5A Adverse effect of antineoplastic and immunosuppressive drugs, initial encounter: Secondary | ICD-10-CM

## 2022-02-02 DIAGNOSIS — N179 Acute kidney failure, unspecified: Secondary | ICD-10-CM

## 2022-02-02 DIAGNOSIS — Z170 Estrogen receptor positive status [ER+]: Secondary | ICD-10-CM

## 2022-02-02 DIAGNOSIS — C50919 Malignant neoplasm of unspecified site of unspecified female breast: Secondary | ICD-10-CM

## 2022-02-02 DIAGNOSIS — K521 Toxic gastroenteritis and colitis: Secondary | ICD-10-CM

## 2022-02-02 DIAGNOSIS — R112 Nausea with vomiting, unspecified: Secondary | ICD-10-CM

## 2022-02-02 DIAGNOSIS — C50812 Malignant neoplasm of overlapping sites of left female breast: Secondary | ICD-10-CM | POA: Diagnosis not present

## 2022-02-02 DIAGNOSIS — E876 Hypokalemia: Secondary | ICD-10-CM

## 2022-02-02 LAB — BASIC METABOLIC PANEL
Anion gap: 7 (ref 5–15)
BUN: 7 mg/dL — ABNORMAL LOW (ref 8–23)
CO2: 20 mmol/L — ABNORMAL LOW (ref 22–32)
Calcium: 9.4 mg/dL (ref 8.9–10.3)
Chloride: 112 mmol/L — ABNORMAL HIGH (ref 98–111)
Creatinine, Ser: 1.3 mg/dL — ABNORMAL HIGH (ref 0.44–1.00)
GFR, Estimated: 46 mL/min — ABNORMAL LOW (ref >60)
Glucose, Bld: 123 mg/dL — ABNORMAL HIGH (ref 70–99)
Potassium: 3.7 mmol/L (ref 3.5–5.1)
Sodium: 139 mmol/L (ref 135–145)

## 2022-02-02 LAB — CBC
HCT: 29.7 % — ABNORMAL LOW (ref 36.0–46.0)
Hemoglobin: 9.4 g/dL — ABNORMAL LOW (ref 12.0–15.0)
MCH: 32.9 pg (ref 26.0–34.0)
MCHC: 31.6 g/dL (ref 30.0–36.0)
MCV: 103.8 fL — ABNORMAL HIGH (ref 80.0–100.0)
Platelets: 142 10*3/uL — ABNORMAL LOW (ref 150–400)
RBC: 2.86 MIL/uL — ABNORMAL LOW (ref 3.87–5.11)
RDW: 19.5 % — ABNORMAL HIGH (ref 11.5–15.5)
WBC: 10.2 10*3/uL (ref 4.0–10.5)
nRBC: 0 % (ref 0.0–0.2)

## 2022-02-02 LAB — MAGNESIUM: Magnesium: 1.9 mg/dL (ref 1.7–2.4)

## 2022-02-02 MED ORDER — SODIUM CHLORIDE 0.9% FLUSH
10.0000 mL | Freq: Once | INTRAVENOUS | Status: AC | PRN
  Administered 2022-02-02: 10 mL

## 2022-02-02 MED ORDER — DEXAMETHASONE 4 MG PO TABS: 8.0000 mg | ORAL_TABLET | Freq: Every day | ORAL | 0 refills | Status: DC

## 2022-02-02 MED ORDER — MAGNESIUM SULFATE 2 GM/50ML IV SOLN
2.0000 g | Freq: Once | INTRAVENOUS | Status: AC
  Administered 2022-02-02: 2 g via INTRAVENOUS
  Filled 2022-02-02: qty 50

## 2022-02-02 MED ORDER — OLANZAPINE 5 MG PO TABS: 5.0000 mg | ORAL_TABLET | Freq: Every day | ORAL | 0 refills | Status: DC

## 2022-02-02 MED ORDER — SODIUM CHLORIDE 0.9 % IV SOLN
Freq: Once | INTRAVENOUS | Status: AC
  Filled 2022-02-02: qty 4

## 2022-02-02 MED ORDER — HEPARIN SOD (PORK) LOCK FLUSH 100 UNIT/ML IV SOLN
500.0000 [IU] | Freq: Once | INTRAVENOUS | Status: AC | PRN
  Administered 2022-02-02: 500 [IU]

## 2022-02-02 MED ORDER — POTASSIUM CHLORIDE IN NACL 20-0.9 MEQ/L-% IV SOLN
Freq: Once | INTRAVENOUS | Status: AC
  Filled 2022-02-02: qty 1000

## 2022-02-02 NOTE — Patient Instructions (Signed)
[facility]  Discharge Instructions: Thank you for choosing [facility] to provide your oncology and hematology care.  If you have a lab appointment with the [facility], please come in thru the Main Entrance and check in at the main information desk.  Wear comfortable clothing and clothing appropriate for easy access to any Portacath or PICC line.   We strive to give you quality time with your provider. You may need to reschedule your appointment if you arrive late (15 or more minutes).  Arriving late affects you and other patients whose appointments are after yours.  Also, if you miss three or more appointments without notifying the office, you may be dismissed from the clinic at the provider's discretion.      For prescription refill requests, have your pharmacy contact our office and allow 72 hours for refills to be completed.    Today you received the following chemotherapy and/or immunotherapy agents Hydration.       To help prevent nausea and vomiting after your treatment, we encourage you to take your nausea medication as directed.  BELOW ARE SYMPTOMS THAT SHOULD BE REPORTED IMMEDIATELY: *FEVER GREATER THAN 100.4 F (38 C) OR HIGHER *CHILLS OR SWEATING *NAUSEA AND VOMITING THAT IS NOT CONTROLLED WITH YOUR NAUSEA MEDICATION *UNUSUAL SHORTNESS OF BREATH *UNUSUAL BRUISING OR BLEEDING *URINARY PROBLEMS (pain or burning when urinating, or frequent urination) *BOWEL PROBLEMS (unusual diarrhea, constipation, pain near the anus) TENDERNESS IN MOUTH AND THROAT WITH OR WITHOUT PRESENCE OF ULCERS (sore throat, sores in mouth, or a toothache) UNUSUAL RASH, SWELLING OR PAIN  UNUSUAL VAGINAL DISCHARGE OR ITCHING   Items with * indicate a potential emergency and should be followed up as soon as possible or go to the Emergency Department if any problems should occur.  Please show the CHEMOTHERAPY ALERT CARD or IMMUNOTHERAPY ALERT CARD at check-in to the Emergency  Department and triage nurse.  Should you have questions after your visit or need to cancel or reschedule your appointment, please contact [facility] [phone]  and follow the prompts.  Office hours are 8:00 a.m. to 4:30 p.m. Monday - Friday. Please note that voicemails left after 4:00 p.m. may not be returned until the following business day.  We are closed weekends and major holidays. You have access to a nurse at all times for urgent questions. Please call the main number to the clinic [phone] and follow the prompts.  For any non-urgent questions, you may also contact your provider using MyChart. We now offer e-Visits for anyone 54 and older to request care online for non-urgent symptoms. For details visit [email or web address].   Also download the MyChart app! Go to the app store, search "MyChart", open the app, select [facility], and log in with your MyChart username and password.  Masks are optional in the cancer centers. If you would like for your care team to wear a mask while they are taking care of you, please let them know. For doctor visits, patients may have with them one support person who is at least 65 years old. At this time, visitors are not allowed in the infusion area.

## 2022-02-02 NOTE — Progress Notes (Signed)
[facility] [address] [place] [address] Phone: [phone] Fax: [phone]  SYMPTOMS MANAGEMENT CLINIC PROGRESS NOTE   Traci Fuller [id number] [birth year] 66 y.o.  Traci Fuller is managed by Dr. 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  Actively treated with chemotherapy/immunotherapy/hormonal therapy: YES  Current therapy:  TCHP (docetaxel + carboplatin + trastuzumab + pertuzumab) every 21 days  Last treated: 01/20/2022 (cycle #4)  Next scheduled appointment with provider: 02/10/2022 (due for cycle #5)  Subjective:  Chief Complaint: Diarrhea, weakness, falls   Traci Fuller is managed by Dr. Delton Coombes for her oligometastatic breast cancer.  She is on [facility] every 21 days, last treated on 01/20/2022.  She was seen for symptom management visit by me on 01/27/2022 and 01/29/2022 for management of her severe dehydration, diarrhea, nausea/vomiting.  She has been receiving 1 to 2 L IV fluids daily in outpatient clinic.  Since her visit last week, she has continued to have diarrhea and nausea, but slightly improved from before.  She has been using scopolamine patch and scheduled Zofran (8 mg ODT every 8 hours).  She continues to have nausea when she tries to eat or drink, but states that she has not had any vomiting.  She has been able to keep her medication down.  She does complain of dry mouth since starting scopolamine patch.  She continues to have diarrhea, but decreased frequency (4 episodes daily, down from 10+ episodes daily).  She continues to have extremely poor oral intake, reports that yesterday she was able to drink 8 ounces of tomato juice, 8 ounces of orange juice, and eat some canned peaches.  She was unable to drink any water or eat any other foods. Her fatigue is slightly improved (energy 40%), and she has been able to walk longer distances.  She has been  independent with self-care this week.  She does have lightheadedness when she stands too fast, but denies any syncopal episodes.  She continues to have dyspnea on exertion.  No shortness of breath at rest or peripheral edema.   Review of Systems:      Review of Systems  Constitutional:  Positive for activity change, appetite change, fatigue and unexpected weight change. Negative for chills, diaphoresis and fever.  HENT:  Negative for mouth sores, nosebleeds, sore throat and trouble swallowing.   Respiratory:  Negative for cough and shortness of breath.   Cardiovascular:  Negative for chest pain, palpitations and leg swelling.  Gastrointestinal:  Positive for diarrhea and nausea. Negative for abdominal pain, blood in stool, constipation and vomiting.  Genitourinary:  Negative for dysuria and hematuria.  Neurological:  Positive for headaches. Negative for dizziness, weakness, light-headedness and numbness.  Psychiatric/Behavioral:  Negative for dysphoric mood, sleep disturbance and suicidal ideas. The patient is not nervous/anxious.      Past Medical History, Surgical history, Social history, and Family history were reviewed as documented elsewhere in chart, and were updated as appropriate.   Assessment & Plan:    1.  Dehydration with acute kidney injury      - Secondary to chemo-induced diarrhea, nausea, and vomiting as below - Kidney injury from this cycle peaked at 2.36 on 01/28/2022 - She has received 1 to 2 L IV fluids daily in the outpatient clinic for the past week - Creatinine today (02/02/2022) improved at 1.30/GFR 46 - PLAN: 1 L IV fluid given today.   - Ongoing fluids: We will give fluids this  week on Thursday and Friday, as well as next week on Monday before her chemotherapy next Wednesday. - She is encouraged to try to increase her oral intake at home, and to call and cancel her IV fluids if they are no longer needed. - She is scheduled for cycle #5 of TCHP on Wednesday 02/10/2022.   In anticipation of recurrent nausea, vomiting, and diarrhea, we will schedule her for labs/IV fluids on Friday 02/12/2022 and Monday 02/15/2022.  I will see her for symptom management visit on Monday 02/15/2022 to assess need for additional fluids for the next cycle of chemotherapy.    2.  Hypotension     - Marginal BP 94/62 in clinic today - Likely secondary to dehydration related to GI losses from diarrhea and vomiting - PLAN: Patient instructed to HOLD her amlodipine and metoprolol until instructed to retake them by our office (since visit on 01/27/2022)  3.  Chemotherapy-induced nausea and vomiting (CINV), delayed  - Unable to tolerate oral intake   - Little relief from Compazine or Zofran   - Slight improvement with scopolamine patch and around-the-clock sublingual 8 mg Zofran ODT   - As part of her treatment regimen, she is receiving dexamethasone, fosaprepitant, and palonosetron on Day 1 of her chemotherapy cycles - PLAN: Continue scopolamine patch every 72 hours.  Can STOP prior to next cycle of chemotherapy. - Continue 8 mg Zofran ODT to be taken around-the-clock every 8 hours. - With her next cycle of chemotherapy, we will add dexamethasone p.o. 8 mg daily (take in the mornings) on days 2-8.  We will add olanzapine 5 mg nightly on days 1-8.  We can extend the course of these medications if needed.  4.  Diarrhea, chemo-induced     - Diarrhea has been complicated by the fact that she has not been able to keep down her antidiarrheal medications - After adjustment of antinausea medications, she has been able to keep her antidiuretic medications down, with some improvement in her diarrhea. - PLAN:  Continue taking 2 tablets of Lomotil about half an hour after each dose of Zofran.  She can continue to try Imodium after each bowel movement, if she is able to keep it down.  5.  Weight loss   - Patient lost 15 pounds since Cycle #4 of chemotherapy, secondary to poor oral intake, nausea/vomiting,  and diarrhea - She has regained about 5 pounds in the past week after fluid resuscitation - She is followed by dietitian - PLAN: Symptom management of nausea and diarrhea as above - Recommend to patient that she try eating and drinking small meals about half an hour after taking her Zofran to see if she is able to keep this down.  6.  Locally advanced oligometastatic left breast cancer     - Primary oncologist is Dr. Delton Coombes - She received cycle #4 of TCHP (docetaxel + carboplatin + trastuzumab + pertuzumab) on 01/20/2022 - PLAN: Next scheduled appointment with Dr. Delton Coombes is 02/10/2022    Objective:   Physical Exam:      There were no vitals taken for this visit.   ECOG: 3    Physical Exam Constitutional:      Appearance: Normal appearance.     Comments: Loose skin noted with evidence of recent rapid weight loss  HENT:     Head: Normocephalic and atraumatic.     Mouth/Throat:     Mouth: Mucous membranes are moist.  Eyes:     Extraocular Movements: Extraocular movements intact.  Pupils: Pupils are equal, round, and reactive to light.  Cardiovascular:     Rate and Rhythm: Normal rate and regular rhythm.     Pulses: Normal pulses.     Heart sounds: Normal heart sounds.  Pulmonary:     Effort: Pulmonary effort is normal.     Breath sounds: Normal breath sounds. No rales.  Abdominal:     General: Bowel sounds are normal.     Palpations: Abdomen is soft.     Tenderness: There is no abdominal tenderness.  Musculoskeletal:        General: No swelling.     Right lower leg: No edema.     Left lower leg: No edema.  Lymphadenopathy:     Cervical: No cervical adenopathy.  Skin:    General: Skin is warm and dry.     Comments: Skin turgor improved.  Neurological:     General: No focal deficit present.     Mental Status: She is alert and oriented to person, place, and time.  Psychiatric:        Mood and Affect: Mood normal.        Behavior: Behavior normal.     Lab  Review:     Component Value Date/Time   NA 139 02/02/2022 0807   NA 142 01/19/2018 1224   K 3.7 02/02/2022 0807   CL 112 (H) 02/02/2022 0807   CO2 20 (L) 02/02/2022 0807   GLUCOSE 123 (H) 02/02/2022 0807   BUN 7 (L) 02/02/2022 0807   BUN 10 01/19/2018 1224   CREATININE 1.30 (H) 02/02/2022 0807   CALCIUM 9.4 02/02/2022 0807   PROT 7.6 01/27/2022 1022   PROT 6.6 03/09/2018 0758   ALBUMIN 4.0 01/27/2022 1022   ALBUMIN 4.1 03/09/2018 0758   AST 64 (H) 01/27/2022 1022   ALT 62 (H) 01/27/2022 1022   ALKPHOS 264 (H) 01/27/2022 1022   BILITOT 1.0 01/27/2022 1022   BILITOT 0.6 03/09/2018 0758   GFRNONAA 46 (L) 02/02/2022 0807   GFRAA 91 01/19/2018 1224       Component Value Date/Time   WBC 10.2 02/02/2022 0807   RBC 2.86 (L) 02/02/2022 0807   HGB 9.4 (L) 02/02/2022 0807   HCT 29.7 (L) 02/02/2022 0807   PLT 142 (L) 02/02/2022 0807   MCV 103.8 (H) 02/02/2022 0807   MCH 32.9 02/02/2022 0807   MCHC 31.6 02/02/2022 0807   RDW 19.5 (H) 02/02/2022 0807   LYMPHSABS 1.3 01/27/2022 1022   MONOABS 1.0 01/27/2022 1022   EOSABS 0.1 01/27/2022 1022   BASOSABS 0.1 01/27/2022 1022   -------------------------------  Imaging from last 24 hours (if applicable):  Radiology interpretation: CT ABDOMEN PELVIS WO CONTRAST  Result Date: 01/05/2022 CLINICAL DATA:  Abdominal pain, nausea, vomiting, diarrhea x3 weeks EXAM: CT ABDOMEN AND PELVIS WITHOUT CONTRAST TECHNIQUE: Multidetector CT imaging of the abdomen and pelvis was performed following the standard protocol without IV contrast. RADIATION DOSE REDUCTION: This exam was performed according to the departmental dose-optimization program which includes automated exposure control, adjustment of the mA and/or kV according to patient size and/or use of iterative reconstruction technique. COMPARISON:  PET-CT done on 10/22/2021 FINDINGS: Lower chest: Minimal left pleural effusion is seen. There are scattered coronary artery calcifications. There is skin  thickening in the visualized lower portion of left breast. Hepatobiliary: Liver measures 19.1 cm. Evaluation is limited without contrast enhancement. There is no dilation of bile ducts. Gallbladder is unremarkable. Pancreas: There is atrophy.  No focal abnormality is seen. Spleen:  Unremarkable. Adrenals/Urinary Tract: Adrenals are unremarkable. There is no hydronephrosis. There are no renal or ureteral stones. Urinary bladder is almost completely empty limiting evaluation. Stomach/Bowel: Stomach is unremarkable. Small bowel loops are not dilated. Appendix is not dilated. There is no significant wall thickening in colon. Liquid stool is seen in the lumen of left colon. There is no pericolic stranding. There is stranding in the fat planes adjacent to the posterior aspect of rectum. There is 3.7 x 2 cm subtle inhomogeneous increased density in the presacral region posterior to the rectum. No loculated fluid collections are seen in the pelvic cavity. Vascular/Lymphatic: Extensive arterial calcifications are seen. There is 3.3 cm infrarenal aortic aneurysm. No significant lymphadenopathy in the mesentery. There are few subcentimeter nodes in the posterior mediastinum behind the lower thoracic esophagus. Reproductive: Uterus is not seen. Other: There is no ascites or pneumoperitoneum. Umbilical hernia containing fat is seen. Musculoskeletal: No focal lytic or sclerotic lesions are seen in bony structures. IMPRESSION: There is fluid in the lumen of colon and rectum suggesting possible nonspecific enterocolitis. There is no significant wall thickening in colon. There is no evidence of intestinal obstruction or pneumoperitoneum. There is no hydronephrosis. Appendix is not dilated. There is stranding in the perirectal fat plane, especially posteriorly. There is patchy 3.7 cm density between sacrum and rectum which may be suggestive of chronic inflammatory process. There is no loculated perirectal fluid collection. 3.3 cm  infrarenal aortic aneurysm is seen. Follow-up CT every 3 years may be considered. Minimal left pleural effusion. There are scattered coronary artery calcifications. Enlarged liver. Other findings as described in the body of the report. Electronically Signed   By: Elmer Picker M.D.   On: 01/05/2022 19:06      Wrap-Up:    All questions were answered. The patient knows to call the clinic with any problems, questions or concerns.  Medical decision making: Moderate       Time spent on visit: I spent 25 minutes counseling the patient face to face. The total time spent in the appointment was 40 minutes and more than 50% was on counseling.   Traci Rush, PA-C   02/02/22 6:19 PM

## 2022-02-02 NOTE — Patient Instructions (Addendum)
[place] at [facility] Discharge Instructions  You were seen today by Tarri Abernethy PA-C for your chemotherapy symptom management visit.    Due to your severe dehydration and difficulty tolerating adequate food or drink right now, we will continue to give IV fluids and recheck labs as follows. Wednesday 6/14: No IV fluids (CT scan in [facility]) Thursday 6/15: IV fluids Friday 6/16: Labs + IV fluids **Weekend off** Monday 6/19: IV fluids **Tuesday 6/20 off ** Wednesday 6/21: Visit with Dr. Delton Coombes and cycle #5 of chemotherapy **Thursday 6/22 off ** Friday 6/23: IV fluids **Weekend off ** Monday 6/26: Labs + IV fluids + symptom management visit with Tarri Abernethy PA-C **Try to push yourself to drink more water at home.  If you are able to start keeping fluids down, please call and cancel your IV fluid appointments if they are no longer needed.   I will see you for another symptom management visit on Tuesday 02/02/2022.  You can try discontinuing your your [facility], since this is causing significant dry mouth.  (You can restart your scopolamine patch if stopping it causes significantly increased nausea).  Continue to take Zofran 8 mg orally dissolving tablet (placed under tongue to dissolve in mouth, rather than swallowed) every 8 hours, whether you feel nauseous or not.  If your nausea is well controlled, you can start to back off of your Zofran.  Continue to try to take your antidiarrhea medication (Lomotil x2 tablets) about half an hour after each dose of your Zofran (three times daily) to see if you are able to keep it down.  You can also continue to take Imodium after each episode of diarrhea, if you are able to take Imodium without vomiting it back up.  See if you are able to drink an Ensure or eat a small meal about half an hour after each dose of Zofran.  Do NOT take any blood pressure medication (amlodipine or metoprolol) until you  have been told by our office to restart it.  Your blood pressure is too low while you are dehydrated.  For your next cycle of chemotherapy (starting on Wednesday 02/10/2022), we are adding to additional medications to try to prevent delayed nausea and vomiting.  Prescription will be sent to your pharmacy today so that you can have these medications ready, but do NOT take them until the following days: Dexamethasone 8 mg: Take this pill in the MORNINGS x7 days, starting with the first day after chemotherapy (Thursday 6/22) Olanzapine 5 mg: Take this pill AT BEDTIME x8 days, starting the same evening as your next cycle of chemotherapy (Wednesday 6/21)  - - - - - - - - - - - - - - -    Thank you for choosing [place] at [facility] to provide your oncology and hematology care.  To afford each patient quality time with our provider, please arrive at least 15 minutes before your scheduled appointment time.   If you have a lab appointment with the [place] please come in thru the Main Entrance and check in at the main information desk.  You need to re-schedule your appointment should you arrive 10 or more minutes late.  We strive to give you quality time with our providers, and arriving late affects you and other patients whose appointments are after yours.  Also, if you no show three or more times for appointments you may be dismissed from the clinic at the providers  discretion.     Again, thank you for choosing [facility].  Our hope is that these requests will decrease the amount of time that you wait before being seen by our physicians.       _____________________________________________________________  Should you have questions after your visit to [facility], please contact our office at [phone] and follow the prompts.  Our office hours are 8:00 a.m. and 4:30 p.m. Monday - Friday.  Please note that voicemails left after 4:00 p.m. may not  be returned until the following business day.  We are closed weekends and major holidays.  You do have access to a nurse 24-7, just call the main number to the clinic [phone] and do not press any options, hold on the line and a nurse will answer the phone.    For prescription refill requests, have your pharmacy contact our office and allow 72 hours.    Due to Covid, you will need to wear a mask upon entering the hospital. If you do not have a mask, a mask will be given to you at the Main Entrance upon arrival. For doctor visits, patients may have 1 support person age 36 or older with them. For treatment visits, patients can not have anyone with them due to social distancing guidelines and our immunocompromised population.

## 2022-02-02 NOTE — Progress Notes (Signed)
Patients port flushed without difficulty.  Good blood return noted with no bruising or swelling noted at site.  Stable during access and blood draw.  Patient to remain accessed for fluids.   

## 2022-02-02 NOTE — Progress Notes (Signed)
Patient presents today for house fluids and symptom management appointment with [place] PA.   Patient has complaints of fatigue and nausea not relieved by nausea medication at home. Patient has drank 1 can of tomato juice. Patient teaching pertaining to hydration and oral intake. Understanding verbalized.   Hydration given today per provider orders. Tolerated infusion without adverse affects. Per R. Leron Croak PA patient to return on Thursday and Friday for IV fluids due to patient is not drinking or eating. Vital signs stable. No complaints at this time. Discharged from clinic ambulatory in stable condition. Alert and oriented x 3. F/U with [facility] as scheduled.

## 2022-02-03 ENCOUNTER — Ambulatory Visit (HOSPITAL_BASED_OUTPATIENT_CLINIC_OR_DEPARTMENT_OTHER)
Admission: RE | Admit: 2022-02-03 | Discharge: 2022-02-03 | Disposition: A | Discharge status: Home / Self Care | Payer: Managed Care, Other (non HMO) | Source: Ambulatory Visit | Attending: Hematology | Admitting: Hematology

## 2022-02-03 ENCOUNTER — Encounter (HOSPITAL_BASED_OUTPATIENT_CLINIC_OR_DEPARTMENT_OTHER): Payer: Self-pay

## 2022-02-03 DIAGNOSIS — C50812 Malignant neoplasm of overlapping sites of left female breast: Secondary | ICD-10-CM | POA: Insufficient documentation

## 2022-02-03 DIAGNOSIS — Z170 Estrogen receptor positive status [ER+]: Secondary | ICD-10-CM | POA: Diagnosis present

## 2022-02-03 IMAGING — CT CT CHEST W/ CM
2 of 4 series · 14 of 36 positions shown, 17 images · IV contrast (APPLIED)
Comparison: CT abdomen pelvis, [DATE], PET-CT, [DATE]

CLINICAL DATA: Metastatic breast cancer, pulmonary nodules recently
diagnosed left breast cancer, ongoing chemotherapy * Tracking Code:
BO *

EXAM:
CT CHEST WITH CONTRAST
TECHNIQUE: Multidetector CT imaging of the chest was performed during
intravenous contrast administration.

[Series 2: routine chest with · axial · 0.59mm/px · z∈[+1237,+1471]mm · 11 of 139 slices shown, 14 images]
[im 11/139  mediastinal]
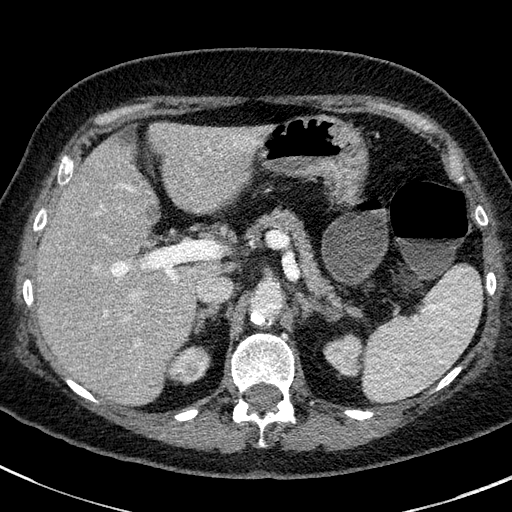
[im 11/139  lung]
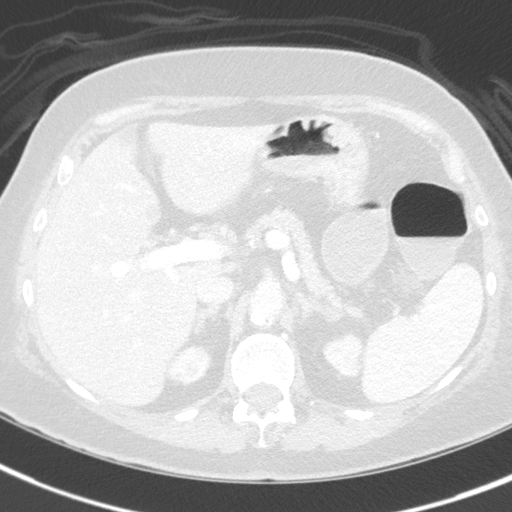
[im 22/139  lung]
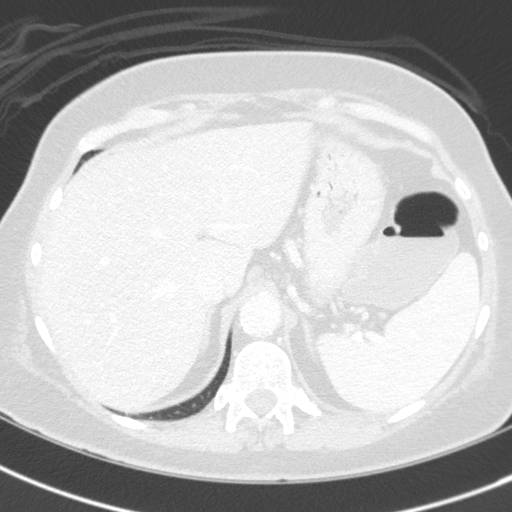
[im 32/139  lung]
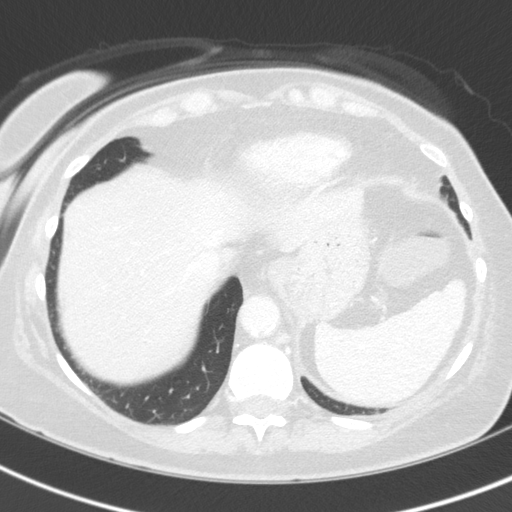
[im 43/139  lung]
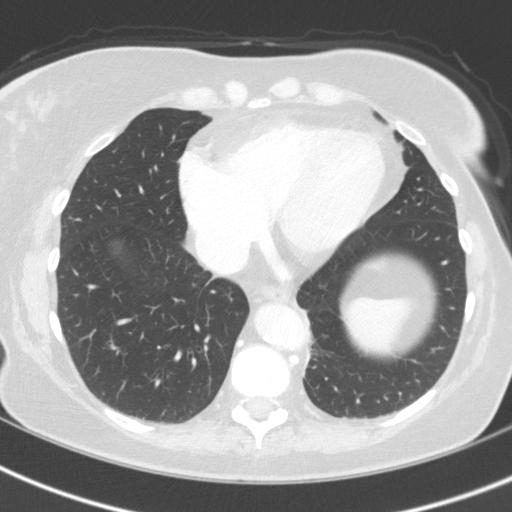
[im 54/139  mediastinal]
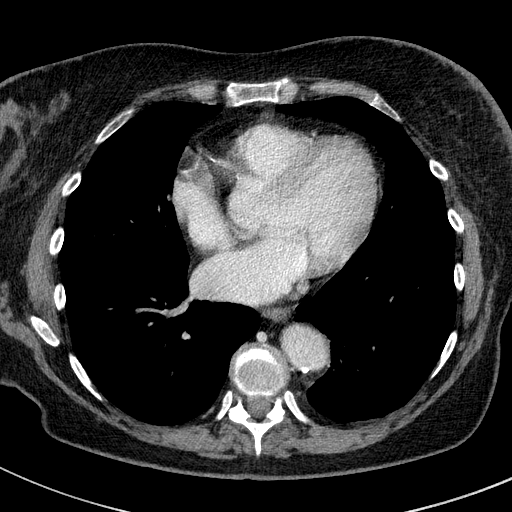
[im 54/139  lung]
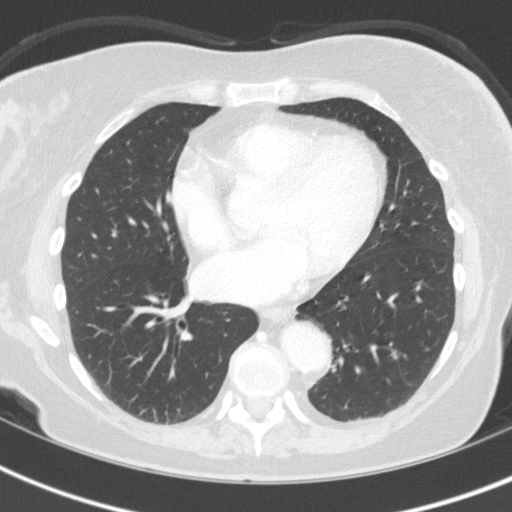
[im 75/139  lung]
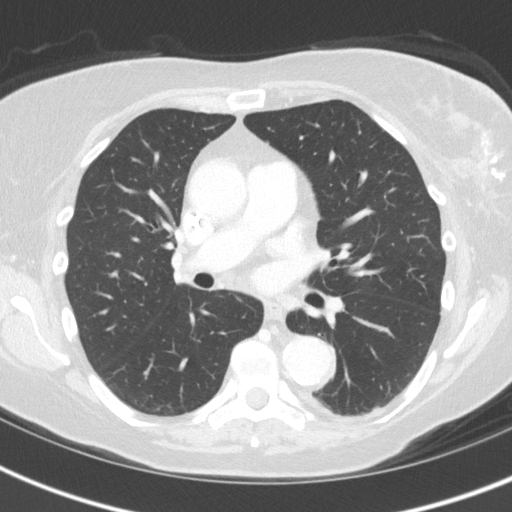
[im 85/139  lung]
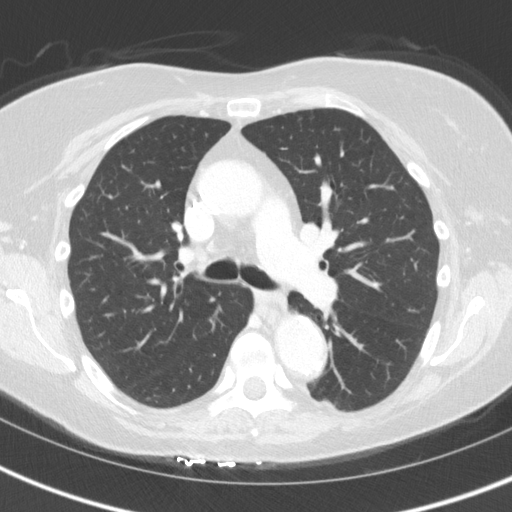
[im 96/139  lung]
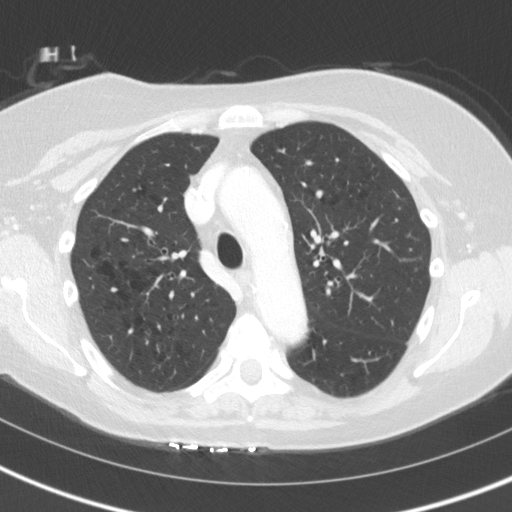
[im 107/139  mediastinal]
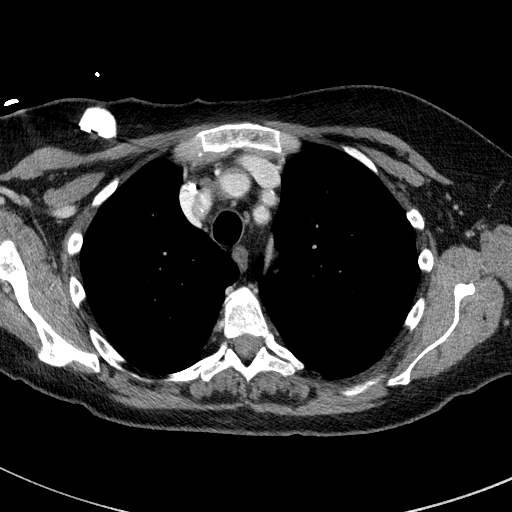
[im 107/139  lung]
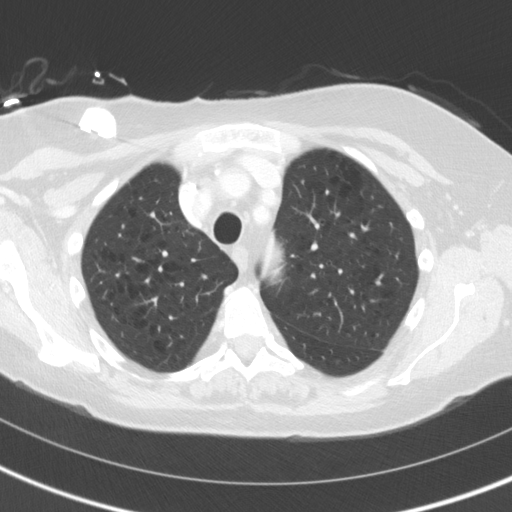
[im 117/139  lung]
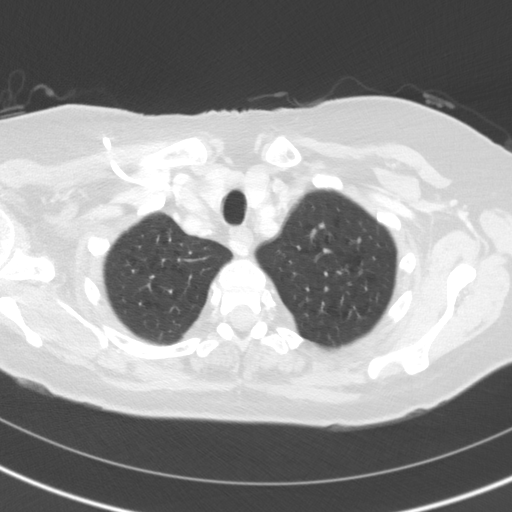
[im 128/139  lung]
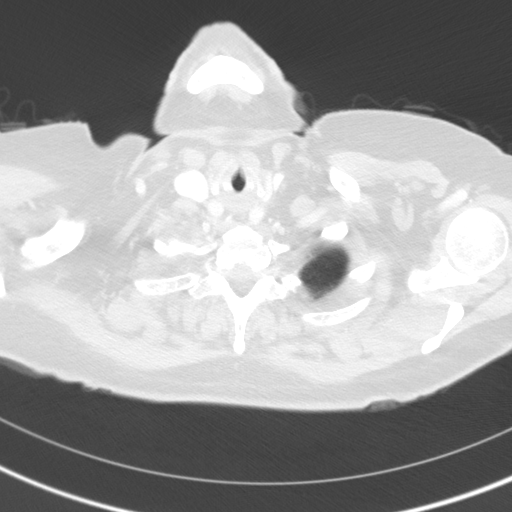

[Series 5: coronal · coronal · 0.60mm/px · 3 of 127 slices shown]
[im 26/127  lung]
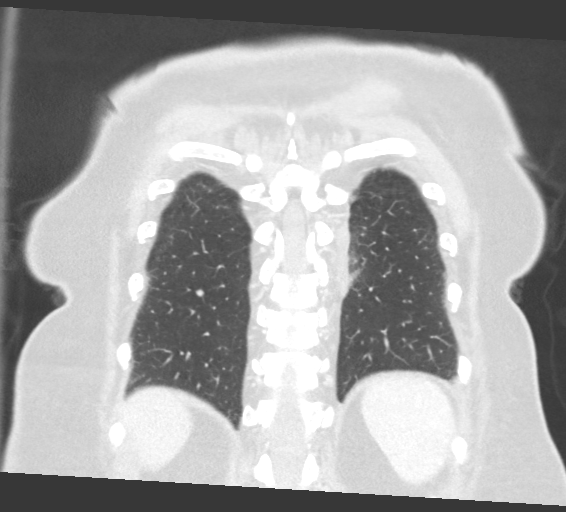
[im 51/127  lung]
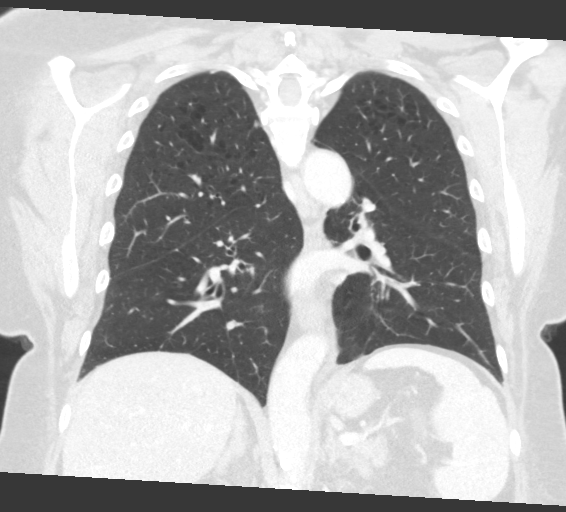
[im 76/127  lung]
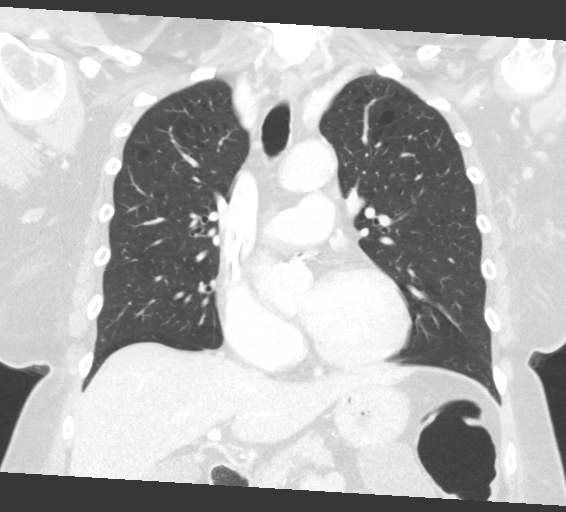

[14 of 36 positions shown; findings below may reference images not displayed]

RADIATION DOSE REDUCTION: This exam was performed according to the
departmental dose-optimization program which includes automated
exposure control, adjustment of the mA and/or kV according to
patient size and/or use of iterative reconstruction technique.

CONTRAST:  60mL OMNIPAQUE IOHEXOL 300 MG/ML  SOLN
FINDINGS: Cardiovascular: Right chest port catheter. Aortic atherosclerosis.
Normal heart size. Three-vessel coronary artery calcifications. No
pericardial effusion.

Mediastinum/Nodes: Interval decrease in size of multiple previously
enlarged and hypermetabolic left axillary and subpectoral lymph
nodes, index subpectoral node measuring no greater than 0.6 cm,
previously 1.8 x 1.2 cm (series 2, image 32). No persistently
enlarged mediastinal, hilar, or axillary lymph nodes. Thyroid gland,
trachea, and esophagus demonstrate no significant findings.

Lungs/Pleura: Moderate centrilobular emphysema. Interval decrease in
size of multiple small bilateral pulmonary nodules, the largest of
which were previously hypermetabolic. A nodule of the peripheral
left upper lobe measures 0.3 cm, previously 0.7 cm (series 4, image
43), and a nodule of the anterior left upper lobe measures 0.5 cm,
previously 0.7 cm (series 4, image 36). Nodule of the right lower
lobe is difficult to appreciate but remnant measures no greater than
0.3 cm (series 4, image 98). Additional rounded nodule of the
dependent right lower lobe is unchanged measuring 0.5 cm (series 4,
image 83). Small triangular nodular lymph node of the medial right
upper lobe is unchanged measuring 0.4 cm (series 4, image 31). No
pleural effusion or pneumothorax.

Upper Abdomen: No acute abnormality.

Musculoskeletal: Partially imaged mass of the left breast appears
diminished in size with overlying skin thickening (series 2, image
70). No suspicious osseous lesions identified.
IMPRESSION: 1. Interval decrease in size of multiple previously enlarged and
hypermetabolic left axillary and subpectoral lymph nodes.
2. Partially imaged left breast mass with overlying skin thickening
appears diminished in size.
3. Interval decrease in size of multiple small bilateral pulmonary
nodules, the largest of which were previously hypermetabolic.
4. Constellation of findings is consistent with treatment response
of primary breast malignancy with nodal and pulmonary metastatic
disease.
5. Emphysema.
6. Coronary artery disease.

Aortic Atherosclerosis ([OP]-[OP]) and Emphysema ([OP]-[OP]).

## 2022-02-03 MED ORDER — SODIUM CHLORIDE 0.9% FLUSH
10.0000 mL | INTRAVENOUS | Status: DC | PRN
  Filled 2022-02-03: qty 10

## 2022-02-03 MED ORDER — HEPARIN SOD (PORK) LOCK FLUSH 100 UNIT/ML IV SOLN
500.0000 [IU] | Freq: Once | INTRAVENOUS | Status: AC
  Administered 2022-02-03: 500 [IU] via INTRAVENOUS

## 2022-02-03 MED ORDER — IOHEXOL 300 MG/ML  SOLN
100.0000 mL | Freq: Once | INTRAMUSCULAR | Status: AC | PRN
  Administered 2022-02-03: 60 mL via INTRAVENOUS

## 2022-02-04 ENCOUNTER — Inpatient Hospital Stay (HOSPITAL_COMMUNITY): Payer: Managed Care, Other (non HMO)

## 2022-02-04 DIAGNOSIS — Z170 Estrogen receptor positive status [ER+]: Secondary | ICD-10-CM

## 2022-02-04 DIAGNOSIS — C50812 Malignant neoplasm of overlapping sites of left female breast: Secondary | ICD-10-CM | POA: Diagnosis not present

## 2022-02-04 LAB — COMPREHENSIVE METABOLIC PANEL
ALT: 14 U/L (ref 0–44)
AST: 14 U/L — ABNORMAL LOW (ref 15–41)
Albumin: 3.3 g/dL — ABNORMAL LOW (ref 3.5–5.0)
Alkaline Phosphatase: 153 U/L — ABNORMAL HIGH (ref 38–126)
Anion gap: 8 (ref 5–15)
BUN: 6 mg/dL — ABNORMAL LOW (ref 8–23)
CO2: 19 mmol/L — ABNORMAL LOW (ref 22–32)
Calcium: 9.1 mg/dL (ref 8.9–10.3)
Chloride: 110 mmol/L (ref 98–111)
Creatinine, Ser: 1.25 mg/dL — ABNORMAL HIGH (ref 0.44–1.00)
GFR, Estimated: 48 mL/min — ABNORMAL LOW (ref >60)
Glucose, Bld: 119 mg/dL — ABNORMAL HIGH (ref 70–99)
Potassium: 3 mmol/L — ABNORMAL LOW (ref 3.5–5.1)
Sodium: 137 mmol/L (ref 135–145)
Total Bilirubin: 0.9 mg/dL (ref 0.3–1.2)
Total Protein: 6.3 g/dL — ABNORMAL LOW (ref 6.5–8.1)

## 2022-02-04 MED ORDER — HEPARIN SOD (PORK) LOCK FLUSH 100 UNIT/ML IV SOLN
500.0000 [IU] | Freq: Once | INTRAVENOUS | Status: AC
  Administered 2022-02-04: 500 [IU] via INTRAVENOUS

## 2022-02-04 MED ORDER — SODIUM CHLORIDE 0.9 % IV SOLN: Freq: Once | INTRAVENOUS | Status: AC

## 2022-02-04 MED ORDER — SODIUM CHLORIDE 0.9% FLUSH
10.0000 mL | INTRAVENOUS | Status: DC | PRN
  Administered 2022-02-04: 10 mL via INTRAVENOUS

## 2022-02-04 MED ORDER — POTASSIUM CHLORIDE CRYS ER 20 MEQ PO TBCR
40.0000 meq | EXTENDED_RELEASE_TABLET | Freq: Once | ORAL | Status: AC
  Administered 2022-02-04: 40 meq via ORAL
  Filled 2022-02-04: qty 2

## 2022-02-04 NOTE — Progress Notes (Signed)
Pt presents today for normal saline over 2 hours per provider's order. Pt's potassium is 3.0 today per Dr.K's standing order, pt will receive 40 mEq potassium chloride p.o x 1 dose. Vital signs stable and pt voiced no new complaints at this time.  Normal saline over 2 hours and 40 mEq potassium chloride p.o x 1 dose given today per MD orders. Tolerated infusion without adverse affects. Vital signs stable. No complaints at this time. Discharged from clinic ambulatory in stable condition. Alert and oriented x 3. F/U with [facility] as scheduled.

## 2022-02-04 NOTE — Patient Instructions (Signed)
[place]  Discharge Instructions: Thank you for choosing [place] to provide your oncology and hematology care.  If you have a lab appointment with the [place], please come in thru the Main Entrance and check in at the main information desk.  Wear comfortable clothing and clothing appropriate for easy access to any Portacath or PICC line.   We strive to give you quality time with your provider. You may need to reschedule your appointment if you arrive late (15 or more minutes).  Arriving late affects you and other patients whose appointments are after yours.  Also, if you miss three or more appointments without notifying the office, you may be dismissed from the clinic at the provider's discretion.      For prescription refill requests, have your pharmacy contact our office and allow 72 hours for refills to be completed.    Today you received NS over two hours and potassium chloride 40 mEq p.o x 1 dose.     BELOW ARE SYMPTOMS THAT SHOULD BE REPORTED IMMEDIATELY: *FEVER GREATER THAN 100.4 F (38 C) OR HIGHER *CHILLS OR SWEATING *NAUSEA AND VOMITING THAT IS NOT CONTROLLED WITH YOUR NAUSEA MEDICATION *UNUSUAL SHORTNESS OF BREATH *UNUSUAL BRUISING OR BLEEDING *URINARY PROBLEMS (pain or burning when urinating, or frequent urination) *BOWEL PROBLEMS (unusual diarrhea, constipation, pain near the anus) TENDERNESS IN MOUTH AND THROAT WITH OR WITHOUT PRESENCE OF ULCERS (sore throat, sores in mouth, or a toothache) UNUSUAL RASH, SWELLING OR PAIN  UNUSUAL VAGINAL DISCHARGE OR ITCHING   Items with * indicate a potential emergency and should be followed up as soon as possible or go to the Emergency Department if any problems should occur.  Please show the CHEMOTHERAPY ALERT CARD or IMMUNOTHERAPY ALERT CARD at check-in to the Emergency Department and triage nurse.  Should you have questions after your visit or need to cancel or reschedule your appointment,  please contact [facility] [phone]  and follow the prompts.  Office hours are 8:00 a.m. to 4:30 p.m. Monday - Friday. Please note that voicemails left after 4:00 p.m. may not be returned until the following business day.  We are closed weekends and major holidays. You have access to a nurse at all times for urgent questions. Please call the main number to the clinic [phone] and follow the prompts.  For any non-urgent questions, you may also contact your provider using MyChart. We now offer e-Visits for anyone 45 and older to request care online for non-urgent symptoms. For details visit [email or web address].   Also download the MyChart app! Go to the app store, search "MyChart", open the app, select [place], and log in with your MyChart username and password.  Masks are optional in the cancer centers. If you would like for your care team to wear a mask while they are taking care of you, please let them know. For doctor visits, patients may have with them one support person who is at least 65 years old. At this time, visitors are not allowed in the infusion area.

## 2022-02-05 ENCOUNTER — Inpatient Hospital Stay (HOSPITAL_COMMUNITY): Payer: Managed Care, Other (non HMO)

## 2022-02-05 DIAGNOSIS — N179 Acute kidney failure, unspecified: Secondary | ICD-10-CM

## 2022-02-05 DIAGNOSIS — T451X5A Adverse effect of antineoplastic and immunosuppressive drugs, initial encounter: Secondary | ICD-10-CM

## 2022-02-05 DIAGNOSIS — C50919 Malignant neoplasm of unspecified site of unspecified female breast: Secondary | ICD-10-CM

## 2022-02-05 DIAGNOSIS — E876 Hypokalemia: Secondary | ICD-10-CM

## 2022-02-05 DIAGNOSIS — Z170 Estrogen receptor positive status [ER+]: Secondary | ICD-10-CM

## 2022-02-05 DIAGNOSIS — E860 Dehydration: Secondary | ICD-10-CM

## 2022-02-05 DIAGNOSIS — C50812 Malignant neoplasm of overlapping sites of left female breast: Secondary | ICD-10-CM | POA: Diagnosis not present

## 2022-02-05 LAB — BASIC METABOLIC PANEL
Anion gap: 8 (ref 5–15)
BUN: 6 mg/dL — ABNORMAL LOW (ref 8–23)
CO2: 20 mmol/L — ABNORMAL LOW (ref 22–32)
Calcium: 9 mg/dL (ref 8.9–10.3)
Chloride: 110 mmol/L (ref 98–111)
Creatinine, Ser: 1.32 mg/dL — ABNORMAL HIGH (ref 0.44–1.00)
GFR, Estimated: 45 mL/min — ABNORMAL LOW (ref >60)
Glucose, Bld: 125 mg/dL — ABNORMAL HIGH (ref 70–99)
Potassium: 2.9 mmol/L — ABNORMAL LOW (ref 3.5–5.1)
Sodium: 138 mmol/L (ref 135–145)

## 2022-02-05 LAB — MAGNESIUM: Magnesium: 1.5 mg/dL — ABNORMAL LOW (ref 1.7–2.4)

## 2022-02-05 MED ORDER — SODIUM CHLORIDE 0.9% FLUSH
10.0000 mL | Freq: Once | INTRAVENOUS | Status: AC
  Administered 2022-02-05: 10 mL via INTRAVENOUS

## 2022-02-05 MED ORDER — POTASSIUM CHLORIDE CRYS ER 20 MEQ PO TBCR
40.0000 meq | EXTENDED_RELEASE_TABLET | Freq: Once | ORAL | Status: AC
  Administered 2022-02-05: 40 meq via ORAL
  Filled 2022-02-05: qty 2

## 2022-02-05 MED ORDER — SODIUM CHLORIDE 0.9 % IV SOLN: Freq: Once | INTRAVENOUS | Status: AC

## 2022-02-05 MED ORDER — HEPARIN SOD (PORK) LOCK FLUSH 100 UNIT/ML IV SOLN
500.0000 [IU] | Freq: Once | INTRAVENOUS | Status: AC
  Administered 2022-02-05: 500 [IU] via INTRAVENOUS

## 2022-02-05 NOTE — Progress Notes (Signed)
Patients port flushed without difficulty.  Good blood return noted with no bruising or swelling noted at site. Stable during blood draw.  Patient remains accessed for fluids

## 2022-02-05 NOTE — Progress Notes (Signed)
Patient presents today for hydration per providers order.  Vital signs WNL.  Patient has no new complaints at this time.  Potassium noted to be 2.9,  patient given 53mq of PO potassium per standing orders.  Hydration given today per MD orders.  Stable during infusion without adverse affects.  Vital signs stable.  No complaints at this time.  Discharge from clinic ambulatory in stable condition.  Alert and oriented X 3.  [facility]

## 2022-02-05 NOTE — Patient Instructions (Signed)
[place]  Discharge Instructions: Thank you for choosing [place] to provide your oncology and hematology care.  If you have a lab appointment with the [place], please come in thru the Main Entrance and check in at the main information desk.  Wear comfortable clothing and clothing appropriate for easy access to any Portacath or PICC line.   We strive to give you quality time with your provider. You may need to reschedule your appointment if you arrive late (15 or more minutes).  Arriving late affects you and other patients whose appointments are after yours.  Also, if you miss three or more appointments without notifying the office, you may be dismissed from the clinic at the provider's discretion.      For prescription refill requests, have your pharmacy contact our office and allow 72 hours for refills to be completed.    Today you received the following chemotherapy and/or immunotherapy agents Hydration and 40 mEq of Potassium      To help prevent nausea and vomiting after your treatment, we encourage you to take your nausea medication as directed.  BELOW ARE SYMPTOMS THAT SHOULD BE REPORTED IMMEDIATELY: *FEVER GREATER THAN 100.4 F (38 C) OR HIGHER *CHILLS OR SWEATING *NAUSEA AND VOMITING THAT IS NOT CONTROLLED WITH YOUR NAUSEA MEDICATION *UNUSUAL SHORTNESS OF BREATH *UNUSUAL BRUISING OR BLEEDING *URINARY PROBLEMS (pain or burning when urinating, or frequent urination) *BOWEL PROBLEMS (unusual diarrhea, constipation, pain near the anus) TENDERNESS IN MOUTH AND THROAT WITH OR WITHOUT PRESENCE OF ULCERS (sore throat, sores in mouth, or a toothache) UNUSUAL RASH, SWELLING OR PAIN  UNUSUAL VAGINAL DISCHARGE OR ITCHING   Items with * indicate a potential emergency and should be followed up as soon as possible or go to the Emergency Department if any problems should occur.  Please show the CHEMOTHERAPY ALERT CARD or IMMUNOTHERAPY ALERT CARD at check-in  to the Emergency Department and triage nurse.  Should you have questions after your visit or need to cancel or reschedule your appointment, please contact [facility] [phone]  and follow the prompts.  Office hours are 8:00 a.m. to 4:30 p.m. Monday - Friday. Please note that voicemails left after 4:00 p.m. may not be returned until the following business day.  We are closed weekends and major holidays. You have access to a nurse at all times for urgent questions. Please call the main number to the clinic [phone] and follow the prompts.  For any non-urgent questions, you may also contact your provider using MyChart. We now offer e-Visits for anyone 65 and older to request care online for non-urgent symptoms. For details visit [email or web address].   Also download the MyChart app! Go to the app store, search "MyChart", open the app, select [place], and log in with your MyChart username and password.  Masks are optional in the cancer centers. If you would like for your care team to wear a mask while they are taking care of you, please let them know. For doctor visits, patients may have with them one support person who is at least 65 years old. At this time, visitors are not allowed in the infusion area.

## 2022-02-08 ENCOUNTER — Inpatient Hospital Stay (HOSPITAL_COMMUNITY): Payer: Managed Care, Other (non HMO)

## 2022-02-08 VITALS — BP 139/78 | HR 69 | Temp 98.0°F | Resp 18

## 2022-02-08 DIAGNOSIS — C50812 Malignant neoplasm of overlapping sites of left female breast: Secondary | ICD-10-CM | POA: Diagnosis not present

## 2022-02-08 MED ORDER — POTASSIUM CHLORIDE IN NACL 20-0.9 MEQ/L-% IV SOLN
Freq: Once | INTRAVENOUS | Status: AC
  Filled 2022-02-08: qty 1000

## 2022-02-08 MED ORDER — HEPARIN SOD (PORK) LOCK FLUSH 100 UNIT/ML IV SOLN
500.0000 [IU] | Freq: Once | INTRAVENOUS | Status: AC
  Administered 2022-02-08: 500 [IU] via INTRAVENOUS

## 2022-02-08 MED ORDER — MAGNESIUM SULFATE 2 GM/50ML IV SOLN
2.0000 g | Freq: Once | INTRAVENOUS | Status: AC
  Administered 2022-02-08: 2 g via INTRAVENOUS
  Filled 2022-02-08: qty 50

## 2022-02-08 MED ORDER — SODIUM CHLORIDE 0.9% FLUSH
10.0000 mL | Freq: Once | INTRAVENOUS | Status: AC
  Administered 2022-02-08: 10 mL via INTRAVENOUS

## 2022-02-08 NOTE — Patient Instructions (Signed)
[facility]  Discharge Instructions: Thank you for choosing [facility] to provide your oncology and hematology care.  If you have a lab appointment with the Cancer Center, please come in thru the Main Entrance and check in at the main information desk.  Wear comfortable clothing and clothing appropriate for easy access to any Portacath or PICC line.   We strive to give you quality time with your provider. You may need to reschedule your appointment if you arrive late (15 or more minutes).  Arriving late affects you and other patients whose appointments are after yours.  Also, if you miss three or more appointments without notifying the office, you may be dismissed from the clinic at the provider's discretion.      For prescription refill requests, have your pharmacy contact our office and allow 72 hours for refills to be completed.         To help prevent nausea and vomiting after your treatment, we encourage you to take your nausea medication as directed.  BELOW ARE SYMPTOMS THAT SHOULD BE REPORTED IMMEDIATELY: *FEVER GREATER THAN 100.4 F (38 C) OR HIGHER *CHILLS OR SWEATING *NAUSEA AND VOMITING THAT IS NOT CONTROLLED WITH YOUR NAUSEA MEDICATION *UNUSUAL SHORTNESS OF BREATH *UNUSUAL BRUISING OR BLEEDING *URINARY PROBLEMS (pain or burning when urinating, or frequent urination) *BOWEL PROBLEMS (unusual diarrhea, constipation, pain near the anus) TENDERNESS IN MOUTH AND THROAT WITH OR WITHOUT PRESENCE OF ULCERS (sore throat, sores in mouth, or a toothache) UNUSUAL RASH, SWELLING OR PAIN  UNUSUAL VAGINAL DISCHARGE OR ITCHING   Items with * indicate a potential emergency and should be followed up as soon as possible or go to the Emergency Department if any problems should occur.  Please show the CHEMOTHERAPY ALERT CARD or IMMUNOTHERAPY ALERT CARD at check-in to the Emergency Department and triage nurse.  Should you have questions after your visit or need to  cancel or reschedule your appointment, please contact [facility] [phone]  and follow the prompts.  Office hours are 8:00 a.m. to 4:30 p.m. Monday - Friday. Please note that voicemails left after 4:00 p.m. may not be returned until the following business day.  We are closed weekends and major holidays. You have access to a nurse at all times for urgent questions. Please call the main number to the clinic [phone] and follow the prompts.  For any non-urgent questions, you may also contact your provider using MyChart. We now offer e-Visits for anyone 18 and older to request care online for non-urgent symptoms. For details visit [email or web address].   Also download the MyChart app! Go to the app store, search "MyChart", open the app, select [place], and log in with your MyChart username and password.  Masks are optional in the cancer centers. If you would like for your care team to wear a mask while they are taking care of you, please let them know. For doctor visits, patients may have with them one support person who is at least 65 years old. At this time, visitors are not allowed in the infusion area.  

## 2022-02-08 NOTE — Progress Notes (Signed)
Patient presents today for possible IVF.  Patient is in stable condition with no new complaints voiced.  Vital signs are stable.  No labs drawn today.  Tarri Abernethy, PA-C notified that patient has had diarrhea, weakness, and decreased appetite.  Nausea has improved.  We will proceed with 1 L of house fluids per RP, PA-C.    Patient tolerated fluids well with no complaints voiced.  Patient left ambulatory in stable condition.  Vital signs stable at discharge.  Follow up as scheduled.

## 2022-02-10 ENCOUNTER — Inpatient Hospital Stay (HOSPITAL_COMMUNITY): Payer: Managed Care, Other (non HMO) | Admitting: Hematology

## 2022-02-10 ENCOUNTER — Other Ambulatory Visit (HOSPITAL_COMMUNITY): Payer: Self-pay | Admitting: *Deleted

## 2022-02-10 ENCOUNTER — Inpatient Hospital Stay (HOSPITAL_COMMUNITY): Payer: Managed Care, Other (non HMO)

## 2022-02-10 DIAGNOSIS — Z170 Estrogen receptor positive status [ER+]: Secondary | ICD-10-CM

## 2022-02-10 DIAGNOSIS — C50812 Malignant neoplasm of overlapping sites of left female breast: Secondary | ICD-10-CM

## 2022-02-10 DIAGNOSIS — C50919 Malignant neoplasm of unspecified site of unspecified female breast: Secondary | ICD-10-CM

## 2022-02-10 DIAGNOSIS — T451X5A Adverse effect of antineoplastic and immunosuppressive drugs, initial encounter: Secondary | ICD-10-CM

## 2022-02-10 DIAGNOSIS — Z95828 Presence of other vascular implants and grafts: Secondary | ICD-10-CM | POA: Diagnosis not present

## 2022-02-10 DIAGNOSIS — R112 Nausea with vomiting, unspecified: Secondary | ICD-10-CM | POA: Diagnosis not present

## 2022-02-10 LAB — COMPREHENSIVE METABOLIC PANEL
ALT: 11 U/L (ref 0–44)
AST: 15 U/L (ref 15–41)
Albumin: 3.2 g/dL — ABNORMAL LOW (ref 3.5–5.0)
Alkaline Phosphatase: 126 U/L (ref 38–126)
Anion gap: 8 (ref 5–15)
BUN: 11 mg/dL (ref 8–23)
CO2: 21 mmol/L — ABNORMAL LOW (ref 22–32)
Calcium: 9.3 mg/dL (ref 8.9–10.3)
Chloride: 108 mmol/L (ref 98–111)
Creatinine, Ser: 1.13 mg/dL — ABNORMAL HIGH (ref 0.44–1.00)
GFR, Estimated: 54 mL/min — ABNORMAL LOW (ref >60)
Glucose, Bld: 118 mg/dL — ABNORMAL HIGH (ref 70–99)
Potassium: 2.9 mmol/L — ABNORMAL LOW (ref 3.5–5.1)
Sodium: 137 mmol/L (ref 135–145)
Total Bilirubin: 0.7 mg/dL (ref 0.3–1.2)
Total Protein: 6.5 g/dL (ref 6.5–8.1)

## 2022-02-10 LAB — CBC WITH DIFFERENTIAL/PLATELET
Abs Immature Granulocytes: 0.05 10*3/uL (ref 0.00–0.07)
Basophils Absolute: 0 10*3/uL (ref 0.0–0.1)
Basophils Relative: 0 %
Eosinophils Absolute: 0 10*3/uL (ref 0.0–0.5)
Eosinophils Relative: 0 %
HCT: 29.1 % — ABNORMAL LOW (ref 36.0–46.0)
Hemoglobin: 9.2 g/dL — ABNORMAL LOW (ref 12.0–15.0)
Immature Granulocytes: 1 %
Lymphocytes Relative: 8 %
Lymphs Abs: 0.6 10*3/uL — ABNORMAL LOW (ref 0.7–4.0)
MCH: 33 pg (ref 26.0–34.0)
MCHC: 31.6 g/dL (ref 30.0–36.0)
MCV: 104.3 fL — ABNORMAL HIGH (ref 80.0–100.0)
Monocytes Absolute: 0.5 10*3/uL (ref 0.1–1.0)
Monocytes Relative: 6 %
Neutro Abs: 6.8 10*3/uL (ref 1.7–7.7)
Neutrophils Relative %: 85 %
Platelets: 154 10*3/uL (ref 150–400)
RBC: 2.79 MIL/uL — ABNORMAL LOW (ref 3.87–5.11)
RDW: 20.6 % — ABNORMAL HIGH (ref 11.5–15.5)
WBC: 7.9 10*3/uL (ref 4.0–10.5)
nRBC: 0 % (ref 0.0–0.2)

## 2022-02-10 LAB — MAGNESIUM: Magnesium: 1.6 mg/dL — ABNORMAL LOW (ref 1.7–2.4)

## 2022-02-10 MED ORDER — DRONABINOL 5 MG PO CAPS: 5.0000 mg | ORAL_CAPSULE | Freq: Two times a day (BID) | ORAL | 3 refills | Status: DC

## 2022-02-10 MED ORDER — POTASSIUM CHLORIDE IN NACL 20-0.9 MEQ/L-% IV SOLN
Freq: Once | INTRAVENOUS | Status: AC
  Filled 2022-02-10: qty 1000

## 2022-02-10 MED ORDER — PROCHLORPERAZINE MALEATE 10 MG PO TABS: 10.0000 mg | ORAL_TABLET | Freq: Four times a day (QID) | ORAL | 3 refills | Status: DC | PRN

## 2022-02-10 MED ORDER — HEPARIN SOD (PORK) LOCK FLUSH 100 UNIT/ML IV SOLN
500.0000 [IU] | Freq: Once | INTRAVENOUS | Status: AC | PRN
  Administered 2022-02-10: 500 [IU]

## 2022-02-10 MED ORDER — PROCHLORPERAZINE MALEATE 10 MG PO TABS
10.0000 mg | ORAL_TABLET | Freq: Once | ORAL | Status: AC
  Administered 2022-02-10: 10 mg via ORAL
  Filled 2022-02-10: qty 1

## 2022-02-10 MED ORDER — MAGNESIUM SULFATE 2 GM/50ML IV SOLN
2.0000 g | Freq: Once | INTRAVENOUS | Status: AC
  Filled 2022-02-10: qty 50

## 2022-02-10 MED ORDER — SODIUM CHLORIDE 0.9% FLUSH
10.0000 mL | Freq: Once | INTRAVENOUS | Status: AC | PRN
  Administered 2022-02-10: 10 mL

## 2022-02-10 MED ORDER — DIPHENOXYLATE-ATROPINE 2.5-0.025 MG PO TABS: 2.0000 | ORAL_TABLET | Freq: Four times a day (QID) | ORAL | 3 refills | Status: DC | PRN

## 2022-02-10 MED ORDER — MAGNESIUM SULFATE 2 GM/50ML IV SOLN
2.0000 g | Freq: Once | INTRAVENOUS | Status: AC
  Administered 2022-02-10: 2 g via INTRAVENOUS

## 2022-02-10 NOTE — Patient Instructions (Signed)
[place]  Discharge Instructions: Thank you for choosing [place] to provide your oncology and hematology care.  If you have a lab appointment with the [place], please come in thru the Main Entrance and check in at the main information desk.  Wear comfortable clothing and clothing appropriate for easy access to any Portacath or PICC line.   We strive to give you quality time with your provider. You may need to reschedule your appointment if you arrive late (15 or more minutes).  Arriving late affects you and other patients whose appointments are after yours.  Also, if you miss three or more appointments without notifying the office, you may be dismissed from the clinic at the provider's discretion.      For prescription refill requests, have your pharmacy contact our office and allow 72 hours for refills to be completed.    Treatment held today, hydration fluids given per orders.   To help prevent nausea and vomiting after your treatment, we encourage you to take your nausea medication as directed.  BELOW ARE SYMPTOMS THAT SHOULD BE REPORTED IMMEDIATELY: *FEVER GREATER THAN 100.4 F (38 C) OR HIGHER *CHILLS OR SWEATING *NAUSEA AND VOMITING THAT IS NOT CONTROLLED WITH YOUR NAUSEA MEDICATION *UNUSUAL SHORTNESS OF BREATH *UNUSUAL BRUISING OR BLEEDING *URINARY PROBLEMS (pain or burning when urinating, or frequent urination) *BOWEL PROBLEMS (unusual diarrhea, constipation, pain near the anus) TENDERNESS IN MOUTH AND THROAT WITH OR WITHOUT PRESENCE OF ULCERS (sore throat, sores in mouth, or a toothache) UNUSUAL RASH, SWELLING OR PAIN  UNUSUAL VAGINAL DISCHARGE OR ITCHING   Items with * indicate a potential emergency and should be followed up as soon as possible or go to the Emergency Department if any problems should occur.  Please show the CHEMOTHERAPY ALERT CARD or IMMUNOTHERAPY ALERT CARD at check-in to the Emergency Department and triage  nurse.  Should you have questions after your visit or need to cancel or reschedule your appointment, please contact [facility] [phone]  and follow the prompts.  Office hours are 8:00 a.m. to 4:30 p.m. Monday - Friday. Please note that voicemails left after 4:00 p.m. may not be returned until the following business day.  We are closed weekends and major holidays. You have access to a nurse at all times for urgent questions. Please call the main number to the clinic [phone] and follow the prompts.  For any non-urgent questions, you may also contact your provider using MyChart. We now offer e-Visits for anyone 68 and older to request care online for non-urgent symptoms. For details visit [email or web address].   Also download the MyChart app! Go to the app store, search "MyChart", open the app, select [place], and log in with your MyChart username and password.  Masks are optional in the cancer centers. If you would like for your care team to wear a mask while they are taking care of you, please let them know. For doctor visits, patients may have with them one support person who is at least 65 years old. At this time, visitors are not allowed in the infusion area.

## 2022-02-10 NOTE — Patient Instructions (Addendum)
[place] at [facility] Discharge Instructions   You were seen and examined today by Dr. Delton Coombes.  He reviewed your lab work. Your magnesium and potassium are low. We will hold your treatment today and give IV fluids with potassium and magnesium.  We will send a prescription to your pharmacy for an appetite stimulant called Marinol. You will take this twice a day. You have lost 16 lbs since your last treatment.   We will make a referral to our dietician to help with tips and tricks to help take in more calories and gain weight.   We will recheck you in 1 week and plan for possible treatment.    Thank you for choosing [place] at [facility] to provide your oncology and hematology care.  To afford each patient quality time with our provider, please arrive at least 15 minutes before your scheduled appointment time.   If you have a lab appointment with the [facility] please come in thru the Main Entrance and check in at the main information desk.  You need to re-schedule your appointment should you arrive 10 or more minutes late.  We strive to give you quality time with our providers, and arriving late affects you and other patients whose appointments are after yours.  Also, if you no show three or more times for appointments you may be dismissed from the clinic at the providers discretion.     Again, thank you for choosing [facility].  Our hope is that these requests will decrease the amount of time that you wait before being seen by our physicians.       _____________________________________________________________  Should you have questions after your visit to [facility], please contact our office at [phone] and follow the prompts.  Our office hours are 8:00 a.m. and 4:30 p.m. Monday - Friday.  Please note that voicemails left after 4:00 p.m. may not be returned until the following business day.  We are  closed weekends and major holidays.  You do have access to a nurse 24-7, just call the main number to the clinic [phone] and do not press any options, hold on the line and a nurse will answer the phone.    For prescription refill requests, have your pharmacy contact our office and allow 72 hours.    Due to Covid, you will need to wear a mask upon entering the hospital. If you do not have a mask, a mask will be given to you at the Main Entrance upon arrival. For doctor visits, patients may have 1 support person age 45 or older with them. For treatment visits, patients can not have anyone with them due to social distancing guidelines and our immunocompromised population.

## 2022-02-10 NOTE — Progress Notes (Signed)
No treatment today per MD. Will give hydration fluids per orders.    Patient tolerated it well without problems. Vitals stable and discharged home from clinic ambulatory. Follow up as scheduled.

## 2022-02-10 NOTE — Progress Notes (Signed)
[place], [place] [address]   CLINIC:  Medical Oncology/Hematology  PCP:  Traci Squibb, MD [address] [name] [address] [phone]   REASON FOR VISIT:  Follow-up for locally advanced left breast cancer  PRIOR THERAPY: none  NGS Results: not done  CURRENT THERAPY: Docetaxel + Carboplatin + Trastuzumab + Pertuzumab  (TCHP) q21d   BRIEF ONCOLOGIC HISTORY:  Oncology History  Breast cancer, left ([place])  10/15/2021 Initial Diagnosis   Breast cancer, left ([place])   11/04/2021 -  Chemotherapy   Patient is on Treatment Plan : BREAST  Docetaxel + Carboplatin + Trastuzumab + Pertuzumab  (TCHP) q21d       Genetic Testing   Negative genetic testing. No pathogenic variants identified on the [facility] Multi-Cancer+RNA panel. The report date is 11/02/2021.   The Multi-Cancer Panel + RNA offered by [facility] includes sequencing and/or deletion duplication testing of the following 84 genes: AIP, ALK, APC, ATM, AXIN2,BAP1,  BARD1, BLM, BMPR1A, BRCA1, BRCA2, BRIP1, CASR, CDC73, CDH1, CDK4, CDKN1B, CDKN1C, CDKN2A (p14ARF), CDKN2A (p16INK4a), CEBPA, CHEK2, CTNNA1, DICER1, DIS3L2, EGFR (c.2369C>T, p.Thr790Met variant only), EPCAM (Deletion/duplication testing only), FH, FLCN, GATA2, GPC3, GREM1 (Promoter region deletion/duplication testing only), HOXB13 (c.251G>A, p.Gly84Glu), HRAS, KIT, MAX, MEN1, MET, MITF (c.952G>A, p.Glu318Lys variant only), MLH1, MSH2, MSH3, MSH6, MUTYH, NBN, NF1, NF2, NTHL1, PALB2, PDGFRA, PHOX2B, PMS2, POLD1, POLE, POT1, PRKAR1A, PTCH1, PTEN, RAD50, RAD51C, RAD51D, RB1, RECQL4, RET, RUNX1, SDHAF2, SDHA (sequence changes only), SDHB, SDHC, SDHD, SMAD4, SMARCA4, SMARCB1, SMARCE1, STK11, SUFU, TERC, TERT, TMEM127, TP53, TSC1, TSC2, VHL, WRN and WT1.     CANCER STAGING:  Cancer Staging  Breast cancer, left ([place]) Staging form: Breast, AJCC 8th Edition - Clinical stage from 10/15/2021: Stage IIA (cT3, cN1, cM0, G2, ER+, PR+, HER2+) -  Unsigned   INTERVAL HISTORY:  Traci Fuller, a 66 y.o. female, returns for routine follow-up and consideration for next cycle of chemotherapy. Traci Fuller was last seen on 01/20/2022.  Due for cycle #5 of TCHP today.   Overall, she tells me she has been feeling pretty well. She reports continued fatigue. She reports she can only eat soft food such as yogurts, tomato juice, and fruits, and her appetite is poor. This week she was only able to drink 2 Ensure mixed with milk. Her taste is poor and makes it difficulty to drink. She has lost 16 lbs since 5/31, and she has lost 29 lbs since the start of treatment. She reports diarrhea occurring 2-3 times daily. She reports episodes of vomiting immediately following receiving fluids. She continues to take Gabapentin, and she denies any numbness/tingling in her hands or feet. She stopped taking magnesium as she reports they caused nausea and vomiting. For her nausea she is taking compazine and zofran which helps. She denies light-headedness. She reports sinus drainage for which she is taking Claritin and Zyrtec.   Overall, she is not ready for her next cycle of chemo today.    REVIEW OF SYSTEMS:  Review of Systems  Constitutional:  Positive for appetite change, fatigue and unexpected weight change (-16 lbs).  Respiratory:  Positive for shortness of breath.   Gastrointestinal:  Positive for diarrhea, nausea and vomiting.  Neurological:  Positive for headaches. Negative for light-headedness and numbness.  Psychiatric/Behavioral:  Positive for sleep disturbance.   All other systems reviewed and are negative.   PAST MEDICAL/SURGICAL HISTORY:  Past Medical History:  Diagnosis Date   CAD (coronary artery disease)  anterior MI 12/2017 DES to midLAD, 75% stenosis of RCA not treated   Cancer [facility])    Current smoker    Essential hypertension    Family history of bladder cancer    Family history of breast cancer    Family history of leukemia     Family history of uterine cancer    Hyperlipidemia LDL goal <70    Myocardial infarction ([place])    PONV (postoperative nausea and vomiting)    Port-A-Cath in place 10/29/2021   Past Surgical History:  Procedure Laterality Date   ABDOMINAL HYSTERECTOMY     BREAST BIOPSY Bilateral 10/16/2021   CORONARY STENT INTERVENTION N/A 01/10/2018   Procedure: CORONARY STENT INTERVENTION;  Surgeon: Jettie Booze, MD;  Location: [facility];  Service: Cardiovascular;  Laterality: N/A;   LEFT HEART CATH AND CORONARY ANGIOGRAPHY N/A 01/10/2018   Procedure: LEFT HEART CATH AND CORONARY ANGIOGRAPHY;  Surgeon: Jettie Booze, MD;  Location: [facility];  Service: Cardiovascular;  Laterality: N/A;   PORTACATH PLACEMENT Right 10/26/2021   Procedure: INSERTION PORT-A-CATH;  Surgeon: Virl Cagey, MD;  Location: [facility];  Service: General;  Laterality: Right;    SOCIAL HISTORY:  Social History   Socioeconomic History   Marital status: Married    Spouse name: Not on file   Number of children: Not on file   Years of education: Not on file   Highest education level: Not on file  Occupational History   Not on file  Tobacco Use   Smoking status: Every Day    Packs/day: 1.00    Years: 43.00    Total pack years: 43.00    Types: Cigarettes    Start date: 1976   Smokeless tobacco: Never  Vaping Use   Vaping Use: Never used  Substance and Sexual Activity   Alcohol use: Not Currently   Drug use: Never   Sexual activity: Yes  Other Topics Concern   Not on file  Social History Narrative   Not on file   Social Determinants of Health   Financial Resource Strain: Not on file  Food Insecurity: Not on file  Transportation Needs: Not on file  Physical Activity: Not on file  Stress: Not on file  Social Connections: Not on file  Intimate Partner Violence: Not on file    FAMILY HISTORY:  Family History  Problem Relation Age of Onset   Bladder Cancer Mother 34   Cervical  cancer Mother        dx 81s, hysterectomy   Hypertension Father    Cirrhosis Father    Leukemia Brother 86   Liver cancer Brother 81   Breast cancer Maternal Aunt        dx 89s   Cancer Maternal Uncle        unk type d. 15   Cancer Paternal Aunt        unk type   Breast cancer Maternal Grandmother        dx 28s   Uterine cancer Maternal Grandmother     CURRENT MEDICATIONS:  Current Outpatient Medications  Medication Sig Dispense Refill   ALPRAZolam (XANAX) 0.25 MG tablet Take 1 tablet (0.25 mg total) by mouth 2 (two) times daily as needed for anxiety. 60 tablet 2   amLODipine (NORVASC) 10 MG tablet Take 10 mg by mouth daily.     aspirin EC 81 MG EC tablet Take 1 tablet (81 mg total) by mouth daily. 90 tablet 3  CARBOPLATIN IV Inject into the vein every 21 ( twenty-one) days.     [START ON 02/11/2022] dexamethasone (DECADRON) 4 MG tablet Take 2 tablets (8 mg total) by mouth daily. Take in the mornings x7 days, starting on the first day after your next cycle of chemotherapy. 14 tablet 0   diphenoxylate-atropine (LOMOTIL) 2.5-0.025 MG tablet Take 2 tablets by mouth 4 (four) times daily as needed for diarrhea or loose stools. 60 tablet 3   DOCEtaxel (TAXOTERE IV) Inject into the vein every 21 ( twenty-one) days.     Evolocumab (REPATHA SURECLICK) 825 MG/ML SOAJ Inject 140 mg into the skin every 14 (fourteen) days. 2 mL 11   gabapentin (NEURONTIN) 300 MG capsule Take 300 mg by mouth 2 (two) times daily.     levocetirizine (XYZAL) 5 MG tablet Take 5 mg by mouth daily.     lidocaine-prilocaine (EMLA) cream Apply a small amount to port a cath site (do not rub in) and cover with plastic wrap 1 hour prior to chemotherapy appointments 30 g 3   magnesium oxide (MAG-OX) 400 (240 Mg) MG tablet Take 1 tablet (400 mg total) by mouth in the morning, at noon, and at bedtime. 90 tablet 3   metoprolol tartrate (LOPRESSOR) 25 MG tablet Take 1 tablet (25 mg total) by mouth 2 (two) times daily. Please call  and schedule overdue appointment with Dr. Gwenlyn Found for further refills . SECOND ATTEMPT! (Patient taking differently: Take 25 mg by mouth daily as needed (if blood pressure is 120/80 or higher). Please call and schedule overdue appointment with Dr. Gwenlyn Found for further refills . SECOND ATTEMPT!) 30 tablet 0   OLANZapine (ZYPREXA) 5 MG tablet Take 1 tablet (5 mg total) by mouth at bedtime for 8 days. Take at bedtime starting the evening after you have received your next cycle of chemotherapy (Wednesday 02/10/2022). 8 tablet 0   omeprazole (PRILOSEC) 20 MG capsule Take 20 mg by mouth daily.     ondansetron (ZOFRAN-ODT) 8 MG disintegrating tablet Take 1 tablet (8 mg total) by mouth every 8 (eight) hours. 30 tablet 0   pegfilgrastim-bmez (ZIEXTENZO) 6 MG/0.6ML injection Inject 6 mg into the skin every 21 ( twenty-one) days. AFTER chemotherapy.     Pertuzumab (PERJETA IV) Inject into the vein every 21 ( twenty-one) days.     prochlorperazine (COMPAZINE) 10 MG tablet Take 1 tablet (10 mg total) by mouth every 6 (six) hours as needed (Nausea or vomiting). 30 tablet 1   scopolamine (TRANSDERM-SCOP) 1 MG/3DAYS Place 1 patch (1.5 mg total) onto the skin every 3 (three) days. 10 patch 12   ticagrelor (BRILINTA) 90 MG TABS tablet Take 1 tablet (90 mg total) by mouth 2 (two) times daily. (Patient taking differently: Take 90 mg by mouth daily.) 180 tablet 3   trastuzumab-anns (KANJINTI) 150 MG SOLR injection Inject 462 mg into the vein every 21 ( twenty-one) days.     No current facility-administered medications for this visit.   Facility-Administered Medications Ordered in Other Visits  Medication Dose Route Frequency Provider Last Rate Last Admin   magnesium sulfate IVPB 2 g 50 mL  2 g Intravenous Once Derek Jack, MD        ALLERGIES:  No Known Allergies  PHYSICAL EXAM:  Performance status (ECOG): 1 - Symptomatic but completely ambulatory  There were no vitals filed for this visit. Wt Readings from  Last 3 Encounters:  02/10/22 142 lb 12.8 oz (64.8 kg)  02/02/22 148 lb 9.6 oz (67.4 kg)  01/29/22 146 lb 6.4 oz (66.4 kg)   Physical Exam Vitals reviewed.  Constitutional:      Appearance: Normal appearance.  Cardiovascular:     Rate and Rhythm: Normal rate and regular rhythm.     Pulses: Normal pulses.     Heart sounds: Normal heart sounds.  Pulmonary:     Effort: Pulmonary effort is normal.     Breath sounds: Normal breath sounds.  Musculoskeletal:     Right lower leg: No edema.     Left lower leg: No edema.  Neurological:     General: No focal deficit present.     Mental Status: She is alert and oriented to person, place, and time.  Psychiatric:        Mood and Affect: Mood normal.        Behavior: Behavior normal.     LABORATORY DATA:  I have reviewed the labs as listed.     Latest Ref Rng & Units 02/10/2022    8:43 AM 02/02/2022    8:07 AM 01/27/2022   10:22 AM  CBC  WBC 4.0 - 10.5 K/uL 7.9  10.2  11.6   Hemoglobin 12.0 - 15.0 g/dL 9.2  9.4  11.3   Hematocrit 36.0 - 46.0 % 29.1  29.7  35.2   Platelets 150 - 400 K/uL 154  142  245       Latest Ref Rng & Units 02/10/2022    8:43 AM 02/05/2022    8:50 AM 02/04/2022    8:19 AM  CMP  Glucose 70 - 99 mg/dL 118  125  119   BUN 8 - 23 mg/dL _0 Creatinine 0.44 - 1.00 mg/dL 1.13  1.32  1.25   Sodium 135 - 145 mmol/L 137  138  137   Potassium 3.5 - 5.1 mmol/L 2.9  2.9  3.0   Chloride 98 - 111 mmol/L 108  110  110   CO2 22 - 32 mmol/L _1 Calcium 8.9 - 10.3 mg/dL 9.3  9.0  9.1   Total Protein 6.5 - 8.1 g/dL 6.5   6.3   Total Bilirubin 0.3 - 1.2 mg/dL 0.7   0.9   Alkaline Phos 38 - 126 U/L 126   153   AST 15 - 41 U/L 15   14   ALT 0 - 44 U/L 11   14     DIAGNOSTIC IMAGING:  I have independently reviewed the scans and discussed with the patient. CT Chest W Contrast  Result Date: 02/04/2022 CLINICAL DATA:  Metastatic breast cancer, pulmonary nodules recently diagnosed left breast cancer, ongoing  chemotherapy * Tracking Code: BO * EXAM: CT CHEST WITH CONTRAST TECHNIQUE: Multidetector CT imaging of the chest was performed during intravenous contrast administration. RADIATION DOSE REDUCTION: This exam was performed according to the departmental dose-optimization program which includes automated exposure control, adjustment of the mA and/or kV according to patient size and/or use of iterative reconstruction technique. CONTRAST:  34m OMNIPAQUE IOHEXOL 300 MG/ML  SOLN COMPARISON:  CT abdomen pelvis, 01/05/2022, PET-CT, 10/22/2021 FINDINGS: Cardiovascular: Right chest port catheter. Aortic atherosclerosis. Normal heart size. Three-vessel coronary artery calcifications. No pericardial effusion. Mediastinum/Nodes: Interval decrease in size of multiple previously enlarged and hypermetabolic left axillary and subpectoral lymph nodes, index subpectoral node measuring no greater than 0.6 cm, previously 1.8 x 1.2 cm (series 2, image 32). No persistently enlarged mediastinal, hilar, or axillary lymph nodes. Thyroid gland, trachea, and esophagus  demonstrate no significant findings. Lungs/Pleura: Moderate centrilobular emphysema. Interval decrease in size of multiple small bilateral pulmonary nodules, the largest of which were previously hypermetabolic. A nodule of the peripheral left upper lobe measures 0.3 cm, previously 0.7 cm (series 4, image 43), and a nodule of the anterior left upper lobe measures 0.5 cm, previously 0.7 cm (series 4, image 36). Nodule of the right lower lobe is difficult to appreciate but remnant measures no greater than 0.3 cm (series 4, image 98). Additional rounded nodule of the dependent right lower lobe is unchanged measuring 0.5 cm (series 4, image 83). Small triangular nodular lymph node of the medial right upper lobe is unchanged measuring 0.4 cm (series 4, image 31). No pleural effusion or pneumothorax. Upper Abdomen: No acute abnormality. Musculoskeletal: Partially imaged mass of the left  breast appears diminished in size with overlying skin thickening (series 2, image 70). No suspicious osseous lesions identified. IMPRESSION: 1. Interval decrease in size of multiple previously enlarged and hypermetabolic left axillary and subpectoral lymph nodes. 2. Partially imaged left breast mass with overlying skin thickening appears diminished in size. 3. Interval decrease in size of multiple small bilateral pulmonary nodules, the largest of which were previously hypermetabolic. 4. Constellation of findings is consistent with treatment response of primary breast malignancy with nodal and pulmonary metastatic disease. 5. Emphysema. 6. Coronary artery disease. Aortic Atherosclerosis (ICD10-I70.0) and Emphysema (ICD10-J43.9). Electronically Signed   By: Delanna Ahmadi M.D.   On: 02/04/2022 16:26     ASSESSMENT:  Locally advanced HER2 positive left breast cancer: - Patient felt lump over the last 1 year. - She noticed lump has gotten bigger and deformed since September 2022.  She has itching over the skin of the breast. - Left breast 4:00 biopsy: Invasive mammary carcinoma, grade 2, ER 100%, PR 70%, Ki-67 25%, HER2 FISH positive - Left breast 2:00 biopsy: Invasive ductal carcinoma, grade 2, ER 100%, PR 70%, Ki-67 25%, HER2 negative - Left axillary lymph node biopsy: IDC, ER 100%, PR 50%, HER2 negative, E-cadherin positive - Bilateral breast MRI on 10/09/2021: Multiple enhancing masses with intervening non-mass enhancement involving most of the LOQ and part of the UOQ.  Abnormal enhancement spans approximately 12.4 x 6.4 x 7 cm.  Diffuse skin thickening/edema.  Multiple left axillary lymph nodes, largest node measuring approximately 2.4 cm in the lower axilla.  Indeterminate non-mass enhancement inferior to the biopsy site in the subareolar right breast measuring 1.5 x 0.9 x 0.8 cm. - Right breast biopsy UOQ on 10/01/2021: Fibroadenomatoid nodule with calcifications, fibrocystic changes including apocrine  metaplasia with no malignancy. -We have reviewed PET scan from 10/22/2021: Large left breast mass and numerous hypermetabolic left axillary and subpectoral lymph nodes.  2 hypermetabolic left upper lobe lung nodules measuring 7 mm with SUV 4.2 and 3.2 respectively.  No other hypermetabolic lung nodules.  No distant metastatic disease.  Nonspecific low-level subareolar activity in the right breast near the surgical clips and a single mildly hypermetabolic right axillary lymph node. - Echocardiogram shows EF 60 to 65%. - She has oligometastatic disease with 2 small lung nodules.  Biopsy is not feasible.  I have recommended TCHP regimen followed by restaging after 3 cycles. - Cycle 1 of TCHP on 11/04/2021.    Social/family history: - She lives at home with her husband and daughter.  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  Current active smoker, half pack per day since age 25. - Mother had cervical cancer and bladder cancer.  Maternal grandmother had ovarian cancer.  Brother died at age 52 from acute leukemia.  Another brother died of liver cancer.  Maternal half aunt had breast cancer.   PLAN:  Oligometastatic (two 7 mm lung lesions) HER2 positive left breast cancer: - After cycle 4, she has received IV fluids as her creatinine has gone up to 2. - She has lost 16 pounds in the last 3 weeks. - Cycle 4 was dose reduced with carboplatin 400 mg and docetaxel 60 mg/m2. - Reviewed CT chest with contrast (02/03/2022): Left upper lobe lung nodule measures 0.3 cm, previously 0.7 cm.  Another left upper lobe lung nodule measures 0.5 cm, previously 0.7 cm. - Subpectoral lymph node has decreased to 0.6 cm from 1.8 x 1.2 cm.  Interval decrease in the breast mass.  No new areas seen. - She has gotten good response but at the cost of significant weight loss. - I will hold her chemotherapy today.  We will reevaluate her in 1 week. - If there is no improvement in the weight, will consider left  mastectomy and lymph node biopsy.  We may complete last 2 cycles after surgery.  We will also consider SBRT of the left lung lesions.  2.  Right facial shingles/postherpetic right hemifacial pain: - Continue gabapentin 300 mg 3 times daily.  3.  Hypomagnesemia: - She is not taking magnesium as it causes severe nausea. - Magnesium today is 1.6.  She will receive 2 g IV magnesium.  She reported she gets nauseous after IV fluids.  We will give her Compazine with fluids.  4.  Anxiety: - Continue Xanax 0.25 mg at bedtime daily.  5.  Hypertension: - Norvasc is on hold.  Blood pressure today is 115/79.  6.  Weight loss: - She has lost 16 pounds in the last 3 weeks.  She has lost a total of 28 pounds since cycle 1. - She met with our dietitian and started drinking 2 cans of Ensure with milk in the last 2 3 days. - I have recommended her to drink 2 cans of Ensure with milk daily along with solid foods.  Reevaluate her weight next week. - I will start her on Marinol 5 mg twice daily.  We will also make a follow-up visit with our dietitian.     Orders placed this encounter:  No orders of the defined types were placed in this encounter.    Derek Jack, MD [place] [phone]   I, Thana Ates, am acting as a scribe for Dr. Derek Jack.  I, Derek Jack MD, have reviewed the above documentation for accuracy and completeness, and I agree with the above.

## 2022-02-12 ENCOUNTER — Inpatient Hospital Stay (HOSPITAL_COMMUNITY): Payer: Managed Care, Other (non HMO)

## 2022-02-12 VITALS — BP 140/79 | HR 93 | Temp 98.0°F | Resp 20

## 2022-02-12 DIAGNOSIS — C50812 Malignant neoplasm of overlapping sites of left female breast: Secondary | ICD-10-CM | POA: Diagnosis not present

## 2022-02-12 DIAGNOSIS — R110 Nausea: Secondary | ICD-10-CM

## 2022-02-12 MED ORDER — SODIUM CHLORIDE 0.9% FLUSH
10.0000 mL | INTRAVENOUS | Status: DC | PRN
  Administered 2022-02-12: 10 mL via INTRAVENOUS

## 2022-02-12 MED ORDER — HEPARIN SOD (PORK) LOCK FLUSH 100 UNIT/ML IV SOLN
500.0000 [IU] | Freq: Once | INTRAVENOUS | Status: AC
  Administered 2022-02-12: 500 [IU] via INTRAVENOUS

## 2022-02-12 MED ORDER — MAGNESIUM SULFATE 2 GM/50ML IV SOLN
2.0000 g | Freq: Once | INTRAVENOUS | Status: AC
  Administered 2022-02-12: 2 g via INTRAVENOUS
  Filled 2022-02-12: qty 50

## 2022-02-12 MED ORDER — PROCHLORPERAZINE MALEATE 10 MG PO TABS
10.0000 mg | ORAL_TABLET | Freq: Once | ORAL | Status: AC
  Administered 2022-02-12: 10 mg via ORAL
  Filled 2022-02-12: qty 1

## 2022-02-12 MED ORDER — POTASSIUM CHLORIDE IN NACL 20-0.9 MEQ/L-% IV SOLN
Freq: Once | INTRAVENOUS | Status: AC
  Filled 2022-02-12: qty 1000

## 2022-02-15 ENCOUNTER — Inpatient Hospital Stay (HOSPITAL_COMMUNITY): Payer: Managed Care, Other (non HMO)

## 2022-02-15 ENCOUNTER — Inpatient Hospital Stay (HOSPITAL_COMMUNITY): Payer: Managed Care, Other (non HMO) | Admitting: Physician Assistant

## 2022-02-17 ENCOUNTER — Inpatient Hospital Stay (HOSPITAL_COMMUNITY): Payer: Managed Care, Other (non HMO)

## 2022-02-17 ENCOUNTER — Inpatient Hospital Stay (HOSPITAL_COMMUNITY): Payer: Managed Care, Other (non HMO) | Admitting: Hematology

## 2022-02-17 MED FILL — Fosaprepitant Dimeglumine For IV Infusion 150 MG (Base Eq): INTRAVENOUS | Qty: 5 | Status: AC

## 2022-02-17 MED FILL — Dexamethasone Sodium Phosphate Inj 100 MG/10ML: INTRAMUSCULAR | Qty: 1 | Status: AC

## 2022-02-18 ENCOUNTER — Inpatient Hospital Stay (HOSPITAL_COMMUNITY): Payer: Managed Care, Other (non HMO)

## 2022-02-18 ENCOUNTER — Inpatient Hospital Stay (HOSPITAL_BASED_OUTPATIENT_CLINIC_OR_DEPARTMENT_OTHER): Payer: Managed Care, Other (non HMO) | Admitting: Hematology

## 2022-02-18 VITALS — BP 147/85 | HR 75 | Temp 97.5°F | Resp 18

## 2022-02-18 VITALS — BP 120/75 | HR 100 | Temp 97.5°F | Resp 18

## 2022-02-18 DIAGNOSIS — Z170 Estrogen receptor positive status [ER+]: Secondary | ICD-10-CM

## 2022-02-18 DIAGNOSIS — C50812 Malignant neoplasm of overlapping sites of left female breast: Secondary | ICD-10-CM

## 2022-02-18 DIAGNOSIS — Z95828 Presence of other vascular implants and grafts: Secondary | ICD-10-CM

## 2022-02-18 LAB — COMPREHENSIVE METABOLIC PANEL
ALT: 15 U/L (ref 0–44)
AST: 20 U/L (ref 15–41)
Albumin: 3.2 g/dL — ABNORMAL LOW (ref 3.5–5.0)
Alkaline Phosphatase: 144 U/L — ABNORMAL HIGH (ref 38–126)
Anion gap: 7 (ref 5–15)
BUN: 10 mg/dL (ref 8–23)
CO2: 23 mmol/L (ref 22–32)
Calcium: 9.4 mg/dL (ref 8.9–10.3)
Chloride: 107 mmol/L (ref 98–111)
Creatinine, Ser: 1.08 mg/dL — ABNORMAL HIGH (ref 0.44–1.00)
GFR, Estimated: 57 mL/min — ABNORMAL LOW (ref >60)
Glucose, Bld: 125 mg/dL — ABNORMAL HIGH (ref 70–99)
Potassium: 3.5 mmol/L (ref 3.5–5.1)
Sodium: 137 mmol/L (ref 135–145)
Total Bilirubin: 0.6 mg/dL (ref 0.3–1.2)
Total Protein: 6.7 g/dL (ref 6.5–8.1)

## 2022-02-18 LAB — CBC WITH DIFFERENTIAL/PLATELET
Abs Immature Granulocytes: 0.1 10*3/uL — ABNORMAL HIGH (ref 0.00–0.07)
Basophils Absolute: 0 10*3/uL (ref 0.0–0.1)
Basophils Relative: 1 %
Eosinophils Absolute: 0.2 10*3/uL (ref 0.0–0.5)
Eosinophils Relative: 2 %
HCT: 30.1 % — ABNORMAL LOW (ref 36.0–46.0)
Hemoglobin: 9.3 g/dL — ABNORMAL LOW (ref 12.0–15.0)
Immature Granulocytes: 1 %
Lymphocytes Relative: 9 %
Lymphs Abs: 0.8 10*3/uL (ref 0.7–4.0)
MCH: 32.9 pg (ref 26.0–34.0)
MCHC: 30.9 g/dL (ref 30.0–36.0)
MCV: 106.4 fL — ABNORMAL HIGH (ref 80.0–100.0)
Monocytes Absolute: 0.7 10*3/uL (ref 0.1–1.0)
Monocytes Relative: 8 %
Neutro Abs: 6.7 10*3/uL (ref 1.7–7.7)
Neutrophils Relative %: 79 %
Platelets: 315 10*3/uL (ref 150–400)
RBC: 2.83 MIL/uL — ABNORMAL LOW (ref 3.87–5.11)
RDW: 19.5 % — ABNORMAL HIGH (ref 11.5–15.5)
WBC: 8.5 10*3/uL (ref 4.0–10.5)
nRBC: 0 % (ref 0.0–0.2)

## 2022-02-18 LAB — MAGNESIUM: Magnesium: 1.2 mg/dL — ABNORMAL LOW (ref 1.7–2.4)

## 2022-02-18 MED ORDER — SODIUM CHLORIDE 0.9 % IV SOLN
420.0000 mg | Freq: Once | INTRAVENOUS | Status: AC
  Administered 2022-02-18: 420 mg via INTRAVENOUS
  Filled 2022-02-18: qty 14

## 2022-02-18 MED ORDER — SODIUM CHLORIDE 0.9% FLUSH
10.0000 mL | INTRAVENOUS | Status: DC | PRN
  Administered 2022-02-18: 10 mL

## 2022-02-18 MED ORDER — SODIUM CHLORIDE 0.9 % IV SOLN
150.0000 mg | Freq: Once | INTRAVENOUS | Status: AC
  Administered 2022-02-18: 150 mg via INTRAVENOUS
  Filled 2022-02-18: qty 150

## 2022-02-18 MED ORDER — ACETAMINOPHEN 325 MG PO TABS
650.0000 mg | ORAL_TABLET | Freq: Once | ORAL | Status: AC
  Administered 2022-02-18: 650 mg via ORAL
  Filled 2022-02-18: qty 2

## 2022-02-18 MED ORDER — SODIUM CHLORIDE 0.9 % IV SOLN
200.0000 mg | Freq: Once | INTRAVENOUS | Status: AC
  Administered 2022-02-18: 200 mg via INTRAVENOUS
  Filled 2022-02-18: qty 20

## 2022-02-18 MED ORDER — DIPHENHYDRAMINE HCL 25 MG PO CAPS
50.0000 mg | ORAL_CAPSULE | Freq: Once | ORAL | Status: AC
  Administered 2022-02-18: 50 mg via ORAL
  Filled 2022-02-18: qty 2

## 2022-02-18 MED ORDER — TRASTUZUMAB-ANNS CHEMO 150 MG IV SOLR
6.0000 mg/kg | Freq: Once | INTRAVENOUS | Status: AC
  Administered 2022-02-18: 399 mg via INTRAVENOUS
  Filled 2022-02-18: qty 19

## 2022-02-18 MED ORDER — SODIUM CHLORIDE 0.9 % IV SOLN: Freq: Once | INTRAVENOUS | Status: AC

## 2022-02-18 MED ORDER — PALONOSETRON HCL INJECTION 0.25 MG/5ML
0.2500 mg | Freq: Once | INTRAVENOUS | Status: AC
  Administered 2022-02-18: 0.25 mg via INTRAVENOUS
  Filled 2022-02-18: qty 5

## 2022-02-18 MED ORDER — SODIUM CHLORIDE 0.9 % IV SOLN
50.0000 mg/m2 | Freq: Once | INTRAVENOUS | Status: AC
  Administered 2022-02-18: 90 mg via INTRAVENOUS
  Filled 2022-02-18: qty 9

## 2022-02-18 MED ORDER — HEPARIN SOD (PORK) LOCK FLUSH 100 UNIT/ML IV SOLN
500.0000 [IU] | Freq: Once | INTRAVENOUS | Status: AC | PRN
  Administered 2022-02-18: 500 [IU]

## 2022-02-18 MED ORDER — MAGNESIUM SULFATE 2 GM/50ML IV SOLN
2.0000 g | INTRAVENOUS | Status: AC
  Administered 2022-02-18 (×2): 2 g via INTRAVENOUS
  Filled 2022-02-18 (×2): qty 50

## 2022-02-18 MED ORDER — SODIUM CHLORIDE 0.9 % IV SOLN
10.0000 mg | Freq: Once | INTRAVENOUS | Status: AC
  Administered 2022-02-18: 10 mg via INTRAVENOUS
  Filled 2022-02-18: qty 10

## 2022-02-18 NOTE — Progress Notes (Signed)
Traci Fuller, [place] [id number]   CLINIC:  Medical Oncology/Hematology  PCP:  Celene Squibb, MD [name] [place] [address] [phone]   REASON FOR VISIT:  Follow-up for locally advanced left breast cancer  PRIOR THERAPY: none  NGS Results: not done  CURRENT THERAPY: Docetaxel + Carboplatin + Trastuzumab + Pertuzumab  (TCHP) q21d   BRIEF ONCOLOGIC HISTORY:  Oncology History  Breast cancer, left ([place])  10/15/2021 Initial Diagnosis   Breast cancer, left ([place])   11/04/2021 -  Chemotherapy   Patient is on Treatment Plan : BREAST  Docetaxel + Carboplatin + Trastuzumab + Pertuzumab  (TCHP) q21d       Genetic Testing   Negative genetic testing. No pathogenic variants identified on the [facility] Multi-Cancer+RNA panel. The report date is 11/02/2021.   The Multi-Cancer Panel + RNA offered by [facility] includes sequencing and/or deletion duplication testing of the following 84 genes: AIP, ALK, APC, ATM, AXIN2,BAP1,  BARD1, BLM, BMPR1A, BRCA1, BRCA2, BRIP1, CASR, CDC73, CDH1, CDK4, CDKN1B, CDKN1C, CDKN2A (p14ARF), CDKN2A (p16INK4a), CEBPA, CHEK2, CTNNA1, DICER1, DIS3L2, EGFR (c.2369C>T, p.Thr790Met variant only), EPCAM (Deletion/duplication testing only), FH, FLCN, GATA2, GPC3, GREM1 (Promoter region deletion/duplication testing only), HOXB13 (c.251G>A, p.Gly84Glu), HRAS, KIT, MAX, MEN1, MET, MITF (c.952G>A, p.Glu318Lys variant only), MLH1, MSH2, MSH3, MSH6, MUTYH, NBN, NF1, NF2, NTHL1, PALB2, PDGFRA, PHOX2B, PMS2, POLD1, POLE, POT1, PRKAR1A, PTCH1, PTEN, RAD50, RAD51C, RAD51D, RB1, RECQL4, RET, RUNX1, SDHAF2, SDHA (sequence changes only), SDHB, SDHC, SDHD, SMAD4, SMARCA4, SMARCB1, SMARCE1, STK11, SUFU, TERC, TERT, TMEM127, TP53, TSC1, TSC2, VHL, WRN and WT1.     CANCER STAGING:  Cancer Staging  Breast cancer, left ([place]) Staging form: Breast, AJCC 8th Edition - Clinical stage from 10/15/2021: Stage IIA (cT3, cN1, cM0, G2, ER+, PR+, HER2+) -  Unsigned   INTERVAL HISTORY:  [name], a 66 y.o. female, returns for routine follow-up and consideration for next cycle of chemotherapy. Zharia was last seen on 02/10/2022.  Due for cycle #5 of TCHP today.   Overall, she tells me she has been feeling pretty well. She has gained 4 lbs since her last visit. She is drinking 2 Ensures mixed with ice cream daily; she is not eating solid food. She continues to have diarrhea occurring 3-5 times daily which is helped with lomotil and imodium. She reports she has not experienced nausea over the past week.   Overall, she feels ready for next cycle of chemo today.    REVIEW OF SYSTEMS:  Review of Systems  Constitutional:  Negative for appetite change and fatigue.  Gastrointestinal:  Positive for diarrhea. Negative for nausea.  All other systems reviewed and are negative.   PAST MEDICAL/SURGICAL HISTORY:  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 DES to midLAD, 75% stenosis of RCA not treated   Cancer [facility])    Current smoker    Essential hypertension    Family history of bladder cancer    Family history of breast cancer    Family history of leukemia    Family history of uterine cancer    Hyperlipidemia LDL goal <70    Myocardial infarction ([place])    PONV (postoperative nausea and vomiting)    Port-A-Cath in place 10/29/2021   Past Surgical History:  Procedure Laterality Date   ABDOMINAL HYSTERECTOMY     BREAST BIOPSY Bilateral 10/16/2021   CORONARY STENT INTERVENTION N/A 01/10/2018   Procedure:  CORONARY STENT INTERVENTION;  Surgeon: Jettie Booze, MD;  Location: [facility];  Service: Cardiovascular;  Laterality: N/A;   LEFT HEART CATH AND CORONARY ANGIOGRAPHY N/A 01/10/2018   Procedure: LEFT HEART CATH AND CORONARY ANGIOGRAPHY;  Surgeon: Jettie Booze, MD;  Location: [facility];  Service: Cardiovascular;  Laterality: N/A;   PORTACATH  PLACEMENT Right 10/26/2021   Procedure: INSERTION PORT-A-CATH;  Surgeon: Virl Cagey, MD;  Location: [facility];  Service: General;  Laterality: Right;    SOCIAL HISTORY:  Social History   Socioeconomic History   Marital status: Married    Spouse name: Not on file   Number of children: Not on file   Years of education: Not on file   Highest education level: Not on file  Occupational History   Not on file  Tobacco Use   Smoking status: Every Day    Packs/day: 1.00    Years: 43.00    Total pack years: 43.00    Types: Cigarettes    Start date: 1976   Smokeless tobacco: Never  Vaping Use   Vaping Use: Never used  Substance and Sexual Activity   Alcohol use: Not Currently   Drug use: Never   Sexual activity: Yes  Other Topics Concern   Not on file  Social History Narrative   Not on file   Social Determinants of Health   Financial Resource Strain: Not on file  Food Insecurity: Not on file  Transportation Needs: Not on file  Physical Activity: Not on file  Stress: Not on file  Social Connections: Not on file  Intimate Partner Violence: Not on file    FAMILY HISTORY:  Family History  Problem Relation Age of Onset   Bladder Cancer Mother 16   Cervical cancer Mother        dx 16s, hysterectomy   Hypertension Father    Cirrhosis Father    Leukemia Brother 67   Liver cancer Brother 55   Breast cancer Maternal Aunt        dx 70s   Cancer Maternal Uncle        unk type d. 78   Cancer Paternal Aunt        unk type   Breast cancer Maternal Grandmother        dx 50s   Uterine cancer Maternal Grandmother     CURRENT MEDICATIONS:  Current Outpatient Medications  Medication Sig Dispense Refill   ALPRAZolam (XANAX) 0.25 MG tablet Take 1 tablet (0.25 mg total) by mouth 2 (two) times daily as needed for anxiety. 60 tablet 2   amLODipine (NORVASC) 10 MG tablet Take 10 mg by mouth daily.     aspirin EC 81 MG EC tablet Take 1 tablet (81 mg total) by mouth daily. 90  tablet 3   CARBOPLATIN IV Inject into the vein every 21 ( twenty-one) days.     dexamethasone (DECADRON) 4 MG tablet Take 2 tablets (8 mg total) by mouth daily. Take in the mornings x7 days, starting on the first day after your next cycle of chemotherapy. 14 tablet 0   diphenoxylate-atropine (LOMOTIL) 2.5-0.025 MG tablet Take 2 tablets by mouth 4 (four) times daily as needed for diarrhea or loose stools. 60 tablet 3   DOCEtaxel (TAXOTERE IV) Inject into the vein every 21 ( twenty-one) days.     dronabinol (MARINOL) 5 MG capsule Take 1 capsule (5 mg total) by mouth 2 (two) times daily before a meal. 60 capsule 3  Evolocumab (REPATHA SURECLICK) 299 MG/ML SOAJ Inject 140 mg into the skin every 14 (fourteen) days. 2 mL 11   gabapentin (NEURONTIN) 300 MG capsule Take 300 mg by mouth 2 (two) times daily.     levocetirizine (XYZAL) 5 MG tablet Take 5 mg by mouth daily.     lidocaine-prilocaine (EMLA) cream Apply a small amount to port a cath site (do not rub in) and cover with plastic wrap 1 hour prior to chemotherapy appointments 30 g 3   magnesium oxide (MAG-OX) 400 (240 Mg) MG tablet Take 1 tablet (400 mg total) by mouth in the morning, at noon, and at bedtime. 90 tablet 3   metoprolol tartrate (LOPRESSOR) 25 MG tablet Take 1 tablet (25 mg total) by mouth 2 (two) times daily. Please call and schedule overdue appointment with Dr. Gwenlyn Found for further refills . SECOND ATTEMPT! (Patient taking differently: Take 25 mg by mouth daily as needed (if blood pressure is 120/80 or higher). Please call and schedule overdue appointment with Dr. Gwenlyn Found for further refills . SECOND ATTEMPT!) 30 tablet 0   OLANZapine (ZYPREXA) 5 MG tablet Take 1 tablet (5 mg total) by mouth at bedtime for 8 days. Take at bedtime starting the evening after you have received your next cycle of chemotherapy (Wednesday 02/10/2022). 8 tablet 0   omeprazole (PRILOSEC) 20 MG capsule Take 20 mg by mouth daily.     ondansetron (ZOFRAN-ODT) 8 MG  disintegrating tablet Take 1 tablet (8 mg total) by mouth every 8 (eight) hours. 30 tablet 0   pegfilgrastim-bmez (ZIEXTENZO) 6 MG/0.6ML injection Inject 6 mg into the skin every 21 ( twenty-one) days. AFTER chemotherapy.     Pertuzumab (PERJETA IV) Inject into the vein every 21 ( twenty-one) days.     prochlorperazine (COMPAZINE) 10 MG tablet Take 1 tablet (10 mg total) by mouth every 6 (six) hours as needed (Nausea or vomiting). 30 tablet 3   scopolamine (TRANSDERM-SCOP) 1 MG/3DAYS Place 1 patch (1.5 mg total) onto the skin every 3 (three) days. 10 patch 12   ticagrelor (BRILINTA) 90 MG TABS tablet Take 1 tablet (90 mg total) by mouth 2 (two) times daily. (Patient taking differently: Take 90 mg by mouth daily.) 180 tablet 3   trastuzumab-anns (KANJINTI) 150 MG SOLR injection Inject 462 mg into the vein every 21 ( twenty-one) days.     No current facility-administered medications for this visit.    ALLERGIES:  No Known Allergies  PHYSICAL EXAM:  Performance status (ECOG): 1 - Symptomatic but completely ambulatory  Vitals:   02/18/22 0802  BP: 120/75  Pulse: 100  Resp: 18  Temp: (!) 97.5 F (36.4 C)  SpO2: 97%   Wt Readings from Last 3 Encounters:  02/10/22 142 lb 12.8 oz (64.8 kg)  02/02/22 148 lb 9.6 oz (67.4 kg)  01/29/22 146 lb 6.4 oz (66.4 kg)   Physical Exam Vitals reviewed.  Constitutional:      Appearance: Normal appearance.  Cardiovascular:     Rate and Rhythm: Normal rate and regular rhythm.     Pulses: Normal pulses.     Heart sounds: Normal heart sounds.  Pulmonary:     Effort: Pulmonary effort is normal.     Breath sounds: Normal breath sounds.  Neurological:     General: No focal deficit present.     Mental Status: She is alert and oriented to person, place, and time.  Psychiatric:        Mood and Affect: Mood normal.  Behavior: Behavior normal.     LABORATORY DATA:  I have reviewed the labs as listed.     Latest Ref Rng & Units 02/10/2022     8:43 AM 02/02/2022    8:07 AM 01/27/2022   10:22 AM  CBC  WBC 4.0 - 10.5 K/uL 7.9  10.2  11.6   Hemoglobin 12.0 - 15.0 g/dL 9.2  9.4  11.3   Hematocrit 36.0 - 46.0 % 29.1  29.7  35.2   Platelets 150 - 400 K/uL 154  142  245       Latest Ref Rng & Units 02/10/2022    8:43 AM 02/05/2022    8:50 AM 02/04/2022    8:19 AM  CMP  Glucose 70 - 99 mg/dL 118  125  119   BUN 8 - 23 mg/dL _0 Creatinine 0.44 - 1.00 mg/dL 1.13  1.32  1.25   Sodium 135 - 145 mmol/L 137  138  137   Potassium 3.5 - 5.1 mmol/L 2.9  2.9  3.0   Chloride 98 - 111 mmol/L 108  110  110   CO2 22 - 32 mmol/L _1 Calcium 8.9 - 10.3 mg/dL 9.3  9.0  9.1   Total Protein 6.5 - 8.1 g/dL 6.5   6.3   Total Bilirubin 0.3 - 1.2 mg/dL 0.7   0.9   Alkaline Phos 38 - 126 U/L 126   153   AST 15 - 41 U/L 15   14   ALT 0 - 44 U/L 11   14     DIAGNOSTIC IMAGING:  I have independently reviewed the scans and discussed with the patient. CT Chest W Contrast  Result Date: 02/04/2022 CLINICAL DATA:  Metastatic breast cancer, pulmonary nodules recently diagnosed left breast cancer, ongoing chemotherapy * Tracking Code: BO * EXAM: CT CHEST WITH CONTRAST TECHNIQUE: Multidetector CT imaging of the chest was performed during intravenous contrast administration. RADIATION DOSE REDUCTION: This exam was performed according to the departmental dose-optimization program which includes automated exposure control, adjustment of the mA and/or kV according to patient size and/or use of iterative reconstruction technique. CONTRAST:  73m OMNIPAQUE IOHEXOL 300 MG/ML  SOLN COMPARISON:  CT abdomen pelvis, 01/05/2022, PET-CT, 10/22/2021 FINDINGS: Cardiovascular: Right chest port catheter. Aortic atherosclerosis. Normal heart size. Three-vessel coronary artery calcifications. No pericardial effusion. Mediastinum/Nodes: Interval decrease in size of multiple previously enlarged and hypermetabolic left axillary and subpectoral lymph nodes, index subpectoral  node measuring no greater than 0.6 cm, previously 1.8 x 1.2 cm (series 2, image 32). No persistently enlarged mediastinal, hilar, or axillary lymph nodes. Thyroid gland, trachea, and esophagus demonstrate no significant findings. Lungs/Pleura: Moderate centrilobular emphysema. Interval decrease in size of multiple small bilateral pulmonary nodules, the largest of which were previously hypermetabolic. A nodule of the peripheral left upper lobe measures 0.3 cm, previously 0.7 cm (series 4, image 43), and a nodule of the anterior left upper lobe measures 0.5 cm, previously 0.7 cm (series 4, image 36). Nodule of the right lower lobe is difficult to appreciate but remnant measures no greater than 0.3 cm (series 4, image 98). Additional rounded nodule of the dependent right lower lobe is unchanged measuring 0.5 cm (series 4, image 83). Small triangular nodular lymph node of the medial right upper lobe is unchanged measuring 0.4 cm (series 4, image 31). No pleural effusion or pneumothorax. Upper Abdomen: No acute abnormality. Musculoskeletal: Partially imaged mass of the left breast  appears diminished in size with overlying skin thickening (series 2, image 70). No suspicious osseous lesions identified. IMPRESSION: 1. Interval decrease in size of multiple previously enlarged and hypermetabolic left axillary and subpectoral lymph nodes. 2. Partially imaged left breast mass with overlying skin thickening appears diminished in size. 3. Interval decrease in size of multiple small bilateral pulmonary nodules, the largest of which were previously hypermetabolic. 4. Constellation of findings is consistent with treatment response of primary breast malignancy with nodal and pulmonary metastatic disease. 5. Emphysema. 6. Coronary artery disease. Aortic Atherosclerosis (ICD10-I70.0) and Emphysema (ICD10-J43.9). Electronically Signed   By: Delanna Ahmadi M.D.   On: 02/04/2022 16:26     ASSESSMENT:  Locally advanced HER2 positive left  breast cancer: - Patient felt lump over the last 1 year. - She noticed lump has gotten bigger and deformed since September 2022.  She has itching over the skin of the breast. - Left breast 4:00 biopsy: Invasive mammary carcinoma, grade 2, ER 100%, PR 70%, Ki-67 25%, HER2 FISH positive - Left breast 2:00 biopsy: Invasive ductal carcinoma, grade 2, ER 100%, PR 70%, Ki-67 25%, HER2 negative - Left axillary lymph node biopsy: IDC, ER 100%, PR 50%, HER2 negative, E-cadherin positive - Bilateral breast MRI on 10/09/2021: Multiple enhancing masses with intervening non-mass enhancement involving most of the LOQ and part of the UOQ.  Abnormal enhancement spans approximately 12.4 x 6.4 x 7 cm.  Diffuse skin thickening/edema.  Multiple left axillary lymph nodes, largest node measuring approximately 2.4 cm in the lower axilla.  Indeterminate non-mass enhancement inferior to the biopsy site in the subareolar right breast measuring 1.5 x 0.9 x 0.8 cm. - Right breast biopsy UOQ on 10/01/2021: Fibroadenomatoid nodule with calcifications, fibrocystic changes including apocrine metaplasia with no malignancy. -We have reviewed PET scan from 10/22/2021: Large left breast mass and numerous hypermetabolic left axillary and subpectoral lymph nodes.  2 hypermetabolic left upper lobe lung nodules measuring 7 mm with SUV 4.2 and 3.2 respectively.  No other hypermetabolic lung nodules.  No distant metastatic disease.  Nonspecific low-level subareolar activity in the right breast near the surgical clips and a single mildly hypermetabolic right axillary lymph node. - Echocardiogram shows EF 60 to 65%. - She has oligometastatic disease with 2 small lung nodules.  Biopsy is not feasible.  I have recommended TCHP regimen followed by restaging after 3 cycles. - Cycle 1 of TCHP on 11/04/2021.  Cycle 4 dose reduced with carboplatin 400 mg and docetaxel 60 Mg/M squared. - CT chest (02/03/2022): Left upper lobe lung nodule 0.3 cm previously 0.7  cm.  Left upper lobe lung nodule measures 0.5 cm, previously 0.7 cm.  Subpectoral lymph node has decreased to 0.6 from 1.8 x 1.2 cm.  Interval decrease in the breast mass.  No new areas seen.    Social/family history: - She lives at home with her husband and daughter.  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  Current active smoker, half pack per day since age 46. - Mother had cervical cancer and bladder cancer.  Maternal grandmother had ovarian cancer.  Brother died at age 50 from acute leukemia.  Another brother died of liver cancer.  Maternal half aunt had breast cancer.   PLAN:  Oligometastatic (two 7 mm lung lesions) HER2 positive left breast cancer: - She has gained about 4 pounds.  She is slightly feeling better. - Reviewed labs today which showed creatinine 1.08 at baseline.  Elevated alkaline phosphatase.  CBC was grossly  normal. - CA 15-3 improved to 37.  CA 27-29 improved to 54. - We will proceed with cycle 5 today.  We will decrease carboplatin dose to 200 mg and Taxotere to 50 mg per metered square. - We will schedule her for IV fluids every day next week.  She will be evaluated in symptom management clinic.  RTC 3 weeks for follow-up.  2.  Right facial shingles/postherpetic right hemifacial pain: - Continue gabapentin 300 mg 3 times daily.  3.  Hypomagnesemia: - She cannot take magnesium due to diarrhea. - Magnesium today is 1.2.  She will receive 4 g IV.  4.  Anxiety: - Continue Xanax 0.25 mg at bedtime daily.  5.  Hypertension: - Norvasc is on hold.  Blood pressure today is 120/75.  6.  Weight loss: - She has lost 16 pounds in the last 3 weeks after last cycle.  She lost total of 28 pounds since cycle 1. - She is drinking 2 cans of Ensure and milkshake daily.  She gained about 4 pounds since last week.   Orders placed this encounter:  No orders of the defined types were placed in this encounter.    Derek Jack, MD [place] [phone]   I, Thana Ates, am acting as a scribe for Dr. Derek Jack.  I, Derek Jack MD, have reviewed the above documentation for accuracy and completeness, and I agree with the above.

## 2022-02-18 NOTE — Progress Notes (Signed)
Patients port flushed without difficulty.  Good blood return noted with no bruising or swelling noted at site.  Patient remains accessed for chemotherapy treatment.  

## 2022-02-18 NOTE — Patient Instructions (Addendum)
[place] at [facility] Discharge Instructions   You were seen and examined today by Dr. Delton Coombes.  He reviewed the results of your lab work which are normal/stable except for your magnesium. We will give you IV magnesium in the clinic today.  We will proceed with your treatment today with chemotherapy dose reduced by 50%.   Return as scheduled.      Thank you for choosing [place] at [facility] to provide your oncology and hematology care.  To afford each patient quality time with our provider, please arrive at least 15 minutes before your scheduled appointment time.   If you have a lab appointment with the [facility] please come in thru the Main Entrance and check in at the main information desk.  You need to re-schedule your appointment should you arrive 10 or more minutes late.  We strive to give you quality time with our providers, and arriving late affects you and other patients whose appointments are after yours.  Also, if you no show three or more times for appointments you may be dismissed from the clinic at the providers discretion.     Again, thank you for choosing [facility].  Our hope is that these requests will decrease the amount of time that you wait before being seen by our physicians.       _____________________________________________________________  Should you have questions after your visit to [facility], please contact our office at [phone] and follow the prompts.  Our office hours are 8:00 a.m. and 4:30 p.m. Monday - Friday.  Please note that voicemails left after 4:00 p.m. may not be returned until the following business day.  We are closed weekends and major holidays.  You do have access to a nurse 24-7, just call the main number to the clinic [phone] and do not press any options, hold on the line and a nurse will answer the phone.    For prescription refill requests,  have your pharmacy contact our office and allow 72 hours.    Due to Covid, you will need to wear a mask upon entering the hospital. If you do not have a mask, a mask will be given to you at the Main Entrance upon arrival. For doctor visits, patients may have 1 support person age 23 or older with them. For treatment visits, patients can not have anyone with them due to social distancing guidelines and our immunocompromised population.

## 2022-02-18 NOTE — Progress Notes (Signed)
Patient presents today for [facility] per providers order.  Vital signs within parameters for treatment.  Magnesium noted to be 1.2.  MD notified.    Message received from Anastasio Champion RN/Dr. Delton Coombes patient okay for treatment.  Patient is to receive 4 grams of IV Magnesium with treatment.  TCHP and 4 grams Magnesium given today per MD orders.  Stable during infusion without adverse affects.  Vital signs stable.  No complaints at this time.  Discharge from clinic ambulatory in stable condition.  Alert and oriented X 3.  Follow up with [facility] as scheduled.

## 2022-02-18 NOTE — Patient Instructions (Signed)
[facility]  Discharge Instructions: Thank you for choosing [facility] to provide your oncology and hematology care.  If you have a lab appointment with the [facility], please come in thru the Main Entrance and check in at the main information desk.  Wear comfortable clothing and clothing appropriate for easy access to any Portacath or PICC line.   We strive to give you quality time with your provider. You may need to reschedule your appointment if you arrive late (15 or more minutes).  Arriving late affects you and other patients whose appointments are after yours.  Also, if you miss three or more appointments without notifying the office, you may be dismissed from the clinic at the provider's discretion.      For prescription refill requests, have your pharmacy contact our office and allow 72 hours for refills to be completed.    Today you received the following chemotherapy and/or immunotherapy agents TCHP      To help prevent nausea and vomiting after your treatment, we encourage you to take your nausea medication as directed.  BELOW ARE SYMPTOMS THAT SHOULD BE REPORTED IMMEDIATELY: *FEVER GREATER THAN 100.4 F (38 C) OR HIGHER *CHILLS OR SWEATING *NAUSEA AND VOMITING THAT IS NOT CONTROLLED WITH YOUR NAUSEA MEDICATION *UNUSUAL SHORTNESS OF BREATH *UNUSUAL BRUISING OR BLEEDING *URINARY PROBLEMS (pain or burning when urinating, or frequent urination) *BOWEL PROBLEMS (unusual diarrhea, constipation, pain near the anus) TENDERNESS IN MOUTH AND THROAT WITH OR WITHOUT PRESENCE OF ULCERS (sore throat, sores in mouth, or a toothache) UNUSUAL RASH, SWELLING OR PAIN  UNUSUAL VAGINAL DISCHARGE OR ITCHING   Items with * indicate a potential emergency and should be followed up as soon as possible or go to the Emergency Department if any problems should occur.  Please show the CHEMOTHERAPY ALERT CARD or IMMUNOTHERAPY ALERT CARD at check-in to the Emergency Department  and triage nurse.  Should you have questions after your visit or need to cancel or reschedule your appointment, please contact [facility] [phone]  and follow the prompts.  Office hours are 8:00 a.m. to 4:30 p.m. Monday - Friday. Please note that voicemails left after 4:00 p.m. may not be returned until the following business day.  We are closed weekends and major holidays. You have access to a nurse at all times for urgent questions. Please call the main number to the clinic [phone] and follow the prompts.  For any non-urgent questions, you may also contact your provider using MyChart. We now offer e-Visits for anyone 73 and older to request care online for non-urgent symptoms. For details visit [email or web address].   Also download the MyChart app! Go to the app store, search "MyChart", open the app, select [place] [place], and log in with your MyChart username and password.  Masks are optional in the cancer centers. If you would like for your care team to wear a mask while they are taking care of you, please let them know. For doctor visits, patients may have with them one support person who is at least 65 years old. At this time, visitors are not allowed in the infusion area.

## 2022-02-19 ENCOUNTER — Inpatient Hospital Stay (HOSPITAL_COMMUNITY): Payer: Managed Care, Other (non HMO)

## 2022-02-19 VITALS — BP 153/83 | HR 88 | Temp 97.6°F | Resp 18

## 2022-02-19 DIAGNOSIS — Z170 Estrogen receptor positive status [ER+]: Secondary | ICD-10-CM

## 2022-02-19 DIAGNOSIS — C50812 Malignant neoplasm of overlapping sites of left female breast: Secondary | ICD-10-CM | POA: Diagnosis not present

## 2022-02-19 LAB — CANCER ANTIGEN 15-3: CA 15-3: 37.4 U/mL — ABNORMAL HIGH (ref 0.0–25.0)

## 2022-02-19 LAB — CANCER ANTIGEN 27.29: CA 27.29: 54.6 U/mL — ABNORMAL HIGH (ref 0.0–38.6)

## 2022-02-19 MED ORDER — HEPARIN SOD (PORK) LOCK FLUSH 100 UNIT/ML IV SOLN
500.0000 [IU] | Freq: Once | INTRAVENOUS | Status: AC
  Administered 2022-02-19: 500 [IU] via INTRAVENOUS

## 2022-02-19 MED ORDER — SODIUM CHLORIDE 0.9% FLUSH
10.0000 mL | Freq: Once | INTRAVENOUS | Status: AC
  Administered 2022-02-19: 10 mL via INTRAVENOUS

## 2022-02-19 MED ORDER — MAGNESIUM SULFATE 2 GM/50ML IV SOLN
2.0000 g | Freq: Once | INTRAVENOUS | Status: AC
  Administered 2022-02-19: 2 g via INTRAVENOUS
  Filled 2022-02-19: qty 50

## 2022-02-19 MED ORDER — POTASSIUM CHLORIDE IN NACL 20-0.9 MEQ/L-% IV SOLN
Freq: Once | INTRAVENOUS | Status: AC
  Filled 2022-02-19: qty 1000

## 2022-02-19 NOTE — Progress Notes (Signed)
Patient presents today for House Fluids per providers order.  Vital signs WNL.  Patient has no new complaints at this time.  House fluids given today per MD orders.  Stable during infusion without adverse affects.  Vital signs stable.  No complaints at this time.  Discharge from clinic ambulatory in stable condition.  Alert and oriented X 3.  Follow up with [facility] as scheduled.

## 2022-02-19 NOTE — Patient Instructions (Signed)
[facility]  Discharge Instructions: Thank you for choosing [facility] to provide your oncology and hematology care.  If you have a lab appointment with the [facility], please come in thru the Main Entrance and check in at the main information desk.  Wear comfortable clothing and clothing appropriate for easy access to any Portacath or PICC line.   We strive to give you quality time with your provider. You may need to reschedule your appointment if you arrive late (15 or more minutes).  Arriving late affects you and other patients whose appointments are after yours.  Also, if you miss three or more appointments without notifying the office, you may be dismissed from the clinic at the provider's discretion.      For prescription refill requests, have your pharmacy contact our office and allow 72 hours for refills to be completed.    Today you received the following chemotherapy and/or immunotherapy agents house fluids       To help prevent nausea and vomiting after your treatment, we encourage you to take your nausea medication as directed.  BELOW ARE SYMPTOMS THAT SHOULD BE REPORTED IMMEDIATELY: *FEVER GREATER THAN 100.4 F (38 C) OR HIGHER *CHILLS OR SWEATING *NAUSEA AND VOMITING THAT IS NOT CONTROLLED WITH YOUR NAUSEA MEDICATION *UNUSUAL SHORTNESS OF BREATH *UNUSUAL BRUISING OR BLEEDING *URINARY PROBLEMS (pain or burning when urinating, or frequent urination) *BOWEL PROBLEMS (unusual diarrhea, constipation, pain near the anus) TENDERNESS IN MOUTH AND THROAT WITH OR WITHOUT PRESENCE OF ULCERS (sore throat, sores in mouth, or a toothache) UNUSUAL RASH, SWELLING OR PAIN  UNUSUAL VAGINAL DISCHARGE OR ITCHING   Items with * indicate a potential emergency and should be followed up as soon as possible or go to the Emergency Department if any problems should occur.  Please show the CHEMOTHERAPY ALERT CARD or IMMUNOTHERAPY ALERT CARD at check-in to the Emergency  Department and triage nurse.  Should you have questions after your visit or need to cancel or reschedule your appointment, please contact [facility] [phone]  and follow the prompts.  Office hours are 8:00 a.m. to 4:30 p.m. Monday - Friday. Please note that voicemails left after 4:00 p.m. may not be returned until the following business day.  We are closed weekends and major holidays. You have access to a nurse at all times for urgent questions. Please call the main number to the clinic [phone] and follow the prompts.  For any non-urgent questions, you may also contact your provider using MyChart. We now offer e-Visits for anyone 49 and older to request care online for non-urgent symptoms. For details visit [email or web address].   Also download the MyChart app! Go to the app store, search "MyChart", open the app, select [facility], and log in with your MyChart username and password.  Masks are optional in the cancer centers. If you would like for your care team to wear a mask while they are taking care of you, please let them know. For doctor visits, patients may have with them one support person who is at least 65 years old. At this time, visitors are not allowed in the infusion area.

## 2022-02-24 ENCOUNTER — Inpatient Hospital Stay (HOSPITAL_COMMUNITY): Payer: Managed Care, Other (non HMO)

## 2022-02-24 ENCOUNTER — Inpatient Hospital Stay (HOSPITAL_COMMUNITY): Payer: Managed Care, Other (non HMO) | Attending: Hematology

## 2022-02-24 DIAGNOSIS — C50812 Malignant neoplasm of overlapping sites of left female breast: Secondary | ICD-10-CM | POA: Diagnosis present

## 2022-02-24 DIAGNOSIS — Z79899 Other long term (current) drug therapy: Secondary | ICD-10-CM | POA: Diagnosis not present

## 2022-02-24 DIAGNOSIS — I10 Essential (primary) hypertension: Secondary | ICD-10-CM | POA: Insufficient documentation

## 2022-02-24 DIAGNOSIS — Z170 Estrogen receptor positive status [ER+]: Secondary | ICD-10-CM | POA: Insufficient documentation

## 2022-02-24 DIAGNOSIS — E860 Dehydration: Secondary | ICD-10-CM

## 2022-02-24 DIAGNOSIS — Z5111 Encounter for antineoplastic chemotherapy: Secondary | ICD-10-CM | POA: Diagnosis present

## 2022-02-24 DIAGNOSIS — F419 Anxiety disorder, unspecified: Secondary | ICD-10-CM | POA: Insufficient documentation

## 2022-02-24 LAB — CBC WITH DIFFERENTIAL/PLATELET
Abs Immature Granulocytes: 0.14 10*3/uL — ABNORMAL HIGH (ref 0.00–0.07)
Basophils Absolute: 0 10*3/uL (ref 0.0–0.1)
Basophils Relative: 0 %
Eosinophils Absolute: 0.1 10*3/uL (ref 0.0–0.5)
Eosinophils Relative: 0 %
HCT: 30.5 % — ABNORMAL LOW (ref 36.0–46.0)
Hemoglobin: 9.4 g/dL — ABNORMAL LOW (ref 12.0–15.0)
Immature Granulocytes: 1 %
Lymphocytes Relative: 8 %
Lymphs Abs: 1.2 10*3/uL (ref 0.7–4.0)
MCH: 32.8 pg (ref 26.0–34.0)
MCHC: 30.8 g/dL (ref 30.0–36.0)
MCV: 106.3 fL — ABNORMAL HIGH (ref 80.0–100.0)
Monocytes Absolute: 0.9 10*3/uL (ref 0.1–1.0)
Monocytes Relative: 6 %
Neutro Abs: 13.7 10*3/uL — ABNORMAL HIGH (ref 1.7–7.7)
Neutrophils Relative %: 85 %
Platelets: 183 10*3/uL (ref 150–400)
RBC: 2.87 MIL/uL — ABNORMAL LOW (ref 3.87–5.11)
RDW: 18.4 % — ABNORMAL HIGH (ref 11.5–15.5)
WBC: 16 10*3/uL — ABNORMAL HIGH (ref 4.0–10.5)
nRBC: 0 % (ref 0.0–0.2)

## 2022-02-24 LAB — COMPREHENSIVE METABOLIC PANEL
ALT: 46 U/L — ABNORMAL HIGH (ref 0–44)
AST: 26 U/L (ref 15–41)
Albumin: 3.7 g/dL (ref 3.5–5.0)
Alkaline Phosphatase: 206 U/L — ABNORMAL HIGH (ref 38–126)
Anion gap: 9 (ref 5–15)
BUN: 41 mg/dL — ABNORMAL HIGH (ref 8–23)
CO2: 21 mmol/L — ABNORMAL LOW (ref 22–32)
Calcium: 9.7 mg/dL (ref 8.9–10.3)
Chloride: 103 mmol/L (ref 98–111)
Creatinine, Ser: 1.2 mg/dL — ABNORMAL HIGH (ref 0.44–1.00)
GFR, Estimated: 51 mL/min — ABNORMAL LOW (ref >60)
Glucose, Bld: 111 mg/dL — ABNORMAL HIGH (ref 70–99)
Potassium: 4.7 mmol/L (ref 3.5–5.1)
Sodium: 133 mmol/L — ABNORMAL LOW (ref 135–145)
Total Bilirubin: 0.4 mg/dL (ref 0.3–1.2)
Total Protein: 7 g/dL (ref 6.5–8.1)

## 2022-02-24 LAB — MAGNESIUM: Magnesium: 1.4 mg/dL — ABNORMAL LOW (ref 1.7–2.4)

## 2022-02-24 MED ORDER — HEPARIN SOD (PORK) LOCK FLUSH 100 UNIT/ML IV SOLN
500.0000 [IU] | Freq: Once | INTRAVENOUS | Status: AC
  Administered 2022-02-24: 500 [IU] via INTRAVENOUS

## 2022-02-24 MED ORDER — SODIUM CHLORIDE 0.9% FLUSH
10.0000 mL | Freq: Once | INTRAVENOUS | Status: AC
  Administered 2022-02-24: 10 mL via INTRAVENOUS

## 2022-02-24 MED ORDER — MAGNESIUM SULFATE 2 GM/50ML IV SOLN
2.0000 g | Freq: Once | INTRAVENOUS | Status: AC
  Administered 2022-02-24: 2 g via INTRAVENOUS
  Filled 2022-02-24: qty 50

## 2022-02-24 MED ORDER — SODIUM CHLORIDE 0.9 % IV SOLN: Freq: Once | INTRAVENOUS | Status: AC

## 2022-02-24 NOTE — Progress Notes (Signed)
Patients port flushed without difficulty.  Good blood return noted with no bruising or swelling noted at site.  Patient remains accessed for IVF.

## 2022-02-24 NOTE — Progress Notes (Signed)
Patient presents today for IVF.  Patient is in satisfactory condition with no new complaints voiced.  Vital signs are stable.  Labs reviewed.  Creatinine today is 1.20 and magnesium is 1.4.  Per Tarri Abernethy, PA-C we will give patient 1 L NS over 2 hours and 2 g IV Magnesium x one dose today.    Patient tolerated treatment well with no complaints voiced.  Patient left ambulatory in stable condition.  Vital signs stable at discharge.  Follow up as scheduled.

## 2022-02-24 NOTE — Patient Instructions (Signed)
[facility]  Discharge Instructions: Thank you for choosing [facility] to provide your oncology and hematology care.  If you have a lab appointment with the Cancer Center, please come in thru the Main Entrance and check in at the main information desk.  Wear comfortable clothing and clothing appropriate for easy access to any Portacath or PICC line.   We strive to give you quality time with your provider. You may need to reschedule your appointment if you arrive late (15 or more minutes).  Arriving late affects you and other patients whose appointments are after yours.  Also, if you miss three or more appointments without notifying the office, you may be dismissed from the clinic at the provider's discretion.      For prescription refill requests, have your pharmacy contact our office and allow 72 hours for refills to be completed.         To help prevent nausea and vomiting after your treatment, we encourage you to take your nausea medication as directed.  BELOW ARE SYMPTOMS THAT SHOULD BE REPORTED IMMEDIATELY: *FEVER GREATER THAN 100.4 F (38 C) OR HIGHER *CHILLS OR SWEATING *NAUSEA AND VOMITING THAT IS NOT CONTROLLED WITH YOUR NAUSEA MEDICATION *UNUSUAL SHORTNESS OF BREATH *UNUSUAL BRUISING OR BLEEDING *URINARY PROBLEMS (pain or burning when urinating, or frequent urination) *BOWEL PROBLEMS (unusual diarrhea, constipation, pain near the anus) TENDERNESS IN MOUTH AND THROAT WITH OR WITHOUT PRESENCE OF ULCERS (sore throat, sores in mouth, or a toothache) UNUSUAL RASH, SWELLING OR PAIN  UNUSUAL VAGINAL DISCHARGE OR ITCHING   Items with * indicate a potential emergency and should be followed up as soon as possible or go to the Emergency Department if any problems should occur.  Please show the CHEMOTHERAPY ALERT CARD or IMMUNOTHERAPY ALERT CARD at check-in to the Emergency Department and triage nurse.  Should you have questions after your visit or need to  cancel or reschedule your appointment, please contact [facility] [phone]  and follow the prompts.  Office hours are 8:00 a.m. to 4:30 p.m. Monday - Friday. Please note that voicemails left after 4:00 p.m. may not be returned until the following business day.  We are closed weekends and major holidays. You have access to a nurse at all times for urgent questions. Please call the main number to the clinic [phone] and follow the prompts.  For any non-urgent questions, you may also contact your provider using MyChart. We now offer e-Visits for anyone 18 and older to request care online for non-urgent symptoms. For details visit [email or web address].   Also download the MyChart app! Go to the app store, search "MyChart", open the app, select [facility], and log in with your MyChart username and password.  Masks are optional in the cancer centers. If you would like for your care team to wear a mask while they are taking care of you, please let them know. For doctor visits, patients may have with them one support person who is at least 65 years old. At this time, visitors are not allowed in the infusion area.  

## 2022-02-25 ENCOUNTER — Other Ambulatory Visit (HOSPITAL_COMMUNITY): Payer: Self-pay | Admitting: *Deleted

## 2022-02-25 ENCOUNTER — Inpatient Hospital Stay (HOSPITAL_COMMUNITY): Payer: Managed Care, Other (non HMO)

## 2022-02-25 ENCOUNTER — Ambulatory Visit (HOSPITAL_COMMUNITY): Payer: Managed Care, Other (non HMO) | Admitting: Dietician

## 2022-02-25 VITALS — BP 121/74 | HR 89 | Temp 98.0°F | Resp 18

## 2022-02-25 DIAGNOSIS — C50812 Malignant neoplasm of overlapping sites of left female breast: Secondary | ICD-10-CM

## 2022-02-25 MED ORDER — SODIUM CHLORIDE 0.9% FLUSH
10.0000 mL | INTRAVENOUS | Status: DC | PRN
  Administered 2022-02-25: 10 mL

## 2022-02-25 MED ORDER — HEPARIN SOD (PORK) LOCK FLUSH 100 UNIT/ML IV SOLN
500.0000 [IU] | Freq: Once | INTRAVENOUS | Status: AC
  Administered 2022-02-25: 500 [IU] via INTRAVENOUS

## 2022-02-25 MED ORDER — SODIUM CHLORIDE 0.9 % IV SOLN: INTRAVENOUS | Status: DC

## 2022-02-25 MED ORDER — POTASSIUM CHLORIDE IN NACL 20-0.9 MEQ/L-% IV SOLN: Freq: Once | INTRAVENOUS | Status: DC

## 2022-02-25 MED ORDER — MAGNESIUM SULFATE 2 GM/50ML IV SOLN
2.0000 g | Freq: Once | INTRAVENOUS | Status: AC
  Administered 2022-02-25: 2 g via INTRAVENOUS
  Filled 2022-02-25: qty 50

## 2022-02-25 NOTE — Patient Instructions (Signed)
[facility]  Discharge Instructions: Thank you for choosing [facility] to provide your oncology and hematology care.  If you have a lab appointment with the , please come in thru the Main Entrance and check in at the main information desk.  Wear comfortable clothing and clothing appropriate for easy access to any Portacath or PICC line.   We strive to give you quality time with your provider. You may need to reschedule your appointment if you arrive late (15 or more minutes).  Arriving late affects you and other patients whose appointments are after yours.  Also, if you miss three or more appointments without notifying the office, you may be dismissed from the clinic at the provider's discretion.      For prescription refill requests, have your pharmacy contact our office and allow 72 hours for refills to be completed.    Today you received the following chemotherapy and/or immunotherapy agents Normal saline and 2 grams magnesium      To help prevent nausea and vomiting after your treatment, we encourage you to take your nausea medication as directed.  BELOW ARE SYMPTOMS THAT SHOULD BE REPORTED IMMEDIATELY: *FEVER GREATER THAN 100.4 F (38 C) OR HIGHER *CHILLS OR SWEATING *NAUSEA AND VOMITING THAT IS NOT CONTROLLED WITH YOUR NAUSEA MEDICATION *UNUSUAL SHORTNESS OF BREATH *UNUSUAL BRUISING OR BLEEDING *URINARY PROBLEMS (pain or burning when urinating, or frequent urination) *BOWEL PROBLEMS (unusual diarrhea, constipation, pain near the anus) TENDERNESS IN MOUTH AND THROAT WITH OR WITHOUT PRESENCE OF ULCERS (sore throat, sores in mouth, or a toothache) UNUSUAL RASH, SWELLING OR PAIN  UNUSUAL VAGINAL DISCHARGE OR ITCHING   Items with * indicate a potential emergency and should be followed up as soon as possible or go to the Emergency Department if any problems should occur.  Please show the CHEMOTHERAPY ALERT CARD or IMMUNOTHERAPY ALERT CARD at  check-in to the Emergency Department and triage nurse.  Should you have questions after your visit or need to cancel or reschedule your appointment, please contact [facility] [phone]  and follow the prompts.  Office hours are 8:00 a.m. to 4:30 p.m. Monday - Friday. Please note that voicemails left after 4:00 p.m. may not be returned until the following business day.  We are closed weekends and major holidays. You have access to a nurse at all times for urgent questions. Please call the main number to the clinic [phone] and follow the prompts.  For any non-urgent questions, you may also contact your provider using MyChart. We now offer e-Visits for anyone 1 and older to request care online for non-urgent symptoms. For details visit [email or web address].   Also download the MyChart app! Go to the app store, search "MyChart", open the app, select [facility], and log in with your MyChart username and password.  Masks are optional in the cancer centers. If you would like for your care team to wear a mask while they are taking care of you, please let them know. For doctor visits, patients may have with them one support person who is at least 65 years old. At this time, visitors are not allowed in the infusion area.

## 2022-02-25 NOTE — Progress Notes (Signed)
Nutrition Follow-up:  Patient with breast cancer. She is receiving TCHP q21d.  Met with patient during IVF infusion. She reports diarrhea has slowed up some but continues to have daily episodes. This is managed with imodium and lomotil. Patient has not tried banatrol samples provided at last visit. Patient reports having a good appetite, but "nibbling" 3 times daily due to altered taste. Patient reports foods taste like metal. She is asking if food will ever taste good again. Patient would really like to eat a cheeseburger. Patient is drinking 2 Ensure Plus cut with small amount of milk. She is tolerating this well. Patient denies nausea, vomiting, constipation.    Medications: marinol, decadron, compazine,   Labs: 7/5 - Na 133, glucose 111, BUN 41, Cr 1.20, Mg 1.4  Anthropometrics: Weight 143 lb 11.8 oz on 7/5 stable x 2 weeks  6/21 - 142 lb 12.8 oz  6/13 - 148 lb 9.6 oz 5/31 - 158 lb 12.8 oz    NUTRITION DIAGNOSIS: Unintentional weight loss stable x 2 weeks    INTERVENTION:  Discussed strategies for altered taste, suggested baking soda salt water rinses several times daily specifically before meals - handout with tips + recipe provided Continue drinking Ensure Plus/equivalent, recommend 3/day One complimentary case Ensure Plus High Protein provided today Discussed ways to add calories and protein to foods, making the most of every bite - shake recipes given  Encouraged patient to try banatrol to aid with loose stools, discussed foods/drinks to add this to     MONITORING, EVALUATION, GOAL: weight trends, intake    NEXT VISIT: To be scheduled as needed

## 2022-02-25 NOTE — Progress Notes (Addendum)
[facility] [address], [place] [address] Phone: [phone] Fax: [phone]  SYMPTOMS MANAGEMENT CLINIC PROGRESS NOTE   Traci Fuller [id number] 1956-06-05 66 y.o.  Traci Fuller is managed by Dr. 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  Actively treated with chemotherapy/immunotherapy/hormonal therapy: YES  Current therapy:  TCHP (docetaxel + carboplatin + trastuzumab + pertuzumab) every 21 days  Last treated: 02/18/2022 (cycle #5)  Next scheduled appointment with provider: 03/11/2022 (due for cycle #6)  Subjective:  Chief Complaint: Chemo-induced nausea, vomiting, and diarrhea  Traci Fuller is managed by Dr. Delton Coombes for her oligometastatic breast cancer.  She is on [facility] every 21 days, last treated on 02/18/2022.  She has been previously seen for symptom management visit by me on several occasions for management of her severe dehydration, diarrhea, nausea/vomiting.  At her last visit, prescription was written for dexamethasone (8 mg daily on days 2-8) and olanzapine (5 mg nightly on days 1-8) for prophylaxis of chemotherapy-induced nausea and vomiting.  We recommended Zofran 8 mg as needed for breakthrough nausea and vomiting.  She was also started on Marinol for poor appetite.    She reports that she has tolerated cycle #5 of her chemotherapy much better than the previous cycles.  Since her chemotherapy dose was reduced and she had a dexamethasone and olanzapine added to her medications, she has not had any nausea or vomiting.  She has not required any Zofran during this most recent cycle.  She has mild diarrhea (up to 3 loose stools daily), but much better than with previous cycles of chemotherapy.  Oral intake has improved.  She is able to drink 1-2 Ensure daily as well as other homemade shakes and sandwiches.  She does not drink much water due to it "not  tasting good," but drinks milk and Gatorade throughout the day.  She reports her energy as fair (65%) and appetite at about 40%.  Functional status is good, and she is able to ambulate without assistance and keep up with her ADLs.  She denies any lightheadedness with standing, dyspnea on exertion, shortness of breath at rest, or peripheral edema.  White blood cells are noted to be elevated, which is likely secondary to prolonged course of dexamethasone.  She denies any fever, cough, or dysuria.   Review of Systems:        Review of Systems  Constitutional:  Positive for appetite change and fatigue. Negative for activity change, chills, diaphoresis, fever and unexpected weight change.  HENT:  Negative for mouth sores, nosebleeds, sore throat and trouble swallowing.   Respiratory:  Negative for cough and shortness of breath.   Cardiovascular:  Negative for chest pain, palpitations and leg swelling.  Gastrointestinal:  Positive for diarrhea. Negative for abdominal pain, blood in stool, constipation, nausea and vomiting.  Genitourinary:  Negative for dysuria and hematuria.  Neurological:  Negative for dizziness, light-headedness, numbness and headaches.  Psychiatric/Behavioral:  Negative for dysphoric mood and sleep disturbance. The patient is not nervous/anxious.      Past Medical History, Surgical history, Social history, and Family history were reviewed as documented elsewhere in chart, and were updated as appropriate.   Assessment & Plan:    1.  Chemotherapy-induced nausea and vomiting (CINV), delayed    - She has had severe CINV with prior cycles of chemotherapy and was unable to tolerate oral intake.  She had a little relief from Compazine and Zofran. - With most  recent cycle of chemotherapy (cycle #5), she was given prolonged course of oral dexamethasone and olanzapine, with significant improvement in her symptoms.  She did not have any nausea or vomiting during this most recent  cycle. - PLAN: We will reorder prophylactic CINV medications for her next cycle of chemotherapy, to include dexamethasone p.o. 8 mg daily (take in the mornings) on days 2-8, as well as olanzapine 5 mg nightly on days 1-8.  She is aware that she cannot take Xanax while she is taking olanzapine.  2.  Hypotension, resolved      - Antihypertensives were previously held due to hypotension in the setting of dehydration and GI fluid losses from diarrhea and vomiting - Blood pressure has improved, with BP today 153/76 - PLAN: Patient instructed to restart her metoprolol, but to continue to hold her amlodipine.  3.  Diarrhea, chemo-induced       - Diarrhea much less severe during this cycle of chemotherapy, reports 1-3 loose bowel movements daily - PLAN:  Continue Lomotil and Imodium as needed.  4.  Nutrition, hydration, and weight loss   - Weight today is 147 pounds, up 4 pounds from last week (steroids may be contributing to this) - Patient has not lost any weight since Cycle #5 of chemotherapy - She is taking Marinol  -Oral intake is improved, but she does not drink much water during the day - She is followed by dietitian - PLAN: Continue to encourage increased hydration and nutritional intake. - We will hold off on IV fluids for the time being and see how she does oral intake alone. - We will check labs and schedule for fluids if needed in 1 week.  5.  Locally advanced oligometastatic left breast cancer     - Primary oncologist is Dr. Delton Coombes - She received cycle #5 of TCHP (docetaxel + carboplatin + trastuzumab + pertuzumab) on 02/18/2022 - PLAN: Next scheduled appointment with Dr. Delton Coombes is 03/11/2022    Objective:   Physical Exam:     There were no vitals taken for this visit.   ECOG: 2   Physical Exam Constitutional:      Appearance: Normal appearance.     Comments: Loose skin noted with evidence of recent rapid weight loss  HENT:     Head: Normocephalic and atraumatic.      Mouth/Throat:     Mouth: Mucous membranes are moist.  Eyes:     Extraocular Movements: Extraocular movements intact.     Pupils: Pupils are equal, round, and reactive to light.  Cardiovascular:     Rate and Rhythm: Normal rate and regular rhythm.     Pulses: Normal pulses.     Heart sounds: Normal heart sounds.  Pulmonary:     Effort: Pulmonary effort is normal.     Breath sounds: Normal breath sounds. No rales.  Abdominal:     General: Bowel sounds are normal.     Palpations: Abdomen is soft.     Tenderness: There is no abdominal tenderness.  Musculoskeletal:        General: No swelling.     Right lower leg: No edema.     Left lower leg: No edema.  Lymphadenopathy:     Cervical: No cervical adenopathy.  Skin:    General: Skin is warm and dry.     Comments: Skin turgor improved.  Neurological:     General: No focal deficit present.     Mental Status: She is alert and oriented to person,  place, and time.  Psychiatric:        Mood and Affect: Mood normal.        Behavior: Behavior normal.     Lab Review:     Component Value Date/Time   NA 133 (L) 02/24/2022 1020   NA 142 01/19/2018 1224   K 4.7 02/24/2022 1020   CL 103 02/24/2022 1020   CO2 21 (L) 02/24/2022 1020   GLUCOSE 111 (H) 02/24/2022 1020   BUN 41 (H) 02/24/2022 1020   BUN 10 01/19/2018 1224   CREATININE 1.20 (H) 02/24/2022 1020   CALCIUM 9.7 02/24/2022 1020   PROT 7.0 02/24/2022 1020   PROT 6.6 03/09/2018 0758   ALBUMIN 3.7 02/24/2022 1020   ALBUMIN 4.1 03/09/2018 0758   AST 26 02/24/2022 1020   ALT 46 (H) 02/24/2022 1020   ALKPHOS 206 (H) 02/24/2022 1020   BILITOT 0.4 02/24/2022 1020   BILITOT 0.6 03/09/2018 0758   GFRNONAA 51 (L) 02/24/2022 1020   GFRAA 91 01/19/2018 1224       Component Value Date/Time   WBC 16.0 (H) 02/24/2022 1020   RBC 2.87 (L) 02/24/2022 1020   HGB 9.4 (L) 02/24/2022 1020   HCT 30.5 (L) 02/24/2022 1020   PLT 183 02/24/2022 1020   MCV 106.3 (H) 02/24/2022 1020   MCH  32.8 02/24/2022 1020   MCHC 30.8 02/24/2022 1020   RDW 18.4 (H) 02/24/2022 1020   LYMPHSABS 1.2 02/24/2022 1020   MONOABS 0.9 02/24/2022 1020   EOSABS 0.1 02/24/2022 1020   BASOSABS 0.0 02/24/2022 1020   -------------------------------  Imaging from last 24 hours (if applicable):  Radiology interpretation: CT Chest W Contrast  Result Date: 02/04/2022 CLINICAL DATA:  Metastatic breast cancer, pulmonary nodules recently diagnosed left breast cancer, ongoing chemotherapy * Tracking Code: BO * EXAM: CT CHEST WITH CONTRAST TECHNIQUE: Multidetector CT imaging of the chest was performed during intravenous contrast administration. RADIATION DOSE REDUCTION: This exam was performed according to the departmental dose-optimization program which includes automated exposure control, adjustment of the mA and/or kV according to patient size and/or use of iterative reconstruction technique. CONTRAST:  22m OMNIPAQUE IOHEXOL 300 MG/ML  SOLN COMPARISON:  CT abdomen pelvis, 01/05/2022, PET-CT, 10/22/2021 FINDINGS: Cardiovascular: Right chest port catheter. Aortic atherosclerosis. Normal heart size. Three-vessel coronary artery calcifications. No pericardial effusion. Mediastinum/Nodes: Interval decrease in size of multiple previously enlarged and hypermetabolic left axillary and subpectoral lymph nodes, index subpectoral node measuring no greater than 0.6 cm, previously 1.8 x 1.2 cm (series 2, image 32). No persistently enlarged mediastinal, hilar, or axillary lymph nodes. Thyroid gland, trachea, and esophagus demonstrate no significant findings. Lungs/Pleura: Moderate centrilobular emphysema. Interval decrease in size of multiple small bilateral pulmonary nodules, the largest of which were previously hypermetabolic. A nodule of the peripheral left upper lobe measures 0.3 cm, previously 0.7 cm (series 4, image 43), and a nodule of the anterior left upper lobe measures 0.5 cm, previously 0.7 cm (series 4, image 36).  Nodule of the right lower lobe is difficult to appreciate but remnant measures no greater than 0.3 cm (series 4, image 98). Additional rounded nodule of the dependent right lower lobe is unchanged measuring 0.5 cm (series 4, image 83). Small triangular nodular lymph node of the medial right upper lobe is unchanged measuring 0.4 cm (series 4, image 31). No pleural effusion or pneumothorax. Upper Abdomen: No acute abnormality. Musculoskeletal: Partially imaged mass of the left breast appears diminished in size with overlying skin thickening (series 2, image 70). No  suspicious osseous lesions identified. IMPRESSION: 1. Interval decrease in size of multiple previously enlarged and hypermetabolic left axillary and subpectoral lymph nodes. 2. Partially imaged left breast mass with overlying skin thickening appears diminished in size. 3. Interval decrease in size of multiple small bilateral pulmonary nodules, the largest of which were previously hypermetabolic. 4. Constellation of findings is consistent with treatment response of primary breast malignancy with nodal and pulmonary metastatic disease. 5. Emphysema. 6. Coronary artery disease. Aortic Atherosclerosis (ICD10-I70.0) and Emphysema (ICD10-J43.9). Electronically Signed   By: Delanna Ahmadi M.D.   On: 02/04/2022 16:26      Wrap-Up:    All questions were answered. The patient knows to call the clinic with any problems, questions or concerns.  Medical decision making: Moderate         Time spent on visit: I spent 25 minutes counseling the patient face to face. The total time spent in the appointment was 40 minutes and more than 50% was on counseling.   Harriett Rush, PA-C 02/26/2022 11:59 PM

## 2022-02-25 NOTE — Progress Notes (Signed)
Per R. Pennington PA place patient on the schedule for port flush lab appointment 02-26-2022 cbc/d, Magnesium, CMP prior to appointment and PA will let us know what the plan of care moving forward is for IV fluids. Scheduling notified.

## 2022-02-25 NOTE — Progress Notes (Signed)
Patient presents today for one liter of saline and 2 grams of magnesium fluids per providers order.  Vital signs WNL.  Patient has no new complaints at this time.  One liter of  Normal Saline and 2 grams magnesium given today per MD orders.  Tolerated infusion without adverse affects.  Vital signs stable.  No complaints at this time.  Discharge from clinic ambulatory in stable condition.  Alert and oriented X 3.  Follow up with [facility] as scheduled.

## 2022-02-25 NOTE — Addendum Note (Signed)
Addended by: Benjiman Core D on: 02/25/2022 01:14 PM   Modules accepted: Orders

## 2022-02-26 ENCOUNTER — Inpatient Hospital Stay (HOSPITAL_COMMUNITY): Payer: Managed Care, Other (non HMO)

## 2022-02-26 ENCOUNTER — Inpatient Hospital Stay (HOSPITAL_BASED_OUTPATIENT_CLINIC_OR_DEPARTMENT_OTHER): Payer: Managed Care, Other (non HMO) | Admitting: Physician Assistant

## 2022-02-26 ENCOUNTER — Encounter (HOSPITAL_COMMUNITY): Payer: Self-pay | Admitting: Hematology

## 2022-02-26 DIAGNOSIS — T451X5A Adverse effect of antineoplastic and immunosuppressive drugs, initial encounter: Secondary | ICD-10-CM

## 2022-02-26 DIAGNOSIS — E860 Dehydration: Secondary | ICD-10-CM | POA: Diagnosis not present

## 2022-02-26 DIAGNOSIS — Z170 Estrogen receptor positive status [ER+]: Secondary | ICD-10-CM

## 2022-02-26 DIAGNOSIS — C50812 Malignant neoplasm of overlapping sites of left female breast: Secondary | ICD-10-CM

## 2022-02-26 DIAGNOSIS — F419 Anxiety disorder, unspecified: Secondary | ICD-10-CM | POA: Diagnosis not present

## 2022-02-26 DIAGNOSIS — R112 Nausea with vomiting, unspecified: Secondary | ICD-10-CM | POA: Diagnosis not present

## 2022-02-26 DIAGNOSIS — Z95828 Presence of other vascular implants and grafts: Secondary | ICD-10-CM

## 2022-02-26 LAB — COMPREHENSIVE METABOLIC PANEL
ALT: 40 U/L (ref 0–44)
AST: 19 U/L (ref 15–41)
Albumin: 3.7 g/dL (ref 3.5–5.0)
Alkaline Phosphatase: 203 U/L — ABNORMAL HIGH (ref 38–126)
Anion gap: 4 — ABNORMAL LOW (ref 5–15)
BUN: 35 mg/dL — ABNORMAL HIGH (ref 8–23)
CO2: 22 mmol/L (ref 22–32)
Calcium: 9.7 mg/dL (ref 8.9–10.3)
Chloride: 108 mmol/L (ref 98–111)
Creatinine, Ser: 1.14 mg/dL — ABNORMAL HIGH (ref 0.44–1.00)
GFR, Estimated: 54 mL/min — ABNORMAL LOW (ref >60)
Glucose, Bld: 116 mg/dL — ABNORMAL HIGH (ref 70–99)
Potassium: 4 mmol/L (ref 3.5–5.1)
Sodium: 134 mmol/L — ABNORMAL LOW (ref 135–145)
Total Bilirubin: 0.2 mg/dL — ABNORMAL LOW (ref 0.3–1.2)
Total Protein: 6.9 g/dL (ref 6.5–8.1)

## 2022-02-26 LAB — CBC WITH DIFFERENTIAL/PLATELET
Abs Immature Granulocytes: 0.7 10*3/uL — ABNORMAL HIGH (ref 0.00–0.07)
Band Neutrophils: 20 %
Basophils Absolute: 0 10*3/uL (ref 0.0–0.1)
Basophils Relative: 0 %
Eosinophils Absolute: 0 10*3/uL (ref 0.0–0.5)
Eosinophils Relative: 0 %
HCT: 28.9 % — ABNORMAL LOW (ref 36.0–46.0)
Hemoglobin: 9 g/dL — ABNORMAL LOW (ref 12.0–15.0)
Lymphocytes Relative: 20 %
Lymphs Abs: 4.4 10*3/uL — ABNORMAL HIGH (ref 0.7–4.0)
MCH: 32.8 pg (ref 26.0–34.0)
MCHC: 31.1 g/dL (ref 30.0–36.0)
MCV: 105.5 fL — ABNORMAL HIGH (ref 80.0–100.0)
Metamyelocytes Relative: 3 %
Monocytes Absolute: 0.4 10*3/uL (ref 0.1–1.0)
Monocytes Relative: 2 %
Neutro Abs: 16.7 10*3/uL — ABNORMAL HIGH (ref 1.7–7.7)
Neutrophils Relative %: 55 %
Platelets: 179 10*3/uL (ref 150–400)
RBC: 2.74 MIL/uL — ABNORMAL LOW (ref 3.87–5.11)
RDW: 18.6 % — ABNORMAL HIGH (ref 11.5–15.5)
WBC: 22.2 10*3/uL — ABNORMAL HIGH (ref 4.0–10.5)
nRBC: 0.3 % — ABNORMAL HIGH (ref 0.0–0.2)

## 2022-02-26 LAB — MAGNESIUM: Magnesium: 2 mg/dL (ref 1.7–2.4)

## 2022-02-26 MED ORDER — HEPARIN SOD (PORK) LOCK FLUSH 100 UNIT/ML IV SOLN
500.0000 [IU] | Freq: Once | INTRAVENOUS | Status: AC
  Administered 2022-02-26: 500 [IU] via INTRAVENOUS

## 2022-02-26 MED ORDER — ALPRAZOLAM 0.25 MG PO TABS: 0.2500 mg | ORAL_TABLET | Freq: Two times a day (BID) | ORAL | 2 refills | Status: DC | PRN

## 2022-02-26 MED ORDER — OLANZAPINE 5 MG PO TABS: 5.0000 mg | ORAL_TABLET | Freq: Every day | ORAL | 0 refills | Status: DC

## 2022-02-26 MED ORDER — DEXAMETHASONE 4 MG PO TABS: 8.0000 mg | ORAL_TABLET | Freq: Every day | ORAL | 0 refills | Status: DC

## 2022-02-26 MED ORDER — SODIUM CHLORIDE 0.9% FLUSH
10.0000 mL | INTRAVENOUS | Status: DC | PRN
  Administered 2022-02-26: 10 mL via INTRAVENOUS

## 2022-02-26 NOTE — Patient Instructions (Signed)
[facility]  Discharge Instructions: Thank you for choosing [facility] to provide your oncology and hematology care.  If you have a lab appointment with the Cancer Center, please come in thru the Main Entrance and check in at the main information desk.  Wear comfortable clothing and clothing appropriate for easy access to any Portacath or PICC line.   We strive to give you quality time with your provider. You may need to reschedule your appointment if you arrive late (15 or more minutes).  Arriving late affects you and other patients whose appointments are after yours.  Also, if you miss three or more appointments without notifying the office, you may be dismissed from the clinic at the provider's discretion.      For prescription refill requests, have your pharmacy contact our office and allow 72 hours for refills to be completed.         To help prevent nausea and vomiting after your treatment, we encourage you to take your nausea medication as directed.  BELOW ARE SYMPTOMS THAT SHOULD BE REPORTED IMMEDIATELY: *FEVER GREATER THAN 100.4 F (38 C) OR HIGHER *CHILLS OR SWEATING *NAUSEA AND VOMITING THAT IS NOT CONTROLLED WITH YOUR NAUSEA MEDICATION *UNUSUAL SHORTNESS OF BREATH *UNUSUAL BRUISING OR BLEEDING *URINARY PROBLEMS (pain or burning when urinating, or frequent urination) *BOWEL PROBLEMS (unusual diarrhea, constipation, pain near the anus) TENDERNESS IN MOUTH AND THROAT WITH OR WITHOUT PRESENCE OF ULCERS (sore throat, sores in mouth, or a toothache) UNUSUAL RASH, SWELLING OR PAIN  UNUSUAL VAGINAL DISCHARGE OR ITCHING   Items with * indicate a potential emergency and should be followed up as soon as possible or go to the Emergency Department if any problems should occur.  Please show the CHEMOTHERAPY ALERT CARD or IMMUNOTHERAPY ALERT CARD at check-in to the Emergency Department and triage nurse.  Should you have questions after your visit or need to  cancel or reschedule your appointment, please contact [facility] [phone]  and follow the prompts.  Office hours are 8:00 a.m. to 4:30 p.m. Monday - Friday. Please note that voicemails left after 4:00 p.m. may not be returned until the following business day.  We are closed weekends and major holidays. You have access to a nurse at all times for urgent questions. Please call the main number to the clinic [phone] and follow the prompts.  For any non-urgent questions, you may also contact your provider using MyChart. We now offer e-Visits for anyone 18 and older to request care online for non-urgent symptoms. For details visit [email or web address].   Also download the MyChart app! Go to the app store, search "MyChart", open the app, select [facility], and log in with your MyChart username and password.  Masks are optional in the cancer centers. If you would like for your care team to wear a mask while they are taking care of you, please let them know. For doctor visits, patients may have with them one support person who is at least 65 years old. At this time, visitors are not allowed in the infusion area.  

## 2022-02-26 NOTE — Patient Instructions (Addendum)
[place] at [facility] Discharge Instructions  You were seen today by Tarri Abernethy PA-C for your chemotherapy symptom management visit.    Your labs looked relatively good today - no need for IV fluids. Please work on drinking water at home, at least 32 to 48 ounces daily in addition to your other fluids (milk, Gatorade, etc.) We will check LABS NEXT WEEK to see if you need any fluids at that time.  If you have any recurrent nausea, you can take Zofran 8 mg orally dissolving tablet (placed under tongue to dissolve in mouth, rather than swallowed) every 8 hours.  If you have worsening diarrhea, you can take  Imodium after each episode of diarrhea and Lomotil x2 tablets up to 4 times daily.  Continue to drink Ensure and eat small meals as much as you are able to.  Refill prescription for Xanax has been sent to your pharmacy to help with insomnia and anxiety.  DO NOT TAKE THIS MEDICATION WHILE YOU ARE ALSO TAKING OLANZAPINE.  You can restart your Lopressor (metoprolol tartrate 25 mg twice daily) blood pressure medication.  Continue to check your blood pressure at home, and if it is lower than 100/60, please call our office.    For your next cycle of chemotherapy (starting on Thursday, 03/11/2022), we will refill the medications to prevent delayed nausea and vomiting.  Prescription will be sent to your pharmacy today so that you can have these medications ready, but do NOT take them until the following days: Dexamethasone 8 mg: Take this pill in the MORNINGS x7 days, starting with the first day after chemotherapy (Friday 7/21) Olanzapine 5 mg: Take this pill AT BEDTIME x8 days, starting the same evening as your next cycle of chemotherapy (Thursday 7/20)  - - - - - - - - - - - - - - -    Thank you for choosing [place] at [facility] to provide your oncology and hematology care.  To afford each patient quality time with our provider, please  arrive at least 15 minutes before your scheduled appointment time.   If you have a lab appointment with the [place] please come in thru the Main Entrance and check in at the main information desk.  You need to re-schedule your appointment should you arrive 10 or more minutes late.  We strive to give you quality time with our providers, and arriving late affects you and other patients whose appointments are after yours.  Also, if you no show three or more times for appointments you may be dismissed from the clinic at the providers discretion.     Again, thank you for choosing [facility].  Our hope is that these requests will decrease the amount of time that you wait before being seen by our physicians.       _____________________________________________________________  Should you have questions after your visit to [facility], please contact our office at [phone] and follow the prompts.  Our office hours are 8:00 a.m. and 4:30 p.m. Monday - Friday.  Please note that voicemails left after 4:00 p.m. may not be returned until the following business day.  We are closed weekends and major holidays.  You do have access to a nurse 24-7, just call the main number to the clinic [phone] and do not press any options, hold on the line and a nurse will answer the phone.    For prescription refill  requests, have your pharmacy contact our office and allow 72 hours.    Due to Covid, you will need to wear a mask upon entering the hospital. If you do not have a mask, a mask will be given to you at the Main Entrance upon arrival. For doctor visits, patients may have 1 support person age 33 or older with them. For treatment visits, patients can not have anyone with them due to social distancing guidelines and our immunocompromised population.

## 2022-02-26 NOTE — Progress Notes (Signed)
No fluids today per Tarri Abernethy, PA-C.  Patient's port deaccessed.  Good blood return noted with no difficulty flushing.  Patient left ambulatory in stable condition.

## 2022-02-26 NOTE — Progress Notes (Signed)
Patients port flushed without difficulty.  Good blood return noted with no bruising or swelling noted at site.  Stable during blood draw.  Patient remaining accessed for possible fluids.

## 2022-02-27 ENCOUNTER — Encounter (HOSPITAL_COMMUNITY): Payer: Self-pay | Admitting: Hematology

## 2022-03-03 ENCOUNTER — Inpatient Hospital Stay (HOSPITAL_COMMUNITY): Payer: Managed Care, Other (non HMO)

## 2022-03-03 ENCOUNTER — Inpatient Hospital Stay (HOSPITAL_COMMUNITY): Payer: Managed Care, Other (non HMO) | Admitting: Hematology

## 2022-03-03 ENCOUNTER — Other Ambulatory Visit (HOSPITAL_COMMUNITY): Payer: Self-pay | Admitting: *Deleted

## 2022-03-04 ENCOUNTER — Inpatient Hospital Stay (HOSPITAL_COMMUNITY): Payer: Managed Care, Other (non HMO)

## 2022-03-04 ENCOUNTER — Inpatient Hospital Stay (HOSPITAL_BASED_OUTPATIENT_CLINIC_OR_DEPARTMENT_OTHER): Payer: Managed Care, Other (non HMO) | Admitting: Physician Assistant

## 2022-03-04 ENCOUNTER — Encounter (HOSPITAL_COMMUNITY): Payer: Self-pay

## 2022-03-04 ENCOUNTER — Encounter (HOSPITAL_COMMUNITY): Payer: Self-pay | Admitting: Physician Assistant

## 2022-03-04 ENCOUNTER — Other Ambulatory Visit (HOSPITAL_COMMUNITY): Payer: Self-pay | Admitting: Physician Assistant

## 2022-03-04 ENCOUNTER — Ambulatory Visit (HOSPITAL_COMMUNITY)
Admission: RE | Admit: 2022-03-04 | Discharge: 2022-03-04 | Disposition: A | Discharge status: Home / Self Care | Payer: Managed Care, Other (non HMO) | Source: Ambulatory Visit | Attending: Physician Assistant | Admitting: Physician Assistant

## 2022-03-04 DIAGNOSIS — E860 Dehydration: Secondary | ICD-10-CM

## 2022-03-04 DIAGNOSIS — R2242 Localized swelling, mass and lump, left lower limb: Secondary | ICD-10-CM

## 2022-03-04 DIAGNOSIS — I82412 Acute embolism and thrombosis of left femoral vein: Secondary | ICD-10-CM

## 2022-03-04 DIAGNOSIS — C50812 Malignant neoplasm of overlapping sites of left female breast: Secondary | ICD-10-CM

## 2022-03-04 LAB — CBC WITH DIFFERENTIAL/PLATELET
Abs Immature Granulocytes: 0.27 10*3/uL — ABNORMAL HIGH (ref 0.00–0.07)
Basophils Absolute: 0 10*3/uL (ref 0.0–0.1)
Basophils Relative: 0 %
Eosinophils Absolute: 0 10*3/uL (ref 0.0–0.5)
Eosinophils Relative: 0 %
HCT: 28.2 % — ABNORMAL LOW (ref 36.0–46.0)
Hemoglobin: 8.9 g/dL — ABNORMAL LOW (ref 12.0–15.0)
Immature Granulocytes: 2 %
Lymphocytes Relative: 12 %
Lymphs Abs: 1.3 10*3/uL (ref 0.7–4.0)
MCH: 33.7 pg (ref 26.0–34.0)
MCHC: 31.6 g/dL (ref 30.0–36.0)
MCV: 106.8 fL — ABNORMAL HIGH (ref 80.0–100.0)
Monocytes Absolute: 0.7 10*3/uL (ref 0.1–1.0)
Monocytes Relative: 6 %
Neutro Abs: 8.9 10*3/uL — ABNORMAL HIGH (ref 1.7–7.7)
Neutrophils Relative %: 80 %
Platelets: 153 10*3/uL (ref 150–400)
RBC: 2.64 MIL/uL — ABNORMAL LOW (ref 3.87–5.11)
RDW: 19.8 % — ABNORMAL HIGH (ref 11.5–15.5)
WBC: 11.3 10*3/uL — ABNORMAL HIGH (ref 4.0–10.5)
nRBC: 0 % (ref 0.0–0.2)

## 2022-03-04 LAB — COMPREHENSIVE METABOLIC PANEL
ALT: 35 U/L (ref 0–44)
AST: 28 U/L (ref 15–41)
Albumin: 3.3 g/dL — ABNORMAL LOW (ref 3.5–5.0)
Alkaline Phosphatase: 152 U/L — ABNORMAL HIGH (ref 38–126)
Anion gap: 4 — ABNORMAL LOW (ref 5–15)
BUN: 20 mg/dL (ref 8–23)
CO2: 22 mmol/L (ref 22–32)
Calcium: 8.9 mg/dL (ref 8.9–10.3)
Chloride: 109 mmol/L (ref 98–111)
Creatinine, Ser: 1.04 mg/dL — ABNORMAL HIGH (ref 0.44–1.00)
GFR, Estimated: >60 mL/min (ref >60)
Glucose, Bld: 116 mg/dL — ABNORMAL HIGH (ref 70–99)
Potassium: 4.2 mmol/L (ref 3.5–5.1)
Sodium: 135 mmol/L (ref 135–145)
Total Bilirubin: 0.6 mg/dL (ref 0.3–1.2)
Total Protein: 6.3 g/dL — ABNORMAL LOW (ref 6.5–8.1)

## 2022-03-04 LAB — D-DIMER, QUANTITATIVE: D-Dimer, Quant: 1.39 ug/mL-FEU — ABNORMAL HIGH (ref 0.00–0.50)

## 2022-03-04 LAB — MAGNESIUM: Magnesium: 1.2 mg/dL — ABNORMAL LOW (ref 1.7–2.4)

## 2022-03-04 MED ORDER — ELIQUIS DVT/PE STARTER PACK 5 MG PO TBPK: ORAL_TABLET | ORAL | 0 refills | Status: DC

## 2022-03-04 MED ORDER — MAGNESIUM SULFATE 2 GM/50ML IV SOLN
2.0000 g | INTRAVENOUS | Status: AC
  Administered 2022-03-04 (×2): 2 g via INTRAVENOUS
  Filled 2022-03-04: qty 50

## 2022-03-04 MED ORDER — ASPIRIN 81 MG PO TBEC: 81.0000 mg | DELAYED_RELEASE_TABLET | Freq: Every day | ORAL | 3 refills | Status: AC

## 2022-03-04 MED ORDER — HEPARIN SOD (PORK) LOCK FLUSH 100 UNIT/ML IV SOLN
500.0000 [IU] | Freq: Once | INTRAVENOUS | Status: AC
  Administered 2022-03-04: 500 [IU] via INTRAVENOUS

## 2022-03-04 MED ORDER — SODIUM CHLORIDE 0.9% FLUSH
10.0000 mL | Freq: Once | INTRAVENOUS | Status: AC
  Administered 2022-03-04: 10 mL via INTRAVENOUS

## 2022-03-04 MED ORDER — SODIUM CHLORIDE 0.9 % IV SOLN: INTRAVENOUS | Status: DC

## 2022-03-04 NOTE — Progress Notes (Signed)
[place] [place] [address] Phone: [phone] Fax: [place] [phone] 1956-01-30 66 y.o.   Subjective:  Chief Complaint: Left leg swelling and pain  Traci Fuller is managed by Dr. Delton Coombes for oligometastatic breast cancer, currently on [facility] regimen every 21 days.  Patient reports that 2 days ago she woke up with an area on the medial aspect of her proximal left calf that was red, warm, and tender to the touch.  She had trace left lower extremity edema.  Symptoms have been unchanged over the past 2 days.  She denied any inciting injury.  This is concerning for acute DVT, especially given her known metastatic disease.  Otherwise, she reports that she has been doing better than previously.  She only had 1 day of diarrhea this week (Tuesday, 03/02/2022).  She denies any nausea or vomiting.  She still has poor appetite, but is forcing herself to eat and drink and is seeing improvement in these areas.  Over the past few days, she has started to become more active at home, and has been resting in bed <50% of the day.  She reports 25% energy and 50% appetite.   Review of Systems:  Review of Systems  Constitutional:  Positive for appetite change and fatigue. Negative for activity change, chills, diaphoresis, fever and unexpected weight change.  HENT:  Negative for mouth sores, nosebleeds, sore throat and trouble swallowing.   Respiratory:  Negative for cough and shortness of breath.   Cardiovascular:  Positive for leg swelling (left). Negative for chest pain and palpitations.  Gastrointestinal:  Positive for diarrhea (x1 day, resovled). Negative for abdominal pain, blood in stool, constipation, nausea and vomiting.  Genitourinary:  Negative for dysuria and hematuria.  Musculoskeletal:  Positive for myalgias (left calf pain).  Neurological:  Negative for dizziness,  light-headedness, numbness and headaches.  Psychiatric/Behavioral:  Negative for dysphoric mood and sleep disturbance. The patient is not nervous/anxious.     Past Medical History, Surgical history, Social history, and Family history were reviewed as documented elsewhere in chart, and were updated as appropriate.   Assessment & Plan:    1.  Acute left lower extremity DVT of common femoral vein - Onset of left calf erythema, tenderness, and warmth on 03/02/2022 - D-dimer elevated at 1.39 - Sent for stat venous ultrasound left lower extremity (03/04/2022): Left greater saphenous vein thrombophlebitis with propagation into proximal common femoral vein, positive for DVT within the left lower extremity involving common femoral vein. - DVT considered provoked in the setting of malignancy and increased sedentarism secondary to cancer treatment.  No indication for hypercoagulable work-up. - Patient has been on Brilinta for the past 4 years secondary to coronary artery stenting in May 2019. - PLAN: Prescription sent to pharmacy for Eliquis 10 mg twice daily x7 days, followed by Eliquis 5 mg twice daily thereafter.  Patient will require indefinite treatment given her known metastatic disease. - Discussed with patient's cardiologist (Dr. Gwenlyn Found), who recommends discontinuing Brilinta at this time, will continue on aspirin 81 mg daily along with Eliquis. - Patient educated on bleeding precautions and when to seek immediate medical attention. - Patient educated on signs/symptoms of PE and knows to seek immediate medical attention should these occur.   Objective:   Physical Exam:  There were no vitals taken for this visit. ECOG: 2  Physical Exam Vitals reviewed.  Constitutional:  Appearance: Normal appearance.  Cardiovascular:     Rate and Rhythm: Normal rate and regular rhythm.     Pulses: Normal pulses.     Heart sounds: Normal heart sounds.  Pulmonary:     Effort: Pulmonary effort is normal.      Breath sounds: Normal breath sounds.  Musculoskeletal:        General: Tenderness (Erythema, warmth, tenderness on medial aspect of left proximal calf) present.     Left lower leg: Edema (Trace edema LLE) present.  Neurological:     General: No focal deficit present.     Mental Status: She is alert and oriented to person, place, and time.  Psychiatric:        Mood and Affect: Mood normal.        Behavior: Behavior normal.     Lab Review:     Component Value Date/Time   NA 135 03/04/2022 0851   NA 142 01/19/2018 1224   K 4.2 03/04/2022 0851   CL 109 03/04/2022 0851   CO2 22 03/04/2022 0851   GLUCOSE 116 (H) 03/04/2022 0851   BUN 20 03/04/2022 0851   BUN 10 01/19/2018 1224   CREATININE 1.04 (H) 03/04/2022 0851   CALCIUM 8.9 03/04/2022 0851   PROT 6.3 (L) 03/04/2022 0851   PROT 6.6 03/09/2018 0758   ALBUMIN 3.3 (L) 03/04/2022 0851   ALBUMIN 4.1 03/09/2018 0758   AST 28 03/04/2022 0851   ALT 35 03/04/2022 0851   ALKPHOS 152 (H) 03/04/2022 0851   BILITOT 0.6 03/04/2022 0851   BILITOT 0.6 03/09/2018 0758   GFRNONAA >60 03/04/2022 0851   GFRAA 91 01/19/2018 1224       Component Value Date/Time   WBC 11.3 (H) 03/04/2022 0851   RBC 2.64 (L) 03/04/2022 0851   HGB 8.9 (L) 03/04/2022 0851   HCT 28.2 (L) 03/04/2022 0851   PLT 153 03/04/2022 0851   MCV 106.8 (H) 03/04/2022 0851   MCH 33.7 03/04/2022 0851   MCHC 31.6 03/04/2022 0851   RDW 19.8 (H) 03/04/2022 0851   LYMPHSABS 1.3 03/04/2022 0851   MONOABS 0.7 03/04/2022 0851   EOSABS 0.0 03/04/2022 0851   BASOSABS 0.0 03/04/2022 0851   -------------------------------  Imaging from last 24 hours (if applicable):  Radiology interpretation: US Venous Img Lower Unilateral Left  Result Date: 03/04/2022 CLINICAL DATA:  LEFT leg pain, redness and swelling x2 days. EXAM: LEFT LOWER EXTREMITY VENOUS DOPPLER ULTRASOUND TECHNIQUE: Gray-scale sonography with compression, as well as color and duplex ultrasound, were performed to  evaluate the deep venous system(s) from the level of the common femoral vein through the popliteal and proximal calf veins. COMPARISON:  CT AP, 01/05/2022. FINDINGS: VENOUS Heterogeneously hypoechoic filling defect with resulting lack of coaptation involving the LEFT GSV, from the level of the knee through the saphenofemoral junction. Thrombus extension from the GSV at the [facility] to involve the proximal common femoral vein. Additional thrombus within 1 of the paired peroneal veins. Normal compressibility of the LEFT superficial femoral, and popliteal veins, as well as the visualized calf veins. Visualized portions of the profunda femoral vein is unremarkable. Doppler waveforms show normal direction of venous flow, normal respiratory plasticity and response to augmentation. Limited views of the contralateral common femoral vein are unremarkable. OTHER No abnormal fluid collection. Limitations: none IMPRESSION: 1. 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 2. Examination is POSITIVE for DVT within the LEFT lower extremity involving the CFV. Electronically Signed   By: Wille Glaser  Mugweru M.D.   On: 03/04/2022 10:55   CT Chest W Contrast  Result Date: 02/04/2022 CLINICAL DATA:  Metastatic breast cancer, pulmonary nodules recently diagnosed left breast cancer, ongoing chemotherapy * Tracking Code: BO * EXAM: CT CHEST WITH CONTRAST TECHNIQUE: Multidetector CT imaging of the chest was performed during intravenous contrast administration. RADIATION DOSE REDUCTION: This exam was performed according to the departmental dose-optimization program which includes automated exposure control, adjustment of the mA and/or kV according to patient size and/or use of iterative reconstruction technique. CONTRAST:  42m OMNIPAQUE IOHEXOL 300 MG/ML  SOLN COMPARISON:  CT abdomen pelvis, 01/05/2022, PET-CT, 10/22/2021 FINDINGS: Cardiovascular: Right chest port catheter. Aortic atherosclerosis. Normal heart  size. Three-vessel coronary artery calcifications. No pericardial effusion. Mediastinum/Nodes: Interval decrease in size of multiple previously enlarged and hypermetabolic left axillary and subpectoral lymph nodes, index subpectoral node measuring no greater than 0.6 cm, previously 1.8 x 1.2 cm (series 2, image 32). No persistently enlarged mediastinal, hilar, or axillary lymph nodes. Thyroid gland, trachea, and esophagus demonstrate no significant findings. Lungs/Pleura: Moderate centrilobular emphysema. Interval decrease in size of multiple small bilateral pulmonary nodules, the largest of which were previously hypermetabolic. A nodule of the peripheral left upper lobe measures 0.3 cm, previously 0.7 cm (series 4, image 43), and a nodule of the anterior left upper lobe measures 0.5 cm, previously 0.7 cm (series 4, image 36). Nodule of the right lower lobe is difficult to appreciate but remnant measures no greater than 0.3 cm (series 4, image 98). Additional rounded nodule of the dependent right lower lobe is unchanged measuring 0.5 cm (series 4, image 83). Small triangular nodular lymph node of the medial right upper lobe is unchanged measuring 0.4 cm (series 4, image 31). No pleural effusion or pneumothorax. Upper Abdomen: No acute abnormality. Musculoskeletal: Partially imaged mass of the left breast appears diminished in size with overlying skin thickening (series 2, image 70). No suspicious osseous lesions identified. IMPRESSION: 1. Interval decrease in size of multiple previously enlarged and hypermetabolic left axillary and subpectoral lymph nodes. 2. Partially imaged left breast mass with overlying skin thickening appears diminished in size. 3. Interval decrease in size of multiple small bilateral pulmonary nodules, the largest of which were previously hypermetabolic. 4. Constellation of findings is consistent with treatment response of primary breast malignancy with nodal and pulmonary metastatic disease.  5. Emphysema. 6. Coronary artery disease. Aortic Atherosclerosis (ICD10-I70.0) and Emphysema (ICD10-J43.9). Electronically Signed   By: ADelanna AhmadiM.D.   On: 02/04/2022 16:26      Wrap-Up:    All questions were answered. The patient knows to call the clinic with any problems, questions or concerns.  Medical decision making: Moderate  Time spent on visit: I spent 20 minutes counseling the patient face to face. The total time spent in the appointment was 30 minutes and more than 50% was on counseling.   RHarriett Rush PA-C  03/04/2022 7:24 PM

## 2022-03-04 NOTE — Progress Notes (Signed)
Brief visit with patient today due to left leg soreness. She has area of approximately 5 cm swelling, erythema, and warmth on medial aspect of her proximal left calf.  Trace edema of left lower extremity.  Onset of symptoms 2 days ago.  Denies any inciting injury. Due to active malignancy, we will check stat venous ultrasound left lower extremity to rule out DVT.

## 2022-03-04 NOTE — Progress Notes (Signed)
Results noted.  Patient is in clinic and will be added for symptom management visit to discuss plan.

## 2022-03-04 NOTE — Progress Notes (Signed)
Patient stated two days a go a knot in her left calf appeared with warmth to touch and tenderness.  She is also having tenderness moving into her thigh.  Area tender to touch with palpation and Tarri Abernethy, PA notified with [facility] labs added for today's port draw.

## 2022-03-04 NOTE — Patient Instructions (Signed)
[place]  Discharge Instructions: Thank you for choosing [place] to provide your oncology and hematology care.  If you have a lab appointment with the [facility], please come in thru the Main Entrance and check in at the main information desk.  Wear comfortable clothing and clothing appropriate for easy access to any Portacath or PICC line.   We strive to give you quality time with your provider. You may need to reschedule your appointment if you arrive late (15 or more minutes).  Arriving late affects you and other patients whose appointments are after yours.  Also, if you miss three or more appointments without notifying the office, you may be dismissed from the clinic at the provider's discretion.      For prescription refill requests, have your pharmacy contact our office and allow 72 hours for refills to be completed.    Today you received the following chemotherapy and/or immunotherapy agents 4 Grams Magnesium      To help prevent nausea and vomiting after your treatment, we encourage you to take your nausea medication as directed.  BELOW ARE SYMPTOMS THAT SHOULD BE REPORTED IMMEDIATELY: *FEVER GREATER THAN 100.4 F (38 C) OR HIGHER *CHILLS OR SWEATING *NAUSEA AND VOMITING THAT IS NOT CONTROLLED WITH YOUR NAUSEA MEDICATION *UNUSUAL SHORTNESS OF BREATH *UNUSUAL BRUISING OR BLEEDING *URINARY PROBLEMS (pain or burning when urinating, or frequent urination) *BOWEL PROBLEMS (unusual diarrhea, constipation, pain near the anus) TENDERNESS IN MOUTH AND THROAT WITH OR WITHOUT PRESENCE OF ULCERS (sore throat, sores in mouth, or a toothache) UNUSUAL RASH, SWELLING OR PAIN  UNUSUAL VAGINAL DISCHARGE OR ITCHING   Items with * indicate a potential emergency and should be followed up as soon as possible or go to the Emergency Department if any problems should occur.  Please show the CHEMOTHERAPY ALERT CARD or IMMUNOTHERAPY ALERT CARD at check-in to the  Emergency Department and triage nurse.  Should you have questions after your visit or need to cancel or reschedule your appointment, please contact [facility] [phone]  and follow the prompts.  Office hours are 8:00 a.m. to 4:30 p.m. Monday - Friday. Please note that voicemails left after 4:00 p.m. may not be returned until the following business day.  We are closed weekends and major holidays. You have access to a nurse at all times for urgent questions. Please call the main number to the clinic [phone] and follow the prompts.  For any non-urgent questions, you may also contact your provider using MyChart. We now offer e-Visits for anyone 33 and older to request care online for non-urgent symptoms. For details visit [email or web address].   Also download the MyChart app! Go to the app store, search "MyChart", open the app, select [place], and log in with your MyChart username and password.  Masks are optional in the cancer centers. If you would like for your care team to wear a mask while they are taking care of you, please let them know. For doctor visits, patients may have with them one support person who is at least 65 years old. At this time, visitors are not allowed in the infusion area.

## 2022-03-04 NOTE — Patient Instructions (Signed)
[place] at [facility] Discharge Instructions  You were seen today by Tarri Abernethy PA-C for your new blood clot in your left leg.  LEFT LEG BLOOD CLOT: You have a blood clot known as a "deep vein thrombosis" (commonly called a DVT) in your left leg. Blood clots can sometimes occur in patients with cancer, since cancer can cause blood to be more likely to clot. We will treat this blood clot with a blood thinner called ELIQUIS (apixaban). For the next 7 days, you will take Eliquis 10 mg (2 tablets) twice daily.  After that, you will decrease your dose to Eliquis 5 mg (1 tablet) twice daily. I have discussed with your cardiologist Dr. Gwenlyn Found, and since we are starting you on Eliquis, we will DISCONTINUE your Brilinta. You will need to take aspirin 81 mg once daily in addition to the Eliquis. While you are on a blood thinner, pay close attention to any signs of abnormal bleeding.  **Please see the attached handout for information on bleeding precautions. With any blood clot in your leg/DVT, there is some inherent risk that it may break off from your leg vein and travel to your lungs.  This is known as a "pulmonary embolism," and can be life-threatening in severe cases.  It is very important that you know the symptoms of a blood clot in your lungs (pulmonary embolism/PE).  Common symptoms of a PE include sudden onset difficulty breathing, unexplained cough, coughing up blood, chest pain, pain with breathing, racing heartbeat, or sudden dizziness/lightheadedness.  If the symptoms occur, seek IMMEDIATE medical attention at the emergency department. **Please see the attached handout for further information on DVT/blood clot.   - - - - - - - - - - - - - - - - - -    Thank you for choosing [place] at [facility] to provide your oncology and hematology care.  To afford each patient quality time with our provider, please arrive at least 15 minutes before  your scheduled appointment time.   If you have a lab appointment with the [place] please come in thru the Main Entrance and check in at the main information desk.  You need to re-schedule your appointment should you arrive 10 or more minutes late.  We strive to give you quality time with our providers, and arriving late affects you and other patients whose appointments are after yours.  Also, if you no show three or more times for appointments you may be dismissed from the clinic at the providers discretion.     Again, thank you for choosing [facility].  Our hope is that these requests will decrease the amount of time that you wait before being seen by our physicians.       _____________________________________________________________  Should you have questions after your visit to [facility], please contact our office at [phone] and follow the prompts.  Our office hours are 8:00 a.m. and 4:30 p.m. Monday - Friday.  Please note that voicemails left after 4:00 p.m. may not be returned until the following business day.  We are closed weekends and major holidays.  You do have access to a nurse 24-7, just call the main number to the clinic [phone] and do not press any options, hold on the line and a nurse will answer the phone.    For prescription refill requests, have your pharmacy contact our office and allow 72 hours.  Due to Covid, you will need to wear a mask upon entering the hospital. If you do not have a mask, a mask will be given to you at the Main Entrance upon arrival. For doctor visits, patients may have 1 support person age 70 or older with them. For treatment visits, patients can not have anyone with them due to social distancing guidelines and our immunocompromised population.

## 2022-03-04 NOTE — Progress Notes (Signed)
Patient receiving 4 grams of magnesium today per providers order.. Vital signs WNL.  Patient has no new complaints at this time.    4grams magnesium given today per MD orders.  Stable during infusion without adverse affects.  Vital signs stable.  No complaints at this time.  Discharge from clinic ambulatory in stable condition.  Alert and oriented X 3.  Follow up with [facility] as scheduled.

## 2022-03-08 ENCOUNTER — Encounter (HOSPITAL_COMMUNITY): Payer: Self-pay

## 2022-03-08 NOTE — Progress Notes (Signed)
Prior authorization for Eliquis 5 mg submitted to [facility] via CoverMyMeds.  PA approved from 03/08/22-03/08/23.

## 2022-03-11 ENCOUNTER — Inpatient Hospital Stay (HOSPITAL_COMMUNITY): Payer: Managed Care, Other (non HMO)

## 2022-03-11 ENCOUNTER — Ambulatory Visit (HOSPITAL_COMMUNITY): Payer: Managed Care, Other (non HMO) | Admitting: Dietician

## 2022-03-11 ENCOUNTER — Inpatient Hospital Stay (HOSPITAL_BASED_OUTPATIENT_CLINIC_OR_DEPARTMENT_OTHER): Payer: Managed Care, Other (non HMO) | Admitting: Hematology

## 2022-03-11 ENCOUNTER — Encounter (HOSPITAL_COMMUNITY): Payer: Self-pay | Admitting: Hematology

## 2022-03-11 DIAGNOSIS — C50812 Malignant neoplasm of overlapping sites of left female breast: Secondary | ICD-10-CM

## 2022-03-11 DIAGNOSIS — Z170 Estrogen receptor positive status [ER+]: Secondary | ICD-10-CM

## 2022-03-11 DIAGNOSIS — Z95828 Presence of other vascular implants and grafts: Secondary | ICD-10-CM

## 2022-03-11 LAB — COMPREHENSIVE METABOLIC PANEL
ALT: 44 U/L (ref 0–44)
AST: 16 U/L (ref 15–41)
Albumin: 3.2 g/dL — ABNORMAL LOW (ref 3.5–5.0)
Alkaline Phosphatase: 223 U/L — ABNORMAL HIGH (ref 38–126)
Anion gap: 6 (ref 5–15)
BUN: 21 mg/dL (ref 8–23)
CO2: 26 mmol/L (ref 22–32)
Calcium: 9.4 mg/dL (ref 8.9–10.3)
Chloride: 108 mmol/L (ref 98–111)
Creatinine, Ser: 0.95 mg/dL (ref 0.44–1.00)
GFR, Estimated: >60 mL/min (ref >60)
Glucose, Bld: 117 mg/dL — ABNORMAL HIGH (ref 70–99)
Potassium: 4.1 mmol/L (ref 3.5–5.1)
Sodium: 140 mmol/L (ref 135–145)
Total Bilirubin: 0.4 mg/dL (ref 0.3–1.2)
Total Protein: 6.7 g/dL (ref 6.5–8.1)

## 2022-03-11 LAB — CBC WITH DIFFERENTIAL/PLATELET
Abs Immature Granulocytes: 0.04 10*3/uL (ref 0.00–0.07)
Basophils Absolute: 0 10*3/uL (ref 0.0–0.1)
Basophils Relative: 1 %
Eosinophils Absolute: 0 10*3/uL (ref 0.0–0.5)
Eosinophils Relative: 0 %
HCT: 27.5 % — ABNORMAL LOW (ref 36.0–46.0)
Hemoglobin: 8.5 g/dL — ABNORMAL LOW (ref 12.0–15.0)
Immature Granulocytes: 1 %
Lymphocytes Relative: 19 %
Lymphs Abs: 1.2 10*3/uL (ref 0.7–4.0)
MCH: 33.6 pg (ref 26.0–34.0)
MCHC: 30.9 g/dL (ref 30.0–36.0)
MCV: 108.7 fL — ABNORMAL HIGH (ref 80.0–100.0)
Monocytes Absolute: 0.5 10*3/uL (ref 0.1–1.0)
Monocytes Relative: 7 %
Neutro Abs: 4.6 10*3/uL (ref 1.7–7.7)
Neutrophils Relative %: 72 %
Platelets: 205 10*3/uL (ref 150–400)
RBC: 2.53 MIL/uL — ABNORMAL LOW (ref 3.87–5.11)
RDW: 19.4 % — ABNORMAL HIGH (ref 11.5–15.5)
WBC: 6.3 10*3/uL (ref 4.0–10.5)
nRBC: 0 % (ref 0.0–0.2)

## 2022-03-11 LAB — URINALYSIS, ROUTINE W REFLEX MICROSCOPIC
Bilirubin Urine: NEGATIVE
Glucose, UA: NEGATIVE mg/dL
Hgb urine dipstick: NEGATIVE
Ketones, ur: NEGATIVE mg/dL
Nitrite: POSITIVE — AB
Protein, ur: 30 mg/dL — AB
Specific Gravity, Urine: 1.014 (ref 1.005–1.030)
WBC, UA: >50 WBC/hpf — ABNORMAL HIGH (ref 0–5)
pH: 7 (ref 5.0–8.0)

## 2022-03-11 LAB — MAGNESIUM: Magnesium: 1.3 mg/dL — ABNORMAL LOW (ref 1.7–2.4)

## 2022-03-11 MED ORDER — SODIUM CHLORIDE 0.9 % IV SOLN
420.0000 mg | Freq: Once | INTRAVENOUS | Status: AC
  Administered 2022-03-11: 420 mg via INTRAVENOUS
  Filled 2022-03-11: qty 14

## 2022-03-11 MED ORDER — MAGNESIUM SULFATE 2 GM/50ML IV SOLN
2.0000 g | INTRAVENOUS | Status: AC
  Administered 2022-03-11: 2 g via INTRAVENOUS
  Filled 2022-03-11: qty 50

## 2022-03-11 MED ORDER — SODIUM CHLORIDE 0.9 % IV SOLN
150.0000 mg | Freq: Once | INTRAVENOUS | Status: AC
  Administered 2022-03-11: 150 mg via INTRAVENOUS
  Filled 2022-03-11: qty 150

## 2022-03-11 MED ORDER — DIPHENHYDRAMINE HCL 25 MG PO CAPS
50.0000 mg | ORAL_CAPSULE | Freq: Once | ORAL | Status: AC
  Administered 2022-03-11: 50 mg via ORAL
  Filled 2022-03-11: qty 2

## 2022-03-11 MED ORDER — CIPROFLOXACIN HCL 500 MG PO TABS: 500.0000 mg | ORAL_TABLET | Freq: Two times a day (BID) | ORAL | 0 refills | Status: DC

## 2022-03-11 MED ORDER — ACETAMINOPHEN 325 MG PO TABS
650.0000 mg | ORAL_TABLET | Freq: Once | ORAL | Status: AC
  Administered 2022-03-11: 650 mg via ORAL
  Filled 2022-03-11: qty 2

## 2022-03-11 MED ORDER — SODIUM CHLORIDE 0.9 % IV SOLN: Freq: Once | INTRAVENOUS | Status: AC

## 2022-03-11 MED ORDER — PALONOSETRON HCL INJECTION 0.25 MG/5ML
0.2500 mg | Freq: Once | INTRAVENOUS | Status: AC
  Administered 2022-03-11: 0.25 mg via INTRAVENOUS
  Filled 2022-03-11: qty 5

## 2022-03-11 MED ORDER — SODIUM CHLORIDE 0.9 % IV SOLN
10.0000 mg | Freq: Once | INTRAVENOUS | Status: AC
  Administered 2022-03-11: 10 mg via INTRAVENOUS
  Filled 2022-03-11: qty 10

## 2022-03-11 MED ORDER — MAGNESIUM SULFATE 2 GM/50ML IV SOLN
2.0000 g | Freq: Once | INTRAVENOUS | Status: AC
  Administered 2022-03-11: 2 g via INTRAVENOUS

## 2022-03-11 MED ORDER — MAGNESIUM SULFATE 4 GM/100ML IV SOLN: 4.0000 g | Freq: Once | INTRAVENOUS | Status: DC

## 2022-03-11 MED ORDER — SODIUM CHLORIDE 0.9 % IV SOLN
200.0000 mg | Freq: Once | INTRAVENOUS | Status: AC
  Administered 2022-03-11: 200 mg via INTRAVENOUS
  Filled 2022-03-11: qty 20

## 2022-03-11 MED ORDER — SODIUM CHLORIDE 0.9 % IV SOLN
50.0000 mg/m2 | Freq: Once | INTRAVENOUS | Status: AC
  Administered 2022-03-11: 90 mg via INTRAVENOUS
  Filled 2022-03-11: qty 9

## 2022-03-11 MED ORDER — TRASTUZUMAB-ANNS CHEMO 150 MG IV SOLR
6.0000 mg/kg | Freq: Once | INTRAVENOUS | Status: AC
  Administered 2022-03-11: 399 mg via INTRAVENOUS
  Filled 2022-03-11: qty 19

## 2022-03-11 MED ORDER — SODIUM CHLORIDE 0.9% FLUSH
10.0000 mL | INTRAVENOUS | Status: DC | PRN
  Administered 2022-03-11: 10 mL

## 2022-03-11 MED ORDER — HEPARIN SOD (PORK) LOCK FLUSH 100 UNIT/ML IV SOLN
500.0000 [IU] | Freq: Once | INTRAVENOUS | Status: AC | PRN
  Administered 2022-03-11: 500 [IU]

## 2022-03-11 NOTE — Progress Notes (Signed)
Message received from A. Anderson / Dr. Vickey Huger to proceed with treatment. Orders received to infuse 4 grams Magsesium Sulfate today with treatment. Patient has no complaints of any changes since her last treatment. MAR reviewed and updated.   Treatment given today per MD orders. Tolerated infusion without adverse affects. Vital signs stable. No complaints at this time. Discharged from clinic ambulatory in stable condition. Alert and oriented x 3. F/U with [facility] as scheduled.

## 2022-03-11 NOTE — Patient Instructions (Addendum)
[place] at [facility] Discharge Instructions   You were seen and examined today by Dr. Delton Coombes.  He reviewed the results of your lab work which are mostly normal/stable. Your magnesium is low at 1.3. We will give IV magnesium in the clinic today. Urinalysis is pending. If this shows infection we will send an antibiotic to treat.   We will proceed with your final treatment today. We will repeat a CT scan soon.  Return as scheduled.   Thank you for choosing [place] at [facility] to provide your oncology and hematology care.  To afford each patient quality time with our provider, please arrive at least 15 minutes before your scheduled appointment time.   If you have a lab appointment with the [place] please come in thru the Main Entrance and check in at the main information desk.  You need to re-schedule your appointment should you arrive 10 or more minutes late.  We strive to give you quality time with our providers, and arriving late affects you and other patients whose appointments are after yours.  Also, if you no show three or more times for appointments you may be dismissed from the clinic at the providers discretion.     Again, thank you for choosing [facility].  Our hope is that these requests will decrease the amount of time that you wait before being seen by our physicians.       _____________________________________________________________  Should you have questions after your visit to [facility], please contact our office at [phone] and follow the prompts.  Our office hours are 8:00 a.m. and 4:30 p.m. Monday - Friday.  Please note that voicemails left after 4:00 p.m. may not be returned until the following business day.  We are closed weekends and major holidays.  You do have access to a nurse 24-7, just call the main number to the clinic [phone] and do not press any options, hold on  the line and a nurse will answer the phone.    For prescription refill requests, have your pharmacy contact our office and allow 72 hours.    Due to Covid, you will need to wear a mask upon entering the hospital. If you do not have a mask, a mask will be given to you at the Main Entrance upon arrival. For doctor visits, patients may have 1 support person age 29 or older with them. For treatment visits, patients can not have anyone with them due to social distancing guidelines and our immunocompromised population.

## 2022-03-11 NOTE — Progress Notes (Signed)
Confirmed dose of carboplatin today to be 200 mg  T.O. Dr Rhys Martini, PharmD

## 2022-03-11 NOTE — Patient Instructions (Signed)
[facility]  Discharge Instructions: Thank you for choosing [facility] to provide your oncology and hematology care.  If you have a lab appointment with the [facility], please come in thru the Main Entrance and check in at the main information desk.  Wear comfortable clothing and clothing appropriate for easy access to any Portacath or PICC line.   We strive to give you quality time with your provider. You may need to reschedule your appointment if you arrive late (15 or more minutes).  Arriving late affects you and other patients whose appointments are after yours.  Also, if you miss three or more appointments without notifying the office, you may be dismissed from the clinic at the provider's discretion.      For prescription refill requests, have your pharmacy contact our office and allow 72 hours for refills to be completed.    Today you received the following chemotherapy and/or immunotherapy agents Kanjinti, Perjeta, Taxotere, Carboplatin.  Carboplatin injection What is this medication? CARBOPLATIN (KAR boe pla tin) is a chemotherapy drug. It targets fast dividing cells, like cancer cells, and causes these cells to die. This medicine is used to treat ovarian cancer and many other cancers. This medicine may be used for other purposes; ask your health care provider or pharmacist if you have questions. COMMON BRAND NAME(S): Paraplatin What should I tell my care team before I take this medication? They need to know if you have any of these conditions: blood disorders hearing problems kidney disease recent or ongoing radiation therapy an unusual or allergic reaction to carboplatin, cisplatin, other chemotherapy, other medicines, foods, dyes, or preservatives pregnant or trying to get pregnant breast-feeding How should I use this medication? This drug is usually given as an infusion into a vein. It is administered in a hospital or clinic by a specially trained  health care professional. Talk to your pediatrician regarding the use of this medicine in children. Special care may be needed. Overdosage: If you think you have taken too much of this medicine contact a poison control center or emergency room at once. NOTE: This medicine is only for you. Do not share this medicine with others. What if I miss a dose? It is important not to miss a dose. Call your doctor or health care professional if you are unable to keep an appointment. What may interact with this medication? medicines for seizures medicines to increase blood counts like filgrastim, pegfilgrastim, sargramostim some antibiotics like amikacin, gentamicin, neomycin, streptomycin, tobramycin vaccines Talk to your doctor or health care professional before taking any of these medicines: acetaminophen aspirin ibuprofen ketoprofen naproxen This list may not describe all possible interactions. Give your health care provider a list of all the medicines, herbs, non-prescription drugs, or dietary supplements you use. Also tell them if you smoke, drink alcohol, or use illegal drugs. Some items may interact with your medicine. What should I watch for while using this medication? Your condition will be monitored carefully while you are receiving this medicine. You will need important blood work done while you are taking this medicine. This drug may make you feel generally unwell. This is not uncommon, as chemotherapy can affect healthy cells as well as cancer cells. Report any side effects. Continue your course of treatment even though you feel ill unless your doctor tells you to stop. In some cases, you may be given additional medicines to help with side effects. Follow all directions for their use. Call your doctor or health  care professional for advice if you get a fever, chills or sore throat, or other symptoms of a cold or flu. Do not treat yourself. This drug decreases your body's ability to fight  infections. Try to avoid being around people who are sick. This medicine may increase your risk to bruise or bleed. Call your doctor or health care professional if you notice any unusual bleeding. Be careful brushing and flossing your teeth or using a toothpick because you may get an infection or bleed more easily. If you have any dental work done, tell your dentist you are receiving this medicine. Avoid taking products that contain aspirin, acetaminophen, ibuprofen, naproxen, or ketoprofen unless instructed by your doctor. These medicines may hide a fever. Do not become pregnant while taking this medicine. Women should inform their doctor if they wish to become pregnant or think they might be pregnant. There is a potential for serious side effects to an unborn child. Talk to your health care professional or pharmacist for more information. Do not breast-feed an infant while taking this medicine. What side effects may I notice from receiving this medication? Side effects that you should report to your doctor or health care professional as soon as possible: allergic reactions like skin rash, itching or hives, swelling of the face, lips, or tongue signs of infection - fever or chills, cough, sore throat, pain or difficulty passing urine signs of decreased platelets or bleeding - bruising, pinpoint red spots on the skin, black, tarry stools, nosebleeds signs of decreased red blood cells - unusually weak or tired, fainting spells, lightheadedness breathing problems changes in hearing changes in vision chest pain high blood pressure low blood counts - This drug may decrease the number of white blood cells, red blood cells and platelets. You may be at increased risk for infections and bleeding. nausea and vomiting pain, swelling, redness or irritation at the injection site pain, tingling, numbness in the hands or feet problems with balance, talking, walking trouble passing urine or change in the  amount of urine Side effects that usually do not require medical attention (report to your doctor or health care professional if they continue or are bothersome): hair loss loss of appetite metallic taste in the mouth or changes in taste This list may not describe all possible side effects. Call your doctor for medical advice about side effects. You may report side effects to FDA at 1-800-FDA-1088. Where should I keep my medication? This drug is given in a hospital or clinic and will not be stored at home. NOTE: This sheet is a summary. It may not cover all possible information. If you have questions about this medicine, talk to your doctor, pharmacist, or health care provider.  2023 Elsevier/Gold Standard (2008-01-17 00:00:00)  Docetaxel injection What is this medication? DOCETAXEL (doe se TAX el) is a chemotherapy drug. It targets fast dividing cells, like cancer cells, and causes these cells to die. This medicine is used to treat many types of cancers like breast cancer, certain stomach cancers, head and neck cancer, lung cancer, and prostate cancer. This medicine may be used for other purposes; ask your health care provider or pharmacist if you have questions. COMMON BRAND NAME(S): Docefrez, Taxotere What should I tell my care team before I take this medication? They need to know if you have any of these conditions: infection (especially a virus infection such as chickenpox, cold sores, or herpes) liver disease low blood counts, like low white cell, platelet, or red cell counts an unusual  or allergic reaction to docetaxel, polysorbate 80, other chemotherapy agents, other medicines, foods, dyes, or preservatives pregnant or trying to get pregnant breast-feeding How should I use this medication? This drug is given as an infusion into a vein. It is administered in a hospital or clinic by a specially trained health care professional. Talk to your pediatrician regarding the use of this  medicine in children. Special care may be needed. Overdosage: If you think you have taken too much of this medicine contact a poison control center or emergency room at once. NOTE: This medicine is only for you. Do not share this medicine with others. What if I miss a dose? It is important not to miss your dose. Call your doctor or health care professional if you are unable to keep an appointment. What may interact with this medication? Do not take this medicine with any of the following medications: live virus vaccines This medicine may also interact with the following medications: aprepitant certain antibiotics like erythromycin or clarithromycin certain antivirals for HIV or hepatitis certain medicines for fungal infections like fluconazole, itraconazole, ketoconazole, posaconazole, or voriconazole cimetidine ciprofloxacin conivaptan cyclosporine dronedarone fluvoxamine grapefruit juice imatinib verapamil This list may not describe all possible interactions. Give your health care provider a list of all the medicines, herbs, non-prescription drugs, or dietary supplements you use. Also tell them if you smoke, drink alcohol, or use illegal drugs. Some items may interact with your medicine. What should I watch for while using this medication? Your condition will be monitored carefully while you are receiving this medicine. You will need important blood work done while you are taking this medicine. Call your doctor or health care professional for advice if you get a fever, chills or sore throat, or other symptoms of a cold or flu. Do not treat yourself. This drug decreases your body's ability to fight infections. Try to avoid being around people who are sick. Some products may contain alcohol. Ask your health care professional if this medicine contains alcohol. Be sure to tell all health care professionals you are taking this medicine. Certain medicines, like metronidazole and disulfiram, can  cause an unpleasant reaction when taken with alcohol. The reaction includes flushing, headache, nausea, vomiting, sweating, and increased thirst. The reaction can last from 30 minutes to several hours. You may get drowsy or dizzy. Do not drive, use machinery, or do anything that needs mental alertness until you know how this medicine affects you. Do not stand or sit up quickly, especially if you are an older patient. This reduces the risk of dizzy or fainting spells. Alcohol may interfere with the effect of this medicine. Talk to your health care professional about your risk of cancer. You may be more at risk for certain types of cancer if you take this medicine. Do not become pregnant while taking this medicine or for 6 months after stopping it. Women should inform their doctor if they wish to become pregnant or think they might be pregnant. There is a potential for serious side effects to an unborn child. Talk to your health care professional or pharmacist for more information. Do not breast-feed an infant while taking this medicine or for 1 week after stopping it. Males who get this medicine must use a condom during sex with females who can get pregnant. If you get a woman pregnant, the baby could have birth defects. The baby could die before they are born. You will need to continue wearing a condom for 3 months after stopping  the medicine. Tell your health care provider right away if your partner becomes pregnant while you are taking this medicine. This may interfere with the ability to father a child. You should talk to your doctor or health care professional if you are concerned about your fertility. What side effects may I notice from receiving this medication? Side effects that you should report to your doctor or health care professional as soon as possible: allergic reactions like skin rash, itching or hives, swelling of the face, lips, or tongue blurred vision breathing problems changes in  vision low blood counts - This drug may decrease the number of white blood cells, red blood cells and platelets. You may be at increased risk for infections and bleeding. nausea and vomiting pain, redness or irritation at site where injected pain, tingling, numbness in the hands or feet redness, blistering, peeling, or loosening of the skin, including inside the mouth signs of decreased platelets or bleeding - bruising, pinpoint red spots on the skin, black, tarry stools, nosebleeds signs of decreased red blood cells - unusually weak or tired, fainting spells, lightheadedness signs of infection - fever or chills, cough, sore throat, pain or difficulty passing urine swelling of the ankle, feet, hands Side effects that usually do not require medical attention (report to your doctor or health care professional if they continue or are bothersome): constipation diarrhea fingernail or toenail changes hair loss loss of appetite mouth sores muscle pain This list may not describe all possible side effects. Call your doctor for medical advice about side effects. You may report side effects to FDA at 1-800-FDA-1088. Where should I keep my medication? This drug is given in a hospital or clinic and will not be stored at home. NOTE: This sheet is a summary. It may not cover all possible information. If you have questions about this medicine, talk to your doctor, pharmacist, or health care provider.  2023 Elsevier/Gold Standard (2021-07-10 00:00:00)  Pertuzumab injection What is this medication? PERTUZUMAB (per TOOZ ue mab) is a monoclonal antibody. It is used to treat breast cancer. This medicine may be used for other purposes; ask your health care provider or pharmacist if you have questions. COMMON BRAND NAME(S): PERJETA What should I tell my care team before I take this medication? They need to know if you have any of these conditions: heart disease heart failure high blood pressure history  of irregular heart beat recent or ongoing radiation therapy an unusual or allergic reaction to pertuzumab, other medicines, foods, dyes, or preservatives pregnant or trying to get pregnant breast-feeding How should I use this medication? This medicine is for infusion into a vein. It is given by a health care professional in a hospital or clinic setting. Talk to your pediatrician regarding the use of this medicine in children. Special care may be needed. Overdosage: If you think you have taken too much of this medicine contact a poison control center or emergency room at once. NOTE: This medicine is only for you. Do not share this medicine with others. What if I miss a dose? It is important not to miss your dose. Call your doctor or health care professional if you are unable to keep an appointment. What may interact with this medication? Interactions are not expected. Give your health care provider a list of all the medicines, herbs, non-prescription drugs, or dietary supplements you use. Also tell them if you smoke, drink alcohol, or use illegal drugs. Some items may interact with your medicine. This list may  not describe all possible interactions. Give your health care provider a list of all the medicines, herbs, non-prescription drugs, or dietary supplements you use. Also tell them if you smoke, drink alcohol, or use illegal drugs. Some items may interact with your medicine. What should I watch for while using this medication? Your condition will be monitored carefully while you are receiving this medicine. Report any side effects. Continue your course of treatment even though you feel ill unless your doctor tells you to stop. Do not become pregnant while taking this medicine or for 7 months after stopping it. Women should inform their doctor if they wish to become pregnant or think they might be pregnant. Women of child-bearing potential will need to have a negative pregnancy test before  starting this medicine. There is a potential for serious side effects to an unborn child. Talk to your health care professional or pharmacist for more information. Do not breast-feed an infant while taking this medicine or for 7 months after stopping it. Women must use effective birth control with this medicine. Call your doctor or health care professional for advice if you get a fever, chills or sore throat, or other symptoms of a cold or flu. Do not treat yourself. Try to avoid being around people who are sick. You may experience fever, chills, and headache during the infusion. Report any side effects during the infusion to your health care professional. What side effects may I notice from receiving this medication? Side effects that you should report to your doctor or health care professional as soon as possible: breathing problems chest pain or palpitations dizziness feeling faint or lightheaded fever or chills skin rash, itching or hives sore throat swelling of the face, lips, or tongue swelling of the legs or ankles unusually weak or tired Side effects that usually do not require medical attention (report to your doctor or health care professional if they continue or are bothersome): diarrhea hair loss nausea, vomiting tiredness This list may not describe all possible side effects. Call your doctor for medical advice about side effects. You may report side effects to FDA at 1-800-FDA-1088. Where should I keep my medication? This drug is given in a hospital or clinic and will not be stored at home. NOTE: This sheet is a summary. It may not cover all possible information. If you have questions about this medicine, talk to your doctor, pharmacist, or health care provider.  2023 Elsevier/Gold Standard (2015-09-11 00:00:00)  Trastuzumab injection for infusion What is this medication? TRASTUZUMAB (tras TOO zoo mab) is a monoclonal antibody. It is used to treat breast cancer and stomach  cancer. This medicine may be used for other purposes; ask your health care provider or pharmacist if you have questions. COMMON BRAND NAME(S): Herceptin, Janae Bridgeman, Ontruzant, Trazimera What should I tell my care team before I take this medication? They need to know if you have any of these conditions: heart disease heart failure lung or breathing disease, like asthma an unusual or allergic reaction to trastuzumab, benzyl alcohol, or other medications, foods, dyes, or preservatives pregnant or trying to get pregnant breast-feeding How should I use this medication? This drug is given as an infusion into a vein. It is administered in a hospital or clinic by a specially trained health care professional. Talk to your pediatrician regarding the use of this medicine in children. This medicine is not approved for use in children. Overdosage: If you think you have taken too much of this medicine contact a  poison control center or emergency room at once. NOTE: This medicine is only for you. Do not share this medicine with others. What if I miss a dose? It is important not to miss a dose. Call your doctor or health care professional if you are unable to keep an appointment. What may interact with this medication? This medicine may interact with the following medications: certain types of chemotherapy, such as daunorubicin, doxorubicin, epirubicin, and idarubicin This list may not describe all possible interactions. Give your health care provider a list of all the medicines, herbs, non-prescription drugs, or dietary supplements you use. Also tell them if you smoke, drink alcohol, or use illegal drugs. Some items may interact with your medicine. What should I watch for while using this medication? Visit your doctor for checks on your progress. Report any side effects. Continue your course of treatment even though you feel ill unless your doctor tells you to stop. Call your doctor or  health care professional for advice if you get a fever, chills or sore throat, or other symptoms of a cold or flu. Do not treat yourself. Try to avoid being around people who are sick. You may experience fever, chills and shaking during your first infusion. These effects are usually mild and can be treated with other medicines. Report any side effects during the infusion to your health care professional. Fever and chills usually do not happen with later infusions. Do not become pregnant while taking this medicine or for 7 months after stopping it. Women should inform their doctor if they wish to become pregnant or think they might be pregnant. Women of child-bearing potential will need to have a negative pregnancy test before starting this medicine. There is a potential for serious side effects to an unborn child. Talk to your health care professional or pharmacist for more information. Do not breast-feed an infant while taking this medicine or for 7 months after stopping it. Women must use effective birth control with this medicine. What side effects may I notice from receiving this medication? Side effects that you should report to your doctor or health care professional as soon as possible: allergic reactions like skin rash, itching or hives, swelling of the face, lips, or tongue chest pain or palpitations cough dizziness feeling faint or lightheaded, falls fever general ill feeling or flu-like symptoms signs of worsening heart failure like breathing problems; swelling in your legs and feet unusually weak or tired Side effects that usually do not require medical attention (report to your doctor or health care professional if they continue or are bothersome): bone pain changes in taste diarrhea joint pain nausea/vomiting weight loss This list may not describe all possible side effects. Call your doctor for medical advice about side effects. You may report side effects to FDA at  1-800-FDA-1088. Where should I keep my medication? This drug is given in a hospital or clinic and will not be stored at home. NOTE: This sheet is a summary. It may not cover all possible information. If you have questions about this medicine, talk to your doctor, pharmacist, or health care provider.  2023 Elsevier/Gold Standard (2016-08-24 00:00:00)       To help prevent nausea and vomiting after your treatment, we encourage you to take your nausea medication as directed.  BELOW ARE SYMPTOMS THAT SHOULD BE REPORTED IMMEDIATELY: *FEVER GREATER THAN 100.4 F (38 C) OR HIGHER *CHILLS OR SWEATING *NAUSEA AND VOMITING THAT IS NOT CONTROLLED WITH YOUR NAUSEA MEDICATION *UNUSUAL SHORTNESS OF BREATH *UNUSUAL  BRUISING OR BLEEDING *URINARY PROBLEMS (pain or burning when urinating, or frequent urination) *BOWEL PROBLEMS (unusual diarrhea, constipation, pain near the anus) TENDERNESS IN MOUTH AND THROAT WITH OR WITHOUT PRESENCE OF ULCERS (sore throat, sores in mouth, or a toothache) UNUSUAL RASH, SWELLING OR PAIN  UNUSUAL VAGINAL DISCHARGE OR ITCHING   Items with * indicate a potential emergency and should be followed up as soon as possible or go to the Emergency Department if any problems should occur.  Please show the CHEMOTHERAPY ALERT CARD or IMMUNOTHERAPY ALERT CARD at check-in to the Emergency Department and triage nurse.  Should you have questions after your visit or need to cancel or reschedule your appointment, please contact [facility] [phone]  and follow the prompts.  Office hours are 8:00 a.m. to 4:30 p.m. Monday - Friday. Please note that voicemails left after 4:00 p.m. may not be returned until the following business day.  We are closed weekends and major holidays. You have access to a nurse at all times for urgent questions. Please call the main number to the clinic [phone] and follow the prompts.  For any non-urgent questions, you may also contact your provider  using MyChart. We now offer e-Visits for anyone 26 and older to request care online for non-urgent symptoms. For details visit [email or web address].   Also download the MyChart app! Go to the app store, search "MyChart", open the app, select [facility], and log in with your MyChart username and password.  Masks are optional in the cancer centers. If you would like for your care team to wear a mask while they are taking care of you, please let them know. For doctor visits, patients may have with them one support person who is at least 65 years old. At this time, visitors are not allowed in the infusion area.

## 2022-03-11 NOTE — Progress Notes (Signed)
Nutrition Follow-up:  Patient with breast cancer. She is receiving TCHP q21d (final treatment 7/20).  Met with patient in infusion. She reports appetite has improved and eating better. Patient has been using baking soda salt water rinses several times daily. Says food is tasting better. Patient is drinking 2 Ensure mixed with milk. Patient likes this. Yesterday she recalls bowl of cereal, sweet/sour chicken, pepper steak, squash and onions. Patient tried banatrol for loose stools. She did not like this. Diarrhea has resolved.    Medications: reviewed   Labs: Glucose 117, Mg 1.3  Anthropometrics: Weight 149 lb 4 oz today increased   7/6 - 143 lb 11.8 oz  6/21 - 142 lb 12.8 oz 6/13 - 148 lb 9.6 oz  5/31 - 158 lb 12.8 oz   NUTRITION DIAGNOSIS: Unintentional weight loss stable    INTERVENTION:  Congratulated pt on final treatment Continue baking soda salt water rinses several times daily Continue drinking 2 Ensure Plus/equivalent for added calories/protein Pt has contact information    MONITORING, EVALUATION, GOAL: weight trends, intake    NEXT VISIT: No follow-up scheduled. Pt encouraged to contact with nutrition questions/concerns

## 2022-03-11 NOTE — Progress Notes (Signed)
[place], [place] [id number]   CLINIC:  Medical Oncology/Hematology  PCP:  Traci Squibb, MD [address] [place] [address] [phone]   REASON FOR VISIT:  Follow-up for locally advanced left breast cancer  PRIOR THERAPY: none  NGS Results: not done  CURRENT THERAPY: Docetaxel + Carboplatin + Trastuzumab + Pertuzumab  (TCHP) q21d   BRIEF ONCOLOGIC HISTORY:  Oncology History  Breast cancer, left ([facility])  10/15/2021 Initial Diagnosis   Breast cancer, left ([facility])   11/04/2021 -  Chemotherapy   Patient is on Treatment Plan : BREAST  Docetaxel + Carboplatin + Trastuzumab + Pertuzumab  (TCHP) q21d       Genetic Testing   Negative genetic testing. No pathogenic variants identified on the [facility] Multi-Cancer+RNA panel. The report date is 11/02/2021.   The Multi-Cancer Panel + RNA offered by [facility] includes sequencing and/or deletion duplication testing of the following 84 genes: AIP, ALK, APC, ATM, AXIN2,BAP1,  BARD1, BLM, BMPR1A, BRCA1, BRCA2, BRIP1, CASR, CDC73, CDH1, CDK4, CDKN1B, CDKN1C, CDKN2A (p14ARF), CDKN2A (p16INK4a), CEBPA, CHEK2, CTNNA1, DICER1, DIS3L2, EGFR (c.2369C>T, p.Thr790Met variant only), EPCAM (Deletion/duplication testing only), FH, FLCN, GATA2, GPC3, GREM1 (Promoter region deletion/duplication testing only), HOXB13 (c.251G>A, p.Gly84Glu), HRAS, KIT, MAX, MEN1, MET, MITF (c.952G>A, p.Glu318Lys variant only), MLH1, MSH2, MSH3, MSH6, MUTYH, NBN, NF1, NF2, NTHL1, PALB2, PDGFRA, PHOX2B, PMS2, POLD1, POLE, POT1, PRKAR1A, PTCH1, PTEN, RAD50, RAD51C, RAD51D, RB1, RECQL4, RET, RUNX1, SDHAF2, SDHA (sequence changes only), SDHB, SDHC, SDHD, SMAD4, SMARCA4, SMARCB1, SMARCE1, STK11, SUFU, TERC, TERT, TMEM127, TP53, TSC1, TSC2, VHL, WRN and WT1.     CANCER STAGING:  Cancer Staging  Breast cancer, left ([place]) Staging form: Breast, AJCC 8th Edition - Clinical stage from 10/15/2021: Stage IIA (cT3, cN1, cM0, G2, ER+, PR+, HER2+) -  Unsigned   INTERVAL HISTORY:  Traci Fuller, a 66 y.o. female, returns for routine follow-up and consideration for next cycle of chemotherapy. [name] was last seen on 02/18/2022.  Due for cycle #6 of TCHP today.   Overall, she tells me she has been feeling pretty well. She denies n/v/d. She started Eliquis on 7/18. She reports 1 episode of tingling/numbness following her last treatment. She is not taking Magnesium as she reports it makes her nauseous. She continues drink 2 Ensures daily. She has gained 2 lbs since 7/7.   Overall, she feels ready for her last cycle of chemo today.   REVIEW OF SYSTEMS:  Review of Systems  Constitutional:  Negative for appetite change, fatigue and unexpected weight change.  Gastrointestinal:  Negative for diarrhea, nausea and vomiting.  Genitourinary:  Positive for dysuria.   Neurological:  Positive for numbness (x1).  All other systems reviewed and are negative.   PAST MEDICAL/SURGICAL HISTORY:  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 DES to midLAD, 75% stenosis of RCA not treated   Cancer [facility])    Current smoker    Essential hypertension    Family history of bladder cancer    Family history of breast cancer    Family history of leukemia    Family history of uterine cancer    Hyperlipidemia LDL goal <70    Myocardial infarction ([facility])    PONV (postoperative nausea and vomiting)    Port-A-Cath in place 10/29/2021   Past Surgical History:  Procedure Laterality Date   ABDOMINAL HYSTERECTOMY     BREAST BIOPSY Bilateral 10/16/2021  CORONARY STENT INTERVENTION N/A 01/10/2018   Procedure: CORONARY STENT INTERVENTION;  Surgeon: Traci Booze, MD;  Location: [facility];  Service: Cardiovascular;  Laterality: N/A;   LEFT HEART CATH AND CORONARY ANGIOGRAPHY N/A 01/10/2018   Procedure: LEFT HEART CATH AND CORONARY ANGIOGRAPHY;  Surgeon: Traci Booze, MD;  Location:  [facility];  Service: Cardiovascular;  Laterality: N/A;   PORTACATH PLACEMENT Right 10/26/2021   Procedure: INSERTION PORT-A-CATH;  Surgeon: Virl Cagey, MD;  Location: [facility];  Service: General;  Laterality: Right;    SOCIAL HISTORY:  Social History   Socioeconomic History   Marital status: Married    Spouse name: Not on file   Number of children: Not on file   Years of education: Not on file   Highest education level: Not on file  Occupational History   Not on file  Tobacco Use   Smoking status: Every Day    Packs/day: 1.00    Years: 43.00    Total pack years: 43.00    Types: Cigarettes    Start date: 1976   Smokeless tobacco: Never  Vaping Use   Vaping Use: Never used  Substance and Sexual Activity   Alcohol use: Not Currently   Drug use: Never   Sexual activity: Yes  Other Topics Concern   Not on file  Social History Narrative   Not on file   Social Determinants of Health   Financial Resource Strain: Not on file  Food Insecurity: Not on file  Transportation Needs: Not on file  Physical Activity: Not on file  Stress: Not on file  Social Connections: Not on file  Intimate Partner Violence: Not on file    FAMILY HISTORY:  Family History  Problem Relation Age of Onset   Bladder Cancer Mother 46   Cervical cancer Mother        dx 62s, hysterectomy   Hypertension Father    Cirrhosis Father    Leukemia Brother 37   Liver cancer Brother 27   Breast cancer Maternal Aunt        dx 71s   Cancer Maternal Uncle        unk type d. 48   Cancer Paternal Aunt        unk type   Breast cancer Maternal Grandmother        dx 55s   Uterine cancer Maternal Grandmother     CURRENT MEDICATIONS:  Current Outpatient Medications  Medication Sig Dispense Refill   ALPRAZolam (XANAX) 0.25 MG tablet Take 1 tablet (0.25 mg total) by mouth 2 (two) times daily as needed for anxiety. 60 tablet 2   amLODipine (NORVASC) 10 MG tablet Take 10 mg by mouth daily.      Apixaban Starter Pack, 19m and 542m (ELIQUIS DVT/PE STARTER PACK) Take as directed on package: start with two-10m43mablets twice daily for 7 days. On day 8, switch to one-10mg610mblet twice daily. 1 each 0   aspirin EC 81 MG tablet Take 1 tablet (81 mg total) by mouth daily. 90 tablet 3   CARBOPLATIN IV Inject into the vein every 21 ( twenty-one) days.     dexamethasone (DECADRON) 4 MG tablet Take 2 tablets (8 mg total) by mouth daily. Take in the mornings x7 days, starting on the first day after your next cycle of chemotherapy. 14 tablet 0   diphenoxylate-atropine (LOMOTIL) 2.5-0.025 MG tablet Take 2 tablets by mouth 4 (four) times daily as needed for diarrhea or loose  stools. 60 tablet 3   DOCEtaxel (TAXOTERE IV) Inject into the vein every 21 ( twenty-one) days.     dronabinol (MARINOL) 5 MG capsule Take 1 capsule (5 mg total) by mouth 2 (two) times daily before a meal. 60 capsule 3   Evolocumab (REPATHA SURECLICK) 671 MG/ML SOAJ Inject 140 mg into the skin every 14 (fourteen) days. 2 mL 11   gabapentin (NEURONTIN) 300 MG capsule Take 300 mg by mouth 2 (two) times daily.     levocetirizine (XYZAL) 5 MG tablet Take 5 mg by mouth daily.     lidocaine-prilocaine (EMLA) cream Apply a small amount to port a cath site (do not rub in) and cover with plastic wrap 1 hour prior to chemotherapy appointments 30 g 3   magnesium oxide (MAG-OX) 400 (240 Mg) MG tablet Take 1 tablet (400 mg total) by mouth in the morning, at noon, and at bedtime. 90 tablet 3   metoprolol tartrate (LOPRESSOR) 25 MG tablet Take 1 tablet (25 mg total) by mouth 2 (two) times daily. Please call and schedule overdue appointment with Dr. Gwenlyn Found for further refills . SECOND ATTEMPT! (Patient taking differently: Take 25 mg by mouth daily as needed (if blood pressure is 120/80 or higher). Please call and schedule overdue appointment with Dr. Gwenlyn Found for further refills . SECOND ATTEMPT!) 30 tablet 0   OLANZapine (ZYPREXA) 5 MG tablet Take 1 tablet  (5 mg total) by mouth at bedtime for 8 days. Take at bedtime starting the evening after you have received your next cycle of chemotherapy (Wednesday 02/10/2022). 8 tablet 0   omeprazole (PRILOSEC) 20 MG capsule Take 20 mg by mouth daily.     ondansetron (ZOFRAN-ODT) 8 MG disintegrating tablet Take 1 tablet (8 mg total) by mouth every 8 (eight) hours. 30 tablet 0   pegfilgrastim-bmez (ZIEXTENZO) 6 MG/0.6ML injection Inject 6 mg into the skin every 21 ( twenty-one) days. AFTER chemotherapy.     Pertuzumab (PERJETA IV) Inject into the vein every 21 ( twenty-one) days.     prochlorperazine (COMPAZINE) 10 MG tablet Take 1 tablet (10 mg total) by mouth every 6 (six) hours as needed (Nausea or vomiting). 30 tablet 3   scopolamine (TRANSDERM-SCOP) 1 MG/3DAYS Place 1 patch (1.5 mg total) onto the skin every 3 (three) days. 10 patch 12   trastuzumab-anns (KANJINTI) 150 MG SOLR injection Inject 462 mg into the vein every 21 ( twenty-one) days.     No current facility-administered medications for this visit.    ALLERGIES:  No Known Allergies  PHYSICAL EXAM:  Performance status (ECOG): 1 - Symptomatic but completely ambulatory  There were no vitals filed for this visit. Wt Readings from Last 3 Encounters:  02/26/22 147 lb 9.6 oz (67 kg)  02/24/22 143 lb 11.8 oz (65.2 kg)  02/10/22 142 lb 12.8 oz (64.8 kg)   Physical Exam Vitals reviewed.  Constitutional:      Appearance: Normal appearance.  Cardiovascular:     Rate and Rhythm: Normal rate and regular rhythm.     Pulses: Normal pulses.     Heart sounds: Normal heart sounds.  Pulmonary:     Effort: Pulmonary effort is normal.     Breath sounds: Normal breath sounds.  Neurological:     General: No focal deficit present.     Mental Status: She is alert and oriented to person, place, and time.  Psychiatric:        Mood and Affect: Mood normal.  Behavior: Behavior normal.     LABORATORY DATA:  I have reviewed the labs as listed.      Latest Ref Rng & Units 03/04/2022    8:51 AM 02/26/2022   10:15 AM 02/24/2022   10:20 AM  CBC  WBC 4.0 - 10.5 K/uL 11.3  22.2  16.0   Hemoglobin 12.0 - 15.0 g/dL 8.9  9.0  9.4   Hematocrit 36.0 - 46.0 % 28.2  28.9  30.5   Platelets 150 - 400 K/uL 153  179  183       Latest Ref Rng & Units 03/04/2022    8:51 AM 02/26/2022   10:15 AM 02/24/2022   10:20 AM  CMP  Glucose 70 - 99 mg/dL 116  116  111   BUN 8 - 23 mg/dL 20  35  41   Creatinine 0.44 - 1.00 mg/dL 1.04  1.14  1.20   Sodium 135 - 145 mmol/L 135  134  133   Potassium 3.5 - 5.1 mmol/L 4.2  4.0  4.7   Chloride 98 - 111 mmol/L 109  108  103   CO2 22 - 32 mmol/L 22  22  21    Calcium 8.9 - 10.3 mg/dL 8.9  9.7  9.7   Total Protein 6.5 - 8.1 g/dL 6.3  6.9  7.0   Total Bilirubin 0.3 - 1.2 mg/dL 0.6  0.2  0.4   Alkaline Phos 38 - 126 U/L 152  203  206   AST 15 - 41 U/L 28  19  26    ALT 0 - 44 U/L 35  40  46     DIAGNOSTIC IMAGING:  I have independently reviewed the scans and discussed with the patient. US Venous Img Lower Unilateral Left  Result Date: 03/04/2022 CLINICAL DATA:  LEFT leg pain, redness and swelling x2 days. EXAM: LEFT LOWER EXTREMITY VENOUS DOPPLER ULTRASOUND TECHNIQUE: Gray-scale sonography with compression, as well as color and duplex ultrasound, were performed to evaluate the deep venous system(s) from the level of the common femoral vein through the popliteal and proximal calf veins. COMPARISON:  CT [facility], 01/05/2022. FINDINGS: VENOUS Heterogeneously hypoechoic filling defect with resulting lack of coaptation involving the LEFT GSV, from the level of the knee through the saphenofemoral junction. Thrombus extension from the GSV at the [facility] to involve the proximal common femoral vein. Additional thrombus within 1 of the paired peroneal veins. Normal compressibility of the LEFT superficial femoral, and popliteal veins, as well as the visualized calf veins. Visualized portions of the profunda femoral vein is unremarkable. Doppler  waveforms show normal direction of venous flow, normal respiratory plasticity and response to augmentation. Limited views of the contralateral common femoral vein are unremarkable. OTHER No abnormal fluid collection. Limitations: none IMPRESSION: 1. 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 2. Examination is POSITIVE for DVT within the LEFT lower extremity involving the CFV. Electronically Signed   By: Michaelle Birks M.D.   On: 03/04/2022 10:55     ASSESSMENT:  Locally advanced HER2 positive left breast cancer: - Patient felt lump over the last 1 year. - She noticed lump has gotten bigger and deformed since September 2022.  She has itching over the skin of the breast. - Left breast 4:00 biopsy: Invasive mammary carcinoma, grade 2, ER 100%, PR 70%, Ki-67 25%, HER2 FISH positive - Left breast 2:00 biopsy: Invasive ductal carcinoma, grade 2, ER 100%, PR 70%, Ki-67 25%, HER2 negative - Left axillary lymph node  biopsy: IDC, ER 100%, PR 50%, HER2 negative, E-cadherin positive - Bilateral breast MRI on 10/09/2021: Multiple enhancing masses with intervening non-mass enhancement involving most of the LOQ and part of the UOQ.  Abnormal enhancement spans approximately 12.4 x 6.4 x 7 cm.  Diffuse skin thickening/edema.  Multiple left axillary lymph nodes, largest node measuring approximately 2.4 cm in the lower axilla.  Indeterminate non-mass enhancement inferior to the biopsy site in the subareolar right breast measuring 1.5 x 0.9 x 0.8 cm. - Right breast biopsy UOQ on 10/01/2021: Fibroadenomatoid nodule with calcifications, fibrocystic changes including apocrine metaplasia with no malignancy. -We have reviewed PET scan from 10/22/2021: Large left breast mass and numerous hypermetabolic left axillary and subpectoral lymph nodes.  2 hypermetabolic left upper lobe lung nodules measuring 7 mm with SUV 4.2 and 3.2 respectively.  No other hypermetabolic lung nodules.  No distant  metastatic disease.  Nonspecific low-level subareolar activity in the right breast near the surgical clips and a single mildly hypermetabolic right axillary lymph node. - Echocardiogram shows EF 60 to 65%. - She has oligometastatic disease with 2 small lung nodules.  Biopsy is not feasible.  I have recommended TCHP regimen followed by restaging after 3 cycles. - Cycle 1 of TCHP on 11/04/2021.  Cycle 4 dose reduced with carboplatin 400 mg and docetaxel 60 Mg/M squared. - CT chest (02/03/2022): Left upper lobe lung nodule 0.3 cm previously 0.7 cm.  Left upper lobe lung nodule measures 0.5 cm, previously 0.7 cm.  Subpectoral lymph node has decreased to 0.6 from 1.8 x 1.2 cm.  Interval decrease in the breast mass.  No new areas seen.    Social/family history: - She lives at home with her husband and daughter.  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  Current active smoker, half pack per day since age 30. - Mother had cervical cancer and bladder cancer.  Maternal grandmother had ovarian cancer.  Brother died at age 85 from acute leukemia.  Another brother died of liver cancer.  Maternal half aunt had breast cancer.   PLAN:  Oligometastatic (two 7 mm lung lesions) HER2 positive left breast cancer: -She has tolerated her last cycle very well. - We have reviewed her labs.  LFTs are normal.  She may proceed with cycle 6 with dose reduction carboplatin flat dose to 100 mg and docetaxel 50 Mgs/m2. - We will arrange for fluids tomorrow.  We will review her labs next week and possible fluids. - We will arrange for CT CAP with contrast and return to clinic in 3 weeks for Herceptin and pertuzumab maintenance. - If there is no further evidence of metastatic disease, she will be referred to left mastectomy and lymph node biopsy.  Patient inquired about reconstruction.  She will talk to Dr. Constance Haw about it. - After her mastectomy, we will treat the remaining lung lesions with SBRT. - We will also  consider starting her on antiestrogen therapy with anastrozole.  We will consider bone density test.  2.  Right facial shingles/postherpetic right hemifacial pain: -She had 1 episode of tingling in the feet lasted 1 day.  Continue gabapentin 300 mg 3 times daily.  3.  Hypomagnesemia: -She cannot take magnesium due to nausea. - Magnesium is low at 1.3 today.  She will receive 4 g of IV magnesium.  4.  Anxiety: -She will continue Xanax 0.25 mg at bedtime daily.  5.  Hypertension: -Norvasc is on hold.  Blood pressure today is 136/83.  6.  Weight loss: -She has gained 2 pounds since last cycle.  Continue 2 cans of Ensure and milkshake daily.  7.  Left leg DVT: - Diagnosed on 03/04/2022 with left GSV thrombophlebitis with propagation into the proximal common femoral vein. - Continue Eliquis twice daily.   Orders placed this encounter:  No orders of the defined types were placed in this encounter.    Derek Jack, MD [place] [phone]   I, Thana Ates, am acting as a scribe for Dr. Derek Jack.  I, Derek Jack MD, have reviewed the above documentation for accuracy and completeness, and I agree with the above.

## 2022-03-12 ENCOUNTER — Inpatient Hospital Stay (HOSPITAL_COMMUNITY): Payer: Managed Care, Other (non HMO)

## 2022-03-12 VITALS — BP 166/84 | HR 78 | Temp 97.9°F | Resp 18

## 2022-03-12 DIAGNOSIS — Z170 Estrogen receptor positive status [ER+]: Secondary | ICD-10-CM

## 2022-03-12 DIAGNOSIS — C50812 Malignant neoplasm of overlapping sites of left female breast: Secondary | ICD-10-CM | POA: Diagnosis not present

## 2022-03-12 LAB — CANCER ANTIGEN 27.29: CA 27.29: 30.8 U/mL (ref 0.0–38.6)

## 2022-03-12 LAB — CANCER ANTIGEN 15-3: CA 15-3: 31.9 U/mL — ABNORMAL HIGH (ref 0.0–25.0)

## 2022-03-12 MED ORDER — POTASSIUM CHLORIDE IN NACL 20-0.9 MEQ/L-% IV SOLN
Freq: Once | INTRAVENOUS | Status: AC
  Filled 2022-03-12: qty 1000

## 2022-03-12 MED ORDER — SODIUM CHLORIDE 0.9% FLUSH
10.0000 mL | Freq: Once | INTRAVENOUS | Status: AC | PRN
  Administered 2022-03-12: 10 mL

## 2022-03-12 MED ORDER — MAGNESIUM SULFATE 2 GM/50ML IV SOLN
2.0000 g | Freq: Once | INTRAVENOUS | Status: AC
  Administered 2022-03-12: 2 g via INTRAVENOUS
  Filled 2022-03-12: qty 50

## 2022-03-12 MED ORDER — SODIUM CHLORIDE 0.9 % IV SOLN: Freq: Once | INTRAVENOUS | Status: AC

## 2022-03-12 MED ORDER — HEPARIN SOD (PORK) LOCK FLUSH 100 UNIT/ML IV SOLN
500.0000 [IU] | Freq: Once | INTRAVENOUS | Status: AC | PRN
  Administered 2022-03-12: 500 [IU]

## 2022-03-12 NOTE — Progress Notes (Signed)
Hydration fluids given per orders. Patient tolerated it well without problems. Vitals stable and discharged home from clinic ambulatory. Follow up as scheduled.  

## 2022-03-12 NOTE — Patient Instructions (Signed)
[facility]  Discharge Instructions: Thank you for choosing [facility] to provide your oncology and hematology care.  If you have a lab appointment with the [facility], please come in thru the Main Entrance and check in at the main information desk.  Wear comfortable clothing and clothing appropriate for easy access to any Portacath or PICC line.   We strive to give you quality time with your provider. You may need to reschedule your appointment if you arrive late (15 or more minutes).  Arriving late affects you and other patients whose appointments are after yours.  Also, if you miss three or more appointments without notifying the office, you may be dismissed from the clinic at the provider's discretion.      For prescription refill requests, have your pharmacy contact our office and allow 72 hours for refills to be completed.    Today you received the following chemotherapy and/or immunotherapy agents hydration fluids today.       To help prevent nausea and vomiting after your treatment, we encourage you to take your nausea medication as directed.  BELOW ARE SYMPTOMS THAT SHOULD BE REPORTED IMMEDIATELY: *FEVER GREATER THAN 100.4 F (38 C) OR HIGHER *CHILLS OR SWEATING *NAUSEA AND VOMITING THAT IS NOT CONTROLLED WITH YOUR NAUSEA MEDICATION *UNUSUAL SHORTNESS OF BREATH *UNUSUAL BRUISING OR BLEEDING *URINARY PROBLEMS (pain or burning when urinating, or frequent urination) *BOWEL PROBLEMS (unusual diarrhea, constipation, pain near the anus) TENDERNESS IN MOUTH AND THROAT WITH OR WITHOUT PRESENCE OF ULCERS (sore throat, sores in mouth, or a toothache) UNUSUAL RASH, SWELLING OR PAIN  UNUSUAL VAGINAL DISCHARGE OR ITCHING   Items with * indicate a potential emergency and should be followed up as soon as possible or go to the Emergency Department if any problems should occur.  Please show the CHEMOTHERAPY ALERT CARD or IMMUNOTHERAPY ALERT CARD at check-in to the  Emergency Department and triage nurse.  Should you have questions after your visit or need to cancel or reschedule your appointment, please contact [facility] [phone]  and follow the prompts.  Office hours are 8:00 a.m. to 4:30 p.m. Monday - Friday. Please note that voicemails left after 4:00 p.m. may not be returned until the following business day.  We are closed weekends and major holidays. You have access to a nurse at all times for urgent questions. Please call the main number to the clinic [phone] and follow the prompts.  For any non-urgent questions, you may also contact your provider using MyChart. We now offer e-Visits for anyone 65 and older to request care online for non-urgent symptoms. For details visit [email or web address].   Also download the MyChart app! Go to the app store, search "MyChart", open the app, select [facility], and log in with your MyChart username and password.  Masks are optional in the cancer centers. If you would like for your care team to wear a mask while they are taking care of you, please let them know. For doctor visits, patients may have with them one support person who is at least 65 years old. At this time, visitors are not allowed in the infusion area.

## 2022-03-13 LAB — URINE CULTURE: Culture: >=100000 — AB

## 2022-03-15 ENCOUNTER — Other Ambulatory Visit: Payer: Self-pay

## 2022-03-16 ENCOUNTER — Inpatient Hospital Stay (HOSPITAL_COMMUNITY): Payer: Managed Care, Other (non HMO)

## 2022-03-16 VITALS — BP 158/79 | HR 84 | Temp 98.8°F | Resp 18

## 2022-03-16 DIAGNOSIS — C50812 Malignant neoplasm of overlapping sites of left female breast: Secondary | ICD-10-CM | POA: Diagnosis not present

## 2022-03-16 DIAGNOSIS — Z170 Estrogen receptor positive status [ER+]: Secondary | ICD-10-CM

## 2022-03-16 LAB — CBC WITH DIFFERENTIAL/PLATELET
Abs Immature Granulocytes: 1.4 10*3/uL — ABNORMAL HIGH (ref 0.00–0.07)
Basophils Absolute: 0 10*3/uL (ref 0.0–0.1)
Basophils Relative: 0 %
Eosinophils Absolute: 0 10*3/uL (ref 0.0–0.5)
Eosinophils Relative: 0 %
HCT: 27 % — ABNORMAL LOW (ref 36.0–46.0)
Hemoglobin: 8.4 g/dL — ABNORMAL LOW (ref 12.0–15.0)
Lymphocytes Relative: 7 %
Lymphs Abs: 1.2 10*3/uL (ref 0.7–4.0)
MCH: 33.6 pg (ref 26.0–34.0)
MCHC: 31.1 g/dL (ref 30.0–36.0)
MCV: 108 fL — ABNORMAL HIGH (ref 80.0–100.0)
Monocytes Absolute: 0.5 10*3/uL (ref 0.1–1.0)
Monocytes Relative: 3 %
Myelocytes: 8 %
Neutro Abs: 14.3 10*3/uL — ABNORMAL HIGH (ref 1.7–7.7)
Neutrophils Relative %: 82 %
Platelets: 181 10*3/uL (ref 150–400)
RBC: 2.5 MIL/uL — ABNORMAL LOW (ref 3.87–5.11)
RDW: 19.2 % — ABNORMAL HIGH (ref 11.5–15.5)
WBC: 17.4 10*3/uL — ABNORMAL HIGH (ref 4.0–10.5)
nRBC: 0 % (ref 0.0–0.2)

## 2022-03-16 LAB — COMPREHENSIVE METABOLIC PANEL
ALT: 29 U/L (ref 0–44)
AST: 16 U/L (ref 15–41)
Albumin: 3.9 g/dL (ref 3.5–5.0)
Alkaline Phosphatase: 179 U/L — ABNORMAL HIGH (ref 38–126)
Anion gap: 7 (ref 5–15)
BUN: 42 mg/dL — ABNORMAL HIGH (ref 8–23)
CO2: 24 mmol/L (ref 22–32)
Calcium: 9.8 mg/dL (ref 8.9–10.3)
Chloride: 105 mmol/L (ref 98–111)
Creatinine, Ser: 1.06 mg/dL — ABNORMAL HIGH (ref 0.44–1.00)
GFR, Estimated: 59 mL/min — ABNORMAL LOW (ref >60)
Glucose, Bld: 123 mg/dL — ABNORMAL HIGH (ref 70–99)
Potassium: 4.7 mmol/L (ref 3.5–5.1)
Sodium: 136 mmol/L (ref 135–145)
Total Bilirubin: 0.5 mg/dL (ref 0.3–1.2)
Total Protein: 6.9 g/dL (ref 6.5–8.1)

## 2022-03-16 LAB — MAGNESIUM: Magnesium: 1.6 mg/dL — ABNORMAL LOW (ref 1.7–2.4)

## 2022-03-16 MED ORDER — POTASSIUM CHLORIDE IN NACL 20-0.9 MEQ/L-% IV SOLN
Freq: Once | INTRAVENOUS | Status: AC
  Filled 2022-03-16: qty 1000

## 2022-03-16 MED ORDER — SODIUM CHLORIDE 0.9% FLUSH
10.0000 mL | Freq: Once | INTRAVENOUS | Status: AC | PRN
  Administered 2022-03-16: 10 mL

## 2022-03-16 MED ORDER — HEPARIN SOD (PORK) LOCK FLUSH 100 UNIT/ML IV SOLN
500.0000 [IU] | Freq: Once | INTRAVENOUS | Status: AC | PRN
  Administered 2022-03-16: 500 [IU]

## 2022-03-16 MED ORDER — MAGNESIUM SULFATE 2 GM/50ML IV SOLN
2.0000 g | Freq: Once | INTRAVENOUS | Status: AC
  Administered 2022-03-16: 2 g via INTRAVENOUS
  Filled 2022-03-16: qty 50

## 2022-03-16 NOTE — Patient Instructions (Signed)
[facility]  Discharge Instructions: Thank you for choosing [facility] to provide your oncology and hematology care.  If you have a lab appointment with the [facility], please come in thru the Main Entrance and check in at the main information desk.  Wear comfortable clothing and clothing appropriate for easy access to any Portacath or PICC line.   We strive to give you quality time with your provider. You may need to reschedule your appointment if you arrive late (15 or more minutes).  Arriving late affects you and other patients whose appointments are after yours.  Also, if you miss three or more appointments without notifying the office, you may be dismissed from the clinic at the provider's discretion.      For prescription refill requests, have your pharmacy contact our office and allow 72 hours for refills to be completed.    Today you received the following chemotherapy and/or immunotherapy agents house fluids      To help prevent nausea and vomiting after your treatment, we encourage you to take your nausea medication as directed.  BELOW ARE SYMPTOMS THAT SHOULD BE REPORTED IMMEDIATELY: *FEVER GREATER THAN 100.4 F (38 C) OR HIGHER *CHILLS OR SWEATING *NAUSEA AND VOMITING THAT IS NOT CONTROLLED WITH YOUR NAUSEA MEDICATION *UNUSUAL SHORTNESS OF BREATH *UNUSUAL BRUISING OR BLEEDING *URINARY PROBLEMS (pain or burning when urinating, or frequent urination) *BOWEL PROBLEMS (unusual diarrhea, constipation, pain near the anus) TENDERNESS IN MOUTH AND THROAT WITH OR WITHOUT PRESENCE OF ULCERS (sore throat, sores in mouth, or a toothache) UNUSUAL RASH, SWELLING OR PAIN  UNUSUAL VAGINAL DISCHARGE OR ITCHING   Items with * indicate a potential emergency and should be followed up as soon as possible or go to the Emergency Department if any problems should occur.  Please show the CHEMOTHERAPY ALERT CARD or IMMUNOTHERAPY ALERT CARD at check-in to the Emergency  Department and triage nurse.  Should you have questions after your visit or need to cancel or reschedule your appointment, please contact [facility] [phone]  and follow the prompts.  Office hours are 8:00 a.m. to 4:30 p.m. Monday - Friday. Please note that voicemails left after 4:00 p.m. may not be returned until the following business day.  We are closed weekends and major holidays. You have access to a nurse at all times for urgent questions. Please call the main number to the clinic [phone] and follow the prompts.  For any non-urgent questions, you may also contact your provider using MyChart. We now offer e-Visits for anyone 62 and older to request care online for non-urgent symptoms. For details visit [email or web address].   Also download the MyChart app! Go to the app store, search "MyChart", open the app, select [facility], and log in with your MyChart username and password.  Masks are optional in the cancer centers. If you would like for your care team to wear a mask while they are taking care of you, please let them know. For doctor visits, patients may have with them one support person who is at least 65 years old. At this time, visitors are not allowed in the infusion area.

## 2022-03-16 NOTE — Progress Notes (Signed)
Patient presents today for possible IV fluids per providers order.  Vital signs WNL.  Labs pending.  Patient states that she feels good today and has no new complaints.  Labs reviewed, Potassium 4.7, Magnesium 1.6,  Dr. Delton Coombes notified.  Message received from Anastasio Champion RN/Dr.Katragadda to proceed with House fluids.  Tolerated fluids without adverse affects.  Vital signs stable.  No complaints at this time.  Discharge from clinic ambulatory in stable condition.  Alert and oriented X 3.  Follow up with [facility] as scheduled.

## 2022-03-19 ENCOUNTER — Other Ambulatory Visit: Payer: Self-pay

## 2022-03-24 ENCOUNTER — Encounter (HOSPITAL_COMMUNITY): Payer: Self-pay

## 2022-03-24 ENCOUNTER — Ambulatory Visit (HOSPITAL_COMMUNITY)
Admission: RE | Admit: 2022-03-24 | Discharge: 2022-03-24 | Disposition: A | Discharge status: Home / Self Care | Payer: Managed Care, Other (non HMO) | Source: Ambulatory Visit | Attending: Hematology | Admitting: Hematology

## 2022-03-24 DIAGNOSIS — C50812 Malignant neoplasm of overlapping sites of left female breast: Secondary | ICD-10-CM | POA: Insufficient documentation

## 2022-03-24 DIAGNOSIS — Z170 Estrogen receptor positive status [ER+]: Secondary | ICD-10-CM | POA: Insufficient documentation

## 2022-03-24 MED ORDER — IOHEXOL 300 MG/ML  SOLN
100.0000 mL | Freq: Once | INTRAMUSCULAR | Status: AC | PRN
  Administered 2022-03-24: 100 mL via INTRAVENOUS

## 2022-03-24 MED ORDER — HEPARIN SOD (PORK) LOCK FLUSH 100 UNIT/ML IV SOLN
INTRAVENOUS | Status: AC
  Administered 2022-03-24: 500 [IU] via INTRAVENOUS
  Filled 2022-03-24: qty 5

## 2022-04-01 ENCOUNTER — Inpatient Hospital Stay: Payer: Managed Care, Other (non HMO)

## 2022-04-01 ENCOUNTER — Inpatient Hospital Stay (HOSPITAL_BASED_OUTPATIENT_CLINIC_OR_DEPARTMENT_OTHER): Payer: Managed Care, Other (non HMO) | Admitting: Hematology

## 2022-04-01 ENCOUNTER — Inpatient Hospital Stay: Payer: Managed Care, Other (non HMO) | Attending: Oncology

## 2022-04-01 DIAGNOSIS — C50812 Malignant neoplasm of overlapping sites of left female breast: Secondary | ICD-10-CM | POA: Insufficient documentation

## 2022-04-01 DIAGNOSIS — Z170 Estrogen receptor positive status [ER+]: Secondary | ICD-10-CM | POA: Diagnosis not present

## 2022-04-01 DIAGNOSIS — R918 Other nonspecific abnormal finding of lung field: Secondary | ICD-10-CM | POA: Insufficient documentation

## 2022-04-01 DIAGNOSIS — I10 Essential (primary) hypertension: Secondary | ICD-10-CM | POA: Insufficient documentation

## 2022-04-01 DIAGNOSIS — F17210 Nicotine dependence, cigarettes, uncomplicated: Secondary | ICD-10-CM | POA: Diagnosis not present

## 2022-04-01 DIAGNOSIS — Z5111 Encounter for antineoplastic chemotherapy: Secondary | ICD-10-CM | POA: Diagnosis present

## 2022-04-01 DIAGNOSIS — Z79811 Long term (current) use of aromatase inhibitors: Secondary | ICD-10-CM | POA: Diagnosis not present

## 2022-04-01 DIAGNOSIS — Z7901 Long term (current) use of anticoagulants: Secondary | ICD-10-CM | POA: Diagnosis not present

## 2022-04-01 DIAGNOSIS — Z79899 Other long term (current) drug therapy: Secondary | ICD-10-CM | POA: Diagnosis not present

## 2022-04-01 DIAGNOSIS — Z86718 Personal history of other venous thrombosis and embolism: Secondary | ICD-10-CM | POA: Diagnosis not present

## 2022-04-01 DIAGNOSIS — F419 Anxiety disorder, unspecified: Secondary | ICD-10-CM | POA: Insufficient documentation

## 2022-04-01 DIAGNOSIS — Z95828 Presence of other vascular implants and grafts: Secondary | ICD-10-CM

## 2022-04-01 LAB — CBC WITH DIFFERENTIAL/PLATELET
Abs Immature Granulocytes: 0.03 10*3/uL (ref 0.00–0.07)
Basophils Absolute: 0 10*3/uL (ref 0.0–0.1)
Basophils Relative: 1 %
Eosinophils Absolute: 0 10*3/uL (ref 0.0–0.5)
Eosinophils Relative: 0 %
HCT: 30.1 % — ABNORMAL LOW (ref 36.0–46.0)
Hemoglobin: 9.5 g/dL — ABNORMAL LOW (ref 12.0–15.0)
Immature Granulocytes: 1 %
Lymphocytes Relative: 34 %
Lymphs Abs: 1.5 10*3/uL (ref 0.7–4.0)
MCH: 34.1 pg — ABNORMAL HIGH (ref 26.0–34.0)
MCHC: 31.6 g/dL (ref 30.0–36.0)
MCV: 107.9 fL — ABNORMAL HIGH (ref 80.0–100.0)
Monocytes Absolute: 0.3 10*3/uL (ref 0.1–1.0)
Monocytes Relative: 6 %
Neutro Abs: 2.6 10*3/uL (ref 1.7–7.7)
Neutrophils Relative %: 58 %
Platelets: 213 10*3/uL (ref 150–400)
RBC: 2.79 MIL/uL — ABNORMAL LOW (ref 3.87–5.11)
RDW: 19.3 % — ABNORMAL HIGH (ref 11.5–15.5)
WBC: 4.5 10*3/uL (ref 4.0–10.5)
nRBC: 0 % (ref 0.0–0.2)

## 2022-04-01 LAB — COMPREHENSIVE METABOLIC PANEL
ALT: 17 U/L (ref 0–44)
AST: 14 U/L — ABNORMAL LOW (ref 15–41)
Albumin: 3.5 g/dL (ref 3.5–5.0)
Alkaline Phosphatase: 157 U/L — ABNORMAL HIGH (ref 38–126)
Anion gap: 6 (ref 5–15)
BUN: 16 mg/dL (ref 8–23)
CO2: 24 mmol/L (ref 22–32)
Calcium: 8.8 mg/dL — ABNORMAL LOW (ref 8.9–10.3)
Chloride: 112 mmol/L — ABNORMAL HIGH (ref 98–111)
Creatinine, Ser: 1.05 mg/dL — ABNORMAL HIGH (ref 0.44–1.00)
GFR, Estimated: 59 mL/min — ABNORMAL LOW (ref >60)
Glucose, Bld: 114 mg/dL — ABNORMAL HIGH (ref 70–99)
Potassium: 3.8 mmol/L (ref 3.5–5.1)
Sodium: 142 mmol/L (ref 135–145)
Total Bilirubin: 0.5 mg/dL (ref 0.3–1.2)
Total Protein: 6.9 g/dL (ref 6.5–8.1)

## 2022-04-01 LAB — MAGNESIUM: Magnesium: 1 mg/dL — ABNORMAL LOW (ref 1.7–2.4)

## 2022-04-01 MED ORDER — ACETAMINOPHEN 325 MG PO TABS
650.0000 mg | ORAL_TABLET | Freq: Once | ORAL | Status: AC
  Administered 2022-04-01: 650 mg via ORAL

## 2022-04-01 MED ORDER — TRASTUZUMAB-ANNS CHEMO 150 MG IV SOLR
6.0000 mg/kg | Freq: Once | INTRAVENOUS | Status: AC
  Administered 2022-04-01: 399 mg via INTRAVENOUS
  Filled 2022-04-01: qty 19

## 2022-04-01 MED ORDER — SODIUM CHLORIDE 0.9 % IV SOLN: Freq: Once | INTRAVENOUS | Status: AC

## 2022-04-01 MED ORDER — HEPARIN SOD (PORK) LOCK FLUSH 100 UNIT/ML IV SOLN
500.0000 [IU] | Freq: Once | INTRAVENOUS | Status: AC | PRN
  Administered 2022-04-01: 500 [IU]

## 2022-04-01 MED ORDER — ANASTROZOLE 1 MG PO TABS: 1.0000 mg | ORAL_TABLET | Freq: Every day | ORAL | 4 refills | Status: DC

## 2022-04-01 MED ORDER — SODIUM CHLORIDE 0.9% FLUSH
10.0000 mL | INTRAVENOUS | Status: DC | PRN
  Administered 2022-04-01: 10 mL

## 2022-04-01 MED ORDER — DIPHENHYDRAMINE HCL 25 MG PO CAPS
25.0000 mg | ORAL_CAPSULE | Freq: Once | ORAL | Status: AC
  Administered 2022-04-01: 25 mg via ORAL

## 2022-04-01 MED ORDER — SODIUM CHLORIDE 0.9 % IV SOLN
420.0000 mg | Freq: Once | INTRAVENOUS | Status: AC
  Administered 2022-04-01: 420 mg via INTRAVENOUS
  Filled 2022-04-01: qty 14

## 2022-04-01 MED ORDER — MAGNESIUM SULFATE 2 GM/50ML IV SOLN
2.0000 g | INTRAVENOUS | Status: AC
  Administered 2022-04-01 (×2): 2 g via INTRAVENOUS

## 2022-04-01 NOTE — Progress Notes (Signed)
[facility] [address], [place] [address]   CLINIC:  Medical Oncology/Hematology  PCP:  Celene Squibb, MD [address] [name] [address] [phone]   REASON FOR VISIT:  Follow-up for locally advanced left breast cancer  PRIOR THERAPY: TCHP x 6 cycles.  NGS Results: not done  CURRENT THERAPY: Trastuzumab and Herceptin every 21 days. BRIEF ONCOLOGIC HISTORY:  Oncology History  Breast cancer, left ([facility])  10/15/2021 Initial Diagnosis   Breast cancer, left ([facility])   11/04/2021 -  Chemotherapy   Patient is on Treatment Plan : BREAST  Docetaxel + Carboplatin + Trastuzumab + Pertuzumab  (TCHP) q21d       Genetic Testing   Negative genetic testing. No pathogenic variants identified on the [facility] Multi-Cancer+RNA panel. The report date is 11/02/2021.   The Multi-Cancer Panel + RNA offered by [facility] includes sequencing and/or deletion duplication testing of the following 84 genes: AIP, ALK, APC, ATM, AXIN2,BAP1,  BARD1, BLM, BMPR1A, BRCA1, BRCA2, BRIP1, CASR, CDC73, CDH1, CDK4, CDKN1B, CDKN1C, CDKN2A (p14ARF), CDKN2A (p16INK4a), CEBPA, CHEK2, CTNNA1, DICER1, DIS3L2, EGFR (c.2369C>T, p.Thr790Met variant only), EPCAM (Deletion/duplication testing only), FH, FLCN, GATA2, GPC3, GREM1 (Promoter region deletion/duplication testing only), HOXB13 (c.251G>A, p.Gly84Glu), HRAS, KIT, MAX, MEN1, MET, MITF (c.952G>A, p.Glu318Lys variant only), MLH1, MSH2, MSH3, MSH6, MUTYH, NBN, NF1, NF2, NTHL1, PALB2, PDGFRA, PHOX2B, PMS2, POLD1, POLE, POT1, PRKAR1A, PTCH1, PTEN, RAD50, RAD51C, RAD51D, RB1, RECQL4, RET, RUNX1, SDHAF2, SDHA (sequence changes only), SDHB, SDHC, SDHD, SMAD4, SMARCA4, SMARCB1, SMARCE1, STK11, SUFU, TERC, TERT, TMEM127, TP53, TSC1, TSC2, VHL, WRN and WT1.     CANCER STAGING:  Cancer Staging  Breast cancer, left ([facility]) Staging form: Breast, AJCC 8th Edition - Clinical stage from 10/15/2021: Stage IIA (cT3, cN1, cM0, G2, ER+, PR+, HER2+) -  Unsigned   INTERVAL HISTORY:  Traci Fuller, a 66 y.o. female, returns for follow-up of HER2 positive breast cancer.  She has completed 6 cycles of chemo therapy with Herceptin and pertuzumab.  She underwent CT scan for restaging.  She reported that she did not have any major side effects after last cycle.  Appetite and energy levels are 50%.  She had some diarrhea after drinking Ensure.  Numbness in the toes on and off at nighttime.  REVIEW OF SYSTEMS:  Review of Systems  Constitutional:  Negative for appetite change, fatigue and unexpected weight change.  Gastrointestinal:  Negative for diarrhea, nausea and vomiting.  Genitourinary:  Negative for dysuria.   Neurological:  Positive for numbness (x1).  All other systems reviewed and are negative.   PAST MEDICAL/SURGICAL HISTORY:  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 DES to midLAD, 75% stenosis of RCA not treated   Cancer [facility])    Current smoker    Essential hypertension    Family history of bladder cancer    Family history of breast cancer    Family history of leukemia    Family history of uterine cancer    Hyperlipidemia LDL goal <70    Myocardial infarction ([facility])    PONV (postoperative nausea and vomiting)    Port-A-Cath in place 10/29/2021   Past Surgical History:  Procedure Laterality Date   ABDOMINAL HYSTERECTOMY     BREAST BIOPSY Bilateral 10/16/2021   CORONARY STENT INTERVENTION N/A 01/10/2018   Procedure: CORONARY STENT INTERVENTION;  Surgeon: Jettie Booze, MD;  Location: [facility];  Service: Cardiovascular;  Laterality: N/A;  LEFT HEART CATH AND CORONARY ANGIOGRAPHY N/A 01/10/2018   Procedure: LEFT HEART CATH AND CORONARY ANGIOGRAPHY;  Surgeon: Jettie Booze, MD;  Location: [facility];  Service: Cardiovascular;  Laterality: N/A;   PORTACATH PLACEMENT Right 10/26/2021   Procedure: INSERTION PORT-A-CATH;  Surgeon: Virl Cagey, MD;  Location: [facility];  Service: General;  Laterality: Right;    SOCIAL HISTORY:  Social History   Socioeconomic History   Marital status: Married    Spouse name: Not on file   Number of children: Not on file   Years of education: Not on file   Highest education level: Not on file  Occupational History   Not on file  Tobacco Use   Smoking status: Every Day    Packs/day: 1.00    Years: 43.00    Total pack years: 43.00    Types: Cigarettes    Start date: 1976   Smokeless tobacco: Never  Vaping Use   Vaping Use: Never used  Substance and Sexual Activity   Alcohol use: Not Currently   Drug use: Never   Sexual activity: Yes  Other Topics Concern   Not on file  Social History Narrative   Not on file   Social Determinants of Health   Financial Resource Strain: Not on file  Food Insecurity: Not on file  Transportation Needs: Not on file  Physical Activity: Not on file  Stress: Not on file  Social Connections: Not on file  Intimate Partner Violence: Not on file    FAMILY HISTORY:  Family History  Problem Relation Age of Onset   Bladder Cancer Mother 71   Cervical cancer Mother        dx 85s, hysterectomy   Hypertension Father    Cirrhosis Father    Leukemia Brother 35   Liver cancer Brother 3   Breast cancer Maternal Aunt        dx 44s   Cancer Maternal Uncle        unk type d. 21   Cancer Paternal Aunt        unk type   Breast cancer Maternal Grandmother        dx 2s   Uterine cancer Maternal Grandmother     CURRENT MEDICATIONS:  Current Outpatient Medications  Medication Sig Dispense Refill   ALPRAZolam (XANAX) 0.25 MG tablet Take 1 tablet (0.25 mg total) by mouth 2 (two) times daily as needed for anxiety. 60 tablet 2   amLODipine (NORVASC) 10 MG tablet Take 10 mg by mouth daily.     Apixaban Starter Pack, 82m and 580m (ELIQUIS DVT/PE STARTER PACK) Take as directed on package: start with two-19m59mablets twice daily for 7 days. On day 8,  switch to one-19mg46mblet twice daily. 1 each 0   aspirin EC 81 MG tablet Take 1 tablet (81 mg total) by mouth daily. 90 tablet 3   CARBOPLATIN IV Inject into the vein every 21 ( twenty-one) days.     ciprofloxacin (CIPRO) 500 MG tablet Take 1 tablet (500 mg total) by mouth 2 (two) times daily. 10 tablet 0   dexamethasone (DECADRON) 4 MG tablet Take 2 tablets (8 mg total) by mouth daily. Take in the mornings x7 days, starting on the first day after your next cycle of chemotherapy. 14 tablet 0   diphenoxylate-atropine (LOMOTIL) 2.5-0.025 MG tablet Take 2 tablets by mouth 4 (four) times daily as needed for diarrhea or loose stools. 60 tablet 3   DOCEtaxel (TAXOTERE IV)  Inject into the vein every 21 ( twenty-one) days.     dronabinol (MARINOL) 5 MG capsule Take 1 capsule (5 mg total) by mouth 2 (two) times daily before a meal. 60 capsule 3   Evolocumab (REPATHA SURECLICK) 366 MG/ML SOAJ Inject 140 mg into the skin every 14 (fourteen) days. 2 mL 11   gabapentin (NEURONTIN) 300 MG capsule Take 300 mg by mouth 2 (two) times daily.     levocetirizine (XYZAL) 5 MG tablet Take 5 mg by mouth daily.     lidocaine-prilocaine (EMLA) cream Apply a small amount to port a cath site (do not rub in) and cover with plastic wrap 1 hour prior to chemotherapy appointments 30 g 3   magnesium oxide (MAG-OX) 400 (240 Mg) MG tablet Take 1 tablet (400 mg total) by mouth in the morning, at noon, and at bedtime. 90 tablet 3   metoprolol tartrate (LOPRESSOR) 25 MG tablet Take 1 tablet (25 mg total) by mouth 2 (two) times daily. Please call and schedule overdue appointment with Dr. Gwenlyn Found for further refills . SECOND ATTEMPT! (Patient taking differently: Take 25 mg by mouth daily as needed (if blood pressure is 120/80 or higher). Please call and schedule overdue appointment with Dr. Gwenlyn Found for further refills . SECOND ATTEMPT!) 30 tablet 0   OLANZapine (ZYPREXA) 5 MG tablet Take 1 tablet (5 mg total) by mouth at bedtime for 8 days.  Take at bedtime starting the evening after you have received your next cycle of chemotherapy (Wednesday 02/10/2022). 8 tablet 0   omeprazole (PRILOSEC) 20 MG capsule Take 20 mg by mouth daily.     ondansetron (ZOFRAN-ODT) 8 MG disintegrating tablet Take 1 tablet (8 mg total) by mouth every 8 (eight) hours. 30 tablet 0   pegfilgrastim-bmez (ZIEXTENZO) 6 MG/0.6ML injection Inject 6 mg into the skin every 21 ( twenty-one) days. AFTER chemotherapy.     Pertuzumab (PERJETA IV) Inject into the vein every 21 ( twenty-one) days.     prochlorperazine (COMPAZINE) 10 MG tablet Take 1 tablet (10 mg total) by mouth every 6 (six) hours as needed (Nausea or vomiting). 30 tablet 3   scopolamine (TRANSDERM-SCOP) 1 MG/3DAYS Place 1 patch (1.5 mg total) onto the skin every 3 (three) days. 10 patch 12   trastuzumab-anns (KANJINTI) 150 MG SOLR injection Inject 462 mg into the vein every 21 ( twenty-one) days.     No current facility-administered medications for this visit.    ALLERGIES:  No Known Allergies  PHYSICAL EXAM:  Performance status (ECOG): 1 - Symptomatic but completely ambulatory  There were no vitals filed for this visit. Wt Readings from Last 3 Encounters:  03/11/22 149 lb 4 oz (67.7 kg)  02/26/22 147 lb 9.6 oz (67 kg)  02/24/22 143 lb 11.8 oz (65.2 kg)   Physical Exam Vitals reviewed.  Constitutional:      Appearance: Normal appearance.  Cardiovascular:     Rate and Rhythm: Normal rate and regular rhythm.     Pulses: Normal pulses.     Heart sounds: Normal heart sounds.  Pulmonary:     Effort: Pulmonary effort is normal.     Breath sounds: Normal breath sounds.  Neurological:     General: No focal deficit present.     Mental Status: She is alert and oriented to person, place, and time.  Psychiatric:        Mood and Affect: Mood normal.        Behavior: Behavior normal.     LABORATORY  DATA:  I have reviewed the labs as listed.     Latest Ref Rng & Units 03/16/2022   11:59 AM  03/11/2022    8:35 AM 03/04/2022    8:51 AM  CBC  WBC 4.0 - 10.5 K/uL 17.4  6.3  11.3   Hemoglobin 12.0 - 15.0 g/dL 8.4  8.5  8.9   Hematocrit 36.0 - 46.0 % 27.0  27.5  28.2   Platelets 150 - 400 K/uL 181  205  153       Latest Ref Rng & Units 03/16/2022   11:59 AM 03/11/2022    8:35 AM 03/04/2022    8:51 AM  CMP  Glucose 70 - 99 mg/dL 123  117  116   BUN 8 - 23 mg/dL 42  21  20   Creatinine 0.44 - 1.00 mg/dL 1.06  0.95  1.04   Sodium 135 - 145 mmol/L 136  140  135   Potassium 3.5 - 5.1 mmol/L 4.7  4.1  4.2   Chloride 98 - 111 mmol/L 105  108  109   CO2 22 - 32 mmol/L _0 Calcium 8.9 - 10.3 mg/dL 9.8  9.4  8.9   Total Protein 6.5 - 8.1 g/dL 6.9  6.7  6.3   Total Bilirubin 0.3 - 1.2 mg/dL 0.5  0.4  0.6   Alkaline Phos 38 - 126 U/L 179  223  152   AST 15 - 41 U/L _1 ALT 0 - 44 U/L 29  44  35     DIAGNOSTIC IMAGING:  I have independently reviewed the scans and discussed with the patient. CT CHEST ABDOMEN PELVIS W CONTRAST  Result Date: 03/24/2022 CLINICAL DATA:  Left-sided breast cancer diagnosed earlier this year. On chemotherapy. * Tracking Code: BO * EXAM: CT CHEST, ABDOMEN, AND PELVIS WITH CONTRAST TECHNIQUE: Multidetector CT imaging of the chest, abdomen and pelvis was performed following the standard protocol during bolus administration of intravenous contrast. RADIATION DOSE REDUCTION: This exam was performed according to the departmental dose-optimization program which includes automated exposure control, adjustment of the mA and/or kV according to patient size and/or use of iterative reconstruction technique. CONTRAST:  153m OMNIPAQUE IOHEXOL 300 MG/ML  SOLN COMPARISON:  02/03/2022 chest CT.  Abdominopelvic CT of 01/06/2019. FINDINGS: CT CHEST FINDINGS Cardiovascular: Right-sided Port-A-Cath terminates in the low SVC. Aortic atherosclerosis. Tortuous thoracic aorta. Normal heart size, without pericardial effusion. Three vessel coronary artery calcification. No  central pulmonary embolism, on this non-dedicated study. Mediastinum/Nodes: No supraclavicular adenopathy. No residual or recurrent left axillary/subpectoral adenopathy. No mediastinal or hilar adenopathy. No internal mammary adenopathy. Lungs/Pleura: No pleural fluid.  Moderate centrilobular emphysema. Bilateral pulmonary nodules on the order of 3-4 mm and less. Many of these are subpleural in distribution and favored to represent subpleural lymph nodes. A left upper lobe nodule of 2 mm on 51/3 may be less conspicuous than on the prior. Similarly, more anterior and medial left upper lobe 3 mm nodule on 39/3 measured 5 mm on the prior. No new or enlarging nodules identified. Musculoskeletal: Left breast skin thickening is presumably treatment related. The previously described lateral left partially calcified breast mass is further less well-defined and difficult to directly measure. No acute osseous abnormality. CT ABDOMEN PELVIS FINDINGS Hepatobiliary: Normal liver. Small gallstones without acute cholecystitis or biliary duct dilatation. Pancreas: Normal, without mass or ductal dilatation. Spleen: Normal in size, without focal abnormality. Adrenals/Urinary Tract: Normal adrenal glands.  Mild renal cortical thinning bilaterally. No hydronephrosis. Normal urinary bladder. Stomach/Bowel: Normal stomach, without wall thickening. Normal colon, appendix, and terminal ileum. Normal small bowel. Vascular/Lymphatic: Aortic atherosclerosis. Similar infrarenal aortic dilatation at 2.9 cm. Pelvic venous system is difficult to evaluate secondary to bolus timing. However, there is definite nonocclusive thrombus within the left femoral vein including on images 103 through 110 of series 2. Proximal saphenous and more distal femoral venous occlusive or near occlusive thrombus including on 122 and 117/2. Favor thrombus within the left common iliac vein on 86/2. No abdominopelvic adenopathy. Reproductive: Hysterectomy.  No adnexal  mass. Other: No significant free fluid. Mild pelvic floor laxity. No evidence of omental or peritoneal disease. Musculoskeletal: Presacral fat and soft tissue density mass again identified. Example 4.0 x 2.5 cm on 100/2. Felt to be similar to on the prior CT (when remeasured). No acute osseous abnormality. IMPRESSION: CT CHEST IMPRESSION 1. No evidence of residual or recurrent thoracic adenopathy. Tiny pulmonary nodules are similar and slightly decreased, as detailed above. No new or progressive disease. 2. Aortic atherosclerosis (ICD10-I70.0), coronary artery atherosclerosis and emphysema (ICD10-J43.9). CT ABDOMEN AND PELVIS IMPRESSION 1.  No  evidence of metastatic disease in the abdomen or pelvis. 2. Filling defects throughout the proximal left lower extremity and pelvis, consistent with deep venous thrombosis. These results will be called to the ordering clinician or representative by the Radiologist Assistant, and communication documented in the PACS or [employer]. 3. Similar fat and soft tissue density mass within the presacral space. Differential considerations include myelolipoma, extramedullary hematopoiesis, lipoleiomyoma, teratoma, and less likely liposarcoma. This can be re-evaluated on follow-up routine imaging. 4. Cholelithiasis Electronically Signed   By: Abigail Miyamoto M.D.   On: 03/24/2022 14:32   US Venous Img Lower Unilateral Left  Result Date: 03/04/2022 CLINICAL DATA:  LEFT leg pain, redness and swelling x2 days. EXAM: LEFT LOWER EXTREMITY VENOUS DOPPLER ULTRASOUND TECHNIQUE: Gray-scale sonography with compression, as well as color and duplex ultrasound, were performed to evaluate the deep venous system(s) from the level of the common femoral vein through the popliteal and proximal calf veins. COMPARISON:  CT [facility], 01/05/2022. FINDINGS: VENOUS Heterogeneously hypoechoic filling defect with resulting lack of coaptation involving the LEFT GSV, from the level of the knee through the  saphenofemoral junction. Thrombus extension from the GSV at the [facility] to involve the proximal common femoral vein. Additional thrombus within 1 of the paired peroneal veins. Normal compressibility of the LEFT superficial femoral, and popliteal veins, as well as the visualized calf veins. Visualized portions of the profunda femoral vein is unremarkable. Doppler waveforms show normal direction of venous flow, normal respiratory plasticity and response to augmentation. Limited views of the contralateral common femoral vein are unremarkable. OTHER No abnormal fluid collection. Limitations: none IMPRESSION: 1. 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 2. Examination is POSITIVE for DVT within the LEFT lower extremity involving the CFV. Electronically Signed   By: Michaelle Birks M.D.   On: 03/04/2022 10:55     ASSESSMENT:  Locally advanced HER2 positive left breast cancer: - Patient felt lump over the last 1 year. - She noticed lump has gotten bigger and deformed since September 2022.  She has itching over the skin of the breast. - Left breast 4:00 biopsy: Invasive mammary carcinoma, grade 2, ER 100%, PR 70%, Ki-67 25%, HER2 FISH positive - Left breast 2:00 biopsy: Invasive ductal carcinoma, grade 2, ER 100%, PR 70%, Ki-67 25%, HER2 negative - Left axillary lymph  node biopsy: IDC, ER 100%, PR 50%, HER2 negative, E-cadherin positive - Bilateral breast MRI on 10/09/2021: Multiple enhancing masses with intervening non-mass enhancement involving most of the LOQ and part of the UOQ.  Abnormal enhancement spans approximately 12.4 x 6.4 x 7 cm.  Diffuse skin thickening/edema.  Multiple left axillary lymph nodes, largest node measuring approximately 2.4 cm in the lower axilla.  Indeterminate non-mass enhancement inferior to the biopsy site in the subareolar right breast measuring 1.5 x 0.9 x 0.8 cm. - Right breast biopsy UOQ on 10/01/2021: Fibroadenomatoid nodule with calcifications,  fibrocystic changes including apocrine metaplasia with no malignancy. -We have reviewed PET scan from 10/22/2021: Large left breast mass and numerous hypermetabolic left axillary and subpectoral lymph nodes.  2 hypermetabolic left upper lobe lung nodules measuring 7 mm with SUV 4.2 and 3.2 respectively.  No other hypermetabolic lung nodules.  No distant metastatic disease.  Nonspecific low-level subareolar activity in the right breast near the surgical clips and a single mildly hypermetabolic right axillary lymph node. - Echocardiogram shows EF 60 to 65%. - She has oligometastatic disease with 2 small lung nodules.  Biopsy is not feasible.  I have recommended TCHP regimen followed by restaging after 3 cycles. - Cycle 1 of TCHP on 11/04/2021.  Cycle 4 dose reduced with carboplatin 400 mg and docetaxel 60 Mg/M squared. - CT chest (02/03/2022): Left upper lobe lung nodule 0.3 cm previously 0.7 cm.  Left upper lobe lung nodule measures 0.5 cm, previously 0.7 cm.  Subpectoral lymph node has decreased to 0.6 from 1.8 x 1.2 cm.  Interval decrease in the breast mass.  No new areas seen.    Social/family history: - She lives at home with her husband and daughter.  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  Current active smoker, half pack per day since age 21. - Mother had cervical cancer and bladder cancer.  Maternal grandmother had ovarian cancer.  Brother died at age 48 from acute leukemia.  Another brother died of liver cancer.  Maternal half aunt had breast cancer.   PLAN:  Oligometastatic (two 7 mm lung lesions) HER2 positive left breast cancer: - She has completed 6 cycles of chemotherapy. - We have reviewed CT CAP (03/24/2022): No evidence of metastatic disease in the abdomen or pelvis.  Lung nodules are further decreased in size. - Recommend that she proceed with left mastectomy and lymph node biopsy.  She will follow-up with Dr. Constance Haw. - Talked about maintenance pertuzumab and  Herceptin every 3 weeks. - We also talked about starting her on anastrozole 1 mg tablet daily.  We discussed side effects in detail.  Will send prescription. - She will proceed with Herceptin and pertuzumab today.  RTC 3 weeks for follow-up.  2.  Right facial shingles/postherpetic right hemifacial pain: - Continue gabapentin 300 mg 3 times daily.  3.  Hypomagnesemia: - She will receive IV magnesium.  She cannot take oral magnesium due to nausea.  4.  Anxiety: - Continue Xanax 0.25 mg daily.  5.  Hypertension: - Blood pressure today is 137/92.  She may restart back on amlodipine.  6.  Left leg DVT (diagnosed 03/04/2022): - Continue Eliquis twice daily.  No bleeding issues.  7.  Bone health: - Will obtain baseline bone density test. - She was advised to start calcium and vitamin D twice daily.   Orders placed this encounter:  No orders of the defined types were placed in this encounter.    Derek Jack,  MD [place] [phone]   I, Thana Ates, am acting as a scribe for Dr. Derek Jack.  I, Derek Jack MD, have reviewed the above documentation for accuracy and completeness, and I agree with the above.

## 2022-04-01 NOTE — Patient Instructions (Addendum)
[facility] at [facility] Discharge Instructions   You were seen and examined today by Dr. Delton Coombes.  He reviewed the results of your lab work which are normal/stable with exception of your magnesium. It is extremely low at 1.0. We will give IV magnesium today in the clinic.   He reviewed the results of your CT scan which show improvement of the cancer in the lungs.   We will refer you to Dr. Constance Haw to discuss surgery.   We will start you on an estrogen blocking pill called anastrozole. This is one pill a day. This medication can cause weakening of the bones. Start taking calcium + Vitamin D (600 mg/400 units) and take twice daily. We will also get a bone density test as a baseline for your bone strength.   Restart amlodopine.   Return as scheduled in 3 weeks.    Thank you for choosing [facility] at [facility] to provide your oncology and hematology care.  To afford each patient quality time with our provider, please arrive at least 15 minutes before your scheduled appointment time.   If you have a lab appointment with the [facility] please come in thru the Main Entrance and check in at the main information desk.  You need to re-schedule your appointment should you arrive 10 or more minutes late.  We strive to give you quality time with our providers, and arriving late affects you and other patients whose appointments are after yours.  Also, if you no show three or more times for appointments you may be dismissed from the clinic at the providers discretion.     Again, thank you for choosing [facility].  Our hope is that these requests will decrease the amount of time that you wait before being seen by our physicians.       _____________________________________________________________  Should you have questions after your visit to [facility], please contact our office at [phone] and follow the prompts.   Our office hours are 8:00 a.m. and 4:30 p.m. Monday - Friday.  Please note that voicemails left after 4:00 p.m. may not be returned until the following business day.  We are closed weekends and major holidays.  You do have access to a nurse 24-7, just call the main number to the clinic [phone] and do not press any options, hold on the line and a nurse will answer the phone.    For prescription refill requests, have your pharmacy contact our office and allow 72 hours.    Due to Covid, you will need to wear a mask upon entering the hospital. If you do not have a mask, a mask will be given to you at the Main Entrance upon arrival. For doctor visits, patients may have 1 support person age 8 or older with them. For treatment visits, patients can not have anyone with them due to social distancing guidelines and our immunocompromised population.

## 2022-04-01 NOTE — Patient Instructions (Signed)
[facility]  Discharge Instructions: Thank you for choosing [facility] to provide your oncology and hematology care.  If you have a lab appointment with the [facility], please come in thru the Main Entrance and check in at the main information desk.  Wear comfortable clothing and clothing appropriate for easy access to any Portacath or PICC line.   We strive to give you quality time with your provider. You may need to reschedule your appointment if you arrive late (15 or more minutes).  Arriving late affects you and other patients whose appointments are after yours.  Also, if you miss three or more appointments without notifying the office, you may be dismissed from the clinic at the provider's discretion.      For prescription refill requests, have your pharmacy contact our office and allow 72 hours for refills to be completed.    Today you received the following chemotherapy and/or immunotherapy agents Kanjinti/Perjeta      To help prevent nausea and vomiting after your treatment, we encourage you to take your nausea medication as directed.  BELOW ARE SYMPTOMS THAT SHOULD BE REPORTED IMMEDIATELY: *FEVER GREATER THAN 100.4 F (38 C) OR HIGHER *CHILLS OR SWEATING *NAUSEA AND VOMITING THAT IS NOT CONTROLLED WITH YOUR NAUSEA MEDICATION *UNUSUAL SHORTNESS OF BREATH *UNUSUAL BRUISING OR BLEEDING *URINARY PROBLEMS (pain or burning when urinating, or frequent urination) *BOWEL PROBLEMS (unusual diarrhea, constipation, pain near the anus) TENDERNESS IN MOUTH AND THROAT WITH OR WITHOUT PRESENCE OF ULCERS (sore throat, sores in mouth, or a toothache) UNUSUAL RASH, SWELLING OR PAIN  UNUSUAL VAGINAL DISCHARGE OR ITCHING   Items with * indicate a potential emergency and should be followed up as soon as possible or go to the Emergency Department if any problems should occur.  Please show the CHEMOTHERAPY ALERT CARD or IMMUNOTHERAPY ALERT CARD at check-in to the  Emergency Department and triage nurse.  Should you have questions after your visit or need to cancel or reschedule your appointment, please contact [facility] [phone]  and follow the prompts.  Office hours are 8:00 a.m. to 4:30 p.m. Monday - Friday. Please note that voicemails left after 4:00 p.m. may not be returned until the following business day.  We are closed weekends and major holidays. You have access to a nurse at all times for urgent questions. Please call the main number to the clinic [phone] and follow the prompts.  For any non-urgent questions, you may also contact your provider using MyChart. We now offer e-Visits for anyone 56 and older to request care online for non-urgent symptoms. For details visit [email or web address].   Also download the MyChart app! Go to the app store, search "MyChart", open the app, select [facility], and log in with your MyChart username and password.  Masks are optional in the cancer centers. If you would like for your care team to wear a mask while they are taking care of you, please let them know. For doctor visits, patients may have with them one support person who is at least 65 years old. At this time, visitors are not allowed in the infusion area.

## 2022-04-01 NOTE — Progress Notes (Signed)
Patient presents today for Kanjinti/Perjeta pre providers order.  Vital signs within parameters for treatment.  Labs reviewed and Magnesium noted to be 1.0, MD aware.  Patient to receive 4 grams magnesium with treatment.  Tolerated infusion without adverse affects.  Vital signs stable.  No complaints at this time.  Discharge from clinic ambulatory in stable condition.  Alert and oriented X 3.  Follow up with [facility] as scheduled.

## 2022-04-02 ENCOUNTER — Other Ambulatory Visit (HOSPITAL_COMMUNITY): Payer: Self-pay | Admitting: Physician Assistant

## 2022-04-02 ENCOUNTER — Other Ambulatory Visit: Payer: Self-pay

## 2022-04-02 DIAGNOSIS — I82412 Acute embolism and thrombosis of left femoral vein: Secondary | ICD-10-CM

## 2022-04-09 ENCOUNTER — Telehealth: Payer: Self-pay

## 2022-04-09 ENCOUNTER — Other Ambulatory Visit: Payer: Self-pay | Admitting: Physician Assistant

## 2022-04-09 DIAGNOSIS — I82412 Acute embolism and thrombosis of left femoral vein: Secondary | ICD-10-CM

## 2022-04-09 MED ORDER — APIXABAN 5 MG PO TABS: 5.0000 mg | ORAL_TABLET | Freq: Two times a day (BID) | ORAL | 5 refills | Status: DC

## 2022-04-09 NOTE — Telephone Encounter (Signed)
I will write new prescription for Eliquis 5 mg twice daily since patient has finished starter pack.  Should be ready at patient's preferred pharmacy later this afternoon.

## 2022-04-15 ENCOUNTER — Encounter: Payer: Self-pay | Admitting: General Surgery

## 2022-04-15 ENCOUNTER — Ambulatory Visit (INDEPENDENT_AMBULATORY_CARE_PROVIDER_SITE_OTHER): Payer: Managed Care, Other (non HMO) | Admitting: General Surgery

## 2022-04-15 VITALS — BP 147/87 | HR 79 | Temp 98.1°F | Resp 16 | Ht 60.0 in | Wt 147.0 lb

## 2022-04-15 DIAGNOSIS — N6341 Unspecified lump in right breast, subareolar: Secondary | ICD-10-CM | POA: Diagnosis not present

## 2022-04-15 DIAGNOSIS — C773 Secondary and unspecified malignant neoplasm of axilla and upper limb lymph nodes: Secondary | ICD-10-CM

## 2022-04-15 DIAGNOSIS — C50912 Malignant neoplasm of unspecified site of left female breast: Secondary | ICD-10-CM | POA: Diagnosis not present

## 2022-04-15 DIAGNOSIS — I82412 Acute embolism and thrombosis of left femoral vein: Secondary | ICD-10-CM | POA: Diagnosis not present

## 2022-04-15 NOTE — Patient Instructions (Signed)
Total or Modified Radical Mastectomy A total mastectomy and a modified radical mastectomy are surgeries that are done as part of treatment for breast cancer. Both types involve removing a breast. In a total mastectomy (simple mastectomy), all breast tissue including the nipple will be removed. In a modified radical mastectomy, lymph nodes under the arm will be removed along with the breast and nipple. Some of the lining over the muscle tissues under the breast may also be removed. These procedures may also be used to help prevent breast cancer. A preventive (prophylactic) mastectomy may be done if you are at an increased risk of breast cancer due to harmful changes (mutations) in certain genes, such as the BRCA genes. In that case, the procedure involves removing both of your breasts. This can reduce your risk of developing breast cancer in the future. For a transgender person, a total mastectomy may be done as part of a surgical transition from female to female. Let your health care provider know about: Any allergies you have. All medicines you are taking, including vitamins, herbs, eye drops, creams, and over-the-counter medicines. Any problems you or family members have had with anesthetic medicines. Any bleeding problems you have. Any surgeries you have had. Any medical conditions you have. Whether you are pregnant or may be pregnant. What are the risks? Generally, this is a safe procedure. However, problems may occur, including: Infection. Bleeding. Allergic reactions to medicines. Scar tissue. Chest numbness, sensation of throbbing, or tingling on the side of the surgery. Fluid buildup under the skin flaps where your breast was removed (seroma). Stress or sadness from losing your breast. If you have the lymph nodes under your arm removed, you may have arm swelling, weakness, or numbness on the same side of your body as your surgery. What happens before the procedure? Medicines Ask your  health care provider about: Changing or stopping your regular medicines. This is especially important if you are taking diabetes medicines or blood thinners. Taking medicines such as aspirin and ibuprofen. These medicines can thin your blood. Do not take these medicines unless your health care provider tells you to take them. Taking over-the-counter medicines, vitamins, herbs, and supplements. General instructions You may be checked for extra fluid around your lymph nodes (lymphedema). Do not use any products that contain nicotine or tobacco before the procedure. These products include cigarettes, chewing tobacco, and vaping devices, such as e-cigarettes. If you need help quitting, ask your health care provider. Ask your health care provider about: How your surgery site will be marked. What steps will be taken to help prevent infection. These steps may include: Removing hair at the surgery site. Washing skin with a germ-killing soap. Taking antibiotic medicine. What happens during the procedure? An IV will be inserted into one of your veins. You will be given: A medicine to help you relax (sedative). A medicine to make you fall asleep (general anesthetic). A wide incision will be made around your nipple. The skin of the breast and the nipple inside the incision will be removed along with all breast tissue. Lymph nodes under the arm on the side of the tumor will be checked to see if the cancer has spread. If you are having a modified radical mastectomy: The lining over your chest muscles will be removed. The incision may be extended to reach the lymph nodes under your arm, or a second incision may be made. Lymph nodes will be removed. Breast tissue and lymph nodes that are removed will be sent  to the lab for testing. You may have a drainage tube inserted into your incision to collect fluid that builds up after surgery. This tube will be connected to a suction bulb on the outside of your body  to remove the fluid. Your incision or incisions will be closed with stitches (sutures), skin glue, or adhesive strips. A bandage (dressing) will be placed over your breast area. If lymph nodes were removed, a dressing will also be placed under your arm. The procedure may vary among health care providers and hospitals. What happens after the procedure? Your blood pressure, heart rate, breathing rate, and blood oxygen level will be monitored until you leave the hospital or clinic. You will be given pain medicine as needed. Your IV can be removed when you are able to eat and drink. You may have a drainage tube in place for 2-3 days to prevent a collection of blood (hematoma) from developing in the breast area. You will be given instructions about caring for the drain before you go home. A pressure bandage may be applied for 1-2 days to prevent bleeding or swelling. Ask your health care provider how to care for your pressure bandage at home. Summary In a total mastectomy (simple mastectomy), all breast tissue including the nipple will be removed. In a modified radical mastectomy, lymph nodes under the arm will be removed along with the breast and nipple, and the chest wall lining. Before the procedure, follow instructions from your health care provider about eating and drinking, and ask about changing or stopping your regular medicines. You may have a drainage tube inserted into your incision to collect fluid that builds up after surgery. This tube will be connected to a suction bulb on the outside of your body to remove the fluid. This information is not intended to replace advice given to you by your health care provider. Make sure you discuss any questions you have with your health care provider. Document Revised: 05/10/2021 Document Reviewed: 05/10/2021 Elsevier Patient Education  [place].

## 2022-04-15 NOTE — Progress Notes (Signed)
[facility] History and Physical  Reason for Referral: Metastatic left breast cancer, right breast mass   Chief Complaint   Surgical Scheduling     Traci Fuller is a 66 y.o. female.  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 She had an MRI and this demonstrated a large lateral mass and over 14 concerning nodes on that MRI on the left side. She has a right sided mass that was biopsied with MRI guidance and need for right breast mass excision at the time of her left breast surgery. She has finished all of her neoadjuvant therapy. She says she had some issues with acute kidney injury and required some hospitalization during her chemotherapy. She is feeling better now. She is still on herceptin.   She has not had any follow up imaging after her therapy. She had a DVT diagnosed on 02/2022 and has been on Eliquis.   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 DES to midLAD, 75% stenosis of RCA not treated   Cancer [facility])    Current smoker    Essential hypertension    Family history of bladder cancer    Family history of breast cancer    Family history of leukemia    Family history of uterine cancer    Hyperlipidemia LDL goal <70    Myocardial infarction ([place])    PONV (postoperative nausea and vomiting)    Port-A-Cath in place 10/29/2021    Past Surgical History:  Procedure Laterality Date   ABDOMINAL HYSTERECTOMY     BREAST BIOPSY Bilateral 10/16/2021   CORONARY STENT INTERVENTION N/A 01/10/2018   Procedure: CORONARY STENT INTERVENTION;  Surgeon: Jettie Booze, MD;  Location: [facility];  Service: Cardiovascular;  Laterality: N/A;   LEFT HEART CATH AND CORONARY ANGIOGRAPHY N/A 01/10/2018   Procedure: LEFT HEART CATH AND CORONARY ANGIOGRAPHY;  Surgeon: Jettie Booze, MD;  Location: [facility];  Service: Cardiovascular;  Laterality: N/A;   PORTACATH PLACEMENT Right 10/26/2021   Procedure: INSERTION PORT-A-CATH;  Surgeon: Virl Cagey, MD;  Location: [facility];  Service: General;  Laterality: Right;    Family History  Problem Relation Age of Onset   Bladder Cancer Mother 61   Cervical cancer Mother        dx 51s, hysterectomy   Hypertension Father    Cirrhosis Father    Leukemia Brother 18   Liver cancer Brother 38   Breast cancer Maternal Aunt        dx 59s   Cancer Maternal Uncle        unk type d. 69   Cancer Paternal Aunt        unk type   Breast cancer Maternal Grandmother        dx 88s   Uterine cancer Maternal Grandmother     Social History   Tobacco Use   Smoking status: Every Day    Packs/day: 1.00    Years: 43.00    Total pack years: 43.00    Types: Cigarettes    Start date: 1976   Smokeless tobacco: Never  Vaping Use   Vaping Use: Never used  Substance Use Topics   Alcohol use: Not Currently   Drug use: Never    Medications: I have reviewed the patient's current medications. Allergies as  of 04/15/2022   No Known Allergies      Medication List        Accurate as of April 15, 2022  4:35 PM. If you have any questions, ask your nurse or doctor.          STOP taking these medications    ciprofloxacin 500 MG tablet Commonly known as: Cipro Stopped by: Virl Cagey, MD   dexamethasone 4 MG tablet Commonly known as: DECADRON Stopped by: Virl Cagey, MD   diphenoxylate-atropine 2.5-0.025 MG tablet Commonly known as: LOMOTIL Stopped by: Virl Cagey, MD   dronabinol 5 MG capsule Commonly known as: MARINOL Stopped by: Virl Cagey, MD   levocetirizine 5 MG tablet Commonly known as: XYZAL Stopped by: Virl Cagey, MD   lidocaine-prilocaine cream Commonly known as: EMLA Stopped by: Virl Cagey, MD   magnesium oxide 400 (240 Mg) MG tablet Commonly known as: MAG-OX Stopped by:  Virl Cagey, MD   OLANZapine 5 MG tablet Commonly known as: ZyPREXA Stopped by: Virl Cagey, MD   omeprazole 20 MG capsule Commonly known as: PRILOSEC Stopped by: Virl Cagey, MD   ondansetron 8 MG disintegrating tablet Commonly known as: ZOFRAN-ODT Stopped by: Virl Cagey, MD   prochlorperazine 10 MG tablet Commonly known as: COMPAZINE Stopped by: Virl Cagey, MD   scopolamine 1 MG/3DAYS Commonly known as: TRANSDERM-SCOP Stopped by: Virl Cagey, MD       TAKE these medications    ALPRAZolam 0.25 MG tablet Commonly known as: XANAX Take 1 tablet (0.25 mg total) by mouth 2 (two) times daily as needed for anxiety.   amLODipine 10 MG tablet Commonly known as: NORVASC Take 10 mg by mouth daily.   anastrozole 1 MG tablet Commonly known as: ARIMIDEX Take 1 tablet (1 mg total) by mouth daily.   apixaban 5 MG Tabs tablet Commonly known as: ELIQUIS Take 1 tablet (5 mg total) by mouth 2 (two) times daily.   aspirin EC 81 MG tablet Take 1 tablet (81 mg total) by mouth daily.   CARBOPLATIN IV Inject into the vein every 21 ( twenty-one) days.   gabapentin 300 MG capsule Commonly known as: NEURONTIN Take 300 mg by mouth 2 (two) times daily.   Kanjinti 150 MG Solr injection Generic drug: trastuzumab-anns Inject 462 mg into the vein every 21 ( twenty-one) days.   metoprolol tartrate 25 MG tablet Commonly known as: LOPRESSOR Take 1 tablet (25 mg total) by mouth 2 (two) times daily. Please call and schedule overdue appointment with Dr. Gwenlyn Found for further refills . SECOND ATTEMPT! What changed:  when to take this reasons to take this   PERJETA IV Inject into the vein every 21 ( twenty-one) days.   Repatha SureClick 502 MG/ML Soaj Generic drug: Evolocumab Inject 140 mg into the skin every 14 (fourteen) days.   TAXOTERE IV Inject into the vein every 21 ( twenty-one) days.   Ziextenzo 6 MG/0.6ML injection Generic drug:  pegfilgrastim-bmez Inject 6 mg into the skin every 21 ( twenty-one) days. AFTER chemotherapy.         ROS:  A comprehensive review of systems was negative except for: Integument/breast: positive for left breast cancer  Blood pressure (!) 147/87, pulse 79, temperature 98.1 F (36.7 C), temperature source Oral, resp. rate 16, height 5' (1.524 m), weight 147 lb (66.7 kg), SpO2 98 %. Physical Exam Vitals reviewed.  HENT:     Head: Normocephalic.  Nose: Nose normal.  Eyes:     Extraocular Movements: Extraocular movements intact.  Cardiovascular:     Rate and Rhythm: Normal rate and regular rhythm.  Pulmonary:     Effort: Pulmonary effort is normal.     Breath sounds: Normal breath sounds.  Chest:  Breasts:    Right: No mass, skin change or tenderness.     Left: Mass and tenderness present.  Abdominal:     General: There is no distension.     Palpations: Abdomen is soft.     Tenderness: There is no abdominal tenderness.  Musculoskeletal:     Cervical back: Normal range of motion.  Lymphadenopathy:     Upper Body:     Left upper body: Axillary adenopathy present.  Skin:    General: Skin is warm.  Neurological:     General: No focal deficit present.     Mental Status: She is alert.  Psychiatric:        Mood and Affect: Mood normal.        Thought Content: Thought content normal.        Judgment: Judgment normal.     Results: Personally reviewed MRI and PET scan from prior to chemotherapy  CLINICAL DATA:  66 year old with recent diagnosis of multicentric LEFT breast invasive malignancy in the [place] and the LOWER OUTER QUADRANT and biopsy-proven metastatic disease to a LEFT axillary lymph node. She also had a benign core needle biopsy of calcifications in the UPPER OUTER QUADRANT of the RIGHT breast with pathology demonstrating fibroadenoma. Preoperative MRI evaluation to confirm extent of disease prior to definitive treatment.   EXAM: BILATERAL  BREAST MRI WITH AND WITHOUT CONTRAST   TECHNIQUE: Multiplanar, multisequence MR images of both breasts were obtained prior to and following the intravenous administration of 8 ml of Gadavist.   Three-dimensional MR images were rendered by post-processing of the original MR data on an independent workstation. The three-dimensional MR images were interpreted, and findings are reported in the following complete MRI report for this study. Three dimensional images were evaluated at the independent interpreting workstation using the DynaCAD thin client.   COMPARISON:  No prior MRI. Multiple recent mammograms and breast ultrasounds.   FINDINGS: Breast composition: c. Heterogeneous fibroglandular tissue.   Background parenchymal enhancement: Mild.   RIGHT breast: Blooming artifact in the subareolar location at the site of the recent benign stereotactic core needle biopsy, located within a hematoma which measures approximately 2.6 x 1.7 cm. Immediately inferior to the hematoma and blooming artifact is linear non-mass enhancement spanning approximately 1.5 x 0.9 x 0.8 cm (AP x transverse x craniocaudal), demonstrating plateau enhancement kinetics.   No suspicious mass or abnormal enhancement elsewhere. Benign T2 hyperintense and T1 hypointense cyst in the lower inner subareolar location.   LEFT breast: Multiple enhancing masses with intervening non-mass enhancement involving most of the LOWER OUTER QUADRANT and part of the upper UPPER OUTER QUADRANT. The abnormal enhancement spans approximately 12.4 x 6.4 x 7.0 cm (AP x transverse x craniocaudal), demonstrating rapid washout enhancement kinetics. Blooming artifact from the prior biopsies are present in the [place] and in the [place].   Extensive diffuse skin thickening/edema and trabecular thickening throughout the breast on the T2 weighted images., with minimal enhancement of the skin. Of note, the LEFT  breast is significantly smaller than the RIGHT.   Lymph nodes: Multiple LEFT axillary lymph nodes, the largest node measuring approximately 2.4 cm in the low axilla, associated with  blooming artifact from the biopsy marker clip. There are at least 14 visible abnormal nodes.   No pathologic lymphadenopathy elsewhere.   Ancillary findings:  None.   IMPRESSION: 1. Multiple enhancing masses with intervening non-mass enhancement involving most of the LOWER OUTER QUADRANT of the LEFT breast and also involving the [place], spanning approximately 12.4 x 6.4 x 7.0 cm. The previously biopsied masses are present within this extensive enhancement. 2. Diffuse skin thickening/edema and trabecular thickening throughout the LEFT breast, with minimal enhancement of the skin, likely indicating dermal involvement. 3. Multiple (at least 14) abnormal LEFT axillary lymph nodes. No pathologic lymphadenopathy elsewhere. 4. Indeterminate Non-mass enhancement inferior to the biopsy site in the subareolar RIGHT breast spanning 1.5 x 0.9 x 0.8 cm. This may reflect post biopsy change, but is associated with indeterminate plateau enhancement kinetics.   RECOMMENDATION: 1. MRI guided core needle biopsy of the non-mass enhancement in the subareolar RIGHT breast. 2. Treatment plan for the extensive metastatic LEFT breast cancer.   BI-RADS CATEGORY  4: Suspicious.     Electronically Signed   By: Evangeline Dakin M.D.    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 This was found to be concordant by Dr. Ammie Ferrier, with excision recommended.   Pathology results were discussed with the patient by telephone  with Dr. Curlene Labrum. The patient reported doing well after the biopsy with tenderness at the site. Post biopsy instructions and care were reviewed and questions were answered. The patient was encouraged to call [facility] for any additional concerns.   The patient has a recent diagnosis of LEFT breast cancer and should follow her outlined treatment plan.   Pathology results reported by Stacie Acres RN on 10/20/2021.     Electronically Signed   By: Ammie Ferrier M.D.   On: 10/20/2021 12:42   CLINICAL DATA:  Initial treatment strategy for bilateral breast cancer.   EXAM: NUCLEAR MEDICINE PET SKULL BASE TO THIGH   TECHNIQUE: 9.08 mCi F-18 FDG was injected intravenously. Full-ring PET imaging was performed from the skull base to thigh after the radiotracer. CT data was obtained and used for attenuation correction and anatomic localization.   Fasting blood glucose: 98 mg/dl   COMPARISON:  Breast MRI 10/16/2021.   FINDINGS: Mediastinal blood pool activity: SUV max 3.1   NECK:   No hypermetabolic cervical lymph nodes are identified.There are no lesions of the pharyngeal mucosal space.   Incidental CT findings: Bilateral carotid atherosclerosis.   CHEST:   There is a very large hypermetabolic mass laterally in the left breast, measuring up to 7.9 x 4.2 cm on image 119/3. This demonstrates intense hypermetabolic activity with an SUV max of 3.4. There are numerous hypermetabolic left axillary and subpectoral lymph nodes. Left axillary node measuring 1.2 cm on image 77/3 has an SUV max of 15.2. There is a small subpectoral node with an SUV max of 7.7. There is a mildly hypermetabolic left internal mammary node (SUV max 2.6).   There is nonspecific low-level metabolic activity within the retroareolar portion of the left breast (SUV max 3.6). There is a single mildly hypermetabolic right axillary lymph node, measuring 7 mm short axis on  image 102/3 (SUV max 1.9).   There are 2 hypermetabolic left upper lobe  pulmonary nodules. 7 mm nodule on image 24/7 has an SUV max of 4.2. A nodule more peripherally in the left upper lobe measuring 7 mm on image 29/7 has an SUV max of 3.2. No other hypermetabolic or suspicious pulmonary nodules.   Incidental CT findings: Mild centrilobular emphysema. Atherosclerosis of the aorta, great vessels and coronary arteries.   ABDOMEN/PELVIS:   There is no hypermetabolic activity within the liver, adrenal glands, spleen or pancreas. There is no hypermetabolic nodal activity.   Incidental CT findings: Diffuse aortic and branch vessel atherosclerosis with an infrarenal abdominal aortic aneurysm measuring up to 2.8 cm in diameter. Previous hysterectomy.   SKELETON:   There is no hypermetabolic activity to suggest osseous metastatic disease.   Incidental CT findings: none   IMPRESSION: 1. Findings are consistent with locally advanced left breast cancer. There is a large hypermetabolic mass laterally in the left breast with multiple hypermetabolic left axillary, subpectoral and internal mammary lymph nodes as well as 2 hypermetabolic left upper lobe pulmonary nodules, suspicious for metastases. 2. Nonspecific low-level subareolar activity in the right breast near the surgical clips with a single mildly hypermetabolic right axillary lymph node. 3. No distant metastases identified. 4. 2.8 cm infrarenal abdominal aortic aneurysm. Recommend follow-up every 5 years. Reference: J Am Coll Radiol 5400;86:761-950. 5. Coronary and aortic atherosclerosis (ICD10-I70.0). Emphysema (ICD10-J43.9).     Electronically Signed   By: Richardean Sale M.D.   On: 10/23/2021 16:13   Assessment & Plan:  SADAF PRZYBYSZ is a 66 y.o. female with an invasive left breast cancer with continued palpable mass extending lateral toward axilla and right  sided mass that needs excisional biopsy. Discussed  with patient and Dr. Delton Coombes and plan for modified radical mastectomy (complete axillary node dissection) and will tag the lymph node on the left that was biopsied to ensure that I get it since it was described as low on the original imaging.   Discussed excisional biopsy after tag placement on the right.   Discussed a risk of bleeding, infection, needing more surgery. Discussed that she would have on drain on the left and stay overnight. Discussed lymphedema. Discussed that given that the area was still palpable and taking up half of her breast I do not think she qualifies for partial mastectomy.  Discussed with Dr. Delton Coombes that she will need lovenox for bridging given that the DVT was in July. Will have her hold her Eliquis for 2 days prior to surgery and bridge with lovenox.  All questions were answered to the satisfaction of the patient and family.   Virl Cagey 04/15/2022, 4:35 PM

## 2022-04-16 MED ORDER — ENOXAPARIN SODIUM 60 MG/0.6ML IJ SOSY: 60.0000 mg | PREFILLED_SYRINGE | INTRAMUSCULAR | 0 refills | Status: DC

## 2022-04-19 ENCOUNTER — Encounter: Payer: Self-pay | Admitting: Hematology

## 2022-04-19 ENCOUNTER — Ambulatory Visit (HOSPITAL_COMMUNITY)
Admission: RE | Admit: 2022-04-19 | Discharge: 2022-04-19 | Disposition: A | Discharge status: Home / Self Care | Payer: Managed Care, Other (non HMO) | Source: Ambulatory Visit | Attending: Hematology | Admitting: Hematology

## 2022-04-19 ENCOUNTER — Encounter (HOSPITAL_COMMUNITY): Payer: Self-pay | Admitting: Hematology

## 2022-04-19 ENCOUNTER — Other Ambulatory Visit (HOSPITAL_COMMUNITY): Payer: Self-pay | Admitting: General Surgery

## 2022-04-19 DIAGNOSIS — C50912 Malignant neoplasm of unspecified site of left female breast: Secondary | ICD-10-CM

## 2022-04-19 DIAGNOSIS — Z170 Estrogen receptor positive status [ER+]: Secondary | ICD-10-CM | POA: Diagnosis present

## 2022-04-19 DIAGNOSIS — C50812 Malignant neoplasm of overlapping sites of left female breast: Secondary | ICD-10-CM | POA: Insufficient documentation

## 2022-04-19 DIAGNOSIS — N6099 Unspecified benign mammary dysplasia of unspecified breast: Secondary | ICD-10-CM

## 2022-04-22 ENCOUNTER — Inpatient Hospital Stay (HOSPITAL_BASED_OUTPATIENT_CLINIC_OR_DEPARTMENT_OTHER): Payer: Managed Care, Other (non HMO) | Admitting: Hematology

## 2022-04-22 ENCOUNTER — Inpatient Hospital Stay: Payer: Managed Care, Other (non HMO)

## 2022-04-22 VITALS — BP 114/72 | HR 62 | Resp 18

## 2022-04-22 DIAGNOSIS — C50812 Malignant neoplasm of overlapping sites of left female breast: Secondary | ICD-10-CM

## 2022-04-22 DIAGNOSIS — Z170 Estrogen receptor positive status [ER+]: Secondary | ICD-10-CM | POA: Diagnosis not present

## 2022-04-22 DIAGNOSIS — N6310 Unspecified lump in the right breast, unspecified quadrant: Secondary | ICD-10-CM

## 2022-04-22 DIAGNOSIS — Z95828 Presence of other vascular implants and grafts: Secondary | ICD-10-CM

## 2022-04-22 LAB — COMPREHENSIVE METABOLIC PANEL
ALT: 19 U/L (ref 0–44)
AST: 18 U/L (ref 15–41)
Albumin: 3.8 g/dL (ref 3.5–5.0)
Alkaline Phosphatase: 162 U/L — ABNORMAL HIGH (ref 38–126)
Anion gap: 6 (ref 5–15)
BUN: 34 mg/dL — ABNORMAL HIGH (ref 8–23)
CO2: 23 mmol/L (ref 22–32)
Calcium: 9.8 mg/dL (ref 8.9–10.3)
Chloride: 108 mmol/L (ref 98–111)
Creatinine, Ser: 1.41 mg/dL — ABNORMAL HIGH (ref 0.44–1.00)
GFR, Estimated: 42 mL/min — ABNORMAL LOW (ref >60)
Glucose, Bld: 106 mg/dL — ABNORMAL HIGH (ref 70–99)
Potassium: 4.6 mmol/L (ref 3.5–5.1)
Sodium: 137 mmol/L (ref 135–145)
Total Bilirubin: 0.9 mg/dL (ref 0.3–1.2)
Total Protein: 7.1 g/dL (ref 6.5–8.1)

## 2022-04-22 LAB — CBC WITH DIFFERENTIAL/PLATELET
Abs Immature Granulocytes: 0.03 10*3/uL (ref 0.00–0.07)
Basophils Absolute: 0.1 10*3/uL (ref 0.0–0.1)
Basophils Relative: 1 %
Eosinophils Absolute: 0.2 10*3/uL (ref 0.0–0.5)
Eosinophils Relative: 2 %
HCT: 34.1 % — ABNORMAL LOW (ref 36.0–46.0)
Hemoglobin: 10.9 g/dL — ABNORMAL LOW (ref 12.0–15.0)
Immature Granulocytes: 0 %
Lymphocytes Relative: 17 %
Lymphs Abs: 1.2 10*3/uL (ref 0.7–4.0)
MCH: 33.6 pg (ref 26.0–34.0)
MCHC: 32 g/dL (ref 30.0–36.0)
MCV: 105.2 fL — ABNORMAL HIGH (ref 80.0–100.0)
Monocytes Absolute: 0.5 10*3/uL (ref 0.1–1.0)
Monocytes Relative: 7 %
Neutro Abs: 5.1 10*3/uL (ref 1.7–7.7)
Neutrophils Relative %: 73 %
Platelets: 198 10*3/uL (ref 150–400)
RBC: 3.24 MIL/uL — ABNORMAL LOW (ref 3.87–5.11)
RDW: 16.4 % — ABNORMAL HIGH (ref 11.5–15.5)
WBC: 7.1 10*3/uL (ref 4.0–10.5)
nRBC: 0 % (ref 0.0–0.2)

## 2022-04-22 LAB — MAGNESIUM: Magnesium: 1.9 mg/dL (ref 1.7–2.4)

## 2022-04-22 MED ORDER — SODIUM CHLORIDE 0.9 % IV SOLN
420.0000 mg | Freq: Once | INTRAVENOUS | Status: AC
  Administered 2022-04-22: 420 mg via INTRAVENOUS
  Filled 2022-04-22: qty 14

## 2022-04-22 MED ORDER — SODIUM CHLORIDE 0.9 % IV SOLN: Freq: Once | INTRAVENOUS | Status: AC

## 2022-04-22 MED ORDER — DIPHENHYDRAMINE HCL 25 MG PO CAPS
25.0000 mg | ORAL_CAPSULE | Freq: Once | ORAL | Status: AC
  Administered 2022-04-22: 25 mg via ORAL
  Filled 2022-04-22: qty 1

## 2022-04-22 MED ORDER — HEPARIN SOD (PORK) LOCK FLUSH 100 UNIT/ML IV SOLN
500.0000 [IU] | Freq: Once | INTRAVENOUS | Status: AC | PRN
  Administered 2022-04-22: 500 [IU]

## 2022-04-22 MED ORDER — ACETAMINOPHEN 325 MG PO TABS
650.0000 mg | ORAL_TABLET | Freq: Once | ORAL | Status: AC
  Administered 2022-04-22: 650 mg via ORAL
  Filled 2022-04-22: qty 2

## 2022-04-22 MED ORDER — SODIUM CHLORIDE 0.9% FLUSH
10.0000 mL | INTRAVENOUS | Status: DC | PRN
  Administered 2022-04-22: 10 mL

## 2022-04-22 MED ORDER — TRASTUZUMAB-ANNS CHEMO 150 MG IV SOLR
6.0000 mg/kg | Freq: Once | INTRAVENOUS | Status: AC
  Administered 2022-04-22: 399 mg via INTRAVENOUS
  Filled 2022-04-22: qty 19

## 2022-04-22 NOTE — Progress Notes (Signed)
[facility] [address], [place] [address]   CLINIC:  Medical Oncology/Hematology  PCP:  Celene Squibb, MD [address] [name] [place] [address] [phone]   REASON FOR VISIT:  Follow-up for locally advanced left breast cancer  PRIOR THERAPY: TCHP x 6 cycles.  NGS Results: not done  CURRENT THERAPY: Anastrozole daily, trastuzumab and Herceptin every 21 days. BRIEF ONCOLOGIC HISTORY:  Oncology History  Breast cancer, left ([place])  10/15/2021 Initial Diagnosis   Breast cancer, left ([place])   11/04/2021 -  Chemotherapy   Patient is on Treatment Plan : BREAST  Docetaxel + Carboplatin + Trastuzumab + Pertuzumab  (TCHP) q21d       Genetic Testing   Negative genetic testing. No pathogenic variants identified on the [facility] Multi-Cancer+RNA panel. The report date is 11/02/2021.   The Multi-Cancer Panel + RNA offered by [facility] includes sequencing and/or deletion duplication testing of the following 84 genes: AIP, ALK, APC, ATM, AXIN2,BAP1,  BARD1, BLM, BMPR1A, BRCA1, BRCA2, BRIP1, CASR, CDC73, CDH1, CDK4, CDKN1B, CDKN1C, CDKN2A (p14ARF), CDKN2A (p16INK4a), CEBPA, CHEK2, CTNNA1, DICER1, DIS3L2, EGFR (c.2369C>T, p.Thr790Met variant only), EPCAM (Deletion/duplication testing only), FH, FLCN, GATA2, GPC3, GREM1 (Promoter region deletion/duplication testing only), HOXB13 (c.251G>A, p.Gly84Glu), HRAS, KIT, MAX, MEN1, MET, MITF (c.952G>A, p.Glu318Lys variant only), MLH1, MSH2, MSH3, MSH6, MUTYH, NBN, NF1, NF2, NTHL1, PALB2, PDGFRA, PHOX2B, PMS2, POLD1, POLE, POT1, PRKAR1A, PTCH1, PTEN, RAD50, RAD51C, RAD51D, RB1, RECQL4, RET, RUNX1, SDHAF2, SDHA (sequence changes only), SDHB, SDHC, SDHD, SMAD4, SMARCA4, SMARCB1, SMARCE1, STK11, SUFU, TERC, TERT, TMEM127, TP53, TSC1, TSC2, VHL, WRN and WT1.     CANCER STAGING:  Cancer Staging  Breast cancer, left ([place]) Staging form: Breast, AJCC 8th Edition - Clinical stage from 10/15/2021: Stage IIA (cT3, cN1, cM0, G2, ER+, PR+, HER2+)  - Unsigned   INTERVAL HISTORY:  Ms. NICKIE WARWICK, a 66 y.o. female, returns for follow-up of HER2 +2 breast cancer.  She was evaluated by Dr. Constance Haw.  She did not have any diarrhea after last treatment 3 weeks ago.  Energy levels are 50%.  Denies any new onset pains.  Denies any bleeding from her rectum or melena.  Hot flashes at nighttime have started since she started anastrozole but they are tolerable.  REVIEW OF SYSTEMS:  Review of Systems  Constitutional:  Negative for appetite change, fatigue and unexpected weight change.  Gastrointestinal:  Negative for diarrhea, nausea and vomiting.  Genitourinary:  Negative for dysuria.   Neurological:  Negative for numbness.  All other systems reviewed and are negative.   PAST MEDICAL/SURGICAL HISTORY:  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 DES to midLAD, 75% stenosis of RCA not treated   Cancer [facility])    Current smoker    Essential hypertension    Family history of bladder cancer    Family history of breast cancer    Family history of leukemia    Family history of uterine cancer    Hyperlipidemia LDL goal <70    Myocardial infarction ([place])    PONV (postoperative nausea and vomiting)    Port-A-Cath in place 10/29/2021   Past Surgical History:  Procedure Laterality Date   ABDOMINAL HYSTERECTOMY     BREAST BIOPSY Bilateral 10/16/2021   CORONARY STENT INTERVENTION N/A 01/10/2018   Procedure: CORONARY STENT INTERVENTION;  Surgeon: Jettie Booze, MD;  Location: [facility];  Service: Cardiovascular;  Laterality: N/A;   LEFT HEART  CATH AND CORONARY ANGIOGRAPHY N/A 01/10/2018   Procedure: LEFT HEART CATH AND CORONARY ANGIOGRAPHY;  Surgeon: Jettie Booze, MD;  Location: [facility];  Service: Cardiovascular;  Laterality: N/A;   PORTACATH PLACEMENT Right 10/26/2021   Procedure: INSERTION PORT-A-CATH;  Surgeon: Virl Cagey, MD;  Location: [facility];   Service: General;  Laterality: Right;    SOCIAL HISTORY:  Social History   Socioeconomic History   Marital status: Married    Spouse name: Not on file   Number of children: Not on file   Years of education: Not on file   Highest education level: Not on file  Occupational History   Not on file  Tobacco Use   Smoking status: Every Day    Packs/day: 1.00    Years: 43.00    Total pack years: 43.00    Types: Cigarettes    Start date: 1976   Smokeless tobacco: Never  Vaping Use   Vaping Use: Never used  Substance and Sexual Activity   Alcohol use: Not Currently   Drug use: Never   Sexual activity: Yes  Other Topics Concern   Not on file  Social History Narrative   Not on file   Social Determinants of Health   Financial Resource Strain: Not on file  Food Insecurity: Not on file  Transportation Needs: Not on file  Physical Activity: Not on file  Stress: Not on file  Social Connections: Not on file  Intimate Partner Violence: Not on file    FAMILY HISTORY:  Family History  Problem Relation Age of Onset   Bladder Cancer Mother 20   Cervical cancer Mother        dx 47s, hysterectomy   Hypertension Father    Cirrhosis Father    Leukemia Brother 11   Liver cancer Brother 63   Breast cancer Maternal Aunt        dx 58s   Cancer Maternal Uncle        unk type d. 20   Cancer Paternal Aunt        unk type   Breast cancer Maternal Grandmother        dx 57s   Uterine cancer Maternal Grandmother     CURRENT MEDICATIONS:  Current Outpatient Medications  Medication Sig Dispense Refill   ALPRAZolam (XANAX) 0.25 MG tablet Take 1 tablet (0.25 mg total) by mouth 2 (two) times daily as needed for anxiety. 60 tablet 2   amLODipine (NORVASC) 10 MG tablet Take 10 mg by mouth daily.     anastrozole (ARIMIDEX) 1 MG tablet Take 1 tablet (1 mg total) by mouth daily. 90 tablet 4   apixaban (ELIQUIS) 5 MG TABS tablet Take 1 tablet (5 mg total) by mouth 2 (two) times daily. 60 tablet  5   aspirin EC 81 MG tablet Take 1 tablet (81 mg total) by mouth daily. 90 tablet 3   CARBOPLATIN IV Inject into the vein every 21 ( twenty-one) days.     DOCEtaxel (TAXOTERE IV) Inject into the vein every 21 ( twenty-one) days.     enoxaparin (LOVENOX) 60 MG/0.6ML injection Inject 0.6 mLs (60 mg total) into the skin daily for 6 days. Hold Eliquis for 2 days prior to surgery. Take the lovenox each morning you do not take your Eliquis. No Eliquis or lovenox the day of surgery 3.6 mL 0   Evolocumab (REPATHA SURECLICK) 185 MG/ML SOAJ Inject 140 mg into the skin every 14 (fourteen) days. 2 mL 11  gabapentin (NEURONTIN) 300 MG capsule Take 300 mg by mouth 2 (two) times daily.     metoprolol tartrate (LOPRESSOR) 25 MG tablet Take 1 tablet (25 mg total) by mouth 2 (two) times daily. Please call and schedule overdue appointment with Dr. Gwenlyn Found for further refills . SECOND ATTEMPT! (Patient taking differently: Take 25 mg by mouth daily as needed (if blood pressure is 120/80 or higher). Please call and schedule overdue appointment with Dr. Gwenlyn Found for further refills . SECOND ATTEMPT!) 30 tablet 0   pegfilgrastim-bmez (ZIEXTENZO) 6 MG/0.6ML injection Inject 6 mg into the skin every 21 ( twenty-one) days. AFTER chemotherapy.     Pertuzumab (PERJETA IV) Inject into the vein every 21 ( twenty-one) days.     trastuzumab-anns (KANJINTI) 150 MG SOLR injection Inject 462 mg into the vein every 21 ( twenty-one) days.     No current facility-administered medications for this visit.    ALLERGIES:  No Known Allergies  PHYSICAL EXAM:  Performance status (ECOG): 1 - Symptomatic but completely ambulatory  There were no vitals filed for this visit. Wt Readings from Last 3 Encounters:  04/15/22 147 lb (66.7 kg)  04/01/22 148 lb 12.8 oz (67.5 kg)  03/11/22 149 lb 4 oz (67.7 kg)   Physical Exam Vitals reviewed.  Constitutional:      Appearance: Normal appearance.  Cardiovascular:     Rate and Rhythm: Normal rate  and regular rhythm.     Pulses: Normal pulses.     Heart sounds: Normal heart sounds.  Pulmonary:     Effort: Pulmonary effort is normal.     Breath sounds: Normal breath sounds.  Neurological:     General: No focal deficit present.     Mental Status: She is alert and oriented to person, place, and time.  Psychiatric:        Mood and Affect: Mood normal.        Behavior: Behavior normal.    LABORATORY DATA:  I have reviewed the labs as listed.     Latest Ref Rng & Units 04/01/2022    8:33 AM 03/16/2022   11:59 AM 03/11/2022    8:35 AM  CBC  WBC 4.0 - 10.5 K/uL 4.5  17.4  6.3   Hemoglobin 12.0 - 15.0 g/dL 9.5  8.4  8.5   Hematocrit 36.0 - 46.0 % 30.1  27.0  27.5   Platelets 150 - 400 K/uL 213  181  205       Latest Ref Rng & Units 04/01/2022    8:33 AM 03/16/2022   11:59 AM 03/11/2022    8:35 AM  CMP  Glucose 70 - 99 mg/dL 114  123  117   BUN 8 - 23 mg/dL 16  42  21   Creatinine 0.44 - 1.00 mg/dL 1.05  1.06  0.95   Sodium 135 - 145 mmol/L 142  136  140   Potassium 3.5 - 5.1 mmol/L 3.8  4.7  4.1   Chloride 98 - 111 mmol/L 112  105  108   CO2 22 - 32 mmol/L 24  24  26    Calcium 8.9 - 10.3 mg/dL 8.8  9.8  9.4   Total Protein 6.5 - 8.1 g/dL 6.9  6.9  6.7   Total Bilirubin 0.3 - 1.2 mg/dL 0.5  0.5  0.4   Alkaline Phos 38 - 126 U/L 157  179  223   AST 15 - 41 U/L 14  16  16    ALT 0 -  44 U/L 17  29  44     DIAGNOSTIC IMAGING:  I have independently reviewed the scans and discussed with the patient. DG Bone Density  Result Date: 04/19/2022 EXAM: DUAL X-RAY ABSORPTIOMETRY (DXA) FOR BONE MINERAL DENSITY IMPRESSION: Your patient Zaneta Lightcap completed a BMD test on 04/19/2022 using the [place] (software version: 14.10) manufactured by [employer]. The following summarizes the results of our evaluation. Technologist:[facility] PATIENT BIOGRAPHICAL: Name: Dazani, Norby Patient ID: [id number] Birth Date: [birth year] Height: 60.0 in. Gender: Female Exam  Date: 04/19/2022 Weight: 147.0 lbs. Indications: Breast Ca, Caucasian, Height Loss, Low Calcium Intake, Partial Hysterectomy, Post Menopausal, Tobacco User, Tobacco User (Current Smoker) Fractures: Treatments: Anastrozole, Asprin DENSITOMETRY RESULTS: Site         Region     Measured Date Measured Age WHO Classification Young Adult T-score BMD         %Change vs. Previous Significant Change (*) DualFemur Neck Left 04/19/2022 64.8 Osteopenia -1.9 0.779 g/cm2 - - DualFemur Total Mean 04/19/2022 64.8 Osteopenia -1.7 0.796 g/cm2 - - Left Forearm Radius 33% 04/19/2022 64.8 Osteopenia -2.1 0.560 g/cm2 - - ASSESSMENT: The BMD measured at Forearm Radius 33% is 0.560 g/cm2 with a T-score of -2.1. This patient is considered osteopenic according to [facility]) criteria. The scan quality is good. Lumbar spine was excluded due to advanced degenerative changes, however, was osteoporotic with those changes. World Pharmacologist [facility]) criteria for post-menopausal, Caucasian Women: Normal:       T-score at or above -1 SD Osteopenia:   T-score between -1 and -2.5 SD Osteoporosis: T-score at or below -2.5 SD RECOMMENDATIONS: 1. All patients should optimize calcium and vitamin D intake. 2. Consider FDA-approved medical therapies in postmenopausal women and med aged 28 years and older, based on the following: a. A hip or vertebral (clinical or morphometric) fracture b. T-score< -2.5 at the femoral neck or spine after appropriate evaluation to exclude secondary causes c. Low bone mass (T-score between -1.0 and -2.5 at the femoral neck or spine) and a 10-year probability of a hip fracture > 3% or a 10-year probability of a major osteoporosis-related fracture > 20% based on the [place]-adapted WHO algorithm d. Clinician judgment and/or patient preferences may indicate treatment for people with 10-year fracture probabilities above or below these levels FOLLOW-UP: Patients with diagnosis of osteoporosis or at high risk  for fracture should have regular bone mineral density tests. For patients eligible for Medicare, routine testing is allowed once every 2 years. The testing frequency can be increased to one year for patients who have rapidly progressing disease, those who are receiving or discontinuing medical therapy to restore bone mass, or have additional risk factors. I have reviewed this report, and agree with the above findings. Mark A. [place] Papas, M.D. [facility] Your patient ELZADA PYTEL completed a FRAX assessment on 04/19/2022 using the [place] (analysis version: 14.10) manufactured by [employer]. The following summarizes the results of our evaluation. PATIENT BIOGRAPHICAL: Name: Graceyn, Fodor Patient ID: [id number] Birth Date: 1955/10/30 Height:    60.0 in. Gender:     Female    [age]       Weight:    147.0 lbs. Ethnicity:  White                            Exam Date: 04/19/2022 FRAX* RESULTS:  (  version: 3.5) 10-year Probability of Fracture1 Major Osteoporotic Fracture2 Hip Fracture 10.3% 2.3% Population: [place] (Caucasian) Risk Factors: Tobacco User (Current Smoker) Based on Femur (Left) Neck BMD 1 -The 10-year probability of fracture may be lower than reported if the patient has received treatment. 2 -Major Osteoporotic Fracture: Clinical Spine, Forearm, Hip or Shoulder *FRAX is a Materials engineer of the [employer] for Metabolic Bone Disease, a [place] (WHO) [facility]. ASSESSMENT: The probability of a major osteoporotic fracture is 10.3% within the next ten years. The probability of a hip fracture is 2.3% within the next ten years. I have reviewed this report and agree with the above findings. Mark A. [place] Papas, M.D. [facility] Electronically Signed   By: Lavonia Dana M.D.   On: 04/19/2022 10:45   CT CHEST ABDOMEN PELVIS W CONTRAST  Result Date: 03/24/2022 CLINICAL DATA:  Left-sided breast cancer  diagnosed earlier this year. On chemotherapy. * Tracking Code: BO * EXAM: CT CHEST, ABDOMEN, AND PELVIS WITH CONTRAST TECHNIQUE: Multidetector CT imaging of the chest, abdomen and pelvis was performed following the standard protocol during bolus administration of intravenous contrast. RADIATION DOSE REDUCTION: This exam was performed according to the departmental dose-optimization program which includes automated exposure control, adjustment of the mA and/or kV according to patient size and/or use of iterative reconstruction technique. CONTRAST:  181m OMNIPAQUE IOHEXOL 300 MG/ML  SOLN COMPARISON:  02/03/2022 chest CT.  Abdominopelvic CT of 01/06/2019. FINDINGS: CT CHEST FINDINGS Cardiovascular: Right-sided Port-A-Cath terminates in the low SVC. Aortic atherosclerosis. Tortuous thoracic aorta. Normal heart size, without pericardial effusion. Three vessel coronary artery calcification. No central pulmonary embolism, on this non-dedicated study. Mediastinum/Nodes: No supraclavicular adenopathy. No residual or recurrent left axillary/subpectoral adenopathy. No mediastinal or hilar adenopathy. No internal mammary adenopathy. Lungs/Pleura: No pleural fluid.  Moderate centrilobular emphysema. Bilateral pulmonary nodules on the order of 3-4 mm and less. Many of these are subpleural in distribution and favored to represent subpleural lymph nodes. A left upper lobe nodule of 2 mm on 51/3 may be less conspicuous than on the prior. Similarly, more anterior and medial left upper lobe 3 mm nodule on 39/3 measured 5 mm on the prior. No new or enlarging nodules identified. Musculoskeletal: Left breast skin thickening is presumably treatment related. The previously described lateral left partially calcified breast mass is further less well-defined and difficult to directly measure. No acute osseous abnormality. CT ABDOMEN PELVIS FINDINGS Hepatobiliary: Normal liver. Small gallstones without acute cholecystitis or biliary duct  dilatation. Pancreas: Normal, without mass or ductal dilatation. Spleen: Normal in size, without focal abnormality. Adrenals/Urinary Tract: Normal adrenal glands. Mild renal cortical thinning bilaterally. No hydronephrosis. Normal urinary bladder. Stomach/Bowel: Normal stomach, without wall thickening. Normal colon, appendix, and terminal ileum. Normal small bowel. Vascular/Lymphatic: Aortic atherosclerosis. Similar infrarenal aortic dilatation at 2.9 cm. Pelvic venous system is difficult to evaluate secondary to bolus timing. However, there is definite nonocclusive thrombus within the left femoral vein including on images 103 through 110 of series 2. Proximal saphenous and more distal femoral venous occlusive or near occlusive thrombus including on 122 and 117/2. Favor thrombus within the left common iliac vein on 86/2. No abdominopelvic adenopathy. Reproductive: Hysterectomy.  No adnexal mass. Other: No significant free fluid. Mild pelvic floor laxity. No evidence of omental or peritoneal disease. Musculoskeletal: Presacral fat and soft tissue density mass again identified. Example 4.0 x 2.5 cm on 100/2. Felt to be similar to on the prior CT (when remeasured). No acute osseous abnormality. IMPRESSION:  CT CHEST IMPRESSION 1. No evidence of residual or recurrent thoracic adenopathy. Tiny pulmonary nodules are similar and slightly decreased, as detailed above. No new or progressive disease. 2. Aortic atherosclerosis (ICD10-I70.0), coronary artery atherosclerosis and emphysema (ICD10-J43.9). CT ABDOMEN AND PELVIS IMPRESSION 1.  No  evidence of metastatic disease in the abdomen or pelvis. 2. Filling defects throughout the proximal left lower extremity and pelvis, consistent with deep venous thrombosis. These results will be called to the ordering clinician or representative by the Radiologist Assistant, and communication documented in the PACS or [employer]. 3. Similar fat and soft tissue density mass within the  presacral space. Differential considerations include myelolipoma, extramedullary hematopoiesis, lipoleiomyoma, teratoma, and less likely liposarcoma. This can be re-evaluated on follow-up routine imaging. 4. Cholelithiasis Electronically Signed   By: Abigail Miyamoto M.D.   On: 03/24/2022 14:32     ASSESSMENT:  Locally advanced HER2 positive left breast cancer: - Patient felt lump over the last 1 year. - She noticed lump has gotten bigger and deformed since September 2022.  She has itching over the skin of the breast. - Left breast 4:00 biopsy: Invasive mammary carcinoma, grade 2, ER 100%, PR 70%, Ki-67 25%, HER2 FISH positive - Left breast 2:00 biopsy: Invasive ductal carcinoma, grade 2, ER 100%, PR 70%, Ki-67 25%, HER2 negative - Left axillary lymph node biopsy: IDC, ER 100%, PR 50%, HER2 negative, E-cadherin positive - Bilateral breast MRI on 10/09/2021: Multiple enhancing masses with intervening non-mass enhancement involving most of the LOQ and part of the UOQ.  Abnormal enhancement spans approximately 12.4 x 6.4 x 7 cm.  Diffuse skin thickening/edema.  Multiple left axillary lymph nodes, largest node measuring approximately 2.4 cm in the lower axilla.  Indeterminate non-mass enhancement inferior to the biopsy site in the subareolar right breast measuring 1.5 x 0.9 x 0.8 cm. - Right breast biopsy UOQ on 10/01/2021: Fibroadenomatoid nodule with calcifications, fibrocystic changes including apocrine metaplasia with no malignancy. -PET scan from 10/22/2021: Large left breast mass and numerous hypermetabolic left axillary and subpectoral lymph nodes.  2 hypermetabolic left upper lobe lung nodules measuring 7 mm with SUV 4.2 and 3.2 respectively.  No other hypermetabolic lung nodules.  No distant metastatic disease.  Nonspecific low-level subareolar activity in the right breast near the surgical clips and a single mildly hypermetabolic right axillary lymph node. - Echocardiogram shows EF 60 to 65%. 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  Biopsy is not feasible.  I have recommended TCHP regimen followed by restaging after 3 cycles. -6 cycles of dose reduced TCHP from 11/04/2021 through 03/11/2022 - CT chest (02/03/2022): Left upper lobe lung nodule 0.3 cm previously 0.7 cm.  Left upper lobe lung nodule measures 0.5 cm, previously 0.7 cm.  Subpectoral lymph node has decreased to 0.6 from 1.8 x 1.2 cm.  Interval decrease in the breast mass.  No new areas seen. -Maintenance Herceptin and pertuzumab started on 04/01/2022, anastrozole started on 04/01/2022   Social/family history: - She lives at home with her husband and daughter.  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  Current active smoker, half pack per day since age 66. - Mother had cervical cancer and bladder cancer.  Maternal grandmother had ovarian cancer.  Brother died at age 42 from acute leukemia.  Another brother died of liver cancer.  Maternal half aunt had breast cancer.   PLAN:  Oligometastatic (two 7 mm lung lesions) HER2 positive left breast cancer: - CT CAP (  03/24/2022): No evidence of metastatic disease in the abdomen or pelvis.  Lung nodules are further decreased in size. - She met with Dr. Constance Haw who is doing left mastectomy and lymph node biopsy and right lumpectomy. - Continue anastrozole which she is tolerating very well. - She is tolerating maintenance Herceptin and pertuzumab well.  Today she will proceed with next dose. - She will have surgery on 05/05/2022.  I will see her back in 6 weeks for follow-up to restart maintenance Herceptin and pertuzumab.  2.  Right facial shingles/postherpetic right hemifacial pain: - Continue gabapentin 300 mg 3 times daily.  3.  Hypomagnesemia: - She could not take oral magnesium supplements due to nausea.  However magnesium improved to 1.9 today.  4.  Anxiety: - Continue Xanax 0.25 mg daily.  5.  Hypertension: - Continue amlodipine.  Blood pressure is  110/70.  6.  Left leg DVT (diagnosed 03/04/2022): - Continue Eliquis twice daily.  No bleeding issues.  She will take last dose of Eliquis on the evening of 05/01/2022 and take Lovenox on 05/02/2022 through 05/04/2022.  Date of surgery is 05/05/2022.  7.  Bone health: - DEXA scan on 04/19/2022 with T score -2.1, osteopenia.       - Continue calcium and vitamin D supplements.   Orders placed this encounter:  No orders of the defined types were placed in this encounter.    Derek Jack, MD [place] [phone]

## 2022-04-22 NOTE — Patient Instructions (Addendum)
[place] at [facility] Discharge Instructions   You were seen and examined today by Dr. Delton Coombes.  He reviewed the results of your bone density test which shows you have osteopenia (weakening of the bones).   He reviewed the results of your lab work which are normal/stable. Your kidney function is a little elevated. We will give you extra IV fluid today to help correct this.   We will proceed with your treatment today.   Return as scheduled.    Thank you for choosing [place] at [facility] to provide your oncology and hematology care.  To afford each patient quality time with our provider, please arrive at least 15 minutes before your scheduled appointment time.   If you have a lab appointment with the [facility] please come in thru the Main Entrance and check in at the main information desk.  You need to re-schedule your appointment should you arrive 10 or more minutes late.  We strive to give you quality time with our providers, and arriving late affects you and other patients whose appointments are after yours.  Also, if you no show three or more times for appointments you may be dismissed from the clinic at the providers discretion.     Again, thank you for choosing [facility].  Our hope is that these requests will decrease the amount of time that you wait before being seen by our physicians.       _____________________________________________________________  Should you have questions after your visit to [facility], please contact our office at [phone] and follow the prompts.  Our office hours are 8:00 a.m. and 4:30 p.m. Monday - Friday.  Please note that voicemails left after 4:00 p.m. may not be returned until the following business day.  We are closed weekends and major holidays.  You do have access to a nurse 24-7, just call the main number to the clinic [phone] and do not press any  options, hold on the line and a nurse will answer the phone.    For prescription refill requests, have your pharmacy contact our office and allow 72 hours.    Due to Covid, you will need to wear a mask upon entering the hospital. If you do not have a mask, a mask will be given to you at the Main Entrance upon arrival. For doctor visits, patients may have 1 support person age 17 or older with them. For treatment visits, patients can not have anyone with them due to social distancing guidelines and our immunocompromised population.

## 2022-04-22 NOTE — Progress Notes (Signed)
Patient has been examined by Dr. Katragadda, and vital signs and labs have been reviewed. ANC, Creatinine, LFTs, hemoglobin, and platelets are within treatment parameters per M.D. - pt may proceed with treatment.  Primary RN and pharmacy notified.  

## 2022-04-22 NOTE — H&P (Signed)
[facility] History and Physical  Reason for Referral: Metastatic left breast cancer, right breast mass   Chief Complaint   Surgical Scheduling     Traci Fuller is a 66 y.o. female.  HPI: 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 She had an MRI and this demonstrated a large lateral mass and over 14 concerning nodes on that MRI on the left side. She has a right sided mass that was biopsied with MRI guidance and need for right breast mass excision at the time of her left breast surgery. She has finished all of her neoadjuvant therapy. She says she had some issues with acute kidney injury and required some hospitalization during her chemotherapy. She is feeling better now. She is still on herceptin.   She has not had any follow up imaging after her therapy. She had a DVT diagnosed on 02/2022 and has been on Eliquis.   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 DES to midLAD, 75% stenosis of RCA not treated   Cancer (HCC)    Current smoker    Essential hypertension    Family history of bladder cancer    Family history of breast cancer    Family history of leukemia    Family history of uterine cancer    Hyperlipidemia LDL goal <70    Myocardial infarction (HCC)    PONV (postoperative nausea and vomiting)    Port-A-Cath in place 10/29/2021    Past Surgical History:  Procedure Laterality Date   ABDOMINAL HYSTERECTOMY     BREAST BIOPSY Bilateral 10/16/2021   CORONARY STENT INTERVENTION N/A 01/10/2018   Procedure: CORONARY STENT INTERVENTION;  Surgeon: Varanasi, Jayadeep S, MD;  Location: [facility] INVASIVE CV LAB;  Service: Cardiovascular;  Laterality: N/A;   LEFT HEART CATH AND CORONARY ANGIOGRAPHY N/A 01/10/2018   Procedure: LEFT HEART CATH AND CORONARY ANGIOGRAPHY;  Surgeon: Varanasi, Jayadeep S, MD;  Location: [facility]  INVASIVE CV LAB;  Service: Cardiovascular;  Laterality: N/A;   PORTACATH PLACEMENT Right 10/26/2021   Procedure: INSERTION PORT-A-CATH;  Surgeon: Savreen Gebhardt C, MD;  Location: [facility] ORS;  Service: General;  Laterality: Right;    Family History  Problem Relation Age of Onset   Bladder Cancer Mother 66   Cervical cancer Mother        dx 40s, hysterectomy   Hypertension Father    Cirrhosis Father    Leukemia Brother 27   Liver cancer Brother 64   Breast cancer Maternal Aunt        dx 40s   Cancer Maternal Uncle        unk type d. 69   Cancer Paternal Aunt        unk type   Breast cancer Maternal Grandmother        dx 50s   Uterine cancer Maternal Grandmother     Social History   Tobacco Use   Smoking status: Every Day    Packs/day: 1.00    Years: 43.00    Total pack years: 43.00    Types: Cigarettes    Start date: 1976   Smokeless tobacco: Never  Vaping Use   Vaping Use: Never used  Substance Use Topics   Alcohol use: Not Currently   Drug use: Never    Medications: I have reviewed the patient's current medications. Allergies as   of 04/15/2022   No Known Allergies      Medication List        Accurate as of April 15, 2022  4:35 PM. If you have any questions, ask your nurse or doctor.          STOP taking these medications    ciprofloxacin 500 MG tablet Commonly known as: Cipro Stopped by: Loralyn Rachel C Mahamud Metts, MD   dexamethasone 4 MG tablet Commonly known as: DECADRON Stopped by: Laylani Pudwill C Mariangela Heldt, MD   diphenoxylate-atropine 2.5-0.025 MG tablet Commonly known as: LOMOTIL Stopped by: Locklan Canoy C Con Arganbright, MD   dronabinol 5 MG capsule Commonly known as: MARINOL Stopped by: Azani Brogdon C Augie Vane, MD   levocetirizine 5 MG tablet Commonly known as: XYZAL Stopped by: Becka Lagasse C Rontavious Albright, MD   lidocaine-prilocaine cream Commonly known as: EMLA Stopped by: Tabitha Riggins C Sachi Boulay, MD   magnesium oxide 400 (240 Mg) MG tablet Commonly known as: MAG-OX Stopped by:  Rosela Supak C Tria Noguera, MD   OLANZapine 5 MG tablet Commonly known as: ZyPREXA Stopped by: Arsenio Schnorr C Laurin Morgenstern, MD   omeprazole 20 MG capsule Commonly known as: PRILOSEC Stopped by: Soriah Leeman C Aleeta Schmaltz, MD   ondansetron 8 MG disintegrating tablet Commonly known as: ZOFRAN-ODT Stopped by: Kileen Lange C Dalila Arca, MD   prochlorperazine 10 MG tablet Commonly known as: COMPAZINE Stopped by: Lindey Renzulli C Hayzel Ruberg, MD   scopolamine 1 MG/3DAYS Commonly known as: TRANSDERM-SCOP Stopped by: Caira Poche C Dabney Dever, MD       TAKE these medications    ALPRAZolam 0.25 MG tablet Commonly known as: XANAX Take 1 tablet (0.25 mg total) by mouth 2 (two) times daily as needed for anxiety.   amLODipine 10 MG tablet Commonly known as: NORVASC Take 10 mg by mouth daily.   anastrozole 1 MG tablet Commonly known as: ARIMIDEX Take 1 tablet (1 mg total) by mouth daily.   apixaban 5 MG Tabs tablet Commonly known as: ELIQUIS Take 1 tablet (5 mg total) by mouth 2 (two) times daily.   aspirin EC 81 MG tablet Take 1 tablet (81 mg total) by mouth daily.   CARBOPLATIN IV Inject into the vein every 21 ( twenty-one) days.   gabapentin 300 MG capsule Commonly known as: NEURONTIN Take 300 mg by mouth 2 (two) times daily.   Kanjinti 150 MG Solr injection Generic drug: trastuzumab-anns Inject 462 mg into the vein every 21 ( twenty-one) days.   metoprolol tartrate 25 MG tablet Commonly known as: LOPRESSOR Take 1 tablet (25 mg total) by mouth 2 (two) times daily. Please call and schedule overdue appointment with Dr. Berry for further refills . SECOND ATTEMPT! What changed:  when to take this reasons to take this   PERJETA IV Inject into the vein every 21 ( twenty-one) days.   Repatha SureClick 140 MG/ML Soaj Generic drug: Evolocumab Inject 140 mg into the skin every 14 (fourteen) days.   TAXOTERE IV Inject into the vein every 21 ( twenty-one) days.   Ziextenzo 6 MG/0.6ML injection Generic drug:  pegfilgrastim-bmez Inject 6 mg into the skin every 21 ( twenty-one) days. AFTER chemotherapy.         ROS:  A comprehensive review of systems was negative except for: Integument/breast: positive for left breast cancer  Blood pressure (!) 147/87, pulse 79, temperature 98.1 F (36.7 C), temperature source Oral, resp. rate 16, height 5' (1.524 m), weight 147 lb (66.7 kg), SpO2 98 %. Physical Exam Vitals reviewed.  HENT:     Head: Normocephalic.       Nose: Nose normal.  Eyes:     Extraocular Movements: Extraocular movements intact.  Cardiovascular:     Rate and Rhythm: Normal rate and regular rhythm.  Pulmonary:     Effort: Pulmonary effort is normal.     Breath sounds: Normal breath sounds.  Chest:  Breasts:    Right: No mass, skin change or tenderness.     Left: Mass and tenderness present.  Abdominal:     General: There is no distension.     Palpations: Abdomen is soft.     Tenderness: There is no abdominal tenderness.  Musculoskeletal:     Cervical back: Normal range of motion.  Lymphadenopathy:     Upper Body:     Left upper body: Axillary adenopathy present.  Skin:    General: Skin is warm.  Neurological:     General: No focal deficit present.     Mental Status: She is alert.  Psychiatric:        Mood and Affect: Mood normal.        Thought Content: Thought content normal.        Judgment: Judgment normal.     Results: Personally reviewed MRI and PET scan from prior to chemotherapy  CLINICAL DATA:  65-year-old with recent diagnosis of multicentric LEFT breast invasive malignancy in the UPPER OUTER QUADRANT and the LOWER OUTER QUADRANT and biopsy-proven metastatic disease to a LEFT axillary lymph node. She also had a benign core needle biopsy of calcifications in the UPPER OUTER QUADRANT of the RIGHT breast with pathology demonstrating fibroadenoma. Preoperative MRI evaluation to confirm extent of disease prior to definitive treatment.   EXAM: BILATERAL  BREAST MRI WITH AND WITHOUT CONTRAST   TECHNIQUE: Multiplanar, multisequence MR images of both breasts were obtained prior to and following the intravenous administration of 8 ml of Gadavist.   Three-dimensional MR images were rendered by post-processing of the original MR data on an independent workstation. The three-dimensional MR images were interpreted, and findings are reported in the following complete MRI report for this study. Three dimensional images were evaluated at the independent interpreting workstation using the DynaCAD thin client.   COMPARISON:  No prior MRI. Multiple recent mammograms and breast ultrasounds.   FINDINGS: Breast composition: c. Heterogeneous fibroglandular tissue.   Background parenchymal enhancement: Mild.   RIGHT breast: Blooming artifact in the subareolar location at the site of the recent benign stereotactic core needle biopsy, located within a hematoma which measures approximately 2.6 x 1.7 cm. Immediately inferior to the hematoma and blooming artifact is linear non-mass enhancement spanning approximately 1.5 x 0.9 x 0.8 cm (AP x transverse x craniocaudal), demonstrating plateau enhancement kinetics.   No suspicious mass or abnormal enhancement elsewhere. Benign T2 hyperintense and T1 hypointense cyst in the lower inner subareolar location.   LEFT breast: Multiple enhancing masses with intervening non-mass enhancement involving most of the LOWER OUTER QUADRANT and part of the upper UPPER OUTER QUADRANT. The abnormal enhancement spans approximately 12.4 x 6.4 x 7.0 cm (AP x transverse x craniocaudal), demonstrating rapid washout enhancement kinetics. Blooming artifact from the prior biopsies are present in the UPPER OUTER QUADRANT and in the LOWER OUTER QUADRANT.   Extensive diffuse skin thickening/edema and trabecular thickening throughout the breast on the T2 weighted images., with minimal enhancement of the skin. Of note, the LEFT  breast is significantly smaller than the RIGHT.   Lymph nodes: Multiple LEFT axillary lymph nodes, the largest node measuring approximately 2.4 cm in the low axilla, associated with   blooming artifact from the biopsy marker clip. There are at least 14 visible abnormal nodes.   No pathologic lymphadenopathy elsewhere.   Ancillary findings:  None.   IMPRESSION: 1. Multiple enhancing masses with intervening non-mass enhancement involving most of the LOWER OUTER QUADRANT of the LEFT breast and also involving the UPPER OUTER QUADRANT, spanning approximately 12.4 x 6.4 x 7.0 cm. The previously biopsied masses are present within this extensive enhancement. 2. Diffuse skin thickening/edema and trabecular thickening throughout the LEFT breast, with minimal enhancement of the skin, likely indicating dermal involvement. 3. Multiple (at least 14) abnormal LEFT axillary lymph nodes. No pathologic lymphadenopathy elsewhere. 4. Indeterminate Non-mass enhancement inferior to the biopsy site in the subareolar RIGHT breast spanning 1.5 x 0.9 x 0.8 cm. This may reflect post biopsy change, but is associated with indeterminate plateau enhancement kinetics.   RECOMMENDATION: 1. MRI guided core needle biopsy of the non-mass enhancement in the subareolar RIGHT breast. 2. Treatment plan for the extensive metastatic LEFT breast cancer.   BI-RADS CATEGORY  4: Suspicious.     Electronically Signed   By: Thomas  Lawrence M.D.    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 This was found to be concordant by Dr. Michelle Collins, with excision recommended.   Pathology results were discussed with the patient by telephone  with Dr. Ziyan Hillmer. The patient reported doing well after the biopsy with tenderness at the site. Post biopsy instructions and care were reviewed and questions were answered. The patient was encouraged to call [facility] for any additional concerns.   The patient has a recent diagnosis of LEFT breast cancer and should follow her outlined treatment plan.   Pathology results reported by Susan Eaton RN on 10/20/2021.     Electronically Signed   By: Michelle  Collins M.D.   On: 10/20/2021 12:42   CLINICAL DATA:  Initial treatment strategy for bilateral breast cancer.   EXAM: NUCLEAR MEDICINE PET SKULL BASE TO THIGH   TECHNIQUE: 9.08 mCi F-18 FDG was injected intravenously. Full-ring PET imaging was performed from the skull base to thigh after the radiotracer. CT data was obtained and used for attenuation correction and anatomic localization.   Fasting blood glucose: 98 mg/dl   COMPARISON:  Breast MRI 10/16/2021.   FINDINGS: Mediastinal blood pool activity: SUV max 3.1   NECK:   No hypermetabolic cervical lymph nodes are identified.There are no lesions of the pharyngeal mucosal space.   Incidental CT findings: Bilateral carotid atherosclerosis.   CHEST:   There is a very large hypermetabolic mass laterally in the left breast, measuring up to 7.9 x 4.2 cm on image 119/3. This demonstrates intense hypermetabolic activity with an SUV max of 3.4. There are numerous hypermetabolic left axillary and subpectoral lymph nodes. Left axillary node measuring 1.2 cm on image 77/3 has an SUV max of 15.2. There is a small subpectoral node with an SUV max of 7.7. There is a mildly hypermetabolic left internal mammary node (SUV max 2.6).   There is nonspecific low-level metabolic activity within the retroareolar portion of the left breast (SUV max 3.6). There is a single mildly hypermetabolic right axillary lymph node, measuring 7 mm short axis on  image 102/3 (SUV max 1.9).   There are 2 hypermetabolic left upper lobe   pulmonary nodules. 7 mm nodule on image 24/7 has an SUV max of 4.2. A nodule more peripherally in the left upper lobe measuring 7 mm on image 29/7 has an SUV max of 3.2. No other hypermetabolic or suspicious pulmonary nodules.   Incidental CT findings: Mild centrilobular emphysema. Atherosclerosis of the aorta, great vessels and coronary arteries.   ABDOMEN/PELVIS:   There is no hypermetabolic activity within the liver, adrenal glands, spleen or pancreas. There is no hypermetabolic nodal activity.   Incidental CT findings: Diffuse aortic and branch vessel atherosclerosis with an infrarenal abdominal aortic aneurysm measuring up to 2.8 cm in diameter. Previous hysterectomy.   SKELETON:   There is no hypermetabolic activity to suggest osseous metastatic disease.   Incidental CT findings: none   IMPRESSION: 1. Findings are consistent with locally advanced left breast cancer. There is a large hypermetabolic mass laterally in the left breast with multiple hypermetabolic left axillary, subpectoral and internal mammary lymph nodes as well as 2 hypermetabolic left upper lobe pulmonary nodules, suspicious for metastases. 2. Nonspecific low-level subareolar activity in the right breast near the surgical clips with a single mildly hypermetabolic right axillary lymph node. 3. No distant metastases identified. 4. 2.8 cm infrarenal abdominal aortic aneurysm. Recommend follow-up every 5 years. Reference: J Am Coll Radiol 2013;10:789-794. 5. Coronary and aortic atherosclerosis (ICD10-I70.0). Emphysema (ICD10-J43.9).     Electronically Signed   By: William  Veazey M.D.   On: 10/23/2021 16:13   Assessment & Plan:  Doriana J Cortez is a 66 y.o. female with an invasive left breast cancer with continued palpable mass extending lateral toward axilla and right  sided mass that needs excisional biopsy. Discussed  with patient and Dr. Katragadda and plan for modified radical mastectomy (complete axillary node dissection) and will tag the lymph node on the left that was biopsied to ensure that I get it since it was described as low on the original imaging.   Discussed excisional biopsy after tag placement on the right.   Discussed a risk of bleeding, infection, needing more surgery. Discussed that she would have on drain on the left and stay overnight. Discussed lymphedema. Discussed that given that the area was still palpable and taking up half of her breast I do not think she qualifies for partial mastectomy.  Discussed with Dr. Katragadda that she will need lovenox for bridging given that the DVT was in July. Will have her hold her Eliquis for 2 days prior to surgery and bridge with lovenox.  All questions were answered to the satisfaction of the patient and family.   Aziah Brostrom C Nello Corro 04/15/2022, 4:35 PM       

## 2022-04-22 NOTE — Progress Notes (Signed)
Patient presents today for treatment and follow up visit with Dr. Delton Coombes. Labs reviewed by MD. Creatine 1.41. Labs within parameters for treatment. Vital signs within parameters for treatment.   Message received from A. Ouida Sills RN / Dr. Delton Coombes to proceed with treatment. Infuse 564m bolus of NS today with treatment.   Treatment given today per MD orders. Tolerated infusion without adverse affects. Vital signs stable. No complaints at this time. Discharged from clinic ambulatory in stable condition. Alert and oriented x 3. [facility]

## 2022-04-22 NOTE — Patient Instructions (Signed)
[facility]  Discharge Instructions: Thank you for choosing [facility] to provide your oncology and hematology care.  If you have a lab appointment with the [facility], please come in thru the Main Entrance and check in at the main information desk.  Wear comfortable clothing and clothing appropriate for easy access to any Portacath or PICC line.   We strive to give you quality time with your provider. You may need to reschedule your appointment if you arrive late (15 or more minutes).  Arriving late affects you and other patients whose appointments are after yours.  Also, if you miss three or more appointments without notifying the office, you may be dismissed from the clinic at the provider's discretion.      For prescription refill requests, have your pharmacy contact our office and allow 72 hours for refills to be completed.    Today you received the following chemotherapy and/or immunotherapy agents Herceptin/Perjetta.Trastuzumab Injection What is this medication? TRASTUZUMAB (tras TOO zoo mab) treats breast cancer and stomach cancer. It works by blocking a protein that causes cancer cells to grow and multiply. This helps to slow or stop the spread of cancer cells. This medicine may be used for other purposes; ask your health care provider or pharmacist if you have questions. COMMON BRAND NAME(S): Herceptin, Janae Bridgeman, Ontruzant, Trazimera What should I tell my care team before I take this medication? They need to know if you have any of these conditions: Heart failure Lung disease An unusual or allergic reaction to trastuzumab, other medications, foods, dyes, or preservatives Pregnant or trying to get pregnant Breast-feeding How should I use this medication? This medication is injected into a vein. It is given by your care team in a hospital or clinic setting. Talk to your care team about the use of this medication in children. It  is not approved for use in children. Overdosage: If you think you have taken too much of this medicine contact a poison control center or emergency room at once. NOTE: This medicine is only for you. Do not share this medicine with others. What if I miss a dose? Keep appointments for follow-up doses. It is important not to miss your dose. Call your care team if you are unable to keep an appointment. What may interact with this medication? Certain types of chemotherapy, such as daunorubicin, doxorubicin, epirubicin, idarubicin This list may not describe all possible interactions. Give your health care provider a list of all the medicines, herbs, non-prescription drugs, or dietary supplements you use. Also tell them if you smoke, drink alcohol, or use illegal drugs. Some items may interact with your medicine. What should I watch for while using this medication? Your condition will be monitored carefully while you are receiving this medication. This medication may make you feel generally unwell. This is not uncommon, as chemotherapy affects healthy cells as well as cancer cells. Report any side effects. Continue your course of treatment even though you feel ill unless your care team tells you to stop. This medication may increase your risk of getting an infection. Call your care team for advice if you get a fever, chills, sore throat, or other symptoms of a cold or flu. Do not treat yourself. Try to avoid being around people who are sick. Avoid taking medications that contain aspirin, acetaminophen, ibuprofen, naproxen, or ketoprofen unless instructed by your care team. These medications can hide a fever. Talk to your care team if you may  be pregnant. Serious birth defects can occur if you take this medication during pregnancy and for 7 months after the last dose. You will need a negative pregnancy test before starting this medication. Contraception is recommended while taking this medication and for 7  months after the last dose. Your care team can help you find the option that works for you. Do not breastfeed while taking this medication and for 7 months after stopping treatment. What side effects may I notice from receiving this medication? Side effects that you should report to your care team as soon as possible: Allergic reactions or angioedema--skin rash, itching or hives, swelling of the face, eyes, lips, tongue, arms, or legs, trouble swallowing or breathing Dry cough, shortness of breath or trouble breathing Heart failure--shortness of breath, swelling of the ankles, feet, or hands, sudden weight gain, unusual weakness or fatigue Infection--fever, chills, cough, or sore throat Infusion reactions--chest pain, shortness of breath or trouble breathing, feeling faint or lightheaded Side effects that usually do not require medical attention (report to your care team if they continue or are bothersome): Diarrhea Dizziness Headache Nausea Trouble sleeping Vomiting This list may not describe all possible side effects. Call your doctor for medical advice about side effects. You may report side effects to FDA at 1-800-FDA-1088. Where should I keep my medication? This medication is given in a hospital or clinic. It will not be stored at home. NOTE: This sheet is a summary. It may not cover all possible information. If you have questions about this medicine, talk to your doctor, pharmacist, or health care provider.  2023 Elsevier/Gold Standard (2021-12-22 00:00:00)  Pertuzumab Injection What is this medication? PERTUZUMAB (per TOOZ ue mab) treats breast cancer. It works by blocking a protein that causes cancer cells to grow and multiply. This helps to slow or stop the spread of cancer cells. It is a monoclonal antibody. This medicine may be used for other purposes; ask your health care provider or pharmacist if you have questions. COMMON BRAND NAME(S): PERJETA What should I tell my care  team before I take this medication? They need to know if you have any of these conditions: Heart failure An unusual or allergic reaction to pertuzumab, other medications, foods, dyes, or preservatives Pregnant or trying to get pregnant Breast-feeding How should I use this medication? This medication is injected into a vein. It is given by your care team in a hospital or clinic setting. Talk to your care team about the use of this medication in children. Special care may be needed. Overdosage: If you think you have taken too much of this medicine contact a poison control center or emergency room at once. NOTE: This medicine is only for you. Do not share this medicine with others. What if I miss a dose? Keep appointments for follow-up doses. It is important not to miss your dose. Call your care team if you are unable to keep an appointment. What may interact with this medication? Interactions are not expected. This list may not describe all possible interactions. Give your health care provider a list of all the medicines, herbs, non-prescription drugs, or dietary supplements you use. Also tell them if you smoke, drink alcohol, or use illegal drugs. Some items may interact with your medicine. What should I watch for while using this medication? Your condition will be monitored carefully while you are receiving this medication. This medication may make you feel generally unwell. This is not uncommon as chemotherapy can affect healthy cells as well  as cancer cells. Report any side effects. Continue your course of treatment even though you feel ill unless your care team tells you to stop. Talk to your care team if you may be pregnant. Serious birth defects can occur if you take this medication during pregnancy and for 7 months after the last dose. You will need a negative pregnancy test before starting this medication. Contraception is recommended while taking this medication and for 7 months after the  last dose. Your care team can help you find the option that works for you. Do not breastfeed while taking this medication and for 7 months after the last dose. What side effects may I notice from receiving this medication? Side effects that you should report to your care team as soon as possible: Allergic reactions or angioedema--skin rash, itching or hives, swelling of the face, eyes, lips, tongue, arms, or legs, trouble swallowing or breathing Heart failure--shortness of breath, swelling of the ankles, feet, or hands, sudden weight gain, unusual weakness or fatigue Infusion reactions--chest pain, shortness of breath or trouble breathing, feeling faint or lightheaded Side effects that usually do not require medical attention (report to your care team if they continue or are bothersome): Diarrhea Dry skin Fatigue Hair loss Nausea Vomiting This list may not describe all possible side effects. Call your doctor for medical advice about side effects. You may report side effects to FDA at 1-800-FDA-1088. Where should I keep my medication? This medication is given in a hospital or clinic. It will not be stored at home. NOTE: This sheet is a summary. It may not cover all possible information. If you have questions about this medicine, talk to your doctor, pharmacist, or health care provider.  2023 Elsevier/Gold Standard (2021-12-10 00:00:00)       To help prevent nausea and vomiting after your treatment, we encourage you to take your nausea medication as directed.  BELOW ARE SYMPTOMS THAT SHOULD BE REPORTED IMMEDIATELY: *FEVER GREATER THAN 100.4 F (38 C) OR HIGHER *CHILLS OR SWEATING *NAUSEA AND VOMITING THAT IS NOT CONTROLLED WITH YOUR NAUSEA MEDICATION *UNUSUAL SHORTNESS OF BREATH *UNUSUAL BRUISING OR BLEEDING *URINARY PROBLEMS (pain or burning when urinating, or frequent urination) *BOWEL PROBLEMS (unusual diarrhea, constipation, pain near the anus) TENDERNESS IN MOUTH AND THROAT WITH OR  WITHOUT PRESENCE OF ULCERS (sore throat, sores in mouth, or a toothache) UNUSUAL RASH, SWELLING OR PAIN  UNUSUAL VAGINAL DISCHARGE OR ITCHING   Items with * indicate a potential emergency and should be followed up as soon as possible or go to the Emergency Department if any problems should occur.  Please show the CHEMOTHERAPY ALERT CARD or IMMUNOTHERAPY ALERT CARD at check-in to the Emergency Department and triage nurse.  Should you have questions after your visit or need to cancel or reschedule your appointment, please contact [facility] [phone]  and follow the prompts.  Office hours are 8:00 a.m. to 4:30 p.m. Monday - Friday. Please note that voicemails left after 4:00 p.m. may not be returned until the following business day.  We are closed weekends and major holidays. You have access to a nurse at all times for urgent questions. Please call the main number to the clinic [phone] and follow the prompts.  For any non-urgent questions, you may also contact your provider using MyChart. We now offer e-Visits for anyone 28 and older to request care online for non-urgent symptoms. For details visit [email or web address].   Also download the MyChart app! Go to the app store, search "  MyChart", open the app, select [facility], and log in with your MyChart username and password.  Masks are optional in the cancer centers. If you would like for your care team to wear a mask while they are taking care of you, please let them know. You may have one support person who is at least 65 years old accompany you for your appointments.

## 2022-04-23 ENCOUNTER — Other Ambulatory Visit: Payer: Self-pay

## 2022-04-24 ENCOUNTER — Other Ambulatory Visit: Payer: Self-pay

## 2022-04-24 ENCOUNTER — Other Ambulatory Visit: Payer: Self-pay | Admitting: Hematology

## 2022-04-24 DIAGNOSIS — C50812 Malignant neoplasm of overlapping sites of left female breast: Secondary | ICD-10-CM

## 2022-04-24 DIAGNOSIS — Z170 Estrogen receptor positive status [ER+]: Secondary | ICD-10-CM

## 2022-04-24 DIAGNOSIS — Z95828 Presence of other vascular implants and grafts: Secondary | ICD-10-CM

## 2022-04-27 ENCOUNTER — Ambulatory Visit (HOSPITAL_COMMUNITY)
Admission: RE | Admit: 2022-04-27 | Discharge: 2022-04-27 | Disposition: A | Discharge status: Home / Self Care | Payer: Managed Care, Other (non HMO) | Source: Ambulatory Visit | Attending: General Surgery | Admitting: General Surgery

## 2022-04-27 ENCOUNTER — Other Ambulatory Visit (HOSPITAL_COMMUNITY): Payer: Self-pay | Admitting: General Surgery

## 2022-04-27 DIAGNOSIS — N6099 Unspecified benign mammary dysplasia of unspecified breast: Secondary | ICD-10-CM

## 2022-04-27 DIAGNOSIS — C50912 Malignant neoplasm of unspecified site of left female breast: Secondary | ICD-10-CM | POA: Diagnosis present

## 2022-04-27 MED ORDER — LIDOCAINE HCL (PF) 2 % IJ SOLN
INTRAMUSCULAR | Status: AC
  Filled 2022-04-27: qty 10

## 2022-04-28 ENCOUNTER — Other Ambulatory Visit: Payer: Self-pay

## 2022-04-28 NOTE — Progress Notes (Signed)
The following biosimilar Kanjinti (trastuzumab-anns) has been selected for use in this patient per insurance.  Henreitta Leber, PharmD 04/28/22 @ 1115

## 2022-04-30 ENCOUNTER — Encounter (HOSPITAL_COMMUNITY)
Admission: RE | Admit: 2022-04-30 | Discharge: 2022-04-30 | Disposition: A | Discharge status: Home / Self Care | Payer: Managed Care, Other (non HMO) | Source: Ambulatory Visit | Attending: General Surgery | Admitting: General Surgery

## 2022-04-30 ENCOUNTER — Other Ambulatory Visit: Payer: Self-pay

## 2022-05-05 ENCOUNTER — Encounter (HOSPITAL_COMMUNITY)
Admission: RE | Disposition: A | Discharge status: Home / Self Care | Payer: Self-pay | Source: Home / Self Care | Attending: General Surgery

## 2022-05-05 ENCOUNTER — Ambulatory Visit (HOSPITAL_COMMUNITY): Payer: Managed Care, Other (non HMO)

## 2022-05-05 ENCOUNTER — Ambulatory Visit (HOSPITAL_COMMUNITY): Payer: Managed Care, Other (non HMO) | Admitting: Anesthesiology

## 2022-05-05 ENCOUNTER — Observation Stay (HOSPITAL_COMMUNITY)
Admission: RE | Admit: 2022-05-05 | Discharge: 2022-05-06 | Disposition: A | Discharge status: Home / Self Care | Payer: Managed Care, Other (non HMO) | Attending: General Surgery | Admitting: General Surgery

## 2022-05-05 ENCOUNTER — Other Ambulatory Visit: Payer: Self-pay

## 2022-05-05 ENCOUNTER — Ambulatory Visit (HOSPITAL_BASED_OUTPATIENT_CLINIC_OR_DEPARTMENT_OTHER): Payer: Managed Care, Other (non HMO) | Admitting: Anesthesiology

## 2022-05-05 DIAGNOSIS — Z79899 Other long term (current) drug therapy: Secondary | ICD-10-CM | POA: Diagnosis not present

## 2022-05-05 DIAGNOSIS — C50012 Malignant neoplasm of nipple and areola, left female breast: Secondary | ICD-10-CM | POA: Diagnosis not present

## 2022-05-05 DIAGNOSIS — C50919 Malignant neoplasm of unspecified site of unspecified female breast: Secondary | ICD-10-CM | POA: Diagnosis present

## 2022-05-05 DIAGNOSIS — N6341 Unspecified lump in right breast, subareolar: Secondary | ICD-10-CM | POA: Diagnosis not present

## 2022-05-05 DIAGNOSIS — I10 Essential (primary) hypertension: Secondary | ICD-10-CM | POA: Insufficient documentation

## 2022-05-05 DIAGNOSIS — F17210 Nicotine dependence, cigarettes, uncomplicated: Secondary | ICD-10-CM | POA: Diagnosis not present

## 2022-05-05 DIAGNOSIS — Z803 Family history of malignant neoplasm of breast: Secondary | ICD-10-CM | POA: Diagnosis not present

## 2022-05-05 DIAGNOSIS — C773 Secondary and unspecified malignant neoplasm of axilla and upper limb lymph nodes: Secondary | ICD-10-CM | POA: Diagnosis not present

## 2022-05-05 DIAGNOSIS — I252 Old myocardial infarction: Secondary | ICD-10-CM

## 2022-05-05 DIAGNOSIS — C50912 Malignant neoplasm of unspecified site of left female breast: Secondary | ICD-10-CM

## 2022-05-05 DIAGNOSIS — Z170 Estrogen receptor positive status [ER+]: Secondary | ICD-10-CM

## 2022-05-05 DIAGNOSIS — N6310 Unspecified lump in the right breast, unspecified quadrant: Secondary | ICD-10-CM

## 2022-05-05 DIAGNOSIS — I2510 Atherosclerotic heart disease of native coronary artery without angina pectoris: Secondary | ICD-10-CM | POA: Insufficient documentation

## 2022-05-05 DIAGNOSIS — D0501 Lobular carcinoma in situ of right breast: Secondary | ICD-10-CM | POA: Insufficient documentation

## 2022-05-05 DIAGNOSIS — I25119 Atherosclerotic heart disease of native coronary artery with unspecified angina pectoris: Secondary | ICD-10-CM

## 2022-05-05 HISTORY — PX: MASTECTOMY MODIFIED RADICAL: SHX5962

## 2022-05-05 HISTORY — PX: BREAST BIOPSY WITH RADIO FREQUENCY LOCALIZER: SHX6895

## 2022-05-05 LAB — SURGICAL PCR SCREEN
MRSA, PCR: NEGATIVE
Staphylococcus aureus: NEGATIVE

## 2022-05-05 SURGERY — MASTECTOMY, MODIFIED RADICAL
Anesthesia: General | Laterality: Right

## 2022-05-05 MED ORDER — MUPIROCIN 2 % EX OINT: 1.0000 | TOPICAL_OINTMENT | Freq: Two times a day (BID) | CUTANEOUS | Status: DC

## 2022-05-05 MED ORDER — ACETAMINOPHEN 500 MG PO TABS
ORAL_TABLET | ORAL | Status: AC
  Filled 2022-05-05: qty 2

## 2022-05-05 MED ORDER — LACTATED RINGERS IV SOLN: INTRAVENOUS | Status: DC

## 2022-05-05 MED ORDER — OXYCODONE HCL 5 MG PO TABS
5.0000 mg | ORAL_TABLET | ORAL | Status: DC | PRN
  Administered 2022-05-05 – 2022-05-06 (×3): 10 mg via ORAL
  Filled 2022-05-05 (×3): qty 2

## 2022-05-05 MED ORDER — FENTANYL CITRATE (PF) 100 MCG/2ML IJ SOLN
INTRAMUSCULAR | Status: DC | PRN
  Administered 2022-05-05 (×8): 25 ug via INTRAVENOUS

## 2022-05-05 MED ORDER — METOPROLOL TARTRATE 5 MG/5ML IV SOLN: 5.0000 mg | Freq: Four times a day (QID) | INTRAVENOUS | Status: DC | PRN

## 2022-05-05 MED ORDER — ONDANSETRON 4 MG PO TBDP: 4.0000 mg | ORAL_TABLET | Freq: Four times a day (QID) | ORAL | Status: DC | PRN

## 2022-05-05 MED ORDER — SCOPOLAMINE 1 MG/3DAYS TD PT72
MEDICATED_PATCH | TRANSDERMAL | Status: AC
  Administered 2022-05-05: 1.5 mg
  Filled 2022-05-05: qty 1

## 2022-05-05 MED ORDER — BUPIVACAINE LIPOSOME 1.3 % IJ SUSP
INTRAMUSCULAR | Status: AC
  Filled 2022-05-05: qty 20

## 2022-05-05 MED ORDER — HYDROMORPHONE HCL 1 MG/ML IJ SOLN
0.2500 mg | INTRAMUSCULAR | Status: DC | PRN
  Administered 2022-05-05: 0.5 mg via INTRAVENOUS
  Filled 2022-05-05: qty 0.5

## 2022-05-05 MED ORDER — ONDANSETRON HCL 4 MG/2ML IJ SOLN
INTRAMUSCULAR | Status: DC | PRN
  Administered 2022-05-05 (×2): 4 mg via INTRAVENOUS

## 2022-05-05 MED ORDER — CEFAZOLIN SODIUM-DEXTROSE 2-4 GM/100ML-% IV SOLN
2.0000 g | INTRAVENOUS | Status: AC
  Administered 2022-05-05: 2 g via INTRAVENOUS

## 2022-05-05 MED ORDER — GABAPENTIN 300 MG PO CAPS
300.0000 mg | ORAL_CAPSULE | Freq: Two times a day (BID) | ORAL | Status: DC
  Administered 2022-05-05 – 2022-05-06 (×3): 300 mg via ORAL
  Filled 2022-05-05 (×3): qty 1

## 2022-05-05 MED ORDER — CEFAZOLIN SODIUM-DEXTROSE 2-4 GM/100ML-% IV SOLN
INTRAVENOUS | Status: AC
  Filled 2022-05-05: qty 100

## 2022-05-05 MED ORDER — ORAL CARE MOUTH RINSE: 15.0000 mL | Freq: Once | OROMUCOSAL | Status: AC

## 2022-05-05 MED ORDER — CHLORHEXIDINE GLUCONATE CLOTH 2 % EX PADS: 6.0000 | MEDICATED_PAD | Freq: Once | CUTANEOUS | Status: DC

## 2022-05-05 MED ORDER — ONDANSETRON HCL 4 MG/2ML IJ SOLN: 4.0000 mg | Freq: Four times a day (QID) | INTRAMUSCULAR | Status: DC | PRN

## 2022-05-05 MED ORDER — METHOCARBAMOL 1000 MG/10ML IJ SOLN: 500.0000 mg | Freq: Three times a day (TID) | INTRAVENOUS | Status: DC | PRN

## 2022-05-05 MED ORDER — FENTANYL CITRATE (PF) 100 MCG/2ML IJ SOLN
INTRAMUSCULAR | Status: AC
  Filled 2022-05-05: qty 2

## 2022-05-05 MED ORDER — MIDAZOLAM HCL 5 MG/5ML IJ SOLN
INTRAMUSCULAR | Status: DC | PRN
  Administered 2022-05-05: 2 mg via INTRAVENOUS

## 2022-05-05 MED ORDER — SIMETHICONE 80 MG PO CHEW: 40.0000 mg | CHEWABLE_TABLET | Freq: Four times a day (QID) | ORAL | Status: DC | PRN

## 2022-05-05 MED ORDER — PROPOFOL 500 MG/50ML IV EMUL
INTRAVENOUS | Status: AC
  Filled 2022-05-05: qty 50

## 2022-05-05 MED ORDER — ALPRAZOLAM 0.25 MG PO TABS: 0.2500 mg | ORAL_TABLET | Freq: Two times a day (BID) | ORAL | Status: DC | PRN

## 2022-05-05 MED ORDER — LIDOCAINE HCL (PF) 2 % IJ SOLN
INTRAMUSCULAR | Status: AC
  Filled 2022-05-05: qty 5

## 2022-05-05 MED ORDER — PHENYLEPHRINE 80 MCG/ML (10ML) SYRINGE FOR IV PUSH (FOR BLOOD PRESSURE SUPPORT)
PREFILLED_SYRINGE | INTRAVENOUS | Status: DC | PRN
  Administered 2022-05-05 (×5): 80 ug via INTRAVENOUS
  Administered 2022-05-05: 160 ug via INTRAVENOUS
  Administered 2022-05-05 (×2): 80 ug via INTRAVENOUS
  Administered 2022-05-05: 160 ug via INTRAVENOUS
  Administered 2022-05-05 (×3): 80 ug via INTRAVENOUS
  Administered 2022-05-05: 40 ug via INTRAVENOUS
  Administered 2022-05-05: 160 ug via INTRAVENOUS

## 2022-05-05 MED ORDER — METOPROLOL TARTRATE 25 MG PO TABS
25.0000 mg | ORAL_TABLET | Freq: Two times a day (BID) | ORAL | Status: DC
  Administered 2022-05-05 – 2022-05-06 (×2): 25 mg via ORAL
  Filled 2022-05-05 (×2): qty 1

## 2022-05-05 MED ORDER — PANTOPRAZOLE SODIUM 40 MG PO TBEC
40.0000 mg | DELAYED_RELEASE_TABLET | Freq: Every day | ORAL | Status: DC
  Administered 2022-05-05 – 2022-05-06 (×2): 40 mg via ORAL
  Filled 2022-05-05 (×2): qty 1

## 2022-05-05 MED ORDER — ROCURONIUM BROMIDE 10 MG/ML (PF) SYRINGE
PREFILLED_SYRINGE | INTRAVENOUS | Status: AC
  Filled 2022-05-05: qty 10

## 2022-05-05 MED ORDER — KETOROLAC TROMETHAMINE 30 MG/ML IJ SOLN
INTRAMUSCULAR | Status: AC
  Filled 2022-05-05: qty 3

## 2022-05-05 MED ORDER — SCOPOLAMINE 1 MG/3DAYS TD PT72: 1.0000 | MEDICATED_PATCH | Freq: Once | TRANSDERMAL | Status: DC

## 2022-05-05 MED ORDER — MELATONIN 3 MG PO TABS: 3.0000 mg | ORAL_TABLET | Freq: Every evening | ORAL | Status: DC | PRN

## 2022-05-05 MED ORDER — DEXAMETHASONE SODIUM PHOSPHATE 10 MG/ML IJ SOLN
INTRAMUSCULAR | Status: AC
  Filled 2022-05-05: qty 1

## 2022-05-05 MED ORDER — DEXAMETHASONE SODIUM PHOSPHATE 10 MG/ML IJ SOLN
INTRAMUSCULAR | Status: DC | PRN
  Administered 2022-05-05: 10 mg via INTRAVENOUS

## 2022-05-05 MED ORDER — ONDANSETRON HCL 4 MG/2ML IJ SOLN
INTRAMUSCULAR | Status: AC
  Filled 2022-05-05: qty 2

## 2022-05-05 MED ORDER — BUPIVACAINE LIPOSOME 1.3 % IJ SUSP
INTRAMUSCULAR | Status: DC | PRN
  Administered 2022-05-05: 20 mL

## 2022-05-05 MED ORDER — PROPOFOL 10 MG/ML IV BOLUS
INTRAVENOUS | Status: DC | PRN
  Administered 2022-05-05: 20 mg via INTRAVENOUS
  Administered 2022-05-05: 120 mg via INTRAVENOUS

## 2022-05-05 MED ORDER — ONDANSETRON HCL 4 MG/2ML IJ SOLN: 4.0000 mg | Freq: Once | INTRAMUSCULAR | Status: DC | PRN

## 2022-05-05 MED ORDER — DIPHENHYDRAMINE HCL 12.5 MG/5ML PO ELIX: 12.5000 mg | ORAL_SOLUTION | Freq: Four times a day (QID) | ORAL | Status: DC | PRN

## 2022-05-05 MED ORDER — ACETAMINOPHEN 500 MG PO TABS
1000.0000 mg | ORAL_TABLET | ORAL | Status: AC
  Administered 2022-05-05: 1000 mg via ORAL

## 2022-05-05 MED ORDER — METOPROLOL TARTRATE 5 MG/5ML IV SOLN
INTRAVENOUS | Status: DC | PRN
  Administered 2022-05-05: 1 mg via INTRAVENOUS

## 2022-05-05 MED ORDER — MORPHINE SULFATE (PF) 2 MG/ML IV SOLN
2.0000 mg | INTRAVENOUS | Status: DC | PRN
  Administered 2022-05-05: 2 mg via INTRAVENOUS
  Filled 2022-05-05: qty 1

## 2022-05-05 MED ORDER — CHLORHEXIDINE GLUCONATE 0.12 % MT SOLN
15.0000 mL | Freq: Once | OROMUCOSAL | Status: AC
  Administered 2022-05-05: 15 mL via OROMUCOSAL

## 2022-05-05 MED ORDER — DIPHENHYDRAMINE HCL 50 MG/ML IJ SOLN: 12.5000 mg | Freq: Four times a day (QID) | INTRAMUSCULAR | Status: DC | PRN

## 2022-05-05 MED ORDER — AMLODIPINE BESYLATE 5 MG PO TABS
10.0000 mg | ORAL_TABLET | Freq: Every day | ORAL | Status: DC
  Administered 2022-05-06: 10 mg via ORAL
  Filled 2022-05-05: qty 2

## 2022-05-05 MED ORDER — METHOCARBAMOL 500 MG PO TABS: 500.0000 mg | ORAL_TABLET | Freq: Three times a day (TID) | ORAL | Status: DC | PRN

## 2022-05-05 MED ORDER — LIDOCAINE 2% (20 MG/ML) 5 ML SYRINGE
INTRAMUSCULAR | Status: DC | PRN
  Administered 2022-05-05: 60 mg via INTRAVENOUS

## 2022-05-05 MED ORDER — DEXMEDETOMIDINE HCL IN NACL 80 MCG/20ML IV SOLN
INTRAVENOUS | Status: AC
  Filled 2022-05-05: qty 20

## 2022-05-05 MED ORDER — ACETAMINOPHEN 500 MG PO TABS
1000.0000 mg | ORAL_TABLET | Freq: Four times a day (QID) | ORAL | Status: DC
  Administered 2022-05-05 – 2022-05-06 (×2): 1000 mg via ORAL
  Filled 2022-05-05 (×2): qty 2

## 2022-05-05 MED ORDER — MIDAZOLAM HCL 2 MG/2ML IJ SOLN
INTRAMUSCULAR | Status: AC
  Filled 2022-05-05: qty 2

## 2022-05-05 MED ORDER — DOCUSATE SODIUM 100 MG PO CAPS
100.0000 mg | ORAL_CAPSULE | Freq: Two times a day (BID) | ORAL | Status: DC
  Administered 2022-05-05 – 2022-05-06 (×2): 100 mg via ORAL
  Filled 2022-05-05 (×3): qty 1

## 2022-05-05 MED ORDER — 0.9 % SODIUM CHLORIDE (POUR BTL) OPTIME
TOPICAL | Status: DC | PRN
  Administered 2022-05-05: 1000 mL

## 2022-05-05 SURGICAL SUPPLY — 57 items
APPLIER CLIP 11 MED OPEN (CLIP) ×2
APPLIER CLIP 9.375 SM OPEN (CLIP) ×4
BINDER BREAST LRG (GAUZE/BANDAGES/DRESSINGS) IMPLANT
BLADE SURG 15 STRL LF DISP TIS (BLADE) ×2 IMPLANT
BLADE SURG 15 STRL SS (BLADE) ×2
CHLORAPREP W/TINT 26 (MISCELLANEOUS) ×2 IMPLANT
CLIP APPLIE 11 MED OPEN (CLIP) IMPLANT
CLIP APPLIE 9.375 SM OPEN (CLIP) IMPLANT
CLOTH BEACON ORANGE TIMEOUT ST (SAFETY) ×2 IMPLANT
COVER LIGHT HANDLE STERIS (MISCELLANEOUS) ×4 IMPLANT
DERMABOND ADVANCED .7 DNX12 (GAUZE/BANDAGES/DRESSINGS) IMPLANT
DEVICE DUBIN W/COMP PLATE 8390 (MISCELLANEOUS) ×2 IMPLANT
DRAPE HALF SHEET 40X57 (DRAPES) IMPLANT
ELECT REM PT RETURN 9FT ADLT (ELECTROSURGICAL) ×2
ELECTRODE REM PT RTRN 9FT ADLT (ELECTROSURGICAL) ×2 IMPLANT
EVACUATOR DRAINAGE 10X20 100CC (DRAIN) ×2 IMPLANT
EVACUATOR SILICONE 100CC (DRAIN) ×4
GAUZE SPONGE 4X4 12PLY STRL (GAUZE/BANDAGES/DRESSINGS) ×2 IMPLANT
GLOVE BIO SURGEON STRL SZ 6.5 (GLOVE) ×2 IMPLANT
GLOVE BIOGEL M 7.0 STRL (GLOVE) IMPLANT
GLOVE BIOGEL PI IND STRL 6.5 (GLOVE) ×2 IMPLANT
GLOVE BIOGEL PI IND STRL 7.0 (GLOVE) ×4 IMPLANT
GOWN STRL REUS W/TWL LRG LVL3 (GOWN DISPOSABLE) ×6 IMPLANT
INST SET MINOR GENERAL (KITS) ×2 IMPLANT
KIT TURNOVER KIT A (KITS) ×2 IMPLANT
MANIFOLD NEPTUNE II (INSTRUMENTS) ×2 IMPLANT
NDL HYPO 18GX1.5 BLUNT FILL (NEEDLE) ×2 IMPLANT
NDL HYPO 21X1.5 SAFETY (NEEDLE) ×2 IMPLANT
NDL HYPO 25X1 1.5 SAFETY (NEEDLE) ×2 IMPLANT
NEEDLE HYPO 18GX1.5 BLUNT FILL (NEEDLE) ×2 IMPLANT
NEEDLE HYPO 21X1.5 SAFETY (NEEDLE) ×2 IMPLANT
NEEDLE HYPO 25X1 1.5 SAFETY (NEEDLE) ×2 IMPLANT
NS IRRIG 1000ML POUR BTL (IV SOLUTION) ×2 IMPLANT
PACK MINOR (CUSTOM PROCEDURE TRAY) ×2 IMPLANT
PAD ABD 5X9 TENDERSORB (GAUZE/BANDAGES/DRESSINGS) ×2 IMPLANT
PAD ARMBOARD 7.5X6 YLW CONV (MISCELLANEOUS) ×2 IMPLANT
PENCIL SMOKE EVACUATOR (MISCELLANEOUS) ×2 IMPLANT
SET BASIN LINEN APH (SET/KITS/TRAYS/PACK) ×2 IMPLANT
SET LOCALIZER 20 PROBE US (MISCELLANEOUS) ×2 IMPLANT
SPONGE DRAIN TRACH 4X4 STRL 2S (GAUZE/BANDAGES/DRESSINGS) ×2 IMPLANT
SPONGE GAUZE 2X2 8PLY STRL LF (GAUZE/BANDAGES/DRESSINGS) IMPLANT
SPONGE INTESTINAL PEANUT (DISPOSABLE) IMPLANT
SPONGE T-LAP 18X18 ~~LOC~~+RFID (SPONGE) ×4 IMPLANT
STAPLER VISISTAT (STAPLE) ×2 IMPLANT
STRIP CLOSURE SKIN 1/4X3 (GAUZE/BANDAGES/DRESSINGS) IMPLANT
SUT ETHILON 3 0 FSL (SUTURE) ×2 IMPLANT
SUT MNCRL AB 4-0 PS2 18 (SUTURE) ×2 IMPLANT
SUT SILK 2 0 (SUTURE) ×2
SUT SILK 2 0 SH (SUTURE) ×2 IMPLANT
SUT SILK 2-0 18XBRD TIE 12 (SUTURE) ×2 IMPLANT
SUT VIC AB 2-0 CT1 27 (SUTURE) ×12
SUT VIC AB 2-0 CT1 TAPERPNT 27 (SUTURE) ×6 IMPLANT
SUT VIC AB 3-0 SH 27 (SUTURE) ×2
SUT VIC AB 3-0 SH 27X BRD (SUTURE) ×2 IMPLANT
SUT VICRYL AB 2 0 TIES (SUTURE) IMPLANT
SYR 20ML LL LF (SYRINGE) ×2 IMPLANT
SYR CONTROL 10ML LL (SYRINGE) ×2 IMPLANT

## 2022-05-05 NOTE — Progress Notes (Signed)
[facility]  Patient doing well this afternoon. Up to use bathroom. PT has worked with her today.   Will see how she feels tomorrow. Trend H&H and watch drain output before starting lovenox back.   Curlene Labrum, MD [facility] [address] [place], [place] [id number] [phone] (office)

## 2022-05-05 NOTE — Plan of Care (Signed)
POD #0. No acute events overnight.   Problem: Education: Goal: Knowledge of General Education information will improve Description: Including pain rating scale, medication(s)/side effects and non-pharmacologic comfort measures Outcome: Progressing   Problem: Health Behavior/Discharge Planning: Goal: Ability to manage health-related needs will improve Outcome: Progressing   Problem: Clinical Measurements: Goal: Ability to maintain clinical measurements within normal limits will improve Outcome: Progressing Goal: Will remain free from infection Outcome: Progressing Goal: Diagnostic test results will improve Outcome: Progressing Goal: Respiratory complications will improve Outcome: Progressing Goal: Cardiovascular complication will be avoided Outcome: Progressing   Problem: Activity: Goal: Risk for activity intolerance will decrease Outcome: Progressing   Problem: Nutrition: Goal: Adequate nutrition will be maintained Outcome: Progressing   Problem: Coping: Goal: Level of anxiety will decrease Outcome: Progressing   Problem: Elimination: Goal: Will not experience complications related to bowel motility Outcome: Progressing Goal: Will not experience complications related to urinary retention Outcome: Progressing   Problem: Pain Managment: Goal: General experience of comfort will improve Outcome: Progressing   Problem: Safety: Goal: Ability to remain free from injury will improve Outcome: Progressing   Problem: Skin Integrity: Goal: Risk for impaired skin integrity will decrease Outcome: Progressing

## 2022-05-05 NOTE — Anesthesia Preprocedure Evaluation (Signed)
Anesthesia Evaluation  Patient identified by MRN, date of birth, ID band Patient awake    Reviewed: Allergy & Precautions, NPO status , Patient's Chart, lab work & pertinent test results, reviewed documented beta blocker date and time   History of Anesthesia Complications (+) PONV and history of anesthetic complications  Airway Mallampati: II  TM Distance: >3 FB Neck ROM: Full    Dental  (+) Dental Advisory Given, Missing   Pulmonary shortness of breath and with exertion, Current Smoker and Patient abstained from smoking.,    Pulmonary exam normal breath sounds clear to auscultation       Cardiovascular Exercise Tolerance: Good hypertension (Did not take metoprolol this morning), Pt. on medications and Pt. on home beta blockers + angina + CAD, + Past MI and + Cardiac Stents  Normal cardiovascular exam Rhythm:Regular Rate:Normal  Cath- 2019 Mid RCA lesion is 75% stenosed. ? Prox LAD lesion is 50% stenosed. ? Mid LAD-1 lesion is 100% stenosed. ? A drug-eluting stent was successfully placed using a STENT SYNERGY DES [id number]. ? IVUS showed proximal edge dissection. ? A drug-eluting stent was successfully placed using a STENT SYNERGY DES 3.5X16 to cover teh proximal edge of the above stent. ? Post intervention, there is a 0% residual stenosis. ? Mid Cx lesion is 60-70% stenosed. ? LV end diastolic pressure is moderately elevated. ? There is no aortic valve stenosis. ? Significant tortuosity in the right subclavian made catheter torquing difficult for the RCA.   Significant tortuosity in the right subclavian made catheter torquing difficult for the RCA.    Recommend uninterrupted dual antiplatelet therapy with Aspirin '81mg'$  QD and Ticagrelor '90mg'$  BID for a minimum of 12 months (ACS - Class I recommendation).   Consider PCI of the RCA at a later time.  Elevated LVEDP.  She will likely need to recover from this anterior MI for a few  weeks.  Consider femoral approach for the RCA PCI.      Neuro/Psych negative neurological ROS  negative psych ROS   GI/Hepatic Neg liver ROS, GERD  Medicated and Poorly Controlled,  Endo/Other  negative endocrine ROS  Renal/GU Renal InsufficiencyRenal disease (AKI)  negative genitourinary   Musculoskeletal negative musculoskeletal ROS (+)   Abdominal   Peds negative pediatric ROS (+)  Hematology  (+) Blood dyscrasia, anemia ,   Anesthesia Other Findings   Reproductive/Obstetrics negative OB ROS                           Anesthesia Physical Anesthesia Plan  ASA: 3  Anesthesia Plan: General   Post-op Pain Management: Dilaudid IV   Induction: Intravenous  PONV Risk Score and Plan: 4 or greater and Ondansetron, Dexamethasone, Midazolam and Scopolamine patch - Pre-op  Airway Management Planned: Oral ETT  Additional Equipment:   Intra-op Plan:   Post-operative Plan: Extubation in OR  Informed Consent: I have reviewed the patients History and Physical, chart, labs and discussed the procedure including the risks, benefits and alternatives for the proposed anesthesia with the patient or authorized representative who has indicated his/her understanding and acceptance.     Dental advisory given  Plan Discussed with: CRNA and Surgeon  Anesthesia Plan Comments:         Anesthesia Quick Evaluation

## 2022-05-05 NOTE — Plan of Care (Signed)
  Problem: Acute Rehab PT Goals(only PT should resolve) Goal: Pt Will Go Supine/Side To Sit Outcome: Progressing Flowsheets (Taken 05/05/2022 1543) Pt will go Supine/Side to Sit: with modified independence Goal: Patient Will Transfer Sit To/From Stand Outcome: Progressing Flowsheets (Taken 05/05/2022 1543) Patient will transfer sit to/from stand: with modified independence Goal: Pt Will Transfer Bed To Chair/Chair To Bed Outcome: Progressing Flowsheets (Taken 05/05/2022 1543) Pt will Transfer Bed to Chair/Chair to Bed: with modified independence Goal: Pt Will Ambulate Outcome: Progressing Flowsheets (Taken 05/05/2022 1543) Pt will Ambulate:  100 feet  with modified independence  with least restrictive assistive device   3:43 PM, 05/05/22 Lonell Grandchild, MPT Physical Therapist with [facility] [phone] office [phone] mobile phone

## 2022-05-05 NOTE — Evaluation (Signed)
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  Patient tolerated sitting up at bedside after therapy with family members and RN present in room.  Patient will benefit from continued skilled physical therapy in hospital and recommended venue below to increase strength, balance, endurance for safe ADLs and gait.        Recommendations for follow up therapy are one component of a multi-disciplinary discharge planning process, led by the attending physician.  Recommendations may be updated based on patient status, additional functional criteria and insurance authorization.  Follow Up Recommendations No PT follow up      Assistance Recommended at Discharge Set up Supervision/Assistance  Patient can return home with the following  A little help with walking and/or transfers;A little help with bathing/dressing/bathroom;Help with stairs or ramp for entrance;Assistance with cooking/housework    Equipment Recommendations None recommended by PT  Recommendations for Other Services       Functional Status Assessment Patient has had a recent decline in their functional status and demonstrates the ability to make significant improvements in function in a reasonable and predictable amount of time.     Precautions / Restrictions Precautions Precautions: None Restrictions Weight Bearing Restrictions: No       Mobility  Bed Mobility Overal bed mobility: Needs Assistance Bed Mobility: Supine to Sit     Supine to sit: Supervision     General bed mobility comments: increased time, labored movement    Transfers Overall transfer level: Needs assistance Equipment used: None, 1 person hand held assist Transfers: Sit to/from Stand, Bed to chair/wheelchair/BSC Sit to Stand: Supervision   Step pivot transfers: Supervision       General transfer comment: slightly labored movement, increased time    Ambulation/Gait Ambulation/Gait assistance: Supervision Gait Distance (Feet): 80 Feet Assistive device: None Gait Pattern/deviations: Decreased step length - left, Decreased stance time - right, Decreased stride length Gait velocity: decreased     General Gait Details: slightly slow labored cadence without loss of balance, limited mostly due to fatigue  Stairs            Wheelchair Mobility    Modified Rankin (Stroke Patients Only)       Balance Overall balance assessment: Mild deficits observed, not formally tested                                           Pertinent Vitals/Pain Pain Assessment Pain Assessment: Faces Faces Pain Scale: Hurts little more Pain Location: left chest wall Pain Descriptors / Indicators: Sore, Grimacing Pain Intervention(s): Limited activity within patient's tolerance, Monitored during session, Repositioned    Home Living Family/patient expects to be discharged to:: Private residence Living Arrangements: Spouse/significant other;Children Available Help at Discharge: Family;Available 24 hours/day Type of Home: House Home Access: Stairs to enter Entrance Stairs-Rails: None Entrance Stairs-Number of Steps: 1  Home Layout: One level Home Equipment: Hand held shower head;Shower seat;Grab bars - tub/shower      Prior Function Prior Level of Function : Independent/Modified Independent             Mobility  Comments: Hydrographic surveyor without AD, drives ADLs Comments: Independent     Hand Dominance        Extremity/Trunk Assessment   Upper Extremity Assessment Upper Extremity Assessment: Overall WFL for tasks assessed;LUE deficits/detail LUE Deficits / Details: grossly 4-/5 LUE: Unable to fully assess due to pain LUE Sensation: WNL LUE Coordination: WNL    Lower Extremity Assessment Lower Extremity Assessment: Overall WFL for tasks assessed    Cervical / Trunk Assessment Cervical / Trunk Assessment: Normal  Communication   Communication: No difficulties  Cognition Arousal/Alertness: Awake/alert Behavior During Therapy: WFL for tasks assessed/performed Overall Cognitive Status: Within Functional Limits for tasks assessed                                          General Comments      Exercises Other Exercises Other Exercises: shoulder flexion, abduction, hands behind the head, shoulder shruggs, shoulder blade squeezes   Assessment/Plan    PT Assessment Patient needs continued PT services  PT Problem List Decreased strength;Decreased activity tolerance;Decreased balance;Decreased mobility       PT Treatment Interventions DME instruction;Gait training;Stair training;Functional mobility training;Therapeutic activities;Therapeutic exercise;Patient/family education;Balance training    PT Goals (Current goals can be found in the Care Plan section)  Acute Rehab PT Goals Patient Stated Goal: return home with family to assist PT Goal Formulation: With patient/family Time For Goal Achievement: 05/06/22 Potential to Achieve Goals: Good    Frequency Min 3X/week     Co-evaluation               AM-PAC PT "6 Clicks" Mobility  Outcome Measure Help needed turning from your back to your side while in a flat bed without using bedrails?: None Help needed moving from lying on your back to sitting on the side of a flat bed without using bedrails?:  None Help needed moving to and from a bed to a chair (including a wheelchair)?: A Little Help needed standing up from a chair using your arms (e.g., wheelchair or bedside chair)?: None Help needed to walk in hospital room?: A Little Help needed climbing 3-5 steps with a railing? : A Little 6 Click Score: 21    End of Session   Activity Tolerance: Patient tolerated treatment well;Patient limited by fatigue Patient left: in bed;with call bell/phone within reach Nurse Communication: Mobility status PT Visit Diagnosis: Unsteadiness on feet (R26.81);Other abnormalities of gait and mobility (R26.89);Muscle weakness (generalized) (M62.81)    Time: 2774-1287 PT Time Calculation (min) (ACUTE ONLY): 29 min   Charges:   PT Evaluation $PT Eval Moderate Complexity: 1 Mod PT Treatments $Therapeutic Activity: 23-37 mins        3:42 PM, 05/05/22 Lonell Grandchild, MPT Physical Therapist with [facility] 336 [phone] office [phone] mobile phone

## 2022-05-05 NOTE — Interval H&P Note (Signed)
History and Physical Interval Note:  05/05/2022 7:25 AM  [name]  has presented today for surgery, with the diagnosis of RIGHT BREAST MASS LEFT BREAST CANCER ER +.  The various methods of treatment have been discussed with the patient and family. After consideration of risks, benefits and other options for treatment, the patient has consented to  Procedure(s): MASTECTOMY MODIFIED RADICAL WITH RADIO FREQUENCY LOCALIZER (Left) BREAST BIOPSY WITH RADIO FREQUENCY LOCALIZER (Right) as a surgical intervention.  The patient's history has been reviewed, patient examined, no change in status, stable for surgery.  I have reviewed the patient's chart and labs.  Questions were answered to the patient's satisfaction.    Right breast biopsy after tag, marked. Left breast mastectomy and axillary dissection given extent of nodes positive on preoperative MRI. Discussed with patient risk of bleeding, infection, lymphedema, injury to neurovascular structures.   Traci Fuller

## 2022-05-05 NOTE — Anesthesia Postprocedure Evaluation (Signed)
Anesthesia Post Note  Patient: Traci Fuller  Procedure(s) Performed: MASTECTOMY MODIFIED RADICAL WITH RADIO FREQUENCY LOCALIZER (Left) BREAST BIOPSY WITH RADIO FREQUENCY LOCALIZER (Right)  Patient location during evaluation: Phase II Anesthesia Type: General Level of consciousness: awake and alert and oriented Pain management: pain level controlled Vital Signs Assessment: post-procedure vital signs reviewed and stable Respiratory status: spontaneous breathing, nonlabored ventilation and respiratory function stable Cardiovascular status: blood pressure returned to baseline and stable Postop Assessment: no apparent nausea or vomiting Anesthetic complications: no   No notable events documented.   Last Vitals:  Vitals:   05/05/22 1215 05/05/22 1230  BP: 135/75 128/62  Pulse:  69  Resp: 16 14  Temp:    SpO2: 94% 94%    Last Pain:  Vitals:   05/05/22 1234  TempSrc:   PainSc: 3                  Kameisha Malicki C Khamille Beynon

## 2022-05-05 NOTE — Transfer of Care (Signed)
Immediate Anesthesia Transfer of Care Note  Patient: Traci Fuller  Procedure(s) Performed: MASTECTOMY MODIFIED RADICAL WITH RADIO FREQUENCY LOCALIZER (Left) BREAST BIOPSY WITH RADIO FREQUENCY LOCALIZER (Right)  Patient Location: PACU  Anesthesia Type:General  Level of Consciousness: drowsy and patient cooperative  Airway & Oxygen Therapy: Patient Spontanous Breathing and Patient connected to face mask oxygen  Post-op Assessment: Report given to RN and Post -op Vital signs reviewed and stable  Post vital signs: Reviewed and stable  Last Vitals:  Vitals Value Taken Time  BP 146/73 05/05/22 1134  Temp    Pulse 68 05/05/22 1135  Resp 9 05/05/22 1135  SpO2 100 % 05/05/22 1135  Vitals shown include unvalidated device data.  Last Pain:  Vitals:   05/05/22 0711  TempSrc: Oral  PainSc: 0-No pain      Patients Stated Pain Goal: 6 (09/32/67 1245)  Complications: No notable events documented.

## 2022-05-05 NOTE — Op Note (Addendum)
[facility] Operative Note  05/05/22  Preoperative Diagnosis: Right breast mass, Left breast cancer    Postoperative Diagnosis: Same   Procedure(s) Performed: Right breast excisional biopsy after radiofrequency tag placement; Left breast modified radical mastectomy    Surgeon: Ria Comment C. Constance Haw, MD   Assistants: No qualified resident was available    Anesthesia: General endotracheal   Anesthesiologist: Denese Killings, MD    Specimens: Right breast mass, left breast (radiofrequency tag in lateral aspect with known positive node) and left axillary dissection    Estimated Blood Loss: Minimal   Blood Replacement: None    Complications: None   Wound Class: Clean    Operative Indications: Ms. Hazell is a 66 yo who comes in with a left breast cancer s/p neoadjuvant therapy and a right breast mass noted on MRI and biopsied. We discussed left breast modified radical mastectomy given her nodal involvement on the pre treatment MRI and right breast excision biopsy after radiofrequency tag placement. We also discussed tag placement in the left axilla bedside the positive node that had been tagged pre treatment (this was tagged as the node had been reported to be low on imaging and I wanted to ensure it was removed). We discussed bleeding, infection, risk of further surgery and treatment, risk of neurovascular injury, lymphedema.  I discussed the operative plan with Dr. Delton Coombes including the plan for axillary dissection and he agreed.   Findings: Right breast excision with radiofrequency tag and biopsy clips in place on mammogram; Left breast and radiofrequency tag in the lateral aspect of the breast tissue; normal anatomy of left axilla with long thoracic nerve, thoracodorsal bundle identified     Procedure: The patient was taken to the operating room and placed supine. General endotracheal anesthesia was induced. Intravenous antibiotics were administered per  protocol.  The right breast, left breast and left axilla were prepped and draped in the standard fashion. The preoperative mammogram after tag placement was pulled up and reviewed.   Using the radiofrequency tag localizer, the shortest distance to the radiofrequency tag on the right breast was identified. A circumareolar incision was made and carried down. Flaps were created. Using sharp dissection with scissors and cautery, the breast tissue surrounding the biopsy clips and tag was removed. The specimen was marked short superior and long lateral. The specimen was sent for mammogram and noted to have the tag and biopsy clips.  The cavity was made hemostatic. The flaps were closed with 3-0 Vicryl interrupted leaving the cavity open for seroma formation.  The skin was closed with 4-0 Monocryl and dermabond.  Attention was then turned to the left breast. A firm mass was still noted on the lateral aspect. A skin incision was made encompassing the nipple-areola complex in the transverse direction across the breast and into the axilla. Flaps were raised in the avascular plane between the subcutaneous tissue and the breast tissue from the clavicle superiorly, the sternum medially, the anterior rectus sheath inferiorly, and the anterior border of the latissimus dorsi muscle laterally.  Hemostasis was achieved in the flaps.  Next the breast tissue and underlying pectoralis fascia were excised from the pectoralis major muscle progressing medial to lateral. At the lateral border the pectoralis muscle the breast was swung and the lateral pedicle was identified where it gave way to fat of the axilla and it was incised and the specimen was removed. The radiofrequency tag was noted to be in the lateral aspect of the breast specimen and this is  the low lymph node that had been previously biopsied and clipped and tagged to ensure I removed it.    The specimen was marked short superior.    The clavipectoral fascia was incised  along the edge of the pectoralis major and the major and minor were freed from the surrounding fat and nodal tissue. Dissection progressed first under the major and the under hte minor retracting the muscles medially with a Metallurgist.  The medial pectoral neurovascular bundle was identified and preserved.  The level II nodal tissue deep to the pectoralis minor was included in the dissection. The axillary vein was identified and cleared of overlying fat. The first branch off the vein was clipped with divided. The thoracodorsal nerve was identified deep and was preserved. The long thoracic nerve was identified on the chest wall along the edge of the latissimus dorsi and was preserved. The remaining nodal tissue between the nerves was carefully removed, taking care to protect the nerves. The remaining lateral attachments were incised. The specimen of the axilla was marked with a silk suture on the superior portion Level II nodes.    The wound was irrigated. Hemostasis was achieved. A closed suction drain was placed in the axilla and in the mastectomy site and both were brought out laterally and secured with 3-0 Nylon sutures.  The skin flaps were closed with 2-0 Vicryl interrupted sutures and the skin was closed with staples. Gauze and ABD dressing was placed and a breast binder.    Final inspection revealed acceptable hemostasis. All counts were correct at the end of the case. The patient was awakened from anesthesia and extubated without complication.  The patient went to the PACU in stable condition.   Curlene Labrum, MD [facility] [address] [place], [place] [address] [phone] (office)

## 2022-05-05 NOTE — Anesthesia Procedure Notes (Signed)
Procedure Name: LMA Insertion Date/Time: 05/05/2022 7:41 AM  Performed by: Genelle Bal, CRNAPre-anesthesia Checklist: Patient identified, Emergency Drugs available, Suction available and Patient being monitored Patient Re-evaluated:Patient Re-evaluated prior to induction Oxygen Delivery Method: Circle system utilized Preoxygenation: Pre-oxygenation with 100% oxygen Induction Type: IV induction Ventilation: Mask ventilation without difficulty LMA: LMA inserted LMA Size: 4.0 Number of attempts: 1 Airway Equipment and Method: Bite block Placement Confirmation: positive ETCO2 Tube secured with: Tape Dental Injury: Teeth and Oropharynx as per pre-operative assessment

## 2022-05-05 NOTE — Progress Notes (Signed)
[facility]   Updated her husband. Left breast modified radical mastectomy and right breast excisional biopsy completed.  PRN For pain IS, OOB Home meds Diet PT for mobility and exercises post mastectomy  Holding lovenox for now given the bleeding in the case, she has history of PE and bridged with lovenox from eliquis, took Lovenox yesterday. Will reassess with labs tomorrow and JP drain out put.  Curlene Labrum, MD [facility] [address] [place], [place] [address] [phone] (office)

## 2022-05-06 DIAGNOSIS — C50912 Malignant neoplasm of unspecified site of left female breast: Secondary | ICD-10-CM | POA: Diagnosis not present

## 2022-05-06 LAB — CBC WITH DIFFERENTIAL/PLATELET
Abs Immature Granulocytes: 0.05 10*3/uL (ref 0.00–0.07)
Basophils Absolute: 0 10*3/uL (ref 0.0–0.1)
Basophils Relative: 0 %
Eosinophils Absolute: 0 10*3/uL (ref 0.0–0.5)
Eosinophils Relative: 0 %
HCT: 28.7 % — ABNORMAL LOW (ref 36.0–46.0)
Hemoglobin: 9.2 g/dL — ABNORMAL LOW (ref 12.0–15.0)
Immature Granulocytes: 1 %
Lymphocytes Relative: 12 %
Lymphs Abs: 1.2 10*3/uL (ref 0.7–4.0)
MCH: 33.8 pg (ref 26.0–34.0)
MCHC: 32.1 g/dL (ref 30.0–36.0)
MCV: 105.5 fL — ABNORMAL HIGH (ref 80.0–100.0)
Monocytes Absolute: 0.7 10*3/uL (ref 0.1–1.0)
Monocytes Relative: 7 %
Neutro Abs: 8.1 10*3/uL — ABNORMAL HIGH (ref 1.7–7.7)
Neutrophils Relative %: 80 %
Platelets: 175 10*3/uL (ref 150–400)
RBC: 2.72 MIL/uL — ABNORMAL LOW (ref 3.87–5.11)
RDW: 15.6 % — ABNORMAL HIGH (ref 11.5–15.5)
WBC: 10.1 10*3/uL (ref 4.0–10.5)
nRBC: 0 % (ref 0.0–0.2)

## 2022-05-06 LAB — BASIC METABOLIC PANEL
Anion gap: 6 (ref 5–15)
BUN: 30 mg/dL — ABNORMAL HIGH (ref 8–23)
CO2: 24 mmol/L (ref 22–32)
Calcium: 9.1 mg/dL (ref 8.9–10.3)
Chloride: 107 mmol/L (ref 98–111)
Creatinine, Ser: 1.42 mg/dL — ABNORMAL HIGH (ref 0.44–1.00)
GFR, Estimated: 41 mL/min — ABNORMAL LOW (ref >60)
Glucose, Bld: 100 mg/dL — ABNORMAL HIGH (ref 70–99)
Potassium: 4.1 mmol/L (ref 3.5–5.1)
Sodium: 137 mmol/L (ref 135–145)

## 2022-05-06 MED ORDER — OXYCODONE HCL 5 MG PO TABS: 5.0000 mg | ORAL_TABLET | ORAL | 0 refills | Status: DC | PRN

## 2022-05-06 MED ORDER — ONDANSETRON 4 MG PO TBDP: 4.0000 mg | ORAL_TABLET | Freq: Four times a day (QID) | ORAL | 0 refills | Status: AC | PRN

## 2022-05-06 MED ORDER — ANASTROZOLE 1 MG PO TABS: 1.0000 mg | ORAL_TABLET | Freq: Every day | ORAL | 4 refills | Status: DC

## 2022-05-06 NOTE — Progress Notes (Signed)
Patient has been stable, given pain medication prn.  Education given to patient on JP drain and wound care.

## 2022-05-06 NOTE — Discharge Instructions (Addendum)
Discharge instructions after breast surgery:   Call Dr. Constance Haw [phone]) if your drains are putting out more than 100 Cc (mL) of DARK RED blood daily.  Restart lovenox shots and take those for the next 4 days. If the output is becoming less bloody and you have no other signs of bleeding like bad bruising or swelling, then restart the Eliquis the day after your last shot.  Continue you exercises daily.   Take Tylenol '1000mg'$  @ 6am, 12noon, 6pm, 12mdnight (Do not exceed '4000mg'$  of tylenol a day).  Take Roxicodone for breakthrough pain every 4 hours.   Common Complaints: Pain and bruising at the incision sites.  Swelling at the incision sites. Stiffness of the arm.   Drain Care: Please keep the drain clean and dry. Replace the gauze/ tape around the drain if it gets dirty or wet/ saturated. Please do not mess with or cut the stitch that is keeping the drain in place. Secure the drain to your clothes so that it does not get dislodged.  You may want to wear a binder around your chest at night for sleeping if you are worried about it getting pulled out.  Please record the output from the drain daily including the color and the amount in milliliters.  Strip the tubing daily (can use an alcohol wipe to help strip).  Please keep the drain covered with plastic and tape when you shower so that it does not get wet.  OR bird bath until the drains are out.   Diet/ Activity: Diet as tolerated.  You may shower but do not take hot showers as this can disrupt the glue. Rest and listen to your body, but do not remain in bed all day.  Walk everyday for at least 15-20 minutes. Deep cough and move around every 1-2 hours in the first few days after surgery.  Do not lift > 10 lbs for the first 2 weeks after surgery. Do not do anything that makes you feel like you are putting unnecessary pull or stretch on the incision sites.  Do move your arm and shoulder (see exercises options below). If you do not  move then you can get stiff and hurt more.  Do not pick at the dermabond glue on your incision sites.  This glue film will remain in place for 1-2 weeks and will start to peel off.  Do not pick at the staples.  Do not place lotions or balms on your incision unless instructed to specifically by Dr. BConstance Haw   Pain Expectations and Narcotics: -After surgery you will have pain associated with your incisions and this is normal. The pain is muscular and nerve pain, and will get better with time. -You are encouraged and expected to take non narcotic medications like tylenol and ibuprofen (when able) to treat pain as multiple modalities can aid with pain treatment. -Narcotics are only used when pain is severe or there is breakthrough pain. -You are not expected to have a pain score of 0 after surgery, as we cannot prevent pain. A pain score of 3-4 that allows you to be functional, move, walk, and tolerate some activity is the goal. The pain will continue to improve over the days after surgery and is dependent on your surgery. -Due to [place] law, we are only able to give a certain amount of pain medication to treat post operative pain, and we only give additional narcotics on a patient by patient basis.  -For most laparoscopic surgery, studies have shown  that the majority of patients only need 10-15 narcotic pills, and for open surgeries most patients only need 15-20.   -Having appropriate expectations of pain and knowledge of pain management with non narcotics is important as we do not want anyone to become addicted to narcotic pain medication.  -Using ice packs in the first 48 hours and heating pads after 48 hours, wearing an abdominal binder (when recommended), and using over the counter medications are all ways to help with pain management.   -Simple acts like meditation and mindfulness practices after surgery can also help with pain control and research has proven the benefit of these  practices.  Medication: Take Tylenol '1000mg'$  @ 6am, 12noon, 6pm, 63mdnight (Do not exceed '4000mg'$  of tylenol a day).  Take Roxicodone for breakthrough pain every 4 hours.  Take Colace for constipation related to narcotic pain medication. If you do not have a bowel movement in 2 days, take Miralax over the counter.  Drink plenty of water to also prevent constipation.   Contact Information: If you have questions or concerns, please call our office, [phone] Monday- Thursday 8AM-5PM and Friday 8AM-12Noon.  If it is after hours or on the weekend, please call [facility], [phone] and ask to speak to the surgeon on call for Dr. [facility]   Exercises After Breast Surgery Do at least a few of the exercises below twice a day. It is ok to start the day after surgery and gradually build up the amount and type of exercises you do. Link to the exercises with pictures ([email or web address].   Deep Breathing Exercise Deep breathing can help you relax and ease discomfort and tightness around your incision (surgical cut). It's also a very good way to relieve stress during the day.  Sit comfortably in a chair. Take a slow, deep breath through your nose. Let your chest and belly expand. Breathe out slowly through your mouth. Repeat as many times as needed.  Arm and Shoulder Exercises Doing arm and shoulder exercises will help you get back your full range of motion on your affected side (the side where you had your surgery). With full range of motion, you'll be able to: Move your arm over your head and out to the side Move your arm behind your neck Move your arm to the middle of your back Do each of the exercises below 5 times a day. Keep doing this until you have a full range of motion again and can use your arm as you did before surgery in all your normal activities. This includes activities at work, at home, and  in recreation or sports. If you had limited movement in your arm before surgery, your goal will be to get back as much movement as you had before.  If you get your full range of motion back quickly, keep doing these exercises once a day instead of 5 times a day. This is especially true if you feel any tightness in your chest, shoulder, or under your affected arm. These exercises can help keep scar tissue from forming in your armpit and shoulder. Scar tissue can limit your arm movements later.  If you still have trouble moving your shoulder 4 weeks after your surgery, tell your surgeon. They'll tell you if you need more rehabilitation, such as physical or occupational therapy.  If you had one of the following surgeries, you can do the following set of exercises on the first day after your surgery, as long as your surgeon  tells you it's safe.  Shoulder rolls The shoulder roll is a good exercise to start with because it gently stretches your chest and shoulder muscles.  Stand or sit comfortably with your arms relaxed at your sides. Start with backward shoulder rolls. In a circular motion, bring your shoulders forward, up, backward, and down. Do this 10 times. Switch directions and do 10 forward shoulder rolls. Bring your shoulders backward, up, forward, and down. Do this 10 times. Try to make the circles as big as you can and move both shoulders at the same time. If you have some tightness across your incision or chest, start with smaller circles and make them bigger as the tightness decreases. The backward direction might feel a little tighter across your chest than the forward direction. This will get better with practice.  Shoulder wings The shoulder wings exercise will help you get back outward movement of your shoulder. You can do this exercise while sitting or standing.  Place your hands on your chest or collarbone. Raise your elbows out to the side, limiting your range of motion as instructed  by your healthcare team. Slowly lower your elbows. Do this 10 times. Then, slowly lower your hands. If you feel discomfort while doing this exercise, hold your position and do the deep breathing exercise. If the discomfort passes, raise your elbows a little higher. If it doesn't pass, don't raise your elbows any higher. Finish the exercise raising your elbows only high enough to feel a gentle stretch and no discomfort.  Arm circles If you had surgery on both breasts, do this exercise with both arms, 1 arm at a time. Don't do this exercise with both arms at the same time. This will put too much pressure on your chest.  Stand with your feet slightly apart for balance. Raise your affected arm out to the side as high as you can, limiting your range of movement as instructed by your healthcare team. Start making slow, backward circles in the air with your arm. Make sure you're moving your arm from your shoulder, not your elbow. Keep your elbow straight. Increase the size of the circles until they're as big as you can comfortably make them, limiting your range of motion as instructed by your healthcare team. If you feel any aching or if your arm is tired, take a break. Keep doing the exercise when you feel better. Do 10 full backward circles. Then, slowly lower your arm to your side. Rest your arm for a moment. Follow steps 1 to 4 again, but this time make slow, forward circles.  W exercise You can do the W exercise while sitting or standing.  Form a "W" with your arms out to the side and palms facing forward (see Figure 4). Try to bring your hands up so they're even with your face. If you can't raise your arms that high, bring them to the highest comfortable position. Make sure to limit your range of motion as instructed by your healthcare team. Pinch your shoulder blades together and downward, as if you're squeezing a pencil between them. If you feel discomfort, stop at that position and do the deep  breathing exercise. If the discomfort passes, try to bring your arms back a little further. If it doesn't pass, don't reach any further. Hold the furthest position that doesn't cause discomfort. Squeeze your shoulder blades together and downward for 5 seconds. Slowly bring your arms back down to the starting position. Repeat this movement 10 times.  Back Climb You can do the back climb stretch while sitting or standing. You'll need a timer or stopwatch.  Place your hands behind your back. Hold the hand on your affected side with your other hand. If you had surgery on both breasts, use the arm that moves most easily to hold the other. Slowly slide your hands up the center of your back as far as you can. If you feel tightness near your incision, stop at that position and do the deep breathing exercise. If the tightness decreases, try to slide your hands up a little further. If it doesn't decrease, don't slide your hands up any further. Hold the highest position you can for 1 minute. Use your stopwatch or timer to keep track. You should feel a gentle stretch in your shoulder area. After 1 minute, slowly lower your hands.  Hands behind neck You can do the hands behind neck stretch while sitting or standing. You'll need a timer or stopwatch.  Clasp your hands together on your lap or in front of you. Slowly raise your hands toward your head, keeping your elbows together in front of you, not out to the sides. Keep your head level. Don't bend your neck or head forward. Slide your hands over your head until you reach the back of your neck. When you get to this point, spread your elbows out to the sides. Hold this position for 1 minute. Use your stopwatch or timer to keep track. Breathe normally. Don't hold your breath as you stretch your body. If you have some tightness across your incision or chest, hold your position and do the deep breathing exercise. If the tightness decreases, continue with the  movement. If the tightness stays the same, reach up and stretch your elbows back as best as you can without causing discomfort. Hold the position you're most comfortable in for 1 minute. Slowly come out of the stretch by bringing your elbows together and sliding your hands over your head. Then, slowly lower your arms.  Forward wall crawls You'll need 2 pieces of tape for the forward wall crawl exercise.  Stand facing a wall. Your toes should be about 6 inches (15 centimeters) from the wall. Reach as high as you can with your unaffected arm. [place] that point with a piece of tape. This will be the goal for your affected arm. If you had surgery on both breasts, set your goal using the arm that moves most comfortably. Place both hands against the wall at a level that's comfortable. Crawl your fingers up the wall as far as you can, keeping them even with each other.. Try not to look up toward your hands or arch your back. When you get to the point where you feel a good stretch, but not pain, do the deep breathing exercise. Return to the starting position by crawling your fingers back down the wall. Repeat the wall crawl 10 times. Each time you raise your hands, try to crawl a little bit higher. On the 10th crawl, use the other piece of tape to mark the highest point you reached with your affected arm. This will let you to see your progress each time you do this exercise. As you become more flexible, you may need to take a step closer to the wall so you can reach a little higher.   Side wall crawls You'll also need 2 pieces of tape for the side wall crawl exercise.  You shouldn't feel pain while doing this exercise. It's  normal to feel some tightness or pulling across the side of your chest. Focus on your breathing until the tightness decreases. Breathe normally throughout this exercise. Don't hold your breath.  Be careful not to turn your body toward the wall while doing this exercise. Make sure only  the side of your body faces the wall.  If you had surgery on both breasts, start with step 3.  Stand with your unaffected side closest to the wall, about 1 foot (30.5 centimeters) away from the wall. Reach as high as you can with your unaffected arm. [place]ta [place] that point with a piece of tape (see Figure 8). This will be the goal for your affected arm. Turn your body so your affected side is now closest to the wall. If you had surgery on both breasts, start with either side closest to the wall. Crawl your fingers up the wall as far as you can. When you get to the point where you feel a good stretch, but not pain, do the deep breathing exercise. Return to the starting position by crawling your fingers back down the wall. Repeat this exercise 10 times. On your 10th crawl, use a piece of tape to mark the highest point you reached with your affected arm. This will let you see your progress each time you do the exercise. If you had surgery on both breasts, repeat the exercise with your other arm.  Swelling After your surgery, you may have some swelling or puffiness in your hand or arm on your affected side. This is normal and usually goes away on its own.  If you notice swelling in your hand or arm, follow the tips below to help the swelling go away.  Raise your arm above your head and do hand pumps several times a day. To do hand pumps, slowly open and close your fist 10 times. This will help drain the fluid out of your arm. Don't hold your arm straight up over your head for more than a few minutes. This can cause your arm muscles to get tired. Raise your arm to the side a few times a day for about 20 minutes at a time. To do this, sit or lie down on your back. Rest your arm on a few pillows next to you so it's raised above the level of your heart. If you're able to sleep on your unaffected side, you can place 1 or 2 pillows in front of you and rest your affected arm on them while you sleep. If the  swelling doesn't go down within 4 to 6 weeks, call your surgeon or nurse.

## 2022-05-06 NOTE — Discharge Summary (Signed)
Physician Discharge Summary  Patient ID: Traci Fuller MRN: [id number] DOB/AGE: Mar 19, 1956 66 y.o.  Admit date: 05/05/2022 Discharge date: 05/06/2022  Admission Diagnoses: Breast cancer   Discharge Diagnoses:  Principal Problem:   Breast cancer, left [facility]) Active Problems:   Breast cancer metastasized to axillary lymph node, left (HCC)   Breast mass, right   Breast cancer [facility])   Discharged Condition: good  Hospital Course: [name] is a sweett 66 yo who has left breast cancer and needs mastectomy and axillary dissection. She received preoperative neoajduvant therapy. She was brought in and underwent surgery. She stayed overnight for pain control and for Physical Therapy evaluation. She was doing well this Am and feeling good. Her pain is controlled with tylenol at this time. She was instructed on her drain care.   Consults:  Physical therapy   Significant Diagnostic Studies:   Latest Reference Range & Units 05/06/22 05:41  WBC 4.0 - 10.5 K/uL 10.1  RBC 3.87 - 5.11 MIL/uL 2.72 (L)  Hemoglobin 12.0 - 15.0 g/dL 9.2 (L)  HCT 36.0 - 46.0 % 28.7 (L)  MCV 80.0 - 100.0 fL 105.5 (H)  MCH 26.0 - 34.0 pg 33.8  MCHC 30.0 - 36.0 g/dL 32.1  RDW 11.5 - 15.5 % 15.6 (H)  Platelets 150 - 400 K/uL 175  nRBC 0.0 - 0.2 % 0.0  Neutrophils % 80  Lymphocytes % 12  Monocytes Relative % 7  Eosinophil % 0  Basophil % 0  Immature Granulocytes % 1    Latest Reference Range & Units 05/06/22 05:41  Sodium 135 - 145 mmol/L 137  Potassium 3.5 - 5.1 mmol/L 4.1  Chloride 98 - 111 mmol/L 107  CO2 22 - 32 mmol/L 24  Glucose 70 - 99 mg/dL 100 (H)  BUN 8 - 23 mg/dL 30 (H)  Creatinine 0.44 - 1.00 mg/dL 1.42 (H)  Calcium 8.9 - 10.3 mg/dL 9.1  Anion gap 5 - 15  6   Treatments: Left breast modified radical mastectomy, right breast excisional biopsy   Discharge Exam: Blood pressure 115/65, pulse 68, temperature 98 F (36.7 C), resp. rate 16, height 5' (1.524 m), weight 66.7 kg, SpO2 94  %. General appearance: alert and no distress Resp: normal work of breathing Breasts: right breast excision site c/d/I with dermabond, left breast mastectomy site with staples, no drainage or erythema, JP with dark SS output  Breast JP drain 50 cc; Axilla JP drain 120 Cc   Call if your drains are putting out more than 100 Cc (mL) of DARK RED blood daily.  Restart lovenox shots and take those for the next 4 days. If the output is becoming less bloody and you have no other signs of bleeding like bad bruising or swelling, then restart the Eliquis the day after your last shot.  Continue your exercises daily.   Disposition: Discharge disposition: 01-Home or Self Care       Discharge Instructions     Call MD for:   Complete by: As directed    JP Drain output that is dark bloody and over 100 cc (mL) in a day.   Call MD for:  difficulty breathing, headache or visual disturbances   Complete by: As directed    Call MD for:  extreme fatigue   Complete by: As directed    Call MD for:  persistant dizziness or light-headedness   Complete by: As directed    Call MD for:  persistant nausea and vomiting   Complete by: As  directed    Call MD for:  redness, tenderness, or signs of infection (pain, swelling, redness, odor or green/yellow discharge around incision site)   Complete by: As directed    Call MD for:  severe uncontrolled pain   Complete by: As directed    Call MD for:  temperature >100.4   Complete by: As directed    Increase activity slowly   Complete by: As directed       Allergies as of 05/06/2022   No Known Allergies      Medication List     TAKE these medications    ALPRAZolam 0.25 MG tablet Commonly known as: XANAX Take 1 tablet (0.25 mg total) by mouth 2 (two) times daily as needed for anxiety. What changed: when to take this   amLODipine 10 MG tablet Commonly known as: NORVASC Take 10 mg by mouth daily.   anastrozole 1 MG tablet Commonly known as:  ARIMIDEX Take 1 tablet (1 mg total) by mouth daily. Start taking on: May 10, 2022 What changed: These instructions start on May 10, 2022. If you are unsure what to do until then, ask your doctor or other care provider.   apixaban 5 MG Tabs tablet Commonly known as: ELIQUIS Take 1 tablet (5 mg total) by mouth 2 (two) times daily.   aspirin EC 81 MG tablet Take 1 tablet (81 mg total) by mouth daily.   CALCIUM 600/VITAMIN D3 PO Take 1 tablet by mouth in the morning and at bedtime.   CARBOPLATIN IV Inject into the vein every 21 ( twenty-one) days.   enoxaparin 60 MG/0.6ML injection Commonly known as: Lovenox Inject 0.6 mLs (60 mg total) into the skin daily for 6 days. Hold Eliquis for 2 days prior to surgery. Take the lovenox each morning you do not take your Eliquis. No Eliquis or lovenox the day of surgery   gabapentin 300 MG capsule Commonly known as: NEURONTIN Take 300 mg by mouth 2 (two) times daily.   Kanjinti 150 MG Solr injection Generic drug: trastuzumab-anns Inject 462 mg into the vein every 21 ( twenty-one) days.   metoprolol tartrate 25 MG tablet Commonly known as: LOPRESSOR Take 1 tablet (25 mg total) by mouth 2 (two) times daily. Please call and schedule overdue appointment with Dr. Gwenlyn Found for further refills . SECOND ATTEMPT!   omeprazole 20 MG tablet Commonly known as: PRILOSEC OTC Take 20 mg by mouth daily.   ondansetron 4 MG disintegrating tablet Commonly known as: ZOFRAN-ODT Take 1 tablet (4 mg total) by mouth every 6 (six) hours as needed for nausea.   oxyCODONE 5 MG immediate release tablet Commonly known as: Oxy IR/ROXICODONE Take 1-2 tablets (5-10 mg total) by mouth every 4 (four) hours as needed for severe pain or breakthrough pain.   PERJETA IV Inject into the vein every 21 ( twenty-one) days.   Repatha SureClick 599 MG/ML Soaj Generic drug: Evolocumab Inject 140 mg into the skin every 14 (fourteen) days.   TAXOTERE IV Inject into  the vein every 21 ( twenty-one) days.   Ziextenzo 6 MG/0.6ML injection Generic drug: pegfilgrastim-bmez Inject 6 mg into the skin every 21 ( twenty-one) days. AFTER chemotherapy.        Follow-up Information     Virl Cagey, MD Follow up on 05/12/2022.   Specialty: General Surgery Why: drain check and staple removal Contact information: [id number] [place] [facility] [address] [phone]  Signed: Virl Cagey 05/06/2022, 8:45 AM

## 2022-05-06 NOTE — Progress Notes (Signed)
Patient given discharge instructions, patient performed care of JP drain witnessed by myself. Patient has also expressed verbal understanding of discharge instructions accompanied by daughter. All patient belongings returned and patient is leaving in stable condition.

## 2022-05-10 NOTE — Progress Notes (Signed)
Can you let her know. The right breast mass was not cancer. The left breast had residual cancer and we removed all of it. Her margins were negative. She had 9/18 nodes positive for cancer.

## 2022-05-11 ENCOUNTER — Encounter (HOSPITAL_COMMUNITY): Payer: Self-pay | Admitting: General Surgery

## 2022-05-11 LAB — SURGICAL PATHOLOGY

## 2022-05-12 ENCOUNTER — Ambulatory Visit (INDEPENDENT_AMBULATORY_CARE_PROVIDER_SITE_OTHER): Payer: Managed Care, Other (non HMO) | Admitting: General Surgery

## 2022-05-12 ENCOUNTER — Encounter: Payer: Self-pay | Admitting: General Surgery

## 2022-05-12 VITALS — BP 130/81 | HR 66 | Temp 98.2°F | Resp 14 | Ht 60.0 in | Wt 146.0 lb

## 2022-05-12 DIAGNOSIS — C50912 Malignant neoplasm of unspecified site of left female breast: Secondary | ICD-10-CM

## 2022-05-12 DIAGNOSIS — C773 Secondary and unspecified malignant neoplasm of axilla and upper limb lymph nodes: Secondary | ICD-10-CM

## 2022-05-12 NOTE — Patient Instructions (Signed)
Remove the bandage in 2  days. Reinforce if needed between now and then if bleeding.  Once bandage is off. It is Ok to shower. Pat area dry. Continue your exercises. Will remove remaining staples next week.

## 2022-05-12 NOTE — Progress Notes (Signed)
[facility]  Doing well. JP drains light SS and breast < 10-15 for last 2-3 days, axilla drain 20cc for last 2 days. Has not taken pain meds in 3 days. Feeling ok.  BP 130/81   Pulse 66   Temp 98.2 F (36.8 C) (Oral)   Resp 14   Ht 5' (1.524 m)   Wt 146 lb (66.2 kg)   SpO2 98%   BMI 28.51 kg/m  Incisions healing, staples 1/2 removed, Jps removed and bandage placed No erythema or drainage Right breast incision bruised but glue c/d/I no erythema or drainage    Patient s/p left modified radical mastectomy and right breast excision.   Remove the bandage in 2  days. Reinforce if needed between now and then if bleeding.  Once bandage is off. It is Ok to shower. Pat area dry. Continue your exercises. Will remove remaining staples next week.  Future Appointments  Date Time Provider [place]  05/18/2022 11:30 AM Virl Cagey, MD [facility] None  06/03/2022  9:30 AM AP-ACAPA NURSE [facility] None  06/03/2022 10:30 AM Derek Jack, MD [facility] None  06/03/2022 11:00 AM AP-ACAPA CHAIR 1 [facility] None  06/24/2022  9:20 AM AP-ACAPA NURSE [facility] None  06/24/2022 10:30 AM Derek Jack, MD [facility] None  06/24/2022 11:00 AM AP-ACAPA CHAIR 1 [facility] None  06/30/2022  9:00 AM AP-CT 1 AP-CT [place] [facility]  07/22/2022  9:20 AM AP-ACAPA NURSE [facility] None  07/22/2022 10:30 AM Derek Jack, MD [facility] None  07/22/2022 11:00 AM AP-ACAPA CHAIR 1 [facility] None   Curlene Labrum, MD [facility] [address] [place], [place] [address] [phone] (office)

## 2022-05-13 ENCOUNTER — Inpatient Hospital Stay: Payer: Managed Care, Other (non HMO)

## 2022-05-18 ENCOUNTER — Ambulatory Visit (INDEPENDENT_AMBULATORY_CARE_PROVIDER_SITE_OTHER): Payer: Managed Care, Other (non HMO) | Admitting: General Surgery

## 2022-05-18 ENCOUNTER — Encounter: Payer: Self-pay | Admitting: General Surgery

## 2022-05-18 VITALS — BP 117/79 | HR 70 | Temp 98.2°F | Resp 12 | Ht 60.0 in | Wt 145.0 lb

## 2022-05-18 DIAGNOSIS — C50912 Malignant neoplasm of unspecified site of left female breast: Secondary | ICD-10-CM

## 2022-05-18 DIAGNOSIS — C773 Secondary and unspecified malignant neoplasm of axilla and upper limb lymph nodes: Secondary | ICD-10-CM

## 2022-05-18 NOTE — Progress Notes (Unsigned)
[facility]  Future Appointments  Date Time Provider [place]  06/03/2022  9:30 AM AP-ACAPA NURSE [facility] None  06/03/2022 10:30 AM Derek Jack, MD [facility] None  06/03/2022 11:00 AM AP-ACAPA CHAIR 1 [facility] None  06/17/2022  1:45 PM Virl Cagey, MD RS-RS None  06/24/2022  9:20 AM AP-ACAPA NURSE [facility] None  06/24/2022 10:30 AM Derek Jack, MD [facility] None  06/24/2022 11:00 AM AP-ACAPA CHAIR 1 [facility] None  06/30/2022  9:00 AM AP-CT 1 AP-CT [place] [facility]  07/22/2022  9:20 AM AP-ACAPA NURSE [facility] None  07/22/2022 10:30 AM Derek Jack, MD [facility] None  07/22/2022 11:00 AM AP-ACAPA CHAIR 1 [facility] None

## 2022-05-18 NOTE — Patient Instructions (Signed)
Steristrips will peel off in the next 5-7 days. You can remove them once they are peeling off. It is ok to shower. Pat the area dry.   Some swelling is normal at the incision. Continue your exercises.

## 2022-06-03 ENCOUNTER — Inpatient Hospital Stay: Payer: Managed Care, Other (non HMO) | Attending: Oncology | Admitting: Hematology

## 2022-06-03 ENCOUNTER — Inpatient Hospital Stay: Payer: Managed Care, Other (non HMO)

## 2022-06-03 VITALS — BP 115/61 | HR 65 | Temp 97.1°F | Resp 18

## 2022-06-03 DIAGNOSIS — Z9012 Acquired absence of left breast and nipple: Secondary | ICD-10-CM | POA: Diagnosis not present

## 2022-06-03 DIAGNOSIS — Z79899 Other long term (current) drug therapy: Secondary | ICD-10-CM | POA: Diagnosis not present

## 2022-06-03 DIAGNOSIS — C50812 Malignant neoplasm of overlapping sites of left female breast: Secondary | ICD-10-CM | POA: Insufficient documentation

## 2022-06-03 DIAGNOSIS — Z95828 Presence of other vascular implants and grafts: Secondary | ICD-10-CM

## 2022-06-03 DIAGNOSIS — Z5111 Encounter for antineoplastic chemotherapy: Secondary | ICD-10-CM | POA: Diagnosis present

## 2022-06-03 DIAGNOSIS — Z79811 Long term (current) use of aromatase inhibitors: Secondary | ICD-10-CM | POA: Insufficient documentation

## 2022-06-03 DIAGNOSIS — Z170 Estrogen receptor positive status [ER+]: Secondary | ICD-10-CM | POA: Insufficient documentation

## 2022-06-03 LAB — CBC WITH DIFFERENTIAL/PLATELET
Abs Immature Granulocytes: 0.03 10*3/uL (ref 0.00–0.07)
Basophils Absolute: 0 10*3/uL (ref 0.0–0.1)
Basophils Relative: 1 %
Eosinophils Absolute: 0.3 10*3/uL (ref 0.0–0.5)
Eosinophils Relative: 5 %
HCT: 33.8 % — ABNORMAL LOW (ref 36.0–46.0)
Hemoglobin: 10.9 g/dL — ABNORMAL LOW (ref 12.0–15.0)
Immature Granulocytes: 1 %
Lymphocytes Relative: 20 %
Lymphs Abs: 1.3 10*3/uL (ref 0.7–4.0)
MCH: 32.4 pg (ref 26.0–34.0)
MCHC: 32.2 g/dL (ref 30.0–36.0)
MCV: 100.6 fL — ABNORMAL HIGH (ref 80.0–100.0)
Monocytes Absolute: 0.5 10*3/uL (ref 0.1–1.0)
Monocytes Relative: 8 %
Neutro Abs: 4.2 10*3/uL (ref 1.7–7.7)
Neutrophils Relative %: 65 %
Platelets: 186 10*3/uL (ref 150–400)
RBC: 3.36 MIL/uL — ABNORMAL LOW (ref 3.87–5.11)
RDW: 14.7 % (ref 11.5–15.5)
WBC: 6.5 10*3/uL (ref 4.0–10.5)
nRBC: 0 % (ref 0.0–0.2)

## 2022-06-03 LAB — COMPREHENSIVE METABOLIC PANEL
ALT: 14 U/L (ref 0–44)
AST: 14 U/L — ABNORMAL LOW (ref 15–41)
Albumin: 3.5 g/dL (ref 3.5–5.0)
Alkaline Phosphatase: 140 U/L — ABNORMAL HIGH (ref 38–126)
Anion gap: 6 (ref 5–15)
BUN: 33 mg/dL — ABNORMAL HIGH (ref 8–23)
CO2: 24 mmol/L (ref 22–32)
Calcium: 9.6 mg/dL (ref 8.9–10.3)
Chloride: 110 mmol/L (ref 98–111)
Creatinine, Ser: 1.1 mg/dL — ABNORMAL HIGH (ref 0.44–1.00)
GFR, Estimated: 56 mL/min — ABNORMAL LOW (ref >60)
Glucose, Bld: 96 mg/dL (ref 70–99)
Potassium: 4.2 mmol/L (ref 3.5–5.1)
Sodium: 140 mmol/L (ref 135–145)
Total Bilirubin: 0.5 mg/dL (ref 0.3–1.2)
Total Protein: 6.6 g/dL (ref 6.5–8.1)

## 2022-06-03 LAB — MAGNESIUM: Magnesium: 1.4 mg/dL — ABNORMAL LOW (ref 1.7–2.4)

## 2022-06-03 MED ORDER — SODIUM CHLORIDE 0.9 % IV SOLN
420.0000 mg | Freq: Once | INTRAVENOUS | Status: AC
  Administered 2022-06-03: 420 mg via INTRAVENOUS
  Filled 2022-06-03: qty 14

## 2022-06-03 MED ORDER — MAGNESIUM SULFATE 2 GM/50ML IV SOLN
2.0000 g | Freq: Once | INTRAVENOUS | Status: DC
  Filled 2022-06-03: qty 50

## 2022-06-03 MED ORDER — SODIUM CHLORIDE 0.9% FLUSH
10.0000 mL | INTRAVENOUS | Status: DC | PRN
  Administered 2022-06-03: 10 mL

## 2022-06-03 MED ORDER — TRASTUZUMAB-ANNS CHEMO 150 MG IV SOLR
6.0000 mg/kg | Freq: Once | INTRAVENOUS | Status: AC
  Administered 2022-06-03: 399 mg via INTRAVENOUS
  Filled 2022-06-03: qty 19

## 2022-06-03 MED ORDER — MAGNESIUM SULFATE 2 GM/50ML IV SOLN
2.0000 g | Freq: Once | INTRAVENOUS | Status: AC
  Administered 2022-06-03: 2 g via INTRAVENOUS

## 2022-06-03 MED ORDER — MAGNESIUM SULFATE 2 GM/50ML IV SOLN
2.0000 g | Freq: Once | INTRAVENOUS | Status: AC
  Administered 2022-06-03: 2 g via INTRAVENOUS
  Filled 2022-06-03: qty 50

## 2022-06-03 MED ORDER — ACETAMINOPHEN 325 MG PO TABS
650.0000 mg | ORAL_TABLET | Freq: Once | ORAL | Status: AC
  Administered 2022-06-03: 650 mg via ORAL
  Filled 2022-06-03: qty 2

## 2022-06-03 MED ORDER — SODIUM CHLORIDE 0.9 % IV SOLN: Freq: Once | INTRAVENOUS | Status: AC

## 2022-06-03 MED ORDER — DIPHENHYDRAMINE HCL 25 MG PO CAPS
25.0000 mg | ORAL_CAPSULE | Freq: Once | ORAL | Status: AC
  Administered 2022-06-03: 25 mg via ORAL
  Filled 2022-06-03: qty 1

## 2022-06-03 MED ORDER — HEPARIN SOD (PORK) LOCK FLUSH 100 UNIT/ML IV SOLN
500.0000 [IU] | Freq: Once | INTRAVENOUS | Status: AC | PRN
  Administered 2022-06-03: 500 [IU]

## 2022-06-03 NOTE — Patient Instructions (Addendum)
[place] at [facility] Discharge Instructions   You were seen and examined today by Dr. Delton Coombes.  He reviewed the results of your lab work which are normal/stable. Your magnesium remains low at 1.4. We will give you IV magnesium today in the clinic.   We will proceed with your treatment today.   Return as scheduled.    Thank you for choosing [place] at [facility] to provide your oncology and hematology care.  To afford each patient quality time with our provider, please arrive at least 15 minutes before your scheduled appointment time.   If you have a lab appointment with the [facility] please come in thru the Main Entrance and check in at the main information desk.  You need to re-schedule your appointment should you arrive 10 or more minutes late.  We strive to give you quality time with our providers, and arriving late affects you and other patients whose appointments are after yours.  Also, if you no show three or more times for appointments you may be dismissed from the clinic at the providers discretion.     Again, thank you for choosing [facility].  Our hope is that these requests will decrease the amount of time that you wait before being seen by our physicians.       _____________________________________________________________  Should you have questions after your visit to [facility], please contact our office at [phone] and follow the prompts.  Our office hours are 8:00 a.m. and 4:30 p.m. Monday - Friday.  Please note that voicemails left after 4:00 p.m. may not be returned until the following business day.  We are closed weekends and major holidays.  You do have access to a nurse 24-7, just call the main number to the clinic [phone] and do not press any options, hold on the line and a nurse will answer the phone.    For prescription refill requests, have your pharmacy contact our  office and allow 72 hours.    Due to Covid, you will need to wear a mask upon entering the hospital. If you do not have a mask, a mask will be given to you at the Main Entrance upon arrival. For doctor visits, patients may have 1 support person age 60 or older with them. For treatment visits, patients can not have anyone with them due to social distancing guidelines and our immunocompromised population.

## 2022-06-03 NOTE — Progress Notes (Signed)
Presents today for [place] per provider's order. Vital signs and other labs WNL for treatment. Pt's magnesium is 1.4 today. Pt will receive 4g IV magnesium sulfate per Dr.K. Faythe Ghee to proceed with treatment today per Dr.K.  Calla Kicks, [place], and 4g IV magnesium sulfate given today per MD orders. Tolerated infusion without adverse affects. Vital signs stable. No complaints at this time. Discharged from clinic ambulatory in stable condition. Alert and oriented x 3. F/U with [facility] as scheduled.

## 2022-06-03 NOTE — Progress Notes (Signed)
[facility] [address] [place] [address]   CLINIC:  Medical Oncology/Hematology  PCP:  Traci Squibb, MD [address] Traci Fuller [place] [address] [phone]   REASON FOR VISIT:  Follow-up for locally advanced left breast cancer  PRIOR THERAPY: TCHP x 6 cycles.  NGS Results: not done  CURRENT THERAPY: Anastrozole daily, trastuzumab and Herceptin every 21 days. BRIEF ONCOLOGIC HISTORY:  Oncology History  Breast cancer, left ([place])  10/15/2021 Initial Diagnosis   Breast cancer, left ([place])   11/04/2021 - 04/22/2022 Chemotherapy   Patient is on Treatment Plan : BREAST  Docetaxel + Carboplatin + Trastuzumab + Pertuzumab  (TCHP) q21d       Genetic Testing   Negative genetic testing. No pathogenic variants identified on the [facility] Multi-Cancer+RNA panel. The report date is 11/02/2021.   The Multi-Cancer Panel + RNA offered by [facility] includes sequencing and/or deletion duplication testing of the following 84 genes: AIP, ALK, APC, ATM, AXIN2,BAP1,  BARD1, BLM, BMPR1A, BRCA1, BRCA2, BRIP1, CASR, CDC73, CDH1, CDK4, CDKN1B, CDKN1C, CDKN2A (p14ARF), CDKN2A (p16INK4a), CEBPA, CHEK2, CTNNA1, DICER1, DIS3L2, EGFR (c.2369C>T, p.Thr790Met variant only), EPCAM (Deletion/duplication testing only), FH, FLCN, GATA2, GPC3, GREM1 (Promoter region deletion/duplication testing only), HOXB13 (c.251G>A, p.Gly84Glu), HRAS, KIT, MAX, MEN1, MET, MITF (c.952G>A, p.Glu318Lys variant only), MLH1, MSH2, MSH3, MSH6, MUTYH, NBN, NF1, NF2, NTHL1, PALB2, PDGFRA, PHOX2B, PMS2, POLD1, POLE, POT1, PRKAR1A, PTCH1, PTEN, RAD50, RAD51C, RAD51D, RB1, RECQL4, RET, RUNX1, SDHAF2, SDHA (sequence changes only), SDHB, SDHC, SDHD, SMAD4, SMARCA4, SMARCB1, SMARCE1, STK11, SUFU, TERC, TERT, TMEM127, TP53, TSC1, TSC2, VHL, WRN and WT1.   11/04/2021 -  Chemotherapy   Patient is on Treatment Plan : BREAST  Docetaxel + Carboplatin + Trastuzumab + Pertuzumab  (TCHP) q21d / Trastuzumab + Pertuzumab q21d        CANCER STAGING:  Cancer Staging  Breast cancer, left ([place]) Staging form: Breast, AJCC 8th Edition - Clinical stage from 10/15/2021: Stage IIA (cT3, cN1, cM0, G2, ER+, PR+, HER2+) - Unsigned   INTERVAL HISTORY:  [name], a 66 y.o. female, returns for follow-up of HER2 positive left breast cancer.  She underwent left mastectomy and lymph node biopsy and has recovered very well from surgery.  She is reporting back pain for the past 1 month at T12 level, comes on when she bends forward.  It lasts only few minutes and goes away by itself.  She is not requiring any pain medication.  REVIEW OF SYSTEMS:  Review of Systems  Musculoskeletal:  Positive for back pain.  Neurological:  Negative for numbness.  All other systems reviewed and are negative.   PAST MEDICAL/SURGICAL HISTORY:  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 DES to midLAD, 75% stenosis of RCA not treated   Cancer [facility])    Current smoker    Essential hypertension    Family history of bladder cancer    Family history of breast cancer    Family history of leukemia    Family history of uterine cancer    Hyperlipidemia LDL goal <70    Myocardial infarction ([place])    PONV (postoperative nausea and vomiting)    Port-A-Cath in place 10/29/2021   Past Surgical History:  Procedure Laterality Date   ABDOMINAL HYSTERECTOMY     BREAST BIOPSY Bilateral 10/16/2021   BREAST BIOPSY WITH RADIO FREQUENCY LOCALIZER Right 05/05/2022   Procedure: BREAST BIOPSY WITH RADIO FREQUENCY LOCALIZER;  Surgeon: Traci Fuller,  Traci Matar, MD;  Location: [facility];  Service: General;  Laterality: Right;   CORONARY STENT INTERVENTION N/A 01/10/2018   Procedure: CORONARY STENT INTERVENTION;  Surgeon: Traci Booze, MD;  Location: [place] CV LAB;  Service: Cardiovascular;  Laterality: N/A;   LEFT HEART CATH AND CORONARY ANGIOGRAPHY N/A 01/10/2018   Procedure: LEFT HEART CATH AND  CORONARY ANGIOGRAPHY;  Surgeon: Traci Booze, MD;  Location: [place] CV LAB;  Service: Cardiovascular;  Laterality: N/A;   MASTECTOMY MODIFIED RADICAL Left 05/05/2022   Procedure: MASTECTOMY MODIFIED RADICAL WITH RADIO FREQUENCY LOCALIZER;  Surgeon: Traci Cagey, MD;  Location: [facility];  Service: General;  Laterality: Left;   PORTACATH PLACEMENT Right 10/26/2021   Procedure: INSERTION PORT-A-CATH;  Surgeon: Traci Cagey, MD;  Location: [facility];  Service: General;  Laterality: Right;    SOCIAL HISTORY:  Social History   Socioeconomic History   Marital status: Married    Spouse name: Not on file   Number of children: Not on file   Years of education: Not on file   Highest education level: Not on file  Occupational History   Not on file  Tobacco Use   Smoking status: Every Day    Packs/day: 1.00    Years: 43.00    Total pack years: 43.00    Types: Cigarettes    Start date: 1976   Smokeless tobacco: Never  Vaping Use   Vaping Use: Never used  Substance and Sexual Activity   Alcohol use: Not Currently   Drug use: Never   Sexual activity: Yes  Other Topics Concern   Not on file  Social History Narrative   Not on file   Social Determinants of Health   Financial Resource Strain: Not on file  Food Insecurity: No Food Insecurity (05/05/2022)   Hunger Vital Sign    Worried About Running Out of Food in the Last Year: Never true    Ran Out of Food in the Last Year: Never true  Transportation Needs: No Transportation Needs (05/05/2022)   PRAPARE - Hydrologist (Medical): No    Lack of Transportation (Non-Medical): No  Physical Activity: Not on file  Stress: Not on file  Social Connections: Not on file  Intimate Partner Violence: Not At Risk (05/05/2022)   Humiliation, Afraid, Rape, and Kick questionnaire    Fear of Current or Ex-Partner: No    Emotionally Abused: No    Physically Abused: No    Sexually Abused: No    FAMILY  HISTORY:  Family History  Problem Relation Age of Onset   Bladder Cancer Mother 13   Cervical cancer Mother        dx 9s, hysterectomy   Hypertension Father    Cirrhosis Father    Leukemia Brother 6   Liver cancer Brother 48   Breast cancer Maternal Aunt        dx 66s   Cancer Maternal Uncle        unk type d. 79   Cancer Paternal Aunt        unk type   Breast cancer Maternal Grandmother        dx 52s   Uterine cancer Maternal Grandmother     CURRENT MEDICATIONS:  Current Outpatient Medications  Medication Sig Dispense Refill   ALPRAZolam (XANAX) 0.25 MG tablet Take 1 tablet (0.25 mg total) by mouth 2 (two) times daily as needed for anxiety. (Patient taking differently: Take 0.25 mg by mouth  2 (two) times daily.) 60 tablet 2   amLODipine (NORVASC) 10 MG tablet Take 10 mg by mouth daily.     anastrozole (ARIMIDEX) 1 MG tablet Take 1 tablet (1 mg total) by mouth daily. 90 tablet 4   apixaban (ELIQUIS) 5 MG TABS tablet Take 1 tablet (5 mg total) by mouth 2 (two) times daily. 60 tablet 5   aspirin EC 81 MG tablet Take 1 tablet (81 mg total) by mouth daily. 90 tablet 3   Calcium Carb-Cholecalciferol (CALCIUM 600/VITAMIN D3 PO) Take 1 tablet by mouth in the morning and at bedtime.     CARBOPLATIN IV Inject into the vein every 21 ( twenty-one) days.     DOCEtaxel (TAXOTERE IV) Inject into the vein every 21 ( twenty-one) days.     Evolocumab (REPATHA SURECLICK) 250 MG/ML SOAJ Inject 140 mg into the skin every 14 (fourteen) days. 2 mL 11   gabapentin (NEURONTIN) 300 MG capsule Take 300 mg by mouth 2 (two) times daily.     metoprolol tartrate (LOPRESSOR) 25 MG tablet Take 1 tablet (25 mg total) by mouth 2 (two) times daily. Please call and schedule overdue appointment with Dr. Gwenlyn Found for further refills . SECOND ATTEMPT! 30 tablet 0   omeprazole (PRILOSEC OTC) 20 MG tablet Take 20 mg by mouth daily.     ondansetron (ZOFRAN-ODT) 4 MG disintegrating tablet Take 1 tablet (4 mg total) by mouth  every 6 (six) hours as needed for nausea. 20 tablet 0   oxyCODONE (OXY IR/ROXICODONE) 5 MG immediate release tablet Take 1-2 tablets (5-10 mg total) by mouth every 4 (four) hours as needed for severe pain or breakthrough pain. 30 tablet 0   pegfilgrastim-bmez (ZIEXTENZO) 6 MG/0.6ML injection Inject 6 mg into the skin every 21 ( twenty-one) days. AFTER chemotherapy.     Pertuzumab (PERJETA IV) Inject into the vein every 21 ( twenty-one) days.     trastuzumab-anns (KANJINTI) 150 MG SOLR injection Inject 462 mg into the vein every 21 ( twenty-one) days.     No current facility-administered medications for this visit.   Facility-Administered Medications Ordered in Other Visits  Medication Dose Route Frequency Provider Last Rate Last Admin   magnesium sulfate IVPB 2 g 50 mL  2 g Intravenous Once Derek Jack, MD       sodium chloride flush (NS) 0.9 % injection 10 mL  10 mL Intracatheter PRN Derek Jack, MD   10 mL at 06/03/22 1446    ALLERGIES:  No Known Allergies  PHYSICAL EXAM:  Performance status (ECOG): 1 - Symptomatic but completely ambulatory  There were no vitals filed for this visit. Wt Readings from Last 3 Encounters:  06/03/22 149 lb 6.4 oz (67.8 kg)  05/18/22 145 lb (65.8 kg)  05/12/22 146 lb (66.2 kg)   Physical Exam Vitals reviewed.  Constitutional:      Appearance: Normal appearance.  Cardiovascular:     Rate and Rhythm: Normal rate and regular rhythm.     Pulses: Normal pulses.     Heart sounds: Normal heart sounds.  Pulmonary:     Effort: Pulmonary effort is normal.     Breath sounds: Normal breath sounds.  Neurological:     General: No focal deficit present.     Mental Status: She is alert and oriented to person, place, and time.  Psychiatric:        Mood and Affect: Mood normal.        Behavior: Behavior normal.     LABORATORY  DATA:  I have reviewed the labs as listed.     Latest Ref Rng & Units 06/03/2022    9:39 AM 05/06/2022    5:41  AM 04/22/2022    9:26 AM  CBC  WBC 4.0 - 10.5 K/uL 6.5  10.1  7.1   Hemoglobin 12.0 - 15.0 g/dL 10.9  9.2  10.9   Hematocrit 36.0 - 46.0 % 33.8  28.7  34.1   Platelets 150 - 400 K/uL 186  175  198       Latest Ref Rng & Units 06/03/2022    9:39 AM 05/06/2022    5:41 AM 04/22/2022    9:26 AM  CMP  Glucose 70 - 99 mg/dL 96  100  106   BUN 8 - 23 mg/dL 33  30  34   Creatinine 0.44 - 1.00 mg/dL 1.10  1.42  1.41   Sodium 135 - 145 mmol/L 140  137  137   Potassium 3.5 - 5.1 mmol/L 4.2  4.1  4.6   Chloride 98 - 111 mmol/L 110  107  108   CO2 22 - 32 mmol/L _0 Calcium 8.9 - 10.3 mg/dL 9.6  9.1  9.8   Total Protein 6.5 - 8.1 g/dL 6.6   7.1   Total Bilirubin 0.3 - 1.2 mg/dL 0.5   0.9   Alkaline Phos 38 - 126 U/L 140   162   AST 15 - 41 U/L 14   18   ALT 0 - 44 U/L 14   19     DIAGNOSTIC IMAGING:  I have independently reviewed the scans and discussed with the patient. MM BREAST SURGICAL SPECIMEN  Result Date: 05/05/2022 CLINICAL DATA:  Evaluate surgical specimen following excision of RIGHT breast lobular neoplasia. EXAM: SPECIMEN RADIOGRAPH OF THE RIGHT BREAST COMPARISON:  Previous exam(s). FINDINGS: Status post excision of the RIGHT breast. The RF tag, COIL biopsy clip and BARBELL biopsy clip are present, completely intact, and were marked for pathology. IMPRESSION: Specimen radiograph of the RIGHT breast. Electronically Signed   By: Margarette Canada M.D.   On: 05/05/2022 08:50    ASSESSMENT:  Locally advanced HER2 positive left breast cancer: - Patient felt lump over the last 1 year. - She noticed lump has gotten bigger and deformed since September 2022.  She has itching over the skin of the breast. - Left breast 4:00 biopsy: Invasive mammary carcinoma, grade 2, ER 100%, PR 70%, Ki-67 25%, HER2 FISH positive - Left breast 2:00 biopsy: Invasive ductal carcinoma, grade 2, ER 100%, PR 70%, Ki-67 25%, HER2 negative - Left axillary lymph node biopsy: IDC, ER 100%, PR 50%, HER2 negative,  E-cadherin positive - Bilateral breast MRI on 10/09/2021: Multiple enhancing masses with intervening non-mass enhancement involving most of the LOQ and part of the UOQ.  Abnormal enhancement spans approximately 12.4 x 6.4 x 7 cm.  Diffuse skin thickening/edema.  Multiple left axillary lymph nodes, largest node measuring approximately 2.4 cm in the lower axilla.  Indeterminate non-mass enhancement inferior to the biopsy site in the subareolar right breast measuring 1.5 x 0.9 x 0.8 cm. - Right breast biopsy UOQ on 10/01/2021: Fibroadenomatoid nodule with calcifications, fibrocystic changes including apocrine metaplasia with no malignancy. -PET scan from 10/22/2021: Large left breast mass and numerous hypermetabolic left axillary and subpectoral lymph nodes.  2 hypermetabolic left upper lobe lung nodules measuring 7 mm with SUV 4.2 and 3.2 respectively.  No other hypermetabolic lung nodules.  No  distant metastatic disease.  Nonspecific low-level subareolar activity in the right breast near the surgical clips and a single mildly hypermetabolic right axillary lymph node. - Echocardiogram shows EF 60 to 65%. - She has oligometastatic disease with 2 small lung nodules.  Biopsy is not feasible.  I have recommended TCHP regimen followed by restaging after 3 cycles. -6 cycles of dose reduced TCHP from 11/04/2021 through 03/11/2022 - Maintenance Herceptin and pertuzumab started on 04/01/2022, anastrozole started on 04/01/2022 -Left mastectomy and lymph node dissection (05/05/2022): [id number], residual IDC, grade 2, discontinuously involving fibrotic area measuring 5.9 cm, margins negative, LVI positive, response to therapy seen including hyalin fibrosis and dystrophic calcifications.  9/18 lymph nodes positive, largest focus of metastatic carcinoma measures 1.2 cm with extranodal extension.  Right breast lumpectomy shows atypical lobular hyperplasia.   Social/family history: - She lives at home with her husband and daughter.   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  Current active smoker, half pack per day since age 47. - Mother had cervical cancer and bladder cancer.  Maternal grandmother had ovarian cancer.  Brother died at age 14 from acute leukemia.  Another brother died of liver cancer.  Maternal half aunt had breast cancer.   PLAN:  Oligometastatic (two 7 mm lung lesions) HER2 positive left breast cancer: - We have reviewed the pathology reports with the patient in detail. - Tumor has shown changes consistent with response to therapy. - She is tolerating anastrozole very well. - I have reviewed her labs today which shows near normal CBC.  Chronically elevated alk phos is stable.  Other LFTs are normal.  Mildly elevated creatinine is stable at 1.1. - Proceed with maintenance Herceptin and pertuzumab today.  RTC 3 weeks for follow-up.  I will arrange CT CAP and bone scan prior to next visit as she is complaining of back pain for the last 1 month at T12 level, only when she bends forward, lasting few minutes, not requiring pain medication.  If the lung nodules does not show any progression or continue to regress, and no evidence of metastatic disease we will also consider?  XRT.  2.  Right facial shingles/postherpetic right hemifacial pain: - Continue gabapentin 300 mg 3 times daily.  3.  Hypomagnesemia: - She cannot take magnesium due to nausea.  Magnesium is 1.4 today.  She will receive IV magnesium.  4.  Anxiety: - Continue Xanax 0.25 mg daily.  5.  Hypertension: - Continue amlodipine.  Blood pressure is 127/83.  6.  Left leg DVT (diagnosed 03/04/2022): - Continue Eliquis twice daily.  No bleeding issues.  7.  Osteopenia (DEXA 04/19/2022 T score -2.1): - Continue calcium and vitamin D supplements.          Orders placed this encounter:  Orders Placed This Encounter  Procedures   NM Bone Scan Whole Body      Derek Jack, MD [place] [phone]

## 2022-06-03 NOTE — Progress Notes (Signed)
Patient has been examined by Dr. Delton Coombes, and vital signs and labs have been reviewed. ANC, Creatinine, LFTs, hemoglobin, and platelets are within treatment parameters per M.D. - pt may proceed with treatment.  Will give magnesium 4 g IV per MD. Primary RN and pharmacy notified.

## 2022-06-03 NOTE — Patient Instructions (Signed)
[facility]  Discharge Instructions: Thank you for choosing [facility] to provide your oncology and hematology care.  If you have a lab appointment with the [facility], please come in thru the Main Entrance and check in at the main information desk.  Wear comfortable clothing and clothing appropriate for easy access to any Portacath or PICC line.   We strive to give you quality time with your provider. You may need to reschedule your appointment if you arrive late (15 or more minutes).  Arriving late affects you and other patients whose appointments are after yours.  Also, if you miss three or more appointments without notifying the office, you may be dismissed from the clinic at the provider's discretion.      For prescription refill requests, have your pharmacy contact our office and allow 72 hours for refills to be completed.    Today you received the following chemotherapy and/or immunotherapy agents Kanjinti and Perjeta      To help prevent nausea and vomiting after your treatment, we encourage you to take your nausea medication as directed.  Trastuzumab Injection What is this medication? TRASTUZUMAB (tras TOO zoo mab) treats breast cancer and stomach cancer. It works by blocking a protein that causes cancer cells to grow and multiply. This helps to slow or stop the spread of cancer cells. This medicine may be used for other purposes; ask your health care provider or pharmacist if you have questions. COMMON BRAND NAME(S): Herceptin, Janae Bridgeman, Ontruzant, Trazimera What should I tell my care team before I take this medication? They need to know if you have any of these conditions: Heart failure Lung disease An unusual or allergic reaction to trastuzumab, other medications, foods, dyes, or preservatives Pregnant or trying to get pregnant Breast-feeding How should I use this medication? This medication is injected into a vein. It is  given by your care team in a hospital or clinic setting. Talk to your care team about the use of this medication in children. It is not approved for use in children. Overdosage: If you think you have taken too much of this medicine contact a poison control center or emergency room at once. NOTE: This medicine is only for you. Do not share this medicine with others. What if I miss a dose? Keep appointments for follow-up doses. It is important not to miss your dose. Call your care team if you are unable to keep an appointment. What may interact with this medication? Certain types of chemotherapy, such as daunorubicin, doxorubicin, epirubicin, idarubicin This list may not describe all possible interactions. Give your health care provider a list of all the medicines, herbs, non-prescription drugs, or dietary supplements you use. Also tell them if you smoke, drink alcohol, or use illegal drugs. Some items may interact with your medicine. What should I watch for while using this medication? Your condition will be monitored carefully while you are receiving this medication. This medication may make you feel generally unwell. This is not uncommon, as chemotherapy affects healthy cells as well as cancer cells. Report any side effects. Continue your course of treatment even though you feel ill unless your care team tells you to stop. This medication may increase your risk of getting an infection. Call your care team for advice if you get a fever, chills, sore throat, or other symptoms of a cold or flu. Do not treat yourself. Try to avoid being around people who are sick. Avoid taking medications  that contain aspirin, acetaminophen, ibuprofen, naproxen, or ketoprofen unless instructed by your care team. These medications can hide a fever. Talk to your care team if you may be pregnant. Serious birth defects can occur if you take this medication during pregnancy and for 7 months after the last dose. You will need a  negative pregnancy test before starting this medication. Contraception is recommended while taking this medication and for 7 months after the last dose. Your care team can help you find the option that works for you. Do not breastfeed while taking this medication and for 7 months after stopping treatment. What side effects may I notice from receiving this medication? Side effects that you should report to your care team as soon as possible: Allergic reactions or angioedema--skin rash, itching or hives, swelling of the face, eyes, lips, tongue, arms, or legs, trouble swallowing or breathing Dry cough, shortness of breath or trouble breathing Heart failure--shortness of breath, swelling of the ankles, feet, or hands, sudden weight gain, unusual weakness or fatigue Infection--fever, chills, cough, or sore throat Infusion reactions--chest pain, shortness of breath or trouble breathing, feeling faint or lightheaded Side effects that usually do not require medical attention (report to your care team if they continue or are bothersome): Diarrhea Dizziness Headache Nausea Trouble sleeping Vomiting This list may not describe all possible side effects. Call your doctor for medical advice about side effects. You may report side effects to FDA at 1-800-FDA-1088. Where should I keep my medication? This medication is given in a hospital or clinic. It will not be stored at home. NOTE: This sheet is a summary. It may not cover all possible information. If you have questions about this medicine, talk to your doctor, pharmacist, or health care provider.  2023 Elsevier/Gold Standard (2021-12-22 00:00:00)  Pertuzumab Injection What is this medication? PERTUZUMAB (per TOOZ ue mab) treats breast cancer. It works by blocking a protein that causes cancer cells to grow and multiply. This helps to slow or stop the spread of cancer cells. It is a monoclonal antibody. This medicine may be used for other purposes; ask  your health care provider or pharmacist if you have questions. COMMON BRAND NAME(S): PERJETA What should I tell my care team before I take this medication? They need to know if you have any of these conditions: Heart failure An unusual or allergic reaction to pertuzumab, other medications, foods, dyes, or preservatives Pregnant or trying to get pregnant Breast-feeding How should I use this medication? This medication is injected into a vein. It is given by your care team in a hospital or clinic setting. Talk to your care team about the use of this medication in children. Special care may be needed. Overdosage: If you think you have taken too much of this medicine contact a poison control center or emergency room at once. NOTE: This medicine is only for you. Do not share this medicine with others. What if I miss a dose? Keep appointments for follow-up doses. It is important not to miss your dose. Call your care team if you are unable to keep an appointment. What may interact with this medication? Interactions are not expected. This list may not describe all possible interactions. Give your health care provider a list of all the medicines, herbs, non-prescription drugs, or dietary supplements you use. Also tell them if you smoke, drink alcohol, or use illegal drugs. Some items may interact with your medicine. What should I watch for while using this medication? Your condition will be  monitored carefully while you are receiving this medication. This medication may make you feel generally unwell. This is not uncommon as chemotherapy can affect healthy cells as well as cancer cells. Report any side effects. Continue your course of treatment even though you feel ill unless your care team tells you to stop. Talk to your care team if you may be pregnant. Serious birth defects can occur if you take this medication during pregnancy and for 7 months after the last dose. You will need a negative pregnancy  test before starting this medication. Contraception is recommended while taking this medication and for 7 months after the last dose. Your care team can help you find the option that works for you. Do not breastfeed while taking this medication and for 7 months after the last dose. What side effects may I notice from receiving this medication? Side effects that you should report to your care team as soon as possible: Allergic reactions or angioedema--skin rash, itching or hives, swelling of the face, eyes, lips, tongue, arms, or legs, trouble swallowing or breathing Heart failure--shortness of breath, swelling of the ankles, feet, or hands, sudden weight gain, unusual weakness or fatigue Infusion reactions--chest pain, shortness of breath or trouble breathing, feeling faint or lightheaded Side effects that usually do not require medical attention (report to your care team if they continue or are bothersome): Diarrhea Dry skin Fatigue Hair loss Nausea Vomiting This list may not describe all possible side effects. Call your doctor for medical advice about side effects. You may report side effects to FDA at 1-800-FDA-1088. Where should I keep my medication? This medication is given in a hospital or clinic. It will not be stored at home. NOTE: This sheet is a summary. It may not cover all possible information. If you have questions about this medicine, talk to your doctor, pharmacist, or health care provider.  2023 Elsevier/Gold Standard (2021-12-10 00:00:00)    BELOW ARE SYMPTOMS THAT SHOULD BE REPORTED IMMEDIATELY: *FEVER GREATER THAN 100.4 F (38 C) OR HIGHER *CHILLS OR SWEATING *NAUSEA AND VOMITING THAT IS NOT CONTROLLED WITH YOUR NAUSEA MEDICATION *UNUSUAL SHORTNESS OF BREATH *UNUSUAL BRUISING OR BLEEDING *URINARY PROBLEMS (pain or burning when urinating, or frequent urination) *BOWEL PROBLEMS (unusual diarrhea, constipation, pain near the anus) TENDERNESS IN MOUTH AND THROAT WITH OR  WITHOUT PRESENCE OF ULCERS (sore throat, sores in mouth, or a toothache) UNUSUAL RASH, SWELLING OR PAIN  UNUSUAL VAGINAL DISCHARGE OR ITCHING   Items with * indicate a potential emergency and should be followed up as soon as possible or go to the Emergency Department if any problems should occur.  Please show the CHEMOTHERAPY ALERT CARD or IMMUNOTHERAPY ALERT CARD at check-in to the Emergency Department and triage nurse.  Should you have questions after your visit or need to cancel or reschedule your appointment, please contact [facility] [phone]  and follow the prompts.  Office hours are 8:00 a.m. to 4:30 p.m. Monday - Friday. Please note that voicemails left after 4:00 p.m. may not be returned until the following business day.  We are closed weekends and major holidays. You have access to a nurse at all times for urgent questions. Please call the main number to the clinic [phone] and follow the prompts.  For any non-urgent questions, you may also contact your provider using MyChart. We now offer e-Visits for anyone 5 and older to request care online for non-urgent symptoms. For details visit [email or web address].   Also download the MyChart app! Go  to the app store, search "MyChart", open the app, select [facility], and log in with your MyChart username and password.  Masks are optional in the cancer centers. If you would like for your care team to wear a mask while they are taking care of you, please let them know. You may have one support person who is at least 65 years old accompany you for your appointments.

## 2022-06-04 LAB — CANCER ANTIGEN 27.29: CA 27.29: 36.5 U/mL (ref 0.0–38.6)

## 2022-06-05 LAB — CANCER ANTIGEN 15-3: CA 15-3: 29.6 U/mL — ABNORMAL HIGH (ref 0.0–25.0)

## 2022-06-07 ENCOUNTER — Other Ambulatory Visit (HOSPITAL_COMMUNITY): Payer: Self-pay | Admitting: Physician Assistant

## 2022-06-07 DIAGNOSIS — F419 Anxiety disorder, unspecified: Secondary | ICD-10-CM

## 2022-06-10 ENCOUNTER — Other Ambulatory Visit: Payer: Self-pay

## 2022-06-10 DIAGNOSIS — F419 Anxiety disorder, unspecified: Secondary | ICD-10-CM

## 2022-06-11 ENCOUNTER — Other Ambulatory Visit: Payer: Self-pay

## 2022-06-11 ENCOUNTER — Encounter: Payer: Self-pay | Admitting: Hematology

## 2022-06-11 ENCOUNTER — Encounter (HOSPITAL_COMMUNITY): Payer: Self-pay | Admitting: Hematology

## 2022-06-11 MED ORDER — ALPRAZOLAM 0.25 MG PO TABS: 0.2500 mg | ORAL_TABLET | Freq: Two times a day (BID) | ORAL | 2 refills | Status: DC | PRN

## 2022-06-16 ENCOUNTER — Other Ambulatory Visit: Payer: Self-pay

## 2022-06-17 ENCOUNTER — Ambulatory Visit (INDEPENDENT_AMBULATORY_CARE_PROVIDER_SITE_OTHER): Payer: Managed Care, Other (non HMO) | Admitting: General Surgery

## 2022-06-17 ENCOUNTER — Ambulatory Visit (HOSPITAL_COMMUNITY)
Admission: RE | Admit: 2022-06-17 | Discharge: 2022-06-17 | Disposition: A | Discharge status: Home / Self Care | Payer: Managed Care, Other (non HMO) | Source: Ambulatory Visit | Attending: Hematology | Admitting: Hematology

## 2022-06-17 ENCOUNTER — Encounter: Payer: Self-pay | Admitting: General Surgery

## 2022-06-17 VITALS — BP 121/75 | HR 65 | Temp 98.2°F | Resp 12 | Ht 60.0 in | Wt 149.0 lb

## 2022-06-17 DIAGNOSIS — Z170 Estrogen receptor positive status [ER+]: Secondary | ICD-10-CM | POA: Insufficient documentation

## 2022-06-17 DIAGNOSIS — C50812 Malignant neoplasm of overlapping sites of left female breast: Secondary | ICD-10-CM | POA: Insufficient documentation

## 2022-06-17 DIAGNOSIS — C773 Secondary and unspecified malignant neoplasm of axilla and upper limb lymph nodes: Secondary | ICD-10-CM

## 2022-06-17 DIAGNOSIS — C50912 Malignant neoplasm of unspecified site of left female breast: Secondary | ICD-10-CM

## 2022-06-17 MED ORDER — IOHEXOL 300 MG/ML  SOLN
100.0000 mL | Freq: Once | INTRAMUSCULAR | Status: AC | PRN
  Administered 2022-06-17: 100 mL via INTRAVENOUS

## 2022-06-17 NOTE — Patient Instructions (Addendum)
Continue your exercises. Continue seeing Dr. Delton Coombes. Call with issues.

## 2022-06-21 NOTE — Progress Notes (Signed)
[facility]  Doing well and no major issues. Healing.   BP 121/75   Pulse 65   Temp 98.2 F (36.8 C) (Oral)   Resp 12   Ht 5' (1.524 m)   Wt 149 lb (67.6 kg)   SpO2 98%   BMI 29.10 kg/m   Left breast incision site healing, no erythema or drainage, minor induration/ swelling on the inferior flap, minor seroma in the axilla, moves arm well  Patient s/p left breast modified radical mastectomy. Doing well.  Continue your exercises.  Continue seeing Dr. Delton Coombes. Call with issues.    Curlene Labrum, MD [facility] [address] [place], [place] [address] [phone] (office)

## 2022-06-22 ENCOUNTER — Encounter (HOSPITAL_COMMUNITY)
Admission: RE | Admit: 2022-06-22 | Discharge: 2022-06-22 | Disposition: A | Discharge status: Home / Self Care | Payer: Managed Care, Other (non HMO) | Source: Ambulatory Visit | Attending: Hematology | Admitting: Hematology

## 2022-06-22 ENCOUNTER — Encounter (HOSPITAL_COMMUNITY): Payer: Self-pay

## 2022-06-22 DIAGNOSIS — Z170 Estrogen receptor positive status [ER+]: Secondary | ICD-10-CM | POA: Diagnosis present

## 2022-06-22 DIAGNOSIS — C50812 Malignant neoplasm of overlapping sites of left female breast: Secondary | ICD-10-CM | POA: Insufficient documentation

## 2022-06-22 MED ORDER — TECHNETIUM TC 99M MEDRONATE IV KIT
20.0000 | PACK | Freq: Once | INTRAVENOUS | Status: AC | PRN
  Administered 2022-06-22: 20.5 via INTRAVENOUS

## 2022-06-24 ENCOUNTER — Inpatient Hospital Stay: Payer: Managed Care, Other (non HMO)

## 2022-06-24 ENCOUNTER — Inpatient Hospital Stay: Payer: Managed Care, Other (non HMO) | Attending: Oncology | Admitting: Hematology

## 2022-06-24 VITALS — BP 107/58 | HR 75 | Temp 97.7°F | Resp 18

## 2022-06-24 DIAGNOSIS — Z170 Estrogen receptor positive status [ER+]: Secondary | ICD-10-CM | POA: Diagnosis not present

## 2022-06-24 DIAGNOSIS — Z8041 Family history of malignant neoplasm of ovary: Secondary | ICD-10-CM | POA: Diagnosis not present

## 2022-06-24 DIAGNOSIS — Z1502 Genetic susceptibility to malignant neoplasm of ovary: Secondary | ICD-10-CM | POA: Diagnosis not present

## 2022-06-24 DIAGNOSIS — Z5111 Encounter for antineoplastic chemotherapy: Secondary | ICD-10-CM | POA: Diagnosis present

## 2022-06-24 DIAGNOSIS — Z9012 Acquired absence of left breast and nipple: Secondary | ICD-10-CM | POA: Insufficient documentation

## 2022-06-24 DIAGNOSIS — C50812 Malignant neoplasm of overlapping sites of left female breast: Secondary | ICD-10-CM | POA: Insufficient documentation

## 2022-06-24 DIAGNOSIS — N6011 Diffuse cystic mastopathy of right breast: Secondary | ICD-10-CM | POA: Diagnosis not present

## 2022-06-24 DIAGNOSIS — Z79899 Other long term (current) drug therapy: Secondary | ICD-10-CM | POA: Insufficient documentation

## 2022-06-24 DIAGNOSIS — Z79811 Long term (current) use of aromatase inhibitors: Secondary | ICD-10-CM | POA: Diagnosis not present

## 2022-06-24 DIAGNOSIS — Z95828 Presence of other vascular implants and grafts: Secondary | ICD-10-CM

## 2022-06-24 LAB — COMPREHENSIVE METABOLIC PANEL
ALT: 15 U/L (ref 0–44)
AST: 16 U/L (ref 15–41)
Albumin: 3.7 g/dL (ref 3.5–5.0)
Alkaline Phosphatase: 157 U/L — ABNORMAL HIGH (ref 38–126)
Anion gap: 7 (ref 5–15)
BUN: 32 mg/dL — ABNORMAL HIGH (ref 8–23)
CO2: 24 mmol/L (ref 22–32)
Calcium: 9.5 mg/dL (ref 8.9–10.3)
Chloride: 108 mmol/L (ref 98–111)
Creatinine, Ser: 1.35 mg/dL — ABNORMAL HIGH (ref 0.44–1.00)
GFR, Estimated: 44 mL/min — ABNORMAL LOW (ref >60)
Glucose, Bld: 101 mg/dL — ABNORMAL HIGH (ref 70–99)
Potassium: 4.4 mmol/L (ref 3.5–5.1)
Sodium: 139 mmol/L (ref 135–145)
Total Bilirubin: 0.6 mg/dL (ref 0.3–1.2)
Total Protein: 6.9 g/dL (ref 6.5–8.1)

## 2022-06-24 LAB — CBC WITH DIFFERENTIAL/PLATELET
Abs Immature Granulocytes: 0.04 10*3/uL (ref 0.00–0.07)
Basophils Absolute: 0.1 10*3/uL (ref 0.0–0.1)
Basophils Relative: 1 %
Eosinophils Absolute: 0.5 10*3/uL (ref 0.0–0.5)
Eosinophils Relative: 7 %
HCT: 34.2 % — ABNORMAL LOW (ref 36.0–46.0)
Hemoglobin: 11.1 g/dL — ABNORMAL LOW (ref 12.0–15.0)
Immature Granulocytes: 1 %
Lymphocytes Relative: 18 %
Lymphs Abs: 1.3 10*3/uL (ref 0.7–4.0)
MCH: 31.9 pg (ref 26.0–34.0)
MCHC: 32.5 g/dL (ref 30.0–36.0)
MCV: 98.3 fL (ref 80.0–100.0)
Monocytes Absolute: 0.5 10*3/uL (ref 0.1–1.0)
Monocytes Relative: 7 %
Neutro Abs: 4.7 10*3/uL (ref 1.7–7.7)
Neutrophils Relative %: 66 %
Platelets: 186 10*3/uL (ref 150–400)
RBC: 3.48 MIL/uL — ABNORMAL LOW (ref 3.87–5.11)
RDW: 14.6 % (ref 11.5–15.5)
WBC: 7 10*3/uL (ref 4.0–10.5)
nRBC: 0 % (ref 0.0–0.2)

## 2022-06-24 LAB — MAGNESIUM: Magnesium: 1.3 mg/dL — ABNORMAL LOW (ref 1.7–2.4)

## 2022-06-24 MED ORDER — MAGNESIUM OXIDE -MG SUPPLEMENT 400 (240 MG) MG PO TABS: 400.0000 mg | ORAL_TABLET | Freq: Three times a day (TID) | ORAL | 4 refills | Status: DC

## 2022-06-24 MED ORDER — TRASTUZUMAB-ANNS CHEMO 150 MG IV SOLR
6.0000 mg/kg | Freq: Once | INTRAVENOUS | Status: AC
  Administered 2022-06-24: 399 mg via INTRAVENOUS
  Filled 2022-06-24: qty 19

## 2022-06-24 MED ORDER — SODIUM CHLORIDE 0.9% FLUSH
10.0000 mL | INTRAVENOUS | Status: DC | PRN
  Administered 2022-06-24: 10 mL

## 2022-06-24 MED ORDER — SODIUM CHLORIDE 0.9 % IV SOLN
420.0000 mg | Freq: Once | INTRAVENOUS | Status: AC
  Administered 2022-06-24: 420 mg via INTRAVENOUS
  Filled 2022-06-24: qty 14

## 2022-06-24 MED ORDER — DIPHENHYDRAMINE HCL 25 MG PO CAPS
25.0000 mg | ORAL_CAPSULE | Freq: Once | ORAL | Status: AC
  Administered 2022-06-24: 25 mg via ORAL
  Filled 2022-06-24: qty 1

## 2022-06-24 MED ORDER — SODIUM CHLORIDE 0.9 % IV SOLN: Freq: Once | INTRAVENOUS | Status: AC

## 2022-06-24 MED ORDER — MAGNESIUM SULFATE 2 GM/50ML IV SOLN
2.0000 g | INTRAVENOUS | Status: AC
  Administered 2022-06-24 (×2): 2 g via INTRAVENOUS
  Filled 2022-06-24 (×2): qty 50

## 2022-06-24 MED ORDER — HEPARIN SOD (PORK) LOCK FLUSH 100 UNIT/ML IV SOLN
500.0000 [IU] | Freq: Once | INTRAVENOUS | Status: AC | PRN
  Administered 2022-06-24: 500 [IU]

## 2022-06-24 MED ORDER — ACETAMINOPHEN 325 MG PO TABS
650.0000 mg | ORAL_TABLET | Freq: Once | ORAL | Status: DC
  Filled 2022-06-24: qty 2

## 2022-06-24 NOTE — Progress Notes (Signed)
[facility] [address] [place] [address]   CLINIC:  Medical Oncology/Hematology  PCP:  Celene Squibb, MD [name] [place] [address] [phone]   REASON FOR VISIT:  Follow-up for locally advanced left breast cancer  PRIOR THERAPY: TCHP x 6 cycles.  NGS Results: not done  CURRENT THERAPY: Anastrozole daily, trastuzumab and Herceptin every 21 days. BRIEF ONCOLOGIC HISTORY:  Oncology History  Breast cancer, left ([facility])  10/15/2021 Initial Diagnosis   Breast cancer, left ([facility])   11/04/2021 - 04/22/2022 Chemotherapy   Patient is on Treatment Plan : BREAST  Docetaxel + Carboplatin + Trastuzumab + Pertuzumab  (TCHP) q21d       Genetic Testing   Negative genetic testing. No pathogenic variants identified on the [facility] Multi-Cancer+RNA panel. The report date is 11/02/2021.   The Multi-Cancer Panel + RNA offered by [facility] includes sequencing and/or deletion duplication testing of the following 84 genes: AIP, ALK, APC, ATM, AXIN2,BAP1,  BARD1, BLM, BMPR1A, BRCA1, BRCA2, BRIP1, CASR, CDC73, CDH1, CDK4, CDKN1B, CDKN1C, CDKN2A (p14ARF), CDKN2A (p16INK4a), CEBPA, CHEK2, CTNNA1, DICER1, DIS3L2, EGFR (c.2369C>T, p.Thr790Met variant only), EPCAM (Deletion/duplication testing only), FH, FLCN, GATA2, GPC3, GREM1 (Promoter region deletion/duplication testing only), HOXB13 (c.251G>A, p.Gly84Glu), HRAS, KIT, MAX, MEN1, MET, MITF (c.952G>A, p.Glu318Lys variant only), MLH1, MSH2, MSH3, MSH6, MUTYH, NBN, NF1, NF2, NTHL1, PALB2, PDGFRA, PHOX2B, PMS2, POLD1, POLE, POT1, PRKAR1A, PTCH1, PTEN, RAD50, RAD51C, RAD51D, RB1, RECQL4, RET, RUNX1, SDHAF2, SDHA (sequence changes only), SDHB, SDHC, SDHD, SMAD4, SMARCA4, SMARCB1, SMARCE1, STK11, SUFU, TERC, TERT, TMEM127, TP53, TSC1, TSC2, VHL, WRN and WT1.   11/04/2021 -  Chemotherapy   Patient is on Treatment Plan : BREAST  Docetaxel + Carboplatin + Trastuzumab + Pertuzumab  (TCHP) q21d / Trastuzumab + Pertuzumab q21d        CANCER STAGING:  Cancer Staging  Breast cancer, left ([facility]) Staging form: Breast, AJCC 8th Edition - Clinical stage from 10/15/2021: Stage IIA (cT3, cN1, cM0, G2, ER+, PR+, HER2+) - Unsigned   INTERVAL HISTORY:  Ms. PHILLIPPA STRAUB, a 66 y.o. female, seen for follow-up of HER2 positive left breast cancer.  She continues to have mid back pain which she reports is worse this when she is doing activities like laundry and dishes.  If she is not moving she does not have pain.  Denies any signs or symptoms of PND or orthopnea.  Denies any diarrhea.  Energy levels are 80%.  REVIEW OF SYSTEMS:  Review of Systems  Musculoskeletal:  Positive for back pain.  Neurological:  Negative for numbness.  All other systems reviewed and are negative.   PAST MEDICAL/SURGICAL HISTORY:  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 DES to midLAD, 75% stenosis of RCA not treated   Cancer [facility])    Current smoker    Essential hypertension    Family history of bladder cancer    Family history of breast cancer    Family history of leukemia    Family history of uterine cancer    Hyperlipidemia LDL goal <70    Myocardial infarction ([facility])    PONV (postoperative nausea and vomiting)    Port-A-Cath in place 10/29/2021   Past Surgical History:  Procedure Laterality Date   ABDOMINAL HYSTERECTOMY     BREAST BIOPSY Bilateral 10/16/2021   BREAST BIOPSY WITH RADIO FREQUENCY LOCALIZER Right 05/05/2022   Procedure: BREAST BIOPSY WITH RADIO FREQUENCY LOCALIZER;  Surgeon: Virl Cagey,  MD;  Location: [facility];  Service: General;  Laterality: Right;   CORONARY STENT INTERVENTION N/A 01/10/2018   Procedure: CORONARY STENT INTERVENTION;  Surgeon: Jettie Booze, MD;  Location: [facility];  Service: Cardiovascular;  Laterality: N/A;   LEFT HEART CATH AND CORONARY ANGIOGRAPHY N/A 01/10/2018   Procedure: LEFT HEART CATH AND CORONARY ANGIOGRAPHY;   Surgeon: Jettie Booze, MD;  Location: [facility];  Service: Cardiovascular;  Laterality: N/A;   MASTECTOMY MODIFIED RADICAL Left 05/05/2022   Procedure: MASTECTOMY MODIFIED RADICAL WITH RADIO FREQUENCY LOCALIZER;  Surgeon: Virl Cagey, MD;  Location: [facility];  Service: General;  Laterality: Left;   PORTACATH PLACEMENT Right 10/26/2021   Procedure: INSERTION PORT-A-CATH;  Surgeon: Virl Cagey, MD;  Location: [facility];  Service: General;  Laterality: Right;    SOCIAL HISTORY:  Social History   Socioeconomic History   Marital status: Married    Spouse name: Not on file   Number of children: Not on file   Years of education: Not on file   Highest education level: Not on file  Occupational History   Not on file  Tobacco Use   Smoking status: Every Day    Packs/day: 1.00    Years: 43.00    Total pack years: 43.00    Types: Cigarettes    Start date: 1976   Smokeless tobacco: Never  Vaping Use   Vaping Use: Never used  Substance and Sexual Activity   Alcohol use: Not Currently   Drug use: Never   Sexual activity: Yes  Other Topics Concern   Not on file  Social History Narrative   Not on file   Social Determinants of Health   Financial Resource Strain: Not on file  Food Insecurity: No Food Insecurity (05/05/2022)   Hunger Vital Sign    Worried About Running Out of Food in the Last Year: Never true    Ran Out of Food in the Last Year: Never true  Transportation Needs: No Transportation Needs (05/05/2022)   PRAPARE - Hydrologist (Medical): No    Lack of Transportation (Non-Medical): No  Physical Activity: Not on file  Stress: Not on file  Social Connections: Not on file  Intimate Partner Violence: Not At Risk (05/05/2022)   Humiliation, Afraid, Rape, and Kick questionnaire    Fear of Current or Ex-Partner: No    Emotionally Abused: No    Physically Abused: No    Sexually Abused: No    FAMILY HISTORY:  Family History   Problem Relation Age of Onset   Bladder Cancer Mother 40   Cervical cancer Mother        dx 39s, hysterectomy   Hypertension Father    Cirrhosis Father    Leukemia Brother 96   Liver cancer Brother 58   Breast cancer Maternal Aunt        dx 9s   Cancer Maternal Uncle        unk type d. 79   Cancer Paternal Aunt        unk type   Breast cancer Maternal Grandmother        dx 23s   Uterine cancer Maternal Grandmother     CURRENT MEDICATIONS:  Current Outpatient Medications  Medication Sig Dispense Refill   ALPRAZolam (XANAX) 0.25 MG tablet Take 1 tablet (0.25 mg total) by mouth 2 (two) times daily as needed for anxiety. 60 tablet 2   amLODipine (NORVASC) 10 MG tablet  Take 10 mg by mouth daily.     anastrozole (ARIMIDEX) 1 MG tablet Take 1 tablet (1 mg total) by mouth daily. 90 tablet 4   apixaban (ELIQUIS) 5 MG TABS tablet Take 1 tablet (5 mg total) by mouth 2 (two) times daily. 60 tablet 5   aspirin EC 81 MG tablet Take 1 tablet (81 mg total) by mouth daily. 90 tablet 3   Calcium Carb-Cholecalciferol (CALCIUM 600/VITAMIN D3 PO) Take 1 tablet by mouth in the morning and at bedtime.     CARBOPLATIN IV Inject into the vein every 21 ( twenty-one) days.     DOCEtaxel (TAXOTERE IV) Inject into the vein every 21 ( twenty-one) days.     Evolocumab (REPATHA SURECLICK) 989 MG/ML SOAJ Inject 140 mg into the skin every 14 (fourteen) days. 2 mL 11   gabapentin (NEURONTIN) 300 MG capsule Take 300 mg by mouth 2 (two) times daily.     metoprolol tartrate (LOPRESSOR) 25 MG tablet Take 1 tablet (25 mg total) by mouth 2 (two) times daily. Please call and schedule overdue appointment with Dr. Gwenlyn Found for further refills . SECOND ATTEMPT! 30 tablet 0   omeprazole (PRILOSEC OTC) 20 MG tablet Take 20 mg by mouth daily.     ondansetron (ZOFRAN-ODT) 4 MG disintegrating tablet Take 1 tablet (4 mg total) by mouth every 6 (six) hours as needed for nausea. 20 tablet 0   oxyCODONE (OXY IR/ROXICODONE) 5 MG  immediate release tablet Take 1-2 tablets (5-10 mg total) by mouth every 4 (four) hours as needed for severe pain or breakthrough pain. 30 tablet 0   pegfilgrastim-bmez (ZIEXTENZO) 6 MG/0.6ML injection Inject 6 mg into the skin every 21 ( twenty-one) days. AFTER chemotherapy.     Pertuzumab (PERJETA IV) Inject into the vein every 21 ( twenty-one) days.     trastuzumab-anns (KANJINTI) 150 MG SOLR injection Inject 462 mg into the vein every 21 ( twenty-one) days.     No current facility-administered medications for this visit.    ALLERGIES:  No Known Allergies  PHYSICAL EXAM:  Performance status (ECOG): 1 - Symptomatic but completely ambulatory  There were no vitals filed for this visit. Wt Readings from Last 3 Encounters:  06/24/22 150 lb 9.6 oz (68.3 kg)  06/17/22 149 lb (67.6 kg)  06/03/22 149 lb 6.4 oz (67.8 kg)   Physical Exam Vitals reviewed.  Constitutional:      Appearance: Normal appearance.  Cardiovascular:     Rate and Rhythm: Normal rate and regular rhythm.     Pulses: Normal pulses.     Heart sounds: Normal heart sounds.  Pulmonary:     Effort: Pulmonary effort is normal.     Breath sounds: Normal breath sounds.  Neurological:     General: No focal deficit present.     Mental Status: She is alert and oriented to person, place, and time.  Psychiatric:        Mood and Affect: Mood normal.        Behavior: Behavior normal.     LABORATORY DATA:  I have reviewed the labs as listed.     Latest Ref Rng & Units 06/03/2022    9:39 AM 05/06/2022    5:41 AM 04/22/2022    9:26 AM  CBC  WBC 4.0 - 10.5 K/uL 6.5  10.1  7.1   Hemoglobin 12.0 - 15.0 g/dL 10.9  9.2  10.9   Hematocrit 36.0 - 46.0 % 33.8  28.7  34.1   Platelets 150 -  400 K/uL 186  175  198       Latest Ref Rng & Units 06/03/2022    9:39 AM 05/06/2022    5:41 AM 04/22/2022    9:26 AM  CMP  Glucose 70 - 99 mg/dL 96  100  106   BUN 8 - 23 mg/dL 33  30  34   Creatinine 0.44 - 1.00 mg/dL 1.10  1.42  1.41    Sodium 135 - 145 mmol/L 140  137  137   Potassium 3.5 - 5.1 mmol/L 4.2  4.1  4.6   Chloride 98 - 111 mmol/L 110  107  108   CO2 22 - 32 mmol/L _0 Calcium 8.9 - 10.3 mg/dL 9.6  9.1  9.8   Total Protein 6.5 - 8.1 g/dL 6.6   7.1   Total Bilirubin 0.3 - 1.2 mg/dL 0.5   0.9   Alkaline Phos 38 - 126 U/L 140   162   AST 15 - 41 U/L 14   18   ALT 0 - 44 U/L 14   19     DIAGNOSTIC IMAGING:  I have independently reviewed the scans and discussed with the patient. NM Bone Scan Whole Body  Result Date: 06/22/2022 CLINICAL DATA:  LEFT-sided breast cancer diagnosed earlier this year, ongoing chemotherapy EXAM: NUCLEAR MEDICINE WHOLE BODY BONE SCAN TECHNIQUE: Whole body anterior and posterior images were obtained approximately 3 hours after intravenous injection of radiopharmaceutical. RADIOPHARMACEUTICALS:  20.5 mCi Technetium-90mMDP IV COMPARISON:  CT chest abdomen pelvis 06/17/2022 FINDINGS: Uptake at shoulders, wrists, hips, knees, feet, typically degenerative. Questionable uptake anterior RIGHT second rib at costochondral junction. No additional sites of abnormal tracer uptake are seen to suggest osseous metastatic disease. Expected urinary tract and soft tissue distribution of tracer. IMPRESSION: Questionable faint uptake at the LEFT anterior second costochondral junction, nonspecific. No definite scintigraphic evidence of osseous metastatic disease. Electronically Signed   By: MLavonia DanaM.D.   On: 06/22/2022 16:36   CT CHEST ABDOMEN PELVIS W CONTRAST  Result Date: 06/21/2022 CLINICAL DATA:  Left-sided breast cancer diagnosed earlier this year with ongoing chemotherapy. * Tracking Code: BO * EXAM: CT CHEST, ABDOMEN, AND PELVIS WITH CONTRAST TECHNIQUE: Multidetector CT imaging of the chest, abdomen and pelvis was performed following the standard protocol during bolus administration of intravenous contrast. RADIATION DOSE REDUCTION: This exam was performed according to the departmental  dose-optimization program which includes automated exposure control, adjustment of the mA and/or kV according to patient size and/or use of iterative reconstruction technique. CONTRAST:  1033mOMNIPAQUE IOHEXOL 300 MG/ML  SOLN COMPARISON:  03/24/2022 FINDINGS: CT CHEST FINDINGS Cardiovascular: Right Port-A-Cath tip superior caval/atrial junction. Aortic atherosclerosis. Tortuous thoracic aorta. Normal heart size, without pericardial effusion. Left main and 3 vessel coronary artery calcification. No central pulmonary embolism, on this non-dedicated study. Mediastinum/Nodes: No supraclavicular adenopathy. Left axillary node dissection. no well-defined adenopathy. Subtle soft tissue fullness caudal to the surgical clips including on 22/2 measures 1.3 cm. No subpectoral or right axillary adenopathy. No mediastinal or hilar adenopathy. No internal mammary adenopathy. Lungs/Pleura: No pleural fluid. Moderate centrilobular emphysema. Dependent bibasilar subsegmental atelectasis. Again identified are subpleural predominant pulmonary nodules which are felt to be primarily similar in size. Example posterior right upper lobe at 4 mm on 38/3, similar. A right lower lobe subpleural 5 mm nodule on 83/3 measured 4 mm previously. Musculoskeletal: Interval left mastectomy. No acute osseous abnormality. CT ABDOMEN PELVIS FINDINGS Hepatobiliary: Normal liver. Normal gallbladder,  without biliary ductal dilatation. Pancreas: Normal, without mass or ductal dilatation. Spleen: Normal in size, without focal abnormality. Adrenals/Urinary Tract: Normal left adrenal gland. Minimal right adrenal nodularity at 6 mm on 52 test to is similar to on the prior. Mild renal cortical thinning bilaterally. No hydronephrosis. The bladder is mildly thick walled with mild pericystic edema including on 103/2. Stomach/Bowel: Normal stomach, without wall thickening. Normal colon, appendix, and terminal ileum. Normal small bowel. Vascular/Lymphatic: Aortic  atherosclerosis. Aortic dilatation at 2.9 cm is similar on the prior. No abdominopelvic adenopathy. Although the venous system is suboptimally evaluated secondary to bolus timing, the previously described deep venous thrombosis within the left-sided system is decreased to resolved. Reproductive: Hysterectomy.  No adnexal mass. Other: No significant free fluid. Moderate pelvic floor laxity. No evidence of omental or peritoneal disease. Fat and soft tissue density process within the presacral space is similar in size and morphology. The most nodular component measures 2.3 cm on 100/2 and is unchanged. Musculoskeletal: Nonspecific skin thickening about the lateral right flank on 74/2 is present on the prior. Mild osteopenia. IMPRESSION: 1. Interval left mastectomy and axillary node dissection. No convincing findings of metastatic disease. 2. Similar pulmonary nodularity, favored to represent subpleural lymph nodes. 3. Subtle soft tissue fullness caudal to left axillary surgical clips, without well-defined adenopathy. Most likely postoperative edema. Recommend attention on follow-up. 4. Minimal right adrenal nodularity is unchanged and most likely benign/incidental. Recommend attention on follow-up. 5. New mild bladder wall thickening and pericystic edema, suspicious for cystitis. 6. Presacral soft tissue and fat density process is similar with differential considerations of extramedullary hematopoiesis, myelolipoma, lipoleiomyoma, teratoma. 7. Aortic atherosclerosis (ICD10-I70.0), coronary artery atherosclerosis and emphysema (ICD10-J43.9). 8. 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 Recommend follow-up ultrasound every 5 years. This recommendation follows ACR consensus guidelines: White Paper of the ACR Incidental Findings Committee II on Vascular Findings. J Am Coll Radiol 2013; 10:789-794. Electronically Signed   By: Abigail Miyamoto M.D.   On: 06/21/2022 11:04     ASSESSMENT:  Locally advanced HER2 positive  left breast cancer: - Patient felt lump over the last 1 year. - She noticed lump has gotten bigger and deformed since September 2022.  She has itching over the skin of the breast. - Left breast 4:00 biopsy: Invasive mammary carcinoma, grade 2, ER 100%, PR 70%, Ki-67 25%, HER2 FISH positive - Left breast 2:00 biopsy: Invasive ductal carcinoma, grade 2, ER 100%, PR 70%, Ki-67 25%, HER2 negative - Left axillary lymph node biopsy: IDC, ER 100%, PR 50%, HER2 negative, E-cadherin positive - Bilateral breast MRI on 10/09/2021: Multiple enhancing masses with intervening non-mass enhancement involving most of the LOQ and part of the UOQ.  Abnormal enhancement spans approximately 12.4 x 6.4 x 7 cm.  Diffuse skin thickening/edema.  Multiple left axillary lymph nodes, largest node measuring approximately 2.4 cm in the lower axilla.  Indeterminate non-mass enhancement inferior to the biopsy site in the subareolar right breast measuring 1.5 x 0.9 x 0.8 cm. - Right breast biopsy UOQ on 10/01/2021: Fibroadenomatoid nodule with calcifications, fibrocystic changes including apocrine metaplasia with no malignancy. -PET scan from 10/22/2021: Large left breast mass and numerous hypermetabolic left axillary and subpectoral lymph nodes.  2 hypermetabolic left upper lobe lung nodules measuring 7 mm with SUV 4.2 and 3.2 respectively.  No other hypermetabolic lung nodules.  No distant metastatic disease.  Nonspecific low-level subareolar activity in the right breast near the surgical clips and a single mildly hypermetabolic right axillary lymph node. -  Echocardiogram shows EF 60 to 65%. - She has oligometastatic disease with 2 small lung nodules.  Biopsy is not feasible.  I have recommended TCHP regimen followed by restaging after 3 cycles. -6 cycles of dose reduced TCHP from 11/04/2021 through 03/11/2022 - Maintenance Herceptin and pertuzumab started on 04/01/2022, anastrozole started on 04/01/2022 -Left mastectomy and lymph node  dissection (05/05/2022): YPT3YPN2A, residual IDC, grade 2, discontinuously involving fibrotic area measuring 5.9 cm, margins negative, LVI positive, response to therapy seen including hyalin fibrosis and dystrophic calcifications.  9/18 lymph nodes positive, largest focus of metastatic carcinoma measures 1.2 cm with extranodal extension.  Right breast lumpectomy shows atypical lobular hyperplasia.   Social/family history: - She lives at home with her husband and daughter.  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  Current active smoker, half pack per day since age 72. - Mother had cervical cancer and bladder cancer.  Maternal grandmother had ovarian cancer.  Brother died at age 73 from acute leukemia.  Another brother died of liver cancer.  Maternal half aunt had breast cancer.   PLAN:  Oligometastatic (two 7 mm lung lesions) HER2 positive left breast cancer: -She is tolerating anastrozole very well. - She is tolerating Herceptin and pertuzumab very well. - CT CAP (06/17/2022): Pulmonary nodules or stable in size.  Posterior right upper lobe 4 mm similar.  Right lower lobe subpleural 5 mm nodule 4 mm previously.  No evidence of metastatic disease. - Bone scan is negative for metastatic disease. - Recommend continuing Herceptin and pertuzumab.  Labs today shows normal LFTs with mildly elevated alk phos.  CBC was normal. - As she has gotten very good response with stable lung lesions, I have recommended to talk to Dr. Lynnette Caffey for consideration of radiation therapy to the left chest wall and axillary area. - Continue Herceptin and pertuzumab every 3 weeks.  RTC 6 weeks. - We will obtain 2D echocardiogram prior to next visit.  2.  Right facial shingles/postherpetic right hemifacial pain: - Continue gabapentin 300 mg 3 times daily.  3.  Hypomagnesemia: - She was not taking magnesium due to nausea.  She will restart magnesium 3 times daily.  Magnesium today is 1.3.  She will receive 4  g of IV magnesium.  4.  Anxiety: - Continue Xanax 0.25 mg daily.  5.  Hypertension: - Continue amlodipine.  Blood pressure is 122/71.  6.  Left leg DVT (diagnosed 03/04/2022): - Continue Eliquis twice daily.  No bleeding issues.  7.  Osteopenia (DEXA 04/19/2022 T score -2.1): - Continue calcium and vitamin D supplements.          Orders placed this encounter:  No orders of the defined types were placed in this encounter.     Derek Jack, MD [facility] [phone]

## 2022-06-24 NOTE — Progress Notes (Signed)
Patient presents today for Kajitinti and Perjeta, patient okay for treatment per Dr. Delton Coombes with additional orders received for 4g of Magnesium. Patient reports taking tylenol around 0930.  Patient tolerated chemotherapy and magnesium with no complaints voiced. Side effects with management reviewed understanding verbalized. Port site clean and dry with no bruising or swelling noted at site. Good blood return noted before and after administration of chemotherapy. Band aid applied. Patient left in satisfactory condition with VSS and no s/s of distress noted.

## 2022-06-24 NOTE — Progress Notes (Signed)
Patient has been examined by Dr. Katragadda, and vital signs and labs have been reviewed. ANC, Creatinine, LFTs, hemoglobin, and platelets are within treatment parameters per M.D. - pt may proceed with treatment.  Primary RN and pharmacy notified.  

## 2022-06-24 NOTE — Patient Instructions (Signed)
[facility]  Discharge Instructions: Thank you for choosing [facility] to provide your oncology and hematology care.  If you have a lab appointment with the [facility], please come in thru the Main Entrance and check in at the main information desk.  Wear comfortable clothing and clothing appropriate for easy access to any Portacath or PICC line.   We strive to give you quality time with your provider. You may need to reschedule your appointment if you arrive late (15 or more minutes).  Arriving late affects you and other patients whose appointments are after yours.  Also, if you miss three or more appointments without notifying the office, you may be dismissed from the clinic at the provider's discretion.      For prescription refill requests, have your pharmacy contact our office and allow 72 hours for refills to be completed.    Today you received the following chemotherapy and/or immunotherapy agents magnesium, Kanjinti and Perjeta, return as scheduled.    To help prevent nausea and vomiting after your treatment, we encourage you to take your nausea medication as directed.  BELOW ARE SYMPTOMS THAT SHOULD BE REPORTED IMMEDIATELY: *FEVER GREATER THAN 100.4 F (38 C) OR HIGHER *CHILLS OR SWEATING *NAUSEA AND VOMITING THAT IS NOT CONTROLLED WITH YOUR NAUSEA MEDICATION *UNUSUAL SHORTNESS OF BREATH *UNUSUAL BRUISING OR BLEEDING *URINARY PROBLEMS (pain or burning when urinating, or frequent urination) *BOWEL PROBLEMS (unusual diarrhea, constipation, pain near the anus) TENDERNESS IN MOUTH AND THROAT WITH OR WITHOUT PRESENCE OF ULCERS (sore throat, sores in mouth, or a toothache) UNUSUAL RASH, SWELLING OR PAIN  UNUSUAL VAGINAL DISCHARGE OR ITCHING   Items with * indicate a potential emergency and should be followed up as soon as possible or go to the Emergency Department if any problems should occur.  Please show the CHEMOTHERAPY ALERT CARD or  IMMUNOTHERAPY ALERT CARD at check-in to the Emergency Department and triage nurse.  Should you have questions after your visit or need to cancel or reschedule your appointment, please contact [facility] [phone]  and follow the prompts.  Office hours are 8:00 a.m. to 4:30 p.m. Monday - Friday. Please note that voicemails left after 4:00 p.m. may not be returned until the following business day.  We are closed weekends and major holidays. You have access to a nurse at all times for urgent questions. Please call the main number to the clinic [phone] and follow the prompts.  For any non-urgent questions, you may also contact your provider using MyChart. We now offer e-Visits for anyone 41 and older to request care online for non-urgent symptoms. For details visit [email or web address].   Also download the MyChart app! Go to the app store, search "MyChart", open the app, select [facility], and log in with your MyChart username and password.  Masks are optional in the cancer centers. If you would like for your care team to wear a mask while they are taking care of you, please let them know. You may have one support person who is at least 65 years old accompany you for your appointments.

## 2022-06-24 NOTE — Patient Instructions (Addendum)
[place] at [facility] Discharge Instructions   You were seen and examined today by Dr. Delton Coombes.  He reviewed the results of the CT scan which were fine. The lung spots are barely visible. He does not recommend radiation at this time. Your bone scan was also normal.   He reviewed the results of your lab work. Your magnesium is severely low. Try to restart magnesium tablets at home three times a day. We will send a new prescription.  We will proceed with your treatment today.   Return as scheduled.    Thank you for choosing [place] at [facility] to provide your oncology and hematology care.  To afford each patient quality time with our provider, please arrive at least 15 minutes before your scheduled appointment time.   If you have a lab appointment with the [facility] please come in thru the Main Entrance and check in at the main information desk.  You need to re-schedule your appointment should you arrive 10 or more minutes late.  We strive to give you quality time with our providers, and arriving late affects you and other patients whose appointments are after yours.  Also, if you no show three or more times for appointments you may be dismissed from the clinic at the providers discretion.     Again, thank you for choosing [facility].  Our hope is that these requests will decrease the amount of time that you wait before being seen by our physicians.       _____________________________________________________________  Should you have questions after your visit to [facility], please contact our office at [phone] and follow the prompts.  Our office hours are 8:00 a.m. and 4:30 p.m. Monday - Friday.  Please note that voicemails left after 4:00 p.m. may not be returned until the following business day.  We are closed weekends and major holidays.  You do have access to a nurse 24-7, just call the main  number to the clinic [phone] and do not press any options, hold on the line and a nurse will answer the phone.    For prescription refill requests, have your pharmacy contact our office and allow 72 hours.    Due to Covid, you will need to wear a mask upon entering the hospital. If you do not have a mask, a mask will be given to you at the Main Entrance upon arrival. For doctor visits, patients may have 1 support person age 40 or older with them. For treatment visits, patients can not have anyone with them due to social distancing guidelines and our immunocompromised population.

## 2022-06-24 NOTE — Progress Notes (Signed)
Patients port flushed without difficulty.  Good blood return noted with no bruising or swelling noted at site.  Stable during access and blood draw.  Patient to remain accessed for treatment. 

## 2022-06-25 ENCOUNTER — Other Ambulatory Visit: Payer: Self-pay

## 2022-06-25 DIAGNOSIS — C50919 Malignant neoplasm of unspecified site of unspecified female breast: Secondary | ICD-10-CM

## 2022-06-25 DIAGNOSIS — Z09 Encounter for follow-up examination after completed treatment for conditions other than malignant neoplasm: Secondary | ICD-10-CM

## 2022-06-25 LAB — CANCER ANTIGEN 27.29: CA 27.29: 26.3 U/mL (ref 0.0–38.6)

## 2022-06-25 NOTE — Progress Notes (Signed)
Message received from Dr. Delton Coombes- Please order 2D echocardiogram, next available appointment for surveillance of EF while on Herceptin. Thanks.   Order placed per Dr. Tomie China order.

## 2022-06-26 LAB — CANCER ANTIGEN 15-3: CA 15-3: 26.8 U/mL — ABNORMAL HIGH (ref 0.0–25.0)

## 2022-06-28 ENCOUNTER — Encounter: Payer: Self-pay | Admitting: Hematology

## 2022-06-28 ENCOUNTER — Encounter (HOSPITAL_COMMUNITY): Payer: Self-pay | Admitting: Hematology

## 2022-06-29 ENCOUNTER — Other Ambulatory Visit: Payer: Self-pay

## 2022-06-30 ENCOUNTER — Other Ambulatory Visit (HOSPITAL_COMMUNITY): Payer: Managed Care, Other (non HMO)

## 2022-07-12 ENCOUNTER — Ambulatory Visit (HOSPITAL_COMMUNITY)
Admission: RE | Admit: 2022-07-12 | Discharge: 2022-07-12 | Disposition: A | Discharge status: Home / Self Care | Payer: Managed Care, Other (non HMO) | Source: Ambulatory Visit | Attending: Hematology | Admitting: Hematology

## 2022-07-12 DIAGNOSIS — Z09 Encounter for follow-up examination after completed treatment for conditions other than malignant neoplasm: Secondary | ICD-10-CM | POA: Insufficient documentation

## 2022-07-12 DIAGNOSIS — C50919 Malignant neoplasm of unspecified site of unspecified female breast: Secondary | ICD-10-CM | POA: Diagnosis present

## 2022-07-12 DIAGNOSIS — Z0189 Encounter for other specified special examinations: Secondary | ICD-10-CM

## 2022-07-12 LAB — ECHOCARDIOGRAM COMPLETE
Area-P 1/2: 3.91 cm2
Calc EF: 58.9 %
S' Lateral: 2.6 cm
Single Plane A2C EF: 58.5 %
Single Plane A4C EF: 60.4 %

## 2022-07-12 NOTE — Progress Notes (Signed)
*  PRELIMINARY RESULTS* Echocardiogram 2D Echocardiogram has been performed.  Traci Fuller 07/12/2022, 11:14 AM

## 2022-07-13 ENCOUNTER — Other Ambulatory Visit: Payer: Self-pay

## 2022-07-20 ENCOUNTER — Other Ambulatory Visit: Payer: Self-pay

## 2022-07-22 ENCOUNTER — Ambulatory Visit: Payer: Managed Care, Other (non HMO) | Admitting: Hematology

## 2022-07-22 ENCOUNTER — Other Ambulatory Visit: Payer: Self-pay

## 2022-07-22 ENCOUNTER — Inpatient Hospital Stay: Payer: Managed Care, Other (non HMO)

## 2022-07-22 ENCOUNTER — Other Ambulatory Visit: Payer: Managed Care, Other (non HMO)

## 2022-07-22 VITALS — BP 149/82 | HR 64 | Temp 97.6°F | Resp 16

## 2022-07-22 DIAGNOSIS — Z170 Estrogen receptor positive status [ER+]: Secondary | ICD-10-CM

## 2022-07-22 DIAGNOSIS — C50812 Malignant neoplasm of overlapping sites of left female breast: Secondary | ICD-10-CM | POA: Diagnosis not present

## 2022-07-22 DIAGNOSIS — Z95828 Presence of other vascular implants and grafts: Secondary | ICD-10-CM

## 2022-07-22 LAB — COMPREHENSIVE METABOLIC PANEL
ALT: 14 U/L (ref 0–44)
AST: 15 U/L (ref 15–41)
Albumin: 3.7 g/dL (ref 3.5–5.0)
Alkaline Phosphatase: 163 U/L — ABNORMAL HIGH (ref 38–126)
Anion gap: 8 (ref 5–15)
BUN: 32 mg/dL — ABNORMAL HIGH (ref 8–23)
CO2: 24 mmol/L (ref 22–32)
Calcium: 9.9 mg/dL (ref 8.9–10.3)
Chloride: 107 mmol/L (ref 98–111)
Creatinine, Ser: 1.44 mg/dL — ABNORMAL HIGH (ref 0.44–1.00)
GFR, Estimated: 40 mL/min — ABNORMAL LOW (ref >60)
Glucose, Bld: 93 mg/dL (ref 70–99)
Potassium: 4.2 mmol/L (ref 3.5–5.1)
Sodium: 139 mmol/L (ref 135–145)
Total Bilirubin: 0.4 mg/dL (ref 0.3–1.2)
Total Protein: 7.3 g/dL (ref 6.5–8.1)

## 2022-07-22 LAB — CBC WITH DIFFERENTIAL/PLATELET
Abs Immature Granulocytes: 0.02 10*3/uL (ref 0.00–0.07)
Basophils Absolute: 0 10*3/uL (ref 0.0–0.1)
Basophils Relative: 1 %
Eosinophils Absolute: 0.3 10*3/uL (ref 0.0–0.5)
Eosinophils Relative: 5 %
HCT: 35.5 % — ABNORMAL LOW (ref 36.0–46.0)
Hemoglobin: 11.2 g/dL — ABNORMAL LOW (ref 12.0–15.0)
Immature Granulocytes: 0 %
Lymphocytes Relative: 25 %
Lymphs Abs: 1.5 10*3/uL (ref 0.7–4.0)
MCH: 30 pg (ref 26.0–34.0)
MCHC: 31.5 g/dL (ref 30.0–36.0)
MCV: 95.2 fL (ref 80.0–100.0)
Monocytes Absolute: 0.4 10*3/uL (ref 0.1–1.0)
Monocytes Relative: 7 %
Neutro Abs: 3.7 10*3/uL (ref 1.7–7.7)
Neutrophils Relative %: 62 %
Platelets: 215 10*3/uL (ref 150–400)
RBC: 3.73 MIL/uL — ABNORMAL LOW (ref 3.87–5.11)
RDW: 15.1 % (ref 11.5–15.5)
WBC: 5.9 10*3/uL (ref 4.0–10.5)
nRBC: 0 % (ref 0.0–0.2)

## 2022-07-22 LAB — MAGNESIUM: Magnesium: 1.7 mg/dL (ref 1.7–2.4)

## 2022-07-22 MED ORDER — HEPARIN SOD (PORK) LOCK FLUSH 100 UNIT/ML IV SOLN
500.0000 [IU] | Freq: Once | INTRAVENOUS | Status: AC | PRN
  Administered 2022-07-22: 500 [IU]

## 2022-07-22 MED ORDER — DIPHENHYDRAMINE HCL 25 MG PO CAPS
25.0000 mg | ORAL_CAPSULE | Freq: Once | ORAL | Status: AC
  Administered 2022-07-22: 25 mg via ORAL
  Filled 2022-07-22: qty 1

## 2022-07-22 MED ORDER — SODIUM CHLORIDE 0.9 % IV SOLN: Freq: Once | INTRAVENOUS | Status: AC

## 2022-07-22 MED ORDER — TRASTUZUMAB-ANNS CHEMO 150 MG IV SOLR
6.0000 mg/kg | Freq: Once | INTRAVENOUS | Status: AC
  Administered 2022-07-22: 399 mg via INTRAVENOUS
  Filled 2022-07-22: qty 19

## 2022-07-22 MED ORDER — ACETAMINOPHEN 325 MG PO TABS
650.0000 mg | ORAL_TABLET | Freq: Once | ORAL | Status: AC
  Administered 2022-07-22: 650 mg via ORAL
  Filled 2022-07-22: qty 2

## 2022-07-22 MED ORDER — SODIUM CHLORIDE 0.9 % IV SOLN
420.0000 mg | Freq: Once | INTRAVENOUS | Status: AC
  Administered 2022-07-22: 420 mg via INTRAVENOUS
  Filled 2022-07-22: qty 14

## 2022-07-22 MED ORDER — SODIUM CHLORIDE 0.9% FLUSH
10.0000 mL | Freq: Once | INTRAVENOUS | Status: AC
  Administered 2022-07-22: 10 mL via INTRAVENOUS

## 2022-07-22 MED ORDER — SODIUM CHLORIDE 0.9% FLUSH
10.0000 mL | INTRAVENOUS | Status: DC | PRN
  Administered 2022-07-22: 10 mL

## 2022-07-22 NOTE — Progress Notes (Signed)
Pt presents today for Kanjinit and Perjeta per provider's order. Vital signs and labs WNL for treatment.Okay to proceed with treatment today.  Treatment given today per MD orders. Tolerated infusion without adverse affects. Vital signs stable. No complaints at this time. Discharged from clinic ambulatory in stable condition. Alert and oriented x 3. F/U with [facility] as scheduled.

## 2022-07-22 NOTE — Patient Instructions (Signed)
[place]  Discharge Instructions: Thank you for choosing [facility] to provide your oncology and hematology care.  If you have a lab appointment with the [facility], please come in thru the Main Entrance and check in at the main information desk.  Wear comfortable clothing and clothing appropriate for easy access to any Portacath or PICC line.   We strive to give you quality time with your provider. You may need to reschedule your appointment if you arrive late (15 or more minutes).  Arriving late affects you and other patients whose appointments are after yours.  Also, if you miss three or more appointments without notifying the office, you may be dismissed from the clinic at the provider's discretion.      For prescription refill requests, have your pharmacy contact our office and allow 72 hours for refills to be completed.    Today you received the following chemotherapy and/or immunotherapy agents Kanjinti and Perjeta   To help prevent nausea and vomiting after your treatment, we encourage you to take your nausea medication as directed.  Trastuzumab Injection What is this medication? TRASTUZUMAB (tras TOO zoo mab) treats breast cancer and stomach cancer. It works by blocking a protein that causes cancer cells to grow and multiply. This helps to slow or stop the spread of cancer cells. This medicine may be used for other purposes; ask your health care provider or pharmacist if you have questions. COMMON BRAND NAME(S): Herceptin, Janae Bridgeman, Ontruzant, Trazimera What should I tell my care team before I take this medication? They need to know if you have any of these conditions: Heart failure Lung disease An unusual or allergic reaction to trastuzumab, other medications, foods, dyes, or preservatives Pregnant or trying to get pregnant Breast-feeding How should I use this medication? This medication is injected into a vein. It is  given by your care team in a hospital or clinic setting. Talk to your care team about the use of this medication in children. It is not approved for use in children. Overdosage: If you think you have taken too much of this medicine contact a poison control center or emergency room at once. NOTE: This medicine is only for you. Do not share this medicine with others. What if I miss a dose? Keep appointments for follow-up doses. It is important not to miss your dose. Call your care team if you are unable to keep an appointment. What may interact with this medication? Certain types of chemotherapy, such as daunorubicin, doxorubicin, epirubicin, idarubicin This list may not describe all possible interactions. Give your health care provider a list of all the medicines, herbs, non-prescription drugs, or dietary supplements you use. Also tell them if you smoke, drink alcohol, or use illegal drugs. Some items may interact with your medicine. What should I watch for while using this medication? Your condition will be monitored carefully while you are receiving this medication. This medication may make you feel generally unwell. This is not uncommon, as chemotherapy affects healthy cells as well as cancer cells. Report any side effects. Continue your course of treatment even though you feel ill unless your care team tells you to stop. This medication may increase your risk of getting an infection. Call your care team for advice if you get a fever, chills, sore throat, or other symptoms of a cold or flu. Do not treat yourself. Try to avoid being around people who are sick. Avoid taking medications that contain aspirin,  acetaminophen, ibuprofen, naproxen, or ketoprofen unless instructed by your care team. These medications can hide a fever. Talk to your care team if you may be pregnant. Serious birth defects can occur if you take this medication during pregnancy and for 7 months after the last dose. You will need a  negative pregnancy test before starting this medication. Contraception is recommended while taking this medication and for 7 months after the last dose. Your care team can help you find the option that works for you. Do not breastfeed while taking this medication and for 7 months after stopping treatment. What side effects may I notice from receiving this medication? Side effects that you should report to your care team as soon as possible: Allergic reactions or angioedema--skin rash, itching or hives, swelling of the face, eyes, lips, tongue, arms, or legs, trouble swallowing or breathing Dry cough, shortness of breath or trouble breathing Heart failure--shortness of breath, swelling of the ankles, feet, or hands, sudden weight gain, unusual weakness or fatigue Infection--fever, chills, cough, or sore throat Infusion reactions--chest pain, shortness of breath or trouble breathing, feeling faint or lightheaded Side effects that usually do not require medical attention (report to your care team if they continue or are bothersome): Diarrhea Dizziness Headache Nausea Trouble sleeping Vomiting This list may not describe all possible side effects. Call your doctor for medical advice about side effects. You may report side effects to FDA at 1-800-FDA-1088. Where should I keep my medication? This medication is given in a hospital or clinic. It will not be stored at home. NOTE: This sheet is a summary. It may not cover all possible information. If you have questions about this medicine, talk to your doctor, pharmacist, or health care provider.  2023 Elsevier/Gold Standard (2021-12-10 00:00:00)  Pertuzumab Injection What is this medication? PERTUZUMAB (per TOOZ ue mab) treats breast cancer. It works by blocking a protein that causes cancer cells to grow and multiply. This helps to slow or stop the spread of cancer cells. It is a monoclonal antibody. This medicine may be used for other purposes; ask  your health care provider or pharmacist if you have questions. COMMON BRAND NAME(S): PERJETA What should I tell my care team before I take this medication? They need to know if you have any of these conditions: Heart failure An unusual or allergic reaction to pertuzumab, other medications, foods, dyes, or preservatives Pregnant or trying to get pregnant Breast-feeding How should I use this medication? This medication is injected into a vein. It is given by your care team in a hospital or clinic setting. Talk to your care team about the use of this medication in children. Special care may be needed. Overdosage: If you think you have taken too much of this medicine contact a poison control center or emergency room at once. NOTE: This medicine is only for you. Do not share this medicine with others. What if I miss a dose? Keep appointments for follow-up doses. It is important not to miss your dose. Call your care team if you are unable to keep an appointment. What may interact with this medication? Interactions are not expected. This list may not describe all possible interactions. Give your health care provider a list of all the medicines, herbs, non-prescription drugs, or dietary supplements you use. Also tell them if you smoke, drink alcohol, or use illegal drugs. Some items may interact with your medicine. What should I watch for while using this medication? Your condition will be monitored carefully while  you are receiving this medication. This medication may make you feel generally unwell. This is not uncommon as chemotherapy can affect healthy cells as well as cancer cells. Report any side effects. Continue your course of treatment even though you feel ill unless your care team tells you to stop. Talk to your care team if you may be pregnant. Serious birth defects can occur if you take this medication during pregnancy and for 7 months after the last dose. You will need a negative pregnancy  test before starting this medication. Contraception is recommended while taking this medication and for 7 months after the last dose. Your care team can help you find the option that works for you. Do not breastfeed while taking this medication and for 7 months after the last dose. What side effects may I notice from receiving this medication? Side effects that you should report to your care team as soon as possible: Allergic reactions or angioedema--skin rash, itching or hives, swelling of the face, eyes, lips, tongue, arms, or legs, trouble swallowing or breathing Heart failure--shortness of breath, swelling of the ankles, feet, or hands, sudden weight gain, unusual weakness or fatigue Infusion reactions--chest pain, shortness of breath or trouble breathing, feeling faint or lightheaded Side effects that usually do not require medical attention (report to your care team if they continue or are bothersome): Diarrhea Dry skin Fatigue Hair loss Nausea Vomiting This list may not describe all possible side effects. Call your doctor for medical advice about side effects. You may report side effects to FDA at 1-800-FDA-1088. Where should I keep my medication? This medication is given in a hospital or clinic. It will not be stored at home. NOTE: This sheet is a summary. It may not cover all possible information. If you have questions about this medicine, talk to your doctor, pharmacist, or health care provider.  2023 Elsevier/Gold Standard (2021-12-22 00:00:00)   BELOW ARE SYMPTOMS THAT SHOULD BE REPORTED IMMEDIATELY: *FEVER GREATER THAN 100.4 F (38 C) OR HIGHER *CHILLS OR SWEATING *NAUSEA AND VOMITING THAT IS NOT CONTROLLED WITH YOUR NAUSEA MEDICATION *UNUSUAL SHORTNESS OF BREATH *UNUSUAL BRUISING OR BLEEDING *URINARY PROBLEMS (pain or burning when urinating, or frequent urination) *BOWEL PROBLEMS (unusual diarrhea, constipation, pain near the anus) TENDERNESS IN MOUTH AND THROAT WITH OR  WITHOUT PRESENCE OF ULCERS (sore throat, sores in mouth, or a toothache) UNUSUAL RASH, SWELLING OR PAIN  UNUSUAL VAGINAL DISCHARGE OR ITCHING   Items with * indicate a potential emergency and should be followed up as soon as possible or go to the Emergency Department if any problems should occur.  Please show the CHEMOTHERAPY ALERT CARD or IMMUNOTHERAPY ALERT CARD at check-in to the Emergency Department and triage nurse.  Should you have questions after your visit or need to cancel or reschedule your appointment, please contact [facility] [phone]  and follow the prompts.  Office hours are 8:00 a.m. to 4:30 p.m. Monday - Friday. Please note that voicemails left after 4:00 p.m. may not be returned until the following business day.  We are closed weekends and major holidays. You have access to a nurse at all times for urgent questions. Please call the main number to the clinic [phone] and follow the prompts.  For any non-urgent questions, you may also contact your provider using MyChart. We now offer e-Visits for anyone 78 and older to request care online for non-urgent symptoms. For details visit [email or web address].   Also download the MyChart app! Go to the app store,  search "MyChart", open the app, select [facility], and log in with your MyChart username and password.  Masks are optional in the cancer centers. If you would like for your care team to wear a mask while they are taking care of you, please let them know. You may have one support person who is at least 65 years old accompany you for your appointments.

## 2022-07-26 ENCOUNTER — Other Ambulatory Visit: Payer: Self-pay

## 2022-08-05 ENCOUNTER — Encounter: Payer: Self-pay | Admitting: Hematology

## 2022-08-05 ENCOUNTER — Encounter (HOSPITAL_COMMUNITY): Payer: Self-pay | Admitting: Hematology

## 2022-08-11 ENCOUNTER — Encounter (HOSPITAL_COMMUNITY): Payer: Self-pay | Admitting: Hematology

## 2022-08-11 ENCOUNTER — Encounter: Payer: Self-pay | Admitting: Hematology

## 2022-08-12 ENCOUNTER — Inpatient Hospital Stay: Payer: Managed Care, Other (non HMO) | Attending: Oncology

## 2022-08-12 ENCOUNTER — Inpatient Hospital Stay (HOSPITAL_BASED_OUTPATIENT_CLINIC_OR_DEPARTMENT_OTHER): Payer: Managed Care, Other (non HMO) | Admitting: Hematology

## 2022-08-12 ENCOUNTER — Inpatient Hospital Stay: Payer: Managed Care, Other (non HMO)

## 2022-08-12 VITALS — BP 139/75 | HR 68 | Temp 97.2°F | Resp 17

## 2022-08-12 DIAGNOSIS — Z5111 Encounter for antineoplastic chemotherapy: Secondary | ICD-10-CM | POA: Insufficient documentation

## 2022-08-12 DIAGNOSIS — Z79899 Other long term (current) drug therapy: Secondary | ICD-10-CM | POA: Insufficient documentation

## 2022-08-12 DIAGNOSIS — Z95828 Presence of other vascular implants and grafts: Secondary | ICD-10-CM

## 2022-08-12 DIAGNOSIS — Z170 Estrogen receptor positive status [ER+]: Secondary | ICD-10-CM | POA: Diagnosis not present

## 2022-08-12 DIAGNOSIS — C50812 Malignant neoplasm of overlapping sites of left female breast: Secondary | ICD-10-CM | POA: Diagnosis present

## 2022-08-12 DIAGNOSIS — Z79811 Long term (current) use of aromatase inhibitors: Secondary | ICD-10-CM | POA: Insufficient documentation

## 2022-08-12 LAB — CBC WITH DIFFERENTIAL/PLATELET
Abs Immature Granulocytes: 0.01 10*3/uL (ref 0.00–0.07)
Basophils Absolute: 0 10*3/uL (ref 0.0–0.1)
Basophils Relative: 1 %
Eosinophils Absolute: 0.2 10*3/uL (ref 0.0–0.5)
Eosinophils Relative: 4 %
HCT: 35.6 % — ABNORMAL LOW (ref 36.0–46.0)
Hemoglobin: 11.3 g/dL — ABNORMAL LOW (ref 12.0–15.0)
Immature Granulocytes: 0 %
Lymphocytes Relative: 23 %
Lymphs Abs: 1.3 10*3/uL (ref 0.7–4.0)
MCH: 30 pg (ref 26.0–34.0)
MCHC: 31.7 g/dL (ref 30.0–36.0)
MCV: 94.4 fL (ref 80.0–100.0)
Monocytes Absolute: 0.4 10*3/uL (ref 0.1–1.0)
Monocytes Relative: 7 %
Neutro Abs: 3.9 10*3/uL (ref 1.7–7.7)
Neutrophils Relative %: 65 %
Platelets: 195 10*3/uL (ref 150–400)
RBC: 3.77 MIL/uL — ABNORMAL LOW (ref 3.87–5.11)
RDW: 15.1 % (ref 11.5–15.5)
WBC: 5.8 10*3/uL (ref 4.0–10.5)
nRBC: 0 % (ref 0.0–0.2)

## 2022-08-12 LAB — COMPREHENSIVE METABOLIC PANEL
ALT: 15 U/L (ref 0–44)
AST: 15 U/L (ref 15–41)
Albumin: 3.8 g/dL (ref 3.5–5.0)
Alkaline Phosphatase: 152 U/L — ABNORMAL HIGH (ref 38–126)
Anion gap: 9 (ref 5–15)
BUN: 29 mg/dL — ABNORMAL HIGH (ref 8–23)
CO2: 23 mmol/L (ref 22–32)
Calcium: 10 mg/dL (ref 8.9–10.3)
Chloride: 106 mmol/L (ref 98–111)
Creatinine, Ser: 1.26 mg/dL — ABNORMAL HIGH (ref 0.44–1.00)
GFR, Estimated: 47 mL/min — ABNORMAL LOW (ref >60)
Glucose, Bld: 106 mg/dL — ABNORMAL HIGH (ref 70–99)
Potassium: 4 mmol/L (ref 3.5–5.1)
Sodium: 138 mmol/L (ref 135–145)
Total Bilirubin: 0.6 mg/dL (ref 0.3–1.2)
Total Protein: 7 g/dL (ref 6.5–8.1)

## 2022-08-12 LAB — MAGNESIUM: Magnesium: 1.6 mg/dL — ABNORMAL LOW (ref 1.7–2.4)

## 2022-08-12 MED ORDER — DIPHENHYDRAMINE HCL 25 MG PO CAPS
25.0000 mg | ORAL_CAPSULE | Freq: Once | ORAL | Status: AC
  Administered 2022-08-12: 25 mg via ORAL
  Filled 2022-08-12: qty 1

## 2022-08-12 MED ORDER — TRASTUZUMAB-ANNS CHEMO 150 MG IV SOLR
6.0000 mg/kg | Freq: Once | INTRAVENOUS | Status: AC
  Administered 2022-08-12: 399 mg via INTRAVENOUS
  Filled 2022-08-12: qty 19

## 2022-08-12 MED ORDER — SODIUM CHLORIDE 0.9% FLUSH
10.0000 mL | INTRAVENOUS | Status: DC | PRN
  Administered 2022-08-12: 10 mL

## 2022-08-12 MED ORDER — ACETAMINOPHEN 325 MG PO TABS
650.0000 mg | ORAL_TABLET | Freq: Once | ORAL | Status: AC
  Administered 2022-08-12: 650 mg via ORAL
  Filled 2022-08-12: qty 2

## 2022-08-12 MED ORDER — SODIUM CHLORIDE 0.9% FLUSH
10.0000 mL | Freq: Once | INTRAVENOUS | Status: AC
  Administered 2022-08-12: 10 mL via INTRAVENOUS

## 2022-08-12 MED ORDER — SODIUM CHLORIDE 0.9 % IV SOLN
420.0000 mg | Freq: Once | INTRAVENOUS | Status: AC
  Administered 2022-08-12: 420 mg via INTRAVENOUS
  Filled 2022-08-12: qty 14

## 2022-08-12 MED ORDER — SODIUM CHLORIDE 0.9 % IV SOLN: Freq: Once | INTRAVENOUS | Status: AC

## 2022-08-12 MED ORDER — HEPARIN SOD (PORK) LOCK FLUSH 100 UNIT/ML IV SOLN
500.0000 [IU] | Freq: Once | INTRAVENOUS | Status: AC | PRN
  Administered 2022-08-12: 500 [IU]

## 2022-08-12 NOTE — Progress Notes (Signed)
[facility] [address], [place] [address]   CLINIC:  Medical Oncology/Hematology  PCP:  Celene Squibb, MD [address] [name] [address] [phone]   REASON FOR VISIT:  Follow-up for locally advanced left breast cancer  PRIOR THERAPY: TCHP x 6 cycles.  NGS Results: not done  CURRENT THERAPY: Anastrozole daily, trastuzumab and Herceptin every 21 days. BRIEF ONCOLOGIC HISTORY:  Oncology History  Breast cancer, left ([facility])  10/15/2021 Initial Diagnosis   Breast cancer, left ([facility])   11/04/2021 - 04/22/2022 Chemotherapy   Patient is on Treatment Plan : BREAST  Docetaxel + Carboplatin + Trastuzumab + Pertuzumab  (TCHP) q21d       Genetic Testing   Negative genetic testing. No pathogenic variants identified on the [facility] Multi-Cancer+RNA panel. The report date is 11/02/2021.   The Multi-Cancer Panel + RNA offered by [facility] includes sequencing and/or deletion duplication testing of the following 84 genes: AIP, ALK, APC, ATM, AXIN2,BAP1,  BARD1, BLM, BMPR1A, BRCA1, BRCA2, BRIP1, CASR, CDC73, CDH1, CDK4, CDKN1B, CDKN1C, CDKN2A (p14ARF), CDKN2A (p16INK4a), CEBPA, CHEK2, CTNNA1, DICER1, DIS3L2, EGFR (c.2369C>T, p.Thr790Met variant only), EPCAM (Deletion/duplication testing only), FH, FLCN, GATA2, GPC3, GREM1 (Promoter region deletion/duplication testing only), HOXB13 (c.251G>A, p.Gly84Glu), HRAS, KIT, MAX, MEN1, MET, MITF (c.952G>A, p.Glu318Lys variant only), MLH1, MSH2, MSH3, MSH6, MUTYH, NBN, NF1, NF2, NTHL1, PALB2, PDGFRA, PHOX2B, PMS2, POLD1, POLE, POT1, PRKAR1A, PTCH1, PTEN, RAD50, RAD51C, RAD51D, RB1, RECQL4, RET, RUNX1, SDHAF2, SDHA (sequence changes only), SDHB, SDHC, SDHD, SMAD4, SMARCA4, SMARCB1, SMARCE1, STK11, SUFU, TERC, TERT, TMEM127, TP53, TSC1, TSC2, VHL, WRN and WT1.   11/04/2021 -  Chemotherapy   Patient is on Treatment Plan : BREAST  Docetaxel + Carboplatin + Trastuzumab + Pertuzumab  (TCHP) q21d / Trastuzumab + Pertuzumab q21d        CANCER STAGING:  Cancer Staging  Breast cancer, left ([facility]) Staging form: Breast, AJCC 8th Edition - Clinical stage from 10/15/2021: Stage IIA (cT3, cN1, cM0, G2, ER+, PR+, HER2+) - Unsigned   INTERVAL HISTORY:  Traci Fuller, a 66 y.o. female, seen for follow-up of HER2 positive left breast cancer.  She is tolerating Herceptin and Perjeta very well.  Energy levels are 70%.  She has some headaches from recent sinus infection.  She has some numbness in the fingertips also.  Denies any diarrhea.  Occasional hot flashes are reported.  She often forgets taking third pill of magnesium.  REVIEW OF SYSTEMS:  Review of Systems  Musculoskeletal:  Positive for back pain.  Neurological:  Negative for numbness.  All other systems reviewed and are negative.   PAST MEDICAL/SURGICAL HISTORY:  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 DES to midLAD, 75% stenosis of RCA not treated   Cancer [facility])    Current smoker    Essential hypertension    Family history of bladder cancer    Family history of breast cancer    Family history of leukemia    Family history of uterine cancer    Hyperlipidemia LDL goal <70    Myocardial infarction ([facility])    PONV (postoperative nausea and vomiting)    Port-A-Cath in place 10/29/2021   Past Surgical History:  Procedure Laterality Date   ABDOMINAL HYSTERECTOMY     BREAST BIOPSY Bilateral 10/16/2021   BREAST BIOPSY WITH RADIO FREQUENCY LOCALIZER Right 05/05/2022   Procedure: BREAST BIOPSY WITH RADIO FREQUENCY LOCALIZER;  Surgeon: Virl Cagey, MD;  Location: [facility];  Service: General;  Laterality: Right;   CORONARY STENT INTERVENTION N/A 01/10/2018   Procedure: CORONARY STENT INTERVENTION;  Surgeon: Jettie Booze, MD;  Location: [facility];  Service: Cardiovascular;  Laterality: N/A;   LEFT HEART CATH AND CORONARY ANGIOGRAPHY N/A 01/10/2018   Procedure: LEFT HEART CATH AND CORONARY  ANGIOGRAPHY;  Surgeon: Jettie Booze, MD;  Location: [facility];  Service: Cardiovascular;  Laterality: N/A;   MASTECTOMY MODIFIED RADICAL Left 05/05/2022   Procedure: MASTECTOMY MODIFIED RADICAL WITH RADIO FREQUENCY LOCALIZER;  Surgeon: Virl Cagey, MD;  Location: [facility];  Service: General;  Laterality: Left;   PORTACATH PLACEMENT Right 10/26/2021   Procedure: INSERTION PORT-A-CATH;  Surgeon: Virl Cagey, MD;  Location: [facility];  Service: General;  Laterality: Right;    SOCIAL HISTORY:  Social History   Socioeconomic History   Marital status: Married    Spouse name: Not on file   Number of children: Not on file   Years of education: Not on file   Highest education level: Not on file  Occupational History   Not on file  Tobacco Use   Smoking status: Every Day    Packs/day: 1.00    Years: 43.00    Total pack years: 43.00    Types: Cigarettes    Start date: 1976   Smokeless tobacco: Never  Vaping Use   Vaping Use: Never used  Substance and Sexual Activity   Alcohol use: Not Currently   Drug use: Never   Sexual activity: Yes  Other Topics Concern   Not on file  Social History Narrative   Not on file   Social Determinants of Health   Financial Resource Strain: Not on file  Food Insecurity: No Food Insecurity (05/05/2022)   Hunger Vital Sign    Worried About Running Out of Food in the Last Year: Never true    Ran Out of Food in the Last Year: Never true  Transportation Needs: No Transportation Needs (05/05/2022)   PRAPARE - Hydrologist (Medical): No    Lack of Transportation (Non-Medical): No  Physical Activity: Not on file  Stress: Not on file  Social Connections: Not on file  Intimate Partner Violence: Not At Risk (05/05/2022)   Humiliation, Afraid, Rape, and Kick questionnaire    Fear of Current or Ex-Partner: No    Emotionally Abused: No    Physically Abused: No    Sexually Abused: No    FAMILY HISTORY:   Family History  Problem Relation Age of Onset   Bladder Cancer Mother 71   Cervical cancer Mother        dx 32s, hysterectomy   Hypertension Father    Cirrhosis Father    Leukemia Brother 69   Liver cancer Brother 52   Breast cancer Maternal Aunt        dx 74s   Cancer Maternal Uncle        unk type d. 48   Cancer Paternal Aunt        unk type   Breast cancer Maternal Grandmother        dx 73s   Uterine cancer Maternal Grandmother     CURRENT MEDICATIONS:  Current Outpatient Medications  Medication Sig Dispense Refill   ALPRAZolam (XANAX) 0.25 MG tablet Take 1 tablet (0.25 mg total) by mouth 2 (two) times daily as needed for anxiety. 60 tablet 2   amLODipine (NORVASC) 10 MG tablet Take 10  mg by mouth daily.     anastrozole (ARIMIDEX) 1 MG tablet Take 1 tablet (1 mg total) by mouth daily. 90 tablet 4   apixaban (ELIQUIS) 5 MG TABS tablet Take 1 tablet (5 mg total) by mouth 2 (two) times daily. 60 tablet 5   aspirin EC 81 MG tablet Take 1 tablet (81 mg total) by mouth daily. 90 tablet 3   Calcium Carb-Cholecalciferol (CALCIUM 600/VITAMIN D3 PO) Take 1 tablet by mouth in the morning and at bedtime.     CARBOPLATIN IV Inject into the vein every 21 ( twenty-one) days.     DOCEtaxel (TAXOTERE IV) Inject into the vein every 21 ( twenty-one) days.     Evolocumab (REPATHA SURECLICK) 419 MG/ML SOAJ Inject 140 mg into the skin every 14 (fourteen) days. 2 mL 11   gabapentin (NEURONTIN) 300 MG capsule Take 300 mg by mouth 2 (two) times daily.     magnesium oxide (MAG-OX) 400 (240 Mg) MG tablet Take 1 tablet (400 mg total) by mouth in the morning, at noon, and at bedtime. 90 tablet 4   metoprolol tartrate (LOPRESSOR) 25 MG tablet Take 1 tablet (25 mg total) by mouth 2 (two) times daily. Please call and schedule overdue appointment with Dr. Gwenlyn Found for further refills . SECOND ATTEMPT! 30 tablet 0   omeprazole (PRILOSEC OTC) 20 MG tablet Take 20 mg by mouth daily.     ondansetron (ZOFRAN-ODT) 4  MG disintegrating tablet Take 1 tablet (4 mg total) by mouth every 6 (six) hours as needed for nausea. 20 tablet 0   oxyCODONE (OXY IR/ROXICODONE) 5 MG immediate release tablet Take 1-2 tablets (5-10 mg total) by mouth every 4 (four) hours as needed for severe pain or breakthrough pain. 30 tablet 0   pegfilgrastim-bmez (ZIEXTENZO) 6 MG/0.6ML injection Inject 6 mg into the skin every 21 ( twenty-one) days. AFTER chemotherapy.     Pertuzumab (PERJETA IV) Inject into the vein every 21 ( twenty-one) days.     trastuzumab-anns (KANJINTI) 150 MG SOLR injection Inject 462 mg into the vein every 21 ( twenty-one) days.     No current facility-administered medications for this visit.    ALLERGIES:  No Known Allergies  PHYSICAL EXAM:  Performance status (ECOG): 1 - Symptomatic but completely ambulatory  There were no vitals filed for this visit. Wt Readings from Last 3 Encounters:  08/12/22 152 lb 12.8 oz (69.3 kg)  07/22/22 151 lb (68.5 kg)  06/24/22 150 lb 9.6 oz (68.3 kg)   Physical Exam Vitals reviewed.  Constitutional:      Appearance: Normal appearance.  Cardiovascular:     Rate and Rhythm: Normal rate and regular rhythm.     Pulses: Normal pulses.     Heart sounds: Normal heart sounds.  Pulmonary:     Effort: Pulmonary effort is normal.     Breath sounds: Normal breath sounds.  Neurological:     General: No focal deficit present.     Mental Status: She is alert and oriented to person, place, and time.  Psychiatric:        Mood and Affect: Mood normal.        Behavior: Behavior normal.    LABORATORY DATA:  I have reviewed the labs as listed.     Latest Ref Rng & Units 07/22/2022   10:15 AM 06/24/2022    9:27 AM 06/03/2022    9:39 AM  CBC  WBC 4.0 - 10.5 K/uL 5.9  7.0  6.5   Hemoglobin  12.0 - 15.0 g/dL 11.2  11.1  10.9   Hematocrit 36.0 - 46.0 % 35.5  34.2  33.8   Platelets 150 - 400 K/uL 215  186  186       Latest Ref Rng & Units 07/22/2022   10:15 AM 06/24/2022     9:27 AM 06/03/2022    9:39 AM  CMP  Glucose 70 - 99 mg/dL 93  101  96   BUN 8 - 23 mg/dL 32  32  33   Creatinine 0.44 - 1.00 mg/dL 1.44  1.35  1.10   Sodium 135 - 145 mmol/L 139  139  140   Potassium 3.5 - 5.1 mmol/L 4.2  4.4  4.2   Chloride 98 - 111 mmol/L 107  108  110   CO2 22 - 32 mmol/L _0 Calcium 8.9 - 10.3 mg/dL 9.9  9.5  9.6   Total Protein 6.5 - 8.1 g/dL 7.3  6.9  6.6   Total Bilirubin 0.3 - 1.2 mg/dL 0.4  0.6  0.5   Alkaline Phos 38 - 126 U/L 163  157  140   AST 15 - 41 U/L _1 ALT 0 - 44 U/L _2 DIAGNOSTIC IMAGING:  I have independently reviewed the scans and discussed with the patient. No results found.   ASSESSMENT:  Locally advanced HER2 positive left breast cancer: - Patient felt lump over the last 1 year. - She noticed lump has gotten bigger and deformed since September 2022.  She has itching over the skin of the breast. - Left breast 4:00 biopsy: Invasive mammary carcinoma, grade 2, ER 100%, PR 70%, Ki-67 25%, HER2 FISH positive - Left breast 2:00 biopsy: Invasive ductal carcinoma, grade 2, ER 100%, PR 70%, Ki-67 25%, HER2 negative - Left axillary lymph node biopsy: IDC, ER 100%, PR 50%, HER2 negative, E-cadherin positive - Bilateral breast MRI on 10/09/2021: Multiple enhancing masses with intervening non-mass enhancement involving most of the LOQ and part of the UOQ.  Abnormal enhancement spans approximately 12.4 x 6.4 x 7 cm.  Diffuse skin thickening/edema.  Multiple left axillary lymph nodes, largest node measuring approximately 2.4 cm in the lower axilla.  Indeterminate non-mass enhancement inferior to the biopsy site in the subareolar right breast measuring 1.5 x 0.9 x 0.8 cm. - Right breast biopsy UOQ on 10/01/2021: Fibroadenomatoid nodule with calcifications, fibrocystic changes including apocrine metaplasia with no malignancy. -PET scan from 10/22/2021: Large left breast mass and numerous hypermetabolic left axillary and subpectoral  lymph nodes.  2 hypermetabolic left upper lobe lung nodules measuring 7 mm with SUV 4.2 and 3.2 respectively.  No other hypermetabolic lung nodules.  No distant metastatic disease.  Nonspecific low-level subareolar activity in the right breast near the surgical clips and a single mildly hypermetabolic right axillary lymph node. - Echocardiogram shows EF 60 to 65%. - She has oligometastatic disease with 2 small lung nodules.  Biopsy is not feasible.  I have recommended TCHP regimen followed by restaging after 3 cycles. -6 cycles of dose reduced TCHP from 11/04/2021 through 03/11/2022 - Maintenance Herceptin and pertuzumab started on 04/01/2022, anastrozole started on 04/01/2022 -Left mastectomy and lymph node dissection (05/05/2022): [id number], residual IDC, grade 2, discontinuously involving fibrotic area measuring 5.9 cm, margins negative, LVI positive, response to therapy seen including hyalin fibrosis and dystrophic calcifications.  9/18 lymph nodes positive, largest focus of metastatic carcinoma measures 1.2  cm with extranodal extension.  Right breast lumpectomy shows atypical lobular hyperplasia. - We discussed switching to Kadcyla in the adjuvant setting.  Related to efficacy of Kadcyla compared with pertuzumab and trastuzumab in the adjuvant setting is unknown.  Patient is reluctant to consider switching as she had poor tolerance with the prior chemotherapy.  Hence we have continued Herceptin and Perjeta.   Social/family history: - She lives at home with her husband and daughter.  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  Current active smoker, half pack per day since age 89. - Mother had cervical cancer and bladder cancer.  Maternal grandmother had ovarian cancer.  Brother died at age 74 from acute leukemia.  Another brother died of liver cancer.  Maternal half aunt had breast cancer.   PLAN:  Oligometastatic (two 7 mm lung lesions) HER2 positive left breast cancer: - CT CAP on  06/17/2022: Pulmonary nodules are stable.  No evidence of metastatic disease.  Minimal right adrenal nodularity is unchanged most likely benign.  Presacral soft tissue and fat density process similar. - Reviewed labs today which showed normal LFTs and CBC.  Last CA 15-3 was 26.8 and downtrending. - Proceed with Herceptin and pertuzumab today and in 3 weeks.  RTC 6 weeks with repeat tumor markers. - Last echo was on 07/12/2022.  Will plan to repeat echo in 3 months. - She has follow-up with XRT on 08/30/2021 to start.   2.  Right facial shingles/postherpetic right hemifacial pain: - She has occasional numbness in the fingertips.  Continue gabapentin 300 mg 3 times daily.  3.  Hypomagnesemia: - She is taking magnesium 3 times daily but often forgets the last pill. - Magnesium is low at 1.6 today.  She was told to take magnesium 2 tablets in the morning and 1 tablet at night.  4.  Anxiety: - Continue Xanax 0.25 mg daily.  5.  Hypertension: - Continue amlodipine daily.  Blood pressure is well-controlled.  6.  Left leg DVT (diagnosed 03/04/2022): - Continue Eliquis twice daily.  No bleeding issues.  7.  Osteopenia (DEXA 04/19/2022 T score -2.1): - Continue calcium and vitamin D supplements.          Orders placed this encounter:  No orders of the defined types were placed in this encounter.     Derek Jack, MD [place] [phone]

## 2022-08-12 NOTE — Patient Instructions (Signed)
[facility]  Discharge Instructions: Thank you for choosing [facility] to provide your oncology and hematology care.  If you have a lab appointment with the [facility], please come in thru the Main Entrance and check in at the main information desk.  Wear comfortable clothing and clothing appropriate for easy access to any Portacath or PICC line.   We strive to give you quality time with your provider. You may need to reschedule your appointment if you arrive late (15 or more minutes).  Arriving late affects you and other patients whose appointments are after yours.  Also, if you miss three or more appointments without notifying the office, you may be dismissed from the clinic at the provider's discretion.      For prescription refill requests, have your pharmacy contact our office and allow 72 hours for refills to be completed.    Today you received the following chemotherapy and/or immunotherapy agents Kanjinti, Perjeta. Pertuzumab Injection What is this medication? PERTUZUMAB (per TOOZ ue mab) treats breast cancer. It works by blocking a protein that causes cancer cells to grow and multiply. This helps to slow or stop the spread of cancer cells. It is a monoclonal antibody. This medicine may be used for other purposes; ask your health care provider or pharmacist if you have questions. COMMON BRAND NAME(S): PERJETA What should I tell my care team before I take this medication? They need to know if you have any of these conditions: Heart failure An unusual or allergic reaction to pertuzumab, other medications, foods, dyes, or preservatives Pregnant or trying to get pregnant Breast-feeding How should I use this medication? This medication is injected into a vein. It is given by your care team in a hospital or clinic setting. Talk to your care team about the use of this medication in children. Special care may be needed. Overdosage: If you think you  have taken too much of this medicine contact a poison control center or emergency room at once. NOTE: This medicine is only for you. Do not share this medicine with others. What if I miss a dose? Keep appointments for follow-up doses. It is important not to miss your dose. Call your care team if you are unable to keep an appointment. What may interact with this medication? Interactions are not expected. This list may not describe all possible interactions. Give your health care provider a list of all the medicines, herbs, non-prescription drugs, or dietary supplements you use. Also tell them if you smoke, drink alcohol, or use illegal drugs. Some items may interact with your medicine. What should I watch for while using this medication? Your condition will be monitored carefully while you are receiving this medication. This medication may make you feel generally unwell. This is not uncommon as chemotherapy can affect healthy cells as well as cancer cells. Report any side effects. Continue your course of treatment even though you feel ill unless your care team tells you to stop. Talk to your care team if you may be pregnant. Serious birth defects can occur if you take this medication during pregnancy and for 7 months after the last dose. You will need a negative pregnancy test before starting this medication. Contraception is recommended while taking this medication and for 7 months after the last dose. Your care team can help you find the option that works for you. Do not breastfeed while taking this medication and for 7 months after the last dose. What side effects  may I notice from receiving this medication? Side effects that you should report to your care team as soon as possible: Allergic reactions or angioedema--skin rash, itching or hives, swelling of the face, eyes, lips, tongue, arms, or legs, trouble swallowing or breathing Heart failure--shortness of breath, swelling of the ankles, feet, or  hands, sudden weight gain, unusual weakness or fatigue Infusion reactions--chest pain, shortness of breath or trouble breathing, feeling faint or lightheaded Side effects that usually do not require medical attention (report to your care team if they continue or are bothersome): Diarrhea Dry skin Fatigue Hair loss Nausea Vomiting This list may not describe all possible side effects. Call your doctor for medical advice about side effects. You may report side effects to FDA at 1-800-FDA-1088. Where should I keep my medication? This medication is given in a hospital or clinic. It will not be stored at home. NOTE: This sheet is a summary. It may not cover all possible information. If you have questions about this medicine, talk to your doctor, pharmacist, or health care provider.  2023 Elsevier/Gold Standard (2021-12-22 00:00:00) Trastuzumab Injection What is this medication? TRASTUZUMAB (tras TOO zoo mab) treats breast cancer and stomach cancer. It works by blocking a protein that causes cancer cells to grow and multiply. This helps to slow or stop the spread of cancer cells. This medicine may be used for other purposes; ask your health care provider or pharmacist if you have questions. COMMON BRAND NAME(S): Herceptin, Janae Bridgeman, Ontruzant, Trazimera What should I tell my care team before I take this medication? They need to know if you have any of these conditions: Heart failure Lung disease An unusual or allergic reaction to trastuzumab, other medications, foods, dyes, or preservatives Pregnant or trying to get pregnant Breast-feeding How should I use this medication? This medication is injected into a vein. It is given by your care team in a hospital or clinic setting. Talk to your care team about the use of this medication in children. It is not approved for use in children. Overdosage: If you think you have taken too much of this medicine contact a poison control  center or emergency room at once. NOTE: This medicine is only for you. Do not share this medicine with others. What if I miss a dose? Keep appointments for follow-up doses. It is important not to miss your dose. Call your care team if you are unable to keep an appointment. What may interact with this medication? Certain types of chemotherapy, such as daunorubicin, doxorubicin, epirubicin, idarubicin This list may not describe all possible interactions. Give your health care provider a list of all the medicines, herbs, non-prescription drugs, or dietary supplements you use. Also tell them if you smoke, drink alcohol, or use illegal drugs. Some items may interact with your medicine. What should I watch for while using this medication? Your condition will be monitored carefully while you are receiving this medication. This medication may make you feel generally unwell. This is not uncommon, as chemotherapy affects healthy cells as well as cancer cells. Report any side effects. Continue your course of treatment even though you feel ill unless your care team tells you to stop. This medication may increase your risk of getting an infection. Call your care team for advice if you get a fever, chills, sore throat, or other symptoms of a cold or flu. Do not treat yourself. Try to avoid being around people who are sick. Avoid taking medications that contain aspirin, acetaminophen, ibuprofen, naproxen,  or ketoprofen unless instructed by your care team. These medications can hide a fever. Talk to your care team if you may be pregnant. Serious birth defects can occur if you take this medication during pregnancy and for 7 months after the last dose. You will need a negative pregnancy test before starting this medication. Contraception is recommended while taking this medication and for 7 months after the last dose. Your care team can help you find the option that works for you. Do not breastfeed while taking this  medication and for 7 months after stopping treatment. What side effects may I notice from receiving this medication? Side effects that you should report to your care team as soon as possible: Allergic reactions or angioedema--skin rash, itching or hives, swelling of the face, eyes, lips, tongue, arms, or legs, trouble swallowing or breathing Dry cough, shortness of breath or trouble breathing Heart failure--shortness of breath, swelling of the ankles, feet, or hands, sudden weight gain, unusual weakness or fatigue Infection--fever, chills, cough, or sore throat Infusion reactions--chest pain, shortness of breath or trouble breathing, feeling faint or lightheaded Side effects that usually do not require medical attention (report to your care team if they continue or are bothersome): Diarrhea Dizziness Headache Nausea Trouble sleeping Vomiting This list may not describe all possible side effects. Call your doctor for medical advice about side effects. You may report side effects to FDA at 1-800-FDA-1088. Where should I keep my medication? This medication is given in a hospital or clinic. It will not be stored at home. NOTE: This sheet is a summary. It may not cover all possible information. If you have questions about this medicine, talk to your doctor, pharmacist, or health care provider.  2023 Elsevier/Gold Standard (2021-12-10 00:00:00)       To help prevent nausea and vomiting after your treatment, we encourage you to take your nausea medication as directed.  BELOW ARE SYMPTOMS THAT SHOULD BE REPORTED IMMEDIATELY: *FEVER GREATER THAN 100.4 F (38 C) OR HIGHER *CHILLS OR SWEATING *NAUSEA AND VOMITING THAT IS NOT CONTROLLED WITH YOUR NAUSEA MEDICATION *UNUSUAL SHORTNESS OF BREATH *UNUSUAL BRUISING OR BLEEDING *URINARY PROBLEMS (pain or burning when urinating, or frequent urination) *BOWEL PROBLEMS (unusual diarrhea, constipation, pain near the anus) TENDERNESS IN MOUTH AND THROAT  WITH OR WITHOUT PRESENCE OF ULCERS (sore throat, sores in mouth, or a toothache) UNUSUAL RASH, SWELLING OR PAIN  UNUSUAL VAGINAL DISCHARGE OR ITCHING   Items with * indicate a potential emergency and should be followed up as soon as possible or go to the Emergency Department if any problems should occur.  Please show the CHEMOTHERAPY ALERT CARD or IMMUNOTHERAPY ALERT CARD at check-in to the Emergency Department and triage nurse.  Should you have questions after your visit or need to cancel or reschedule your appointment, please contact [facility] [phone]  and follow the prompts.  Office hours are 8:00 a.m. to 4:30 p.m. Monday - Friday. Please note that voicemails left after 4:00 p.m. may not be returned until the following business day.  We are closed weekends and major holidays. You have access to a nurse at all times for urgent questions. Please call the main number to the clinic [phone] and follow the prompts.  For any non-urgent questions, you may also contact your provider using MyChart. We now offer e-Visits for anyone 56 and older to request care online for non-urgent symptoms. For details visit [email or web address].   Also download the MyChart app! Go to the app store,  search "MyChart", open the app, select [facility], and log in with your MyChart username and password.  Masks are optional in the cancer centers. If you would like for your care team to wear a mask while they are taking care of you, please let them know. You may have one support person who is at least 65 years old accompany you for your appointments.

## 2022-08-12 NOTE — Progress Notes (Signed)
Patient presents today for treatment and follow up visit with Dr. Delton Coombes. Magnesium 1.6 today. Labs within parameters for treatment. Vital signs within parameters for treatment.   Message received from A. Ouida Sills RN / Dr. Delton Coombes to proceed with treatment. Orders received NO Magnesium Sulfate needed today per A. Ouida Sills RN / Dr. Delton Coombes.    Treatment given today per MD orders. Tolerated infusion without adverse affects. Vital signs stable. No complaints at this time. Discharged from clinic ambulatory in stable condition. Alert and oriented x 3. F/U with [facility] as scheduled.

## 2022-08-12 NOTE — Progress Notes (Signed)
Patient has been examined by Dr. Katragadda, and vital signs and labs have been reviewed. ANC, Creatinine, LFTs, hemoglobin, and platelets are within treatment parameters per M.D. - pt may proceed with treatment.  Primary RN and pharmacy notified.  

## 2022-08-12 NOTE — Patient Instructions (Addendum)
[facility] at [facility] Discharge Instructions   You were seen and examined today by Dr. Delton Coombes.  He reviewed the results of your lab work. Your magnesium is slightly low at 1.6. All other results are normal/stable.   He discussed with you switching to a drug called Kadcyla, which is basically Herceptin with a little chemotherapy attached to it that is driven into the cancer cells. The Kadcyla can cause side effects like low blood counts, diarrhea. There is a 5% greater benefit with the Kadcyla. But there is no clear data if Kadcyla is superior to receiving both Herceptin and Perjeta.   We will proceed with your treatment today.  Return as scheduled.    Thank you for choosing [facility] at [facility] to provide your oncology and hematology care.  To afford each patient quality time with our provider, please arrive at least 15 minutes before your scheduled appointment time.   If you have a lab appointment with the [facility] please come in thru the Main Entrance and check in at the main information desk.  You need to re-schedule your appointment should you arrive 10 or more minutes late.  We strive to give you quality time with our providers, and arriving late affects you and other patients whose appointments are after yours.  Also, if you no show three or more times for appointments you may be dismissed from the clinic at the providers discretion.     Again, thank you for choosing [facility].  Our hope is that these requests will decrease the amount of time that you wait before being seen by our physicians.       _____________________________________________________________  Should you have questions after your visit to [facility], please contact our office at [phone] and follow the prompts.  Our office hours are 8:00 a.m. and 4:30 p.m. Monday - Friday.  Please note that voicemails left after 4:00 p.m.  may not be returned until the following business day.  We are closed weekends and major holidays.  You do have access to a nurse 24-7, just call the main number to the clinic [phone] and do not press any options, hold on the line and a nurse will answer the phone.    For prescription refill requests, have your pharmacy contact our office and allow 72 hours.    Due to Covid, you will need to wear a mask upon entering the hospital. If you do not have a mask, a mask will be given to you at the Main Entrance upon arrival. For doctor visits, patients may have 1 support person age 63 or older with them. For treatment visits, patients can not have anyone with them due to social distancing guidelines and our immunocompromised population.

## 2022-08-13 LAB — CANCER ANTIGEN 15-3: CA 15-3: 26.8 U/mL — ABNORMAL HIGH (ref 0.0–25.0)

## 2022-08-13 LAB — CANCER ANTIGEN 27.29: CA 27.29: 23.6 U/mL (ref 0.0–38.6)

## 2022-08-20 ENCOUNTER — Other Ambulatory Visit: Payer: Self-pay

## 2022-08-26 ENCOUNTER — Encounter: Payer: Self-pay | Admitting: Hematology

## 2022-08-26 ENCOUNTER — Encounter (HOSPITAL_COMMUNITY): Payer: Self-pay | Admitting: Hematology

## 2022-08-27 ENCOUNTER — Encounter (HOSPITAL_COMMUNITY): Payer: Self-pay | Admitting: Hematology

## 2022-08-27 ENCOUNTER — Encounter: Payer: Self-pay | Admitting: Hematology

## 2022-09-02 ENCOUNTER — Inpatient Hospital Stay: Payer: Managed Care, Other (non HMO) | Attending: Oncology

## 2022-09-02 ENCOUNTER — Inpatient Hospital Stay: Payer: Managed Care, Other (non HMO)

## 2022-09-02 ENCOUNTER — Ambulatory Visit: Payer: Managed Care, Other (non HMO) | Admitting: Hematology

## 2022-09-02 VITALS — BP 153/81 | HR 66 | Temp 97.5°F | Resp 18

## 2022-09-02 DIAGNOSIS — Z79811 Long term (current) use of aromatase inhibitors: Secondary | ICD-10-CM | POA: Insufficient documentation

## 2022-09-02 DIAGNOSIS — Z170 Estrogen receptor positive status [ER+]: Secondary | ICD-10-CM | POA: Diagnosis not present

## 2022-09-02 DIAGNOSIS — Z5111 Encounter for antineoplastic chemotherapy: Secondary | ICD-10-CM | POA: Diagnosis present

## 2022-09-02 DIAGNOSIS — Z95828 Presence of other vascular implants and grafts: Secondary | ICD-10-CM

## 2022-09-02 DIAGNOSIS — Z79899 Other long term (current) drug therapy: Secondary | ICD-10-CM | POA: Diagnosis not present

## 2022-09-02 DIAGNOSIS — C50812 Malignant neoplasm of overlapping sites of left female breast: Secondary | ICD-10-CM | POA: Diagnosis not present

## 2022-09-02 LAB — CBC WITH DIFFERENTIAL/PLATELET
Abs Immature Granulocytes: 0.03 10*3/uL (ref 0.00–0.07)
Basophils Absolute: 0.1 10*3/uL (ref 0.0–0.1)
Basophils Relative: 1 %
Eosinophils Absolute: 0.2 10*3/uL (ref 0.0–0.5)
Eosinophils Relative: 3 %
HCT: 38 % (ref 36.0–46.0)
Hemoglobin: 11.9 g/dL — ABNORMAL LOW (ref 12.0–15.0)
Immature Granulocytes: 1 %
Lymphocytes Relative: 19 %
Lymphs Abs: 1.2 10*3/uL (ref 0.7–4.0)
MCH: 29.8 pg (ref 26.0–34.0)
MCHC: 31.3 g/dL (ref 30.0–36.0)
MCV: 95 fL (ref 80.0–100.0)
Monocytes Absolute: 0.5 10*3/uL (ref 0.1–1.0)
Monocytes Relative: 8 %
Neutro Abs: 4.4 10*3/uL (ref 1.7–7.7)
Neutrophils Relative %: 68 %
Platelets: 210 10*3/uL (ref 150–400)
RBC: 4 MIL/uL (ref 3.87–5.11)
RDW: 15.1 % (ref 11.5–15.5)
WBC: 6.5 10*3/uL (ref 4.0–10.5)
nRBC: 0 % (ref 0.0–0.2)

## 2022-09-02 LAB — COMPREHENSIVE METABOLIC PANEL
ALT: 14 U/L (ref 0–44)
AST: 17 U/L (ref 15–41)
Albumin: 3.8 g/dL (ref 3.5–5.0)
Alkaline Phosphatase: 146 U/L — ABNORMAL HIGH (ref 38–126)
Anion gap: 7 (ref 5–15)
BUN: 31 mg/dL — ABNORMAL HIGH (ref 8–23)
CO2: 23 mmol/L (ref 22–32)
Calcium: 9.6 mg/dL (ref 8.9–10.3)
Chloride: 106 mmol/L (ref 98–111)
Creatinine, Ser: 1.27 mg/dL — ABNORMAL HIGH (ref 0.44–1.00)
GFR, Estimated: 47 mL/min — ABNORMAL LOW (ref >60)
Glucose, Bld: 101 mg/dL — ABNORMAL HIGH (ref 70–99)
Potassium: 4 mmol/L (ref 3.5–5.1)
Sodium: 136 mmol/L (ref 135–145)
Total Bilirubin: 0.5 mg/dL (ref 0.3–1.2)
Total Protein: 7.1 g/dL (ref 6.5–8.1)

## 2022-09-02 LAB — MAGNESIUM: Magnesium: 1.6 mg/dL — ABNORMAL LOW (ref 1.7–2.4)

## 2022-09-02 MED ORDER — LORATADINE 10 MG PO TABS
10.0000 mg | ORAL_TABLET | Freq: Once | ORAL | Status: AC
  Administered 2022-09-02: 10 mg via ORAL
  Filled 2022-09-02: qty 1

## 2022-09-02 MED ORDER — TRASTUZUMAB-ANNS CHEMO 420 MG IV SOLR
6.0000 mg/kg | Freq: Once | INTRAVENOUS | Status: AC
  Administered 2022-09-02: 420 mg via INTRAVENOUS
  Filled 2022-09-02: qty 20

## 2022-09-02 MED ORDER — HEPARIN SOD (PORK) LOCK FLUSH 100 UNIT/ML IV SOLN
500.0000 [IU] | Freq: Once | INTRAVENOUS | Status: AC | PRN
  Administered 2022-09-02: 500 [IU]

## 2022-09-02 MED ORDER — SODIUM CHLORIDE 0.9% FLUSH
10.0000 mL | Freq: Once | INTRAVENOUS | Status: AC
  Administered 2022-09-02: 10 mL via INTRAVENOUS

## 2022-09-02 MED ORDER — MAGNESIUM SULFATE 2 GM/50ML IV SOLN
2.0000 g | Freq: Once | INTRAVENOUS | Status: AC
  Administered 2022-09-02: 2 g via INTRAVENOUS
  Filled 2022-09-02: qty 50

## 2022-09-02 MED ORDER — ACETAMINOPHEN 325 MG PO TABS
650.0000 mg | ORAL_TABLET | Freq: Once | ORAL | Status: AC
  Administered 2022-09-02: 650 mg via ORAL
  Filled 2022-09-02: qty 2

## 2022-09-02 MED ORDER — SODIUM CHLORIDE 0.9% FLUSH
10.0000 mL | INTRAVENOUS | Status: DC | PRN
  Administered 2022-09-02: 10 mL

## 2022-09-02 MED ORDER — DIPHENHYDRAMINE HCL 25 MG PO CAPS: 50.0000 mg | ORAL_CAPSULE | Freq: Once | ORAL | Status: DC

## 2022-09-02 MED ORDER — SODIUM CHLORIDE 0.9 % IV SOLN: Freq: Once | INTRAVENOUS | Status: AC

## 2022-09-02 MED ORDER — SODIUM CHLORIDE 0.9 % IV SOLN
420.0000 mg | Freq: Once | INTRAVENOUS | Status: AC
  Administered 2022-09-02: 420 mg via INTRAVENOUS
  Filled 2022-09-02: qty 14

## 2022-09-02 NOTE — Patient Instructions (Signed)
[facility]  Discharge Instructions: Thank you for choosing [facility] to provide your oncology and hematology care.  If you have a lab appointment with the [facility], please come in thru the Main Entrance and check in at the main information desk.  Wear comfortable clothing and clothing appropriate for easy access to any Portacath or PICC line.   We strive to give you quality time with your provider. You may need to reschedule your appointment if you arrive late (15 or more minutes).  Arriving late affects you and other patients whose appointments are after yours.  Also, if you miss three or more appointments without notifying the office, you may be dismissed from the clinic at the provider's discretion.      For prescription refill requests, have your pharmacy contact our office and allow 72 hours for refills to be completed.    Today you received the following chemotherapy and/or immunotherapy agents Kanjinit, Perjeta. Pertuzumab Injection What is this medication? PERTUZUMAB (per TOOZ ue mab) treats breast cancer. It works by blocking a protein that causes cancer cells to grow and multiply. This helps to slow or stop the spread of cancer cells. It is a monoclonal antibody. This medicine may be used for other purposes; ask your health care provider or pharmacist if you have questions. COMMON BRAND NAME(S): PERJETA What should I tell my care team before I take this medication? They need to know if you have any of these conditions: Heart failure An unusual or allergic reaction to pertuzumab, other medications, foods, dyes, or preservatives Pregnant or trying to get pregnant Breast-feeding How should I use this medication? This medication is injected into a vein. It is given by your care team in a hospital or clinic setting. Talk to your care team about the use of this medication in children. Special care may be needed. Overdosage: If you think you  have taken too much of this medicine contact a poison control center or emergency room at once. NOTE: This medicine is only for you. Do not share this medicine with others. What if I miss a dose? Keep appointments for follow-up doses. It is important not to miss your dose. Call your care team if you are unable to keep an appointment. What may interact with this medication? Interactions are not expected. This list may not describe all possible interactions. Give your health care provider a list of all the medicines, herbs, non-prescription drugs, or dietary supplements you use. Also tell them if you smoke, drink alcohol, or use illegal drugs. Some items may interact with your medicine. What should I watch for while using this medication? Your condition will be monitored carefully while you are receiving this medication. This medication may make you feel generally unwell. This is not uncommon as chemotherapy can affect healthy cells as well as cancer cells. Report any side effects. Continue your course of treatment even though you feel ill unless your care team tells you to stop. Talk to your care team if you may be pregnant. Serious birth defects can occur if you take this medication during pregnancy and for 7 months after the last dose. You will need a negative pregnancy test before starting this medication. Contraception is recommended while taking this medication and for 7 months after the last dose. Your care team can help you find the option that works for you. Do not breastfeed while taking this medication and for 7 months after the last dose. What side effects  may I notice from receiving this medication? Side effects that you should report to your care team as soon as possible: Allergic reactions or angioedema--skin rash, itching or hives, swelling of the face, eyes, lips, tongue, arms, or legs, trouble swallowing or breathing Heart failure--shortness of breath, swelling of the ankles, feet, or  hands, sudden weight gain, unusual weakness or fatigue Infusion reactions--chest pain, shortness of breath or trouble breathing, feeling faint or lightheaded Side effects that usually do not require medical attention (report to your care team if they continue or are bothersome): Diarrhea Dry skin Fatigue Hair loss Nausea Vomiting This list may not describe all possible side effects. Call your doctor for medical advice about side effects. You may report side effects to FDA at 1-800-FDA-1088. Where should I keep my medication? This medication is given in a hospital or clinic. It will not be stored at home. NOTE: This sheet is a summary. It may not cover all possible information. If you have questions about this medicine, talk to your doctor, pharmacist, or health care provider.  2023 Elsevier/Gold Standard (2021-12-22 00:00:00)       To help prevent nausea and vomiting after your treatment, we encourage you to take your nausea medication as directed.  BELOW ARE SYMPTOMS THAT SHOULD BE REPORTED IMMEDIATELY: *FEVER GREATER THAN 100.4 F (38 C) OR HIGHER *CHILLS OR SWEATING *NAUSEA AND VOMITING THAT IS NOT CONTROLLED WITH YOUR NAUSEA MEDICATION *UNUSUAL SHORTNESS OF BREATH *UNUSUAL BRUISING OR BLEEDING *URINARY PROBLEMS (pain or burning when urinating, or frequent urination) *BOWEL PROBLEMS (unusual diarrhea, constipation, pain near the anus) TENDERNESS IN MOUTH AND THROAT WITH OR WITHOUT PRESENCE OF ULCERS (sore throat, sores in mouth, or a toothache) UNUSUAL RASH, SWELLING OR PAIN  UNUSUAL VAGINAL DISCHARGE OR ITCHING   Items with * indicate a potential emergency and should be followed up as soon as possible or go to the Emergency Department if any problems should occur.  Please show the CHEMOTHERAPY ALERT CARD or IMMUNOTHERAPY ALERT CARD at check-in to the Emergency Department and triage nurse.  Should you have questions after your visit or need to cancel or reschedule your  appointment, please contact [facility] [phone]  and follow the prompts.  Office hours are 8:00 a.m. to 4:30 p.m. Monday - Friday. Please note that voicemails left after 4:00 p.m. may not be returned until the following business day.  We are closed weekends and major holidays. You have access to a nurse at all times for urgent questions. Please call the main number to the clinic [phone] and follow the prompts.  For any non-urgent questions, you may also contact your provider using MyChart. We now offer e-Visits for anyone 42 and older to request care online for non-urgent symptoms. For details visit [email or web address].   Also download the MyChart app! Go to the app store, search "MyChart", open the app, select [facility], and log in with your MyChart username and password.

## 2022-09-02 NOTE — Progress Notes (Signed)
Patient presents today for C13 D1 [place], [place]. Vital signs within parameters for treatment today. Labs pending. MAR reviewed and updated. Patient denies any changes since her last treatment. Patient denies side effects related to last treatment.   Labs within parameters for treatment. Magnesium 1.6 today. Patient will receive 2 grams of Magnesium with treatment today per standing orders.   Treatment given today per MD orders. Tolerated infusion without adverse affects. Vital signs stable. No complaints at this time. Discharged from clinic ambulatory in stable condition. Alert and oriented x 3. F/U with [facility] as scheduled.

## 2022-09-02 NOTE — Progress Notes (Signed)
Discontinue diphenhydramine from oncology treatment plan --> Add loratidine 10 mg po x 1 as premedication for oncology treatment plan.  T.O. Dr Rhys Martini, PharmD

## 2022-09-05 ENCOUNTER — Other Ambulatory Visit: Payer: Self-pay | Admitting: Physician Assistant

## 2022-09-05 DIAGNOSIS — I82412 Acute embolism and thrombosis of left femoral vein: Secondary | ICD-10-CM

## 2022-09-23 ENCOUNTER — Inpatient Hospital Stay: Payer: Managed Care, Other (non HMO)

## 2022-09-23 ENCOUNTER — Inpatient Hospital Stay: Payer: Managed Care, Other (non HMO) | Attending: Oncology

## 2022-09-23 ENCOUNTER — Inpatient Hospital Stay: Payer: Managed Care, Other (non HMO) | Admitting: Hematology

## 2022-09-23 VITALS — BP 121/89 | HR 65 | Temp 97.5°F | Resp 18

## 2022-09-23 DIAGNOSIS — Z9221 Personal history of antineoplastic chemotherapy: Secondary | ICD-10-CM | POA: Diagnosis not present

## 2022-09-23 DIAGNOSIS — Z9012 Acquired absence of left breast and nipple: Secondary | ICD-10-CM | POA: Insufficient documentation

## 2022-09-23 DIAGNOSIS — Z170 Estrogen receptor positive status [ER+]: Secondary | ICD-10-CM | POA: Diagnosis not present

## 2022-09-23 DIAGNOSIS — Z95828 Presence of other vascular implants and grafts: Secondary | ICD-10-CM | POA: Diagnosis not present

## 2022-09-23 DIAGNOSIS — R918 Other nonspecific abnormal finding of lung field: Secondary | ICD-10-CM | POA: Diagnosis not present

## 2022-09-23 DIAGNOSIS — C50812 Malignant neoplasm of overlapping sites of left female breast: Secondary | ICD-10-CM | POA: Insufficient documentation

## 2022-09-23 DIAGNOSIS — N6011 Diffuse cystic mastopathy of right breast: Secondary | ICD-10-CM | POA: Diagnosis not present

## 2022-09-23 DIAGNOSIS — Z79899 Other long term (current) drug therapy: Secondary | ICD-10-CM | POA: Insufficient documentation

## 2022-09-23 DIAGNOSIS — Z5112 Encounter for antineoplastic immunotherapy: Secondary | ICD-10-CM | POA: Insufficient documentation

## 2022-09-23 LAB — CBC WITH DIFFERENTIAL/PLATELET
Abs Immature Granulocytes: 0.04 10*3/uL (ref 0.00–0.07)
Basophils Absolute: 0 10*3/uL (ref 0.0–0.1)
Basophils Relative: 1 %
Eosinophils Absolute: 0.2 10*3/uL (ref 0.0–0.5)
Eosinophils Relative: 3 %
HCT: 37 % (ref 36.0–46.0)
Hemoglobin: 11.8 g/dL — ABNORMAL LOW (ref 12.0–15.0)
Immature Granulocytes: 1 %
Lymphocytes Relative: 16 %
Lymphs Abs: 1 10*3/uL (ref 0.7–4.0)
MCH: 29.9 pg (ref 26.0–34.0)
MCHC: 31.9 g/dL (ref 30.0–36.0)
MCV: 93.7 fL (ref 80.0–100.0)
Monocytes Absolute: 0.5 10*3/uL (ref 0.1–1.0)
Monocytes Relative: 8 %
Neutro Abs: 4.5 10*3/uL (ref 1.7–7.7)
Neutrophils Relative %: 71 %
Platelets: 204 10*3/uL (ref 150–400)
RBC: 3.95 MIL/uL (ref 3.87–5.11)
RDW: 15.3 % (ref 11.5–15.5)
WBC: 6.4 10*3/uL (ref 4.0–10.5)
nRBC: 0 % (ref 0.0–0.2)

## 2022-09-23 LAB — COMPREHENSIVE METABOLIC PANEL
ALT: 13 U/L (ref 0–44)
AST: 16 U/L (ref 15–41)
Albumin: 4 g/dL (ref 3.5–5.0)
Alkaline Phosphatase: 142 U/L — ABNORMAL HIGH (ref 38–126)
Anion gap: 9 (ref 5–15)
BUN: 38 mg/dL — ABNORMAL HIGH (ref 8–23)
CO2: 22 mmol/L (ref 22–32)
Calcium: 10 mg/dL (ref 8.9–10.3)
Chloride: 104 mmol/L (ref 98–111)
Creatinine, Ser: 1.46 mg/dL — ABNORMAL HIGH (ref 0.44–1.00)
GFR, Estimated: 40 mL/min — ABNORMAL LOW (ref >60)
Glucose, Bld: 111 mg/dL — ABNORMAL HIGH (ref 70–99)
Potassium: 4.6 mmol/L (ref 3.5–5.1)
Sodium: 135 mmol/L (ref 135–145)
Total Bilirubin: 0.5 mg/dL (ref 0.3–1.2)
Total Protein: 7.2 g/dL (ref 6.5–8.1)

## 2022-09-23 LAB — MAGNESIUM: Magnesium: 2 mg/dL (ref 1.7–2.4)

## 2022-09-23 MED ORDER — SODIUM CHLORIDE 0.9% FLUSH
10.0000 mL | INTRAVENOUS | Status: DC | PRN
  Administered 2022-09-23: 10 mL

## 2022-09-23 MED ORDER — SODIUM CHLORIDE 0.9 % IV SOLN: Freq: Once | INTRAVENOUS | Status: AC

## 2022-09-23 MED ORDER — SODIUM CHLORIDE 0.9 % IV SOLN
420.0000 mg | Freq: Once | INTRAVENOUS | Status: AC
  Administered 2022-09-23: 420 mg via INTRAVENOUS
  Filled 2022-09-23: qty 14

## 2022-09-23 MED ORDER — TRASTUZUMAB-ANNS CHEMO 420 MG IV SOLR
6.0000 mg/kg | Freq: Once | INTRAVENOUS | Status: AC
  Administered 2022-09-23: 420 mg via INTRAVENOUS
  Filled 2022-09-23: qty 20

## 2022-09-23 MED ORDER — CETIRIZINE HCL 10 MG PO TABS: 10.0000 mg | ORAL_TABLET | Freq: Once | ORAL | Status: DC

## 2022-09-23 MED ORDER — DIPHENHYDRAMINE HCL 25 MG PO CAPS: 50.0000 mg | ORAL_CAPSULE | Freq: Once | ORAL | Status: DC

## 2022-09-23 MED ORDER — ACETAMINOPHEN 325 MG PO TABS: 650.0000 mg | ORAL_TABLET | Freq: Once | ORAL | Status: DC

## 2022-09-23 MED ORDER — HEPARIN SOD (PORK) LOCK FLUSH 100 UNIT/ML IV SOLN
500.0000 [IU] | Freq: Once | INTRAVENOUS | Status: AC | PRN
  Administered 2022-09-23: 500 [IU]

## 2022-09-23 MED ORDER — SODIUM CHLORIDE 0.9% FLUSH
10.0000 mL | Freq: Once | INTRAVENOUS | Status: AC
  Administered 2022-09-23: 10 mL via INTRAVENOUS

## 2022-09-23 NOTE — Patient Instructions (Signed)
[facility]  Discharge Instructions: Thank you for choosing [facility] to provide your oncology and hematology care.  If you have a lab appointment with the [facility], please come in thru the Main Entrance and check in at the main information desk.  Wear comfortable clothing and clothing appropriate for easy access to any Portacath or PICC line.   We strive to give you quality time with your provider. You may need to reschedule your appointment if you arrive late (15 or more minutes).  Arriving late affects you and other patients whose appointments are after yours.  Also, if you miss three or more appointments without notifying the office, you may be dismissed from the clinic at the provider's discretion.      For prescription refill requests, have your pharmacy contact our office and allow 72 hours for refills to be completed.    Today you received the following chemotherapy and/or immunotherapy agents Kanjinti and Perjeta   To help prevent nausea and vomiting after your treatment, we encourage you to take your nausea medication as directed.  Trastuzumab Injection What is this medication? TRASTUZUMAB (tras TOO zoo mab) treats breast cancer and stomach cancer. It works by blocking a protein that causes cancer cells to grow and multiply. This helps to slow or stop the spread of cancer cells. This medicine may be used for other purposes; ask your health care provider or pharmacist if you have questions. COMMON BRAND NAME(S): Herceptin, Janae Bridgeman, Ontruzant, Trazimera What should I tell my care team before I take this medication? They need to know if you have any of these conditions: Heart failure Lung disease An unusual or allergic reaction to trastuzumab, other medications, foods, dyes, or preservatives Pregnant or trying to get pregnant Breast-feeding How should I use this medication? This medication is injected into a vein. It is  given by your care team in a hospital or clinic setting. Talk to your care team about the use of this medication in children. It is not approved for use in children. Overdosage: If you think you have taken too much of this medicine contact a poison control center or emergency room at once. NOTE: This medicine is only for you. Do not share this medicine with others. What if I miss a dose? Keep appointments for follow-up doses. It is important not to miss your dose. Call your care team if you are unable to keep an appointment. What may interact with this medication? Certain types of chemotherapy, such as daunorubicin, doxorubicin, epirubicin, idarubicin This list may not describe all possible interactions. Give your health care provider a list of all the medicines, herbs, non-prescription drugs, or dietary supplements you use. Also tell them if you smoke, drink alcohol, or use illegal drugs. Some items may interact with your medicine. What should I watch for while using this medication? Your condition will be monitored carefully while you are receiving this medication. This medication may make you feel generally unwell. This is not uncommon, as chemotherapy affects healthy cells as well as cancer cells. Report any side effects. Continue your course of treatment even though you feel ill unless your care team tells you to stop. This medication may increase your risk of getting an infection. Call your care team for advice if you get a fever, chills, sore throat, or other symptoms of a cold or flu. Do not treat yourself. Try to avoid being around people who are sick. Avoid taking medications that contain aspirin,  acetaminophen, ibuprofen, naproxen, or ketoprofen unless instructed by your care team. These medications can hide a fever. Talk to your care team if you may be pregnant. Serious birth defects can occur if you take this medication during pregnancy and for 7 months after the last dose. You will need a  negative pregnancy test before starting this medication. Contraception is recommended while taking this medication and for 7 months after the last dose. Your care team can help you find the option that works for you. Do not breastfeed while taking this medication and for 7 months after stopping treatment. What side effects may I notice from receiving this medication? Side effects that you should report to your care team as soon as possible: Allergic reactions or angioedema--skin rash, itching or hives, swelling of the face, eyes, lips, tongue, arms, or legs, trouble swallowing or breathing Dry cough, shortness of breath or trouble breathing Heart failure--shortness of breath, swelling of the ankles, feet, or hands, sudden weight gain, unusual weakness or fatigue Infection--fever, chills, cough, or sore throat Infusion reactions--chest pain, shortness of breath or trouble breathing, feeling faint or lightheaded Side effects that usually do not require medical attention (report to your care team if they continue or are bothersome): Diarrhea Dizziness Headache Nausea Trouble sleeping Vomiting This list may not describe all possible side effects. Call your doctor for medical advice about side effects. You may report side effects to FDA at 1-800-FDA-1088. Where should I keep my medication? This medication is given in a hospital or clinic. It will not be stored at home. NOTE: This sheet is a summary. It may not cover all possible information. If you have questions about this medicine, talk to your doctor, pharmacist, or health care provider.  2023 Elsevier/Gold Standard (2021-12-10 00:00:00)  Pertuzumab Injection What is this medication? PERTUZUMAB (per TOOZ ue mab) treats breast cancer. It works by blocking a protein that causes cancer cells to grow and multiply. This helps to slow or stop the spread of cancer cells. It is a monoclonal antibody. This medicine may be used for other purposes; ask  your health care provider or pharmacist if you have questions. COMMON BRAND NAME(S): PERJETA What should I tell my care team before I take this medication? They need to know if you have any of these conditions: Heart failure An unusual or allergic reaction to pertuzumab, other medications, foods, dyes, or preservatives Pregnant or trying to get pregnant Breast-feeding How should I use this medication? This medication is injected into a vein. It is given by your care team in a hospital or clinic setting. Talk to your care team about the use of this medication in children. Special care may be needed. Overdosage: If you think you have taken too much of this medicine contact a poison control center or emergency room at once. NOTE: This medicine is only for you. Do not share this medicine with others. What if I miss a dose? Keep appointments for follow-up doses. It is important not to miss your dose. Call your care team if you are unable to keep an appointment. What may interact with this medication? Interactions are not expected. This list may not describe all possible interactions. Give your health care provider a list of all the medicines, herbs, non-prescription drugs, or dietary supplements you use. Also tell them if you smoke, drink alcohol, or use illegal drugs. Some items may interact with your medicine. What should I watch for while using this medication? Your condition will be monitored carefully while  you are receiving this medication. This medication may make you feel generally unwell. This is not uncommon as chemotherapy can affect healthy cells as well as cancer cells. Report any side effects. Continue your course of treatment even though you feel ill unless your care team tells you to stop. Talk to your care team if you may be pregnant. Serious birth defects can occur if you take this medication during pregnancy and for 7 months after the last dose. You will need a negative pregnancy  test before starting this medication. Contraception is recommended while taking this medication and for 7 months after the last dose. Your care team can help you find the option that works for you. Do not breastfeed while taking this medication and for 7 months after the last dose. What side effects may I notice from receiving this medication? Side effects that you should report to your care team as soon as possible: Allergic reactions or angioedema--skin rash, itching or hives, swelling of the face, eyes, lips, tongue, arms, or legs, trouble swallowing or breathing Heart failure--shortness of breath, swelling of the ankles, feet, or hands, sudden weight gain, unusual weakness or fatigue Infusion reactions--chest pain, shortness of breath or trouble breathing, feeling faint or lightheaded Side effects that usually do not require medical attention (report to your care team if they continue or are bothersome): Diarrhea Dry skin Fatigue Hair loss Nausea Vomiting This list may not describe all possible side effects. Call your doctor for medical advice about side effects. You may report side effects to FDA at 1-800-FDA-1088. Where should I keep my medication? This medication is given in a hospital or clinic. It will not be stored at home. NOTE: This sheet is a summary. It may not cover all possible information. If you have questions about this medicine, talk to your doctor, pharmacist, or health care provider.  2023 Elsevier/Gold Standard (2021-12-22 00:00:00)    BELOW ARE SYMPTOMS THAT SHOULD BE REPORTED IMMEDIATELY: *FEVER GREATER THAN 100.4 F (38 C) OR HIGHER *CHILLS OR SWEATING *NAUSEA AND VOMITING THAT IS NOT CONTROLLED WITH YOUR NAUSEA MEDICATION *UNUSUAL SHORTNESS OF BREATH *UNUSUAL BRUISING OR BLEEDING *URINARY PROBLEMS (pain or burning when urinating, or frequent urination) *BOWEL PROBLEMS (unusual diarrhea, constipation, pain near the anus) TENDERNESS IN MOUTH AND THROAT WITH OR  WITHOUT PRESENCE OF ULCERS (sore throat, sores in mouth, or a toothache) UNUSUAL RASH, SWELLING OR PAIN  UNUSUAL VAGINAL DISCHARGE OR ITCHING   Items with * indicate a potential emergency and should be followed up as soon as possible or go to the Emergency Department if any problems should occur.  Please show the CHEMOTHERAPY ALERT CARD or IMMUNOTHERAPY ALERT CARD at check-in to the Emergency Department and triage nurse.  Should you have questions after your visit or need to cancel or reschedule your appointment, please contact [facility] [phone]  and follow the prompts.  Office hours are 8:00 a.m. to 4:30 p.m. Monday - Friday. Please note that voicemails left after 4:00 p.m. may not be returned until the following business day.  We are closed weekends and major holidays. You have access to a nurse at all times for urgent questions. Please call the main number to the clinic [phone] and follow the prompts.  For any non-urgent questions, you may also contact your provider using MyChart. We now offer e-Visits for anyone 51 and older to request care online for non-urgent symptoms. For details visit [email or web address].   Also download the MyChart app! Go to the app  store, search "MyChart", open the app, select [facility], and log in with your MyChart username and password.

## 2022-09-23 NOTE — Progress Notes (Signed)
Discontinue diphenhydramine from oncology treatment plan --> Add cetirizine 10 mg orally x 1 as premedication for oncology treatment plan.  T.O. Dr Rhys Martini, PharmD

## 2022-09-23 NOTE — Patient Instructions (Signed)
[facility] at [facility] Discharge Instructions   You were seen and examined today by Dr. Delton Coombes.  He reviewed the results of your CBC which are good. Liver and kidney numbers, and electrolytes results are pending.   We will proceed with your treatment today.  Return as scheduled.     Thank you for choosing [facility] at [facility] to provide your oncology and hematology care.  To afford each patient quality time with our provider, please arrive at least 15 minutes before your scheduled appointment time.   If you have a lab appointment with the [facility] please come in thru the Main Entrance and check in at the main information desk.  You need to re-schedule your appointment should you arrive 10 or more minutes late.  We strive to give you quality time with our providers, and arriving late affects you and other patients whose appointments are after yours.  Also, if you no show three or more times for appointments you may be dismissed from the clinic at the providers discretion.     Again, thank you for choosing [facility]  Our hope is that these requests will decrease the amount of time that you wait before being seen by our physicians.       _____________________________________________________________  Should you have questions after your visit to [facility], please contact our office at [phone] and follow the prompts.  Our office hours are 8:00 a.m. and 4:30 p.m. Monday - Friday.  Please note that voicemails left after 4:00 p.m. may not be returned until the following business day.  We are closed weekends and major holidays.  You do have access to a nurse 24-7, just call the main number to the clinic [phone] and do not press any options, hold on the line and a nurse will answer the phone.    For prescription refill requests, have your pharmacy contact our office and allow 72 hours.    Due to  Covid, you will need to wear a mask upon entering the hospital. If you do not have a mask, a mask will be given to you at the Main Entrance upon arrival. For doctor visits, patients may have 1 support person age 30 or older with them. For treatment visits, patients can not have anyone with them due to social distancing guidelines and our immunocompromised population.

## 2022-09-23 NOTE — Progress Notes (Signed)
Traci Fuller, Traci Fuller [id number]   CLINIC:  Medical Oncology/Hematology  PCP:  Traci Squibb, MD [address] Traci Fuller [place] [address] [phone]   REASON FOR VISIT:  Follow-up for locally advanced left breast cancer  PRIOR THERAPY: TCHP x 6 cycles.  NGS Results: not done  CURRENT THERAPY: Anastrozole daily, trastuzumab and Herceptin every 21 days.   BRIEF ONCOLOGIC HISTORY:  Oncology History  Breast cancer, left ([place])  10/15/2021 Initial Diagnosis   Breast cancer, left ([place])   11/04/2021 - 04/22/2022 Chemotherapy   Patient is on Treatment Plan : BREAST  Docetaxel + Carboplatin + Trastuzumab + Pertuzumab  (TCHP) q21d       Genetic Testing   Negative genetic testing. No pathogenic variants identified on the [facility] Multi-Cancer+RNA panel. The report date is 11/02/2021.   The Multi-Cancer Panel + RNA offered by [facility] includes sequencing and/or deletion duplication testing of the following 84 genes: AIP, ALK, APC, ATM, AXIN2,BAP1,  BARD1, BLM, BMPR1A, BRCA1, BRCA2, BRIP1, CASR, CDC73, CDH1, CDK4, CDKN1B, CDKN1C, CDKN2A (p14ARF), CDKN2A (p16INK4a), CEBPA, CHEK2, CTNNA1, DICER1, DIS3L2, EGFR (c.2369C>T, p.Thr790Met variant only), EPCAM (Deletion/duplication testing only), FH, FLCN, GATA2, GPC3, GREM1 (Promoter region deletion/duplication testing only), HOXB13 (c.251G>A, p.Gly84Glu), HRAS, KIT, MAX, MEN1, MET, MITF (c.952G>A, p.Glu318Lys variant only), MLH1, MSH2, MSH3, MSH6, MUTYH, NBN, NF1, NF2, NTHL1, PALB2, PDGFRA, PHOX2B, PMS2, POLD1, POLE, POT1, PRKAR1A, PTCH1, PTEN, RAD50, RAD51C, RAD51D, RB1, RECQL4, RET, RUNX1, SDHAF2, SDHA (sequence changes only), SDHB, SDHC, SDHD, SMAD4, SMARCA4, SMARCB1, SMARCE1, STK11, SUFU, TERC, TERT, TMEM127, TP53, TSC1, TSC2, VHL, WRN and WT1.   11/04/2021 -  Chemotherapy   Patient is on Treatment Plan : BREAST  Docetaxel + Carboplatin + Trastuzumab + Pertuzumab  (TCHP) q21d / Trastuzumab + Pertuzumab q21d        CANCER STAGING:  Cancer Staging  Breast cancer, left ([place]) Staging form: Breast, AJCC 8th Edition - Clinical stage from 10/15/2021: Stage IIA (cT3, cN1, cM0, G2, ER+, PR+, HER2+) - Unsigned   INTERVAL HISTORY:  [name], a 67 y.o. female, seen for follow-up of HER2 positive left breast cancer before cycle 14 of chemotherapy.  She is tolerating Herceptin and Perjeta very well. She was last seen by me on 08/12/22.   Today, she states that she is doing well overall. Her appetite level is at 100%. Her energy level is at 70% but she is still functioning well and is able to perform her ADLs without difficulty. She denies any pain. She started radiation on 09/13/22. Patient reports that she will likely undergo 5-6 weeks of radiation per her radiation oncologist Dr. Sharyn Fuller. She has noticed some pink discoloration but denies any skin breakdown. She had a few days of loose stools but nothing particularly bothersome to her.   She has had some hot flashes with Anastrozole but is not particularly bothered by this. She denies any orthopnea or PND. She is scheduled for a repeat echocardiogram next month.  She reports having some occasional cramping in her hands. She is taking MagOx '400mg'$  TID.  REVIEW OF SYSTEMS:  Review of Systems  Constitutional:  Negative for appetite change, chills, fatigue and fever.  HENT:   Negative for lump/mass, mouth sores, nosebleeds, sore throat and trouble swallowing.   Eyes:  Negative for eye problems.  Respiratory:  Negative for cough.   Cardiovascular:  Negative for chest pain, leg swelling and palpitations.  Gastrointestinal:  Negative for abdominal pain, constipation, diarrhea, nausea and  vomiting.  Endocrine: Positive for hot flashes.  Genitourinary:  Negative for bladder incontinence, difficulty urinating, dysuria, frequency, hematuria and nocturia.   Musculoskeletal:  Positive for myalgias (mild, occasional hand cramps). Negative for  arthralgias, back pain, flank pain and neck pain.  Skin:  Negative for itching, rash and wound.  Neurological:  Positive for headaches. Negative for dizziness and numbness.  Hematological:  Does not bruise/bleed easily.  Psychiatric/Behavioral:  Negative for depression, sleep disturbance and suicidal ideas. The patient is not nervous/anxious.   All other systems reviewed and are negative.   PAST MEDICAL/SURGICAL HISTORY:  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 DES to midLAD, 75% stenosis of RCA not treated   Cancer [facility])    Current smoker    Essential hypertension    Family history of bladder cancer    Family history of breast cancer    Family history of leukemia    Family history of uterine cancer    Hyperlipidemia LDL goal <70    Myocardial infarction ([place])    PONV (postoperative nausea and vomiting)    Port-A-Cath in place 10/29/2021   Past Surgical History:  Procedure Laterality Date   ABDOMINAL HYSTERECTOMY     BREAST BIOPSY Bilateral 10/16/2021   BREAST BIOPSY WITH RADIO FREQUENCY LOCALIZER Right 05/05/2022   Procedure: BREAST BIOPSY WITH RADIO FREQUENCY LOCALIZER;  Surgeon: Virl Cagey, MD;  Location: [facility];  Service: General;  Laterality: Right;   CORONARY STENT INTERVENTION N/A 01/10/2018   Procedure: CORONARY STENT INTERVENTION;  Surgeon: Jettie Booze, MD;  Location: [place] [facility];  Service: Cardiovascular;  Laterality: N/A;   LEFT HEART CATH AND CORONARY ANGIOGRAPHY N/A 01/10/2018   Procedure: LEFT HEART CATH AND CORONARY ANGIOGRAPHY;  Surgeon: Jettie Booze, MD;  Location: [place] [facility];  Service: Cardiovascular;  Laterality: N/A;   MASTECTOMY MODIFIED RADICAL Left 05/05/2022   Procedure: MASTECTOMY MODIFIED RADICAL WITH RADIO FREQUENCY LOCALIZER;  Surgeon: Virl Cagey, MD;  Location: [facility];  Service: General;  Laterality: Left;   PORTACATH PLACEMENT Right 10/26/2021   Procedure:  INSERTION PORT-A-CATH;  Surgeon: Virl Cagey, MD;  Location: [facility];  Service: General;  Laterality: Right;    SOCIAL HISTORY:  Social History   Socioeconomic History   Marital status: Married    Spouse name: Not on file   Number of children: Not on file   Years of education: Not on file   Highest education level: Not on file  Occupational History   Not on file  Tobacco Use   Smoking status: Every Day    Packs/day: 1.00    Years: 43.00    Total pack years: 43.00    Types: Cigarettes    Start date: 1976   Smokeless tobacco: Never  Vaping Use   Vaping Use: Never used  Substance and Sexual Activity   Alcohol use: Not Currently   Drug use: Never   Sexual activity: Yes  Other Topics Concern   Not on file  Social History Narrative   Not on file   Social Determinants of Health   Financial Resource Strain: Not on file  Food Insecurity: No Food Insecurity (05/05/2022)   Hunger Vital Sign    Worried About Running Out of Food in the Last Year: Never true    Ran Out of Food in the Last Year: Never true  Transportation Needs: No Transportation Needs (05/05/2022)   PRAPARE - Transportation  Lack of Transportation (Medical): No    Lack of Transportation (Non-Medical): No  Physical Activity: Not on file  Stress: Not on file  Social Connections: Not on file  Intimate Partner Violence: Not At Risk (05/05/2022)   Humiliation, Afraid, Rape, and Kick questionnaire    Fear of Current or Ex-Partner: No    Emotionally Abused: No    Physically Abused: No    Sexually Abused: No    FAMILY HISTORY:  Family History  Problem Relation Age of Onset   Bladder Cancer Mother 52   Cervical cancer Mother        dx 96s, hysterectomy   Hypertension Father    Cirrhosis Father    Leukemia Brother 50   Liver cancer Brother 67   Breast cancer Maternal Aunt        dx 26s   Cancer Maternal Uncle        unk type d. 70   Cancer Paternal Aunt        unk type   Breast cancer Maternal  Grandmother        dx 23s   Uterine cancer Maternal Grandmother     CURRENT MEDICATIONS:  Current Outpatient Medications  Medication Sig Dispense Refill   ALPRAZolam (XANAX) 0.25 MG tablet Take 1 tablet (0.25 mg total) by mouth 2 (two) times daily as needed for anxiety. 60 tablet 2   amLODipine (NORVASC) 10 MG tablet Take 10 mg by mouth daily.     anastrozole (ARIMIDEX) 1 MG tablet Take 1 tablet (1 mg total) by mouth daily. 90 tablet 4   apixaban (ELIQUIS) 5 MG TABS tablet Take 1 tablet (5 mg total) by mouth 2 (two) times daily. 60 tablet 5   aspirin EC 81 MG tablet Take 1 tablet (81 mg total) by mouth daily. 90 tablet 3   Calcium Carb-Cholecalciferol (CALCIUM 600/VITAMIN D3 PO) Take 1 tablet by mouth in the morning and at bedtime.     CARBOPLATIN IV Inject into the vein every 21 ( twenty-one) days.     DOCEtaxel (TAXOTERE IV) Inject into the vein every 21 ( twenty-one) days.     Evolocumab (REPATHA SURECLICK) 387 MG/ML SOAJ Inject 140 mg into the skin every 14 (fourteen) days. 2 mL 11   gabapentin (NEURONTIN) 300 MG capsule Take 300 mg by mouth 2 (two) times daily.     magnesium oxide (MAG-OX) 400 (240 Mg) MG tablet Take 1 tablet (400 mg total) by mouth in the morning, at noon, and at bedtime. 90 tablet 4   metoprolol tartrate (LOPRESSOR) 25 MG tablet Take 1 tablet (25 mg total) by mouth 2 (two) times daily. Please call and schedule overdue appointment with Dr. Gwenlyn Found for further refills . SECOND ATTEMPT! 30 tablet 0   omeprazole (PRILOSEC OTC) 20 MG tablet Take 20 mg by mouth daily.     ondansetron (ZOFRAN-ODT) 4 MG disintegrating tablet Take 1 tablet (4 mg total) by mouth every 6 (six) hours as needed for nausea. 20 tablet 0   oxyCODONE (OXY IR/ROXICODONE) 5 MG immediate release tablet Take 1-2 tablets (5-10 mg total) by mouth every 4 (four) hours as needed for severe pain or breakthrough pain. 30 tablet 0   pegfilgrastim-bmez (ZIEXTENZO) 6 MG/0.6ML injection Inject 6 mg into the skin every  21 ( twenty-one) days. AFTER chemotherapy.     Pertuzumab (PERJETA IV) Inject into the vein every 21 ( twenty-one) days.     trastuzumab-anns (KANJINTI) 150 MG SOLR injection Inject 462 mg into the vein  every 21 ( twenty-one) days.     mometasone (ELOCON) 0.1 % cream Apply topically 2 (two) times daily.     No current facility-administered medications for this visit.    ALLERGIES:  No Known Allergies  PHYSICAL EXAM:  Performance status (ECOG): 1 - Symptomatic but completely ambulatory  There were no vitals filed for this visit. Wt Readings from Last 3 Encounters:  09/23/22 69 kg (152 lb 3.2 oz)  09/02/22 70.2 kg (154 lb 12.8 oz)  08/12/22 69.3 kg (152 lb 12.8 oz)   Physical Exam Vitals reviewed. Exam conducted with a chaperone present.  Constitutional:      Appearance: Normal appearance.  Cardiovascular:     Rate and Rhythm: Normal rate and regular rhythm.     Pulses: Normal pulses.     Heart sounds: Normal heart sounds.  Pulmonary:     Effort: Pulmonary effort is normal.     Breath sounds: Normal breath sounds.  Abdominal:     Palpations: Abdomen is soft. There is no hepatomegaly, splenomegaly or mass.     Tenderness: There is no abdominal tenderness.  Lymphadenopathy:     Upper Body:     Right upper body: No supraclavicular, axillary or pectoral adenopathy.     Left upper body: No supraclavicular, axillary or pectoral adenopathy.  Neurological:     General: No focal deficit present.     Mental Status: She is alert and oriented to person, place, and time.  Psychiatric:        Mood and Affect: Mood normal.        Behavior: Behavior normal.     LABORATORY DATA:  I have reviewed the labs as listed.     Latest Ref Rng & Units 09/23/2022    9:44 AM 09/02/2022    9:01 AM 08/12/2022    9:19 AM  CBC  WBC 4.0 - 10.5 K/uL 6.4  6.5  5.8   Hemoglobin 12.0 - 15.0 g/dL 11.8  11.9  11.3   Hematocrit 36.0 - 46.0 % 37.0  38.0  35.6   Platelets 150 - 400 K/uL 204  210  195        Latest Ref Rng & Units 09/02/2022    9:01 AM 08/12/2022    9:19 AM 07/22/2022   10:15 AM  CMP  Glucose 70 - 99 mg/dL 101  106  93   BUN 8 - 23 mg/dL 31  29  32   Creatinine 0.44 - 1.00 mg/dL 1.27  1.26  1.44   Sodium 135 - 145 mmol/L 136  138  139   Potassium 3.5 - 5.1 mmol/L 4.0  4.0  4.2   Chloride 98 - 111 mmol/L 106  106  107   CO2 22 - 32 mmol/L '23  23  24   '$ Calcium 8.9 - 10.3 mg/dL 9.6  10.0  9.9   Total Protein 6.5 - 8.1 g/dL 7.1  7.0  7.3   Total Bilirubin 0.3 - 1.2 mg/dL 0.5  0.6  0.4   Alkaline Phos 38 - 126 U/L 146  152  163   AST 15 - 41 U/L '17  15  15   '$ ALT 0 - 44 U/L '14  15  14     '$ DIAGNOSTIC IMAGING:  I have independently reviewed the scans and discussed with the patient. No results found.   ASSESSMENT:  Locally advanced HER2 positive left breast cancer: - Patient felt lump over the last 1 year. - She noticed lump has  gotten bigger and deformed since September 2022.  She has itching over the skin of the breast. - Left breast 4:00 biopsy: Invasive mammary carcinoma, grade 2, ER 100%, PR 70%, Ki-67 25%, HER2 FISH positive - Left breast 2:00 biopsy: Invasive ductal carcinoma, grade 2, ER 100%, PR 70%, Ki-67 25%, HER2 negative - Left axillary lymph node biopsy: IDC, ER 100%, PR 50%, HER2 negative, E-cadherin positive - Bilateral breast MRI on 10/09/2021: Multiple enhancing masses with intervening non-mass enhancement involving most of the LOQ and part of the UOQ.  Abnormal enhancement spans approximately 12.4 x 6.4 x 7 cm.  Diffuse skin thickening/edema.  Multiple left axillary lymph nodes, largest node measuring approximately 2.4 cm in the lower axilla.  Indeterminate non-mass enhancement inferior to the biopsy site in the subareolar right breast measuring 1.5 x 0.9 x 0.8 cm. - Right breast biopsy UOQ on 10/01/2021: Fibroadenomatoid nodule with calcifications, fibrocystic changes including apocrine metaplasia with no malignancy. -PET scan from 10/22/2021: Large left  breast mass and numerous hypermetabolic left axillary and subpectoral lymph nodes.  2 hypermetabolic left upper lobe lung nodules measuring 7 mm with SUV 4.2 and 3.2 respectively.  No other hypermetabolic lung nodules.  No distant metastatic disease.  Nonspecific low-level subareolar activity in the right breast near the surgical clips and a single mildly hypermetabolic right axillary lymph node. - Echocardiogram shows EF 60 to 65%. - She has oligometastatic disease with 2 small lung nodules.  Biopsy is not feasible.  I have recommended TCHP regimen followed by restaging after 3 cycles. -6 cycles of dose reduced TCHP from 11/04/2021 through 03/11/2022 - Maintenance Herceptin and pertuzumab started on 04/01/2022, anastrozole started on 04/01/2022 -Left mastectomy and lymph node dissection (05/05/2022): YPT3YPN2A, residual IDC, grade 2, discontinuously involving fibrotic area measuring 5.9 cm, margins negative, LVI positive, response to therapy seen including hyalin fibrosis and dystrophic calcifications.  9/18 lymph nodes positive, largest focus of metastatic carcinoma measures 1.2 cm with extranodal extension.  Right breast lumpectomy shows atypical lobular hyperplasia. - We discussed switching to Kadcyla in the adjuvant setting.  Related to efficacy of Kadcyla compared with pertuzumab and trastuzumab in the adjuvant setting is unknown.  Patient is reluctant to consider switching as she had poor tolerance with the prior chemotherapy.  Hence we have continued Herceptin and Perjeta.   Social/family history: - She lives at home with her husband and daughter.  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  Current active smoker, half pack per day since age 56. - Mother had cervical cancer and bladder cancer.  Maternal grandmother had ovarian cancer.  Brother died at age 49 from acute leukemia.  Another brother died of liver cancer.  Maternal half aunt had breast cancer.   PLAN:  Oligometastatic (two  7 mm lung lesions) HER2 positive left breast cancer: - CT CAP on 06/17/2022: Pulmonary nodules are stable.  No evidence of metastatic disease.  Minimal right adrenal nodularity is unchanged most likely benign.  Presacral soft tissue similar. - XRT to the chest wall started on 09/13/2022.  She is being planned for 5 to 6 weeks of treatment. - She reports occasional hot flashes and is tolerating anastrozole very well. - Reviewed labs today which showed normal LFTs with mildly elevated alk phos which is stable.  Creatinine is 1.46 and stable.  CBC was normal.  Last CA 15-3 was 26.8 and stable. - Proceed with treatment today and in 3 weeks.  Will obtain echocardiogram soon.  RTC 6 weeks for  follow-up with repeat tumor marker.   2.  Right facial shingles/postherpetic right hemifacial pain: - Continue gabapentin 300 mg 3 times daily.  She has occasional cramping in the hands.  3.  Hypomagnesemia: - Continue magnesium 2 tablets in the morning and 1 tablet in the evening.  Magnesium today is normal.  4.  Anxiety: - Continue Xanax 0.25 mg daily.  5.  Hypertension: - Continue amlodipine daily.  Blood pressure is well-controlled.  6.  Left leg DVT (diagnosed 03/04/2022): - Continue Eliquis twice daily.  No bleeding issues.  7.  Osteopenia (DEXA 04/19/2022 T score -2.1): - Continue calcium and vitamin D supplements.          Orders placed this encounter:  Orders Placed This Encounter  Procedures   ECHOCARDIOGRAM COMPLETE     I,Alexis Herring,acting as a scribe for Derek Jack, MD.,have documented all relevant documentation on the behalf of Derek Jack, MD,as directed by  Derek Jack, MD while in the presence of Derek Jack, MD.  I, Derek Jack MD, have reviewed the above documentation for accuracy and completeness, and I agree with the above.   Derek Jack, MD [facility] [phone]

## 2022-09-23 NOTE — Progress Notes (Signed)
Pt presents today for Kanjinti and Perjeta per provider's order. Vital signs and labs WNL for treatment. Dr.K stated okay to proceed with treatment without CMP.  Pt took pre-meds Tylenol and Benadryl at home prior to arrival.  Calla Kicks and [place] given today per MD orders. Tolerated infusion without adverse affects. Vital signs stable. No complaints at this time. Discharged from clinic ambulatory in stable condition. Alert and oriented x 3. F/U with [facility] as scheduled.

## 2022-09-23 NOTE — Progress Notes (Signed)
Patients port flushed without difficulty.  Good blood return noted with no bruising or swelling noted at site.  Stable during access and blood draw.  Patient to remain accessed for treatment. 

## 2022-09-24 ENCOUNTER — Other Ambulatory Visit: Payer: Self-pay

## 2022-09-24 LAB — CANCER ANTIGEN 27.29: CA 27.29: 27.3 U/mL (ref 0.0–38.6)

## 2022-09-25 LAB — CANCER ANTIGEN 15-3: CA 15-3: 29.4 U/mL — ABNORMAL HIGH (ref 0.0–25.0)

## 2022-09-30 ENCOUNTER — Encounter: Payer: Self-pay | Admitting: Hematology

## 2022-09-30 ENCOUNTER — Encounter (HOSPITAL_COMMUNITY): Payer: Self-pay | Admitting: Hematology

## 2022-09-30 ENCOUNTER — Other Ambulatory Visit: Payer: Self-pay | Admitting: *Deleted

## 2022-09-30 ENCOUNTER — Other Ambulatory Visit: Payer: Self-pay

## 2022-09-30 DIAGNOSIS — F419 Anxiety disorder, unspecified: Secondary | ICD-10-CM

## 2022-09-30 MED ORDER — ALPRAZOLAM 0.25 MG PO TABS: 0.2500 mg | ORAL_TABLET | Freq: Two times a day (BID) | ORAL | 2 refills | Status: DC | PRN

## 2022-10-07 ENCOUNTER — Encounter: Payer: Self-pay | Admitting: Hematology

## 2022-10-07 ENCOUNTER — Encounter (HOSPITAL_COMMUNITY): Payer: Self-pay | Admitting: Hematology

## 2022-10-13 ENCOUNTER — Other Ambulatory Visit: Payer: Self-pay

## 2022-10-13 DIAGNOSIS — Z170 Estrogen receptor positive status [ER+]: Secondary | ICD-10-CM

## 2022-10-14 ENCOUNTER — Inpatient Hospital Stay: Payer: Managed Care, Other (non HMO)

## 2022-10-14 VITALS — BP 124/69 | HR 63 | Temp 97.8°F | Resp 18

## 2022-10-14 DIAGNOSIS — C50812 Malignant neoplasm of overlapping sites of left female breast: Secondary | ICD-10-CM | POA: Diagnosis not present

## 2022-10-14 DIAGNOSIS — Z95828 Presence of other vascular implants and grafts: Secondary | ICD-10-CM

## 2022-10-14 DIAGNOSIS — Z170 Estrogen receptor positive status [ER+]: Secondary | ICD-10-CM

## 2022-10-14 LAB — CBC WITH DIFFERENTIAL/PLATELET
Abs Immature Granulocytes: 0.04 10*3/uL (ref 0.00–0.07)
Basophils Absolute: 0 10*3/uL (ref 0.0–0.1)
Basophils Relative: 1 %
Eosinophils Absolute: 0.2 10*3/uL (ref 0.0–0.5)
Eosinophils Relative: 4 %
HCT: 35.5 % — ABNORMAL LOW (ref 36.0–46.0)
Hemoglobin: 11.2 g/dL — ABNORMAL LOW (ref 12.0–15.0)
Immature Granulocytes: 1 %
Lymphocytes Relative: 10 %
Lymphs Abs: 0.5 10*3/uL — ABNORMAL LOW (ref 0.7–4.0)
MCH: 29.9 pg (ref 26.0–34.0)
MCHC: 31.5 g/dL (ref 30.0–36.0)
MCV: 94.9 fL (ref 80.0–100.0)
Monocytes Absolute: 0.4 10*3/uL (ref 0.1–1.0)
Monocytes Relative: 7 %
Neutro Abs: 4.4 10*3/uL (ref 1.7–7.7)
Neutrophils Relative %: 77 %
Platelets: 174 10*3/uL (ref 150–400)
RBC: 3.74 MIL/uL — ABNORMAL LOW (ref 3.87–5.11)
RDW: 16.1 % — ABNORMAL HIGH (ref 11.5–15.5)
WBC: 5.6 10*3/uL (ref 4.0–10.5)
nRBC: 0 % (ref 0.0–0.2)

## 2022-10-14 LAB — COMPREHENSIVE METABOLIC PANEL
ALT: 13 U/L (ref 0–44)
AST: 17 U/L (ref 15–41)
Albumin: 3.6 g/dL (ref 3.5–5.0)
Alkaline Phosphatase: 123 U/L (ref 38–126)
Anion gap: 7 (ref 5–15)
BUN: 31 mg/dL — ABNORMAL HIGH (ref 8–23)
CO2: 24 mmol/L (ref 22–32)
Calcium: 9.8 mg/dL (ref 8.9–10.3)
Chloride: 106 mmol/L (ref 98–111)
Creatinine, Ser: 1.34 mg/dL — ABNORMAL HIGH (ref 0.44–1.00)
GFR, Estimated: 44 mL/min — ABNORMAL LOW (ref >60)
Glucose, Bld: 99 mg/dL (ref 70–99)
Potassium: 4.3 mmol/L (ref 3.5–5.1)
Sodium: 137 mmol/L (ref 135–145)
Total Bilirubin: 0.4 mg/dL (ref 0.3–1.2)
Total Protein: 6.8 g/dL (ref 6.5–8.1)

## 2022-10-14 LAB — MAGNESIUM: Magnesium: 1.7 mg/dL (ref 1.7–2.4)

## 2022-10-14 MED ORDER — ACETAMINOPHEN 325 MG PO TABS: 650.0000 mg | ORAL_TABLET | Freq: Once | ORAL | Status: DC

## 2022-10-14 MED ORDER — SODIUM CHLORIDE 0.9% FLUSH
10.0000 mL | INTRAVENOUS | Status: DC | PRN
  Administered 2022-10-14: 10 mL

## 2022-10-14 MED ORDER — CETIRIZINE HCL 10 MG PO TABS: 10.0000 mg | ORAL_TABLET | Freq: Once | ORAL | Status: DC

## 2022-10-14 MED ORDER — SODIUM CHLORIDE 0.9 % IV SOLN
420.0000 mg | Freq: Once | INTRAVENOUS | Status: AC
  Administered 2022-10-14: 420 mg via INTRAVENOUS
  Filled 2022-10-14: qty 14

## 2022-10-14 MED ORDER — SODIUM CHLORIDE 0.9 % IV SOLN: Freq: Once | INTRAVENOUS | Status: AC

## 2022-10-14 MED ORDER — TRASTUZUMAB-ANNS CHEMO 420 MG IV SOLR
6.0000 mg/kg | Freq: Once | INTRAVENOUS | Status: AC
  Administered 2022-10-14: 420 mg via INTRAVENOUS
  Filled 2022-10-14: qty 20

## 2022-10-14 MED ORDER — HEPARIN SOD (PORK) LOCK FLUSH 100 UNIT/ML IV SOLN
500.0000 [IU] | Freq: Once | INTRAVENOUS | Status: AC | PRN
  Administered 2022-10-14: 500 [IU]

## 2022-10-14 NOTE — Patient Instructions (Signed)
[facility]  Discharge Instructions: Thank you for choosing [facility] to provide your oncology and hematology care.  If you have a lab appointment with the [facility], please come in thru the Main Entrance and check in at the main information desk.  Wear comfortable clothing and clothing appropriate for easy access to any Portacath or PICC line.   We strive to give you quality time with your provider. You may need to reschedule your appointment if you arrive late (15 or more minutes).  Arriving late affects you and other patients whose appointments are after yours.  Also, if you miss three or more appointments without notifying the office, you may be dismissed from the clinic at the provider's discretion.      For prescription refill requests, have your pharmacy contact our office and allow 72 hours for refills to be completed.    Today you received the following chemotherapy and/or immunotherapy agents Kanjinti/Perjeta      To help prevent nausea and vomiting after your treatment, we encourage you to take your nausea medication as directed.  BELOW ARE SYMPTOMS THAT SHOULD BE REPORTED IMMEDIATELY: *FEVER GREATER THAN 100.4 F (38 C) OR HIGHER *CHILLS OR SWEATING *NAUSEA AND VOMITING THAT IS NOT CONTROLLED WITH YOUR NAUSEA MEDICATION *UNUSUAL SHORTNESS OF BREATH *UNUSUAL BRUISING OR BLEEDING *URINARY PROBLEMS (pain or burning when urinating, or frequent urination) *BOWEL PROBLEMS (unusual diarrhea, constipation, pain near the anus) TENDERNESS IN MOUTH AND THROAT WITH OR WITHOUT PRESENCE OF ULCERS (sore throat, sores in mouth, or a toothache) UNUSUAL RASH, SWELLING OR PAIN  UNUSUAL VAGINAL DISCHARGE OR ITCHING   Items with * indicate a potential emergency and should be followed up as soon as possible or go to the Emergency Department if any problems should occur.  Please show the CHEMOTHERAPY ALERT CARD or IMMUNOTHERAPY ALERT CARD at check-in to the  Emergency Department and triage nurse.  Should you have questions after your visit or need to cancel or reschedule your appointment, please contact [facility] [phone]  and follow the prompts.  Office hours are 8:00 a.m. to 4:30 p.m. Monday - Friday. Please note that voicemails left after 4:00 p.m. may not be returned until the following business day.  We are closed weekends and major holidays. You have access to a nurse at all times for urgent questions. Please call the main number to the clinic [phone] and follow the prompts.  For any non-urgent questions, you may also contact your provider using MyChart. We now offer e-Visits for anyone 34 and older to request care online for non-urgent symptoms. For details visit [email or web address].   Also download the MyChart app! Go to the app store, search "MyChart", open the app, select [facility], and log in with your MyChart username and password.

## 2022-10-14 NOTE — Progress Notes (Signed)
Patient presents today for Kanjinti/Perjeta infusions per providers orders.  Vital signs and labs within parameters for treatment.  Patient took premedications at home prior to arrival at the clinic.  Patient has no new complaints at this time.   Treatment given today per MD orders.  Stable during infusion without adverse affects.  Vital signs stable.  No complaints at this time.  Discharge from clinic ambulatory in stable condition.  Alert and oriented X 3.  Follow up with [facility] as scheduled.

## 2022-10-15 ENCOUNTER — Ambulatory Visit (HOSPITAL_COMMUNITY)
Admission: RE | Admit: 2022-10-15 | Discharge: 2022-10-15 | Disposition: A | Discharge status: Home / Self Care | Payer: Managed Care, Other (non HMO) | Source: Ambulatory Visit | Attending: Hematology | Admitting: Hematology

## 2022-10-15 DIAGNOSIS — Z0189 Encounter for other specified special examinations: Secondary | ICD-10-CM

## 2022-10-15 DIAGNOSIS — Z170 Estrogen receptor positive status [ER+]: Secondary | ICD-10-CM | POA: Diagnosis present

## 2022-10-15 DIAGNOSIS — C50812 Malignant neoplasm of overlapping sites of left female breast: Secondary | ICD-10-CM | POA: Diagnosis present

## 2022-10-15 LAB — ECHOCARDIOGRAM COMPLETE
Area-P 1/2: 3.17 cm2
S' Lateral: 3 cm

## 2022-10-15 NOTE — Progress Notes (Signed)
*  PRELIMINARY RESULTS* Echocardiogram 2D Echocardiogram has been performed.  Traci Fuller 10/15/2022, 12:54 PM

## 2022-10-26 ENCOUNTER — Encounter: Payer: Self-pay | Admitting: *Deleted

## 2022-10-26 NOTE — Progress Notes (Signed)
Eliquis 5 mg tabs approved by plan 10/26/22 and is effective through 10/26/23.

## 2022-10-28 ENCOUNTER — Encounter (HOSPITAL_COMMUNITY): Payer: Self-pay | Admitting: Hematology

## 2022-10-28 ENCOUNTER — Encounter: Payer: Self-pay | Admitting: Hematology

## 2022-11-03 ENCOUNTER — Other Ambulatory Visit: Payer: Self-pay

## 2022-11-03 DIAGNOSIS — Z170 Estrogen receptor positive status [ER+]: Secondary | ICD-10-CM

## 2022-11-04 ENCOUNTER — Inpatient Hospital Stay: Payer: Managed Care, Other (non HMO) | Attending: Oncology | Admitting: Hematology

## 2022-11-04 ENCOUNTER — Inpatient Hospital Stay: Payer: Managed Care, Other (non HMO)

## 2022-11-04 ENCOUNTER — Telehealth: Payer: Self-pay

## 2022-11-04 VITALS — BP 116/66 | HR 67 | Temp 97.3°F | Resp 18

## 2022-11-04 VITALS — BP 107/65 | HR 65 | Temp 98.7°F | Resp 18 | Ht 60.0 in | Wt 152.0 lb

## 2022-11-04 DIAGNOSIS — Z95828 Presence of other vascular implants and grafts: Secondary | ICD-10-CM

## 2022-11-04 DIAGNOSIS — Z79811 Long term (current) use of aromatase inhibitors: Secondary | ICD-10-CM | POA: Diagnosis not present

## 2022-11-04 DIAGNOSIS — C50812 Malignant neoplasm of overlapping sites of left female breast: Secondary | ICD-10-CM | POA: Diagnosis present

## 2022-11-04 DIAGNOSIS — C50919 Malignant neoplasm of unspecified site of unspecified female breast: Secondary | ICD-10-CM

## 2022-11-04 DIAGNOSIS — Z9221 Personal history of antineoplastic chemotherapy: Secondary | ICD-10-CM

## 2022-11-04 DIAGNOSIS — Z9012 Acquired absence of left breast and nipple: Secondary | ICD-10-CM

## 2022-11-04 DIAGNOSIS — Z170 Estrogen receptor positive status [ER+]: Secondary | ICD-10-CM

## 2022-11-04 DIAGNOSIS — Z5112 Encounter for antineoplastic immunotherapy: Secondary | ICD-10-CM | POA: Diagnosis present

## 2022-11-04 DIAGNOSIS — Z79899 Other long term (current) drug therapy: Secondary | ICD-10-CM

## 2022-11-04 LAB — CBC WITH DIFFERENTIAL/PLATELET
Abs Immature Granulocytes: 0.05 10*3/uL (ref 0.00–0.07)
Basophils Absolute: 0 10*3/uL (ref 0.0–0.1)
Basophils Relative: 1 %
Eosinophils Absolute: 0.3 10*3/uL (ref 0.0–0.5)
Eosinophils Relative: 4 %
HCT: 32.3 % — ABNORMAL LOW (ref 36.0–46.0)
Hemoglobin: 10.5 g/dL — ABNORMAL LOW (ref 12.0–15.0)
Immature Granulocytes: 1 %
Lymphocytes Relative: 8 %
Lymphs Abs: 0.5 10*3/uL — ABNORMAL LOW (ref 0.7–4.0)
MCH: 30.8 pg (ref 26.0–34.0)
MCHC: 32.5 g/dL (ref 30.0–36.0)
MCV: 94.7 fL (ref 80.0–100.0)
Monocytes Absolute: 0.6 10*3/uL (ref 0.1–1.0)
Monocytes Relative: 9 %
Neutro Abs: 5.2 10*3/uL (ref 1.7–7.7)
Neutrophils Relative %: 77 %
Platelets: 208 10*3/uL (ref 150–400)
RBC: 3.41 MIL/uL — ABNORMAL LOW (ref 3.87–5.11)
RDW: 16.4 % — ABNORMAL HIGH (ref 11.5–15.5)
WBC: 6.6 10*3/uL (ref 4.0–10.5)
nRBC: 0 % (ref 0.0–0.2)

## 2022-11-04 LAB — COMPREHENSIVE METABOLIC PANEL
ALT: 13 U/L (ref 0–44)
AST: 15 U/L (ref 15–41)
Albumin: 3.5 g/dL (ref 3.5–5.0)
Alkaline Phosphatase: 140 U/L — ABNORMAL HIGH (ref 38–126)
Anion gap: 9 (ref 5–15)
BUN: 34 mg/dL — ABNORMAL HIGH (ref 8–23)
CO2: 22 mmol/L (ref 22–32)
Calcium: 9.3 mg/dL (ref 8.9–10.3)
Chloride: 102 mmol/L (ref 98–111)
Creatinine, Ser: 1.51 mg/dL — ABNORMAL HIGH (ref 0.44–1.00)
GFR, Estimated: 38 mL/min — ABNORMAL LOW (ref >60)
Glucose, Bld: 104 mg/dL — ABNORMAL HIGH (ref 70–99)
Potassium: 4.3 mmol/L (ref 3.5–5.1)
Sodium: 133 mmol/L — ABNORMAL LOW (ref 135–145)
Total Bilirubin: 0.6 mg/dL (ref 0.3–1.2)
Total Protein: 7 g/dL (ref 6.5–8.1)

## 2022-11-04 LAB — MAGNESIUM: Magnesium: 1.7 mg/dL (ref 1.7–2.4)

## 2022-11-04 MED ORDER — SODIUM CHLORIDE 0.9% FLUSH
10.0000 mL | INTRAVENOUS | Status: DC | PRN
  Administered 2022-11-04: 10 mL

## 2022-11-04 MED ORDER — SODIUM CHLORIDE 0.9 % IV SOLN
420.0000 mg | Freq: Once | INTRAVENOUS | Status: AC
  Administered 2022-11-04: 420 mg via INTRAVENOUS
  Filled 2022-11-04: qty 14

## 2022-11-04 MED ORDER — SODIUM CHLORIDE 0.9 % IV SOLN: Freq: Once | INTRAVENOUS | Status: AC

## 2022-11-04 MED ORDER — HEPARIN SOD (PORK) LOCK FLUSH 100 UNIT/ML IV SOLN
500.0000 [IU] | Freq: Once | INTRAVENOUS | Status: AC | PRN
  Administered 2022-11-04: 500 [IU]

## 2022-11-04 MED ORDER — TRASTUZUMAB-ANNS CHEMO 420 MG IV SOLR
6.0000 mg/kg | Freq: Once | INTRAVENOUS | Status: AC
  Administered 2022-11-04: 420 mg via INTRAVENOUS
  Filled 2022-11-04: qty 20

## 2022-11-04 MED ORDER — ACETAMINOPHEN 325 MG PO TABS
650.0000 mg | ORAL_TABLET | Freq: Once | ORAL | Status: DC
  Filled 2022-11-04: qty 2

## 2022-11-04 MED ORDER — SODIUM CHLORIDE 0.9% FLUSH
10.0000 mL | Freq: Once | INTRAVENOUS | Status: AC
  Administered 2022-11-04: 10 mL via INTRAVENOUS

## 2022-11-04 MED ORDER — CETIRIZINE HCL 10 MG PO TABS
10.0000 mg | ORAL_TABLET | Freq: Once | ORAL | Status: DC
  Filled 2022-11-04: qty 1

## 2022-11-04 NOTE — Progress Notes (Signed)
Patient has been examined by Dr. Delton Coombes. Vital signs and labs have been reviewed by MD - ANC, Creatinine (1.51), LFTs, hemoglobin, and platelets are within treatment parameters per M.D. - pt may proceed with treatment.  Primary RN and pharmacy notified.

## 2022-11-04 NOTE — Progress Notes (Signed)
Patient presents today for treatment Calla Kicks, Perjeta) and follow up visit with Dr. Delton Coombes. Labs and vital signs within parameters for treatment.   Message received from A. Anderson RN / Dr. Delton Coombes to proceed with treatment and infuse 500 mls of normal saline over 30 minutes with treatment.   Treatment given today per MD orders. Tolerated infusion without adverse affects. Vital signs stable. No complaints at this time. Discharged from clinic ambulatory in stable condition. Alert and oriented x 3. F/U with [facility] as scheduled.

## 2022-11-04 NOTE — Telephone Encounter (Signed)
Pharmacy Patient Advocate Encounter   Received notification from [facility] that prior authorization for Repatha '140mg'$ /ml is required/requested.     PA submitted on 3.14.24 to (ins) [employer] via CoverMyMeds Key confirmation # [id number]  Status is pending

## 2022-11-04 NOTE — Progress Notes (Signed)
[facility] [address], [place] [address]   CLINIC:  Medical Oncology/Hematology  PCP:  Celene Squibb, MD [address] [name] [address] [phone]   REASON FOR VISIT:  Follow-up for locally advanced left breast cancer  PRIOR THERAPY: TCHP x 6 cycles.  NGS Results: not done  CURRENT THERAPY: Anastrozole daily, trastuzumab and Herceptin every 21 days.   BRIEF ONCOLOGIC HISTORY:  Oncology History  Breast cancer, left ([place])  10/15/2021 Initial Diagnosis   Breast cancer, left ([place])   11/04/2021 - 04/22/2022 Chemotherapy   Patient is on Treatment Plan : BREAST  Docetaxel + Carboplatin + Trastuzumab + Pertuzumab  (TCHP) q21d       Genetic Testing   Negative genetic testing. No pathogenic variants identified on the [facility] Multi-Cancer+RNA panel. The report date is 11/02/2021.   The Multi-Cancer Panel + RNA offered by [facility] includes sequencing and/or deletion duplication testing of the following 84 genes: AIP, ALK, APC, ATM, AXIN2,BAP1,  BARD1, BLM, BMPR1A, BRCA1, BRCA2, BRIP1, CASR, CDC73, CDH1, CDK4, CDKN1B, CDKN1C, CDKN2A (p14ARF), CDKN2A (p16INK4a), CEBPA, CHEK2, CTNNA1, DICER1, DIS3L2, EGFR (c.2369C>T, p.Thr790Met variant only), EPCAM (Deletion/duplication testing only), FH, FLCN, GATA2, GPC3, GREM1 (Promoter region deletion/duplication testing only), HOXB13 (c.251G>A, p.Gly84Glu), HRAS, KIT, MAX, MEN1, MET, MITF (c.952G>A, p.Glu318Lys variant only), MLH1, MSH2, MSH3, MSH6, MUTYH, NBN, NF1, NF2, NTHL1, PALB2, PDGFRA, PHOX2B, PMS2, POLD1, POLE, POT1, PRKAR1A, PTCH1, PTEN, RAD50, RAD51C, RAD51D, RB1, RECQL4, RET, RUNX1, SDHAF2, SDHA (sequence changes only), SDHB, SDHC, SDHD, SMAD4, SMARCA4, SMARCB1, SMARCE1, STK11, SUFU, TERC, TERT, TMEM127, TP53, TSC1, TSC2, VHL, WRN and WT1.   11/04/2021 -  Chemotherapy   Patient is on Treatment Plan : BREAST  Docetaxel + Carboplatin + Trastuzumab + Pertuzumab  (TCHP) q21d / Trastuzumab + Pertuzumab q21d        CANCER STAGING:  Cancer Staging  Breast cancer, left ([place]) Staging form: Breast, AJCC 8th Edition - Clinical stage from 10/15/2021: Stage IIA (cT3, cN1, cM0, G2, ER+, PR+, HER2+) - Unsigned   INTERVAL HISTORY:  Ms. MARYELLA SIEGMUND, a 67 y.o. female, seen for follow-up of HER2 positive breast cancer.  She is tolerating anastrozole very well.  Denies any signs or symptoms of PND or orthopnea.  She is having some cough from allergies.  She has some burning on urination if she does not drink enough water.  REVIEW OF SYSTEMS:  Review of Systems  Constitutional:  Negative for appetite change, chills, fatigue and fever.  HENT:   Negative for lump/mass, mouth sores, nosebleeds, sore throat and trouble swallowing.   Eyes:  Negative for eye problems.  Respiratory:  Positive for cough.   Cardiovascular:  Negative for chest pain, leg swelling and palpitations.  Gastrointestinal:  Negative for abdominal pain, constipation, diarrhea, nausea and vomiting.  Genitourinary:  Negative for bladder incontinence, difficulty urinating, dysuria, frequency, hematuria and nocturia.   Musculoskeletal:  Positive for myalgias (mild, occasional hand cramps). Negative for arthralgias, back pain, flank pain and neck pain.  Skin:  Negative for itching, rash and wound.  Neurological:  Positive for headaches. Negative for dizziness and numbness.  Hematological:  Does not bruise/bleed easily.  Psychiatric/Behavioral:  Negative for depression, sleep disturbance and suicidal ideas. The patient is not nervous/anxious.   All other systems reviewed and are negative.   PAST MEDICAL/SURGICAL HISTORY:  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 DES to midLAD, 75% stenosis of RCA not treated  Cancer [facility])    Current smoker    Essential hypertension    Family history of bladder cancer    Family history of breast cancer    Family history of leukemia    Family  history of uterine cancer    Hyperlipidemia LDL goal <70    Myocardial infarction ([place])    PONV (postoperative nausea and vomiting)    Port-A-Cath in place 10/29/2021   Past Surgical History:  Procedure Laterality Date   ABDOMINAL HYSTERECTOMY     BREAST BIOPSY Bilateral 10/16/2021   BREAST BIOPSY WITH RADIO FREQUENCY LOCALIZER Right 05/05/2022   Procedure: BREAST BIOPSY WITH RADIO FREQUENCY LOCALIZER;  Surgeon: Virl Cagey, MD;  Location: [facility];  Service: General;  Laterality: Right;   CORONARY STENT INTERVENTION N/A 01/10/2018   Procedure: CORONARY STENT INTERVENTION;  Surgeon: Jettie Booze, MD;  Location: [facility];  Service: Cardiovascular;  Laterality: N/A;   LEFT HEART CATH AND CORONARY ANGIOGRAPHY N/A 01/10/2018   Procedure: LEFT HEART CATH AND CORONARY ANGIOGRAPHY;  Surgeon: Jettie Booze, MD;  Location: [facility] [facility];  Service: Cardiovascular;  Laterality: N/A;   MASTECTOMY MODIFIED RADICAL Left 05/05/2022   Procedure: MASTECTOMY MODIFIED RADICAL WITH RADIO FREQUENCY LOCALIZER;  Surgeon: Virl Cagey, MD;  Location: [facility];  Service: General;  Laterality: Left;   PORTACATH PLACEMENT Right 10/26/2021   Procedure: INSERTION PORT-A-CATH;  Surgeon: Virl Cagey, MD;  Location: [facility];  Service: General;  Laterality: Right;    SOCIAL HISTORY:  Social History   Socioeconomic History   Marital status: Married    Spouse name: Not on file   Number of children: Not on file   Years of education: Not on file   Highest education level: Not on file  Occupational History   Not on file  Tobacco Use   Smoking status: Every Day    Packs/day: 1.00    Years: 43.00    Additional pack years: 0.00    Total pack years: 43.00    Types: Cigarettes    Start date: 1976   Smokeless tobacco: Never  Vaping Use   Vaping Use: Never used  Substance and Sexual Activity   Alcohol use: Not Currently   Drug use: Never   Sexual activity: Yes  Other Topics  Concern   Not on file  Social History Narrative   Not on file   Social Determinants of Health   Financial Resource Strain: Not on file  Food Insecurity: No Food Insecurity (05/05/2022)   Hunger Vital Sign    Worried About Running Out of Food in the Last Year: Never true    Ran Out of Food in the Last Year: Never true  Transportation Needs: No Transportation Needs (05/05/2022)   PRAPARE - Hydrologist (Medical): No    Lack of Transportation (Non-Medical): No  Physical Activity: Not on file  Stress: Not on file  Social Connections: Not on file  Intimate Partner Violence: Not At Risk (05/05/2022)   Humiliation, Afraid, Rape, and Kick questionnaire    Fear of Current or Ex-Partner: No    Emotionally Abused: No    Physically Abused: No    Sexually Abused: No    FAMILY HISTORY:  Family History  Problem Relation Age of Onset   Bladder Cancer Mother 97   Cervical cancer Mother        dx 76s, hysterectomy   Hypertension Father    Cirrhosis Father  Leukemia Brother 27   Liver cancer Brother 68   Breast cancer Maternal Aunt        dx 17s   Cancer Maternal Uncle        unk type d. 1   Cancer Paternal Aunt        unk type   Breast cancer Maternal Grandmother        dx 21s   Uterine cancer Maternal Grandmother     CURRENT MEDICATIONS:  Current Outpatient Medications  Medication Sig Dispense Refill   ALPRAZolam (XANAX) 0.25 MG tablet Take 1 tablet (0.25 mg total) by mouth 2 (two) times daily as needed for anxiety. 60 tablet 2   amLODipine (NORVASC) 10 MG tablet Take 10 mg by mouth daily.     anastrozole (ARIMIDEX) 1 MG tablet Take 1 tablet (1 mg total) by mouth daily. 90 tablet 4   aspirin EC 81 MG tablet Take 1 tablet (81 mg total) by mouth daily. 90 tablet 3   Calcium Carb-Cholecalciferol (CALCIUM 600/VITAMIN D3 PO) Take 1 tablet by mouth in the morning and at bedtime.     CARBOPLATIN IV Inject into the vein every 21 ( twenty-one) days.      DOCEtaxel (TAXOTERE IV) Inject into the vein every 21 ( twenty-one) days.     ELIQUIS 5 MG TABS tablet TAKE 1 TABLET(5 MG) BY MOUTH TWICE DAILY 60 tablet 5   Evolocumab (REPATHA SURECLICK) [id number] MG/ML SOAJ Inject 140 mg into the skin every 14 (fourteen) days. 2 mL 11   gabapentin (NEURONTIN) 300 MG capsule Take 300 mg by mouth 2 (two) times daily.     magnesium oxide (MAG-OX) 400 (240 Mg) MG tablet Take 1 tablet (400 mg total) by mouth in the morning, at noon, and at bedtime. 90 tablet 4   metoprolol tartrate (LOPRESSOR) 25 MG tablet Take 1 tablet (25 mg total) by mouth 2 (two) times daily. Please call and schedule overdue appointment with Dr. Gwenlyn Found for further refills . SECOND ATTEMPT! 30 tablet 0   mometasone (ELOCON) 0.1 % cream Apply topically 2 (two) times daily.     omeprazole (PRILOSEC OTC) 20 MG tablet Take 20 mg by mouth daily.     oxyCODONE (OXY IR/ROXICODONE) 5 MG immediate release tablet Take 1-2 tablets (5-10 mg total) by mouth every 4 (four) hours as needed for severe pain or breakthrough pain. 30 tablet 0   pegfilgrastim-bmez (ZIEXTENZO) 6 MG/0.6ML injection Inject 6 mg into the skin every 21 ( twenty-one) days. AFTER chemotherapy.     Pertuzumab (PERJETA IV) Inject into the vein every 21 ( twenty-one) days.     trastuzumab-anns (KANJINTI) 150 MG SOLR injection Inject 462 mg into the vein every 21 ( twenty-one) days.     ondansetron (ZOFRAN-ODT) 4 MG disintegrating tablet Take 1 tablet (4 mg total) by mouth every 6 (six) hours as needed for nausea. (Patient not taking: Reported on 11/04/2022) 20 tablet 0   No current facility-administered medications for this visit.    ALLERGIES:  No Known Allergies  PHYSICAL EXAM:  Performance status (ECOG): 1 - Symptomatic but completely ambulatory  There were no vitals filed for this visit. Wt Readings from Last 3 Encounters:  11/04/22 152 lb (68.9 kg)  10/14/22 155 lb 9.6 oz (70.6 kg)  09/23/22 152 lb 3.2 oz (69 kg)   Physical  Exam Vitals reviewed. Exam conducted with a chaperone present.  Constitutional:      Appearance: Normal appearance.  Cardiovascular:     Rate and  Rhythm: Normal rate and regular rhythm.     Pulses: Normal pulses.     Heart sounds: Normal heart sounds.  Pulmonary:     Effort: Pulmonary effort is normal.     Breath sounds: Normal breath sounds.  Abdominal:     Palpations: Abdomen is soft. There is no hepatomegaly, splenomegaly or mass.     Tenderness: There is no abdominal tenderness.  Lymphadenopathy:     Upper Body:     Right upper body: No supraclavicular, axillary or pectoral adenopathy.     Left upper body: No supraclavicular, axillary or pectoral adenopathy.  Neurological:     General: No focal deficit present.     Mental Status: She is alert and oriented to person, place, and time.  Psychiatric:        Mood and Affect: Mood normal.        Behavior: Behavior normal.    LABORATORY DATA:  I have reviewed the labs as listed.     Latest Ref Rng & Units 11/04/2022   11:44 AM 10/14/2022    9:51 AM 09/23/2022    9:44 AM  CBC  WBC 4.0 - 10.5 K/uL 6.6  5.6  6.4   Hemoglobin 12.0 - 15.0 g/dL 10.5  11.2  11.8   Hematocrit 36.0 - 46.0 % 32.3  35.5  37.0   Platelets 150 - 400 K/uL 208  174  204       Latest Ref Rng & Units 11/04/2022   11:44 AM 10/14/2022    9:51 AM 09/23/2022    9:44 AM  CMP  Glucose 70 - 99 mg/dL 104  99  111   BUN 8 - 23 mg/dL 34  31  38   Creatinine 0.44 - 1.00 mg/dL 1.51  1.34  1.46   Sodium 135 - 145 mmol/L 133  137  135   Potassium 3.5 - 5.1 mmol/L 4.3  4.3  4.6   Chloride 98 - 111 mmol/L 102  106  104   CO2 22 - 32 mmol/L '22  24  22   '$ Calcium 8.9 - 10.3 mg/dL 9.3  9.8  10.0   Total Protein 6.5 - 8.1 g/dL 7.0  6.8  7.2   Total Bilirubin 0.3 - 1.2 mg/dL 0.6  0.4  0.5   Alkaline Phos 38 - 126 U/L 140  123  142   AST 15 - 41 U/L '15  17  16   '$ ALT 0 - 44 U/L '13  13  13     '$ DIAGNOSTIC IMAGING:  I have independently reviewed the scans and discussed with  the patient. ECHOCARDIOGRAM COMPLETE  Result Date: 10/15/2022    ECHOCARDIOGRAM REPORT   Patient Name:   DALISA KUEHLER Date of Exam: 10/15/2022 Medical Rec #:  [id number]            Height:       60.0 in Accession #:    [id number]           Weight:       155.6 lb Date of Birth:  02-19-1956            BSA:          1.678 m Patient Age:    67 years             BP:           127/74 mmHg Patient Gender: F  HR:           78 bpm. Exam Location:  [facility] Procedure: 2D Echo, Cardiac Doppler and Color Doppler Indications:    C50.812,Z17.0 (ICD-10-CM) - Malignant neoplasm of overlapping                 sites of left breast in female, estrogen receptor positive  History:        Patient has prior history of Echocardiogram examinations, most                 recent 07/12/2022. CAD and Previous Myocardial Infarction; Risk                 Factors:Hypertension, Dyslipidemia and Former Smoker. Left                 breast cancer. Metastatic breast cancer. S/P percutaneous                 coronary angioplasty.  Sonographer:    Alvino Chapel RCS Referring Phys: [phone] [place]  1. Left ventricular ejection fraction, by estimation, is 55 to 60%. The left ventricle has normal function. The left ventricle has no regional wall motion abnormalities. Left ventricular diastolic parameters are consistent with Grade I diastolic dysfunction (impaired relaxation).  2. Right ventricular systolic function was not well visualized. The right ventricular size is normal. There is normal pulmonary artery systolic pressure. The estimated right ventricular systolic pressure is 0000000 mmHg.  3. Left atrial size was mildly dilated.  4. The mitral valve is normal in structure. No evidence of mitral valve regurgitation. No evidence of mitral stenosis.  5. The aortic valve has an indeterminant number of cusps. Aortic valve regurgitation is trivial. No aortic stenosis is present.  6. The inferior vena cava is  normal in size with greater than 50% respiratory variability, suggesting right atrial pressure of 3 mmHg. Comparison(s): No significant change from prior study. FINDINGS  Left Ventricle: Left ventricular ejection fraction, by estimation, is 55 to 60%. The left ventricle has normal function. The left ventricle has no regional wall motion abnormalities. The left ventricular internal cavity size was normal in size. There is  no left ventricular hypertrophy. Left ventricular diastolic parameters are consistent with Grade I diastolic dysfunction (impaired relaxation). Right Ventricle: The right ventricular size is normal. No increase in right ventricular wall thickness. Right ventricular systolic function was not well visualized. There is normal pulmonary artery systolic pressure. The tricuspid regurgitant velocity is  2.49 m/s, and with an assumed right atrial pressure of 3 mmHg, the estimated right ventricular systolic pressure is 0000000 mmHg. Left Atrium: Left atrial size was mildly dilated. Right Atrium: Right atrial size was normal in size. Pericardium: There is no evidence of pericardial effusion. Mitral Valve: The mitral valve is normal in structure. No evidence of mitral valve regurgitation. No evidence of mitral valve stenosis. Tricuspid Valve: The tricuspid valve is not well visualized. Tricuspid valve regurgitation is mild . No evidence of tricuspid stenosis. Aortic Valve: The aortic valve has an indeterminant number of cusps. Aortic valve regurgitation is trivial. No aortic stenosis is present. Pulmonic Valve: The pulmonic valve was not well visualized. Pulmonic valve regurgitation is trivial. No evidence of pulmonic stenosis. Aorta: The aortic root is normal in size and structure. Venous: The inferior vena cava is normal in size with greater than 50% respiratory variability, suggesting right atrial pressure of 3 mmHg. IAS/Shunts: No atrial level shunt detected by color flow Doppler.  LEFT VENTRICLE PLAX  2D  LVIDd:         4.20 cm   Diastology LVIDs:         3.00 cm   LV e' medial:    6.20 cm/s LV PW:         1.00 cm   LV E/e' medial:  11.0 LV IVS:        1.00 cm   LV e' lateral:   9.57 cm/s LVOT diam:     1.90 cm   LV E/e' lateral: 7.1 LV SV:         84 LV SV Index:   50 LVOT Area:     2.84 cm  RIGHT VENTRICLE RV S prime:     9.79 cm/s TAPSE (M-mode): 1.9 cm LEFT ATRIUM             Index        RIGHT ATRIUM           Index LA diam:        3.80 cm 2.26 cm/m   RA Area:     14.50 cm LA Vol (A2C):   66.4 ml 39.58 ml/m  RA Volume:   35.40 ml  21.10 ml/m LA Vol (A4C):   54.5 ml 32.48 ml/m LA Biplane Vol: 61.9 ml 36.90 ml/m  AORTIC VALVE LVOT Vmax:   147.00 cm/s LVOT Vmean:  93.600 cm/s LVOT VTI:    0.298 m  AORTA Ao Root diam: 3.30 cm MITRAL VALVE               TRICUSPID VALVE MV Area (PHT): 3.17 cm    TR Peak grad:   24.8 mmHg MV Decel Time: 239 msec    TR Vmax:        249.00 cm/s MV E velocity: 67.90 cm/s MV A velocity: 69.60 cm/s  SHUNTS MV E/A ratio:  0.98        Systemic VTI:  0.30 m                            Systemic Diam: 1.90 cm Vishnu Priya Mallipeddi Electronically signed by Lorelee Cover Mallipeddi Signature Date/Time: 10/15/2022/1:22:51 PM    Final      ASSESSMENT:  Locally advanced HER2 positive left breast cancer: - Patient felt lump over the last 1 year. - She noticed lump has gotten bigger and deformed since September 2022.  She has itching over the skin of the breast. - Left breast 4:00 biopsy: Invasive mammary carcinoma, grade 2, ER 100%, PR 70%, Ki-67 25%, HER2 FISH positive - Left breast 2:00 biopsy: Invasive ductal carcinoma, grade 2, ER 100%, PR 70%, Ki-67 25%, HER2 negative - Left axillary lymph node biopsy: IDC, ER 100%, PR 50%, HER2 negative, E-cadherin positive - Bilateral breast MRI on 10/09/2021: Multiple enhancing masses with intervening non-mass enhancement involving most of the LOQ and part of the UOQ.  Abnormal enhancement spans approximately 12.4 x 6.4 x 7 cm.  Diffuse skin  thickening/edema.  Multiple left axillary lymph nodes, largest node measuring approximately 2.4 cm in the lower axilla.  Indeterminate non-mass enhancement inferior to the biopsy site in the subareolar right breast measuring 1.5 x 0.9 x 0.8 cm. - Right breast biopsy UOQ on 10/01/2021: Fibroadenomatoid nodule with calcifications, fibrocystic changes including apocrine metaplasia with no malignancy. -PET scan from 10/22/2021: Large left breast mass and numerous hypermetabolic left axillary and subpectoral lymph nodes.  2 hypermetabolic left upper lobe lung nodules measuring 7  mm with SUV 4.2 and 3.2 respectively.  No other hypermetabolic lung nodules.  No distant metastatic disease.  Nonspecific low-level subareolar activity in the right breast near the surgical clips and a single mildly hypermetabolic right axillary lymph node. - Echocardiogram shows EF 60 to 65%. - She has oligometastatic disease with 2 small lung nodules.  Biopsy is not feasible.  I have recommended TCHP regimen followed by restaging after 3 cycles. -6 cycles of dose reduced TCHP from 11/04/2021 through 03/11/2022 - Maintenance Herceptin and pertuzumab started on 04/01/2022, anastrozole started on 04/01/2022 -Left mastectomy and lymph node dissection (05/05/2022): YPT3YPN2A, residual IDC, grade 2, discontinuously involving fibrotic area measuring 5.9 cm, margins negative, LVI positive, response to therapy seen including hyalin fibrosis and dystrophic calcifications.  9/18 lymph nodes positive, largest focus of metastatic carcinoma measures 1.2 cm with extranodal extension.  Right breast lumpectomy shows atypical lobular hyperplasia. - We discussed switching to Kadcyla in the adjuvant setting.  Related to efficacy of Kadcyla compared with pertuzumab and trastuzumab in the adjuvant setting is unknown.  Patient is reluctant to consider switching as she had poor tolerance with the prior chemotherapy.  Hence we have continued Herceptin and Perjeta. -  XRT to the chest wall from 09/13/2022   Social/family history: - She lives at home with her husband and daughter.  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  Current active smoker, half pack per day since age 36. - Mother had cervical cancer and bladder cancer.  Maternal grandmother had ovarian cancer.  Brother died at age 27 from acute leukemia.  Another brother died of liver cancer.  Maternal half aunt had breast cancer.   PLAN:  Oligometastatic (two 7 mm lung lesions) HER2 positive left breast cancer: -CT CAP on 06/17/2022: Pulmonary nodules stable.  No evidence of metastatic disease. - She is tolerating anastrozole reasonably well. - We reviewed echocardiogram results. - We reviewed labs which showed alk phos elevated at 140 which is stable.  Creatinine is elevated at 1.51.  CBC was grossly normal with hemoglobin 10.5. - She will receive 500 mL of normal saline today. - She will proceed with treatment today and in 3 weeks.  RTC 6 weeks for follow-up.  Will plan to repeat CT CAP and tumor markers prior to next visit.   2.  Right facial shingles/postherpetic right hemifacial pain: -Continue gabapentin 300 mg 3 times daily.  Occasional cramping in the hands is stable.  3.  Hypomagnesemia: -Continue magnesium 2 tablets in a.m. and 1 tablet in p.m.  4.  High risk drug monitoring: -Reviewed 2D echocardiogram from 10/15/2022: EF 55-60% and stable.  5.  Hypertension: -Continue amlodipine daily.  Blood pressure is well-controlled.  6.  Left leg DVT (diagnosed 03/04/2022): -Continue Eliquis twice daily.  No bleeding issues.  7.  Osteopenia (DEXA 04/19/2022 T score -2.1): -Continue calcium and vitamin D supplements.          Orders placed this encounter:  No orders of the defined types were placed in this encounter.     Derek Jack, MD [place] [phone]

## 2022-11-04 NOTE — Patient Instructions (Signed)
[place] at [facility] Discharge Instructions   You were seen and examined today by Dr. Delton Coombes.  He reviewed the results of your lab work which are normal/stable. We will do iron levels at next visit since your hemoglobin has dropped slightly.  He reviewed the results of your echocardiogram which is normal.   We will repeat a CT scan prior to your next visit with Dr. Raliegh Ip.   We will proceed with your treatment today.   Return as scheduled.    Thank you for choosing [place] at [facility] to provide your oncology and hematology care.  To afford each patient quality time with our provider, please arrive at least 15 minutes before your scheduled appointment time.   If you have a lab appointment with the [facility] please come in thru the Main Entrance and check in at the main information desk.  You need to re-schedule your appointment should you arrive 10 or more minutes late.  We strive to give you quality time with our providers, and arriving late affects you and other patients whose appointments are after yours.  Also, if you no show three or more times for appointments you may be dismissed from the clinic at the providers discretion.     Again, thank you for choosing [facility].  Our hope is that these requests will decrease the amount of time that you wait before being seen by our physicians.       _____________________________________________________________  Should you have questions after your visit to [facility], please contact our office at [phone] and follow the prompts.  Our office hours are 8:00 a.m. and 4:30 p.m. Monday - Friday.  Please note that voicemails left after 4:00 p.m. may not be returned until the following business day.  We are closed weekends and major holidays.  You do have access to a nurse 24-7, just call the main number to the clinic [phone] and do not press any  options, hold on the line and a nurse will answer the phone.    For prescription refill requests, have your pharmacy contact our office and allow 72 hours.    Due to Covid, you will need to wear a mask upon entering the hospital. If you do not have a mask, a mask will be given to you at the Main Entrance upon arrival. For doctor visits, patients may have 1 support person age 39 or older with them. For treatment visits, patients can not have anyone with them due to social distancing guidelines and our immunocompromised population.

## 2022-11-04 NOTE — Patient Instructions (Signed)
[facility]  Discharge Instructions: Thank you for choosing [facility] to provide your oncology and hematology care.  If you have a lab appointment with the [facility], please come in thru the Main Entrance and check in at the main information desk.  Wear comfortable clothing and clothing appropriate for easy access to any Portacath or PICC line.   We strive to give you quality time with your provider. You may need to reschedule your appointment if you arrive late (15 or more minutes).  Arriving late affects you and other patients whose appointments are after yours.  Also, if you miss three or more appointments without notifying the office, you may be dismissed from the clinic at the provider's discretion.      For prescription refill requests, have your pharmacy contact our office and allow 72 hours for refills to be completed.    Today you received the following chemotherapy and/or immunotherapy agents Kanjinti, Perjeta. Pertuzumab Injection What is this medication? PERTUZUMAB (per TOOZ ue mab) treats breast cancer. It works by blocking a protein that causes cancer cells to grow and multiply. This helps to slow or stop the spread of cancer cells. It is a monoclonal antibody. This medicine may be used for other purposes; ask your health care provider or pharmacist if you have questions. COMMON BRAND NAME(S): PERJETA What should I tell my care team before I take this medication? They need to know if you have any of these conditions: Heart failure An unusual or allergic reaction to pertuzumab, other medications, foods, dyes, or preservatives Pregnant or trying to get pregnant Breast-feeding How should I use this medication? This medication is injected into a vein. It is given by your care team in a hospital or clinic setting. Talk to your care team about the use of this medication in children. Special care may be needed. Overdosage: If you think you  have taken too much of this medicine contact a poison control center or emergency room at once. NOTE: This medicine is only for you. Do not share this medicine with others. What if I miss a dose? Keep appointments for follow-up doses. It is important not to miss your dose. Call your care team if you are unable to keep an appointment. What may interact with this medication? Interactions are not expected. This list may not describe all possible interactions. Give your health care provider a list of all the medicines, herbs, non-prescription drugs, or dietary supplements you use. Also tell them if you smoke, drink alcohol, or use illegal drugs. Some items may interact with your medicine. What should I watch for while using this medication? Your condition will be monitored carefully while you are receiving this medication. This medication may make you feel generally unwell. This is not uncommon as chemotherapy can affect healthy cells as well as cancer cells. Report any side effects. Continue your course of treatment even though you feel ill unless your care team tells you to stop. Talk to your care team if you may be pregnant. Serious birth defects can occur if you take this medication during pregnancy and for 7 months after the last dose. You will need a negative pregnancy test before starting this medication. Contraception is recommended while taking this medication and for 7 months after the last dose. Your care team can help you find the option that works for you. Do not breastfeed while taking this medication and for 7 months after the last dose. What side effects  may I notice from receiving this medication? Side effects that you should report to your care team as soon as possible: Allergic reactions or angioedema--skin rash, itching or hives, swelling of the face, eyes, lips, tongue, arms, or legs, trouble swallowing or breathing Heart failure--shortness of breath, swelling of the ankles, feet, or  hands, sudden weight gain, unusual weakness or fatigue Infusion reactions--chest pain, shortness of breath or trouble breathing, feeling faint or lightheaded Side effects that usually do not require medical attention (report to your care team if they continue or are bothersome): Diarrhea Dry skin Fatigue Hair loss Nausea Vomiting This list may not describe all possible side effects. Call your doctor for medical advice about side effects. You may report side effects to FDA at 1-800-FDA-1088. Where should I keep my medication? This medication is given in a hospital or clinic. It will not be stored at home. NOTE: This sheet is a summary. It may not cover all possible information. If you have questions about this medicine, talk to your doctor, pharmacist, or health care provider.  2023 Elsevier/Gold Standard (2011-02-02 00:00:00) Trastuzumab Injection What is this medication? TRASTUZUMAB (tras TOO zoo mab) treats breast cancer and stomach cancer. It works by blocking a protein that causes cancer cells to grow and multiply. This helps to slow or stop the spread of cancer cells. This medicine may be used for other purposes; ask your health care provider or pharmacist if you have questions. COMMON BRAND NAME(S): Herceptin, Janae Bridgeman, Ontruzant, Trazimera What should I tell my care team before I take this medication? They need to know if you have any of these conditions: Heart failure Lung disease An unusual or allergic reaction to trastuzumab, other medications, foods, dyes, or preservatives Pregnant or trying to get pregnant Breast-feeding How should I use this medication? This medication is injected into a vein. It is given by your care team in a hospital or clinic setting. Talk to your care team about the use of this medication in children. It is not approved for use in children. Overdosage: If you think you have taken too much of this medicine contact a poison control  center or emergency room at once. NOTE: This medicine is only for you. Do not share this medicine with others. What if I miss a dose? Keep appointments for follow-up doses. It is important not to miss your dose. Call your care team if you are unable to keep an appointment. What may interact with this medication? Certain types of chemotherapy, such as daunorubicin, doxorubicin, epirubicin, idarubicin This list may not describe all possible interactions. Give your health care provider a list of all the medicines, herbs, non-prescription drugs, or dietary supplements you use. Also tell them if you smoke, drink alcohol, or use illegal drugs. Some items may interact with your medicine. What should I watch for while using this medication? Your condition will be monitored carefully while you are receiving this medication. This medication may make you feel generally unwell. This is not uncommon, as chemotherapy affects healthy cells as well as cancer cells. Report any side effects. Continue your course of treatment even though you feel ill unless your care team tells you to stop. This medication may increase your risk of getting an infection. Call your care team for advice if you get a fever, chills, sore throat, or other symptoms of a cold or flu. Do not treat yourself. Try to avoid being around people who are sick. Avoid taking medications that contain aspirin, acetaminophen, ibuprofen, naproxen,  or ketoprofen unless instructed by your care team. These medications can hide a fever. Talk to your care team if you may be pregnant. Serious birth defects can occur if you take this medication during pregnancy and for 7 months after the last dose. You will need a negative pregnancy test before starting this medication. Contraception is recommended while taking this medication and for 7 months after the last dose. Your care team can help you find the option that works for you. Do not breastfeed while taking this  medication and for 7 months after stopping treatment. What side effects may I notice from receiving this medication? Side effects that you should report to your care team as soon as possible: Allergic reactions or angioedema--skin rash, itching or hives, swelling of the face, eyes, lips, tongue, arms, or legs, trouble swallowing or breathing Dry cough, shortness of breath or trouble breathing Heart failure--shortness of breath, swelling of the ankles, feet, or hands, sudden weight gain, unusual weakness or fatigue Infection--fever, chills, cough, or sore throat Infusion reactions--chest pain, shortness of breath or trouble breathing, feeling faint or lightheaded Side effects that usually do not require medical attention (report to your care team if they continue or are bothersome): Diarrhea Dizziness Headache Nausea Trouble sleeping Vomiting This list may not describe all possible side effects. Call your doctor for medical advice about side effects. You may report side effects to FDA at 1-800-FDA-1088. Where should I keep my medication? This medication is given in a hospital or clinic. It will not be stored at home. NOTE: This sheet is a summary. It may not cover all possible information. If you have questions about this medicine, talk to your doctor, pharmacist, or health care provider.  2023 Elsevier/Gold Standard (2021-12-10 00:00:00)       To help prevent nausea and vomiting after your treatment, we encourage you to take your nausea medication as directed.  BELOW ARE SYMPTOMS THAT SHOULD BE REPORTED IMMEDIATELY: *FEVER GREATER THAN 100.4 F (38 C) OR HIGHER *CHILLS OR SWEATING *NAUSEA AND VOMITING THAT IS NOT CONTROLLED WITH YOUR NAUSEA MEDICATION *UNUSUAL SHORTNESS OF BREATH *UNUSUAL BRUISING OR BLEEDING *URINARY PROBLEMS (pain or burning when urinating, or frequent urination) *BOWEL PROBLEMS (unusual diarrhea, constipation, pain near the anus) TENDERNESS IN MOUTH AND THROAT  WITH OR WITHOUT PRESENCE OF ULCERS (sore throat, sores in mouth, or a toothache) UNUSUAL RASH, SWELLING OR PAIN  UNUSUAL VAGINAL DISCHARGE OR ITCHING   Items with * indicate a potential emergency and should be followed up as soon as possible or go to the Emergency Department if any problems should occur.  Please show the CHEMOTHERAPY ALERT CARD or IMMUNOTHERAPY ALERT CARD at check-in to the Emergency Department and triage nurse.  Should you have questions after your visit or need to cancel or reschedule your appointment, please contact [facility] [phone]  and follow the prompts.  Office hours are 8:00 a.m. to 4:30 p.m. Monday - Friday. Please note that voicemails left after 4:00 p.m. may not be returned until the following business day.  We are closed weekends and major holidays. You have access to a nurse at all times for urgent questions. Please call the main number to the clinic [phone] and follow the prompts.  For any non-urgent questions, you may also contact your provider using MyChart. We now offer e-Visits for anyone 52 and older to request care online for non-urgent symptoms. For details visit [email or web address].   Also download the MyChart app! Go to the app store,  search "MyChart", open the app, select [facility], and log in with your MyChart username and password.

## 2022-11-04 NOTE — Progress Notes (Signed)
Patient is taking Anastrozole as prescribed.  She has not missed any doses and reports no side effects at this time other than hot flashes.  ° °

## 2022-11-05 ENCOUNTER — Encounter: Payer: Self-pay | Admitting: Hematology

## 2022-11-05 ENCOUNTER — Other Ambulatory Visit: Payer: Self-pay | Admitting: Hematology

## 2022-11-05 ENCOUNTER — Encounter (HOSPITAL_COMMUNITY): Payer: Self-pay | Admitting: Hematology

## 2022-11-05 LAB — CANCER ANTIGEN 27.29: CA 27.29: 32.6 U/mL (ref 0.0–38.6)

## 2022-11-07 LAB — CANCER ANTIGEN 15-3: CA 15-3: 36.8 U/mL — ABNORMAL HIGH (ref 0.0–25.0)

## 2022-11-09 NOTE — Telephone Encounter (Signed)
Pharmacy Patient Advocate Encounter  Prior Authorization for Repatha 140mg /ml has been approved by [employer] (ins).    key # [id number]  Effective dates: 3.18.24 through 3.18.25

## 2022-11-18 ENCOUNTER — Other Ambulatory Visit: Payer: Self-pay | Admitting: Physician Assistant

## 2022-11-18 DIAGNOSIS — F419 Anxiety disorder, unspecified: Secondary | ICD-10-CM

## 2022-11-23 ENCOUNTER — Encounter: Payer: Self-pay | Admitting: Hematology

## 2022-11-23 ENCOUNTER — Other Ambulatory Visit: Payer: Self-pay

## 2022-11-23 ENCOUNTER — Encounter (HOSPITAL_COMMUNITY): Payer: Self-pay | Admitting: Hematology

## 2022-11-23 DIAGNOSIS — Z170 Estrogen receptor positive status [ER+]: Secondary | ICD-10-CM

## 2022-11-23 DIAGNOSIS — Z95828 Presence of other vascular implants and grafts: Secondary | ICD-10-CM

## 2022-11-25 ENCOUNTER — Inpatient Hospital Stay: Payer: Managed Care, Other (non HMO) | Attending: Oncology

## 2022-11-25 ENCOUNTER — Inpatient Hospital Stay: Payer: Managed Care, Other (non HMO)

## 2022-11-25 VITALS — BP 122/76 | HR 70 | Temp 97.3°F | Resp 20 | Wt 152.8 lb

## 2022-11-25 VITALS — BP 116/69 | HR 65 | Temp 96.9°F | Resp 18

## 2022-11-25 DIAGNOSIS — Z5112 Encounter for antineoplastic immunotherapy: Secondary | ICD-10-CM | POA: Diagnosis present

## 2022-11-25 DIAGNOSIS — Z79811 Long term (current) use of aromatase inhibitors: Secondary | ICD-10-CM | POA: Insufficient documentation

## 2022-11-25 DIAGNOSIS — Z79899 Other long term (current) drug therapy: Secondary | ICD-10-CM | POA: Diagnosis not present

## 2022-11-25 DIAGNOSIS — N6011 Diffuse cystic mastopathy of right breast: Secondary | ICD-10-CM | POA: Insufficient documentation

## 2022-11-25 DIAGNOSIS — C50812 Malignant neoplasm of overlapping sites of left female breast: Secondary | ICD-10-CM | POA: Diagnosis present

## 2022-11-25 DIAGNOSIS — Z800 Family history of malignant neoplasm of digestive organs: Secondary | ICD-10-CM | POA: Insufficient documentation

## 2022-11-25 DIAGNOSIS — Z803 Family history of malignant neoplasm of breast: Secondary | ICD-10-CM | POA: Diagnosis not present

## 2022-11-25 DIAGNOSIS — Z8041 Family history of malignant neoplasm of ovary: Secondary | ICD-10-CM | POA: Diagnosis not present

## 2022-11-25 DIAGNOSIS — R918 Other nonspecific abnormal finding of lung field: Secondary | ICD-10-CM | POA: Diagnosis not present

## 2022-11-25 DIAGNOSIS — F17210 Nicotine dependence, cigarettes, uncomplicated: Secondary | ICD-10-CM | POA: Diagnosis not present

## 2022-11-25 DIAGNOSIS — Z95828 Presence of other vascular implants and grafts: Secondary | ICD-10-CM

## 2022-11-25 DIAGNOSIS — Z170 Estrogen receptor positive status [ER+]: Secondary | ICD-10-CM | POA: Diagnosis not present

## 2022-11-25 DIAGNOSIS — N6081 Other benign mammary dysplasias of right breast: Secondary | ICD-10-CM | POA: Insufficient documentation

## 2022-11-25 DIAGNOSIS — Z9012 Acquired absence of left breast and nipple: Secondary | ICD-10-CM | POA: Diagnosis not present

## 2022-11-25 LAB — CBC WITH DIFFERENTIAL/PLATELET
Abs Immature Granulocytes: 0.03 10*3/uL (ref 0.00–0.07)
Basophils Absolute: 0.1 10*3/uL (ref 0.0–0.1)
Basophils Relative: 1 %
Eosinophils Absolute: 0.3 10*3/uL (ref 0.0–0.5)
Eosinophils Relative: 4 %
HCT: 34.2 % — ABNORMAL LOW (ref 36.0–46.0)
Hemoglobin: 11.1 g/dL — ABNORMAL LOW (ref 12.0–15.0)
Immature Granulocytes: 1 %
Lymphocytes Relative: 14 %
Lymphs Abs: 0.8 10*3/uL (ref 0.7–4.0)
MCH: 30.8 pg (ref 26.0–34.0)
MCHC: 32.5 g/dL (ref 30.0–36.0)
MCV: 95 fL (ref 80.0–100.0)
Monocytes Absolute: 0.5 10*3/uL (ref 0.1–1.0)
Monocytes Relative: 8 %
Neutro Abs: 4.1 10*3/uL (ref 1.7–7.7)
Neutrophils Relative %: 72 %
Platelets: 213 10*3/uL (ref 150–400)
RBC: 3.6 MIL/uL — ABNORMAL LOW (ref 3.87–5.11)
RDW: 15.9 % — ABNORMAL HIGH (ref 11.5–15.5)
WBC: 5.6 10*3/uL (ref 4.0–10.5)
nRBC: 0 % (ref 0.0–0.2)

## 2022-11-25 LAB — COMPREHENSIVE METABOLIC PANEL
ALT: 13 U/L (ref 0–44)
AST: 16 U/L (ref 15–41)
Albumin: 3.5 g/dL (ref 3.5–5.0)
Alkaline Phosphatase: 122 U/L (ref 38–126)
Anion gap: 7 (ref 5–15)
BUN: 32 mg/dL — ABNORMAL HIGH (ref 8–23)
CO2: 24 mmol/L (ref 22–32)
Calcium: 10 mg/dL (ref 8.9–10.3)
Chloride: 105 mmol/L (ref 98–111)
Creatinine, Ser: 1.6 mg/dL — ABNORMAL HIGH (ref 0.44–1.00)
GFR, Estimated: 36 mL/min — ABNORMAL LOW (ref >60)
Glucose, Bld: 110 mg/dL — ABNORMAL HIGH (ref 70–99)
Potassium: 4.1 mmol/L (ref 3.5–5.1)
Sodium: 136 mmol/L (ref 135–145)
Total Bilirubin: 0.6 mg/dL (ref 0.3–1.2)
Total Protein: 7 g/dL (ref 6.5–8.1)

## 2022-11-25 LAB — MAGNESIUM: Magnesium: 1.6 mg/dL — ABNORMAL LOW (ref 1.7–2.4)

## 2022-11-25 MED ORDER — CETIRIZINE HCL 10 MG PO TABS: 10.0000 mg | ORAL_TABLET | Freq: Once | ORAL | Status: DC

## 2022-11-25 MED ORDER — SODIUM CHLORIDE 0.9 % IV SOLN
420.0000 mg | Freq: Once | INTRAVENOUS | Status: AC
  Administered 2022-11-25: 420 mg via INTRAVENOUS
  Filled 2022-11-25: qty 14

## 2022-11-25 MED ORDER — SODIUM CHLORIDE 0.9% FLUSH
10.0000 mL | Freq: Once | INTRAVENOUS | Status: AC
  Administered 2022-11-25: 10 mL via INTRAVENOUS

## 2022-11-25 MED ORDER — HEPARIN SOD (PORK) LOCK FLUSH 100 UNIT/ML IV SOLN
500.0000 [IU] | Freq: Once | INTRAVENOUS | Status: AC | PRN
  Administered 2022-11-25: 500 [IU]

## 2022-11-25 MED ORDER — ACETAMINOPHEN 325 MG PO TABS: 650.0000 mg | ORAL_TABLET | Freq: Once | ORAL | Status: DC

## 2022-11-25 MED ORDER — TRASTUZUMAB-ANNS CHEMO 150 MG IV SOLR
6.0000 mg/kg | Freq: Once | INTRAVENOUS | Status: AC
  Administered 2022-11-25: 420 mg via INTRAVENOUS
  Filled 2022-11-25: qty 20

## 2022-11-25 MED ORDER — SODIUM CHLORIDE 0.9 % IV SOLN: Freq: Once | INTRAVENOUS | Status: AC

## 2022-11-25 MED ORDER — SODIUM CHLORIDE 0.9% FLUSH
10.0000 mL | INTRAVENOUS | Status: DC | PRN
  Administered 2022-11-25: 10 mL

## 2022-11-25 NOTE — Patient Instructions (Signed)
[facility]  Discharge Instructions: Thank you for choosing [facility] to provide your oncology and hematology care.  If you have a lab appointment with the [facility] - please note that after April 8th, 2024, all labs will be drawn in the cancer center.  You do not have to check in or register with the main entrance as you have in the past but will complete your check-in in the cancer center.  Wear comfortable clothing and clothing appropriate for easy access to any Portacath or PICC line.   We strive to give you quality time with your provider. You may need to reschedule your appointment if you arrive late (15 or more minutes).  Arriving late affects you and other patients whose appointments are after yours.  Also, if you miss three or more appointments without notifying the office, you may be dismissed from the clinic at the provider's discretion.      For prescription refill requests, have your pharmacy contact our office and allow 72 hours for refills to be completed.    Today you received the following chemotherapy and/or immunotherapy agents traztuzumab,perjeta   To help prevent nausea and vomiting after your treatment, we encourage you to take your nausea medication as directed.  BELOW ARE SYMPTOMS THAT SHOULD BE REPORTED IMMEDIATELY: *FEVER GREATER THAN 100.4 F (38 C) OR HIGHER *CHILLS OR SWEATING *NAUSEA AND VOMITING THAT IS NOT CONTROLLED WITH YOUR NAUSEA MEDICATION *UNUSUAL SHORTNESS OF BREATH *UNUSUAL BRUISING OR BLEEDING *URINARY PROBLEMS (pain or burning when urinating, or frequent urination) *BOWEL PROBLEMS (unusual diarrhea, constipation, pain near the anus) TENDERNESS IN MOUTH AND THROAT WITH OR WITHOUT PRESENCE OF ULCERS (sore throat, sores in mouth, or a toothache) UNUSUAL RASH, SWELLING OR PAIN  UNUSUAL VAGINAL DISCHARGE OR ITCHING   Items with * indicate a potential emergency and should be followed up as soon as possible or go  to the Emergency Department if any problems should occur.  Please show the CHEMOTHERAPY ALERT CARD or IMMUNOTHERAPY ALERT CARD at check-in to the Emergency Department and triage nurse.  Should you have questions after your visit or need to cancel or reschedule your appointment, please contact [facility] [phone]  and follow the prompts.  Office hours are 8:00 a.m. to 4:30 p.m. Monday - Friday. Please note that voicemails left after 4:00 p.m. may not be returned until the following business day.  We are closed weekends and major holidays. You have access to a nurse at all times for urgent questions. Please call the main number to the clinic [phone] and follow the prompts.  For any non-urgent questions, you may also contact your provider using MyChart. We now offer e-Visits for anyone 25 and older to request care online for non-urgent symptoms. For details visit [email or web address].   Also download the MyChart app! Go to the app store, search "MyChart", open the app, select [facility], and log in with your MyChart username and password.

## 2022-11-25 NOTE — Progress Notes (Signed)
Patient presents today for treatment. (Kanjinti and Perjeta). Vital signs within parameters for treatment.

## 2022-11-25 NOTE — Progress Notes (Signed)
Patient took pre-meds at home. Notified MD her creatinine was elevated.

## 2022-11-26 ENCOUNTER — Encounter: Payer: Self-pay | Admitting: Hematology

## 2022-11-26 ENCOUNTER — Encounter (HOSPITAL_COMMUNITY): Payer: Self-pay | Admitting: Hematology

## 2022-11-29 ENCOUNTER — Ambulatory Visit (HOSPITAL_COMMUNITY): Payer: Managed Care, Other (non HMO) | Attending: Hematology | Admitting: Physical Therapy

## 2022-11-29 ENCOUNTER — Other Ambulatory Visit: Payer: Self-pay

## 2022-11-29 DIAGNOSIS — I972 Postmastectomy lymphedema syndrome: Secondary | ICD-10-CM | POA: Diagnosis present

## 2022-11-29 NOTE — Therapy (Signed)
OUTPATIENT PT/OT UPPER EXTREMITY LYMPHEDEMA EVALUATION  Patient Name: Traci Fuller MRN: [id number] DOB:1956/08/21, 67 y.o., female Today's Date: 11/29/2022  END OF SESSION:  PT End of Session - 11/29/22 1200    Visit Number 1    Number of Visits 3    Date for PT Re-Evaluation 12/21/22    Authorization Type [employer] managed    Progress Note Due on Visit 3    PT Start Time 1115    PT Stop Time 1200    PT Time Calculation (min) 45 min    Activity Tolerance Patient tolerated treatment well    Behavior During Therapy [facility] for tasks assessed/performed             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 DES to midLAD, 75% stenosis of RCA not treated   Cancer    Current smoker    Essential hypertension    Family history of bladder cancer    Family history of breast cancer    Family history of leukemia    Family history of uterine cancer    Hyperlipidemia LDL goal <70    Myocardial infarction    PONV (postoperative nausea and vomiting)    Port-A-Cath in place 10/29/2021   Past Surgical History:  Procedure Laterality Date   ABDOMINAL HYSTERECTOMY     BREAST BIOPSY Bilateral 10/16/2021   BREAST BIOPSY WITH RADIO FREQUENCY LOCALIZER Right 05/05/2022   Procedure: BREAST BIOPSY WITH RADIO FREQUENCY LOCALIZER;  Surgeon: Lucretia Roers, MD;  Location: [facility] ORS;  Service: General;  Laterality: Right;   CORONARY STENT INTERVENTION N/A 01/10/2018   Procedure: CORONARY STENT INTERVENTION;  Surgeon: Corky Crafts, MD;  Location: [facility] INVASIVE CV LAB;  Service: Cardiovascular;  Laterality: N/A;   LEFT HEART CATH AND CORONARY ANGIOGRAPHY N/A 01/10/2018   Procedure: LEFT HEART CATH AND CORONARY ANGIOGRAPHY;  Surgeon: Corky Crafts, MD;  Location: [facility] INVASIVE CV LAB;  Service: Cardiovascular;  Laterality: N/A;   MASTECTOMY MODIFIED RADICAL Left 05/05/2022   Procedure: MASTECTOMY MODIFIED RADICAL WITH RADIO FREQUENCY  LOCALIZER;  Surgeon: Lucretia Roers, MD;  Location: [facility] ORS;  Service: General;  Laterality: Left;   PORTACATH PLACEMENT Right 10/26/2021   Procedure: INSERTION PORT-A-CATH;  Surgeon: Lucretia Roers, MD;  Location: [facility] ORS;  Service: General;  Laterality: Right;   Patient Active Problem List   Diagnosis Date Noted   Breast cancer 05/05/2022   Breast mass, right 04/22/2022   UTI (urinary tract infection) 01/06/2022   Dehydration 01/01/2022   Nausea vomiting and diarrhea 01/01/2022   Hypokalemia 01/01/2022   Metabolic acidosis 01/01/2022   Leukocytosis 01/01/2022   Thrombocytopenia 01/01/2022   Anemia due to chronic kidney disease 01/01/2022   Acute on chronic renal failure 01/01/2022   Genetic testing 11/03/2021   Port-A-Cath in place 10/29/2021   Family history of breast cancer 10/22/2021   Family history of uterine cancer 10/22/2021   Family history of bladder cancer 10/22/2021   Family history of leukemia 10/22/2021   Breast cancer metastasized to axillary lymph node, left 10/15/2021   Breast cancer, left 10/15/2021   CAD S/P percutaneous coronary angioplasty 01/19/2018   Essential hypertension 01/19/2018   Smoker 01/12/2018   Hyperlipidemia LDL goal <70 01/12/2018   Acute ST elevation myocardial infarction (STEMI) of anterior wall 01/10/2018   Unstable angina 01/10/2018    PCP: Nita Sells  REFERRING PROVIDER: [name], MD  REFERRING DIAG: Diagnosis Information  Diagnosis  Description  -Left upper extremity lymphedema -Malignant neoplasm of overlapping sites of left breast in female, estrogen receptor positive    THERAPY DIAG:  Postmastectomy lymphedema  ONSET DATE: 05/05/2022  Rationale for Evaluation and Treatment: Rehabilitation  SUBJECTIVE                                                                                                                                                                                           SUBJECTIVE STATEMENT:   Pt states that she is not having any pain at this time.  Pt states that she has noted puffiness along the axillary area on her Lt side.    PERTINENT HISTORY: Right breast excisional biopsy after radiofrequency tag placement; Left breast modified radical mastectomy on 05/05/2022.  S/p both chemo and radiation   PAIN:  Are you having pain? No PRECAUTIONS: Other: cellulitis  WEIGHT BEARING RESTRICTIONS: No  FALLS:  Has patient fallen in last 6 months? 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     OBJECTIVE  COGNITION:  Overall cognitive status: Within functional limits for tasks assessed   PALPATION: No induration noted.   POSTURE: unremarkable  HAND DOMINANCE: Right  UPPER EXTREMITY and Cervical  AROM  appear wfl    LYMPHEDEMA ASSESSMENTS:   SURGERY TYPE/DATE: 05/05/22 Right breast excisional biopsy after radiofrequency tag placement; Left breast modified radical mastectomy    NUMBER OF LYMPH NODES REMOVED: Rt : 1 ; Lt 18 Removed.   CHEMOTHERAPY: completed  RADIATION:completed   HORMONE TREATMENT: no  INFECTIONS: no  LYMPHEDEMA ASSESSMENTS:   LANDMARK RIGHT eval  10 cm proximal to olecranon process 32.3  Olecranon process 27.2  10 cm proximal to ulnar styloid process 22.1  Just proximal to ulnar styloid process 15.8  Across hand at thumb web space 18.5  At base of 2nd digit   (Blank rows = not tested)  LANDMARK LEFT  eval  10 cm proximal to olecranon process 31.3  Olecranon process 27.5  10 cm proximal to ulnar styloid process 21.3  Just proximal to ulnar styloid process 16.7  Across hand at thumb web space 17.5   At base of 2nd digit   (Blank rows  = not tested)   TODAY'S TREATMENT:  DATE: 11/29/22: Evaluation:  Explained to pt what lymphedema is and how it is detected and controled. Explained self manual techniques for Lt subaxillary area to include supraclavicular, Lt groin and pump from Lt axillary to Lt groin area.   Explained the benefit of compression and diaphragmic breathing Therapist made compression from 1/2" foam to put under bra to increase the compression.    PATIENT EDUCATION:  Education details: as above  Person educated: Patient Education method: Explanation, ActorTactile cues, Verbal cues, and Handouts Education comprehension: verbalized understanding  HOME EXERCISE PROGRAM: Self manual as above and diaphragm breathing.   ASSESSMENT:  CLINICAL IMPRESSION: Patient is a 67 y.o. female  who was seen today for physical therapy evaluation and treatment for  lymphedema. PT states that she has no edema in her UE just subaxillary area on her LT side.  Pt was educated in self manual without routing to Rt or completing UE, compression and diaphragm breathing.  Pt will benefit from two more visits to ensure that she feels confident in her self care.   OBJECTIVE IMPAIRMENTS: increased edema.   ACTIVITY LIMITATIONS: carrying  REHAB POTENTIAL: Good  CLINICAL DECISION MAKING: Stable/uncomplicated  EVALUATION COMPLEXITY: Moderate  GOALS: Goals reviewed with patient? No  SHORT TERM GOALS: Target date: 12/06/2022    PT to be completing self manual techniques to decrease edema Baseline: Goal status: INITIAL  2.  PT to be completing breathing techniques to decrease edema Baseline:  Goal status: INITIAL  3.  Pt to feel edema is decreased 30% Baseline:  Goal status: INITIAL    LONG TERM GOALS: Target date: 12/12/21  PT to note edema is down 75% Baseline:  Goal status: INITIAL  2.  PT  to be wearing compression every day.  Baseline:  Goal status: INITIAL   PLAN:  PT FREQUENCY: 1x/week  PT DURATION: 3 weeks  PLANNED INTERVENTIONS: Therapeutic exercises, Therapeutic activity, Patient/Family education, Self Care, Manual lymph drainage, and Manual therapy  PLAN FOR NEXT SESSION: review manual, assist pt with compression garment for LE if needed.  Answer any questions see if pt feels she is ready for discharge.  Virgina Organynthia Romy Mcgue, PT CLT [phone]  11/29/2022, 12:00AM

## 2022-12-01 ENCOUNTER — Ambulatory Visit (HOSPITAL_COMMUNITY): Payer: Managed Care, Other (non HMO) | Admitting: Physical Therapy

## 2022-12-03 ENCOUNTER — Encounter: Payer: Self-pay | Admitting: Hematology

## 2022-12-03 ENCOUNTER — Encounter (HOSPITAL_COMMUNITY): Payer: Self-pay | Admitting: Hematology

## 2022-12-03 ENCOUNTER — Encounter (HOSPITAL_COMMUNITY): Payer: Managed Care, Other (non HMO)

## 2022-12-06 ENCOUNTER — Ambulatory Visit (HOSPITAL_COMMUNITY): Payer: Managed Care, Other (non HMO) | Admitting: Physical Therapy

## 2022-12-06 DIAGNOSIS — I972 Postmastectomy lymphedema syndrome: Secondary | ICD-10-CM

## 2022-12-06 NOTE — Therapy (Signed)
OUTPATIENT PT/OT UPPER EXTREMITY LYMPHEDEMA TREATMENT/DISCHARGE  Patient Name: Traci Fuller MRN: [id number] DOB:07/15/1956, 67 y.o., female Today's Date: 12/06/2022 PHYSICAL THERAPY DISCHARGE SUMMARY  Visits from Start of Care: 2  Current functional level related to goals / functional outcomes: Pt I in self manual.     Remaining deficits: Still some edema noted   Education / Equipment: Self manual   Patient agrees to discharge. Patient goals were met. Patient is being discharged due to being pleased with the current functional level.   END OF SESSION:   PT End of Session - 12/06/22 1201     Visit Number 2    Number of Visits 2   Date for PT Re-Evaluation 12/21/22    Authorization Type [employer] managed    Progress Note Due on Visit 3    PT Start Time 1120    PT Stop Time 1145    PT Time Calculation (min) 25 min    Activity Tolerance Patient tolerated treatment well    Behavior During Therapy WFL for tasks assessed/performed                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 DES to midLAD, 75% stenosis of RCA not treated   Cancer    Current smoker    Essential hypertension    Family history of bladder cancer    Family history of breast cancer    Family history of leukemia    Family history of uterine cancer    Hyperlipidemia LDL goal <70    Myocardial infarction    PONV (postoperative nausea and vomiting)    Port-A-Cath in place 10/29/2021   Past Surgical History:  Procedure Laterality Date   ABDOMINAL HYSTERECTOMY     BREAST BIOPSY Bilateral 10/16/2021   BREAST BIOPSY WITH RADIO FREQUENCY LOCALIZER Right 05/05/2022   Procedure: BREAST BIOPSY WITH RADIO FREQUENCY LOCALIZER;  Surgeon: Lucretia Roers, MD;  Location: [facility];  Service: General;  Laterality: Right;   CORONARY STENT INTERVENTION N/A 01/10/2018   Procedure: CORONARY STENT INTERVENTION;  Surgeon: Corky Crafts, MD;  Location: [facility]  INVASIVE CV LAB;  Service: Cardiovascular;  Laterality: N/A;   LEFT HEART CATH AND CORONARY ANGIOGRAPHY N/A 01/10/2018   Procedure: LEFT HEART CATH AND CORONARY ANGIOGRAPHY;  Surgeon: Corky Crafts, MD;  Location: [facility] INVASIVE CV LAB;  Service: Cardiovascular;  Laterality: N/A;   MASTECTOMY MODIFIED RADICAL Left 05/05/2022   Procedure: MASTECTOMY MODIFIED RADICAL WITH RADIO FREQUENCY LOCALIZER;  Surgeon: Lucretia Roers, MD;  Location: [facility];  Service: General;  Laterality: Left;   PORTACATH PLACEMENT Right 10/26/2021   Procedure: INSERTION PORT-A-CATH;  Surgeon: Lucretia Roers, MD;  Location: [facility];  Service: General;  Laterality: Right;   Patient Active Problem List   Diagnosis Date Noted   Breast cancer 05/05/2022   Breast mass, right 04/22/2022   UTI (urinary tract infection) 01/06/2022   Dehydration 01/01/2022   Nausea vomiting and diarrhea 01/01/2022   Hypokalemia 01/01/2022   Metabolic acidosis 01/01/2022   Leukocytosis 01/01/2022   Thrombocytopenia 01/01/2022   Anemia due to chronic kidney disease 01/01/2022   Acute on chronic renal failure 01/01/2022   Genetic testing 11/03/2021   Port-A-Cath in place 10/29/2021   Family history of breast cancer 10/22/2021   Family history of uterine cancer 10/22/2021   Family history of bladder cancer 10/22/2021   Family history of leukemia 10/22/2021   Breast cancer metastasized to  axillary lymph node, left 10/15/2021   Breast cancer, left 10/15/2021   CAD S/P percutaneous coronary angioplasty 01/19/2018   Essential hypertension 01/19/2018   Smoker 01/12/2018   Hyperlipidemia LDL goal <70 01/12/2018   Acute ST elevation myocardial infarction (STEMI) of anterior wall 01/10/2018   Unstable angina 01/10/2018    PCP: Nita Sells  REFERRING PROVIDER: [name], MD  REFERRING DIAG: Diagnosis Information  Diagnosis Description  -Left upper extremity lymphedema -Malignant neoplasm of overlapping sites of left breast  in female, estrogen receptor positive    THERAPY DIAG:  Postmastectomy lymphedema  ONSET DATE: 05/05/2022  Rationale for Evaluation and Treatment: Rehabilitation  SUBJECTIVE                                                                                                                                                                                           SUBJECTIVE STATEMENT:  Pt states that she is completing her self massage and not having any pain.  States she still has a little bump of edema under her arm but the pad the therapist made last time has really helped this.  Pt is wearing her compression tank built in bra she got from [employer].      PERTINENT HISTORY: Right breast excisional biopsy after radiofrequency tag placement; Left breast modified radical mastectomy on 05/05/2022.  S/p both chemo and radiation   PAIN:  Are you having pain? No PRECAUTIONS: Other: cellulitis  WEIGHT BEARING RESTRICTIONS: No  FALLS:  Has patient fallen in last 6 months? 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     OBJECTIVE  COGNITION:  Overall cognitive status: Within functional limits for tasks assessed   PALPATION: No induration noted.   POSTURE: unremarkable  HAND DOMINANCE: Right  UPPER EXTREMITY and Cervical  AROM  appear wfl    LYMPHEDEMA ASSESSMENTS:   SURGERY TYPE/DATE: 05/05/22 Right breast excisional biopsy after radiofrequency tag placement; Left breast modified radical mastectomy    NUMBER OF LYMPH NODES REMOVED: Rt : 1 ; Lt 18 Removed.   CHEMOTHERAPY: completed  RADIATION:completed   HORMONE TREATMENT: no  INFECTIONS: no  LYMPHEDEMA ASSESSMENTS:    LANDMARK RIGHT eval Right 12/06/22  10 cm proximal to olecranon process 32.3 31  Olecranon process 27.2 27  10  cm proximal to ulnar styloid process 22.1 22  Just proximal to ulnar styloid process 15.8 16  Across hand at thumb web space 18.5 18  At base of 2nd digit    (Blank rows = not tested)  LANDMARK LEFT  eval Left 12/06/22  10 cm proximal to olecranon process 31.3 31  Olecranon process 27.5 27  10  cm proximal to ulnar styloid process 21.3 21.5  Just proximal to ulnar styloid process 16.7 16.3  Across hand at thumb web space 17.5  17.5  At base of 2nd digit    (Blank rows = not tested)   TODAY'S TREATMENT:                                                                                                                                         DATE:  12/06/22 Measurements of UE's Questions answered Manual review Exercise review Goal review  11/29/22: Evaluation:  Explained to pt what lymphedema is and how it is detected and controled. Explained self manual techniques for Lt subaxillary area to include supraclavicular, Lt groin and pump from Lt axillary to Lt groin area.   Explained the benefit of compression and diaphragmic breathing Therapist made compression from 1/2" foam to put under bra to increase the compression.    PATIENT EDUCATION:  Education details: as above  Person educated: Patient Education method: Explanation, Actor cues, Verbal cues, and Handouts Education comprehension: verbalized understanding  HOME EXERCISE PROGRAM: Self manual as above and diaphragm breathing.   ASSESSMENT:  CLINICAL IMPRESSION: Patient with compliance to self manual and feels competent to complete independently.  UE's measured displaying equality between Lt/Rt UE.  Pt able to demonstrate/verbalize knowledge of lymphedema and self care.  Pt with no further questions or skilled needs at this point and agreeable to discharge.    OBJECTIVE IMPAIRMENTS: increased edema.    ACTIVITY LIMITATIONS: carrying  REHAB POTENTIAL: Good  CLINICAL DECISION MAKING: Stable/uncomplicated  EVALUATION COMPLEXITY: Moderate  GOALS: Goals reviewed with patient? No  SHORT TERM GOALS: Target date: 12/06/2022    PT to be completing self manual techniques to decrease edema Baseline: Goal status: MET  2.  PT to be completing breathing techniques to decrease edema Baseline:  Goal status: MET  3.  Pt to feel edema is decreased 30% Baseline:  Goal status: MET    LONG TERM GOALS: Target date: 12/12/21  PT to note edema is down 75% Baseline:  Goal status: NOT MET  2.  PT to be wearing compression every day.  Baseline:  Goal status: MET   PLAN:  PT FREQUENCY: 1x/week  PT DURATION: 3 weeks  PLANNED INTERVENTIONS: Therapeutic exercises, Therapeutic activity, Patient/Family education, Self Care, Manual lymph drainage, and Manual therapy  PLAN FOR NEXT SESSION:  Discharge to self maintenance.   Lurena Nida, PTA/CLT [facility] Ph: [phone]  Virgina Organ, PT CLT [phone]  12/06/2022, 12:08 PM

## 2022-12-07 ENCOUNTER — Ambulatory Visit (HOSPITAL_COMMUNITY): Payer: Managed Care, Other (non HMO)

## 2022-12-08 ENCOUNTER — Encounter (HOSPITAL_COMMUNITY): Payer: Managed Care, Other (non HMO) | Admitting: Physical Therapy

## 2022-12-10 ENCOUNTER — Encounter (HOSPITAL_COMMUNITY): Payer: Managed Care, Other (non HMO) | Admitting: Physical Therapy

## 2022-12-13 ENCOUNTER — Encounter (HOSPITAL_COMMUNITY): Payer: Managed Care, Other (non HMO) | Admitting: Physical Therapy

## 2022-12-15 ENCOUNTER — Encounter (HOSPITAL_COMMUNITY): Payer: Managed Care, Other (non HMO) | Admitting: Physical Therapy

## 2022-12-15 ENCOUNTER — Other Ambulatory Visit: Payer: Self-pay

## 2022-12-15 DIAGNOSIS — Z170 Estrogen receptor positive status [ER+]: Secondary | ICD-10-CM

## 2022-12-15 NOTE — Progress Notes (Signed)
[facility] [address], [place] [address]    Clinic Day:  12/16/2022  Referring physician: [name], MD  Patient Care Team: [name], MD as PCP - General (Internal Medicine) [name], MD as PCP - Cardiology (Cardiology) [name], MD as Medical Oncologist (Medical Oncology) [name], RN as Oncology Nurse Navigator (Medical Oncology)   ASSESSMENT & PLAN:   Assessment: 1. 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 - She noticed lump has gotten bigger and deformed since September 2022.  She has itching over the skin of the breast. - Left breast 4:00 biopsy: Invasive mammary carcinoma, grade 2, ER 100%, PR 70%, Ki-67 25%, HER2 FISH positive - Left breast 2:00 biopsy: Invasive ductal carcinoma, grade 2, ER 100%, PR 70%, Ki-67 25%, HER2 negative - Left axillary lymph node biopsy: IDC, ER 100%, PR 50%, HER2 negative, E-cadherin positive - Bilateral breast MRI on 10/09/2021: Multiple enhancing masses with intervening non-mass enhancement involving most of the LOQ and part of the UOQ.  Abnormal enhancement spans approximately 12.4 x 6.4 x 7 cm.  Diffuse skin thickening/edema.  Multiple left axillary lymph nodes, largest node measuring approximately 2.4 cm in the lower axilla.  Indeterminate non-mass enhancement inferior to the biopsy site in the subareolar right breast measuring 1.5 x 0.9 x 0.8 cm. - Right breast biopsy UOQ on 10/01/2021: Fibroadenomatoid nodule with calcifications, fibrocystic changes including apocrine metaplasia with no malignancy. -PET scan from 10/22/2021: Large left breast mass and numerous hypermetabolic left axillary and subpectoral lymph nodes.  2 hypermetabolic left upper lobe lung nodules measuring 7 mm with SUV 4.2 and 3.2 respectively.  No other hypermetabolic lung nodules.  No distant metastatic disease.  Nonspecific low-level subareolar activity in the right  breast near the surgical clips and a single mildly hypermetabolic right axillary lymph node. - Echocardiogram shows EF 60 to 65%. - She has oligometastatic disease with 2 small lung nodules.  Biopsy is not feasible.  I have recommended TCHP regimen followed by restaging after 3 cycles. -6 cycles of dose reduced TCHP from 11/04/2021 through 03/11/2022 - Maintenance Herceptin and pertuzumab started on 04/01/2022, anastrozole started on 04/01/2022 -Left mastectomy and lymph node dissection (05/05/2022): [id number], residual IDC, grade 2, discontinuously involving fibrotic area measuring 5.9 cm, margins negative, LVI positive, response to therapy seen including hyalin fibrosis and dystrophic calcifications.  9/18 lymph nodes positive, largest focus of metastatic carcinoma measures 1.2 cm with extranodal extension.  Right breast lumpectomy shows atypical lobular hyperplasia. - We discussed switching to Kadcyla in the adjuvant setting.  Related to efficacy of Kadcyla compared with pertuzumab and trastuzumab in the adjuvant setting is unknown.  Patient is reluctant to consider switching as she had poor tolerance with the prior chemotherapy.  Hence we have continued Herceptin and Perjeta. - XRT to the chest wall from 09/13/2022   2. Social/family history: - She lives at home with her husband and daughter.  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  Current active smoker, half pack per day since age 63. - Mother had cervical cancer and bladder cancer.  Maternal grandmother had ovarian cancer.  Brother died at age 31 from acute leukemia.  Another brother died of liver cancer.  Maternal half aunt had breast cancer.    Plan: 1. Oligometastatic (two 7 mm lung lesions) HER2 positive left breast cancer: - I have reviewed recent CT CAP from [facility].  Pulmonary nodules are stable.  No evidence of recurrence or metastatic disease. - She is tolerating anastrozole very well. - Reviewed labs today: LFTs  are normal.  Creatinine is stable at 1.46.  CBC grossly normal with mild anemia.  CA 15-3 is 36, up from 29. - I have recommended continuing Herceptin and pertuzumab every 3 weeks.  RTC 6 weeks for follow-up with repeat tumor markers.  At this time we will continue Herceptin and pertuzumab indefinitely based on scans.   2.  Right facial shingles/postherpetic right hemifacial pain: - Continue gabapentin 300 mg 3 times daily.  3.  Hypomagnesemia: - Increase home magnesium to 2 tablets twice daily.  4.  High risk drug monitoring: - 2D echo on 10/15/2022: EF 55 to 60%.  5.  Hypertension: - Continue amlodipine daily.  Blood pressure is well-controlled.  6.  Left leg DVT (diagnosed 03/04/2022): - Continue Eliquis twice daily.  No bleeding issues.  7.  Osteopenia (DEXA 04/19/2022 T score -2.1): - Continue calcium and vitamin D supplements.  No orders of the defined types were placed in this encounter.     I,Katie Daubenspeck,acting as a Neurosurgeon for [name], MD.,have documented all relevant documentation on the behalf of [name], MD,as directed by  [name], MD while in the presence of [name], MD.   I, [name] MD, have reviewed the above documentation for accuracy and completeness, and I agree with the above.   [name], MD   4/25/20246:39 PM  CHIEF COMPLAINT:   Diagnosis: locally advanced left breast cancer    Cancer Staging  Breast cancer, left [facility]) Staging form: Breast, AJCC 8th Edition - Clinical stage from 10/15/2021: Stage IIA (cT3, cN1, cM0, G2, ER+, PR+, HER2+) - Unsigned    Prior Therapy: TCHP x 6 cycles   Current Therapy:  Anastrozole daily, trastuzumab and Herceptin every 21 days   HISTORY OF PRESENT ILLNESS:   Oncology History  Breast cancer, left (HCC)  10/15/2021 Initial Diagnosis   Breast cancer, left (HCC)   11/04/2021 - 04/22/2022 Chemotherapy   Patient is on Treatment Plan : BREAST   Docetaxel + Carboplatin + Trastuzumab + Pertuzumab  (TCHP) q21d       Genetic Testing   Negative genetic testing. No pathogenic variants identified on the [facility] Multi-Cancer+RNA panel. The report date is 11/02/2021.   The Multi-Cancer Panel + RNA offered by [facility] includes sequencing and/or deletion duplication testing of the following 84 genes: AIP, ALK, APC, ATM, AXIN2,BAP1,  BARD1, BLM, BMPR1A, BRCA1, BRCA2, BRIP1, CASR, CDC73, CDH1, CDK4, CDKN1B, CDKN1C, CDKN2A (p14ARF), CDKN2A (p16INK4a), CEBPA, CHEK2, CTNNA1, DICER1, DIS3L2, EGFR (c.2369C>T, p.Thr790Met variant only), EPCAM (Deletion/duplication testing only), FH, FLCN, GATA2, GPC3, GREM1 (Promoter region deletion/duplication testing only), HOXB13 (c.251G>A, p.Gly84Glu), HRAS, KIT, MAX, MEN1, MET, MITF (c.952G>A, p.Glu318Lys variant only), MLH1, MSH2, MSH3, MSH6, MUTYH, NBN, NF1, NF2, NTHL1, PALB2, PDGFRA, PHOX2B, PMS2, POLD1, POLE, POT1, PRKAR1A, PTCH1, PTEN, RAD50, RAD51C, RAD51D, RB1, RECQL4, RET, RUNX1, SDHAF2, SDHA (sequence changes only), SDHB, SDHC, SDHD, SMAD4, SMARCA4, SMARCB1, SMARCE1, STK11, SUFU, TERC, TERT, TMEM127, TP53, TSC1, TSC2, VHL, WRN and WT1.   11/04/2021 -  Chemotherapy   Patient is on Treatment Plan : BREAST  Docetaxel + Carboplatin + Trastuzumab + Pertuzumab  (TCHP) q21d / Trastuzumab + Pertuzumab q21d        INTERVAL HISTORY:   Winola is a 67 y.o. female presenting to clinic today for follow up of locally advanced left breast cancer. She was last seen by me on 11/04/22.  Today, she states that she is doing well overall. Her appetite level is at 100%. Her energy level is at 70%.  PAST MEDICAL HISTORY:   Past Medical History: 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 DES to midLAD, 75% stenosis of RCA not treated   Cancer [facility])    Current smoker    Essential hypertension    Family history of bladder cancer    Family history of breast cancer    Family history  of leukemia    Family history of uterine cancer    Hyperlipidemia LDL goal <70    Myocardial infarction (HCC)    PONV (postoperative nausea and vomiting)    Port-A-Cath in place 10/29/2021    Surgical History: Past Surgical History:  Procedure Laterality Date   ABDOMINAL HYSTERECTOMY     BREAST BIOPSY Bilateral 10/16/2021   BREAST BIOPSY WITH RADIO FREQUENCY LOCALIZER Right 05/05/2022   Procedure: BREAST BIOPSY WITH RADIO FREQUENCY LOCALIZER;  Surgeon: Lucretia Roers, MD;  Location: [facility];  Service: General;  Laterality: Right;   CORONARY STENT INTERVENTION N/A 01/10/2018   Procedure: CORONARY STENT INTERVENTION;  Surgeon: Corky Crafts, MD;  Location: [facility] INVASIVE CV LAB;  Service: Cardiovascular;  Laterality: N/A;   LEFT HEART CATH AND CORONARY ANGIOGRAPHY N/A 01/10/2018   Procedure: LEFT HEART CATH AND CORONARY ANGIOGRAPHY;  Surgeon: Corky Crafts, MD;  Location: [facility] INVASIVE CV LAB;  Service: Cardiovascular;  Laterality: N/A;   MASTECTOMY MODIFIED RADICAL Left 05/05/2022   Procedure: MASTECTOMY MODIFIED RADICAL WITH RADIO FREQUENCY LOCALIZER;  Surgeon: Lucretia Roers, MD;  Location: [facility];  Service: General;  Laterality: Left;   PORTACATH PLACEMENT Right 10/26/2021   Procedure: INSERTION PORT-A-CATH;  Surgeon: Lucretia Roers, MD;  Location: [facility];  Service: General;  Laterality: Right;    Social History: Social History   Socioeconomic History   Marital status: Married    Spouse name: Not on file   Number of children: Not on file   Years of education: Not on file   Highest education level: Not on file  Occupational History   Not on file  Tobacco Use   Smoking status: Every Day    Packs/day: 1.00    Years: 43.00    Additional pack years: 0.00    Total pack years: 43.00    Types: Cigarettes    Start date: 1976   Smokeless tobacco: Never  Vaping Use   Vaping Use: Never used  Substance and Sexual Activity   Alcohol use: Not Currently   Drug use:  Never   Sexual activity: Yes  Other Topics Concern   Not on file  Social History Narrative   Not on file   Social Determinants of Health   Financial Resource Strain: Not on file  Food Insecurity: No Food Insecurity (05/05/2022)   Hunger Vital Sign    Worried About Running Out of Food in the Last Year: Never true    Ran Out of Food in the Last Year: Never true  Transportation Needs: No Transportation Needs (05/05/2022)   PRAPARE - Administrator, Civil Service (Medical): No    Lack of Transportation (Non-Medical): No  Physical Activity: Not on file  Stress: Not on file  Social Connections: Not on file  Intimate Partner Violence: Not At Risk (05/05/2022)   Humiliation, Afraid, Rape, and Kick questionnaire    Fear of Current or Ex-Partner: No    Emotionally  Abused: No    Physically Abused: No    Sexually Abused: No    Family History: Family History  Problem Relation Age of Onset   Bladder Cancer Mother 12   Cervical cancer Mother        dx 52s, hysterectomy   Hypertension Father    Cirrhosis Father    Leukemia Brother 74   Liver cancer Brother 24   Breast cancer Maternal Aunt        dx 58s   Cancer Maternal Uncle        unk type d. 57   Cancer Paternal Aunt        unk type   Breast cancer Maternal Grandmother        dx 94s   Uterine cancer Maternal Grandmother     Current Medications:  Current Outpatient Medications:    ALPRAZolam (XANAX) 0.25 MG tablet, TAKE 1 TABLET(0.25 MG) BY MOUTH TWICE DAILY AS NEEDED FOR ANXIETY, Disp: 60 tablet, Rfl: 0   amLODipine (NORVASC) 10 MG tablet, Take 10 mg by mouth daily., Disp: , Rfl:    anastrozole (ARIMIDEX) 1 MG tablet, Take 1 tablet (1 mg total) by mouth daily., Disp: 90 tablet, Rfl: 4   aspirin EC 81 MG tablet, Take 1 tablet (81 mg total) by mouth daily., Disp: 90 tablet, Rfl: 3   Calcium Carb-Cholecalciferol (CALCIUM 600/VITAMIN D3 PO), Take 1 tablet by mouth in the morning and at bedtime., Disp: , Rfl:     CARBOPLATIN IV, Inject into the vein every 21 ( twenty-one) days., Disp: , Rfl:    DOCEtaxel (TAXOTERE IV), Inject into the vein every 21 ( twenty-one) days., Disp: , Rfl:    Evolocumab (REPATHA SURECLICK) 140 MG/ML SOAJ, Inject 140 mg into the skin every 14 (fourteen) days., Disp: 2 mL, Rfl: 11   gabapentin (NEURONTIN) 300 MG capsule, Take 300 mg by mouth 2 (two) times daily., Disp: , Rfl:    metoprolol tartrate (LOPRESSOR) 25 MG tablet, Take 1 tablet (25 mg total) by mouth 2 (two) times daily. Please call and schedule overdue appointment with Dr. Allyson Sabal for further refills . SECOND ATTEMPT!, Disp: 30 tablet, Rfl: 0   mometasone (ELOCON) 0.1 % cream, Apply topically 2 (two) times daily., Disp: , Rfl:    omeprazole (PRILOSEC OTC) 20 MG tablet, Take 20 mg by mouth daily., Disp: , Rfl:    oxyCODONE (OXY IR/ROXICODONE) 5 MG immediate release tablet, Take 1-2 tablets (5-10 mg total) by mouth every 4 (four) hours as needed for severe pain or breakthrough pain., Disp: 30 tablet, Rfl: 0   pegfilgrastim-bmez (ZIEXTENZO) 6 MG/0.6ML injection, Inject 6 mg into the skin every 21 ( twenty-one) days. AFTER chemotherapy., Disp: , Rfl:    Pertuzumab (PERJETA IV), Inject into the vein every 21 ( twenty-one) days., Disp: , Rfl:    trastuzumab-anns (KANJINTI) 150 MG SOLR injection, Inject 462 mg into the vein every 21 ( twenty-one) days., Disp: , Rfl:    apixaban (ELIQUIS) 5 MG TABS tablet, TAKE 1 TABLET(5 MG) BY MOUTH TWICE DAILY, Disp: 60 tablet, Rfl: 5   magnesium oxide (MAG-OX) 400 (240 Mg) MG tablet, Take 2 tablets (800 mg total) by mouth 2 (two) times daily., Disp: 120 tablet, Rfl: 4   ondansetron (ZOFRAN-ODT) 4 MG disintegrating tablet, Take 1 tablet (4 mg total) by mouth every 6 (six) hours as needed for nausea. (Patient not taking: Reported on 12/16/2022), Disp: 20 tablet, Rfl: 0 No current facility-administered medications for this visit.  Facility-Administered Medications Ordered  in Other Visits:    sodium  chloride flush (NS) 0.9 % injection 10 mL, 10 mL, Intracatheter, PRN, [name], MD, 10 mL at 12/16/22 1413   Allergies: No Known Allergies  REVIEW OF SYSTEMS:   Review of Systems  Constitutional:  Negative for chills, fatigue and fever.  HENT:   Negative for lump/mass, mouth sores, nosebleeds, sore throat and trouble swallowing.   Eyes:  Negative for eye problems.  Respiratory:  Negative for cough and shortness of breath.   Cardiovascular:  Negative for chest pain, leg swelling and palpitations.  Gastrointestinal:  Negative for abdominal pain, constipation, diarrhea, nausea and vomiting.  Genitourinary:  Positive for dysuria. Negative for bladder incontinence, difficulty urinating, frequency, hematuria and nocturia.   Musculoskeletal:  Negative for arthralgias, back pain, flank pain, myalgias and neck pain.  Skin:  Negative for itching and rash.  Neurological:  Negative for dizziness, headaches and numbness.  Hematological:  Does not bruise/bleed easily.  Psychiatric/Behavioral:  Negative for depression, sleep disturbance and suicidal ideas. The patient is not nervous/anxious.   All other systems reviewed and are negative.    VITALS:   There were no vitals taken for this visit.  Wt Readings from Last 3 Encounters:  12/16/22 153 lb 14.4 oz (69.8 kg)  11/25/22 152 lb 12.8 oz (69.3 kg)  11/04/22 152 lb (68.9 kg)    There is no height or weight on file to calculate BMI.  Performance status (ECOG): 1 - Symptomatic but completely ambulatory  PHYSICAL EXAM:   Physical Exam Vitals and nursing note reviewed. Exam conducted with a chaperone present.  Constitutional:      Appearance: Normal appearance.  Cardiovascular:     Rate and Rhythm: Normal rate and regular rhythm.     Pulses: Normal pulses.     Heart sounds: Normal heart sounds.  Pulmonary:     Effort: Pulmonary effort is normal.     Breath sounds: Normal breath sounds.  Abdominal:     Palpations: Abdomen is  soft. There is no hepatomegaly, splenomegaly or mass.     Tenderness: There is no abdominal tenderness.  Musculoskeletal:     Right lower leg: No edema.     Left lower leg: No edema.  Lymphadenopathy:     Cervical: No cervical adenopathy.     Right cervical: No superficial, deep or posterior cervical adenopathy.    Left cervical: No superficial, deep or posterior cervical adenopathy.     Upper Body:     Right upper body: No supraclavicular or axillary adenopathy.     Left upper body: No supraclavicular or axillary adenopathy.  Neurological:     General: No focal deficit present.     Mental Status: She is alert and oriented to person, place, and time.  Psychiatric:        Mood and Affect: Mood normal.        Behavior: Behavior normal.     LABS:      Latest Ref Rng & Units 12/16/2022    9:00 AM 11/25/2022   11:25 AM 11/04/2022   11:44 AM  CBC  WBC 4.0 - 10.5 K/uL 6.8  5.6  6.6   Hemoglobin 12.0 - 15.0 g/dL 16.1  09.6  04.5   Hematocrit 36.0 - 46.0 % 35.8  34.2  32.3   Platelets 150 - 400 K/uL 188  213  208       Latest Ref Rng & Units 12/16/2022    9:00 AM 11/25/2022   11:25 AM  11/04/2022   11:44 AM  CMP  Glucose 70 - 99 mg/dL 161  096  045   BUN 8 - 23 mg/dL 25  32  34   Creatinine 0.44 - 1.00 mg/dL 4.09  8.11  9.14   Sodium 135 - 145 mmol/L 135  136  133   Potassium 3.5 - 5.1 mmol/L 3.9  4.1  4.3   Chloride 98 - 111 mmol/L 104  105  102   CO2 22 - 32 mmol/L Calcium 8.9 - 10.3 mg/dL 9.8  78.2  9.3   Total Protein 6.5 - 8.1 g/dL 7.0  7.0  7.0   Total Bilirubin 0.3 - 1.2 mg/dL 0.6  0.6  0.6   Alkaline Phos 38 - 126 U/L 116  122  140   AST 15 - 41 U/L ALT 0 - 44 U/L No results found for: "CEA1", "CEA" / No results found for: "CEA1", "CEA" No results found for: "PSA1" No results found for: "NFA213" No results found for: "CAN125"  No results found for: "TOTALPROTELP", "ALBUMINELP", "A1GS", "A2GS", "BETS", "BETA2SER", "GAMS",  "MSPIKE", "SPEI" No results found for: "TIBC", "FERRITIN", "IRONPCTSAT" No results found for: "LDH"   STUDIES:   No results found.

## 2022-12-16 ENCOUNTER — Inpatient Hospital Stay: Payer: Managed Care, Other (non HMO)

## 2022-12-16 ENCOUNTER — Inpatient Hospital Stay: Payer: Managed Care, Other (non HMO) | Admitting: Hematology

## 2022-12-16 VITALS — BP 133/77 | HR 62 | Temp 97.1°F | Resp 18

## 2022-12-16 DIAGNOSIS — Z95828 Presence of other vascular implants and grafts: Secondary | ICD-10-CM | POA: Diagnosis not present

## 2022-12-16 DIAGNOSIS — C50812 Malignant neoplasm of overlapping sites of left female breast: Secondary | ICD-10-CM

## 2022-12-16 DIAGNOSIS — Z170 Estrogen receptor positive status [ER+]: Secondary | ICD-10-CM | POA: Diagnosis not present

## 2022-12-16 DIAGNOSIS — I82412 Acute embolism and thrombosis of left femoral vein: Secondary | ICD-10-CM | POA: Diagnosis not present

## 2022-12-16 LAB — CBC WITH DIFFERENTIAL/PLATELET
Abs Immature Granulocytes: 0.05 10*3/uL (ref 0.00–0.07)
Basophils Absolute: 0.1 10*3/uL (ref 0.0–0.1)
Basophils Relative: 1 %
Eosinophils Absolute: 0.2 10*3/uL (ref 0.0–0.5)
Eosinophils Relative: 3 %
HCT: 35.8 % — ABNORMAL LOW (ref 36.0–46.0)
Hemoglobin: 11.5 g/dL — ABNORMAL LOW (ref 12.0–15.0)
Immature Granulocytes: 1 %
Lymphocytes Relative: 10 %
Lymphs Abs: 0.7 10*3/uL (ref 0.7–4.0)
MCH: 30.6 pg (ref 26.0–34.0)
MCHC: 32.1 g/dL (ref 30.0–36.0)
MCV: 95.2 fL (ref 80.0–100.0)
Monocytes Absolute: 0.4 10*3/uL (ref 0.1–1.0)
Monocytes Relative: 6 %
Neutro Abs: 5.4 10*3/uL (ref 1.7–7.7)
Neutrophils Relative %: 79 %
Platelets: 188 10*3/uL (ref 150–400)
RBC: 3.76 MIL/uL — ABNORMAL LOW (ref 3.87–5.11)
RDW: 16.1 % — ABNORMAL HIGH (ref 11.5–15.5)
WBC: 6.8 10*3/uL (ref 4.0–10.5)
nRBC: 0 % (ref 0.0–0.2)

## 2022-12-16 LAB — COMPREHENSIVE METABOLIC PANEL
ALT: 16 U/L (ref 0–44)
AST: 17 U/L (ref 15–41)
Albumin: 3.6 g/dL (ref 3.5–5.0)
Alkaline Phosphatase: 116 U/L (ref 38–126)
Anion gap: 9 (ref 5–15)
BUN: 25 mg/dL — ABNORMAL HIGH (ref 8–23)
CO2: 22 mmol/L (ref 22–32)
Calcium: 9.8 mg/dL (ref 8.9–10.3)
Chloride: 104 mmol/L (ref 98–111)
Creatinine, Ser: 1.46 mg/dL — ABNORMAL HIGH (ref 0.44–1.00)
GFR, Estimated: 40 mL/min — ABNORMAL LOW (ref >60)
Glucose, Bld: 129 mg/dL — ABNORMAL HIGH (ref 70–99)
Potassium: 3.9 mmol/L (ref 3.5–5.1)
Sodium: 135 mmol/L (ref 135–145)
Total Bilirubin: 0.6 mg/dL (ref 0.3–1.2)
Total Protein: 7 g/dL (ref 6.5–8.1)

## 2022-12-16 LAB — MAGNESIUM: Magnesium: 1.6 mg/dL — ABNORMAL LOW (ref 1.7–2.4)

## 2022-12-16 MED ORDER — SODIUM CHLORIDE 0.9 % IV SOLN: Freq: Once | INTRAVENOUS | Status: AC

## 2022-12-16 MED ORDER — SODIUM CHLORIDE 0.9 % IV SOLN
420.0000 mg | Freq: Once | INTRAVENOUS | Status: AC
  Administered 2022-12-16: 420 mg via INTRAVENOUS
  Filled 2022-12-16: qty 14

## 2022-12-16 MED ORDER — MAGNESIUM SULFATE 2 GM/50ML IV SOLN
2.0000 g | Freq: Once | INTRAVENOUS | Status: AC
  Administered 2022-12-16: 2 g via INTRAVENOUS
  Filled 2022-12-16: qty 50

## 2022-12-16 MED ORDER — SODIUM CHLORIDE 0.9% FLUSH
10.0000 mL | Freq: Once | INTRAVENOUS | Status: AC
  Administered 2022-12-16: 10 mL via INTRAVENOUS

## 2022-12-16 MED ORDER — APIXABAN 5 MG PO TABS
ORAL_TABLET | ORAL | 5 refills | Status: DC
Start: 2022-12-16 — End: 2023-05-30

## 2022-12-16 MED ORDER — SODIUM CHLORIDE 0.9% FLUSH
10.0000 mL | INTRAVENOUS | Status: DC | PRN
  Administered 2022-12-16: 10 mL

## 2022-12-16 MED ORDER — TRASTUZUMAB-ANNS CHEMO 150 MG IV SOLR
6.0000 mg/kg | Freq: Once | INTRAVENOUS | Status: AC
  Administered 2022-12-16: 420 mg via INTRAVENOUS
  Filled 2022-12-16: qty 20

## 2022-12-16 MED ORDER — HEPARIN SOD (PORK) LOCK FLUSH 100 UNIT/ML IV SOLN
500.0000 [IU] | Freq: Once | INTRAVENOUS | Status: AC | PRN
  Administered 2022-12-16: 500 [IU]

## 2022-12-16 MED ORDER — MAGNESIUM OXIDE -MG SUPPLEMENT 400 (240 MG) MG PO TABS: 800.0000 mg | ORAL_TABLET | Freq: Two times a day (BID) | ORAL | 4 refills | Status: DC

## 2022-12-16 NOTE — Progress Notes (Signed)
Patient has been examined by Dr. Katragadda. Vital signs and labs have been reviewed by MD - ANC, Creatinine, LFTs, hemoglobin, and platelets are within treatment parameters per M.D. - pt may proceed with treatment.  Primary RN and pharmacy notified.  

## 2022-12-16 NOTE — Patient Instructions (Signed)
[facility] Discharge Instructions   You were seen and examined today by Dr. Ellin Saba.  He reviewed the results of your lab work which are normal/stable.   He reviewed the results of your CT scan which was stable/normal.   We will proceed with your treatment today.   Return as scheduled.    Thank you for choosing [facility] at [facility] to provide your oncology and hematology care.  To afford each patient quality time with our provider, please arrive at least 15 minutes before your scheduled appointment time.   If you have a lab appointment with the Cancer Center please come in thru the Main Entrance and check in at the main information desk.  You need to re-schedule your appointment should you arrive 10 or more minutes late.  We strive to give you quality time with our providers, and arriving late affects you and other patients whose appointments are after yours.  Also, if you no show three or more times for appointments you may be dismissed from the clinic at the providers discretion.     Again, thank you for choosing [facility].  Our hope is that these requests will decrease the amount of time that you wait before being seen by our physicians.       _____________________________________________________________  Should you have questions after your visit to [facility], please contact our office at [phone] and follow the prompts.  Our office hours are 8:00 a.m. and 4:30 p.m. Monday - Friday.  Please note that voicemails left after 4:00 p.m. may not be returned until the following business day.  We are closed weekends and major holidays.  You do have access to a nurse 24-7, just call the main number to the clinic [phone] and do not press any options, hold on the line and a nurse will answer the phone.    For prescription refill requests, have your pharmacy contact our office and allow 72  hours.    Due to Covid, you will need to wear a mask upon entering the hospital. If you do not have a mask, a mask will be given to you at the Main Entrance upon arrival. For doctor visits, patients may have 1 support person age 3 or older with them. For treatment visits, patients can not have anyone with them due to social distancing guidelines and our immunocompromised population.

## 2022-12-16 NOTE — Patient Instructions (Signed)
[facility]  Discharge Instructions: Thank you for choosing [facility] to provide your oncology and hematology care.  If you have a lab appointment with the Cancer Center - please note that after April 8th, 2024, all labs will be drawn in the cancer center.  You do not have to check in or register with the main entrance as you have in the past but will complete your check-in in the cancer center.  Wear comfortable clothing and clothing appropriate for easy access to any Portacath or PICC line.   We strive to give you quality time with your provider. You may need to reschedule your appointment if you arrive late (15 or more minutes).  Arriving late affects you and other patients whose appointments are after yours.  Also, if you miss three or more appointments without notifying the office, you may be dismissed from the clinic at the provider's discretion.      For prescription refill requests, have your pharmacy contact our office and allow 72 hours for refills to be completed.    Today you received the following chemotherapy and/or immunotherapy agents Kanjinti, Perjeta, and 2g IV magnesium sulfate   To help prevent nausea and vomiting after your treatment, we encourage you to take your nausea medication as directed.  Trastuzumab Injection What is this medication? TRASTUZUMAB (tras TOO zoo mab) treats breast cancer and stomach cancer. It works by blocking a protein that causes cancer cells to grow and multiply. This helps to slow or stop the spread of cancer cells. This medicine may be used for other purposes; ask your health care provider or pharmacist if you have questions. COMMON BRAND NAME(S): Herceptin, Marlowe Alt, Ontruzant, Trazimera What should I tell my care team before I take this medication? They need to know if you have any of these conditions: Heart failure Lung disease An unusual or allergic reaction to trastuzumab, other  medications, foods, dyes, or preservatives Pregnant or trying to get pregnant Breast-feeding How should I use this medication? This medication is injected into a vein. It is given by your care team in a hospital or clinic setting. Talk to your care team about the use of this medication in children. It is not approved for use in children. Overdosage: If you think you have taken too much of this medicine contact a poison control center or emergency room at once. NOTE: This medicine is only for you. Do not share this medicine with others. What if I miss a dose? Keep appointments for follow-up doses. It is important not to miss your dose. Call your care team if you are unable to keep an appointment. What may interact with this medication? Certain types of chemotherapy, such as daunorubicin, doxorubicin, epirubicin, idarubicin This list may not describe all possible interactions. Give your health care provider a list of all the medicines, herbs, non-prescription drugs, or dietary supplements you use. Also tell them if you smoke, drink alcohol, or use illegal drugs. Some items may interact with your medicine. What should I watch for while using this medication? Your condition will be monitored carefully while you are receiving this medication. This medication may make you feel generally unwell. This is not uncommon, as chemotherapy affects healthy cells as well as cancer cells. Report any side effects. Continue your course of treatment even though you feel ill unless your care team tells you to stop. This medication may increase your risk of getting an infection. Call your care team for advice  if you get a fever, chills, sore throat, or other symptoms of a cold or flu. Do not treat yourself. Try to avoid being around people who are sick. Avoid taking medications that contain aspirin, acetaminophen, ibuprofen, naproxen, or ketoprofen unless instructed by your care team. These medications can hide a  fever. Talk to your care team if you may be pregnant. Serious birth defects can occur if you take this medication during pregnancy and for 7 months after the last dose. You will need a negative pregnancy test before starting this medication. Contraception is recommended while taking this medication and for 7 months after the last dose. Your care team can help you find the option that works for you. Do not breastfeed while taking this medication and for 7 months after stopping treatment. What side effects may I notice from receiving this medication? Side effects that you should report to your care team as soon as possible: Allergic reactions or angioedema--skin rash, itching or hives, swelling of the face, eyes, lips, tongue, arms, or legs, trouble swallowing or breathing Dry cough, shortness of breath or trouble breathing Heart failure--shortness of breath, swelling of the ankles, feet, or hands, sudden weight gain, unusual weakness or fatigue Infection--fever, chills, cough, or sore throat Infusion reactions--chest pain, shortness of breath or trouble breathing, feeling faint or lightheaded Side effects that usually do not require medical attention (report to your care team if they continue or are bothersome): Diarrhea Dizziness Headache Nausea Trouble sleeping Vomiting This list may not describe all possible side effects. Call your doctor for medical advice about side effects. You may report side effects to FDA at 1-800-FDA-1088. Where should I keep my medication? This medication is given in a hospital or clinic. It will not be stored at home. NOTE: This sheet is a summary. It may not cover all possible information. If you have questions about this medicine, talk to your doctor, pharmacist, or health care provider.  2023 Elsevier/Gold Standard (2021-12-10 00:00:00)  Pertuzumab Injection What is this medication? PERTUZUMAB (per TOOZ ue mab) treats breast cancer. It works by blocking a  protein that causes cancer cells to grow and multiply. This helps to slow or stop the spread of cancer cells. It is a monoclonal antibody. This medicine may be used for other purposes; ask your health care provider or pharmacist if you have questions. COMMON BRAND NAME(S): PERJETA What should I tell my care team before I take this medication? They need to know if you have any of these conditions: Heart failure An unusual or allergic reaction to pertuzumab, other medications, foods, dyes, or preservatives Pregnant or trying to get pregnant Breast-feeding How should I use this medication? This medication is injected into a vein. It is given by your care team in a hospital or clinic setting. Talk to your care team about the use of this medication in children. Special care may be needed. Overdosage: If you think you have taken too much of this medicine contact a poison control center or emergency room at once. NOTE: This medicine is only for you. Do not share this medicine with others. What if I miss a dose? Keep appointments for follow-up doses. It is important not to miss your dose. Call your care team if you are unable to keep an appointment. What may interact with this medication? Interactions are not expected. This list may not describe all possible interactions. Give your health care provider a list of all the medicines, herbs, non-prescription drugs, or dietary supplements you use.  Also tell them if you smoke, drink alcohol, or use illegal drugs. Some items may interact with your medicine. What should I watch for while using this medication? Your condition will be monitored carefully while you are receiving this medication. This medication may make you feel generally unwell. This is not uncommon as chemotherapy can affect healthy cells as well as cancer cells. Report any side effects. Continue your course of treatment even though you feel ill unless your care team tells you to stop. Talk to  your care team if you may be pregnant. Serious birth defects can occur if you take this medication during pregnancy and for 7 months after the last dose. You will need a negative pregnancy test before starting this medication. Contraception is recommended while taking this medication and for 7 months after the last dose. Your care team can help you find the option that works for you. Do not breastfeed while taking this medication and for 7 months after the last dose. What side effects may I notice from receiving this medication? Side effects that you should report to your care team as soon as possible: Allergic reactions or angioedema--skin rash, itching or hives, swelling of the face, eyes, lips, tongue, arms, or legs, trouble swallowing or breathing Heart failure--shortness of breath, swelling of the ankles, feet, or hands, sudden weight gain, unusual weakness or fatigue Infusion reactions--chest pain, shortness of breath or trouble breathing, feeling faint or lightheaded Side effects that usually do not require medical attention (report to your care team if they continue or are bothersome): Diarrhea Dry skin Fatigue Hair loss Nausea Vomiting This list may not describe all possible side effects. Call your doctor for medical advice about side effects. You may report side effects to FDA at 1-800-FDA-1088. Where should I keep my medication? This medication is given in a hospital or clinic. It will not be stored at home. NOTE: This sheet is a summary. It may not cover all possible information. If you have questions about this medicine, talk to your doctor, pharmacist, or health care provider.  2023 Elsevier/Gold Standard (2011-02-02 00:00:00)    BELOW ARE SYMPTOMS THAT SHOULD BE REPORTED IMMEDIATELY: *FEVER GREATER THAN 100.4 F (38 C) OR HIGHER *CHILLS OR SWEATING *NAUSEA AND VOMITING THAT IS NOT CONTROLLED WITH YOUR NAUSEA MEDICATION *UNUSUAL SHORTNESS OF BREATH *UNUSUAL BRUISING OR  BLEEDING *URINARY PROBLEMS (pain or burning when urinating, or frequent urination) *BOWEL PROBLEMS (unusual diarrhea, constipation, pain near the anus) TENDERNESS IN MOUTH AND THROAT WITH OR WITHOUT PRESENCE OF ULCERS (sore throat, sores in mouth, or a toothache) UNUSUAL RASH, SWELLING OR PAIN  UNUSUAL VAGINAL DISCHARGE OR ITCHING   Items with * indicate a potential emergency and should be followed up as soon as possible or go to the Emergency Department if any problems should occur.  Please show the CHEMOTHERAPY ALERT CARD or IMMUNOTHERAPY ALERT CARD at check-in to the Emergency Department and triage nurse.  Should you have questions after your visit or need to cancel or reschedule your appointment, please contact [facility] [phone]  and follow the prompts.  Office hours are 8:00 a.m. to 4:30 p.m. Monday - Friday. Please note that voicemails left after 4:00 p.m. may not be returned until the following business day.  We are closed weekends and major holidays. You have access to a nurse at all times for urgent questions. Please call the main number to the clinic [phone] and follow the prompts.  For any non-urgent questions, you may also contact  your provider using MyChart. We now offer e-Visits for anyone 25 and older to request care online for non-urgent symptoms. For details visit [email or web address].   Also download the MyChart app! Go to the app store, search "MyChart", open the app, select [place], and log in with your MyChart username and password.

## 2022-12-16 NOTE — Progress Notes (Signed)
Patient presents today for Kanjinti and Perjeta infusion. Patient is in satisfactory condition with no new complaints voiced.  Vital signs are stable.  Labs reviewed by Dr. Ellin Saba during the office visit and all labs are within treatment parameters.  We will proceed with treatment per MD orders.   Pt took pre-meds at home prior to arrival. Pt's magnesium is 1.6 today. Pt receive 2g IV magnesium per Dr.K's standing order.  Kanjinti, Perjeta, and 2g IV magnesium sulfate given today per MD orders. Tolerated infusion without adverse affects. Vital signs stable. No complaints at this time. Discharged from clinic ambulatory in stable condition. Alert and oriented x 3. F/U with [facility] as scheduled.

## 2022-12-17 ENCOUNTER — Other Ambulatory Visit: Payer: Self-pay

## 2022-12-17 ENCOUNTER — Encounter (HOSPITAL_COMMUNITY): Payer: Managed Care, Other (non HMO)

## 2022-12-20 ENCOUNTER — Encounter (HOSPITAL_COMMUNITY): Payer: Managed Care, Other (non HMO) | Admitting: Physical Therapy

## 2022-12-21 ENCOUNTER — Other Ambulatory Visit: Payer: Self-pay

## 2022-12-22 ENCOUNTER — Encounter (HOSPITAL_COMMUNITY): Payer: Managed Care, Other (non HMO) | Admitting: Physical Therapy

## 2022-12-24 ENCOUNTER — Encounter (HOSPITAL_COMMUNITY): Payer: Managed Care, Other (non HMO)

## 2022-12-27 ENCOUNTER — Encounter (HOSPITAL_COMMUNITY): Payer: Managed Care, Other (non HMO) | Admitting: Physical Therapy

## 2022-12-29 ENCOUNTER — Encounter (HOSPITAL_COMMUNITY): Payer: Managed Care, Other (non HMO) | Admitting: Physical Therapy

## 2022-12-31 ENCOUNTER — Encounter (HOSPITAL_COMMUNITY): Payer: Managed Care, Other (non HMO)

## 2022-12-31 ENCOUNTER — Encounter (HOSPITAL_COMMUNITY): Payer: Self-pay | Admitting: Hematology

## 2022-12-31 ENCOUNTER — Encounter: Payer: Self-pay | Admitting: Hematology

## 2023-01-05 ENCOUNTER — Encounter: Payer: Self-pay | Admitting: Hematology

## 2023-01-05 ENCOUNTER — Encounter (HOSPITAL_COMMUNITY): Payer: Self-pay | Admitting: Hematology

## 2023-01-06 ENCOUNTER — Other Ambulatory Visit: Payer: Self-pay

## 2023-01-06 DIAGNOSIS — Z170 Estrogen receptor positive status [ER+]: Secondary | ICD-10-CM

## 2023-01-07 ENCOUNTER — Inpatient Hospital Stay: Payer: Managed Care, Other (non HMO) | Attending: Oncology

## 2023-01-07 ENCOUNTER — Inpatient Hospital Stay: Payer: Managed Care, Other (non HMO)

## 2023-01-07 ENCOUNTER — Other Ambulatory Visit: Payer: Self-pay

## 2023-01-07 VITALS — BP 125/69 | HR 63 | Temp 97.8°F | Resp 18 | Wt 155.0 lb

## 2023-01-07 DIAGNOSIS — Z79899 Other long term (current) drug therapy: Secondary | ICD-10-CM | POA: Diagnosis not present

## 2023-01-07 DIAGNOSIS — C50812 Malignant neoplasm of overlapping sites of left female breast: Secondary | ICD-10-CM

## 2023-01-07 DIAGNOSIS — R300 Dysuria: Secondary | ICD-10-CM

## 2023-01-07 DIAGNOSIS — Z79811 Long term (current) use of aromatase inhibitors: Secondary | ICD-10-CM | POA: Diagnosis not present

## 2023-01-07 DIAGNOSIS — Z9012 Acquired absence of left breast and nipple: Secondary | ICD-10-CM | POA: Insufficient documentation

## 2023-01-07 DIAGNOSIS — F17210 Nicotine dependence, cigarettes, uncomplicated: Secondary | ICD-10-CM | POA: Diagnosis not present

## 2023-01-07 DIAGNOSIS — R918 Other nonspecific abnormal finding of lung field: Secondary | ICD-10-CM | POA: Insufficient documentation

## 2023-01-07 DIAGNOSIS — Z95828 Presence of other vascular implants and grafts: Secondary | ICD-10-CM

## 2023-01-07 DIAGNOSIS — Z170 Estrogen receptor positive status [ER+]: Secondary | ICD-10-CM | POA: Insufficient documentation

## 2023-01-07 DIAGNOSIS — Z5112 Encounter for antineoplastic immunotherapy: Secondary | ICD-10-CM | POA: Diagnosis present

## 2023-01-07 LAB — CBC WITH DIFFERENTIAL/PLATELET
Abs Immature Granulocytes: 0.03 10*3/uL (ref 0.00–0.07)
Basophils Absolute: 0 10*3/uL (ref 0.0–0.1)
Basophils Relative: 1 %
Eosinophils Absolute: 0.2 10*3/uL (ref 0.0–0.5)
Eosinophils Relative: 4 %
HCT: 36.1 % (ref 36.0–46.0)
Hemoglobin: 11.7 g/dL — ABNORMAL LOW (ref 12.0–15.0)
Immature Granulocytes: 1 %
Lymphocytes Relative: 16 %
Lymphs Abs: 0.9 10*3/uL (ref 0.7–4.0)
MCH: 30.8 pg (ref 26.0–34.0)
MCHC: 32.4 g/dL (ref 30.0–36.0)
MCV: 95 fL (ref 80.0–100.0)
Monocytes Absolute: 0.3 10*3/uL (ref 0.1–1.0)
Monocytes Relative: 6 %
Neutro Abs: 3.9 10*3/uL (ref 1.7–7.7)
Neutrophils Relative %: 72 %
Platelets: 180 10*3/uL (ref 150–400)
RBC: 3.8 MIL/uL — ABNORMAL LOW (ref 3.87–5.11)
RDW: 15.6 % — ABNORMAL HIGH (ref 11.5–15.5)
WBC: 5.4 10*3/uL (ref 4.0–10.5)
nRBC: 0 % (ref 0.0–0.2)

## 2023-01-07 LAB — URINALYSIS, ROUTINE W REFLEX MICROSCOPIC
Bilirubin Urine: NEGATIVE
Glucose, UA: NEGATIVE mg/dL
Hgb urine dipstick: NEGATIVE
Ketones, ur: NEGATIVE mg/dL
Nitrite: POSITIVE — AB
Protein, ur: NEGATIVE mg/dL
Specific Gravity, Urine: 1.01 (ref 1.005–1.030)
pH: 7 (ref 5.0–8.0)

## 2023-01-07 LAB — COMPREHENSIVE METABOLIC PANEL
ALT: 17 U/L (ref 0–44)
AST: 17 U/L (ref 15–41)
Albumin: 3.5 g/dL (ref 3.5–5.0)
Alkaline Phosphatase: 122 U/L (ref 38–126)
Anion gap: 9 (ref 5–15)
BUN: 30 mg/dL — ABNORMAL HIGH (ref 8–23)
CO2: 23 mmol/L (ref 22–32)
Calcium: 9.8 mg/dL (ref 8.9–10.3)
Chloride: 104 mmol/L (ref 98–111)
Creatinine, Ser: 1.48 mg/dL — ABNORMAL HIGH (ref 0.44–1.00)
GFR, Estimated: 39 mL/min — ABNORMAL LOW (ref >60)
Glucose, Bld: 121 mg/dL — ABNORMAL HIGH (ref 70–99)
Potassium: 4 mmol/L (ref 3.5–5.1)
Sodium: 136 mmol/L (ref 135–145)
Total Bilirubin: 0.6 mg/dL (ref 0.3–1.2)
Total Protein: 6.7 g/dL (ref 6.5–8.1)

## 2023-01-07 LAB — MAGNESIUM: Magnesium: 1.8 mg/dL (ref 1.7–2.4)

## 2023-01-07 MED ORDER — SODIUM CHLORIDE 0.9 % IV SOLN
420.0000 mg | Freq: Once | INTRAVENOUS | Status: AC
  Administered 2023-01-07: 420 mg via INTRAVENOUS
  Filled 2023-01-07: qty 14

## 2023-01-07 MED ORDER — SODIUM CHLORIDE 0.9% FLUSH
10.0000 mL | Freq: Once | INTRAVENOUS | Status: AC
  Administered 2023-01-07: 10 mL via INTRAVENOUS

## 2023-01-07 MED ORDER — HEPARIN SOD (PORK) LOCK FLUSH 100 UNIT/ML IV SOLN
500.0000 [IU] | Freq: Once | INTRAVENOUS | Status: AC | PRN
  Administered 2023-01-07: 500 [IU]

## 2023-01-07 MED ORDER — SODIUM CHLORIDE 0.9% FLUSH
10.0000 mL | INTRAVENOUS | Status: DC | PRN
  Administered 2023-01-07: 10 mL

## 2023-01-07 MED ORDER — TRASTUZUMAB-ANNS CHEMO 420 MG IV SOLR
6.0000 mg/kg | Freq: Once | INTRAVENOUS | Status: AC
  Administered 2023-01-07: 420 mg via INTRAVENOUS
  Filled 2023-01-07: qty 20

## 2023-01-07 MED ORDER — CIPROFLOXACIN HCL 500 MG PO TABS: 500.0000 mg | ORAL_TABLET | Freq: Two times a day (BID) | ORAL | 0 refills | Status: DC

## 2023-01-07 MED ORDER — HEPARIN SOD (PORK) LOCK FLUSH 100 UNIT/ML IV SOLN: 500.0000 [IU] | Freq: Once | INTRAVENOUS | Status: DC

## 2023-01-07 MED ORDER — SODIUM CHLORIDE 0.9 % IV SOLN: Freq: Once | INTRAVENOUS | Status: AC

## 2023-01-07 NOTE — Progress Notes (Signed)
Patient presents today for chemotherapy infusion of Kanjinti and Perjeta.  Patient is in satisfactory condition. Patient is c/o dysuria x1 week without fever. Dr Ellin Saba made aware and order given for UA. Vital signs are stable. Patient reports taking her premeds of  Claritin at 8am and Tylenol at 9am this morning. Labs reviewed and all labs are within treatment parameters.  We will proceed with treatment per MD orders.    Patient's urine positive for Nitrates. Dr Ellin Saba aware. Urine Culture order in. Patient aware of results.   Patient tolerated treatment well with no complaints voiced.  Patient left ambulatory in stable condition.  Vital signs stable at discharge.  Follow up as scheduled.

## 2023-01-07 NOTE — Progress Notes (Signed)
Patients port flushed without difficulty.  Good blood return noted with no bruising or swelling noted at site.  Stable during access and blood draw.  Patient to remain accessed for treatment. 

## 2023-01-07 NOTE — Patient Instructions (Signed)
[facility]  Discharge Instructions: Thank you for choosing [facility] to provide your oncology and hematology care.  If you have a lab appointment with the Cancer Center - please note that after April 8th, 2024, all labs will be drawn in the cancer center.  You do not have to check in or register with the main entrance as you have in the past but will complete your check-in in the cancer center.  Wear comfortable clothing and clothing appropriate for easy access to any Portacath or PICC line.   We strive to give you quality time with your provider. You may need to reschedule your appointment if you arrive late (15 or more minutes).  Arriving late affects you and other patients whose appointments are after yours.  Also, if you miss three or more appointments without notifying the office, you may be dismissed from the clinic at the provider's discretion.      For prescription refill requests, have your pharmacy contact our office and allow 72 hours for refills to be completed.    Today you received the following chemotherapy and/or immunotherapy agents Kanjinti and Perjeta.   Pertuzumab Injection What is this medication? PERTUZUMAB (per TOOZ ue mab) treats breast cancer. It works by blocking a protein that causes cancer cells to grow and multiply. This helps to slow or stop the spread of cancer cells. It is a monoclonal antibody. This medicine may be used for other purposes; ask your health care provider or pharmacist if you have questions. COMMON BRAND NAME(S): PERJETA What should I tell my care team before I take this medication? They need to know if you have any of these conditions: Heart failure An unusual or allergic reaction to pertuzumab, other medications, foods, dyes, or preservatives Pregnant or trying to get pregnant Breast-feeding How should I use this medication? This medication is injected into a vein. It is given by your care team in a  hospital or clinic setting. Talk to your care team about the use of this medication in children. Special care may be needed. Overdosage: If you think you have taken too much of this medicine contact a poison control center or emergency room at once. NOTE: This medicine is only for you. Do not share this medicine with others. What if I miss a dose? Keep appointments for follow-up doses. It is important not to miss your dose. Call your care team if you are unable to keep an appointment. What may interact with this medication? Interactions are not expected. This list may not describe all possible interactions. Give your health care provider a list of all the medicines, herbs, non-prescription drugs, or dietary supplements you use. Also tell them if you smoke, drink alcohol, or use illegal drugs. Some items may interact with your medicine. What should I watch for while using this medication? Your condition will be monitored carefully while you are receiving this medication. This medication may make you feel generally unwell. This is not uncommon as chemotherapy can affect healthy cells as well as cancer cells. Report any side effects. Continue your course of treatment even though you feel ill unless your care team tells you to stop. Talk to your care team if you may be pregnant. Serious birth defects can occur if you take this medication during pregnancy and for 7 months after the last dose. You will need a negative pregnancy test before starting this medication. Contraception is recommended while taking this medication and for 7 months after  the last dose. Your care team can help you find the option that works for you. Do not breastfeed while taking this medication and for 7 months after the last dose. What side effects may I notice from receiving this medication? Side effects that you should report to your care team as soon as possible: Allergic reactions or angioedema--skin rash, itching or hives,  swelling of the face, eyes, lips, tongue, arms, or legs, trouble swallowing or breathing Heart failure--shortness of breath, swelling of the ankles, feet, or hands, sudden weight gain, unusual weakness or fatigue Infusion reactions--chest pain, shortness of breath or trouble breathing, feeling faint or lightheaded Side effects that usually do not require medical attention (report to your care team if they continue or are bothersome): Diarrhea Dry skin Fatigue Hair loss Nausea Vomiting This list may not describe all possible side effects. Call your doctor for medical advice about side effects. You may report side effects to FDA at 1-800-FDA-1088. Where should I keep my medication? This medication is given in a hospital or clinic. It will not be stored at home. NOTE: This sheet is a summary. It may not cover all possible information. If you have questions about this medicine, talk to your doctor, pharmacist, or health care provider.  2023 Elsevier/Gold Standard (2011-02-02 00:00:00) Trastuzumab Injection What is this medication? TRASTUZUMAB (tras TOO zoo mab) treats breast cancer and stomach cancer. It works by blocking a protein that causes cancer cells to grow and multiply. This helps to slow or stop the spread of cancer cells. This medicine may be used for other purposes; ask your health care provider or pharmacist if you have questions. COMMON BRAND NAME(S): Herceptin, Marlowe Alt, Ontruzant, Trazimera What should I tell my care team before I take this medication? They need to know if you have any of these conditions: Heart failure Lung disease An unusual or allergic reaction to trastuzumab, other medications, foods, dyes, or preservatives Pregnant or trying to get pregnant Breast-feeding How should I use this medication? This medication is injected into a vein. It is given by your care team in a hospital or clinic setting. Talk to your care team about the use of this  medication in children. It is not approved for use in children. Overdosage: If you think you have taken too much of this medicine contact a poison control center or emergency room at once. NOTE: This medicine is only for you. Do not share this medicine with others. What if I miss a dose? Keep appointments for follow-up doses. It is important not to miss your dose. Call your care team if you are unable to keep an appointment. What may interact with this medication? Certain types of chemotherapy, such as daunorubicin, doxorubicin, epirubicin, idarubicin This list may not describe all possible interactions. Give your health care provider a list of all the medicines, herbs, non-prescription drugs, or dietary supplements you use. Also tell them if you smoke, drink alcohol, or use illegal drugs. Some items may interact with your medicine. What should I watch for while using this medication? Your condition will be monitored carefully while you are receiving this medication. This medication may make you feel generally unwell. This is not uncommon, as chemotherapy affects healthy cells as well as cancer cells. Report any side effects. Continue your course of treatment even though you feel ill unless your care team tells you to stop. This medication may increase your risk of getting an infection. Call your care team for advice if you get a  fever, chills, sore throat, or other symptoms of a cold or flu. Do not treat yourself. Try to avoid being around people who are sick. Avoid taking medications that contain aspirin, acetaminophen, ibuprofen, naproxen, or ketoprofen unless instructed by your care team. These medications can hide a fever. Talk to your care team if you may be pregnant. Serious birth defects can occur if you take this medication during pregnancy and for 7 months after the last dose. You will need a negative pregnancy test before starting this medication. Contraception is recommended while taking  this medication and for 7 months after the last dose. Your care team can help you find the option that works for you. Do not breastfeed while taking this medication and for 7 months after stopping treatment. What side effects may I notice from receiving this medication? Side effects that you should report to your care team as soon as possible: Allergic reactions or angioedema--skin rash, itching or hives, swelling of the face, eyes, lips, tongue, arms, or legs, trouble swallowing or breathing Dry cough, shortness of breath or trouble breathing Heart failure--shortness of breath, swelling of the ankles, feet, or hands, sudden weight gain, unusual weakness or fatigue Infection--fever, chills, cough, or sore throat Infusion reactions--chest pain, shortness of breath or trouble breathing, feeling faint or lightheaded Side effects that usually do not require medical attention (report to your care team if they continue or are bothersome): Diarrhea Dizziness Headache Nausea Trouble sleeping Vomiting This list may not describe all possible side effects. Call your doctor for medical advice about side effects. You may report side effects to FDA at 1-800-FDA-1088. Where should I keep my medication? This medication is given in a hospital or clinic. It will not be stored at home. NOTE: This sheet is a summary. It may not cover all possible information. If you have questions about this medicine, talk to your doctor, pharmacist, or health care provider.  2023 Elsevier/Gold Standard (2021-12-10 00:00:00)     To help prevent nausea and vomiting after your treatment, we encourage you to take your nausea medication as directed.  BELOW ARE SYMPTOMS THAT SHOULD BE REPORTED IMMEDIATELY: *FEVER GREATER THAN 100.4 F (38 C) OR HIGHER *CHILLS OR SWEATING *NAUSEA AND VOMITING THAT IS NOT CONTROLLED WITH YOUR NAUSEA MEDICATION *UNUSUAL SHORTNESS OF BREATH *UNUSUAL BRUISING OR BLEEDING *URINARY PROBLEMS (pain or  burning when urinating, or frequent urination) *BOWEL PROBLEMS (unusual diarrhea, constipation, pain near the anus) TENDERNESS IN MOUTH AND THROAT WITH OR WITHOUT PRESENCE OF ULCERS (sore throat, sores in mouth, or a toothache) UNUSUAL RASH, SWELLING OR PAIN  UNUSUAL VAGINAL DISCHARGE OR ITCHING   Items with * indicate a potential emergency and should be followed up as soon as possible or go to the Emergency Department if any problems should occur.  Please show the CHEMOTHERAPY ALERT CARD or IMMUNOTHERAPY ALERT CARD at check-in to the Emergency Department and triage nurse.  Should you have questions after your visit or need to cancel or reschedule your appointment, please contact [facility] AT [facility] [phone]  and follow the prompts.  Office hours are 8:00 a.m. to 4:30 p.m. Monday - Friday. Please note that voicemails left after 4:00 p.m. may not be returned until the following business day.  We are closed weekends and major holidays. You have access to a nurse at all times for urgent questions. Please call the main number to the clinic [phone] and follow the prompts.  For any non-urgent questions, you may also contact your provider using MyChart.  We now offer e-Visits for anyone 11 and older to request care online for non-urgent symptoms. For details visit [email or web address].   Also download the MyChart app! Go to the app store, search "MyChart", open the app, select [place], and log in with your MyChart username and password.

## 2023-01-08 LAB — URINE CULTURE

## 2023-01-09 LAB — URINE CULTURE: Culture: >=100000 — AB

## 2023-01-11 ENCOUNTER — Other Ambulatory Visit: Payer: Self-pay

## 2023-01-13 ENCOUNTER — Other Ambulatory Visit: Payer: Self-pay | Admitting: Physician Assistant

## 2023-01-13 DIAGNOSIS — F419 Anxiety disorder, unspecified: Secondary | ICD-10-CM

## 2023-01-22 ENCOUNTER — Encounter (HOSPITAL_COMMUNITY): Payer: Self-pay | Admitting: Hematology

## 2023-01-22 ENCOUNTER — Encounter: Payer: Self-pay | Admitting: Hematology

## 2023-01-25 ENCOUNTER — Other Ambulatory Visit: Payer: Self-pay

## 2023-01-25 DIAGNOSIS — C50812 Malignant neoplasm of overlapping sites of left female breast: Secondary | ICD-10-CM

## 2023-01-26 NOTE — Progress Notes (Signed)
[facility] [address], [place] [address]    Clinic Day:  01/27/2023  Referring physician: [name], MD  Patient Care Team: [name], MD as PCP - General (Internal Medicine) [name], MD as PCP - Cardiology (Cardiology) [name], MD as Medical Oncologist (Medical Oncology) [name], RN as Oncology Nurse Navigator (Medical Oncology)   ASSESSMENT & PLAN:   Assessment: 1. 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 - She noticed lump has gotten bigger and deformed since September 2022.  She has itching over the skin of the breast. - Left breast 4:00 biopsy: Invasive mammary carcinoma, grade 2, ER 100%, PR 70%, Ki-67 25%, HER2 FISH positive - Left breast 2:00 biopsy: Invasive ductal carcinoma, grade 2, ER 100%, PR 70%, Ki-67 25%, HER2 negative - Left axillary lymph node biopsy: IDC, ER 100%, PR 50%, HER2 negative, E-cadherin positive - Bilateral breast MRI on 10/09/2021: Multiple enhancing masses with intervening non-mass enhancement involving most of the LOQ and part of the UOQ.  Abnormal enhancement spans approximately 12.4 x 6.4 x 7 cm.  Diffuse skin thickening/edema.  Multiple left axillary lymph nodes, largest node measuring approximately 2.4 cm in the lower axilla.  Indeterminate non-mass enhancement inferior to the biopsy site in the subareolar right breast measuring 1.5 x 0.9 x 0.8 cm. - Right breast biopsy UOQ on 10/01/2021: Fibroadenomatoid nodule with calcifications, fibrocystic changes including apocrine metaplasia with no malignancy. -PET scan from 10/22/2021: Large left breast mass and numerous hypermetabolic left axillary and subpectoral lymph nodes.  2 hypermetabolic left upper lobe lung nodules measuring 7 mm with SUV 4.2 and 3.2 respectively.  No other hypermetabolic lung nodules.  No distant metastatic disease.  Nonspecific low-level subareolar activity in the right  breast near the surgical clips and a single mildly hypermetabolic right axillary lymph node. - Echocardiogram shows EF 60 to 65%. - She has oligometastatic disease with 2 small lung nodules.  Biopsy is not feasible.  I have recommended TCHP regimen followed by restaging after 3 cycles. -6 cycles of dose reduced TCHP from 11/04/2021 through 03/11/2022 - Maintenance Herceptin and pertuzumab started on 04/01/2022, anastrozole started on 04/01/2022 -Left mastectomy and lymph node dissection (05/05/2022): YPT3YPN2A, residual IDC, grade 2, discontinuously involving fibrotic area measuring 5.9 cm, margins negative, LVI positive, response to therapy seen including hyalin fibrosis and dystrophic calcifications.  9/18 lymph nodes positive, largest focus of metastatic carcinoma measures 1.2 cm with extranodal extension.  Right breast lumpectomy shows atypical lobular hyperplasia. - We discussed switching to Kadcyla in the adjuvant setting.  Related to efficacy of Kadcyla compared with pertuzumab and trastuzumab in the adjuvant setting is unknown.  Patient is reluctant to consider switching as she had poor tolerance with the prior chemotherapy.  Hence we have continued Herceptin and Perjeta. - XRT to the chest wall from 09/13/2022   2. Social/family history: - She lives at home with her husband and daughter.  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  Current active smoker, half pack per day since age 69. - Mother had cervical cancer and bladder cancer.  Maternal grandmother had ovarian cancer.  Brother died at age 79 from acute leukemia.  Another brother died of liver cancer.  Maternal half aunt had breast cancer.    Plan: 1. Oligometastatic (two 7 mm lung lesions) HER2 positive left breast cancer: - She is tolerating anastrozole very well. - CT CAP  from [facility] on 12/14/2022: Stable lung nodules. - Reviewed labs today: Normal LFTs.  CBC grossly normal.  CA 15-3 was 36.8. - Proceed with  Herceptin and pertuzumab today and every 3 weeks.  RTC 12 weeks with repeat CT CAP with contrast and tumor marker. - She still has dysuria.  Urine culture showed E. coli which was resistant to Cipro.  We will send Bactrim twice daily for 7 days.   2.  Right facial shingles/postherpetic right hemifacial pain: - Continue gabapentin 300 mg 3 times daily.  3.  Hypomagnesemia: - Continue magnesium 2 tablets twice daily.  Magnesium is normal today.  4.  High risk drug monitoring: - Last 2D echo in 09/2022 with EF 55 to 60%.  Will repeat prior to next visit.  5.  Hypertension: - Continue amlodipine daily.  Blood pressure is well-controlled.  6.  Left leg DVT (diagnosed 03/04/2022): - Continue Eliquis twice daily.  No bleeding issues.  7.  Osteopenia (DEXA 04/19/2022 T score -2.1): - Continue calcium and vitamin D supplements.    Orders Placed This Encounter  Procedures   MM Digital Screening Unilat R    Standing Status:   Future    Standing Expiration Date:   01/27/2024    Order Specific Question:   Reason for Exam (SYMPTOM  OR DIAGNOSIS REQUIRED)    Answer:   breast cancer screening    Order Specific Question:   Preferred imaging location?    Answer:   [facility]   CT CHEST ABDOMEN PELVIS W CONTRAST    Standing Status:   Future    Standing Expiration Date:   01/27/2024    Order Specific Question:   If indicated for the ordered procedure, I authorize the administration of contrast media per Radiology protocol    Answer:   Yes    Order Specific Question:   Does the patient have a contrast media/X-ray dye allergy?    Answer:   No    Order Specific Question:   Preferred imaging location?    Answer:   [facility]    Order Specific Question:   If indicated for the ordered procedure, I authorize the administration of oral contrast media per Radiology protocol    Answer:   Yes   ECHOCARDIOGRAM COMPLETE    Standing Status:   Future    Standing Expiration Date:   01/27/2024     Order Specific Question:   Where should this test be performed    Answer:   [facility]    Order Specific Question:   Perflutren DEFINITY (image enhancing agent) should be administered unless hypersensitivity or allergy exist    Answer:   Administer Perflutren    Order Specific Question:   Is a special reader required? (athlete or structural heart)    Answer:   No    Order Specific Question:   Does this study need to be read by the Structural team/Level 3 readers?    Answer:   No    Order Specific Question:   Reason for exam-Echo    Answer:   Chemo  Z09      I,Katie Daubenspeck,acting as a scribe for [name], MD.,have documented all relevant documentation on the behalf of [name], MD,as directed by  [name], MD while in the presence of [name], MD.   I, [name] MD, have reviewed the above documentation for accuracy and completeness, and I agree with the above.   [name], MD  6/6/20246:08 PM  CHIEF COMPLAINT:   Diagnosis: locally advanced left breast cancer    Cancer Staging  Breast cancer, left (HCC) Staging form: Breast, AJCC 8th Edition - Clinical stage from 10/15/2021: Stage IIA (cT3, cN1, cM0, G2, ER+, PR+, HER2+) - Unsigned    Prior Therapy: TCHP x 6 cycles   Current Therapy:  Anastrozole daily, trastuzumab and Herceptin every 21 days    HISTORY OF PRESENT ILLNESS:   Oncology History  Breast cancer, left (HCC)  10/15/2021 Initial Diagnosis   Breast cancer, left (HCC)   11/04/2021 - 04/22/2022 Chemotherapy   Patient is on Treatment Plan : BREAST  Docetaxel + Carboplatin + Trastuzumab + Pertuzumab  (TCHP) q21d       Genetic Testing   Negative genetic testing. No pathogenic variants identified on the [facility] Multi-Cancer+RNA panel. The report date is 11/02/2021.   The Multi-Cancer Panel + RNA offered by [facility] includes sequencing and/or deletion duplication testing of the following 84 genes: AIP,  ALK, APC, ATM, AXIN2,BAP1,  BARD1, BLM, BMPR1A, BRCA1, BRCA2, BRIP1, CASR, CDC73, CDH1, CDK4, CDKN1B, CDKN1C, CDKN2A (p14ARF), CDKN2A (p16INK4a), CEBPA, CHEK2, CTNNA1, DICER1, DIS3L2, EGFR (c.2369C>T, p.Thr790Met variant only), EPCAM (Deletion/duplication testing only), FH, FLCN, GATA2, GPC3, GREM1 (Promoter region deletion/duplication testing only), HOXB13 (c.251G>A, p.Gly84Glu), HRAS, KIT, MAX, MEN1, MET, MITF (c.952G>A, p.Glu318Lys variant only), MLH1, MSH2, MSH3, MSH6, MUTYH, NBN, NF1, NF2, NTHL1, PALB2, PDGFRA, PHOX2B, PMS2, POLD1, POLE, POT1, PRKAR1A, PTCH1, PTEN, RAD50, RAD51C, RAD51D, RB1, RECQL4, RET, RUNX1, SDHAF2, SDHA (sequence changes only), SDHB, SDHC, SDHD, SMAD4, SMARCA4, SMARCB1, SMARCE1, STK11, SUFU, TERC, TERT, TMEM127, TP53, TSC1, TSC2, VHL, WRN and WT1.   11/04/2021 -  Chemotherapy   Patient is on Treatment Plan : BREAST  Docetaxel + Carboplatin + Trastuzumab + Pertuzumab  (TCHP) q21d / Trastuzumab + Pertuzumab q21d        INTERVAL HISTORY:   Traci Fuller is a 67 y.o. female presenting to clinic today for follow up of locally advanced left breast cancer. She was last seen by me on 12/16/22.  Today, she states that she is doing well overall. Her appetite level is at 100%. Her energy level is at 50%.  PAST MEDICAL HISTORY:   Past Medical History: 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 DES to midLAD, 75% stenosis of RCA not treated   Cancer [facility])    Current smoker    Essential hypertension    Family history of bladder cancer    Family history of breast cancer    Family history of leukemia    Family history of uterine cancer    Hyperlipidemia LDL goal <70    Myocardial infarction (HCC)    PONV (postoperative nausea and vomiting)    Port-A-Cath in place 10/29/2021    Surgical History: Past Surgical History:  Procedure Laterality Date   ABDOMINAL HYSTERECTOMY     BREAST BIOPSY Bilateral 10/16/2021   BREAST BIOPSY WITH RADIO  FREQUENCY LOCALIZER Right 05/05/2022   Procedure: BREAST BIOPSY WITH RADIO FREQUENCY LOCALIZER;  Surgeon: Lucretia Roers, MD;  Location: [facility] ORS;  Service: General;  Laterality: Right;   CORONARY STENT INTERVENTION N/A 01/10/2018   Procedure: CORONARY STENT INTERVENTION;  Surgeon: Corky Crafts, MD;  Location: [facility] INVASIVE CV LAB;  Service: Cardiovascular;  Laterality: N/A;   LEFT HEART CATH AND CORONARY ANGIOGRAPHY N/A 01/10/2018   Procedure: LEFT HEART CATH AND CORONARY ANGIOGRAPHY;  Surgeon: Corky Crafts, MD;  Location: [facility] INVAS[facility] CV LAB;  Service: Cardiovascular;  Laterality: N/A;   MASTECTOMY MODIFIED RADICAL Left 05/05/2022   Procedure: MASTECTOMY MODIFIED RADICAL WITH RADIO FREQUENCY LOCALIZER;  Surgeon: Lucretia Roers, MD;  Location: [facility];  Service: General;  Laterality: Left;   PORTACATH PLACEMENT Right 10/26/2021   Procedure: INSERTION PORT-A-CATH;  Surgeon: Lucretia Roers, MD;  Location: [facility];  Service: General;  Laterality: Right;    Social History: Social History   Socioeconomic History   Marital status: Married    Spouse name: Not on file   Number of children: Not on file   Years of education: Not on file   Highest education level: Not on file  Occupational History   Not on file  Tobacco Use   Smoking status: Every Day    Packs/day: 1.00    Years: 43.00    Additional pack years: 0.00    Total pack years: 43.00    Types: Cigarettes    Start date: 1976   Smokeless tobacco: Never  Vaping Use   Vaping Use: Never used  Substance and Sexual Activity   Alcohol use: Not Currently   Drug use: Never   Sexual activity: Yes  Other Topics Concern   Not on file  Social History Narrative   Not on file   Social Determinants of Health   Financial Resource Strain: Not on file  Food Insecurity: No Food Insecurity (05/05/2022)   Hunger Vital Sign    Worried About Running Out of Food in the Last Year: Never true    Ran Out of Food in the Last Year:  Never true  Transportation Needs: No Transportation Needs (05/05/2022)   PRAPARE - Administrator, Civil Service (Medical): No    Lack of Transportation (Non-Medical): No  Physical Activity: Not on file  Stress: Not on file  Social Connections: Not on file  Intimate Partner Violence: Not At Risk (05/05/2022)   Humiliation, Afraid, Rape, and Kick questionnaire    Fear of Current or Ex-Partner: No    Emotionally Abused: No    Physically Abused: No    Sexually Abused: No    Family History: Family History  Problem Relation Age of Onset   Bladder Cancer Mother 75   Cervical cancer Mother        dx 43s, hysterectomy   Hypertension Father    Cirrhosis Father    Leukemia Brother 75   Liver cancer Brother 59   Breast cancer Maternal Aunt        dx 54s   Cancer Maternal Uncle        unk type d. 15   Cancer Paternal Aunt        unk type   Breast cancer Maternal Grandmother        dx 48s   Uterine cancer Maternal Grandmother     Current Medications:  Current Outpatient Medications:    ALPRAZolam (XANAX) 0.25 MG tablet, TAKE 1 TABLET(0.25 MG) BY MOUTH TWICE DAILY AS NEEDED FOR ANXIETY, Disp: 60 tablet, Rfl: 0   amLODipine (NORVASC) 10 MG tablet, Take 10 mg by mouth daily., Disp: , Rfl:    anastrozole (ARIMIDEX) 1 MG tablet, Take 1 tablet (1 mg total) by mouth daily., Disp: 90 tablet, Rfl: 4   apixaban (ELIQUIS) 5 MG TABS tablet, TAKE 1 TABLET(5 MG) BY MOUTH TWICE DAILY, Disp: 60 tablet, Rfl: 5   aspirin EC 81 MG tablet, Take 1 tablet (81 mg total) by mouth daily., Disp: 90 tablet, Rfl: 3   Calcium  Carb-Cholecalciferol (CALCIUM 600/VITAMIN D3 PO), Take 1 tablet by mouth in the morning and at bedtime., Disp: , Rfl:    CARBOPLATIN IV, Inject into the vein every 21 ( twenty-one) days., Disp: , Rfl:    DOCEtaxel (TAXOTERE IV), Inject into the vein every 21 ( twenty-one) days., Disp: , Rfl:    Evolocumab (REPATHA SURECLICK) 140 MG/ML SOAJ, Inject 140 mg into the skin every 14  (fourteen) days., Disp: 2 mL, Rfl: 11   gabapentin (NEURONTIN) 300 MG capsule, Take 300 mg by mouth 2 (two) times daily., Disp: , Rfl:    magnesium oxide (MAG-OX) 400 (240 Mg) MG tablet, Take 2 tablets (800 mg total) by mouth 2 (two) times daily., Disp: 120 tablet, Rfl: 4   metoprolol tartrate (LOPRESSOR) 25 MG tablet, Take 1 tablet (25 mg total) by mouth 2 (two) times daily. Please call and schedule overdue appointment with Dr. Allyson Sabal for further refills . SECOND ATTEMPT!, Disp: 30 tablet, Rfl: 0   mometasone (ELOCON) 0.1 % cream, Apply topically 2 (two) times daily., Disp: , Rfl:    omeprazole (PRILOSEC OTC) 20 MG tablet, Take 20 mg by mouth daily., Disp: , Rfl:    ondansetron (ZOFRAN-ODT) 4 MG disintegrating tablet, Take 1 tablet (4 mg total) by mouth every 6 (six) hours as needed for nausea., Disp: 20 tablet, Rfl: 0   oxyCODONE (OXY IR/ROXICODONE) 5 MG immediate release tablet, Take 1-2 tablets (5-10 mg total) by mouth every 4 (four) hours as needed for severe pain or breakthrough pain., Disp: 30 tablet, Rfl: 0   pegfilgrastim-bmez (ZIEXTENZO) 6 MG/0.6ML injection, Inject 6 mg into the skin every 21 ( twenty-one) days. AFTER chemotherapy., Disp: , Rfl:    Pertuzumab (PERJETA IV), Inject into the vein every 21 ( twenty-one) days., Disp: , Rfl:    sulfamethoxazole-trimethoprim (BACTRIM DS) 800-160 MG tablet, Take 1 tablet by mouth 2 (two) times daily., Disp: 14 tablet, Rfl: 0   trastuzumab-anns (KANJINTI) 150 MG SOLR injection, Inject 462 mg into the vein every 21 ( twenty-one) days., Disp: , Rfl:   Current Facility-Administered Medications:    sodium chloride flush (NS) 0.9 % injection 10 mL, 10 mL, Intravenous, PRN, [name], MD, 10 mL at 01/27/23 1610  Facility-Administered Medications Ordered in Other Visits:    sodium chloride flush (NS) 0.9 % injection 10 mL, 10 mL, Intracatheter, PRN, [name], MD, 10 mL at 01/27/23 1322   Allergies: No Known Allergies  REVIEW OF  SYSTEMS:   Review of Systems  Constitutional:  Negative for chills, fatigue and fever.  HENT:   Negative for lump/mass, mouth sores, nosebleeds, sore throat and trouble swallowing.   Eyes:  Negative for eye problems.  Respiratory:  Negative for cough and shortness of breath.   Cardiovascular:  Negative for chest pain, leg swelling and palpitations.  Gastrointestinal:  Negative for abdominal pain, constipation, diarrhea, nausea and vomiting.  Genitourinary:  Positive for dysuria. Negative for bladder incontinence, difficulty urinating, frequency, hematuria and nocturia.   Musculoskeletal:  Negative for arthralgias, back pain, flank pain, myalgias and neck pain.  Skin:  Negative for itching and rash.  Neurological:  Negative for dizziness, headaches and numbness.  Hematological:  Does not bruise/bleed easily.  Psychiatric/Behavioral:  Negative for depression, sleep disturbance and suicidal ideas. The patient is not nervous/anxious.   All other systems reviewed and are negative.    VITALS:   Blood pressure (!) 146/80.  Wt Readings from Last 3 Encounters:  01/27/23 155 lb 3.2 oz (70.4 kg)  01/07/23  155 lb (70.3 kg)  12/16/22 153 lb 14.4 oz (69.8 kg)    There is no height or weight on file to calculate BMI.  Performance status (ECOG): 1 - Symptomatic but completely ambulatory  PHYSICAL EXAM:   Physical Exam Vitals and nursing note reviewed. Exam conducted with a chaperone present.  Constitutional:      Appearance: Normal appearance.  Cardiovascular:     Rate and Rhythm: Normal rate and regular rhythm.     Pulses: Normal pulses.     Heart sounds: Normal heart sounds.  Pulmonary:     Effort: Pulmonary effort is normal.     Breath sounds: Normal breath sounds.  Abdominal:     Palpations: Abdomen is soft. There is no hepatomegaly, splenomegaly or mass.     Tenderness: There is no abdominal tenderness.  Musculoskeletal:     Right lower leg: No edema.     Left lower leg: No  edema.  Lymphadenopathy:     Cervical: No cervical adenopathy.     Right cervical: No superficial, deep or posterior cervical adenopathy.    Left cervical: No superficial, deep or posterior cervical adenopathy.     Upper Body:     Right upper body: No supraclavicular or axillary adenopathy.     Left upper body: No supraclavicular or axillary adenopathy.  Neurological:     General: No focal deficit present.     Mental Status: She is alert and oriented to person, place, and time.  Psychiatric:        Mood and Affect: Mood normal.        Behavior: Behavior normal.     LABS:      Latest Ref Rng & Units 01/27/2023    9:35 AM 01/07/2023    9:24 AM 12/16/2022    9:00 AM  CBC  WBC 4.0 - 10.5 K/uL 6.0  5.4  6.8   Hemoglobin 12.0 - 15.0 g/dL 56.3  87.5  64.3   Hematocrit 36.0 - 46.0 % 35.5  36.1  35.8   Platelets 150 - 400 K/uL 186  180  188       Latest Ref Rng & Units 01/27/2023    9:35 AM 01/07/2023    9:24 AM 12/16/2022    9:00 AM  CMP  Glucose 70 - 99 mg/dL 329  518  841   BUN 8 - 23 mg/dL 29  30  25    Creatinine 0.44 - 1.00 mg/dL 6.60  6.30  1.60   Sodium 135 - 145 mmol/L 136  136  135   Potassium 3.5 - 5.1 mmol/L 4.2  4.0  3.9   Chloride 98 - 111 mmol/L 104  104  104   CO2 22 - 32 mmol/L 24  23  22    Calcium 8.9 - 10.3 mg/dL 9.7  9.8  9.8   Total Protein 6.5 - 8.1 g/dL 6.8  6.7  7.0   Total Bilirubin 0.3 - 1.2 mg/dL 0.6  0.6  0.6   Alkaline Phos 38 - 126 U/L 107  122  116   AST 15 - 41 U/L 16  17  17    ALT 0 - 44 U/L 14  17  16       No results found for: "CEA1", "CEA" / No results found for: "CEA1", "CEA" No results found for: "PSA1" No results found for: "FUX323" No results found for: "CAN125"  No results found for: "TOTALPROTELP", "ALBUMINELP", "A1GS", "A2GS", "BETS", "BETA2SER", "GAMS", "MSPIKE", "SPEI" No results found for: "  TIBC", "FERRITIN", "IRONPCTSAT" No results found for: "LDH"   STUDIES:   No results found.

## 2023-01-27 ENCOUNTER — Inpatient Hospital Stay: Payer: Managed Care, Other (non HMO) | Attending: Oncology | Admitting: Hematology

## 2023-01-27 ENCOUNTER — Inpatient Hospital Stay: Payer: Managed Care, Other (non HMO)

## 2023-01-27 ENCOUNTER — Encounter: Payer: Self-pay | Admitting: Hematology

## 2023-01-27 ENCOUNTER — Encounter (HOSPITAL_COMMUNITY): Payer: Self-pay | Admitting: Hematology

## 2023-01-27 VITALS — BP 145/83 | HR 62 | Temp 97.4°F | Resp 17

## 2023-01-27 VITALS — BP 146/80

## 2023-01-27 DIAGNOSIS — Z79811 Long term (current) use of aromatase inhibitors: Secondary | ICD-10-CM | POA: Diagnosis not present

## 2023-01-27 DIAGNOSIS — Z1231 Encounter for screening mammogram for malignant neoplasm of breast: Secondary | ICD-10-CM

## 2023-01-27 DIAGNOSIS — C50912 Malignant neoplasm of unspecified site of left female breast: Secondary | ICD-10-CM

## 2023-01-27 DIAGNOSIS — C50412 Malignant neoplasm of upper-outer quadrant of left female breast: Secondary | ICD-10-CM | POA: Insufficient documentation

## 2023-01-27 DIAGNOSIS — Z170 Estrogen receptor positive status [ER+]: Secondary | ICD-10-CM | POA: Insufficient documentation

## 2023-01-27 DIAGNOSIS — C50212 Malignant neoplasm of upper-inner quadrant of left female breast: Secondary | ICD-10-CM | POA: Insufficient documentation

## 2023-01-27 DIAGNOSIS — Z95828 Presence of other vascular implants and grafts: Secondary | ICD-10-CM

## 2023-01-27 DIAGNOSIS — Z9012 Acquired absence of left breast and nipple: Secondary | ICD-10-CM | POA: Diagnosis not present

## 2023-01-27 DIAGNOSIS — Z5112 Encounter for antineoplastic immunotherapy: Secondary | ICD-10-CM | POA: Insufficient documentation

## 2023-01-27 DIAGNOSIS — F17210 Nicotine dependence, cigarettes, uncomplicated: Secondary | ICD-10-CM | POA: Diagnosis not present

## 2023-01-27 DIAGNOSIS — Z79899 Other long term (current) drug therapy: Secondary | ICD-10-CM | POA: Diagnosis not present

## 2023-01-27 DIAGNOSIS — R918 Other nonspecific abnormal finding of lung field: Secondary | ICD-10-CM | POA: Insufficient documentation

## 2023-01-27 LAB — MAGNESIUM: Magnesium: 1.7 mg/dL (ref 1.7–2.4)

## 2023-01-27 LAB — COMPREHENSIVE METABOLIC PANEL
ALT: 14 U/L (ref 0–44)
AST: 16 U/L (ref 15–41)
Albumin: 3.6 g/dL (ref 3.5–5.0)
Alkaline Phosphatase: 107 U/L (ref 38–126)
Anion gap: 8 (ref 5–15)
BUN: 29 mg/dL — ABNORMAL HIGH (ref 8–23)
CO2: 24 mmol/L (ref 22–32)
Calcium: 9.7 mg/dL (ref 8.9–10.3)
Chloride: 104 mmol/L (ref 98–111)
Creatinine, Ser: 1.46 mg/dL — ABNORMAL HIGH (ref 0.44–1.00)
GFR, Estimated: 40 mL/min — ABNORMAL LOW (ref >60)
Glucose, Bld: 119 mg/dL — ABNORMAL HIGH (ref 70–99)
Potassium: 4.2 mmol/L (ref 3.5–5.1)
Sodium: 136 mmol/L (ref 135–145)
Total Bilirubin: 0.6 mg/dL (ref 0.3–1.2)
Total Protein: 6.8 g/dL (ref 6.5–8.1)

## 2023-01-27 LAB — CBC WITH DIFFERENTIAL/PLATELET
Abs Immature Granulocytes: 0.03 10*3/uL (ref 0.00–0.07)
Basophils Absolute: 0 10*3/uL (ref 0.0–0.1)
Basophils Relative: 1 %
Eosinophils Absolute: 0.2 10*3/uL (ref 0.0–0.5)
Eosinophils Relative: 4 %
HCT: 35.5 % — ABNORMAL LOW (ref 36.0–46.0)
Hemoglobin: 11.3 g/dL — ABNORMAL LOW (ref 12.0–15.0)
Immature Granulocytes: 1 %
Lymphocytes Relative: 12 %
Lymphs Abs: 0.7 10*3/uL (ref 0.7–4.0)
MCH: 30.3 pg (ref 26.0–34.0)
MCHC: 31.8 g/dL (ref 30.0–36.0)
MCV: 95.2 fL (ref 80.0–100.0)
Monocytes Absolute: 0.4 10*3/uL (ref 0.1–1.0)
Monocytes Relative: 7 %
Neutro Abs: 4.6 10*3/uL (ref 1.7–7.7)
Neutrophils Relative %: 75 %
Platelets: 186 10*3/uL (ref 150–400)
RBC: 3.73 MIL/uL — ABNORMAL LOW (ref 3.87–5.11)
RDW: 15.7 % — ABNORMAL HIGH (ref 11.5–15.5)
WBC: 6 10*3/uL (ref 4.0–10.5)
nRBC: 0 % (ref 0.0–0.2)

## 2023-01-27 MED ORDER — HEPARIN SOD (PORK) LOCK FLUSH 100 UNIT/ML IV SOLN
500.0000 [IU] | Freq: Once | INTRAVENOUS | Status: AC | PRN
  Administered 2023-01-27: 500 [IU]

## 2023-01-27 MED ORDER — SODIUM CHLORIDE 0.9 % IV SOLN: Freq: Once | INTRAVENOUS | Status: AC

## 2023-01-27 MED ORDER — TRASTUZUMAB-ANNS CHEMO 150 MG IV SOLR
6.0000 mg/kg | Freq: Once | INTRAVENOUS | Status: AC
  Administered 2023-01-27: 420 mg via INTRAVENOUS
  Filled 2023-01-27: qty 20

## 2023-01-27 MED ORDER — SODIUM CHLORIDE 0.9% FLUSH
10.0000 mL | INTRAVENOUS | Status: DC | PRN
  Administered 2023-01-27: 10 mL

## 2023-01-27 MED ORDER — SODIUM CHLORIDE 0.9 % IV SOLN
420.0000 mg | Freq: Once | INTRAVENOUS | Status: AC
  Administered 2023-01-27: 420 mg via INTRAVENOUS
  Filled 2023-01-27: qty 14

## 2023-01-27 MED ORDER — SODIUM CHLORIDE 0.9% FLUSH
10.0000 mL | INTRAVENOUS | Status: DC | PRN
  Administered 2023-01-27: 10 mL via INTRAVENOUS

## 2023-01-27 MED ORDER — SULFAMETHOXAZOLE-TRIMETHOPRIM 800-160 MG PO TABS: 1.0000 | ORAL_TABLET | Freq: Two times a day (BID) | ORAL | 0 refills | Status: AC

## 2023-01-27 NOTE — Progress Notes (Signed)
Patient presents today for Kanjinti/Perjeta infusion. Patient is in satisfactory condition with no new complaints voiced.  Vital signs are stable.  Labs reviewed by Dr. Ellin Saba during the office visit and all labs are within treatment parameters.  We will proceed with treatment per MD orders.   Pt took pre-meds at home prior to arrival.  Treatment given today per MD orders. Tolerated infusion without adverse affects. Vital signs stable. No complaints at this time. Discharged from clinic ambulatory in stable condition. Alert and oriented x 3. F/U with [facility] as scheduled.

## 2023-01-27 NOTE — Patient Instructions (Addendum)
[facility] at [facility] Discharge Instructions   You were seen and examined today by Dr. Ellin Saba.  He reviewed the results of your lab work which are normal/stable.   We will proceed with your treatment today.   Continue magnesium as prescribed.   We sent a prescription to your pharmacy for Bactrim to treat your UTI. Take as prescribed until completed.   We will obtain a mammogram of your right breast for screening purposes.   Return as scheduled.    Thank you for choosing [facility] at [facility] to provide your oncology and hematology care.  To afford each patient quality time with our provider, please arrive at least 15 minutes before your scheduled appointment time.   If you have a lab appointment with the Cancer Center please come in thru the Main Entrance and check in at the main information desk.  You need to re-schedule your appointment should you arrive 10 or more minutes late.  We strive to give you quality time with our providers, and arriving late affects you and other patients whose appointments are after yours.  Also, if you no show three or more times for appointments you may be dismissed from the clinic at the providers discretion.     Again, thank you for choosing [facility].  Our hope is that these requests will decrease the amount of time that you wait before being seen by our physicians.       _____________________________________________________________  Should you have questions after your visit to [facility], please contact our office at [phone] and follow the prompts.  Our office hours are 8:00 a.m. and 4:30 p.m. Monday - Friday.  Please note that voicemails left after 4:00 p.m. may not be returned until the following business day.  We are closed weekends and major holidays.  You do have access to a nurse 24-7, just call the main number to the clinic [phone] and do not press  any options, hold on the line and a nurse will answer the phone.    For prescription refill requests, have your pharmacy contact our office and allow 72 hours.    Due to Covid, you will need to wear a mask upon entering the hospital. If you do not have a mask, a mask will be given to you at the Main Entrance upon arrival. For doctor visits, patients may have 1 support person age 1 or older with them. For treatment visits, patients can not have anyone with them due to social distancing guidelines and our immunocompromised population.

## 2023-01-27 NOTE — Patient Instructions (Signed)
[facility]  Discharge Instructions: Thank you for choosing [facility] to provide your oncology and hematology care.  If you have a lab appointment with the Cancer Center - please note that after April 8th, 2024, all labs will be drawn in the cancer center.  You do not have to check in or register with the main entrance as you have in the past but will complete your check-in in the cancer center.  Wear comfortable clothing and clothing appropriate for easy access to any Portacath or PICC line.   We strive to give you quality time with your provider. You may need to reschedule your appointment if you arrive late (15 or more minutes).  Arriving late affects you and other patients whose appointments are after yours.  Also, if you miss three or more appointments without notifying the office, you may be dismissed from the clinic at the provider's discretion.      For prescription refill requests, have your pharmacy contact our office and allow 72 hours for refills to be completed.    Today you received the following chemotherapy and/or immunotherapy agents Kanjinti and Perjeta   To help prevent nausea and vomiting after your treatment, we encourage you to take your nausea medication as directed.  Trastuzumab Injection What is this medication? TRASTUZUMAB (tras TOO zoo mab) treats breast cancer and stomach cancer. It works by blocking a protein that causes cancer cells to grow and multiply. This helps to slow or stop the spread of cancer cells. This medicine may be used for other purposes; ask your health care provider or pharmacist if you have questions. COMMON BRAND NAME(S): Herceptin, Marlowe Alt, Ontruzant, Trazimera What should I tell my care team before I take this medication? They need to know if you have any of these conditions: Heart failure Lung disease An unusual or allergic reaction to trastuzumab, other medications, foods, dyes, or  preservatives Pregnant or trying to get pregnant Breast-feeding How should I use this medication? This medication is injected into a vein. It is given by your care team in a hospital or clinic setting. Talk to your care team about the use of this medication in children. It is not approved for use in children. Overdosage: If you think you have taken too much of this medicine contact a poison control center or emergency room at once. NOTE: This medicine is only for you. Do not share this medicine with others. What if I miss a dose? Keep appointments for follow-up doses. It is important not to miss your dose. Call your care team if you are unable to keep an appointment. What may interact with this medication? Certain types of chemotherapy, such as daunorubicin, doxorubicin, epirubicin, idarubicin This list may not describe all possible interactions. Give your health care provider a list of all the medicines, herbs, non-prescription drugs, or dietary supplements you use. Also tell them if you smoke, drink alcohol, or use illegal drugs. Some items may interact with your medicine. What should I watch for while using this medication? Your condition will be monitored carefully while you are receiving this medication. This medication may make you feel generally unwell. This is not uncommon, as chemotherapy affects healthy cells as well as cancer cells. Report any side effects. Continue your course of treatment even though you feel ill unless your care team tells you to stop. This medication may increase your risk of getting an infection. Call your care team for advice if you get a  fever, chills, sore throat, or other symptoms of a cold or flu. Do not treat yourself. Try to avoid being around people who are sick. Avoid taking medications that contain aspirin, acetaminophen, ibuprofen, naproxen, or ketoprofen unless instructed by your care team. These medications can hide a fever. Talk to your care team if  you may be pregnant. Serious birth defects can occur if you take this medication during pregnancy and for 7 months after the last dose. You will need a negative pregnancy test before starting this medication. Contraception is recommended while taking this medication and for 7 months after the last dose. Your care team can help you find the option that works for you. Do not breastfeed while taking this medication and for 7 months after stopping treatment. What side effects may I notice from receiving this medication? Side effects that you should report to your care team as soon as possible: Allergic reactions or angioedema--skin rash, itching or hives, swelling of the face, eyes, lips, tongue, arms, or legs, trouble swallowing or breathing Dry cough, shortness of breath or trouble breathing Heart failure--shortness of breath, swelling of the ankles, feet, or hands, sudden weight gain, unusual weakness or fatigue Infection--fever, chills, cough, or sore throat Infusion reactions--chest pain, shortness of breath or trouble breathing, feeling faint or lightheaded Side effects that usually do not require medical attention (report to your care team if they continue or are bothersome): Diarrhea Dizziness Headache Nausea Trouble sleeping Vomiting This list may not describe all possible side effects. Call your doctor for medical advice about side effects. You may report side effects to FDA at 1-800-FDA-1088. Where should I keep my medication? This medication is given in a hospital or clinic. It will not be stored at home. NOTE: This sheet is a summary. It may not cover all possible information. If you have questions about this medicine, talk to your doctor, pharmacist, or health care provider.  2024 Elsevier/Gold Standard (2021-12-22 00:00:00)  Pertuzumab Injection What is this medication? PERTUZUMAB (per TOOZ ue mab) treats breast cancer. It works by blocking a protein that causes cancer cells to  grow and multiply. This helps to slow or stop the spread of cancer cells. It is a monoclonal antibody. This medicine may be used for other purposes; ask your health care provider or pharmacist if you have questions. COMMON BRAND NAME(S): PERJETA What should I tell my care team before I take this medication? They need to know if you have any of these conditions: Heart failure An unusual or allergic reaction to pertuzumab, other medications, foods, dyes, or preservatives Pregnant or trying to get pregnant Breast-feeding How should I use this medication? This medication is injected into a vein. It is given by your care team in a hospital or clinic setting. Talk to your care team about the use of this medication in children. Special care may be needed. Overdosage: If you think you have taken too much of this medicine contact a poison control center or emergency room at once. NOTE: This medicine is only for you. Do not share this medicine with others. What if I miss a dose? Keep appointments for follow-up doses. It is important not to miss your dose. Call your care team if you are unable to keep an appointment. What may interact with this medication? Interactions are not expected. This list may not describe all possible interactions. Give your health care provider a list of all the medicines, herbs, non-prescription drugs, or dietary supplements you use. Also tell them if  you smoke, drink alcohol, or use illegal drugs. Some items may interact with your medicine. What should I watch for while using this medication? Your condition will be monitored carefully while you are receiving this medication. This medication may make you feel generally unwell. This is not uncommon as chemotherapy can affect healthy cells as well as cancer cells. Report any side effects. Continue your course of treatment even though you feel ill unless your care team tells you to stop. Talk to your care team if you may be  pregnant. Serious birth defects can occur if you take this medication during pregnancy and for 7 months after the last dose. You will need a negative pregnancy test before starting this medication. Contraception is recommended while taking this medication and for 7 months after the last dose. Your care team can help you find the option that works for you. Do not breastfeed while taking this medication and for 7 months after the last dose. What side effects may I notice from receiving this medication? Side effects that you should report to your care team as soon as possible: Allergic reactions or angioedema--skin rash, itching or hives, swelling of the face, eyes, lips, tongue, arms, or legs, trouble swallowing or breathing Heart failure--shortness of breath, swelling of the ankles, feet, or hands, sudden weight gain, unusual weakness or fatigue Infusion reactions--chest pain, shortness of breath or trouble breathing, feeling faint or lightheaded Side effects that usually do not require medical attention (report to your care team if they continue or are bothersome): Diarrhea Dry skin Fatigue Hair loss Nausea Vomiting This list may not describe all possible side effects. Call your doctor for medical advice about side effects. You may report side effects to FDA at 1-800-FDA-1088. Where should I keep my medication? This medication is given in a hospital or clinic. It will not be stored at home. NOTE: This sheet is a summary. It may not cover all possible information. If you have questions about this medicine, talk to your doctor, pharmacist, or health care provider.  2024 Elsevier/Gold Standard (2021-12-22 00:00:00)    BELOW ARE SYMPTOMS THAT SHOULD BE REPORTED IMMEDIATELY: *FEVER GREATER THAN 100.4 F (38 C) OR HIGHER *CHILLS OR SWEATING *NAUSEA AND VOMITING THAT IS NOT CONTROLLED WITH YOUR NAUSEA MEDICATION *UNUSUAL SHORTNESS OF BREATH *UNUSUAL BRUISING OR BLEEDING *URINARY PROBLEMS (pain  or burning when urinating, or frequent urination) *BOWEL PROBLEMS (unusual diarrhea, constipation, pain near the anus) TENDERNESS IN MOUTH AND THROAT WITH OR WITHOUT PRESENCE OF ULCERS (sore throat, sores in mouth, or a toothache) UNUSUAL RASH, SWELLING OR PAIN  UNUSUAL VAGINAL DISCHARGE OR ITCHING   Items with * indicate a potential emergency and should be followed up as soon as possible or go to the Emergency Department if any problems should occur.  Please show the CHEMOTHERAPY ALERT CARD or IMMUNOTHERAPY ALERT CARD at check-in to the Emergency Department and triage nurse.  Should you have questions after your visit or need to cancel or reschedule your appointment, please contact [facility] [phone]  and follow the prompts.  Office hours are 8:00 a.m. to 4:30 p.m. Monday - Friday. Please note that voicemails left after 4:00 p.m. may not be returned until the following business day.  We are closed weekends and major holidays. You have access to a nurse at all times for urgent questions. Please call the main number to the clinic [phone] and follow the prompts.  For any non-urgent questions, you may also contact your provider using MyChart.  We now offer e-Visits for anyone 30 and older to request care online for non-urgent symptoms. For details visit [email or web address].   Also download the MyChart app! Go to the app store, search "MyChart", open the app, select [place], and log in with your MyChart username and password.

## 2023-01-28 ENCOUNTER — Other Ambulatory Visit: Payer: Self-pay

## 2023-01-28 LAB — CANCER ANTIGEN 27.29: CA 27.29: 32.4 U/mL (ref 0.0–38.6)

## 2023-01-29 LAB — CANCER ANTIGEN 15-3: CA 15-3: 27.7 U/mL — ABNORMAL HIGH (ref 0.0–25.0)

## 2023-02-13 ENCOUNTER — Other Ambulatory Visit: Payer: Self-pay

## 2023-02-14 ENCOUNTER — Other Ambulatory Visit: Payer: Self-pay

## 2023-02-14 DIAGNOSIS — C50912 Malignant neoplasm of unspecified site of left female breast: Secondary | ICD-10-CM

## 2023-02-17 ENCOUNTER — Inpatient Hospital Stay: Payer: Managed Care, Other (non HMO)

## 2023-02-17 ENCOUNTER — Inpatient Hospital Stay: Payer: Managed Care, Other (non HMO) | Admitting: Hematology

## 2023-02-17 VITALS — BP 110/71 | HR 63 | Temp 97.1°F | Resp 16

## 2023-02-17 DIAGNOSIS — Z95828 Presence of other vascular implants and grafts: Secondary | ICD-10-CM

## 2023-02-17 DIAGNOSIS — Z170 Estrogen receptor positive status [ER+]: Secondary | ICD-10-CM

## 2023-02-17 DIAGNOSIS — C50912 Malignant neoplasm of unspecified site of left female breast: Secondary | ICD-10-CM

## 2023-02-17 DIAGNOSIS — C50412 Malignant neoplasm of upper-outer quadrant of left female breast: Secondary | ICD-10-CM | POA: Diagnosis not present

## 2023-02-17 LAB — CBC WITH DIFFERENTIAL/PLATELET
Abs Immature Granulocytes: 0.04 10*3/uL (ref 0.00–0.07)
Basophils Absolute: 0.1 10*3/uL (ref 0.0–0.1)
Basophils Relative: 1 %
Eosinophils Absolute: 0.2 10*3/uL (ref 0.0–0.5)
Eosinophils Relative: 3 %
HCT: 34.6 % — ABNORMAL LOW (ref 36.0–46.0)
Hemoglobin: 11.1 g/dL — ABNORMAL LOW (ref 12.0–15.0)
Immature Granulocytes: 1 %
Lymphocytes Relative: 18 %
Lymphs Abs: 0.9 10*3/uL (ref 0.7–4.0)
MCH: 30.9 pg (ref 26.0–34.0)
MCHC: 32.1 g/dL (ref 30.0–36.0)
MCV: 96.4 fL (ref 80.0–100.0)
Monocytes Absolute: 0.4 10*3/uL (ref 0.1–1.0)
Monocytes Relative: 8 %
Neutro Abs: 3.5 10*3/uL (ref 1.7–7.7)
Neutrophils Relative %: 69 %
Platelets: 180 10*3/uL (ref 150–400)
RBC: 3.59 MIL/uL — ABNORMAL LOW (ref 3.87–5.11)
RDW: 16.1 % — ABNORMAL HIGH (ref 11.5–15.5)
WBC: 5 10*3/uL (ref 4.0–10.5)
nRBC: 0 % (ref 0.0–0.2)

## 2023-02-17 LAB — COMPREHENSIVE METABOLIC PANEL
ALT: 21 U/L (ref 0–44)
AST: 20 U/L (ref 15–41)
Albumin: 3.9 g/dL (ref 3.5–5.0)
Alkaline Phosphatase: 137 U/L — ABNORMAL HIGH (ref 38–126)
Anion gap: 8 (ref 5–15)
BUN: 32 mg/dL — ABNORMAL HIGH (ref 8–23)
CO2: 23 mmol/L (ref 22–32)
Calcium: 10 mg/dL (ref 8.9–10.3)
Chloride: 104 mmol/L (ref 98–111)
Creatinine, Ser: 1.53 mg/dL — ABNORMAL HIGH (ref 0.44–1.00)
GFR, Estimated: 38 mL/min — ABNORMAL LOW (ref >60)
Glucose, Bld: 128 mg/dL — ABNORMAL HIGH (ref 70–99)
Potassium: 4.1 mmol/L (ref 3.5–5.1)
Sodium: 135 mmol/L (ref 135–145)
Total Bilirubin: 0.5 mg/dL (ref 0.3–1.2)
Total Protein: 7.2 g/dL (ref 6.5–8.1)

## 2023-02-17 LAB — MAGNESIUM: Magnesium: 1.9 mg/dL (ref 1.7–2.4)

## 2023-02-17 MED ORDER — SODIUM CHLORIDE 0.9% FLUSH
10.0000 mL | INTRAVENOUS | Status: DC | PRN
  Administered 2023-02-17: 10 mL

## 2023-02-17 MED ORDER — SODIUM CHLORIDE 0.9 % IV SOLN: Freq: Once | INTRAVENOUS | Status: AC

## 2023-02-17 MED ORDER — SODIUM CHLORIDE 0.9 % IV SOLN
420.0000 mg | Freq: Once | INTRAVENOUS | Status: AC
  Administered 2023-02-17: 420 mg via INTRAVENOUS
  Filled 2023-02-17: qty 14

## 2023-02-17 MED ORDER — HEPARIN SOD (PORK) LOCK FLUSH 100 UNIT/ML IV SOLN
500.0000 [IU] | Freq: Once | INTRAVENOUS | Status: AC | PRN
  Administered 2023-02-17: 500 [IU]

## 2023-02-17 MED ORDER — SODIUM CHLORIDE 0.9% FLUSH
10.0000 mL | INTRAVENOUS | Status: DC | PRN
  Administered 2023-02-17: 10 mL via INTRAVENOUS

## 2023-02-17 MED ORDER — TRASTUZUMAB-ANNS CHEMO 420 MG IV SOLR
6.0000 mg/kg | Freq: Once | INTRAVENOUS | Status: AC
  Administered 2023-02-17: 420 mg via INTRAVENOUS
  Filled 2023-02-17: qty 20

## 2023-02-17 NOTE — Progress Notes (Signed)
Patient presents today for Kanjinti and Perjeta infusion.  Patient is in satisfactory condition with no new complaints voiced.  Vital signs are stable.  Labs reviewed and all labs are within treatment parameters.  We will proceed with treatment per MD orders.    Pt took pre-med at home prior to arrival. Patient has shingles on the right side of her face that are healing. Pt stated she will be take her last Valtrex today. Dr.K made aware and stated pt is okay to proceed with treatment today.  Treatment given today per MD orders. Tolerated infusion without adverse affects. Vital signs stable. No complaints at this time. Discharged from clinic ambulatory in stable condition. Alert and oriented x 3. F/U with [facility] as scheduled.

## 2023-02-17 NOTE — Patient Instructions (Signed)
[facility] [place]  Discharge Instructions: Thank you for choosing [facility] to provide your oncology and hematology care.  If you have a lab appointment with the Cancer Center - please note that after April 8th, 2024, all labs will be drawn in the cancer center.  You do not have to check in or register with the main entrance as you have in the past but will complete your check-in in the cancer center.  Wear comfortable clothing and clothing appropriate for easy access to any Portacath or PICC line.   We strive to give you quality time with your provider. You may need to reschedule your appointment if you arrive late (15 or more minutes).  Arriving late affects you and other patients whose appointments are after yours.  Also, if you miss three or more appointments without notifying the office, you may be dismissed from the clinic at the provider's discretion.      For prescription refill requests, have your pharmacy contact our office and allow 72 hours for refills to be completed.    Today you received the following chemotherapy and/or immunotherapy agents Kanjinti and Perjeta   To help prevent nausea and vomiting after your treatment, we encourage you to take your nausea medication as directed.  Trastuzumab Injection What is this medication? TRASTUZUMAB (tras TOO zoo mab) treats breast cancer and stomach cancer. It works by blocking a protein that causes cancer cells to grow and multiply. This helps to slow or stop the spread of cancer cells. This medicine may be used for other purposes; ask your health care provider or pharmacist if you have questions. COMMON BRAND NAME(S): Herceptin, Herzuma, KANJINTI, Ogivri, Ontruzant, Trazimera What should I tell my care team before I take this medication? They need to know if you have any of these conditions: Heart failure Lung disease An unusual or allergic reaction to trastuzumab, other medications, foods, dyes, or  preservatives Pregnant or trying to get pregnant Breast-feeding How should I use this medication? This medication is injected into a vein. It is given by your care team in a hospital or clinic setting. Talk to your care team about the use of this medication in children. It is not approved for use in children. Overdosage: If you think you have taken too much of this medicine contact a poison control center or emergency room at once. NOTE: This medicine is only for you. Do not share this medicine with others. What if I miss a dose? Keep appointments for follow-up doses. It is important not to miss your dose. Call your care team if you are unable to keep an appointment. What may interact with this medication? Certain types of chemotherapy, such as daunorubicin, doxorubicin, epirubicin, idarubicin This list may not describe all possible interactions. Give your health care provider a list of all the medicines, herbs, non-prescription drugs, or dietary supplements you use. Also tell them if you smoke, drink alcohol, or use illegal drugs. Some items may interact with your medicine. What should I watch for while using this medication? Your condition will be monitored carefully while you are receiving this medication. This medication may make you feel generally unwell. This is not uncommon, as chemotherapy affects healthy cells as well as cancer cells. Report any side effects. Continue your course of treatment even though you feel ill unless your care team tells you to stop. This medication may increase your risk of getting an infection. Call your care team for advice if you get a   fever, chills, sore throat, or other symptoms of a cold or flu. Do not treat yourself. Try to avoid being around people who are sick. Avoid taking medications that contain aspirin, acetaminophen, ibuprofen, naproxen, or ketoprofen unless instructed by your care team. These medications can hide a fever. Talk to your care team if  you may be pregnant. Serious birth defects can occur if you take this medication during pregnancy and for 7 months after the last dose. You will need a negative pregnancy test before starting this medication. Contraception is recommended while taking this medication and for 7 months after the last dose. Your care team can help you find the option that works for you. Do not breastfeed while taking this medication and for 7 months after stopping treatment. What side effects may I notice from receiving this medication? Side effects that you should report to your care team as soon as possible: Allergic reactions or angioedema--skin rash, itching or hives, swelling of the face, eyes, lips, tongue, arms, or legs, trouble swallowing or breathing Dry cough, shortness of breath or trouble breathing Heart failure--shortness of breath, swelling of the ankles, feet, or hands, sudden weight gain, unusual weakness or fatigue Infection--fever, chills, cough, or sore throat Infusion reactions--chest pain, shortness of breath or trouble breathing, feeling faint or lightheaded Side effects that usually do not require medical attention (report to your care team if they continue or are bothersome): Diarrhea Dizziness Headache Nausea Trouble sleeping Vomiting This list may not describe all possible side effects. Call your doctor for medical advice about side effects. You may report side effects to FDA at 1-800-FDA-1088. Where should I keep my medication? This medication is given in a hospital or clinic. It will not be stored at home. NOTE: This sheet is a summary. It may not cover all possible information. If you have questions about this medicine, talk to your doctor, pharmacist, or health care provider.  2024 Elsevier/Gold Standard (2021-12-22 00:00:00)  Pertuzumab Injection What is this medication? PERTUZUMAB (per TOOZ ue mab) treats breast cancer. It works by blocking a protein that causes cancer cells to  grow and multiply. This helps to slow or stop the spread of cancer cells. It is a monoclonal antibody. This medicine may be used for other purposes; ask your health care provider or pharmacist if you have questions. COMMON BRAND NAME(S): PERJETA What should I tell my care team before I take this medication? They need to know if you have any of these conditions: Heart failure An unusual or allergic reaction to pertuzumab, other medications, foods, dyes, or preservatives Pregnant or trying to get pregnant Breast-feeding How should I use this medication? This medication is injected into a vein. It is given by your care team in a hospital or clinic setting. Talk to your care team about the use of this medication in children. Special care may be needed. Overdosage: If you think you have taken too much of this medicine contact a poison control center or emergency room at once. NOTE: This medicine is only for you. Do not share this medicine with others. What if I miss a dose? Keep appointments for follow-up doses. It is important not to miss your dose. Call your care team if you are unable to keep an appointment. What may interact with this medication? Interactions are not expected. This list may not describe all possible interactions. Give your health care provider a list of all the medicines, herbs, non-prescription drugs, or dietary supplements you use. Also tell them if   you smoke, drink alcohol, or use illegal drugs. Some items may interact with your medicine. What should I watch for while using this medication? Your condition will be monitored carefully while you are receiving this medication. This medication may make you feel generally unwell. This is not uncommon as chemotherapy can affect healthy cells as well as cancer cells. Report any side effects. Continue your course of treatment even though you feel ill unless your care team tells you to stop. Talk to your care team if you may be  pregnant. Serious birth defects can occur if you take this medication during pregnancy and for 7 months after the last dose. You will need a negative pregnancy test before starting this medication. Contraception is recommended while taking this medication and for 7 months after the last dose. Your care team can help you find the option that works for you. Do not breastfeed while taking this medication and for 7 months after the last dose. What side effects may I notice from receiving this medication? Side effects that you should report to your care team as soon as possible: Allergic reactions or angioedema--skin rash, itching or hives, swelling of the face, eyes, lips, tongue, arms, or legs, trouble swallowing or breathing Heart failure--shortness of breath, swelling of the ankles, feet, or hands, sudden weight gain, unusual weakness or fatigue Infusion reactions--chest pain, shortness of breath or trouble breathing, feeling faint or lightheaded Side effects that usually do not require medical attention (report to your care team if they continue or are bothersome): Diarrhea Dry skin Fatigue Hair loss Nausea Vomiting This list may not describe all possible side effects. Call your doctor for medical advice about side effects. You may report side effects to FDA at 1-800-FDA-1088. Where should I keep my medication? This medication is given in a hospital or clinic. It will not be stored at home. NOTE: This sheet is a summary. It may not cover all possible information. If you have questions about this medicine, talk to your doctor, pharmacist, or health care provider.  2024 Elsevier/Gold Standard (2021-12-22 00:00:00)    BELOW ARE SYMPTOMS THAT SHOULD BE REPORTED IMMEDIATELY: *FEVER GREATER THAN 100.4 F (38 C) OR HIGHER *CHILLS OR SWEATING *NAUSEA AND VOMITING THAT IS NOT CONTROLLED WITH YOUR NAUSEA MEDICATION *UNUSUAL SHORTNESS OF BREATH *UNUSUAL BRUISING OR BLEEDING *URINARY PROBLEMS (pain  or burning when urinating, or frequent urination) *BOWEL PROBLEMS (unusual diarrhea, constipation, pain near the anus) TENDERNESS IN MOUTH AND THROAT WITH OR WITHOUT PRESENCE OF ULCERS (sore throat, sores in mouth, or a toothache) UNUSUAL RASH, SWELLING OR PAIN  UNUSUAL VAGINAL DISCHARGE OR ITCHING   Items with * indicate a potential emergency and should be followed up as soon as possible or go to the Emergency Department if any problems should occur.  Please show the CHEMOTHERAPY ALERT CARD or IMMUNOTHERAPY ALERT CARD at check-in to the Emergency Department and triage nurse.  Should you have questions after your visit or need to cancel or reschedule your appointment, please contact [facility] [place] [phone]  and follow the prompts.  Office hours are 8:00 a.m. to 4:30 p.m. Monday - Friday. Please note that voicemails left after 4:00 p.m. may not be returned until the following business day.  We are closed weekends and major holidays. You have access to a nurse at all times for urgent questions. Please call the main number to the clinic [phone] and follow the prompts.  For any non-urgent questions, you may also contact your provider using MyChart.   We now offer e-Visits for anyone 18 and older to request care online for non-urgent symptoms. For details visit [email or web address].   Also download the MyChart app! Go to the app store, search "MyChart", open the app, select [place], and log in with your MyChart username and password.   

## 2023-03-03 ENCOUNTER — Encounter: Payer: Self-pay | Admitting: Hematology

## 2023-03-03 ENCOUNTER — Encounter (HOSPITAL_COMMUNITY): Payer: Self-pay | Admitting: Hematology

## 2023-03-09 ENCOUNTER — Encounter (HOSPITAL_COMMUNITY): Payer: Self-pay | Admitting: Hematology

## 2023-03-09 ENCOUNTER — Other Ambulatory Visit: Payer: Self-pay

## 2023-03-09 ENCOUNTER — Encounter: Payer: Self-pay | Admitting: Hematology

## 2023-03-09 DIAGNOSIS — Z170 Estrogen receptor positive status [ER+]: Secondary | ICD-10-CM

## 2023-03-10 ENCOUNTER — Ambulatory Visit: Payer: Managed Care, Other (non HMO) | Admitting: Hematology

## 2023-03-10 ENCOUNTER — Inpatient Hospital Stay: Payer: Managed Care, Other (non HMO)

## 2023-03-10 ENCOUNTER — Inpatient Hospital Stay: Payer: Managed Care, Other (non HMO) | Attending: Oncology

## 2023-03-10 VITALS — BP 134/74 | HR 66 | Temp 97.6°F | Resp 18

## 2023-03-10 DIAGNOSIS — C50212 Malignant neoplasm of upper-inner quadrant of left female breast: Secondary | ICD-10-CM | POA: Insufficient documentation

## 2023-03-10 DIAGNOSIS — Z170 Estrogen receptor positive status [ER+]: Secondary | ICD-10-CM | POA: Diagnosis not present

## 2023-03-10 DIAGNOSIS — Z79811 Long term (current) use of aromatase inhibitors: Secondary | ICD-10-CM | POA: Insufficient documentation

## 2023-03-10 DIAGNOSIS — Z5112 Encounter for antineoplastic immunotherapy: Secondary | ICD-10-CM | POA: Insufficient documentation

## 2023-03-10 DIAGNOSIS — Z9012 Acquired absence of left breast and nipple: Secondary | ICD-10-CM | POA: Diagnosis not present

## 2023-03-10 DIAGNOSIS — Z79899 Other long term (current) drug therapy: Secondary | ICD-10-CM | POA: Diagnosis not present

## 2023-03-10 DIAGNOSIS — F17210 Nicotine dependence, cigarettes, uncomplicated: Secondary | ICD-10-CM | POA: Diagnosis not present

## 2023-03-10 DIAGNOSIS — Z95828 Presence of other vascular implants and grafts: Secondary | ICD-10-CM

## 2023-03-10 DIAGNOSIS — C50412 Malignant neoplasm of upper-outer quadrant of left female breast: Secondary | ICD-10-CM | POA: Insufficient documentation

## 2023-03-10 LAB — CBC WITH DIFFERENTIAL/PLATELET
Abs Immature Granulocytes: 0.02 10*3/uL (ref 0.00–0.07)
Basophils Absolute: 0 10*3/uL (ref 0.0–0.1)
Basophils Relative: 1 %
Eosinophils Absolute: 0.2 10*3/uL (ref 0.0–0.5)
Eosinophils Relative: 4 %
HCT: 34.6 % — ABNORMAL LOW (ref 36.0–46.0)
Hemoglobin: 11 g/dL — ABNORMAL LOW (ref 12.0–15.0)
Immature Granulocytes: 0 %
Lymphocytes Relative: 15 %
Lymphs Abs: 0.8 10*3/uL (ref 0.7–4.0)
MCH: 31.1 pg (ref 26.0–34.0)
MCHC: 31.8 g/dL (ref 30.0–36.0)
MCV: 97.7 fL (ref 80.0–100.0)
Monocytes Absolute: 0.5 10*3/uL (ref 0.1–1.0)
Monocytes Relative: 9 %
Neutro Abs: 3.9 10*3/uL (ref 1.7–7.7)
Neutrophils Relative %: 71 %
Platelets: 180 10*3/uL (ref 150–400)
RBC: 3.54 MIL/uL — ABNORMAL LOW (ref 3.87–5.11)
RDW: 17 % — ABNORMAL HIGH (ref 11.5–15.5)
WBC: 5.4 10*3/uL (ref 4.0–10.5)
nRBC: 0 % (ref 0.0–0.2)

## 2023-03-10 LAB — COMPREHENSIVE METABOLIC PANEL
ALT: 13 U/L (ref 0–44)
AST: 12 U/L — ABNORMAL LOW (ref 15–41)
Albumin: 3.5 g/dL (ref 3.5–5.0)
Alkaline Phosphatase: 106 U/L (ref 38–126)
Anion gap: 5 (ref 5–15)
BUN: 30 mg/dL — ABNORMAL HIGH (ref 8–23)
CO2: 23 mmol/L (ref 22–32)
Calcium: 9.6 mg/dL (ref 8.9–10.3)
Chloride: 106 mmol/L (ref 98–111)
Creatinine, Ser: 1.51 mg/dL — ABNORMAL HIGH (ref 0.44–1.00)
GFR, Estimated: 38 mL/min — ABNORMAL LOW (ref >60)
Glucose, Bld: 100 mg/dL — ABNORMAL HIGH (ref 70–99)
Potassium: 4.3 mmol/L (ref 3.5–5.1)
Sodium: 134 mmol/L — ABNORMAL LOW (ref 135–145)
Total Bilirubin: 0.5 mg/dL (ref 0.3–1.2)
Total Protein: 6.5 g/dL (ref 6.5–8.1)

## 2023-03-10 LAB — MAGNESIUM: Magnesium: 1.8 mg/dL (ref 1.7–2.4)

## 2023-03-10 MED ORDER — SODIUM CHLORIDE 0.9% FLUSH
10.0000 mL | INTRAVENOUS | Status: DC | PRN
  Administered 2023-03-10: 10 mL

## 2023-03-10 MED ORDER — SODIUM CHLORIDE 0.9 % IV SOLN
420.0000 mg | Freq: Once | INTRAVENOUS | Status: AC
  Administered 2023-03-10: 420 mg via INTRAVENOUS
  Filled 2023-03-10: qty 14

## 2023-03-10 MED ORDER — TRASTUZUMAB-ANNS CHEMO 150 MG IV SOLR
6.0000 mg/kg | Freq: Once | INTRAVENOUS | Status: AC
  Administered 2023-03-10: 420 mg via INTRAVENOUS
  Filled 2023-03-10: qty 20

## 2023-03-10 MED ORDER — HEPARIN SOD (PORK) LOCK FLUSH 100 UNIT/ML IV SOLN
500.0000 [IU] | Freq: Once | INTRAVENOUS | Status: AC | PRN
  Administered 2023-03-10: 500 [IU]

## 2023-03-10 MED ORDER — ACETAMINOPHEN 325 MG PO TABS: 650.0000 mg | ORAL_TABLET | Freq: Once | ORAL | Status: DC

## 2023-03-10 MED ORDER — SODIUM CHLORIDE 0.9 % IV SOLN: Freq: Once | INTRAVENOUS | Status: AC

## 2023-03-10 MED ORDER — SODIUM CHLORIDE 0.9% FLUSH
10.0000 mL | INTRAVENOUS | Status: DC | PRN
  Administered 2023-03-10: 10 mL via INTRAVENOUS

## 2023-03-10 MED ORDER — CETIRIZINE HCL 10 MG PO TABS: 10.0000 mg | ORAL_TABLET | Freq: Once | ORAL | Status: DC

## 2023-03-10 NOTE — Patient Instructions (Signed)
[facility]  Discharge Instructions: Thank you for choosing [facility] to provide your oncology and hematology care.  If you have a lab appointment with the Cancer Center - please note that after April 8th, 2024, all labs will be drawn in the cancer center.  You do not have to check in or register with the main entrance as you have in the past but will complete your check-in in the cancer center.  Wear comfortable clothing and clothing appropriate for easy access to any Portacath or PICC line.   We strive to give you quality time with your provider. You may need to reschedule your appointment if you arrive late (15 or more minutes).  Arriving late affects you and other patients whose appointments are after yours.  Also, if you miss three or more appointments without notifying the office, you may be dismissed from the clinic at the provider's discretion.      For prescription refill requests, have your pharmacy contact our office and allow 72 hours for refills to be completed.    Today you received the following chemotherapy and/or immunotherapy agents Kanjinti, and Prejeta.  Pertuzumab Injection What is this medication? PERTUZUMAB (per TOOZ ue mab) treats breast cancer. It works by blocking a protein that causes cancer cells to grow and multiply. This helps to slow or stop the spread of cancer cells. It is a monoclonal antibody. This medicine may be used for other purposes; ask your health care provider or pharmacist if you have questions. COMMON BRAND NAME(S): PERJETA What should I tell my care team before I take this medication? They need to know if you have any of these conditions: Heart failure An unusual or allergic reaction to pertuzumab, other medications, foods, dyes, or preservatives Pregnant or trying to get pregnant Breast-feeding How should I use this medication? This medication is injected into a vein. It is given by your care team in a  hospital or clinic setting. Talk to your care team about the use of this medication in children. Special care may be needed. Overdosage: If you think you have taken too much of this medicine contact a poison control center or emergency room at once. NOTE: This medicine is only for you. Do not share this medicine with others. What if I miss a dose? Keep appointments for follow-up doses. It is important not to miss your dose. Call your care team if you are unable to keep an appointment. What may interact with this medication? Interactions are not expected. This list may not describe all possible interactions. Give your health care provider a list of all the medicines, herbs, non-prescription drugs, or dietary supplements you use. Also tell them if you smoke, drink alcohol, or use illegal drugs. Some items may interact with your medicine. What should I watch for while using this medication? Your condition will be monitored carefully while you are receiving this medication. This medication may make you feel generally unwell. This is not uncommon as chemotherapy can affect healthy cells as well as cancer cells. Report any side effects. Continue your course of treatment even though you feel ill unless your care team tells you to stop. Talk to your care team if you may be pregnant. Serious birth defects can occur if you take this medication during pregnancy and for 7 months after the last dose. You will need a negative pregnancy test before starting this medication. Contraception is recommended while taking this medication and for 7 months after the  last dose. Your care team can help you find the option that works for you. Do not breastfeed while taking this medication and for 7 months after the last dose. What side effects may I notice from receiving this medication? Side effects that you should report to your care team as soon as possible: Allergic reactions or angioedema--skin rash, itching or hives,  swelling of the face, eyes, lips, tongue, arms, or legs, trouble swallowing or breathing Heart failure--shortness of breath, swelling of the ankles, feet, or hands, sudden weight gain, unusual weakness or fatigue Infusion reactions--chest pain, shortness of breath or trouble breathing, feeling faint or lightheaded Side effects that usually do not require medical attention (report to your care team if they continue or are bothersome): Diarrhea Dry skin Fatigue Hair loss Nausea Vomiting This list may not describe all possible side effects. Call your doctor for medical advice about side effects. You may report side effects to FDA at 1-800-FDA-1088. Where should I keep my medication? This medication is given in a hospital or clinic. It will not be stored at home. NOTE: This sheet is a summary. It may not cover all possible information. If you have questions about this medicine, talk to your doctor, pharmacist, or health care provider.  2024 Elsevier/Gold Standard (2021-12-22 00:00:00)   Trastuzumab Injection What is this medication? TRASTUZUMAB (tras TOO zoo mab) treats breast cancer and stomach cancer. It works by blocking a protein that causes cancer cells to grow and multiply. This helps to slow or stop the spread of cancer cells. This medicine may be used for other purposes; ask your health care provider or pharmacist if you have questions. COMMON BRAND NAME(S): Herceptin, Marlowe Alt, Ontruzant, Trazimera What should I tell my care team before I take this medication? They need to know if you have any of these conditions: Heart failure Lung disease An unusual or allergic reaction to trastuzumab, other medications, foods, dyes, or preservatives Pregnant or trying to get pregnant Breast-feeding How should I use this medication? This medication is injected into a vein. It is given by your care team in a hospital or clinic setting. Talk to your care team about the use of  this medication in children. It is not approved for use in children. Overdosage: If you think you have taken too much of this medicine contact a poison control center or emergency room at once. NOTE: This medicine is only for you. Do not share this medicine with others. What if I miss a dose? Keep appointments for follow-up doses. It is important not to miss your dose. Call your care team if you are unable to keep an appointment. What may interact with this medication? Certain types of chemotherapy, such as daunorubicin, doxorubicin, epirubicin, idarubicin This list may not describe all possible interactions. Give your health care provider a list of all the medicines, herbs, non-prescription drugs, or dietary supplements you use. Also tell them if you smoke, drink alcohol, or use illegal drugs. Some items may interact with your medicine. What should I watch for while using this medication? Your condition will be monitored carefully while you are receiving this medication. This medication may make you feel generally unwell. This is not uncommon, as chemotherapy affects healthy cells as well as cancer cells. Report any side effects. Continue your course of treatment even though you feel ill unless your care team tells you to stop. This medication may increase your risk of getting an infection. Call your care team for advice if you get  a fever, chills, sore throat, or other symptoms of a cold or flu. Do not treat yourself. Try to avoid being around people who are sick. Avoid taking medications that contain aspirin, acetaminophen, ibuprofen, naproxen, or ketoprofen unless instructed by your care team. These medications can hide a fever. Talk to your care team if you may be pregnant. Serious birth defects can occur if you take this medication during pregnancy and for 7 months after the last dose. You will need a negative pregnancy test before starting this medication. Contraception is recommended while  taking this medication and for 7 months after the last dose. Your care team can help you find the option that works for you. Do not breastfeed while taking this medication and for 7 months after stopping treatment. What side effects may I notice from receiving this medication? Side effects that you should report to your care team as soon as possible: Allergic reactions or angioedema--skin rash, itching or hives, swelling of the face, eyes, lips, tongue, arms, or legs, trouble swallowing or breathing Dry cough, shortness of breath or trouble breathing Heart failure--shortness of breath, swelling of the ankles, feet, or hands, sudden weight gain, unusual weakness or fatigue Infection--fever, chills, cough, or sore throat Infusion reactions--chest pain, shortness of breath or trouble breathing, feeling faint or lightheaded Side effects that usually do not require medical attention (report to your care team if they continue or are bothersome): Diarrhea Dizziness Headache Nausea Trouble sleeping Vomiting This list may not describe all possible side effects. Call your doctor for medical advice about side effects. You may report side effects to FDA at 1-800-FDA-1088. Where should I keep my medication? This medication is given in a hospital or clinic. It will not be stored at home. NOTE: This sheet is a summary. It may not cover all possible information. If you have questions about this medicine, talk to your doctor, pharmacist, or health care provider.  2024 Elsevier/Gold Standard (2021-12-22 00:00:00)       To help prevent nausea and vomiting after your treatment, we encourage you to take your nausea medication as directed.  BELOW ARE SYMPTOMS THAT SHOULD BE REPORTED IMMEDIATELY: *FEVER GREATER THAN 100.4 F (38 C) OR HIGHER *CHILLS OR SWEATING *NAUSEA AND VOMITING THAT IS NOT CONTROLLED WITH YOUR NAUSEA MEDICATION *UNUSUAL SHORTNESS OF BREATH *UNUSUAL BRUISING OR BLEEDING *URINARY PROBLEMS  (pain or burning when urinating, or frequent urination) *BOWEL PROBLEMS (unusual diarrhea, constipation, pain near the anus) TENDERNESS IN MOUTH AND THROAT WITH OR WITHOUT PRESENCE OF ULCERS (sore throat, sores in mouth, or a toothache) UNUSUAL RASH, SWELLING OR PAIN  UNUSUAL VAGINAL DISCHARGE OR ITCHING   Items with * indicate a potential emergency and should be followed up as soon as possible or go to the Emergency Department if any problems should occur.  Please show the CHEMOTHERAPY ALERT CARD or IMMUNOTHERAPY ALERT CARD at check-in to the Emergency Department and triage nurse.  Should you have questions after your visit or need to cancel or reschedule your appointment, please contact [facility] [phone]  and follow the prompts.  Office hours are 8:00 a.m. to 4:30 p.m. Monday - Friday. Please note that voicemails left after 4:00 p.m. may not be returned until the following business day.  We are closed weekends and major holidays. You have access to a nurse at all times for urgent questions. Please call the main number to the clinic [phone] and follow the prompts.  For any non-urgent questions, you may also contact your  provider using MyChart. We now offer e-Visits for anyone 61 and older to request care online for non-urgent symptoms. For details visit [email or web address].   Also download the MyChart app! Go to the app store, search "MyChart", open the app, select [facility], and log in with your MyChart username and password.

## 2023-03-10 NOTE — Progress Notes (Signed)
Patients port flushed without difficulty.  Good blood return noted with no bruising or swelling noted at site.  Patient remains accessed for treatment.  

## 2023-03-10 NOTE — Progress Notes (Signed)
Patient presents today for chemotherapy infusion of Kanjinti and Prejeta. Patient is in satisfactory condition with no new complaints voiced.  Vital signs are stable. Patient reports taking premeds of Tylenol and Zyrtec at 08:30am prior to arrival.  Labs reviewed, creatinine improved 1.51. Patient VS within treatment parameter.  We will proceed with treatment per MD orders.    Patient tolerated treatment well with no complaints voiced.  Patient left ambulatory in stable condition.  Vital signs stable at discharge.  Follow up as scheduled.

## 2023-03-16 ENCOUNTER — Other Ambulatory Visit: Payer: Self-pay | Admitting: Physician Assistant

## 2023-03-16 DIAGNOSIS — F419 Anxiety disorder, unspecified: Secondary | ICD-10-CM

## 2023-03-24 ENCOUNTER — Encounter (HOSPITAL_COMMUNITY): Payer: Self-pay | Admitting: Hematology

## 2023-03-24 ENCOUNTER — Encounter: Payer: Self-pay | Admitting: Hematology

## 2023-03-28 ENCOUNTER — Encounter (HOSPITAL_COMMUNITY): Payer: Self-pay | Admitting: Hematology

## 2023-03-28 ENCOUNTER — Encounter: Payer: Self-pay | Admitting: Hematology

## 2023-03-30 ENCOUNTER — Other Ambulatory Visit: Payer: Self-pay

## 2023-03-30 NOTE — Progress Notes (Incomplete)
[facility] [address], [place] [address]    Clinic Day:  03/31/2023  Referring physician: [name], MD  Patient Care Team: [name], MD as PCP - General (Internal Medicine) [name], MD as PCP - Cardiology (Cardiology) [name], MD as Medical Oncologist (Medical Oncology) [name], RN as Oncology Nurse Navigator (Medical Oncology)   ASSESSMENT & PLAN:   Assessment: 1. 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 - She noticed lump has gotten bigger and deformed since September 2022.  She has itching over the skin of the breast. - Left breast 4:00 biopsy: Invasive mammary carcinoma, grade 2, ER 100%, PR 70%, Ki-67 25%, HER2 FISH positive - Left breast 2:00 biopsy: Invasive ductal carcinoma, grade 2, ER 100%, PR 70%, Ki-67 25%, HER2 negative - Left axillary lymph node biopsy: IDC, ER 100%, PR 50%, HER2 negative, E-cadherin positive - Bilateral breast MRI on 10/09/2021: Multiple enhancing masses with intervening non-mass enhancement involving most of the LOQ and part of the UOQ.  Abnormal enhancement spans approximately 12.4 x 6.4 x 7 cm.  Diffuse skin thickening/edema.  Multiple left axillary lymph nodes, largest node measuring approximately 2.4 cm in the lower axilla.  Indeterminate non-mass enhancement inferior to the biopsy site in the subareolar right breast measuring 1.5 x 0.9 x 0.8 cm. - Right breast biopsy UOQ on 10/01/2021: Fibroadenomatoid nodule with calcifications, fibrocystic changes including apocrine metaplasia with no malignancy. -PET scan from 10/22/2021: Large left breast mass and numerous hypermetabolic left axillary and subpectoral lymph nodes.  2 hypermetabolic left upper lobe lung nodules measuring 7 mm with SUV 4.2 and 3.2 respectively.  No other hypermetabolic lung nodules.  No distant metastatic disease.  Nonspecific low-level subareolar activity in the right  breast near the surgical clips and a single mildly hypermetabolic right axillary lymph node. - Echocardiogram shows EF 60 to 65%. - She has oligometastatic disease with 2 small lung nodules.  Biopsy is not feasible.  I have recommended TCHP regimen followed by restaging after 3 cycles. -6 cycles of dose reduced TCHP from 11/04/2021 through 03/11/2022 - Maintenance Herceptin and pertuzumab started on 04/01/2022, anastrozole started on 04/01/2022 -Left mastectomy and lymph node dissection (05/05/2022): [id number], residual IDC, grade 2, discontinuously involving fibrotic area measuring 5.9 cm, margins negative, LVI positive, response to therapy seen including hyalin fibrosis and dystrophic calcifications.  9/18 lymph nodes positive, largest focus of metastatic carcinoma measures 1.2 cm with extranodal extension.  Right breast lumpectomy shows atypical lobular hyperplasia. - We discussed switching to Kadcyla in the adjuvant setting.  Related to efficacy of Kadcyla compared with pertuzumab and trastuzumab in the adjuvant setting is unknown.  Patient is reluctant to consider switching as she had poor tolerance with the prior chemotherapy.  Hence we have continued Herceptin and Perjeta. - XRT to the chest wall from 09/13/2022   2. Social/family history: - She lives at home with her husband and daughter.  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  Current active smoker, half pack per day since age 28. - Mother had cervical cancer and bladder cancer.  Maternal grandmother had ovarian cancer.  Brother died at age 2 from acute leukemia.  Another brother died of liver cancer.  Maternal half aunt had breast cancer.    Plan: 1. Oligometastatic (two 7 mm lung lesions) HER2 positive left breast cancer: - She is tolerating anastrozole very well. - CT CAP  from [facility] on 12/14/2022: Stable lung nodules. - Reviewed labs today: Normal LFTs.  CBC grossly normal.  CA 15-3 was 36.8. - Proceed with  Herceptin and pertuzumab today and every 3 weeks.  RTC 12 weeks with repeat CT CAP with contrast and tumor marker. - She still has dysuria.  Urine culture showed E. coli which was resistant to Cipro.  We will send Bactrim twice daily for 7 days.   2.  Right facial shingles/postherpetic right hemifacial pain: - Continue gabapentin 300 mg 3 times daily.  3.  Hypomagnesemia: - Continue magnesium 2 tablets twice daily.  Magnesium is normal today.  4.  High risk drug monitoring: - Last 2D echo in 09/2022 with EF 55 to 60%.  Will repeat prior to next visit.  5.  Hypertension: - Continue amlodipine daily.  Blood pressure is well-controlled.  6.  Left leg DVT (diagnosed 03/04/2022): - Continue Eliquis twice daily.  No bleeding issues.  7.  Osteopenia (DEXA 04/19/2022 T score -2.1): - Continue calcium and vitamin D supplements.    No orders of the defined types were placed in this encounter.      Alben Deeds Fuller,acting as a Neurosurgeon for [name], MD.,have documented all relevant documentation on the behalf of [name], MD,as directed by  [name], MD while in the presence of [name], MD.  ***   Traci Fuller   8/7/20241:11 PM  CHIEF COMPLAINT:   Diagnosis: locally advanced left breast cancer    Cancer Staging  Breast cancer, left [facility]) Staging form: Breast, AJCC 8th Edition - Clinical stage from 10/15/2021: Stage IIA (cT3, cN1, cM0, G2, ER+, PR+, HER2+) - Unsigned    Prior Therapy: TCHP x 6 cycles   Current Therapy:  Anastrozole daily, trastuzumab and Herceptin every 21 days    HISTORY OF PRESENT ILLNESS:   Oncology History  Breast cancer, left (HCC)  10/15/2021 Initial Diagnosis   Breast cancer, left (HCC)   11/04/2021 - 04/22/2022 Chemotherapy   Patient is on Treatment Plan : BREAST  Docetaxel + Carboplatin + Trastuzumab + Pertuzumab  (TCHP) q21d       Genetic Testing   Negative genetic testing. No pathogenic variants  identified on the [facility] Multi-Cancer+RNA panel. The report date is 11/02/2021.   The Multi-Cancer Panel + RNA offered by [facility] includes sequencing and/or deletion duplication testing of the following 84 genes: AIP, ALK, APC, ATM, AXIN2,BAP1,  BARD1, BLM, BMPR1A, BRCA1, BRCA2, BRIP1, CASR, CDC73, CDH1, CDK4, CDKN1B, CDKN1C, CDKN2A (p14ARF), CDKN2A (p16INK4a), CEBPA, CHEK2, CTNNA1, DICER1, DIS3L2, EGFR (c.2369C>T, p.Thr790Met variant only), EPCAM (Deletion/duplication testing only), FH, FLCN, GATA2, GPC3, GREM1 (Promoter region deletion/duplication testing only), HOXB13 (c.251G>A, p.Gly84Glu), HRAS, KIT, MAX, MEN1, MET, MITF (c.952G>A, p.Glu318Lys variant only), MLH1, MSH2, MSH3, MSH6, MUTYH, NBN, NF1, NF2, NTHL1, PALB2, PDGFRA, PHOX2B, PMS2, POLD1, POLE, POT1, PRKAR1A, PTCH1, PTEN, RAD50, RAD51C, RAD51D, RB1, RECQL4, RET, RUNX1, SDHAF2, SDHA (sequence changes only), SDHB, SDHC, SDHD, SMAD4, SMARCA4, SMARCB1, SMARCE1, STK11, SUFU, TERC, TERT, TMEM127, TP53, TSC1, TSC2, VHL, WRN and WT1.   11/04/2021 -  Chemotherapy   Patient is on Treatment Plan : BREAST  Docetaxel + Carboplatin + Trastuzumab + Pertuzumab  (TCHP) q21d / Trastuzumab + Pertuzumab q21d        INTERVAL HISTORY:   Traci Fuller is a 67 y.o. female presenting to clinic today for follow up of locally advanced left breast cancer. She was last seen by me on 01/27/23.  Today, she states that she is doing well overall. Her appetite  level is at ***%. Her energy level is at ***%.  PAST MEDICAL HISTORY:   Past Medical History: 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 DES to midLAD, 75% stenosis of RCA not treated   Cancer [facility])    Current smoker    Essential hypertension    Family history of bladder cancer    Family history of breast cancer    Family history of leukemia    Family history of uterine cancer    Hyperlipidemia LDL goal <70    Myocardial infarction (HCC)    PONV (postoperative  nausea and vomiting)    Port-A-Cath in place 10/29/2021    Surgical History: Past Surgical History:  Procedure Laterality Date   ABDOMINAL HYSTERECTOMY     BREAST BIOPSY Bilateral 10/16/2021   BREAST BIOPSY WITH RADIO FREQUENCY LOCALIZER Right 05/05/2022   Procedure: BREAST BIOPSY WITH RADIO FREQUENCY LOCALIZER;  Surgeon: Lucretia Roers, MD;  Location: [facility];  Service: General;  Laterality: Right;   CORONARY STENT INTERVENTION N/A 01/10/2018   Procedure: CORONARY STENT INTERVENTION;  Surgeon: Corky Crafts, MD;  Location: [facility] INVASIVE CV LAB;  Service: Cardiovascular;  Laterality: N/A;   LEFT HEART CATH AND CORONARY ANGIOGRAPHY N/A 01/10/2018   Procedure: LEFT HEART CATH AND CORONARY ANGIOGRAPHY;  Surgeon: Corky Crafts, MD;  Location: [facility] INVASIVE CV LAB;  Service: Cardiovascular;  Laterality: N/A;   MASTECTOMY MODIFIED RADICAL Left 05/05/2022   Procedure: MASTECTOMY MODIFIED RADICAL WITH RADIO FREQUENCY LOCALIZER;  Surgeon: Lucretia Roers, MD;  Location: [facility];  Service: General;  Laterality: Left;   PORTACATH PLACEMENT Right 10/26/2021   Procedure: INSERTION PORT-A-CATH;  Surgeon: Lucretia Roers, MD;  Location: [facility];  Service: General;  Laterality: Right;    Social History: Social History   Socioeconomic History   Marital status: Married    Spouse name: Not on file   Number of children: Not on file   Years of education: Not on file   Highest education level: Not on file  Occupational History   Not on file  Tobacco Use   Smoking status: Every Day    Current packs/day: 1.00    Average packs/day: 1 pack/day for 48.6 years (48.6 ttl pk-yrs)    Types: Cigarettes    Start date: 1976   Smokeless tobacco: Never  Vaping Use   Vaping status: Never Used  Substance and Sexual Activity   Alcohol use: Not Currently   Drug use: Never   Sexual activity: Yes  Other Topics Concern   Not on file  Social History Narrative   Not on file   Social Determinants of  Health   Financial Resource Strain: Not on file  Food Insecurity: No Food Insecurity (05/05/2022)   Hunger Vital Sign    Worried About Running Out of Food in the Last Year: Never true    Ran Out of Food in the Last Year: Never true  Transportation Needs: No Transportation Needs (05/05/2022)   PRAPARE - Administrator, Civil Service (Medical): No    Lack of Transportation (Non-Medical): No  Physical Activity: Not on file  Stress: Not on file  Social Connections: Unknown (12/06/2022)   Received from [facility], [facility]   Social Network    Social Network: Not on file  Intimate Partner Violence: Unknown (12/06/2022)   Received from [facility], [facility]   HITS    Physically Hurt: Not on file  Insult or Talk Down To: Not on file    Threaten Physical Harm: Not on file    Scream or Curse: Not on file    Family History: Family History  Problem Relation Age of Onset   Bladder Cancer Mother 21   Cervical cancer Mother        dx 65s, hysterectomy   Hypertension Father    Cirrhosis Father    Leukemia Brother 1   Liver cancer Brother 45   Breast cancer Maternal Aunt        dx 6s   Cancer Maternal Uncle        unk type d. 39   Cancer Paternal Aunt        unk type   Breast cancer Maternal Grandmother        dx 36s   Uterine cancer Maternal Grandmother     Current Medications:  Current Outpatient Medications:    ALPRAZolam (XANAX) 0.25 MG tablet, TAKE 1 TABLET(0.25 MG) BY MOUTH TWICE DAILY AS NEEDED FOR ANXIETY, Disp: 60 tablet, Rfl: 0   amLODipine (NORVASC) 10 MG tablet, Take 10 mg by mouth daily., Disp: , Rfl:    anastrozole (ARIMIDEX) 1 MG tablet, Take 1 tablet (1 mg total) by mouth daily., Disp: 90 tablet, Rfl: 4   apixaban (ELIQUIS) 5 MG TABS tablet, TAKE 1 TABLET(5 MG) BY MOUTH TWICE DAILY, Disp: 60 tablet, Rfl: 5   aspirin EC 81 MG tablet, Take 1 tablet (81 mg total) by mouth daily., Disp: 90 tablet, Rfl: 3   Calcium Carb-Cholecalciferol  (CALCIUM 600/VITAMIN D3 PO), Take 1 tablet by mouth in the morning and at bedtime., Disp: , Rfl:    CARBOPLATIN IV, Inject into the vein every 21 ( twenty-one) days., Disp: , Rfl:    DOCEtaxel (TAXOTERE IV), Inject into the vein every 21 ( twenty-one) days., Disp: , Rfl:    Evolocumab (REPATHA SURECLICK) 140 MG/ML SOAJ, Inject 140 mg into the skin every 14 (fourteen) days., Disp: 2 mL, Rfl: 11   gabapentin (NEURONTIN) 300 MG capsule, Take 300 mg by mouth 2 (two) times daily., Disp: , Rfl:    magnesium oxide (MAG-OX) 400 (240 Mg) MG tablet, Take 2 tablets (800 mg total) by mouth 2 (two) times daily., Disp: 120 tablet, Rfl: 4   metoprolol tartrate (LOPRESSOR) 25 MG tablet, Take 1 tablet (25 mg total) by mouth 2 (two) times daily. Please call and schedule overdue appointment with Dr. Allyson Sabal for further refills . SECOND ATTEMPT!, Disp: 30 tablet, Rfl: 0   mometasone (ELOCON) 0.1 % cream, Apply topically 2 (two) times daily., Disp: , Rfl:    omeprazole (PRILOSEC OTC) 20 MG tablet, Take 20 mg by mouth daily., Disp: , Rfl:    ondansetron (ZOFRAN-ODT) 4 MG disintegrating tablet, Take 1 tablet (4 mg total) by mouth every 6 (six) hours as needed for nausea., Disp: 20 tablet, Rfl: 0   oxyCODONE (OXY IR/ROXICODONE) 5 MG immediate release tablet, Take 1-2 tablets (5-10 mg total) by mouth every 4 (four) hours as needed for severe pain or breakthrough pain., Disp: 30 tablet, Rfl: 0   pegfilgrastim-bmez (ZIEXTENZO) 6 MG/0.6ML injection, Inject 6 mg into the skin every 21 ( twenty-one) days. AFTER chemotherapy., Disp: , Rfl:    Pertuzumab (PERJETA IV), Inject into the vein every 21 ( twenty-one) days., Disp: , Rfl:    sulfamethoxazole-trimethoprim (BACTRIM DS) 800-160 MG tablet, Take 1 tablet by mouth 2 (two) times daily., Disp: 14 tablet, Rfl: 0   trastuzumab-anns (KANJINTI) 150 MG  SOLR injection, Inject 462 mg into the vein every 21 ( twenty-one) days., Disp: , Rfl:    Allergies: No Known Allergies  REVIEW OF  SYSTEMS:   Review of Systems  Constitutional:  Negative for chills, fatigue and fever.  HENT:   Negative for lump/mass, mouth sores, nosebleeds, sore throat and trouble swallowing.   Eyes:  Negative for eye problems.  Respiratory:  Negative for cough and shortness of breath.   Cardiovascular:  Negative for chest pain, leg swelling and palpitations.  Gastrointestinal:  Negative for abdominal pain, constipation, diarrhea, nausea and vomiting.  Genitourinary:  Negative for bladder incontinence, difficulty urinating, dysuria, frequency, hematuria and nocturia.   Musculoskeletal:  Negative for arthralgias, back pain, flank pain, myalgias and neck pain.  Skin:  Negative for itching and rash.  Neurological:  Negative for dizziness, headaches and numbness.  Hematological:  Does not bruise/bleed easily.  Psychiatric/Behavioral:  Negative for depression, sleep disturbance and suicidal ideas. The patient is not nervous/anxious.   All other systems reviewed and are negative.    VITALS:   There were no vitals taken for this visit.  Wt Readings from Last 3 Encounters:  03/10/23 153 lb 12.8 oz (69.8 kg)  02/17/23 152 lb 6.4 oz (69.1 kg)  01/27/23 155 lb 3.2 oz (70.4 kg)    There is no height or weight on file to calculate BMI.  Performance status (ECOG): 1 - Symptomatic but completely ambulatory  PHYSICAL EXAM:   Physical Exam Vitals and nursing note reviewed. Exam conducted with a chaperone present.  Constitutional:      Appearance: Normal appearance.  Cardiovascular:     Rate and Rhythm: Normal rate and regular rhythm.     Pulses: Normal pulses.     Heart sounds: Normal heart sounds.  Pulmonary:     Effort: Pulmonary effort is normal.     Breath sounds: Normal breath sounds.  Abdominal:     Palpations: Abdomen is soft. There is no hepatomegaly, splenomegaly or mass.     Tenderness: There is no abdominal tenderness.  Musculoskeletal:     Right lower leg: No edema.     Left lower  leg: No edema.  Lymphadenopathy:     Cervical: No cervical adenopathy.     Right cervical: No superficial, deep or posterior cervical adenopathy.    Left cervical: No superficial, deep or posterior cervical adenopathy.     Upper Body:     Right upper body: No supraclavicular or axillary adenopathy.     Left upper body: No supraclavicular or axillary adenopathy.  Neurological:     General: No focal deficit present.     Mental Status: She is alert and oriented to person, place, and time.  Psychiatric:        Mood and Affect: Mood normal.        Behavior: Behavior normal.     LABS:      Latest Ref Rng & Units 03/10/2023    9:06 AM 02/17/2023    8:53 AM 01/27/2023    9:35 AM  CBC  WBC 4.0 - 10.5 K/uL 5.4  5.0  6.0   Hemoglobin 12.0 - 15.0 g/dL 29.5  62.1  30.8   Hematocrit 36.0 - 46.0 % 34.6  34.6  35.5   Platelets 150 - 400 K/uL 180  180  186       Latest Ref Rng & Units 03/10/2023    9:06 AM 02/17/2023    8:53 AM 01/27/2023    9:35 AM  CMP  Glucose 70 - 99 mg/dL 161  096  045   BUN 8 - 23 mg/dL 30  32  29   Creatinine 0.44 - 1.00 mg/dL 4.09  8.11  9.14   Sodium 135 - 145 mmol/L 134  135  136   Potassium 3.5 - 5.1 mmol/L 4.3  4.1  4.2   Chloride 98 - 111 mmol/L 106  104  104   CO2 22 - 32 mmol/L 23  23  24    Calcium 8.9 - 10.3 mg/dL 9.6  78.2  9.7   Total Protein 6.5 - 8.1 g/dL 6.5  7.2  6.8   Total Bilirubin 0.3 - 1.2 mg/dL 0.5  0.5  0.6   Alkaline Phos 38 - 126 U/L 106  137  107   AST 15 - 41 U/L 12  20  16    ALT 0 - 44 U/L 13  21  14       No results found for: "CEA1", "CEA" / No results found for: "CEA1", "CEA" No results found for: "PSA1" No results found for: "NFA213" No results found for: "CAN125"  No results found for: "TOTALPROTELP", "ALBUMINELP", "A1GS", "A2GS", "BETS", "BETA2SER", "GAMS", "MSPIKE", "SPEI" No results found for: "TIBC", "FERRITIN", "IRONPCTSAT" No results found for: "LDH"   STUDIES:   No results found.

## 2023-03-31 ENCOUNTER — Inpatient Hospital Stay: Payer: Managed Care, Other (non HMO)

## 2023-03-31 ENCOUNTER — Inpatient Hospital Stay: Payer: Managed Care, Other (non HMO) | Admitting: Hematology

## 2023-03-31 ENCOUNTER — Inpatient Hospital Stay: Payer: Managed Care, Other (non HMO) | Attending: Oncology

## 2023-03-31 VITALS — BP 152/82 | HR 60 | Temp 97.4°F | Resp 18 | Wt 154.7 lb

## 2023-03-31 DIAGNOSIS — F17210 Nicotine dependence, cigarettes, uncomplicated: Secondary | ICD-10-CM | POA: Diagnosis not present

## 2023-03-31 DIAGNOSIS — C50812 Malignant neoplasm of overlapping sites of left female breast: Secondary | ICD-10-CM

## 2023-03-31 DIAGNOSIS — Z9012 Acquired absence of left breast and nipple: Secondary | ICD-10-CM | POA: Insufficient documentation

## 2023-03-31 DIAGNOSIS — C50912 Malignant neoplasm of unspecified site of left female breast: Secondary | ICD-10-CM

## 2023-03-31 DIAGNOSIS — Z95828 Presence of other vascular implants and grafts: Secondary | ICD-10-CM

## 2023-03-31 DIAGNOSIS — Z170 Estrogen receptor positive status [ER+]: Secondary | ICD-10-CM | POA: Diagnosis not present

## 2023-03-31 DIAGNOSIS — Z79899 Other long term (current) drug therapy: Secondary | ICD-10-CM | POA: Insufficient documentation

## 2023-03-31 DIAGNOSIS — Z5112 Encounter for antineoplastic immunotherapy: Secondary | ICD-10-CM | POA: Insufficient documentation

## 2023-03-31 DIAGNOSIS — Z79811 Long term (current) use of aromatase inhibitors: Secondary | ICD-10-CM | POA: Insufficient documentation

## 2023-03-31 DIAGNOSIS — C50212 Malignant neoplasm of upper-inner quadrant of left female breast: Secondary | ICD-10-CM | POA: Insufficient documentation

## 2023-03-31 DIAGNOSIS — C50412 Malignant neoplasm of upper-outer quadrant of left female breast: Secondary | ICD-10-CM | POA: Insufficient documentation

## 2023-03-31 LAB — COMPREHENSIVE METABOLIC PANEL
ALT: 11 U/L (ref 0–44)
AST: 14 U/L — ABNORMAL LOW (ref 15–41)
Albumin: 3.7 g/dL (ref 3.5–5.0)
Alkaline Phosphatase: 108 U/L (ref 38–126)
Anion gap: 8 (ref 5–15)
BUN: 32 mg/dL — ABNORMAL HIGH (ref 8–23)
CO2: 23 mmol/L (ref 22–32)
Calcium: 9.9 mg/dL (ref 8.9–10.3)
Chloride: 104 mmol/L (ref 98–111)
Creatinine, Ser: 1.49 mg/dL — ABNORMAL HIGH (ref 0.44–1.00)
GFR, Estimated: 39 mL/min — ABNORMAL LOW (ref >60)
Glucose, Bld: 104 mg/dL — ABNORMAL HIGH (ref 70–99)
Potassium: 4.1 mmol/L (ref 3.5–5.1)
Sodium: 135 mmol/L (ref 135–145)
Total Bilirubin: 0.4 mg/dL (ref 0.3–1.2)
Total Protein: 6.9 g/dL (ref 6.5–8.1)

## 2023-03-31 LAB — CBC WITH DIFFERENTIAL/PLATELET
Abs Immature Granulocytes: 0.04 10*3/uL (ref 0.00–0.07)
Basophils Absolute: 0 10*3/uL (ref 0.0–0.1)
Basophils Relative: 1 %
Eosinophils Absolute: 0.2 10*3/uL (ref 0.0–0.5)
Eosinophils Relative: 3 %
HCT: 35.8 % — ABNORMAL LOW (ref 36.0–46.0)
Hemoglobin: 11.6 g/dL — ABNORMAL LOW (ref 12.0–15.0)
Immature Granulocytes: 1 %
Lymphocytes Relative: 18 %
Lymphs Abs: 1 10*3/uL (ref 0.7–4.0)
MCH: 31.5 pg (ref 26.0–34.0)
MCHC: 32.4 g/dL (ref 30.0–36.0)
MCV: 97.3 fL (ref 80.0–100.0)
Monocytes Absolute: 0.4 10*3/uL (ref 0.1–1.0)
Monocytes Relative: 7 %
Neutro Abs: 4.1 10*3/uL (ref 1.7–7.7)
Neutrophils Relative %: 70 %
Platelets: 211 10*3/uL (ref 150–400)
RBC: 3.68 MIL/uL — ABNORMAL LOW (ref 3.87–5.11)
RDW: 15.9 % — ABNORMAL HIGH (ref 11.5–15.5)
WBC: 5.8 10*3/uL (ref 4.0–10.5)
nRBC: 0 % (ref 0.0–0.2)

## 2023-03-31 LAB — MAGNESIUM: Magnesium: 1.6 mg/dL — ABNORMAL LOW (ref 1.7–2.4)

## 2023-03-31 MED ORDER — SODIUM CHLORIDE 0.9 % IV SOLN
420.0000 mg | Freq: Once | INTRAVENOUS | Status: AC
  Administered 2023-03-31: 420 mg via INTRAVENOUS
  Filled 2023-03-31: qty 14

## 2023-03-31 MED ORDER — TRASTUZUMAB-ANNS CHEMO 150 MG IV SOLR
6.0000 mg/kg | Freq: Once | INTRAVENOUS | Status: AC
  Administered 2023-03-31: 420 mg via INTRAVENOUS
  Filled 2023-03-31: qty 20

## 2023-03-31 MED ORDER — MAGNESIUM SULFATE 2 GM/50ML IV SOLN
2.0000 g | Freq: Once | INTRAVENOUS | Status: AC
  Administered 2023-03-31: 2 g via INTRAVENOUS
  Filled 2023-03-31: qty 50

## 2023-03-31 MED ORDER — HEPARIN SOD (PORK) LOCK FLUSH 100 UNIT/ML IV SOLN
500.0000 [IU] | Freq: Once | INTRAVENOUS | Status: AC | PRN
  Administered 2023-03-31: 500 [IU]

## 2023-03-31 MED ORDER — SODIUM CHLORIDE 0.9 % IV SOLN: Freq: Once | INTRAVENOUS | Status: AC

## 2023-03-31 MED ORDER — SODIUM CHLORIDE 0.9% FLUSH
10.0000 mL | INTRAVENOUS | Status: DC | PRN
  Administered 2023-03-31: 10 mL

## 2023-03-31 MED ORDER — SODIUM CHLORIDE 0.9% FLUSH
10.0000 mL | INTRAVENOUS | Status: DC | PRN
  Administered 2023-03-31: 10 mL via INTRAVENOUS

## 2023-03-31 MED ORDER — ACETAMINOPHEN 325 MG PO TABS: 650.0000 mg | ORAL_TABLET | Freq: Once | ORAL | Status: DC

## 2023-03-31 MED ORDER — CETIRIZINE HCL 10 MG PO TABS: 10.0000 mg | ORAL_TABLET | Freq: Once | ORAL | Status: DC

## 2023-03-31 NOTE — Progress Notes (Signed)
Patient took premeds at home today

## 2023-03-31 NOTE — Progress Notes (Signed)
Patient presents today for Kanjinti and Perjeta infusion per providers order.  Vital signs and labs within parameters for treatment.  Patient has no new complaints at this time.  Patient also to receive Magnesium IV per standing order for Mag level of 1.6  Treatment given today per MD orders.  Stable during infusion without adverse affects.  Vital signs stable.  No complaints at this time.  Discharge from clinic ambulatory in stable condition.  Alert and oriented X 3.  Follow up with [facility] as scheduled.

## 2023-03-31 NOTE — Patient Instructions (Signed)
[facility]  Discharge Instructions: Thank you for choosing [facility] to provide your oncology and hematology care.  If you have a lab appointment with the Cancer Center - please note that after April 8th, 2024, all labs will be drawn in the cancer center.  You do not have to check in or register with the main entrance as you have in the past but will complete your check-in in the cancer center.  Wear comfortable clothing and clothing appropriate for easy access to any Portacath or PICC line.   We strive to give you quality time with your provider. You may need to reschedule your appointment if you arrive late (15 or more minutes).  Arriving late affects you and other patients whose appointments are after yours.  Also, if you miss three or more appointments without notifying the office, you may be dismissed from the clinic at the provider's discretion.      For prescription refill requests, have your pharmacy contact our office and allow 72 hours for refills to be completed.    Today you received the following chemotherapy and/or immunotherapy agents Kanjinti/Perjeta      To help prevent nausea and vomiting after your treatment, we encourage you to take your nausea medication as directed.  BELOW ARE SYMPTOMS THAT SHOULD BE REPORTED IMMEDIATELY: *FEVER GREATER THAN 100.4 F (38 C) OR HIGHER *CHILLS OR SWEATING *NAUSEA AND VOMITING THAT IS NOT CONTROLLED WITH YOUR NAUSEA MEDICATION *UNUSUAL SHORTNESS OF BREATH *UNUSUAL BRUISING OR BLEEDING *URINARY PROBLEMS (pain or burning when urinating, or frequent urination) *BOWEL PROBLEMS (unusual diarrhea, constipation, pain near the anus) TENDERNESS IN MOUTH AND THROAT WITH OR WITHOUT PRESENCE OF ULCERS (sore throat, sores in mouth, or a toothache) UNUSUAL RASH, SWELLING OR PAIN  UNUSUAL VAGINAL DISCHARGE OR ITCHING   Items with * indicate a potential emergency and should be followed up as soon as possible or go  to the Emergency Department if any problems should occur.  Please show the CHEMOTHERAPY ALERT CARD or IMMUNOTHERAPY ALERT CARD at check-in to the Emergency Department and triage nurse.  Should you have questions after your visit or need to cancel or reschedule your appointment, please contact [facility] [phone]  and follow the prompts.  Office hours are 8:00 a.m. to 4:30 p.m. Monday - Friday. Please note that voicemails left after 4:00 p.m. may not be returned until the following business day.  We are closed weekends and major holidays. You have access to a nurse at all times for urgent questions. Please call the main number to the clinic [phone] and follow the prompts.  For any non-urgent questions, you may also contact your provider using MyChart. We now offer e-Visits for anyone 100 and older to request care online for non-urgent symptoms. For details visit [email or web address].   Also download the MyChart app! Go to the app store, search "MyChart", open the app, select [facility], and log in with your MyChart username and password.

## 2023-04-04 ENCOUNTER — Other Ambulatory Visit: Payer: Self-pay | Admitting: Hematology

## 2023-04-11 ENCOUNTER — Ambulatory Visit (HOSPITAL_COMMUNITY)
Admission: RE | Admit: 2023-04-11 | Discharge: 2023-04-11 | Disposition: A | Discharge status: Home / Self Care | Payer: Managed Care, Other (non HMO) | Source: Ambulatory Visit | Attending: Hematology | Admitting: Hematology

## 2023-04-11 DIAGNOSIS — Z79899 Other long term (current) drug therapy: Secondary | ICD-10-CM | POA: Insufficient documentation

## 2023-04-11 LAB — ECHOCARDIOGRAM COMPLETE
AR max vel: 3.75 cm2
AV Area VTI: 3.94 cm2
AV Area mean vel: 3.65 cm2
AV Mean grad: 3.4 mmHg
AV Peak grad: 7.3 mmHg
Ao pk vel: 1.35 m/s
Area-P 1/2: 3.65 cm2
S' Lateral: 2.9 cm

## 2023-04-11 NOTE — Progress Notes (Signed)
*  PRELIMINARY RESULTS* Echocardiogram 2D Echocardiogram has been performed.  Stacey Drain 04/11/2023, 11:31 AM

## 2023-04-13 ENCOUNTER — Encounter (HOSPITAL_COMMUNITY): Payer: Self-pay | Admitting: Hematology

## 2023-04-13 ENCOUNTER — Encounter: Payer: Self-pay | Admitting: Hematology

## 2023-04-18 ENCOUNTER — Ambulatory Visit (HOSPITAL_COMMUNITY)
Admission: RE | Admit: 2023-04-18 | Discharge: 2023-04-18 | Disposition: A | Discharge status: Home / Self Care | Payer: Managed Care, Other (non HMO) | Source: Ambulatory Visit | Attending: Hematology | Admitting: Hematology

## 2023-04-18 DIAGNOSIS — C50912 Malignant neoplasm of unspecified site of left female breast: Secondary | ICD-10-CM | POA: Insufficient documentation

## 2023-04-18 DIAGNOSIS — C7802 Secondary malignant neoplasm of left lung: Secondary | ICD-10-CM | POA: Insufficient documentation

## 2023-04-18 DIAGNOSIS — Z1231 Encounter for screening mammogram for malignant neoplasm of breast: Secondary | ICD-10-CM | POA: Diagnosis present

## 2023-04-18 MED ORDER — IOHEXOL 300 MG/ML  SOLN
75.0000 mL | Freq: Once | INTRAMUSCULAR | Status: AC | PRN
  Administered 2023-04-18: 75 mL via INTRAVENOUS

## 2023-04-19 ENCOUNTER — Other Ambulatory Visit: Payer: Self-pay

## 2023-04-20 NOTE — Progress Notes (Signed)
[facility] [address] [place] [address]    Clinic Day:  05/10/23   Referring physician: [name], MD  Patient Care Team: [name], MD as PCP - General (Internal Medicine) [name], MD as PCP - Cardiology (Cardiology) [name], MD as Medical Oncologist (Medical Oncology) [name], RN as Oncology Nurse Navigator (Medical Oncology)   ASSESSMENT & PLAN:   Assessment: 1. 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 - She noticed lump has gotten bigger and deformed since September 2022.  She has itching over the skin of the breast. - Left breast 4:00 biopsy: Invasive mammary carcinoma, grade 2, ER 100%, PR 70%, Ki-67 25%, HER2 FISH positive - Left breast 2:00 biopsy: Invasive ductal carcinoma, grade 2, ER 100%, PR 70%, Ki-67 25%, HER2 negative - Left axillary lymph node biopsy: IDC, ER 100%, PR 50%, HER2 negative, E-cadherin positive - Bilateral breast MRI on 10/09/2021: Multiple enhancing masses with intervening non-mass enhancement involving most of the LOQ and part of the UOQ.  Abnormal enhancement spans approximately 12.4 x 6.4 x 7 cm.  Diffuse skin thickening/edema.  Multiple left axillary lymph nodes, largest node measuring approximately 2.4 cm in the lower axilla.  Indeterminate non-mass enhancement inferior to the biopsy site in the subareolar right breast measuring 1.5 x 0.9 x 0.8 cm. - Right breast biopsy UOQ on 10/01/2021: Fibroadenomatoid nodule with calcifications, fibrocystic changes including apocrine metaplasia with no malignancy. -PET scan from 10/22/2021: Large left breast mass and numerous hypermetabolic left axillary and subpectoral lymph nodes.  2 hypermetabolic left upper lobe lung nodules measuring 7 mm with SUV 4.2 and 3.2 respectively.  No other hypermetabolic lung nodules.  No distant metastatic disease.  Nonspecific low-level subareolar activity in the right  breast near the surgical clips and a single mildly hypermetabolic right axillary lymph node. - Echocardiogram shows EF 60 to 65%. - She has oligometastatic disease with 2 small lung nodules.  Biopsy is not feasible.  I have recommended TCHP regimen followed by restaging after 3 cycles. -6 cycles of dose reduced TCHP from 11/04/2021 through 03/11/2022 - Maintenance Herceptin and pertuzumab started on 04/01/2022, anastrozole started on 04/01/2022 -Left mastectomy and lymph node dissection (05/05/2022): YPT3YPN2A, residual IDC, grade 2, discontinuously involving fibrotic area measuring 5.9 cm, margins negative, LVI positive, response to therapy seen including hyalin fibrosis and dystrophic calcifications.  9/18 lymph nodes positive, largest focus of metastatic carcinoma measures 1.2 cm with extranodal extension.  Right breast lumpectomy shows atypical lobular hyperplasia. - We discussed switching to Kadcyla in the adjuvant setting.  Related to efficacy of Kadcyla compared with pertuzumab and trastuzumab in the adjuvant setting is unknown.  Patient is reluctant to consider switching as she had poor tolerance with the prior chemotherapy.  Hence we have continued Herceptin and Perjeta. - XRT to the chest wall from 09/13/2022   2. Social/family history: - She lives at home with her husband and daughter.  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  Current active smoker, half pack per day since age 49. - Mother had cervical cancer and bladder cancer.  Maternal grandmother had ovarian cancer.  Brother died at age 64 from acute leukemia.  Another brother died of liver cancer.  Maternal half aunt had breast cancer.    Plan: 1. Oligometastatic (two 7 mm lung lesions) HER2 positive left breast cancer: - CT CAP on 04/18/2023: Stable lung nodules.  New subpleural reticulation anteriorly in the left chest compatible with radiation port.  Stable presacral density. - Reviewed labs from 04/21/2023: Normal LFTs.   CBC grossly normal.  Last tumor marker was 27. - Recommend continuing Herceptin and pertuzumab every 3 weeks.  Continue anastrozole daily.  RTC 12 weeks for follow-up.   2.  Right facial shingles/postherpetic right hemifacial pain: - Continue gabapentin 300 mg twice daily.  Symptoms well-controlled.  3.  Hypomagnesemia: - Continue magnesium 2 tablets twice daily.  Magnesium is normal..  4.  High risk drug monitoring: - Last echo on 09/2022 with normal ejection fraction.  Will repeat echocardiogram based on symptoms.  5.  Hypertension: - Continue amlodipine daily.  Blood pressure is well-controlled.  6.  Left leg DVT (diagnosed 03/04/2022): - Continue Eliquis twice daily.  No bleeding issues.  7.  Osteopenia (DEXA 04/19/2022 T score -2.1): - Continue calcium and vitamin D supplements.    No orders of the defined types were placed in this encounter.     [name],acting as a Neurosurgeon for [name], MD.,have documented all relevant documentation on the behalf of [name], MD,as directed by  [name], MD while in the presence of [name], MD.  I, [name] MD, have reviewed the above documentation for accuracy and completeness, and I agree with the above.    [name], MD   9/17/20246:30 PM  CHIEF COMPLAINT:   Diagnosis: locally advanced left breast cancer    Cancer Staging  Breast cancer, left [facility]) Staging form: Breast, AJCC 8th Edition - Clinical stage from 10/15/2021: Stage IIA (cT3, cN1, cM0, G2, ER+, PR+, HER2+) - Unsigned    Prior Therapy: TCHP x 6 cycles   Current Therapy:  Anastrozole daily, trastuzumab and Herceptin every 21 days    HISTORY OF PRESENT ILLNESS:   Oncology History  Breast cancer, left (HCC)  10/15/2021 Initial Diagnosis   Breast cancer, left (HCC)   11/04/2021 - 04/22/2022 Chemotherapy   Patient is on Treatment Plan : BREAST  Docetaxel + Carboplatin + Trastuzumab + Pertuzumab   (TCHP) q21d       Genetic Testing   Negative genetic testing. No pathogenic variants identified on the [facility] Multi-Cancer+RNA panel. The report date is 11/02/2021.   The Multi-Cancer Panel + RNA offered by [facility] includes sequencing and/or deletion duplication testing of the following 84 genes: AIP, ALK, APC, ATM, AXIN2,BAP1,  BARD1, BLM, BMPR1A, BRCA1, BRCA2, BRIP1, CASR, CDC73, CDH1, CDK4, CDKN1B, CDKN1C, CDKN2A (p14ARF), CDKN2A (p16INK4a), CEBPA, CHEK2, CTNNA1, DICER1, DIS3L2, EGFR (c.2369C>T, p.Thr790Met variant only), EPCAM (Deletion/duplication testing only), FH, FLCN, GATA2, GPC3, GREM1 (Promoter region deletion/duplication testing only), HOXB13 (c.251G>A, p.Gly84Glu), HRAS, KIT, MAX, MEN1, MET, MITF (c.952G>A, p.Glu318Lys variant only), MLH1, MSH2, MSH3, MSH6, MUTYH, NBN, NF1, NF2, NTHL1, PALB2, PDGFRA, PHOX2B, PMS2, POLD1, POLE, POT1, PRKAR1A, PTCH1, PTEN, RAD50, RAD51C, RAD51D, RB1, RECQL4, RET, RUNX1, SDHAF2, SDHA (sequence changes only), SDHB, SDHC, SDHD, SMAD4, SMARCA4, SMARCB1, SMARCE1, STK11, SUFU, TERC, TERT, TMEM127, TP53, TSC1, TSC2, VHL, WRN and WT1.   11/04/2021 -  Chemotherapy   Patient is on Treatment Plan : BREAST  Docetaxel + Carboplatin + Trastuzumab + Pertuzumab  (TCHP) q21d / Trastuzumab + Pertuzumab q21d        INTERVAL HISTORY:   Traci Fuller is a 68 y.o. female presenting to clinic today for follow up of locally advanced left breast cancer. She was last seen by me on 01/27/23.  Since her last visit, she underwent Ct C/A/P on 04/18/23 that found: stable lung nodules;  aortic and systemic atherosclerosis; new subpleural reticulation anteriorly in the left chest compatible with radiation port; stable hazy presacral density potentially from extramedullary hematopoiesis, myelolipoma, or lipoleiomyoma; and abdominal aortic aneurysm 3.0 cm in diameter.  She underwent an echo on 04/11/23.   Today, she states that she is doing well overall. Her appetite level is at 100%. Her energy  level is at 60-70%.  She reports her thumb will occasionally lock up when grabbing things and she has to physically move it back into the right position.  She notes she has had an increase in facial rash breakouts for the past few months. She has been outside in the sun more than usual.   She is taking Magnesium and Eliquis as prescribed, and Gabapentin BID. She is not taking cholesterol medication. She is working at [employer] in October 2024.   PAST MEDICAL HISTORY:   Past Medical History: 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 DES to midLAD, 75% stenosis of RCA not treated   Cancer [facility])    Current smoker    Essential hypertension    Family history of bladder cancer    Family history of breast cancer    Family history of leukemia    Family history of uterine cancer    Hyperlipidemia LDL goal <70    Myocardial infarction (HCC)    PONV (postoperative nausea and vomiting)    Port-A-Cath in place 10/29/2021    Surgical History: Past Surgical History:  Procedure Laterality Date   ABDOMINAL HYSTERECTOMY     BREAST BIOPSY Bilateral 10/16/2021   BREAST BIOPSY WITH RADIO FREQUENCY LOCALIZER Right 05/05/2022   Procedure: BREAST BIOPSY WITH RADIO FREQUENCY LOCALIZER;  Surgeon: Lucretia Roers, MD;  Location: [facility]S;  Service: General;  Laterality: Right;   CORONARY STENT INTERVENTION N/A 01/10/2018   Procedure: CORONARY STENT INTERVENTION;  Surgeon: Corky Crafts, MD;  Location: [facility] INVASIVE CV LAB;  Service: Cardiovascular;  Laterality: N/A;   LEFT HEART CATH AND CORONARY ANGIOGRAPHY N/A 01/10/2018   Procedure: LEFT HEART CATH AND CORONARY ANGIOGRAPHY;  Surgeon: Corky Crafts, MD;  Location: [facility] INVASIVE CV LAB;  Service: Cardiovascular;  Laterality: N/A;   MASTECTOMY MODIFIED RADICAL Left 05/05/2022   Procedure: MASTECTOMY MODIFIED RADICAL WITH RADIO FREQUENCY LOCALIZER;  Surgeon: Lucretia Roers, MD;  Location:  [facility] OR[facility];  Service: General;  Laterality: Left;   PORTACATH PLACEMENT Right 10/26/2021   Procedure: INSERTION PORT-A-CATH;  Surgeon: Lucretia Roers, MD;  Location: [facility] ORS;  Service: General;  Laterality: Right;    Social History: Social History   Socioeconomic History   Marital status: Married    Spouse name: Not on file   Number of children: Not on file   Years of education: Not on file   Highest education level: Not on file  Occupational History   Not on file  Tobacco Use   Smoking status: Every Day    Current packs/day: 1.00    Average packs/day: 1 pack/day for 48.7 years (48.7 ttl pk-yrs)    Types: Cigarettes    Start date: 1976   Smokeless tobacco: Never  Vaping Use   Vaping status: Never Used  Substance and Sexual Activity   Alcohol use: Not Currently   Drug use: Never   Sexual activity: Yes  Other Topics Concern   Not on file  Social History Narrative   Not on file   Social Determinants of Health   Financial  Resource Strain: Not on file  Food Insecurity: No Food Insecurity (05/05/2022)   Hunger Vital Sign    Worried About Running Out of Food in the Last Year: Never true    Ran Out of Food in the Last Year: Never true  Transportation Needs: No Transportation Needs (05/05/2022)   PRAPARE - Administrator, Civil Service (Medical): No    Lack of Transportation (Non-Medical): No  Physical Activity: Not on file  Stress: Not on file  Social Connections: Unknown (12/06/2022)   [facility], [facility]   Social Network    Social Network: Not on file  Intimate Partner Violence: Unknown (12/06/2022)   Received from [facility], [facility]   HITS    Physically Hurt: Not on file    Insult or Talk Down To: Not on file    Threaten Physical Harm: Not on file    Scream or Curse: Not on file    Family History: Family History  Problem Relation Age of Onset   Bladder Cancer Mother 14   Cervical cancer Mother        dx 34s, hysterectomy    Hypertension Father    Cirrhosis Father    Leukemia Brother 63   Liver cancer Brother 78   Breast cancer Maternal Aunt        dx 68s   Cancer Maternal Uncle        unk type d. 23   Cancer Paternal Aunt        unk type   Breast cancer Maternal Grandmother        dx 42s   Uterine cancer Maternal Grandmother     Current Medications:  Current Outpatient Medications:    ALPRAZolam (XANAX) 0.25 MG tablet, TAKE 1 TABLET(0.25 MG) BY MOUTH TWICE DAILY AS NEEDED FOR ANXIETY, Disp: 60 tablet, Rfl: 0   amLODipine (NORVASC) 10 MG tablet, Take 10 mg by mouth daily., Disp: , Rfl:    anastrozole (ARIMIDEX) 1 MG tablet, TAKE 1 TABLET(1 MG) BY MOUTH DAILY, Disp: 90 tablet, Rfl: 4   apixaban (ELIQUIS) 5 MG TABS tablet, TAKE 1 TABLET(5 MG) BY MOUTH TWICE DAILY, Disp: 60 tablet, Rfl: 5   aspirin EC 81 MG tablet, Take 1 tablet (81 mg total) by mouth daily., Disp: 90 tablet, Rfl: 3   Calcium Carb-Cholecalciferol (CALCIUM 600/VITAMIN D3 PO), Take 1 tablet by mouth in the morning and at bedtime., Disp: , Rfl:    Evolocumab (REPATHA SURECLICK) 140 MG/ML SOAJ, Inject 140 mg into the skin every 14 (fourteen) days., Disp: 2 mL, Rfl: 11   gabapentin (NEURONTIN) 300 MG capsule, Take 300 mg by mouth 2 (two) times daily., Disp: , Rfl:    metoprolol tartrate (LOPRESSOR) 25 MG tablet, Take 1 tablet (25 mg total) by mouth 2 (two) times daily. Please call and schedule overdue appointment with Dr. Allyson Sabal for further refills . SECOND ATTEMPT!, Disp: 30 tablet, Rfl: 0   mometasone (ELOCON) 0.1 % cream, Apply topically 2 (two) times daily., Disp: , Rfl:    omeprazole (PRILOSEC OTC) 20 MG tablet, Take 20 mg by mouth daily., Disp: , Rfl:    ondansetron (ZOFRAN-ODT) 4 MG disintegrating tablet, Take 1 tablet (4 mg total) by mouth every 6 (six) hours as needed for nausea., Disp: 20 tablet, Rfl: 0   oxyCODONE (OXY IR/ROXICODONE) 5 MG immediate release tablet, Take 1-2 tablets (5-10 mg total) by mouth every 4 (four) hours as needed  for severe pain or breakthrough pain., Disp:  30 tablet, Rfl: 0   pegfilgrastim-bmez (ZIEXTENZO) 6 MG/0.6ML injection, Inject 6 mg into the skin every 21 ( twenty-one) days. AFTER chemotherapy., Disp: , Rfl:    Pertuzumab (PERJETA IV), Inject into the vein every 21 ( twenty-one) days., Disp: , Rfl:    sulfamethoxazole-trimethoprim (BACTRIM DS) 800-160 MG tablet, Take 1 tablet by mouth 2 (two) times daily., Disp: 14 tablet, Rfl: 0   trastuzumab-anns (KANJINTI) 150 MG SOLR injection, Inject 462 mg into the vein every 21 ( twenty-one) days., Disp: , Rfl:    ciprofloxacin (CIPRO) 500 MG tablet, Take 1 tablet (500 mg total) by mouth 2 (two) times daily for 7 days., Disp: 14 tablet, Rfl: 0   magnesium oxide (MAG-OX) 400 (240 Mg) MG tablet, Take 2 tablets (800 mg total) by mouth 2 (two) times daily., Disp: 120 tablet, Rfl: 4   Allergies: No Known Allergies  REVIEW OF SYSTEMS:   Review of Systems  Constitutional:  Negative for chills, fatigue and fever.  HENT:   Negative for lump/mass, mouth sores, nosebleeds, sore throat and trouble swallowing.   Eyes:  Negative for eye problems.  Respiratory:  Negative for cough and shortness of breath.   Cardiovascular:  Negative for chest pain, leg swelling and palpitations.  Gastrointestinal:  Negative for abdominal pain, constipation, diarrhea, nausea and vomiting.  Genitourinary:  Negative for bladder incontinence, difficulty urinating, dysuria, frequency, hematuria and nocturia.   Musculoskeletal:  Negative for arthralgias, back pain, flank pain, myalgias and neck pain.  Skin:  Positive for rash (facial acne). Negative for itching.  Neurological:  Positive for headaches and numbness (in fingers). Negative for dizziness.  Hematological:  Does not bruise/bleed easily.  Psychiatric/Behavioral:  Negative for depression, sleep disturbance and suicidal ideas. The patient is not nervous/anxious.   All other systems reviewed and are negative.    VITALS:   There  were no vitals taken for this visit.  Wt Readings from Last 3 Encounters:  04/21/23 155 lb 9.6 oz (70.6 kg)  03/31/23 154 lb 11.2 oz (70.2 kg)  03/10/23 153 lb 12.8 oz (69.8 kg)    There is no height or weight on file to calculate BMI.  Performance status (ECOG): 1 - Symptomatic but completely ambulatory  PHYSICAL EXAM:   Physical Exam Vitals and nursing note reviewed. Exam conducted with a chaperone present.  Constitutional:      Appearance: Normal appearance.  Cardiovascular:     Rate and Rhythm: Normal rate and regular rhythm.     Pulses: Normal pulses.     Heart sounds: Normal heart sounds.  Pulmonary:     Effort: Pulmonary effort is normal.     Breath sounds: Normal breath sounds.  Abdominal:     Palpations: Abdomen is soft. There is no hepatomegaly, splenomegaly or mass.     Tenderness: There is no abdominal tenderness.  Musculoskeletal:     Right lower leg: No edema.     Left lower leg: No edema.  Lymphadenopathy:     Cervical: No cervical adenopathy.     Right cervical: No superficial, deep or posterior cervical adenopathy.    Left cervical: No superficial, deep or posterior cervical adenopathy.     Upper Body:     Right upper body: No supraclavicular or axillary adenopathy.     Left upper body: No supraclavicular or axillary adenopathy.  Neurological:     General: No focal deficit present.     Mental Status: She is alert and oriented to person, place, and time.  Psychiatric:  Mood and Affect: Mood normal.        Behavior: Behavior normal.     LABS:      Latest Ref Rng & Units 04/21/2023    9:33 AM 03/31/2023    9:56 AM 03/10/2023    9:06 AM  CBC  WBC 4.0 - 10.5 K/uL 5.1  5.8  5.4   Hemoglobin 12.0 - 15.0 g/dL 16.1  09.6  04.5   Hematocrit 36.0 - 46.0 % 35.6  35.8  34.6   Platelets 150 - 400 K/uL 193  211  180       Latest Ref Rng & Units 04/21/2023    9:33 AM 03/31/2023    9:56 AM 03/10/2023    9:06 AM  CMP  Glucose 70 - 99 mg/dL 409  811  914    BUN 8 - 23 mg/dL 28  32  30   Creatinine 0.44 - 1.00 mg/dL 7.82  9.56  2.13   Sodium 135 - 145 mmol/L 136  135  134   Potassium 3.5 - 5.1 mmol/L 4.0  4.1  4.3   Chloride 98 - 111 mmol/L 105  104  106   CO2 22 - 32 mmol/L 23  23  23    Calcium 8.9 - 10.3 mg/dL 9.6  9.9  9.6   Total Protein 6.5 - 8.1 g/dL 6.7  6.9  6.5   Total Bilirubin 0.3 - 1.2 mg/dL 0.6  0.4  0.5   Alkaline Phos 38 - 126 U/L 107  108  106   AST 15 - 41 U/L 14  14  12    ALT 0 - 44 U/L 12  11  13       No results found for: "CEA1", "CEA" / No results found for: "CEA1", "CEA" No results found for: "PSA1" No results found for: "YQM578" No results found for: "CAN125"  No results found for: "TOTALPROTELP", "ALBUMINELP", "A1GS", "A2GS", "BETS", "BETA2SER", "GAMS", "MSPIKE", "SPEI" No results found for: "TIBC", "FERRITIN", "IRONPCTSAT" No results found for: "LDH"   STUDIES:   CT CHEST ABDOMEN PELVIS W CONTRAST  Result Date: 04/20/2023 CLINICAL DATA:  Metastatic left breast cancer the lung. Current chemotherapy. Restaging assessment. * Tracking Code: BO * EXAM: CT CHEST, ABDOMEN, AND PELVIS WITH CONTRAST TECHNIQUE: Multidetector CT imaging of the chest, abdomen and pelvis was performed following the standard protocol during bolus administration of intravenous contrast. RADIATION DOSE REDUCTION: This exam was performed according to the departmental dose-optimization program which includes automated exposure control, adjustment of the mA and/or kV according to patient size and/or use of iterative reconstruction technique. CONTRAST:  75mL OMNIPAQUE IOHEXOL 300 MG/ML  SOLN COMPARISON:  06/17/2022 FINDINGS: CT CHEST FINDINGS Cardiovascular: Right Port-A-Cath tip: SVC. Coronary, aortic arch, and branch vessel atherosclerotic vascular disease. Mediastinum/Nodes: Left axillary dissection in the past with reduced associated nodularity/scarring, currently density in this vicinity measures 0.9 by 2.3 cm on image 15 series 2, formerly 1.1 by  2.5 cm. No pathologic adenopathy. Lungs/Pleura: Centrilobular emphysema. 6 by 5 mm right lower lobe nodule on image 88 series 3, stable. Several small subpleural nodules in the 3-5 mm range in the right lung are stable. New subpleural reticulation and hazy opacity in the left chest compatible with radiation port. Left basilar subpleural nodule 5 by 4 mm on image 100 series 3, stable. Musculoskeletal: Unremarkable CT ABDOMEN PELVIS FINDINGS Hepatobiliary: Unremarkable Pancreas: Unremarkable Spleen: Unremarkable Adrenals/Urinary Tract: Minimal right adrenal nodularity similar to prior. The kidneys appear unremarkable. Urinary bladder unremarkable. Stomach/Bowel: Unremarkable Vascular/Lymphatic: Atherosclerosis  is present, including aortoiliac atherosclerotic disease. 3.0 cm infrarenal abdominal aortic aneurysm. Reproductive: Uterus absent.  Adnexa unremarkable. Other: We again observed hazy presacral density potentially from extramedullary hematopoiesis, myelolipoma, or lipoleiomyoma. Musculoskeletal: Degenerative disc disease L1-2. IMPRESSION: 1. Stable lung nodules. 2. Aortic and systemic atherosclerosis. 3. New subpleural reticulation anteriorly in the left chest compatible with radiation port. 4. Stable hazy presacral density potentially from extramedullary hematopoiesis, myelolipoma, or lipoleiomyoma. 5. Stable small pulmonary nodules. 6. Abdominal aortic aneurysm 3.0 cm in diameter. Recommend follow-up ultrasound every 3 years. This recommendation follows ACR consensus guidelines: White Paper of the ACR Incidental Findings Committee II on Vascular Findings. J Am Coll Radiol 2013; 10:789-794. 7. Emphysema. Aortic Atherosclerosis (ICD10-I70.0) and Emphysema (ICD10-J43.9). Electronically Signed   By: Gaylyn Rong M.D.   On: 04/20/2023 17:29   MM 3D SCREENING MAMMOGRAM UNILATERAL RIGHT BREAST  Result Date: 04/19/2023 CLINICAL DATA:  Screening. EXAM: DIGITAL SCREENING UNILATERAL RIGHT MAMMOGRAM WITH CAD AND  TOMOSYNTHESIS TECHNIQUE: Right screening digital craniocaudal and mediolateral oblique mammograms were obtained. Right screening digital breast tomosynthesis was performed. The images were evaluated with computer-aided detection. COMPARISON:  Previous exam(s). ACR Breast Density Category b: There are scattered areas of fibroglandular density. FINDINGS: There are no findings suspicious for malignancy. IMPRESSION: No mammographic evidence of malignancy. A result letter of this screening mammogram will be mailed directly to the patient. RECOMMENDATION: Screening mammogram in one year. (Code:SM-B-01Y) BI-RADS CATEGORY  1: Negative. Electronically Signed   By: Sherian Rein M.D.   On: 04/19/2023 16:15   ECHOCARDIOGRAM COMPLETE  Result Date: 04/11/2023    ECHOCARDIOGRAM REPORT   Patient Name:   Traci Fuller Date of Exam: 04/11/2023 Medical Rec #:  [id number]       Height:       61.0 in Accession #:    [id number]      Weight:       154.7 lb Date of Birth:  1956/06/11       BSA:          1.693 m Patient Age:    66 years        BP:           122/72 mmHg Patient Gender: F               HR:           67 bpm. [facility] Procedure: 2D Echo, Cardiac Doppler and Color Doppler Indications:    Chemo Z09  History:        Patient has prior history of Echocardiogram examinations, most                 recent 10/15/2022. CAD and Previous Myocardial Infarction; Risk                 Factors:Hypertension, Dyslipidemia and Former Smoker. Left                 breast cancer. Metastatic breast cancer. S/P percutaneous                 coronary angioplasty.  Sonographer:    Celesta Gentile RCS Referring Phys: [phone] [name] IMPRESSIONS  1. Left ventricular ejection fraction, by estimation, is 60 to 65%. The left ventricle has normal function. The left ventricle has no regional wall motion abnormalities. Left ventricular diastolic parameters were normal.  2. Right ventricular systolic function is normal. The right  ventricular size is mildly enlarged. There is normal pulmonary artery systolic pressure.  3. Left atrial size was mildly dilated.  4. The mitral valve is normal in structure. Trivial mitral valve regurgitation. No evidence of mitral stenosis.  5. The tricuspid valve is abnormal.  6. The aortic valve is tricuspid. Aortic valve regurgitation is mild. No aortic stenosis is present. FINDINGS  Left Ventricle: Left ventricular ejection fraction, by estimation, is 60 to 65%. The left ventricle has normal function. The left ventricle has no regional wall motion abnormalities. The left ventricular internal cavity size was normal in size. There is  no left ventricular hypertrophy. Left ventricular diastolic parameters were normal. Right Ventricle: The right ventricular size is mildly enlarged. Right vetricular wall thickness was not well visualized. Right ventricular systolic function is normal. There is normal pulmonary artery systolic pressure. The tricuspid regurgitant velocity  is 2.40 m/s, and with an assumed right atrial pressure of 3 mmHg, the estimated right ventricular systolic pressure is 26.0 mmHg. Left Atrium: Left atrial size was mildly dilated. Right Atrium: Right atrial size was normal in size. Pericardium: There is no evidence of pericardial effusion. Presence of epicardial fat layer. Mitral Valve: The mitral valve is normal in structure. Trivial mitral valve regurgitation. No evidence of mitral valve stenosis. Tricuspid Valve: The tricuspid valve is abnormal. Tricuspid valve regurgitation is mild . No evidence of tricuspid stenosis. Aortic Valve: The aortic valve is tricuspid. Aortic valve regurgitation is mild. No aortic stenosis is present. Aortic valve mean gradient measures 3.4 mmHg. Aortic valve peak gradient measures 7.3 mmHg. Aortic valve area, by VTI measures 3.94 cm. Pulmonic Valve: The pulmonic valve was not well visualized. Pulmonic valve regurgitation is not visualized. No evidence of pulmonic  stenosis. Aorta: The aortic root is normal in size and structure. IAS/Shunts: No atrial level shunt detected by color flow Doppler.  LEFT VENTRICLE PLAX 2D LVIDd:         4.40 cm   Diastology LVIDs:         2.90 cm   LV e' medial:    7.83 cm/s LV PW:         0.90 cm   LV E/e' medial:  11.1 LV IVS:        0.90 cm   LV e' lateral:   10.00 cm/s LVOT diam:     2.20 cm   LV E/e' lateral: 8.7 LV SV:         106 LV SV Index:   63 LVOT Area:     3.80 cm  RIGHT VENTRICLE RV S prime:     8.92 cm/s TAPSE (M-mode): 2.3 cm LEFT ATRIUM             Index        RIGHT ATRIUM           Index LA diam:        3.80 cm 2.24 cm/m   RA Area:     17.70 cm LA Vol (A2C):   77.6 ml 45.82 ml/m  RA Volume:   48.60 ml  28.70 ml/m LA Vol (A4C):   54.8 ml 32.36 ml/m LA Biplane Vol: 68.9 ml 40.69 ml/m  AORTIC VALVE AV Area (Vmax):    3.75 cm AV Area (Vmean):   3.65 cm AV Area (VTI):     3.94 cm AV Vmax:           134.65 cm/s AV Vmean:          84.206 cm/s AV VTI:            0.270  m AV Peak Grad:      7.3 mmHg AV Mean Grad:      3.4 mmHg LVOT Vmax:         133.00 cm/s LVOT Vmean:        80.800 cm/s LVOT VTI:          0.280 m LVOT/AV VTI ratio: 1.04  AORTA Ao Root diam: 3.40 cm MITRAL VALVE               TRICUSPID VALVE MV Area (PHT): 3.65 cm    TR Peak grad:   23.0 mmHg MV Decel Time: 208 msec    TR Vmax:        240.00 cm/s MV E velocity: 86.70 cm/s MV A velocity: 75.80 cm/s  SHUNTS MV E/A ratio:  1.14        Systemic VTI:  0.28 m                            Systemic Diam: 2.20 cm Dina Rich MD Electronically signed by Dina Rich MD Signature Date/Time: 04/11/2023/12:08:43 PM    Final

## 2023-04-21 ENCOUNTER — Inpatient Hospital Stay: Payer: Managed Care, Other (non HMO)

## 2023-04-21 ENCOUNTER — Inpatient Hospital Stay: Payer: Managed Care, Other (non HMO) | Admitting: Hematology

## 2023-04-21 VITALS — BP 138/81 | HR 58 | Temp 97.6°F | Resp 18

## 2023-04-21 DIAGNOSIS — Z95828 Presence of other vascular implants and grafts: Secondary | ICD-10-CM

## 2023-04-21 DIAGNOSIS — C50412 Malignant neoplasm of upper-outer quadrant of left female breast: Secondary | ICD-10-CM | POA: Diagnosis not present

## 2023-04-21 DIAGNOSIS — C50912 Malignant neoplasm of unspecified site of left female breast: Secondary | ICD-10-CM

## 2023-04-21 DIAGNOSIS — C50812 Malignant neoplasm of overlapping sites of left female breast: Secondary | ICD-10-CM

## 2023-04-21 DIAGNOSIS — Z170 Estrogen receptor positive status [ER+]: Secondary | ICD-10-CM

## 2023-04-21 LAB — CBC WITH DIFFERENTIAL/PLATELET
Abs Immature Granulocytes: 0.01 10*3/uL (ref 0.00–0.07)
Basophils Absolute: 0 10*3/uL (ref 0.0–0.1)
Basophils Relative: 1 %
Eosinophils Absolute: 0.2 10*3/uL (ref 0.0–0.5)
Eosinophils Relative: 4 %
HCT: 35.6 % — ABNORMAL LOW (ref 36.0–46.0)
Hemoglobin: 11.6 g/dL — ABNORMAL LOW (ref 12.0–15.0)
Immature Granulocytes: 0 %
Lymphocytes Relative: 16 %
Lymphs Abs: 0.8 10*3/uL (ref 0.7–4.0)
MCH: 31.7 pg (ref 26.0–34.0)
MCHC: 32.6 g/dL (ref 30.0–36.0)
MCV: 97.3 fL (ref 80.0–100.0)
Monocytes Absolute: 0.3 10*3/uL (ref 0.1–1.0)
Monocytes Relative: 6 %
Neutro Abs: 3.7 10*3/uL (ref 1.7–7.7)
Neutrophils Relative %: 73 %
Platelets: 193 10*3/uL (ref 150–400)
RBC: 3.66 MIL/uL — ABNORMAL LOW (ref 3.87–5.11)
RDW: 15.2 % (ref 11.5–15.5)
WBC: 5.1 10*3/uL (ref 4.0–10.5)
nRBC: 0 % (ref 0.0–0.2)

## 2023-04-21 LAB — COMPREHENSIVE METABOLIC PANEL
ALT: 12 U/L (ref 0–44)
AST: 14 U/L — ABNORMAL LOW (ref 15–41)
Albumin: 3.7 g/dL (ref 3.5–5.0)
Alkaline Phosphatase: 107 U/L (ref 38–126)
Anion gap: 8 (ref 5–15)
BUN: 28 mg/dL — ABNORMAL HIGH (ref 8–23)
CO2: 23 mmol/L (ref 22–32)
Calcium: 9.6 mg/dL (ref 8.9–10.3)
Chloride: 105 mmol/L (ref 98–111)
Creatinine, Ser: 1.51 mg/dL — ABNORMAL HIGH (ref 0.44–1.00)
GFR, Estimated: 38 mL/min — ABNORMAL LOW (ref >60)
Glucose, Bld: 125 mg/dL — ABNORMAL HIGH (ref 70–99)
Potassium: 4 mmol/L (ref 3.5–5.1)
Sodium: 136 mmol/L (ref 135–145)
Total Bilirubin: 0.6 mg/dL (ref 0.3–1.2)
Total Protein: 6.7 g/dL (ref 6.5–8.1)

## 2023-04-21 LAB — MAGNESIUM: Magnesium: 1.7 mg/dL (ref 1.7–2.4)

## 2023-04-21 MED ORDER — HEPARIN SOD (PORK) LOCK FLUSH 100 UNIT/ML IV SOLN
500.0000 [IU] | Freq: Once | INTRAVENOUS | Status: AC | PRN
  Administered 2023-04-21: 500 [IU]

## 2023-04-21 MED ORDER — SODIUM CHLORIDE 0.9 % IV SOLN
420.0000 mg | Freq: Once | INTRAVENOUS | Status: AC
  Administered 2023-04-21: 420 mg via INTRAVENOUS
  Filled 2023-04-21: qty 14

## 2023-04-21 MED ORDER — SODIUM CHLORIDE 0.9% FLUSH
10.0000 mL | INTRAVENOUS | Status: DC | PRN
  Administered 2023-04-21: 10 mL via INTRAVENOUS

## 2023-04-21 MED ORDER — TRASTUZUMAB-ANNS CHEMO 150 MG IV SOLR
6.0000 mg/kg | Freq: Once | INTRAVENOUS | Status: AC
  Administered 2023-04-21: 420 mg via INTRAVENOUS
  Filled 2023-04-21: qty 20

## 2023-04-21 MED ORDER — SODIUM CHLORIDE 0.9 % IV SOLN: Freq: Once | INTRAVENOUS | Status: AC

## 2023-04-21 MED ORDER — MAGNESIUM OXIDE -MG SUPPLEMENT 400 (240 MG) MG PO TABS: 800.0000 mg | ORAL_TABLET | Freq: Two times a day (BID) | ORAL | 4 refills | Status: DC

## 2023-04-21 MED ORDER — SODIUM CHLORIDE 0.9% FLUSH
10.0000 mL | INTRAVENOUS | Status: DC | PRN
  Administered 2023-04-21: 10 mL

## 2023-04-21 NOTE — Progress Notes (Signed)
Patient has been examined by Dr. Katragadda. Vital signs and labs have been reviewed by MD - ANC, Creatinine, LFTs, hemoglobin, and platelets are within treatment parameters per M.D. - pt may proceed with treatment.  Primary RN and pharmacy notified.  

## 2023-04-21 NOTE — Progress Notes (Signed)
Patient took own premeds from home.   Patient tolerated therapy with no complaints voiced.  Side effects with management reviewed with understanding verbalized.  Port site clean and dry with no bruising or swelling noted at site.  Good blood return noted before and after administration of therapy.  Band aid applied.  Patient left in satisfactory condition with VSS and no s/s of distress noted.

## 2023-04-21 NOTE — Patient Instructions (Signed)
[facility]  Discharge Instructions: Thank you for choosing [facility] to provide your oncology and hematology care.  If you have a lab appointment with the Cancer Center - please note that after April 8th, 2024, all labs will be drawn in the cancer center.  You do not have to check in or register with the main entrance as you have in the past but will complete your check-in in the cancer center.  Wear comfortable clothing and clothing appropriate for easy access to any Portacath or PICC line.   We strive to give you quality time with your provider. You may need to reschedule your appointment if you arrive late (15 or more minutes).  Arriving late affects you and other patients whose appointments are after yours.  Also, if you miss three or more appointments without notifying the office, you may be dismissed from the clinic at the provider's discretion.      For prescription refill requests, have your pharmacy contact our office and allow 72 hours for refills to be completed.    Today you received the following chemotherapy and/or immunotherapy agents kanjinity, perjeta.       To help prevent nausea and vomiting after your treatment, we encourage you to take your nausea medication as directed.  BELOW ARE SYMPTOMS THAT SHOULD BE REPORTED IMMEDIATELY: *FEVER GREATER THAN 100.4 F (38 C) OR HIGHER *CHILLS OR SWEATING *NAUSEA AND VOMITING THAT IS NOT CONTROLLED WITH YOUR NAUSEA MEDICATION *UNUSUAL SHORTNESS OF BREATH *UNUSUAL BRUISING OR BLEEDING *URINARY PROBLEMS (pain or burning when urinating, or frequent urination) *BOWEL PROBLEMS (unusual diarrhea, constipation, pain near the anus) TENDERNESS IN MOUTH AND THROAT WITH OR WITHOUT PRESENCE OF ULCERS (sore throat, sores in mouth, or a toothache) UNUSUAL RASH, SWELLING OR PAIN  UNUSUAL VAGINAL DISCHARGE OR ITCHING   Items with * indicate a potential emergency and should be followed up as soon as possible or  go to the Emergency Department if any problems should occur.  Please show the CHEMOTHERAPY ALERT CARD or IMMUNOTHERAPY ALERT CARD at check-in to the Emergency Department and triage nurse.  Should you have questions after your visit or need to cancel or reschedule your appointment, please contact [facility] AT [facility] [phone]  and follow the prompts.  Office hours are 8:00 a.m. to 4:30 p.m. Monday - Friday. Please note that voicemails left after 4:00 p.m. may not be returned until the following business day.  We are closed weekends and major holidays. You have access to a nurse at all times for urgent questions. Please call the main number to the clinic [phone] and follow the prompts.  For any non-urgent questions, you may also contact your provider using MyChart. We now offer e-Visits for anyone 11 and older to request care online for non-urgent symptoms. For details visit [email or web address].   Also download the MyChart app! Go to the app store, search "MyChart", open the app, select [place], and log in with your MyChart username and password.

## 2023-04-21 NOTE — Patient Instructions (Addendum)
[facility] at [facility] Discharge Instructions   You were seen and examined today by Dr. Ellin Saba.  He reviewed the results of your lab work which are normal/stable.   He reviewed the results of your CT scan which is stable. No cancer has spread and the lung nodules are unchanged.   We will proceed with your treatment today.   Return as scheduled.    Thank you for choosing [facility] at [facility] to provide your oncology and hematology care.  To afford each patient quality time with our provider, please arrive at least 15 minutes before your scheduled appointment time.   If you have a lab appointment with the Cancer Center please come in thru the Main Entrance and check in at the main information desk.  You need to re-schedule your appointment should you arrive 10 or more minutes late.  We strive to give you quality time with our providers, and arriving late affects you and other patients whose appointments are after yours.  Also, if you no show three or more times for appointments you may be dismissed from the clinic at the providers discretion.     Again, thank you for choosing [facility].  Our hope is that these requests will decrease the amount of time that you wait before being seen by our physicians.       _____________________________________________________________  Should you have questions after your visit to [facility], please contact our office at [phone] and follow the prompts.  Our office hours are 8:00 a.m. and 4:30 p.m. Monday - Friday.  Please note that voicemails left after 4:00 p.m. may not be returned until the following business day.  We are closed weekends and major holidays.  You do have access to a nurse 24-7, just call the main number to the clinic [phone] and do not press any options, hold on the line and a nurse will answer the phone.    For prescription refill requests,  have your pharmacy contact our office and allow 72 hours.    Due to Covid, you will need to wear a mask upon entering the hospital. If you do not have a mask, a mask will be given to you at the Main Entrance upon arrival. For doctor visits, patients may have 1 support person age 6 or older with them. For treatment visits, patients can not have anyone with them due to social distancing guidelines and our immunocompromised population.

## 2023-04-22 ENCOUNTER — Other Ambulatory Visit: Payer: Self-pay

## 2023-05-02 ENCOUNTER — Encounter (HOSPITAL_COMMUNITY): Payer: Self-pay | Admitting: Hematology

## 2023-05-02 ENCOUNTER — Other Ambulatory Visit: Payer: Self-pay | Admitting: *Deleted

## 2023-05-02 ENCOUNTER — Encounter: Payer: Self-pay | Admitting: Hematology

## 2023-05-02 ENCOUNTER — Inpatient Hospital Stay: Payer: Managed Care, Other (non HMO) | Attending: Oncology

## 2023-05-02 DIAGNOSIS — Z79811 Long term (current) use of aromatase inhibitors: Secondary | ICD-10-CM | POA: Insufficient documentation

## 2023-05-02 DIAGNOSIS — Z9012 Acquired absence of left breast and nipple: Secondary | ICD-10-CM | POA: Diagnosis not present

## 2023-05-02 DIAGNOSIS — R399 Unspecified symptoms and signs involving the genitourinary system: Secondary | ICD-10-CM

## 2023-05-02 DIAGNOSIS — Z170 Estrogen receptor positive status [ER+]: Secondary | ICD-10-CM | POA: Insufficient documentation

## 2023-05-02 DIAGNOSIS — C50412 Malignant neoplasm of upper-outer quadrant of left female breast: Secondary | ICD-10-CM | POA: Insufficient documentation

## 2023-05-02 DIAGNOSIS — F17210 Nicotine dependence, cigarettes, uncomplicated: Secondary | ICD-10-CM | POA: Insufficient documentation

## 2023-05-02 DIAGNOSIS — Z5112 Encounter for antineoplastic immunotherapy: Secondary | ICD-10-CM | POA: Diagnosis present

## 2023-05-02 DIAGNOSIS — N3000 Acute cystitis without hematuria: Secondary | ICD-10-CM

## 2023-05-02 DIAGNOSIS — Z79899 Other long term (current) drug therapy: Secondary | ICD-10-CM | POA: Diagnosis not present

## 2023-05-02 DIAGNOSIS — C50212 Malignant neoplasm of upper-inner quadrant of left female breast: Secondary | ICD-10-CM | POA: Diagnosis present

## 2023-05-02 DIAGNOSIS — R300 Dysuria: Secondary | ICD-10-CM

## 2023-05-02 DIAGNOSIS — C50912 Malignant neoplasm of unspecified site of left female breast: Secondary | ICD-10-CM

## 2023-05-02 LAB — URINALYSIS, ROUTINE W REFLEX MICROSCOPIC
Bilirubin Urine: NEGATIVE
Glucose, UA: NEGATIVE mg/dL
Ketones, ur: NEGATIVE mg/dL
Nitrite: POSITIVE — AB
Protein, ur: 100 mg/dL — AB
Specific Gravity, Urine: 1.019 (ref 1.005–1.030)
WBC, UA: >50 WBC/hpf (ref 0–5)
pH: 5 (ref 5.0–8.0)

## 2023-05-02 NOTE — Progress Notes (Addendum)
Patient called to advise that she is having symptoms of UTI, to include severe burning with urination.  Per Dr. Ellin Saba, will obtain a UA with reflex today.  Antibiotic sent to pharmacy and will notify patient if culture is not sensitive to bactrim.

## 2023-05-04 ENCOUNTER — Other Ambulatory Visit: Payer: Self-pay | Admitting: *Deleted

## 2023-05-04 LAB — URINE CULTURE: Culture: >=100000 — AB

## 2023-05-04 MED ORDER — CIPROFLOXACIN HCL 500 MG PO TABS
500.0000 mg | ORAL_TABLET | Freq: Two times a day (BID) | ORAL | 0 refills | Status: AC
Start: 2023-05-04 — End: 2023-05-11

## 2023-05-06 ENCOUNTER — Other Ambulatory Visit: Payer: Self-pay

## 2023-05-06 DIAGNOSIS — C50912 Malignant neoplasm of unspecified site of left female breast: Secondary | ICD-10-CM

## 2023-05-06 DIAGNOSIS — Z170 Estrogen receptor positive status [ER+]: Secondary | ICD-10-CM

## 2023-05-10 ENCOUNTER — Other Ambulatory Visit: Payer: Self-pay

## 2023-05-10 ENCOUNTER — Encounter: Payer: Self-pay | Admitting: Hematology

## 2023-05-10 ENCOUNTER — Encounter (HOSPITAL_COMMUNITY): Payer: Self-pay | Admitting: Hematology

## 2023-05-12 ENCOUNTER — Inpatient Hospital Stay: Payer: Managed Care, Other (non HMO)

## 2023-05-12 ENCOUNTER — Other Ambulatory Visit: Payer: Self-pay | Admitting: Physician Assistant

## 2023-05-12 VITALS — BP 119/65 | HR 63 | Temp 97.8°F | Resp 18

## 2023-05-12 DIAGNOSIS — C50912 Malignant neoplasm of unspecified site of left female breast: Secondary | ICD-10-CM

## 2023-05-12 DIAGNOSIS — Z95828 Presence of other vascular implants and grafts: Secondary | ICD-10-CM

## 2023-05-12 DIAGNOSIS — C50412 Malignant neoplasm of upper-outer quadrant of left female breast: Secondary | ICD-10-CM | POA: Diagnosis not present

## 2023-05-12 DIAGNOSIS — C50812 Malignant neoplasm of overlapping sites of left female breast: Secondary | ICD-10-CM

## 2023-05-12 DIAGNOSIS — F419 Anxiety disorder, unspecified: Secondary | ICD-10-CM

## 2023-05-12 LAB — COMPREHENSIVE METABOLIC PANEL
ALT: 13 U/L (ref 0–44)
AST: 12 U/L — ABNORMAL LOW (ref 15–41)
Albumin: 3.6 g/dL (ref 3.5–5.0)
Alkaline Phosphatase: 130 U/L — ABNORMAL HIGH (ref 38–126)
Anion gap: 10 (ref 5–15)
BUN: 29 mg/dL — ABNORMAL HIGH (ref 8–23)
CO2: 23 mmol/L (ref 22–32)
Calcium: 9.7 mg/dL (ref 8.9–10.3)
Chloride: 103 mmol/L (ref 98–111)
Creatinine, Ser: 1.5 mg/dL — ABNORMAL HIGH (ref 0.44–1.00)
GFR, Estimated: 38 mL/min — ABNORMAL LOW (ref >60)
Glucose, Bld: 121 mg/dL — ABNORMAL HIGH (ref 70–99)
Potassium: 3.7 mmol/L (ref 3.5–5.1)
Sodium: 136 mmol/L (ref 135–145)
Total Bilirubin: 0.6 mg/dL (ref 0.3–1.2)
Total Protein: 7 g/dL (ref 6.5–8.1)

## 2023-05-12 LAB — MAGNESIUM: Magnesium: 1.8 mg/dL (ref 1.7–2.4)

## 2023-05-12 MED ORDER — TRASTUZUMAB-ANNS CHEMO 150 MG IV SOLR
6.0000 mg/kg | Freq: Once | INTRAVENOUS | Status: AC
  Administered 2023-05-12: 420 mg via INTRAVENOUS
  Filled 2023-05-12: qty 20

## 2023-05-12 MED ORDER — CETIRIZINE HCL 10 MG PO TABS
10.0000 mg | ORAL_TABLET | Freq: Once | ORAL | Status: AC
  Administered 2023-05-12: 10 mg via ORAL
  Filled 2023-05-12: qty 1

## 2023-05-12 MED ORDER — ACETAMINOPHEN 325 MG PO TABS
650.0000 mg | ORAL_TABLET | Freq: Once | ORAL | Status: AC
  Administered 2023-05-12: 650 mg via ORAL
  Filled 2023-05-12: qty 2

## 2023-05-12 MED ORDER — HEPARIN SOD (PORK) LOCK FLUSH 100 UNIT/ML IV SOLN
500.0000 [IU] | Freq: Once | INTRAVENOUS | Status: AC | PRN
  Administered 2023-05-12: 500 [IU]

## 2023-05-12 MED ORDER — SODIUM CHLORIDE 0.9% FLUSH
10.0000 mL | INTRAVENOUS | Status: DC | PRN
  Administered 2023-05-12: 10 mL

## 2023-05-12 MED ORDER — SODIUM CHLORIDE 0.9% FLUSH
10.0000 mL | INTRAVENOUS | Status: DC | PRN
  Administered 2023-05-12: 10 mL via INTRAVENOUS

## 2023-05-12 MED ORDER — SODIUM CHLORIDE 0.9 % IV SOLN: Freq: Once | INTRAVENOUS | Status: AC

## 2023-05-12 MED ORDER — SODIUM CHLORIDE 0.9 % IV SOLN
420.0000 mg | Freq: Once | INTRAVENOUS | Status: AC
  Administered 2023-05-12: 420 mg via INTRAVENOUS
  Filled 2023-05-12: qty 14

## 2023-05-12 NOTE — Patient Instructions (Signed)
[facility]  Discharge Instructions: Thank you for choosing [facility] to provide your oncology and hematology care.  If you have a lab appointment with the Cancer Center - please note that after April 8th, 2024, all labs will be drawn in the cancer center.  You do not have to check in or register with the main entrance as you have in the past but will complete your check-in in the cancer center.  Wear comfortable clothing and clothing appropriate for easy access to any Portacath or PICC line.   We strive to give you quality time with your provider. You may need to reschedule your appointment if you arrive late (15 or more minutes).  Arriving late affects you and other patients whose appointments are after yours.  Also, if you miss three or more appointments without notifying the office, you may be dismissed from the clinic at the provider's discretion.      For prescription refill requests, have your pharmacy contact our office and allow 72 hours for refills to be completed.    Today you received the following chemotherapy and/or immunotherapy agents Kanjinti and Perjeta infusion.  Trastuzumab Injection What is this medication? TRASTUZUMAB (tras TOO zoo mab) treats breast cancer and stomach cancer. It works by blocking a protein that causes cancer cells to grow and multiply. This helps to slow or stop the spread of cancer cells. This medicine may be used for other purposes; ask your health care provider or pharmacist if you have questions. COMMON BRAND NAME(S): Herceptin, Marlowe Alt, Ontruzant, Trazimera What should I tell my care team before I take this medication? They need to know if you have any of these conditions: Heart failure Lung disease An unusual or allergic reaction to trastuzumab, other medications, foods, dyes, or preservatives Pregnant or trying to get pregnant Breast-feeding How should I use this medication? This medication  is injected into a vein. It is given by your care team in a hospital or clinic setting. Talk to your care team about the use of this medication in children. It is not approved for use in children. Overdosage: If you think you have taken too much of this medicine contact a poison control center or emergency room at once. NOTE: This medicine is only for you. Do not share this medicine with others. What if I miss a dose? Keep appointments for follow-up doses. It is important not to miss your dose. Call your care team if you are unable to keep an appointment. What may interact with this medication? Certain types of chemotherapy, such as daunorubicin, doxorubicin, epirubicin, idarubicin This list may not describe all possible interactions. Give your health care provider a list of all the medicines, herbs, non-prescription drugs, or dietary supplements you use. Also tell them if you smoke, drink alcohol, or use illegal drugs. Some items may interact with your medicine. What should I watch for while using this medication? Your condition will be monitored carefully while you are receiving this medication. This medication may make you feel generally unwell. This is not uncommon, as chemotherapy affects healthy cells as well as cancer cells. Report any side effects. Continue your course of treatment even though you feel ill unless your care team tells you to stop. This medication may increase your risk of getting an infection. Call your care team for advice if you get a fever, chills, sore throat, or other symptoms of a cold or flu. Do not treat yourself. Try to avoid being  around people who are sick. Avoid taking medications that contain aspirin, acetaminophen, ibuprofen, naproxen, or ketoprofen unless instructed by your care team. These medications can hide a fever. Talk to your care team if you may be pregnant. Serious birth defects can occur if you take this medication during pregnancy and for 7 months after  the last dose. You will need a negative pregnancy test before starting this medication. Contraception is recommended while taking this medication and for 7 months after the last dose. Your care team can help you find the option that works for you. Do not breastfeed while taking this medication and for 7 months after stopping treatment. What side effects may I notice from receiving this medication? Side effects that you should report to your care team as soon as possible: Allergic reactions or angioedema--skin rash, itching or hives, swelling of the face, eyes, lips, tongue, arms, or legs, trouble swallowing or breathing Dry cough, shortness of breath or trouble breathing Heart failure--shortness of breath, swelling of the ankles, feet, or hands, sudden weight gain, unusual weakness or fatigue Infection--fever, chills, cough, or sore throat Infusion reactions--chest pain, shortness of breath or trouble breathing, feeling faint or lightheaded Side effects that usually do not require medical attention (report to your care team if they continue or are bothersome): Diarrhea Dizziness Headache Nausea Trouble sleeping Vomiting This list may not describe all possible side effects. Call your doctor for medical advice about side effects. You may report side effects to FDA at 1-800-FDA-1088. Where should I keep my medication? This medication is given in a hospital or clinic. It will not be stored at home. NOTE: This sheet is a summary. It may not cover all possible information. If you have questions about this medicine, talk to your doctor, pharmacist, or health care provider.  2024 Elsevier/Gold Standard (2021-12-22 00:00:00)   Pertuzumab Injection What is this medication? PERTUZUMAB (per TOOZ ue mab) treats breast cancer. It works by blocking a protein that causes cancer cells to grow and multiply. This helps to slow or stop the spread of cancer cells. It is a monoclonal antibody. This medicine may  be used for other purposes; ask your health care provider or pharmacist if you have questions. COMMON BRAND NAME(S): PERJETA What should I tell my care team before I take this medication? They need to know if you have any of these conditions: Heart failure An unusual or allergic reaction to pertuzumab, other medications, foods, dyes, or preservatives Pregnant or trying to get pregnant Breast-feeding How should I use this medication? This medication is injected into a vein. It is given by your care team in a hospital or clinic setting. Talk to your care team about the use of this medication in children. Special care may be needed. Overdosage: If you think you have taken too much of this medicine contact a poison control center or emergency room at once. NOTE: This medicine is only for you. Do not share this medicine with others. What if I miss a dose? Keep appointments for follow-up doses. It is important not to miss your dose. Call your care team if you are unable to keep an appointment. What may interact with this medication? Interactions are not expected. This list may not describe all possible interactions. Give your health care provider a list of all the medicines, herbs, non-prescription drugs, or dietary supplements you use. Also tell them if you smoke, drink alcohol, or use illegal drugs. Some items may interact with your medicine. What should I watch  for while using this medication? Your condition will be monitored carefully while you are receiving this medication. This medication may make you feel generally unwell. This is not uncommon as chemotherapy can affect healthy cells as well as cancer cells. Report any side effects. Continue your course of treatment even though you feel ill unless your care team tells you to stop. Talk to your care team if you may be pregnant. Serious birth defects can occur if you take this medication during pregnancy and for 7 months after the last dose. You  will need a negative pregnancy test before starting this medication. Contraception is recommended while taking this medication and for 7 months after the last dose. Your care team can help you find the option that works for you. Do not breastfeed while taking this medication and for 7 months after the last dose. What side effects may I notice from receiving this medication? Side effects that you should report to your care team as soon as possible: Allergic reactions or angioedema--skin rash, itching or hives, swelling of the face, eyes, lips, tongue, arms, or legs, trouble swallowing or breathing Heart failure--shortness of breath, swelling of the ankles, feet, or hands, sudden weight gain, unusual weakness or fatigue Infusion reactions--chest pain, shortness of breath or trouble breathing, feeling faint or lightheaded Side effects that usually do not require medical attention (report to your care team if they continue or are bothersome): Diarrhea Dry skin Fatigue Hair loss Nausea Vomiting This list may not describe all possible side effects. Call your doctor for medical advice about side effects. You may report side effects to FDA at 1-800-FDA-1088. Where should I keep my medication? This medication is given in a hospital or clinic. It will not be stored at home. NOTE: This sheet is a summary. It may not cover all possible information. If you have questions about this medicine, talk to your doctor, pharmacist, or health care provider.  2024 Elsevier/Gold Standard (2021-12-22 00:00:00)      To help prevent nausea and vomiting after your treatment, we encourage you to take your nausea medication as directed.  BELOW ARE SYMPTOMS THAT SHOULD BE REPORTED IMMEDIATELY: *FEVER GREATER THAN 100.4 F (38 C) OR HIGHER *CHILLS OR SWEATING *NAUSEA AND VOMITING THAT IS NOT CONTROLLED WITH YOUR NAUSEA MEDICATION *UNUSUAL SHORTNESS OF BREATH *UNUSUAL BRUISING OR BLEEDING *URINARY PROBLEMS (pain or  burning when urinating, or frequent urination) *BOWEL PROBLEMS (unusual diarrhea, constipation, pain near the anus) TENDERNESS IN MOUTH AND THROAT WITH OR WITHOUT PRESENCE OF ULCERS (sore throat, sores in mouth, or a toothache) UNUSUAL RASH, SWELLING OR PAIN  UNUSUAL VAGINAL DISCHARGE OR ITCHING   Items with * indicate a potential emergency and should be followed up as soon as possible or go to the Emergency Department if any problems should occur.  Please show the CHEMOTHERAPY ALERT CARD or IMMUNOTHERAPY ALERT CARD at check-in to the Emergency Department and triage nurse.  Should you have questions after your visit or need to cancel or reschedule your appointment, please contact [facility] AT [facility] [phone]  and follow the prompts.  Office hours are 8:00 a.m. to 4:30 p.m. Monday - Friday. Please note that voicemails left after 4:00 p.m. may not be returned until the following business day.  We are closed weekends and major holidays. You have access to a nurse at all times for urgent questions. Please call the main number to the clinic [phone] and follow the prompts.  For any non-urgent questions, you may also contact your  provider using MyChart. We now offer e-Visits for anyone 1 and older to request care online for non-urgent symptoms. For details visit [email or web address].   Also download the MyChart app! Go to the app store, search "MyChart", open the app, select [place], and log in with your MyChart username and password.

## 2023-05-12 NOTE — Progress Notes (Signed)
Patients port flushed without difficulty.  Good blood return noted with no bruising or swelling noted at site.  VSS. Port remained accessed for treatment.

## 2023-05-12 NOTE — Progress Notes (Signed)
Patient presents today for chemotherapy/immunotherapy infusion of Kanjinti and Perjeta.  Patient is in satisfactory condition with no new complaints voiced.  Vital signs are stable.  VS within treatment parameters.  We will proceed with treatment per MD orders.   Patient tolerated treatment well with no complaints voiced.  Patient left ambulatory in stable condition.  Vital signs stable at discharge.  Follow up as scheduled.

## 2023-05-15 ENCOUNTER — Other Ambulatory Visit: Payer: Self-pay | Admitting: Hematology

## 2023-05-16 ENCOUNTER — Encounter: Payer: Self-pay | Admitting: Hematology

## 2023-05-16 ENCOUNTER — Encounter (HOSPITAL_COMMUNITY): Payer: Self-pay | Admitting: Hematology

## 2023-05-26 ENCOUNTER — Encounter: Payer: Self-pay | Admitting: Hematology

## 2023-05-26 ENCOUNTER — Encounter (HOSPITAL_COMMUNITY): Payer: Self-pay | Admitting: Hematology

## 2023-05-29 ENCOUNTER — Other Ambulatory Visit: Payer: Self-pay | Admitting: Hematology

## 2023-05-29 DIAGNOSIS — I82412 Acute embolism and thrombosis of left femoral vein: Secondary | ICD-10-CM

## 2023-05-30 ENCOUNTER — Encounter (HOSPITAL_COMMUNITY): Payer: Self-pay | Admitting: Hematology

## 2023-05-30 ENCOUNTER — Encounter: Payer: Self-pay | Admitting: Hematology

## 2023-06-01 ENCOUNTER — Encounter: Payer: Self-pay | Admitting: Hematology

## 2023-06-01 ENCOUNTER — Inpatient Hospital Stay: Payer: Managed Care, Other (non HMO) | Admitting: Hematology

## 2023-06-01 ENCOUNTER — Encounter (HOSPITAL_COMMUNITY): Payer: Self-pay | Admitting: Hematology

## 2023-06-01 ENCOUNTER — Inpatient Hospital Stay: Payer: Managed Care, Other (non HMO)

## 2023-06-02 ENCOUNTER — Inpatient Hospital Stay: Payer: Managed Care, Other (non HMO)

## 2023-06-02 ENCOUNTER — Inpatient Hospital Stay: Payer: Managed Care, Other (non HMO) | Attending: Oncology

## 2023-06-02 VITALS — BP 154/81 | HR 58 | Resp 20

## 2023-06-02 DIAGNOSIS — C50212 Malignant neoplasm of upper-inner quadrant of left female breast: Secondary | ICD-10-CM | POA: Diagnosis present

## 2023-06-02 DIAGNOSIS — Z170 Estrogen receptor positive status [ER+]: Secondary | ICD-10-CM | POA: Diagnosis not present

## 2023-06-02 DIAGNOSIS — Z5112 Encounter for antineoplastic immunotherapy: Secondary | ICD-10-CM | POA: Diagnosis present

## 2023-06-02 DIAGNOSIS — C50412 Malignant neoplasm of upper-outer quadrant of left female breast: Secondary | ICD-10-CM | POA: Diagnosis present

## 2023-06-02 DIAGNOSIS — C50912 Malignant neoplasm of unspecified site of left female breast: Secondary | ICD-10-CM

## 2023-06-02 DIAGNOSIS — Z79811 Long term (current) use of aromatase inhibitors: Secondary | ICD-10-CM | POA: Diagnosis not present

## 2023-06-02 DIAGNOSIS — Z95828 Presence of other vascular implants and grafts: Secondary | ICD-10-CM

## 2023-06-02 LAB — CBC WITH DIFFERENTIAL/PLATELET
Abs Immature Granulocytes: 0.02 10*3/uL (ref 0.00–0.07)
Basophils Absolute: 0.1 10*3/uL (ref 0.0–0.1)
Basophils Relative: 1 %
Eosinophils Absolute: 0.2 10*3/uL (ref 0.0–0.5)
Eosinophils Relative: 4 %
HCT: 36.9 % (ref 36.0–46.0)
Hemoglobin: 11.7 g/dL — ABNORMAL LOW (ref 12.0–15.0)
Immature Granulocytes: 0 %
Lymphocytes Relative: 15 %
Lymphs Abs: 0.8 10*3/uL (ref 0.7–4.0)
MCH: 30.3 pg (ref 26.0–34.0)
MCHC: 31.7 g/dL (ref 30.0–36.0)
MCV: 95.6 fL (ref 80.0–100.0)
Monocytes Absolute: 0.4 10*3/uL (ref 0.1–1.0)
Monocytes Relative: 7 %
Neutro Abs: 3.8 10*3/uL (ref 1.7–7.7)
Neutrophils Relative %: 73 %
Platelets: 183 10*3/uL (ref 150–400)
RBC: 3.86 MIL/uL — ABNORMAL LOW (ref 3.87–5.11)
RDW: 15.1 % (ref 11.5–15.5)
WBC: 5.3 10*3/uL (ref 4.0–10.5)
nRBC: 0 % (ref 0.0–0.2)

## 2023-06-02 LAB — COMPREHENSIVE METABOLIC PANEL
ALT: 14 U/L (ref 0–44)
AST: 13 U/L — ABNORMAL LOW (ref 15–41)
Albumin: 3.7 g/dL (ref 3.5–5.0)
Alkaline Phosphatase: 109 U/L (ref 38–126)
Anion gap: 6 (ref 5–15)
BUN: 22 mg/dL (ref 8–23)
CO2: 26 mmol/L (ref 22–32)
Calcium: 9.8 mg/dL (ref 8.9–10.3)
Chloride: 103 mmol/L (ref 98–111)
Creatinine, Ser: 1.39 mg/dL — ABNORMAL HIGH (ref 0.44–1.00)
GFR, Estimated: 42 mL/min — ABNORMAL LOW (ref >60)
Glucose, Bld: 94 mg/dL (ref 70–99)
Potassium: 4.3 mmol/L (ref 3.5–5.1)
Sodium: 135 mmol/L (ref 135–145)
Total Bilirubin: 0.6 mg/dL (ref 0.3–1.2)
Total Protein: 6.7 g/dL (ref 6.5–8.1)

## 2023-06-02 LAB — MAGNESIUM: Magnesium: 1.8 mg/dL (ref 1.7–2.4)

## 2023-06-02 MED ORDER — TRASTUZUMAB-ANNS CHEMO 150 MG IV SOLR
6.0000 mg/kg | Freq: Once | INTRAVENOUS | Status: AC
  Administered 2023-06-02: 420 mg via INTRAVENOUS
  Filled 2023-06-02: qty 20

## 2023-06-02 MED ORDER — HEPARIN SOD (PORK) LOCK FLUSH 100 UNIT/ML IV SOLN: 500.0000 [IU] | Freq: Once | INTRAVENOUS | Status: DC | PRN

## 2023-06-02 MED ORDER — ACETAMINOPHEN 325 MG PO TABS: 650.0000 mg | ORAL_TABLET | Freq: Once | ORAL | Status: DC

## 2023-06-02 MED ORDER — SODIUM CHLORIDE 0.9% FLUSH
10.0000 mL | Freq: Once | INTRAVENOUS | Status: AC
  Administered 2023-06-02: 10 mL via INTRAVENOUS

## 2023-06-02 MED ORDER — CETIRIZINE HCL 10 MG PO TABS: 10.0000 mg | ORAL_TABLET | Freq: Once | ORAL | Status: DC

## 2023-06-02 MED ORDER — SODIUM CHLORIDE 0.9 % IV SOLN: Freq: Once | INTRAVENOUS | Status: AC

## 2023-06-02 MED ORDER — HEPARIN SOD (PORK) LOCK FLUSH 100 UNIT/ML IV SOLN
500.0000 [IU] | Freq: Once | INTRAVENOUS | Status: AC
  Administered 2023-06-02: 500 [IU] via INTRAVENOUS

## 2023-06-02 MED ORDER — SODIUM CHLORIDE 0.9 % IV SOLN
420.0000 mg | Freq: Once | INTRAVENOUS | Status: AC
  Administered 2023-06-02: 420 mg via INTRAVENOUS
  Filled 2023-06-02: qty 14

## 2023-06-02 NOTE — Patient Instructions (Signed)
[facility]  Discharge Instructions: Thank you for choosing [facility] to provide your oncology and hematology care.  If you have a lab appointment with the Cancer Center - please note that after April 8th, 2024, all labs will be drawn in the cancer center.  You do not have to check in or register with the main entrance as you have in the past but will complete your check-in in the cancer center.  Wear comfortable clothing and clothing appropriate for easy access to any Portacath or PICC line.   We strive to give you quality time with your provider. You may need to reschedule your appointment if you arrive late (15 or more minutes).  Arriving late affects you and other patients whose appointments are after yours.  Also, if you miss three or more appointments without notifying the office, you may be dismissed from the clinic at the provider's discretion.      For prescription refill requests, have your pharmacy contact our office and allow 72 hours for refills to be completed.    Today you received the following chemotherapy and/or immunotherapy agents kanjinit & perjeta    To help prevent nausea and vomiting after your treatment, we encourage you to take your nausea medication as directed.  BELOW ARE SYMPTOMS THAT SHOULD BE REPORTED IMMEDIATELY: *FEVER GREATER THAN 100.4 F (38 C) OR HIGHER *CHILLS OR SWEATING *NAUSEA AND VOMITING THAT IS NOT CONTROLLED WITH YOUR NAUSEA MEDICATION *UNUSUAL SHORTNESS OF BREATH *UNUSUAL BRUISING OR BLEEDING *URINARY PROBLEMS (pain or burning when urinating, or frequent urination) *BOWEL PROBLEMS (unusual diarrhea, constipation, pain near the anus) TENDERNESS IN MOUTH AND THROAT WITH OR WITHOUT PRESENCE OF ULCERS (sore throat, sores in mouth, or a toothache) UNUSUAL RASH, SWELLING OR PAIN  UNUSUAL VAGINAL DISCHARGE OR ITCHING   Items with * indicate a potential emergency and should be followed up as soon as possible or go  to the Emergency Department if any problems should occur.  Please show the CHEMOTHERAPY ALERT CARD or IMMUNOTHERAPY ALERT CARD at check-in to the Emergency Department and triage nurse.  Should you have questions after your visit or need to cancel or reschedule your appointment, please contact [facility] CENTER AT [facility] [phone]  and follow the prompts.  Office hours are 8:00 a.m. to 4:30 p.m. Monday - Friday. Please note that voicemails left after 4:00 p.m. may not be returned until the following business day.  We are closed weekends and major holidays. You have access to a nurse at all times for urgent questions. Please call the main number to the clinic [phone] and follow the prompts.  For any non-urgent questions, you may also contact your provider using MyChart. We now offer e-Visits for anyone 56 and older to request care online for non-urgent symptoms. For details visit [email or web address].   Also download the MyChart app! Go to the app store, search "MyChart", open the app, select [place], and log in with your MyChart username and password.

## 2023-06-02 NOTE — Progress Notes (Signed)
Pt tolerated treatment well with no signs of complications. Pt ambulatory and in stable condition at discharge and all questions answered at this time. vitals stable. Follow ups as scheduled.  Cathren Sween [employer]

## 2023-06-07 ENCOUNTER — Other Ambulatory Visit: Payer: Self-pay

## 2023-06-20 ENCOUNTER — Other Ambulatory Visit: Payer: Self-pay

## 2023-06-22 ENCOUNTER — Encounter (HOSPITAL_COMMUNITY): Payer: Self-pay | Admitting: Hematology

## 2023-06-22 ENCOUNTER — Encounter: Payer: Self-pay | Admitting: Hematology

## 2023-06-23 ENCOUNTER — Inpatient Hospital Stay: Payer: Managed Care, Other (non HMO)

## 2023-06-23 ENCOUNTER — Encounter: Payer: Self-pay | Admitting: Hematology

## 2023-06-23 ENCOUNTER — Encounter (HOSPITAL_COMMUNITY): Payer: Self-pay | Admitting: Hematology

## 2023-06-23 ENCOUNTER — Inpatient Hospital Stay: Payer: Managed Care, Other (non HMO) | Admitting: Hematology

## 2023-06-23 VITALS — BP 116/69 | HR 57 | Temp 96.6°F | Resp 16

## 2023-06-23 DIAGNOSIS — Z95828 Presence of other vascular implants and grafts: Secondary | ICD-10-CM

## 2023-06-23 DIAGNOSIS — C50812 Malignant neoplasm of overlapping sites of left female breast: Secondary | ICD-10-CM

## 2023-06-23 DIAGNOSIS — C50412 Malignant neoplasm of upper-outer quadrant of left female breast: Secondary | ICD-10-CM | POA: Diagnosis not present

## 2023-06-23 DIAGNOSIS — C50912 Malignant neoplasm of unspecified site of left female breast: Secondary | ICD-10-CM

## 2023-06-23 LAB — CBC WITH DIFFERENTIAL/PLATELET
Abs Immature Granulocytes: 0.03 10*3/uL (ref 0.00–0.07)
Basophils Absolute: 0.1 10*3/uL (ref 0.0–0.1)
Basophils Relative: 1 %
Eosinophils Absolute: 0.3 10*3/uL (ref 0.0–0.5)
Eosinophils Relative: 5 %
HCT: 36.3 % (ref 36.0–46.0)
Hemoglobin: 11.3 g/dL — ABNORMAL LOW (ref 12.0–15.0)
Immature Granulocytes: 1 %
Lymphocytes Relative: 16 %
Lymphs Abs: 0.8 10*3/uL (ref 0.7–4.0)
MCH: 29.7 pg (ref 26.0–34.0)
MCHC: 31.1 g/dL (ref 30.0–36.0)
MCV: 95.3 fL (ref 80.0–100.0)
Monocytes Absolute: 0.4 10*3/uL (ref 0.1–1.0)
Monocytes Relative: 8 %
Neutro Abs: 3.6 10*3/uL (ref 1.7–7.7)
Neutrophils Relative %: 69 %
Platelets: 189 10*3/uL (ref 150–400)
RBC: 3.81 MIL/uL — ABNORMAL LOW (ref 3.87–5.11)
RDW: 15.4 % (ref 11.5–15.5)
WBC: 5.2 10*3/uL (ref 4.0–10.5)
nRBC: 0 % (ref 0.0–0.2)

## 2023-06-23 LAB — MAGNESIUM: Magnesium: 1.8 mg/dL (ref 1.7–2.4)

## 2023-06-23 LAB — COMPREHENSIVE METABOLIC PANEL
ALT: 15 U/L (ref 0–44)
AST: 14 U/L — ABNORMAL LOW (ref 15–41)
Albumin: 3.7 g/dL (ref 3.5–5.0)
Alkaline Phosphatase: 104 U/L (ref 38–126)
Anion gap: 9 (ref 5–15)
BUN: 32 mg/dL — ABNORMAL HIGH (ref 8–23)
CO2: 25 mmol/L (ref 22–32)
Calcium: 10 mg/dL (ref 8.9–10.3)
Chloride: 102 mmol/L (ref 98–111)
Creatinine, Ser: 1.55 mg/dL — ABNORMAL HIGH (ref 0.44–1.00)
GFR, Estimated: 37 mL/min — ABNORMAL LOW (ref >60)
Glucose, Bld: 99 mg/dL (ref 70–99)
Potassium: 4.1 mmol/L (ref 3.5–5.1)
Sodium: 136 mmol/L (ref 135–145)
Total Bilirubin: 0.5 mg/dL (ref 0.3–1.2)
Total Protein: 6.8 g/dL (ref 6.5–8.1)

## 2023-06-23 MED ORDER — CETIRIZINE HCL 10 MG PO TABS
10.0000 mg | ORAL_TABLET | Freq: Once | ORAL | Status: AC
  Administered 2023-06-23: 10 mg via ORAL
  Filled 2023-06-23: qty 1

## 2023-06-23 MED ORDER — SODIUM CHLORIDE 0.9% FLUSH
10.0000 mL | INTRAVENOUS | Status: DC | PRN
  Administered 2023-06-23: 10 mL

## 2023-06-23 MED ORDER — SODIUM CHLORIDE 0.9 % IV SOLN
420.0000 mg | Freq: Once | INTRAVENOUS | Status: AC
  Administered 2023-06-23: 420 mg via INTRAVENOUS
  Filled 2023-06-23: qty 14

## 2023-06-23 MED ORDER — ACETAMINOPHEN 325 MG PO TABS
650.0000 mg | ORAL_TABLET | Freq: Once | ORAL | Status: AC
  Administered 2023-06-23: 650 mg via ORAL
  Filled 2023-06-23: qty 2

## 2023-06-23 MED ORDER — SODIUM CHLORIDE 0.9 % IV SOLN: Freq: Once | INTRAVENOUS | Status: AC

## 2023-06-23 MED ORDER — SODIUM CHLORIDE 0.9 % IV SOLN
6.0000 mg/kg | Freq: Once | INTRAVENOUS | Status: AC
  Administered 2023-06-23: 420 mg via INTRAVENOUS
  Filled 2023-06-23: qty 20

## 2023-06-23 MED ORDER — SODIUM CHLORIDE 0.9% FLUSH
10.0000 mL | INTRAVENOUS | Status: DC | PRN
  Administered 2023-06-23: 10 mL via INTRAVENOUS

## 2023-06-23 MED ORDER — HEPARIN SOD (PORK) LOCK FLUSH 100 UNIT/ML IV SOLN
500.0000 [IU] | Freq: Once | INTRAVENOUS | Status: AC | PRN
Start: 2023-06-23 — End: 2023-06-23
  Administered 2023-06-23: 500 [IU]

## 2023-06-23 NOTE — Progress Notes (Signed)
Patient presents today for Kanjinti/Perjeta treatment. Vital signs within parameters for treatment. Labs pending. Patient has complaints of itching at her perineum and finished antibiotics for a UTI per patient's words. Message sent to Dr. Ellin Saba.  Creatinine 1.55. Message received from A.Dareen Piano RN / Dr. Ellin Saba proceed with treatment. Infuse 500 mls of normal saline over an hour with treatment. Patient may use an over the counter medication for yeast infection.   Treatment given today per MD orders. Tolerated infusion without adverse affects. Vital signs stable. No complaints at this time. Discharged from clinic ambulatory in stable condition. Alert and oriented x 3. F/U with [facility] as scheduled.

## 2023-06-23 NOTE — Patient Instructions (Signed)
[facility]  Discharge Instructions: Thank you for choosing [facility] to provide your oncology and hematology care.  If you have a lab appointment with the Cancer Center - please note that after April 8th, 2024, all labs will be drawn in the cancer center.  You do not have to check in or register with the main entrance as you have in the past but will complete your check-in in the cancer center.  Wear comfortable clothing and clothing appropriate for easy access to any Portacath or PICC line.   We strive to give you quality time with your provider. You may need to reschedule your appointment if you arrive late (15 or more minutes).  Arriving late affects you and other patients whose appointments are after yours.  Also, if you miss three or more appointments without notifying the office, you may be dismissed from the clinic at the provider's discretion.      For prescription refill requests, have your pharmacy contact our office and allow 72 hours for refills to be completed.    Today you received the following chemotherapy and/or immunotherapy agents Kanjinti and Perjeta.  Pertuzumab Injection What is this medication? PERTUZUMAB (per TOOZ ue mab) treats breast cancer. It works by blocking a protein that causes cancer cells to grow and multiply. This helps to slow or stop the spread of cancer cells. It is a monoclonal antibody. This medicine may be used for other purposes; ask your health care provider or pharmacist if you have questions. COMMON BRAND NAME(S): PERJETA What should I tell my care team before I take this medication? They need to know if you have any of these conditions: Heart failure An unusual or allergic reaction to pertuzumab, other medications, foods, dyes, or preservatives Pregnant or trying to get pregnant Breast-feeding How should I use this medication? This medication is injected into a vein. It is given by your care team in a hospital  or clinic setting. Talk to your care team about the use of this medication in children. Special care may be needed. Overdosage: If you think you have taken too much of this medicine contact a poison control center or emergency room at once. NOTE: This medicine is only for you. Do not share this medicine with others. What if I miss a dose? Keep appointments for follow-up doses. It is important not to miss your dose. Call your care team if you are unable to keep an appointment. What may interact with this medication? Interactions are not expected. This list may not describe all possible interactions. Give your health care provider a list of all the medicines, herbs, non-prescription drugs, or dietary supplements you use. Also tell them if you smoke, drink alcohol, or use illegal drugs. Some items may interact with your medicine. What should I watch for while using this medication? Your condition will be monitored carefully while you are receiving this medication. This medication may make you feel generally unwell. This is not uncommon as chemotherapy can affect healthy cells as well as cancer cells. Report any side effects. Continue your course of treatment even though you feel ill unless your care team tells you to stop. Talk to your care team if you may be pregnant. Serious birth defects can occur if you take this medication during pregnancy and for 7 months after the last dose. You will need a negative pregnancy test before starting this medication. Contraception is recommended while taking this medication and for 7 months after the  last dose. Your care team can help you find the option that works for you. Do not breastfeed while taking this medication and for 7 months after the last dose. What side effects may I notice from receiving this medication? Side effects that you should report to your care team as soon as possible: Allergic reactions or angioedema--skin rash, itching or hives, swelling of  the face, eyes, lips, tongue, arms, or legs, trouble swallowing or breathing Heart failure--shortness of breath, swelling of the ankles, feet, or hands, sudden weight gain, unusual weakness or fatigue Infusion reactions--chest pain, shortness of breath or trouble breathing, feeling faint or lightheaded Side effects that usually do not require medical attention (report to your care team if they continue or are bothersome): Diarrhea Dry skin Fatigue Hair loss Nausea Vomiting This list may not describe all possible side effects. Call your doctor for medical advice about side effects. You may report side effects to FDA at 1-800-FDA-1088. Where should I keep my medication? This medication is given in a hospital or clinic. It will not be stored at home. NOTE: This sheet is a summary. It may not cover all possible information. If you have questions about this medicine, talk to your doctor, pharmacist, or health care provider.  2024 Elsevier/Gold Standard (2021-12-22 00:00:00) Trastuzumab Injection What is this medication? TRASTUZUMAB (tras TOO zoo mab) treats breast cancer and stomach cancer. It works by blocking a protein that causes cancer cells to grow and multiply. This helps to slow or stop the spread of cancer cells. This medicine may be used for other purposes; ask your health care provider or pharmacist if you have questions. COMMON BRAND NAME(S): Herceptin, Marlowe Alt, Ontruzant, Trazimera What should I tell my care team before I take this medication? They need to know if you have any of these conditions: Heart failure Lung disease An unusual or allergic reaction to trastuzumab, other medications, foods, dyes, or preservatives Pregnant or trying to get pregnant Breast-feeding How should I use this medication? This medication is injected into a vein. It is given by your care team in a hospital or clinic setting. Talk to your care team about the use of this medication in  children. It is not approved for use in children. Overdosage: If you think you have taken too much of this medicine contact a poison control center or emergency room at once. NOTE: This medicine is only for you. Do not share this medicine with others. What if I miss a dose? Keep appointments for follow-up doses. It is important not to miss your dose. Call your care team if you are unable to keep an appointment. What may interact with this medication? Certain types of chemotherapy, such as daunorubicin, doxorubicin, epirubicin, idarubicin This list may not describe all possible interactions. Give your health care provider a list of all the medicines, herbs, non-prescription drugs, or dietary supplements you use. Also tell them if you smoke, drink alcohol, or use illegal drugs. Some items may interact with your medicine. What should I watch for while using this medication? Your condition will be monitored carefully while you are receiving this medication. This medication may make you feel generally unwell. This is not uncommon, as chemotherapy affects healthy cells as well as cancer cells. Report any side effects. Continue your course of treatment even though you feel ill unless your care team tells you to stop. This medication may increase your risk of getting an infection. Call your care team for advice if you get a fever,  chills, sore throat, or other symptoms of a cold or flu. Do not treat yourself. Try to avoid being around people who are sick. Avoid taking medications that contain aspirin, acetaminophen, ibuprofen, naproxen, or ketoprofen unless instructed by your care team. These medications can hide a fever. Talk to your care team if you may be pregnant. Serious birth defects can occur if you take this medication during pregnancy and for 7 months after the last dose. You will need a negative pregnancy test before starting this medication. Contraception is recommended while taking this medication  and for 7 months after the last dose. Your care team can help you find the option that works for you. Do not breastfeed while taking this medication and for 7 months after stopping treatment. What side effects may I notice from receiving this medication? Side effects that you should report to your care team as soon as possible: Allergic reactions or angioedema--skin rash, itching or hives, swelling of the face, eyes, lips, tongue, arms, or legs, trouble swallowing or breathing Dry cough, shortness of breath or trouble breathing Heart failure--shortness of breath, swelling of the ankles, feet, or hands, sudden weight gain, unusual weakness or fatigue Infection--fever, chills, cough, or sore throat Infusion reactions--chest pain, shortness of breath or trouble breathing, feeling faint or lightheaded Side effects that usually do not require medical attention (report to your care team if they continue or are bothersome): Diarrhea Dizziness Headache Nausea Trouble sleeping Vomiting This list may not describe all possible side effects. Call your doctor for medical advice about side effects. You may report side effects to FDA at 1-800-FDA-1088. Where should I keep my medication? This medication is given in a hospital or clinic. It will not be stored at home. NOTE: This sheet is a summary. It may not cover all possible information. If you have questions about this medicine, talk to your doctor, pharmacist, or health care provider.  2024 Elsevier/Gold Standard (2021-12-22 00:00:00)       To help prevent nausea and vomiting after your treatment, we encourage you to take your nausea medication as directed.  BELOW ARE SYMPTOMS THAT SHOULD BE REPORTED IMMEDIATELY: *FEVER GREATER THAN 100.4 F (38 C) OR HIGHER *CHILLS OR SWEATING *NAUSEA AND VOMITING THAT IS NOT CONTROLLED WITH YOUR NAUSEA MEDICATION *UNUSUAL SHORTNESS OF BREATH *UNUSUAL BRUISING OR BLEEDING *URINARY PROBLEMS (pain or burning when  urinating, or frequent urination) *BOWEL PROBLEMS (unusual diarrhea, constipation, pain near the anus) TENDERNESS IN MOUTH AND THROAT WITH OR WITHOUT PRESENCE OF ULCERS (sore throat, sores in mouth, or a toothache) UNUSUAL RASH, SWELLING OR PAIN  UNUSUAL VAGINAL DISCHARGE OR ITCHING   Items with * indicate a potential emergency and should be followed up as soon as possible or go to the Emergency Department if any problems should occur.  Please show the CHEMOTHERAPY ALERT CARD or IMMUNOTHERAPY ALERT CARD at check-in to the Emergency Department and triage nurse.  Should you have questions after your visit or need to cancel or reschedule your appointment, please contact [facility]ENTER AT [facility] [phone]  and follow the prompts.  Office hours are 8:00 a.m. to 4:30 p.m. Monday - Friday. Please note that voicemails left after 4:00 p.m. may not be returned until the following business day.  We are closed weekends and major holidays. You have access to a nurse at all times for urgent questions. Please call the main number to the clinic [phone] and follow the prompts.  For any non-urgent questions, you may also contact your provider using  MyChart. We now offer e-Visits for anyone 3 and older to request care online for non-urgent symptoms. For details visit [email or web address].   Also download the MyChart app! Go to the app store, search "MyChart", open the app, select [place], and log in with your MyChart username and password.

## 2023-07-12 ENCOUNTER — Other Ambulatory Visit: Payer: Self-pay

## 2023-07-13 ENCOUNTER — Other Ambulatory Visit: Payer: Self-pay

## 2023-07-14 ENCOUNTER — Inpatient Hospital Stay: Payer: Managed Care, Other (non HMO) | Admitting: Hematology

## 2023-07-14 ENCOUNTER — Inpatient Hospital Stay: Payer: Managed Care, Other (non HMO) | Attending: Oncology

## 2023-07-14 ENCOUNTER — Inpatient Hospital Stay: Payer: Managed Care, Other (non HMO)

## 2023-07-14 VITALS — BP 145/76 | HR 59 | Temp 97.9°F | Resp 18

## 2023-07-14 DIAGNOSIS — Z5112 Encounter for antineoplastic immunotherapy: Secondary | ICD-10-CM | POA: Insufficient documentation

## 2023-07-14 DIAGNOSIS — C50212 Malignant neoplasm of upper-inner quadrant of left female breast: Secondary | ICD-10-CM | POA: Diagnosis present

## 2023-07-14 DIAGNOSIS — C50812 Malignant neoplasm of overlapping sites of left female breast: Secondary | ICD-10-CM

## 2023-07-14 DIAGNOSIS — Z95828 Presence of other vascular implants and grafts: Secondary | ICD-10-CM

## 2023-07-14 DIAGNOSIS — C50912 Malignant neoplasm of unspecified site of left female breast: Secondary | ICD-10-CM

## 2023-07-14 DIAGNOSIS — Z79811 Long term (current) use of aromatase inhibitors: Secondary | ICD-10-CM | POA: Insufficient documentation

## 2023-07-14 DIAGNOSIS — C50412 Malignant neoplasm of upper-outer quadrant of left female breast: Secondary | ICD-10-CM | POA: Insufficient documentation

## 2023-07-14 DIAGNOSIS — Z170 Estrogen receptor positive status [ER+]: Secondary | ICD-10-CM | POA: Insufficient documentation

## 2023-07-14 LAB — CBC WITH DIFFERENTIAL/PLATELET
Abs Immature Granulocytes: 0.04 10*3/uL (ref 0.00–0.07)
Basophils Absolute: 0 10*3/uL (ref 0.0–0.1)
Basophils Relative: 1 %
Eosinophils Absolute: 0.3 10*3/uL (ref 0.0–0.5)
Eosinophils Relative: 4 %
HCT: 36.6 % (ref 36.0–46.0)
Hemoglobin: 11.7 g/dL — ABNORMAL LOW (ref 12.0–15.0)
Immature Granulocytes: 1 %
Lymphocytes Relative: 12 %
Lymphs Abs: 0.9 10*3/uL (ref 0.7–4.0)
MCH: 30.3 pg (ref 26.0–34.0)
MCHC: 32 g/dL (ref 30.0–36.0)
MCV: 94.8 fL (ref 80.0–100.0)
Monocytes Absolute: 0.5 10*3/uL (ref 0.1–1.0)
Monocytes Relative: 6 %
Neutro Abs: 5.6 10*3/uL (ref 1.7–7.7)
Neutrophils Relative %: 76 %
Platelets: 196 10*3/uL (ref 150–400)
RBC: 3.86 MIL/uL — ABNORMAL LOW (ref 3.87–5.11)
RDW: 15.4 % (ref 11.5–15.5)
WBC: 7.2 10*3/uL (ref 4.0–10.5)
nRBC: 0 % (ref 0.0–0.2)

## 2023-07-14 LAB — COMPREHENSIVE METABOLIC PANEL
ALT: 14 U/L (ref 0–44)
AST: 14 U/L — ABNORMAL LOW (ref 15–41)
Albumin: 3.7 g/dL (ref 3.5–5.0)
Alkaline Phosphatase: 108 U/L (ref 38–126)
Anion gap: 9 (ref 5–15)
BUN: 28 mg/dL — ABNORMAL HIGH (ref 8–23)
CO2: 24 mmol/L (ref 22–32)
Calcium: 9.8 mg/dL (ref 8.9–10.3)
Chloride: 102 mmol/L (ref 98–111)
Creatinine, Ser: 1.73 mg/dL — ABNORMAL HIGH (ref 0.44–1.00)
GFR, Estimated: 32 mL/min — ABNORMAL LOW (ref >60)
Glucose, Bld: 89 mg/dL (ref 70–99)
Potassium: 4.1 mmol/L (ref 3.5–5.1)
Sodium: 135 mmol/L (ref 135–145)
Total Bilirubin: 0.6 mg/dL (ref <1.2)
Total Protein: 7 g/dL (ref 6.5–8.1)

## 2023-07-14 LAB — MAGNESIUM: Magnesium: 1.7 mg/dL (ref 1.7–2.4)

## 2023-07-14 MED ORDER — TRASTUZUMAB-ANNS CHEMO 420 MG IV SOLR
6.0000 mg/kg | Freq: Once | INTRAVENOUS | Status: AC
  Administered 2023-07-14: 420 mg via INTRAVENOUS
  Filled 2023-07-14: qty 20

## 2023-07-14 MED ORDER — SODIUM CHLORIDE 0.9% FLUSH
10.0000 mL | INTRAVENOUS | Status: DC | PRN
  Administered 2023-07-14: 10 mL

## 2023-07-14 MED ORDER — SODIUM CHLORIDE 0.9 % IV SOLN: Freq: Once | INTRAVENOUS | Status: AC

## 2023-07-14 MED ORDER — ACETAMINOPHEN 325 MG PO TABS
650.0000 mg | ORAL_TABLET | Freq: Once | ORAL | Status: AC
  Administered 2023-07-14: 650 mg via ORAL
  Filled 2023-07-14: qty 2

## 2023-07-14 MED ORDER — SODIUM CHLORIDE 0.9 % IV SOLN
420.0000 mg | Freq: Once | INTRAVENOUS | Status: AC
  Administered 2023-07-14: 420 mg via INTRAVENOUS
  Filled 2023-07-14: qty 14

## 2023-07-14 MED ORDER — CETIRIZINE HCL 10 MG PO TABS
10.0000 mg | ORAL_TABLET | Freq: Once | ORAL | Status: AC
  Administered 2023-07-14: 10 mg via ORAL
  Filled 2023-07-14: qty 1

## 2023-07-14 MED ORDER — HEPARIN SOD (PORK) LOCK FLUSH 100 UNIT/ML IV SOLN
500.0000 [IU] | Freq: Once | INTRAVENOUS | Status: AC | PRN
  Administered 2023-07-14: 500 [IU]

## 2023-07-14 NOTE — Patient Instructions (Signed)
[facility] - A DEPT OF [facility]  Discharge Instructions: Thank you for choosing [facility] to provide your oncology and hematology care.  If you have a lab appointment with the Cancer Center - please note that after April 8th, 2024, all labs will be drawn in the cancer center.  You do not have to check in or register with the main entrance as you have in the past but will complete your check-in in the cancer center.  Wear comfortable clothing and clothing appropriate for easy access to any Portacath or PICC line.   We strive to give you quality time with your provider. You may need to reschedule your appointment if you arrive late (15 or more minutes).  Arriving late affects you and other patients whose appointments are after yours.  Also, if you miss three or more appointments without notifying the office, you may be dismissed from the clinic at the provider's discretion.      For prescription refill requests, have your pharmacy contact our office and allow 72 hours for refills to be completed.    Today you received the following chemotherapy and/or immunotherapy agents Kanjinti/Perjeta   To help prevent nausea and vomiting after your treatment, we encourage you to take your nausea medication as directed.  Trastuzumab Injection What is this medication? TRASTUZUMAB (tras TOO zoo mab) treats breast cancer and stomach cancer. It works by blocking a protein that causes cancer cells to grow and multiply. This helps to slow or stop the spread of cancer cells. This medicine may be used for other purposes; ask your health care provider or pharmacist if you have questions. COMMON BRAND NAME(S): Herceptin, Marlowe Alt, Ontruzant, Trazimera What should I tell my care team before I take this medication? They need to know if you have any of these conditions: Heart failure Lung disease An unusual or allergic reaction to trastuzumab, other  medications, foods, dyes, or preservatives Pregnant or trying to get pregnant Breast-feeding How should I use this medication? This medication is injected into a vein. It is given by your care team in a hospital or clinic setting. Talk to your care team about the use of this medication in children. It is not approved for use in children. Overdosage: If you think you have taken too much of this medicine contact a poison control center or emergency room at once. NOTE: This medicine is only for you. Do not share this medicine with others. What if I miss a dose? Keep appointments for follow-up doses. It is important not to miss your dose. Call your care team if you are unable to keep an appointment. What may interact with this medication? Certain types of chemotherapy, such as daunorubicin, doxorubicin, epirubicin, idarubicin This list may not describe all possible interactions. Give your health care provider a list of all the medicines, herbs, non-prescription drugs, or dietary supplements you use. Also tell them if you smoke, drink alcohol, or use illegal drugs. Some items may interact with your medicine. What should I watch for while using this medication? Your condition will be monitored carefully while you are receiving this medication. This medication may make you feel generally unwell. This is not uncommon, as chemotherapy affects healthy cells as well as cancer cells. Report any side effects. Continue your course of treatment even though you feel ill unless your care team tells you to stop. This medication may increase your risk of getting an infection. Call your care team  for advice if you get a fever, chills, sore throat, or other symptoms of a cold or flu. Do not treat yourself. Try to avoid being around people who are sick. Avoid taking medications that contain aspirin, acetaminophen, ibuprofen, naproxen, or ketoprofen unless instructed by your care team. These medications can hide a  fever. Talk to your care team if you may be pregnant. Serious birth defects can occur if you take this medication during pregnancy and for 7 months after the last dose. You will need a negative pregnancy test before starting this medication. Contraception is recommended while taking this medication and for 7 months after the last dose. Your care team can help you find the option that works for you. Do not breastfeed while taking this medication and for 7 months after stopping treatment. What side effects may I notice from receiving this medication? Side effects that you should report to your care team as soon as possible: Allergic reactions or angioedema--skin rash, itching or hives, swelling of the face, eyes, lips, tongue, arms, or legs, trouble swallowing or breathing Dry cough, shortness of breath or trouble breathing Heart failure--shortness of breath, swelling of the ankles, feet, or hands, sudden weight gain, unusual weakness or fatigue Infection--fever, chills, cough, or sore throat Infusion reactions--chest pain, shortness of breath or trouble breathing, feeling faint or lightheaded Side effects that usually do not require medical attention (report to your care team if they continue or are bothersome): Diarrhea Dizziness Headache Nausea Trouble sleeping Vomiting This list may not describe all possible side effects. Call your doctor for medical advice about side effects. You may report side effects to FDA at 1-800-FDA-1088. Where should I keep my medication? This medication is given in a hospital or clinic. It will not be stored at home. NOTE: This sheet is a summary. It may not cover all possible information. If you have questions about this medicine, talk to your doctor, pharmacist, or health care provider.  2024 Elsevier/Gold Standard (2021-12-22 00:00:00)  Pertuzumab Injection What is this medication? PERTUZUMAB (per TOOZ ue mab) treats breast cancer. It works by blocking a  protein that causes cancer cells to grow and multiply. This helps to slow or stop the spread of cancer cells. It is a monoclonal antibody. This medicine may be used for other purposes; ask your health care provider or pharmacist if you have questions. COMMON BRAND NAME(S): PERJETA What should I tell my care team before I take this medication? They need to know if you have any of these conditions: Heart failure An unusual or allergic reaction to pertuzumab, other medications, foods, dyes, or preservatives Pregnant or trying to get pregnant Breast-feeding How should I use this medication? This medication is injected into a vein. It is given by your care team in a hospital or clinic setting. Talk to your care team about the use of this medication in children. Special care may be needed. Overdosage: If you think you have taken too much of this medicine contact a poison control center or emergency room at once. NOTE: This medicine is only for you. Do not share this medicine with others. What if I miss a dose? Keep appointments for follow-up doses. It is important not to miss your dose. Call your care team if you are unable to keep an appointment. What may interact with this medication? Interactions are not expected. This list may not describe all possible interactions. Give your health care provider a list of all the medicines, herbs, non-prescription drugs, or dietary supplements  you use. Also tell them if you smoke, drink alcohol, or use illegal drugs. Some items may interact with your medicine. What should I watch for while using this medication? Your condition will be monitored carefully while you are receiving this medication. This medication may make you feel generally unwell. This is not uncommon as chemotherapy can affect healthy cells as well as cancer cells. Report any side effects. Continue your course of treatment even though you feel ill unless your care team tells you to stop. Talk to  your care team if you may be pregnant. Serious birth defects can occur if you take this medication during pregnancy and for 7 months after the last dose. You will need a negative pregnancy test before starting this medication. Contraception is recommended while taking this medication and for 7 months after the last dose. Your care team can help you find the option that works for you. Do not breastfeed while taking this medication and for 7 months after the last dose. What side effects may I notice from receiving this medication? Side effects that you should report to your care team as soon as possible: Allergic reactions or angioedema--skin rash, itching or hives, swelling of the face, eyes, lips, tongue, arms, or legs, trouble swallowing or breathing Heart failure--shortness of breath, swelling of the ankles, feet, or hands, sudden weight gain, unusual weakness or fatigue Infusion reactions--chest pain, shortness of breath or trouble breathing, feeling faint or lightheaded Side effects that usually do not require medical attention (report to your care team if they continue or are bothersome): Diarrhea Dry skin Fatigue Hair loss Nausea Vomiting This list may not describe all possible side effects. Call your doctor for medical advice about side effects. You may report side effects to FDA at 1-800-FDA-1088. Where should I keep my medication? This medication is given in a hospital or clinic. It will not be stored at home. NOTE: This sheet is a summary. It may not cover all possible information. If you have questions about this medicine, talk to your doctor, pharmacist, or health care provider.  2024 Elsevier/Gold Standard (2021-12-22 00:00:00)    BELOW ARE SYMPTOMS THAT SHOULD BE REPORTED IMMEDIATELY: *FEVER GREATER THAN 100.4 F (38 C) OR HIGHER *CHILLS OR SWEATING *NAUSEA AND VOMITING THAT IS NOT CONTROLLED WITH YOUR NAUSEA MEDICATION *UNUSUAL SHORTNESS OF BREATH *UNUSUAL BRUISING OR  BLEEDING *URINARY PROBLEMS (pain or burning when urinating, or frequent urination) *BOWEL PROBLEMS (unusual diarrhea, constipation, pain near the anus) TENDERNESS IN MOUTH AND THROAT WITH OR WITHOUT PRESENCE OF ULCERS (sore throat, sores in mouth, or a toothache) UNUSUAL RASH, SWELLING OR PAIN  UNUSUAL VAGINAL DISCHARGE OR ITCHING   Items with * indicate a potential emergency and should be followed up as soon as possible or go to the Emergency Department if any problems should occur.  Please show the CHEMOTHERAPY ALERT CARD or IMMUNOTHERAPY ALERT CARD at check-in to the Emergency Department and triage nurse.  Should you have questions after your visit or need to cancel or reschedule your appointment, please contact [facility] - A DEPT OF [facility] [phone]  and follow the prompts.  Office hours are 8:00 a.m. to 4:30 p.m. Monday - Friday. Please note that voicemails left after 4:00 p.m. may not be returned until the following business day.  We are closed weekends and major holidays. You have access to a nurse at all times for urgent questions. Please call the main number to the clinic [phone] and follow the  prompts.  For any non-urgent questions, you may also contact your provider using MyChart. We now offer e-Visits for anyone 4 and older to request care online for non-urgent symptoms. For details visit [email or web address].   Also download the MyChart app! Go to the app store, search "MyChart", open the app, select [facility], and log in with your MyChart username and password.

## 2023-07-14 NOTE — Progress Notes (Signed)
Patient presents today for Kanjinti/Perjeta infusion.  Patient is in satisfactory condition with no new complaints voiced.  Vital signs are stable.  Labs reviewed and all other labs are within treatment parameters. Patient's Creatinine noted to be 1.73, Dr.Kandala/Carol Jones,RPH made aware. We will proceed with treatment per MD orders.    Treatment given today per MD orders. Tolerated infusion without adverse affects. Vital signs stable. No complaints at this time. Discharged from clinic ambulatory in stable condition. Alert and oriented x 3. F/U with [facility] as scheduled.

## 2023-07-15 LAB — CANCER ANTIGEN 27.29: CA 27.29: 30.6 U/mL (ref 0.0–38.6)

## 2023-07-15 LAB — CANCER ANTIGEN 15-3: CA 15-3: 26.6 U/mL — ABNORMAL HIGH (ref 0.0–25.0)

## 2023-07-28 ENCOUNTER — Encounter: Payer: Self-pay | Admitting: Hematology

## 2023-07-28 ENCOUNTER — Encounter (HOSPITAL_COMMUNITY): Payer: Self-pay | Admitting: Hematology

## 2023-08-03 NOTE — Progress Notes (Signed)
[facility] [address], [place] [address]    Clinic Day:  08/16/23   Referring physician: [name], MD  Patient Care Team: [name], MD as PCP - General (Internal Medicine) [name], MD as PCP - Cardiology (Cardiology) [name], MD as Medical Oncologist (Medical Oncology) [name], RN as Oncology Nurse Navigator (Medical Oncology)   ASSESSMENT & PLAN:   Assessment: 1. 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 - She noticed lump has gotten bigger and deformed since September 2022.  She has itching over the skin of the breast. - Left breast 4:00 biopsy: Invasive mammary carcinoma, grade 2, ER 100%, PR 70%, Ki-67 25%, HER2 FISH positive - Left breast 2:00 biopsy: Invasive ductal carcinoma, grade 2, ER 100%, PR 70%, Ki-67 25%, HER2 negative - Left axillary lymph node biopsy: IDC, ER 100%, PR 50%, HER2 negative, E-cadherin positive - Bilateral breast MRI on 10/09/2021: Multiple enhancing masses with intervening non-mass enhancement involving most of the LOQ and part of the UOQ.  Abnormal enhancement spans approximately 12.4 x 6.4 x 7 cm.  Diffuse skin thickening/edema.  Multiple left axillary lymph nodes, largest node measuring approximately 2.4 cm in the lower axilla.  Indeterminate non-mass enhancement inferior to the biopsy site in the subareolar right breast measuring 1.5 x 0.9 x 0.8 cm. - Right breast biopsy UOQ on 10/01/2021: Fibroadenomatoid nodule with calcifications, fibrocystic changes including apocrine metaplasia with no malignancy. -PET scan from 10/22/2021: Large left breast mass and numerous hypermetabolic left axillary and subpectoral lymph nodes.  2 hypermetabolic left upper lobe lung nodules measuring 7 mm with SUV 4.2 and 3.2 respectively.  No other hypermetabolic lung nodules.  No distant metastatic disease.  Nonspecific low-level subareolar activity in the right  breast near the surgical clips and a single mildly hypermetabolic right axillary lymph node. - Echocardiogram shows EF 60 to 65%. - She has oligometastatic disease with 2 small lung nodules.  Biopsy is not feasible.  I have recommended TCHP regimen followed by restaging after 3 cycles. -6 cycles of dose reduced TCHP from 11/04/2021 through 03/11/2022 - Maintenance Herceptin and pertuzumab started on 04/01/2022, anastrozole started on 04/01/2022 -Left mastectomy and lymph node dissection (05/05/2022): [id number], residual IDC, grade 2, discontinuously involving fibrotic area measuring 5.9 cm, margins negative, LVI positive, response to therapy seen including hyalin fibrosis and dystrophic calcifications.  9/18 lymph nodes positive, largest focus of metastatic carcinoma measures 1.2 cm with extranodal extension.  Right breast lumpectomy shows atypical lobular hyperplasia. - We discussed switching to Kadcyla in the adjuvant setting.  Related to efficacy of Kadcyla compared with pertuzumab and trastuzumab in the adjuvant setting is unknown.  Patient is reluctant to consider switching as she had poor tolerance with the prior chemotherapy.  Hence we have continued Herceptin and Perjeta. - XRT to the chest wall from 09/13/2022   2. Social/family history: - She lives at home with her husband and daughter.  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  Current active smoker, half pack per day since age 61. - Mother had cervical cancer and bladder cancer.  Maternal grandmother had ovarian cancer.  Brother died at age 33 from acute leukemia.  Another brother died of liver cancer.  Maternal half aunt had breast cancer.    Plan: 1. Oligometastatic (two 7 mm lung lesions) HER2 positive left breast cancer: - CT CAP on 04/16/2023: Stable lung nodules.  New subpleural reticulation anteriorly in the left chest compatible with radiation port.  Stable presacral density. - She reports 3 days of diarrhea after each  infusion, 4-5 times per day.  Imodium helps. - She is tolerating anastrozole reasonably well.  She has occasional hot flashes. - Labs today: Normal LFTs.  CBC grossly normal.  CA 15-3 was 26.6 and CA 27-29 was normal. - Continue Herceptin and Perjeta today and 3 weeks.  RTC 6 weeks for follow-up.  Will plan on repeating CT CAP with contrast.   2.  Right facial shingles/postherpetic right hemifacial pain: - Continue gabapentin 300 mg twice daily.  Symptoms are stable.  3.  Hypomagnesemia: - Continue magnesium 2 tablets twice daily.  Magnesium is slightly low at 1.6 today.  4.  High risk drug monitoring: - Will repeat 2D echocardiogram prior to next visit.  5.  Hypertension: - Continue amlodipine daily.  Blood pressure well-controlled.  6.  Left leg DVT (diagnosed 03/04/2022): - Continue Eliquis twice daily.  No bleeding issues.  7.  Osteopenia (DEXA 04/19/2022 T score -2.1): - Continue calcium and vitamin D supplements.    Orders Placed This Encounter  Procedures   CT CHEST ABDOMEN PELVIS W CONTRAST    Standing Status:   Future    Expected Date:   09/04/2023    Expiration Date:   08/03/2024    If indicated for the ordered procedure, I authorize the administration of contrast media per Radiology protocol:   Yes    Does the patient have a contrast media/X-ray dye allergy?:   No    Preferred imaging location?:   [facility]    Release to patient:   Immediate    If indicated for the ordered procedure, I authorize the administration of oral contrast media per Radiology protocol:   Yes   ECHOCARDIOGRAM COMPLETE    Standing Status:   Future    Expected Date:   09/04/2023    Expiration Date:   08/03/2024    Where should this test be performed:   [facility]    Perflutren DEFINITY (image enhancing agent) should be administered unless hypersensitivity or allergy exist:   Administer Perflutren    Reason for exam-Echo:   Chemo  Z09      I,Helena R Teague,acting as a scribe for  [name], MD.,have documented all relevant documentation on the behalf of [name], MD,as directed by  [name], MD while in the presence of [name], MD.  I, [name] MD, have reviewed the above documentation for accuracy and completeness, and I agree with the above.     [name], MD   12/24/20241:00 PM  CHIEF COMPLAINT:   Diagnosis: locally advanced left breast cancer    Cancer Staging  Breast cancer, left [facility]) Staging form: Breast, AJCC 8th Edition - Clinical stage from 10/15/2021: Stage IIA (cT3, cN1, cM0, G2, ER+, PR+, HER2+) - Unsigned    Prior Therapy: TCHP x 6 cycles   Current Therapy:  Anastrozole daily, trastuzumab and Herceptin every 21 days    HISTORY OF PRESENT ILLNESS:   Oncology History  Breast cancer, left (HCC)  10/15/2021 Initial Diagnosis   Breast cancer, left (HCC)   11/04/2021 - 04/22/2022 Chemotherapy   Patient is on Treatment Plan : BREAST  Docetaxel + Carboplatin + Trastuzumab + Pertuzumab  (TCHP) q21d       Genetic Testing   Negative genetic testing. No pathogenic variants identified on the [facility] Multi-Cancer+RNA panel. The report date is 11/02/2021.  The Multi-Cancer Panel + RNA offered by [facility] includes sequencing and/or deletion duplication testing of the following 84 genes: AIP, ALK, APC, ATM, AXIN2,BAP1,  BARD1, BLM, BMPR1A, BRCA1, BRCA2, BRIP1, CASR, CDC73, CDH1, CDK4, CDKN1B, CDKN1C, CDKN2A (p14ARF), CDKN2A (p16INK4a), CEBPA, CHEK2, CTNNA1, DICER1, DIS3L2, EGFR (c.2369C>T, p.Thr790Met variant only), EPCAM (Deletion/duplication testing only), FH, FLCN, GATA2, GPC3, GREM1 (Promoter region deletion/duplication testing only), HOXB13 (c.251G>A, p.Gly84Glu), HRAS, KIT, MAX, MEN1, MET, MITF (c.952G>A, p.Glu318Lys variant only), MLH1, MSH2, MSH3, MSH6, MUTYH, NBN, NF1, NF2, NTHL1, PALB2, PDGFRA, PHOX2B, PMS2, POLD1, POLE, POT1, PRKAR1A, PTCH1, PTEN, RAD50, RAD51C, RAD51D, RB1, RECQL4, RET,  RUNX1, SDHAF2, SDHA (sequence changes only), SDHB, SDHC, SDHD, SMAD4, SMARCA4, SMARCB1, SMARCE1, STK11, SUFU, TERC, TERT, TMEM127, TP53, TSC1, TSC2, VHL, WRN and WT1.   11/04/2021 -  Chemotherapy   Patient is on Treatment Plan : BREAST  Docetaxel + Carboplatin + Trastuzumab + Pertuzumab  (TCHP) q21d / Trastuzumab + Pertuzumab q21d        INTERVAL HISTORY:   Traci Fuller is a 67 y.o. female presenting to clinic today for follow up of locally advanced left breast cancer. She was last seen by me on 04/21/23.  Today, she states that she is doing well overall. Her appetite level is at 100%. Her energy level is at 65%.  She is tolerating anastrozole, though she has occasional mild hot flashes. She reports diarrhea 4-5 times a day for 3 times a week after each infusion. She took 2 Imodium pills after her first onset of symptoms, which improved diarrhea. She is taking all medications as prescribed. She denies any dyspnea when lying down. She notes occasional ankle swellings when on her feet most of the day.   PAST MEDICAL HISTORY:   Past Medical History: 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 DES to midLAD, 75% stenosis of RCA not treated   Cancer [facility])    Current smoker    Essential hypertension    Family history of bladder cancer    Family history of breast cancer    Family history of leukemia    Family history of uterine cancer    Hyperlipidemia LDL goal <70    Myocardial infarction (HCC)    PONV (postoperative nausea and vomiting)    Port-A-Cath in place 10/29/2021    Surgical History: Past Surgical History:  Procedure Laterality Date   ABDOMINAL HYSTERECTOMY     BREAST BIOPSY Bilateral 10/16/2021   BREAST BIOPSY WITH RADIO FREQUENCY LOCALIZER Right 05/05/2022   Procedure: BREAST BIOPSY WITH RADIO FREQUENCY LOCALIZER;  Surgeon: Lucretia Roers, MD;  Location: [facility];  Service: General;  Laterality: Right;   CORONARY STENT INTERVENTION  N/A 01/10/2018   Procedure: CORONARY STENT INTERVENTION;  Surgeon: Corky Crafts, MD;  Location: [facility] INVASIVE CV LAB;  Service: Cardiovascular;  Laterality: N/A;   LEFT HEART CATH AND CORONARY ANGIOGRAPHY N/A 01/10/2018   Procedure: LEFT HEART CATH AND CORONARY ANGIOGRAPHY;  Surgeon: Corky Crafts, MD;  Location: [facility] INVASIVE CV LAB;  Service: Cardiovascular;  Laterality: N/A;   MASTECTOMY MODIFIED RADICAL Left 05/05/2022   Procedure: MASTECTOMY MODIFIED RADICAL WITH RADIO FREQUENCY LOCALIZER;  Surgeon: Lucretia Roers, MD;  Location: [facility];  Service: General;  Laterality: Left;   PORTACATH PLACEMENT Right 10/26/2021   Procedure: INSERTION PORT-A-CATH;  Surgeon: Lucretia Roers, MD;  Location: [facility];  Service: General;  Laterality: Right;    Social History: Social History   Socioeconomic History   Marital  status: Married    Spouse name: Not on file   Number of children: Not on file   Years of education: Not on file   Highest education level: Not on file  Occupational History   Not on file  Tobacco Use   Smoking status: Every Day    Current packs/day: 1.00    Average packs/day: 1 pack/day for 49.0 years (49.0 ttl pk-yrs)    Types: Cigarettes    Start date: 28   Smokeless tobacco: Never  Vaping Use   Vaping status: Never Used  Substance and Sexual Activity   Alcohol use: Not Currently   Drug use: Never   Sexual activity: Yes  Other Topics Concern   Not on file  Social History Narrative   Not on file   Social Drivers of Health   Financial Resource Strain: Not on file  Food Insecurity: No Food Insecurity (05/05/2022)   Hunger Vital Sign    Worried About Running Out of Food in the Last Year: Never true    Ran Out of Food in the Last Year: Never true  Transportation Needs: No Transportation Needs (05/05/2022)   PRAPARE - Administrator, Civil Service (Medical): No    Lack of Transportation (Non-Medical): No  Physical Activity: Not on file   Stress: Not on file  Social Connections: Unknown (12/06/2022)   [facility]   Social Network    Social Network: Not on file  Intimate Partner Violence: Unknown (12/06/2022)   Received from [facility]   HITS    Physically Hurt: Not on file    Insult or Talk Down To: Not on file    Threaten Physical Harm: Not on file    Scream or Curse: Not on file    Family History: Family History  Problem Relation Age of Onset   Bladder Cancer Mother 6   Cervical cancer Mother        dx 1s, hysterectomy   Hypertension Father    Cirrhosis Father    Leukemia Brother 3   Liver cancer Brother 84   Breast cancer Maternal Aunt        dx 26s   Cancer Maternal Uncle        unk type d. 39   Cancer Paternal Aunt        unk type   Breast cancer Maternal Grandmother        dx 13s   Uterine cancer Maternal Grandmother     Current Medications:  Current Outpatient Medications:    ALPRAZolam (XANAX) 0.25 MG tablet, TAKE 1 TABLET(0.25 MG) BY MOUTH TWICE DAILY AS NEEDED FOR ANXIETY, Disp: 60 tablet, Rfl: 0   amLODipine (NORVASC) 10 MG tablet, Take 10 mg by mouth daily., Disp: , Rfl:    anastrozole (ARIMIDEX) 1 MG tablet, TAKE 1 TABLET(1 MG) BY MOUTH DAILY, Disp: 90 tablet, Rfl: 4   aspirin EC 81 MG tablet, Take 1 tablet (81 mg total) by mouth daily., Disp: 90 tablet, Rfl: 3   Calcium Carb-Cholecalciferol (CALCIUM 600/VITAMIN D3 PO), Take 1 tablet by mouth in the morning and at bedtime., Disp: , Rfl:    ELIQUIS 5 MG TABS tablet, TAKE 1 TABLET(5 MG) BY MOUTH TWICE DAILY, Disp: 60 tablet, Rfl: 5   Evolocumab (REPATHA SURECLICK) 140 MG/ML SOAJ, Inject 140 mg into the skin every 14 (fourteen) days., Disp: 2 mL, Rfl: 11   gabapentin (NEURONTIN) 300 MG capsule, Take 300 mg by mouth 2 (two)  times daily., Disp: , Rfl:    magnesium oxide (MAG-OX) 400 (240 Mg) MG tablet, TAKE 1 TABLET BY MOUTH EVERY MORNING, AT NOON, AND EVERY NIGHT AT BEDTIME., Disp: 90 tablet,  Rfl: 3   metoprolol tartrate (LOPRESSOR) 25 MG tablet, Take 1 tablet (25 mg total) by mouth 2 (two) times daily. Please call and schedule overdue appointment with Dr. Allyson Sabal for further refills . SECOND ATTEMPT!, Disp: 30 tablet, Rfl: 0   mometasone (ELOCON) 0.1 % cream, Apply topically 2 (two) times daily., Disp: , Rfl:    omeprazole (PRILOSEC OTC) 20 MG tablet, Take 20 mg by mouth daily., Disp: , Rfl:    ondansetron (ZOFRAN-ODT) 4 MG disintegrating tablet, Take 1 tablet (4 mg total) by mouth every 6 (six) hours as needed for nausea., Disp: 20 tablet, Rfl: 0   oxyCODONE (OXY IR/ROXICODONE) 5 MG immediate release tablet, Take 1-2 tablets (5-10 mg total) by mouth every 4 (four) hours as needed for severe pain or breakthrough pain., Disp: 30 tablet, Rfl: 0   pegfilgrastim-bmez (ZIEXTENZO) 6 MG/0.6ML injection, Inject 6 mg into the skin every 21 ( twenty-one) days. AFTER chemotherapy., Disp: , Rfl:    Pertuzumab (PERJETA IV), Inject into the vein every 21 ( twenty-one) days., Disp: , Rfl:    sulfamethoxazole-trimethoprim (BACTRIM DS) 800-160 MG tablet, Take 1 tablet by mouth 2 (two) times daily., Disp: 14 tablet, Rfl: 0   trastuzumab-anns (KANJINTI) 150 MG SOLR injection, Inject 462 mg into the vein every 21 ( twenty-one) days., Disp: , Rfl:    Allergies: No Known Allergies  REVIEW OF SYSTEMS:   Review of Systems  Constitutional:  Negative for chills, fatigue and fever.  HENT:   Negative for lump/mass, mouth sores, nosebleeds, sore throat and trouble swallowing.   Eyes:  Negative for eye problems.  Respiratory:  Negative for cough and shortness of breath.   Cardiovascular:  Negative for chest pain, leg swelling and palpitations.  Gastrointestinal:  Positive for diarrhea. Negative for abdominal pain, constipation, nausea and vomiting.  Endocrine: Positive for hot flashes (occasional).  Genitourinary:  Negative for bladder incontinence, difficulty urinating, dysuria, frequency, hematuria and  nocturia.   Musculoskeletal:  Negative for arthralgias, back pain, flank pain, myalgias and neck pain.  Skin:  Negative for itching and rash.  Neurological:  Negative for dizziness, headaches and numbness.       +tingling fingers  Hematological:  Does not bruise/bleed easily.  Psychiatric/Behavioral:  Negative for depression, sleep disturbance and suicidal ideas. The patient is not nervous/anxious.   All other systems reviewed and are negative.    VITALS:   Blood pressure 124/73, pulse (!) 59, temperature 98.3 F (36.8 C), temperature source Tympanic, resp. rate 18, weight 153 lb 12.8 oz (69.8 kg), SpO2 100%.  Wt Readings from Last 3 Encounters:  08/04/23 153 lb 12.8 oz (69.8 kg)  07/14/23 152 lb 12.8 oz (69.3 kg)  06/23/23 156 lb 3.2 oz (70.9 kg)    Body mass index is 29.06 kg/m.  Performance status (ECOG): 1 - Symptomatic but completely ambulatory  PHYSICAL EXAM:   Physical Exam Vitals and nursing note reviewed. Exam conducted with a chaperone present.  Constitutional:      Appearance: Normal appearance.  Cardiovascular:     Rate and Rhythm: Normal rate and regular rhythm.     Pulses: Normal pulses.     Heart sounds: Normal heart sounds.  Pulmonary:     Effort: Pulmonary effort is normal.     Breath sounds: Normal breath sounds.  Abdominal:     Palpations: Abdomen is soft. There is no hepatomegaly, splenomegaly or mass.     Tenderness: There is no abdominal tenderness.  Musculoskeletal:     Right lower leg: Edema (trace) present.     Left lower leg: Edema (trace) present.  Lymphadenopathy:     Cervical: No cervical adenopathy.     Right cervical: No superficial, deep or posterior cervical adenopathy.    Left cervical: No superficial, deep or posterior cervical adenopathy.     Upper Body:     Right upper body: No supraclavicular or axillary adenopathy.     Left upper body: No supraclavicular or axillary adenopathy.  Neurological:     General: No focal deficit  present.     Mental Status: She is alert and oriented to person, place, and time.  Psychiatric:        Mood and Affect: Mood normal.        Behavior: Behavior normal.     LABS:      Latest Ref Rng & Units 08/04/2023    9:41 AM 07/14/2023   10:09 AM 06/23/2023    8:15 AM  CBC  WBC 4.0 - 10.5 K/uL 5.8  7.2  5.2   Hemoglobin 12.0 - 15.0 g/dL 84.1  66.0  63.0   Hematocrit 36.0 - 46.0 % 37.8  36.6  36.3   Platelets 150 - 400 K/uL 212  196  189       Latest Ref Rng & Units 08/04/2023    9:41 AM 07/14/2023   10:09 AM 06/23/2023    8:15 AM  CMP  Glucose 70 - 99 mg/dL 160  89  99   BUN 8 - 23 mg/dL 32  28  32   Creatinine 0.44 - 1.00 mg/dL 1.09  3.23  5.57   Sodium 135 - 145 mmol/L 136  135  136   Potassium 3.5 - 5.1 mmol/L 4.1  4.1  4.1   Chloride 98 - 111 mmol/L 106  102  102   CO2 22 - 32 mmol/L 22  24  25    Calcium 8.9 - 10.3 mg/dL 32.2  9.8  02.5   Total Protein 6.5 - 8.1 g/dL 7.0  7.0  6.8   Total Bilirubin <1.2 mg/dL 0.5  0.6  0.5   Alkaline Phos 38 - 126 U/L 104  108  104   AST 15 - 41 U/L 16  14  14    ALT 0 - 44 U/L 12  14  15       No results found for: "CEA1", "CEA" / No results found for: "CEA1", "CEA" No results found for: "PSA1" No results found for: "KYH062" No results found for: "CAN125"  No results found for: "TOTALPROTELP", "ALBUMINELP", "A1GS", "A2GS", "BETS", "BETA2SER", "GAMS", "MSPIKE", "SPEI" No results found for: "TIBC", "FERRITIN", "IRONPCTSAT" No results found for: "LDH"   STUDIES:   No results found.

## 2023-08-04 ENCOUNTER — Inpatient Hospital Stay (HOSPITAL_BASED_OUTPATIENT_CLINIC_OR_DEPARTMENT_OTHER): Payer: Managed Care, Other (non HMO) | Admitting: Hematology

## 2023-08-04 ENCOUNTER — Encounter (HOSPITAL_COMMUNITY): Payer: Self-pay | Admitting: Hematology

## 2023-08-04 ENCOUNTER — Encounter: Payer: Self-pay | Admitting: Hematology

## 2023-08-04 ENCOUNTER — Inpatient Hospital Stay: Payer: Self-pay

## 2023-08-04 ENCOUNTER — Inpatient Hospital Stay: Payer: Managed Care, Other (non HMO) | Attending: Oncology

## 2023-08-04 VITALS — BP 124/73 | HR 59 | Temp 98.3°F | Resp 18 | Wt 153.8 lb

## 2023-08-04 VITALS — BP 134/70 | HR 62 | Temp 97.8°F | Resp 18

## 2023-08-04 DIAGNOSIS — Z7901 Long term (current) use of anticoagulants: Secondary | ICD-10-CM | POA: Diagnosis not present

## 2023-08-04 DIAGNOSIS — Z79899 Other long term (current) drug therapy: Secondary | ICD-10-CM | POA: Insufficient documentation

## 2023-08-04 DIAGNOSIS — Z86718 Personal history of other venous thrombosis and embolism: Secondary | ICD-10-CM | POA: Insufficient documentation

## 2023-08-04 DIAGNOSIS — C50212 Malignant neoplasm of upper-inner quadrant of left female breast: Secondary | ICD-10-CM | POA: Diagnosis present

## 2023-08-04 DIAGNOSIS — M8580 Other specified disorders of bone density and structure, unspecified site: Secondary | ICD-10-CM | POA: Insufficient documentation

## 2023-08-04 DIAGNOSIS — Z170 Estrogen receptor positive status [ER+]: Secondary | ICD-10-CM

## 2023-08-04 DIAGNOSIS — F17210 Nicotine dependence, cigarettes, uncomplicated: Secondary | ICD-10-CM | POA: Insufficient documentation

## 2023-08-04 DIAGNOSIS — Z5112 Encounter for antineoplastic immunotherapy: Secondary | ICD-10-CM | POA: Diagnosis present

## 2023-08-04 DIAGNOSIS — C50812 Malignant neoplasm of overlapping sites of left female breast: Secondary | ICD-10-CM

## 2023-08-04 DIAGNOSIS — I10 Essential (primary) hypertension: Secondary | ICD-10-CM | POA: Diagnosis not present

## 2023-08-04 DIAGNOSIS — Z79811 Long term (current) use of aromatase inhibitors: Secondary | ICD-10-CM | POA: Insufficient documentation

## 2023-08-04 DIAGNOSIS — Z95828 Presence of other vascular implants and grafts: Secondary | ICD-10-CM

## 2023-08-04 DIAGNOSIS — C50412 Malignant neoplasm of upper-outer quadrant of left female breast: Secondary | ICD-10-CM | POA: Diagnosis present

## 2023-08-04 DIAGNOSIS — C50912 Malignant neoplasm of unspecified site of left female breast: Secondary | ICD-10-CM

## 2023-08-04 LAB — COMPREHENSIVE METABOLIC PANEL
ALT: 12 U/L (ref 0–44)
AST: 16 U/L (ref 15–41)
Albumin: 3.8 g/dL (ref 3.5–5.0)
Alkaline Phosphatase: 104 U/L (ref 38–126)
Anion gap: 8 (ref 5–15)
BUN: 32 mg/dL — ABNORMAL HIGH (ref 8–23)
CO2: 22 mmol/L (ref 22–32)
Calcium: 10 mg/dL (ref 8.9–10.3)
Chloride: 106 mmol/L (ref 98–111)
Creatinine, Ser: 1.68 mg/dL — ABNORMAL HIGH (ref 0.44–1.00)
GFR, Estimated: 33 mL/min — ABNORMAL LOW (ref >60)
Glucose, Bld: 134 mg/dL — ABNORMAL HIGH (ref 70–99)
Potassium: 4.1 mmol/L (ref 3.5–5.1)
Sodium: 136 mmol/L (ref 135–145)
Total Bilirubin: 0.5 mg/dL (ref <1.2)
Total Protein: 7 g/dL (ref 6.5–8.1)

## 2023-08-04 LAB — CBC WITH DIFFERENTIAL/PLATELET
Abs Immature Granulocytes: 0.02 10*3/uL (ref 0.00–0.07)
Basophils Absolute: 0.1 10*3/uL (ref 0.0–0.1)
Basophils Relative: 1 %
Eosinophils Absolute: 0.2 10*3/uL (ref 0.0–0.5)
Eosinophils Relative: 4 %
HCT: 37.8 % (ref 36.0–46.0)
Hemoglobin: 12 g/dL (ref 12.0–15.0)
Immature Granulocytes: 0 %
Lymphocytes Relative: 16 %
Lymphs Abs: 0.9 10*3/uL (ref 0.7–4.0)
MCH: 30.4 pg (ref 26.0–34.0)
MCHC: 31.7 g/dL (ref 30.0–36.0)
MCV: 95.7 fL (ref 80.0–100.0)
Monocytes Absolute: 0.3 10*3/uL (ref 0.1–1.0)
Monocytes Relative: 6 %
Neutro Abs: 4.2 10*3/uL (ref 1.7–7.7)
Neutrophils Relative %: 73 %
Platelets: 212 10*3/uL (ref 150–400)
RBC: 3.95 MIL/uL (ref 3.87–5.11)
RDW: 15.5 % (ref 11.5–15.5)
WBC: 5.8 10*3/uL (ref 4.0–10.5)
nRBC: 0 % (ref 0.0–0.2)

## 2023-08-04 LAB — MAGNESIUM: Magnesium: 1.6 mg/dL — ABNORMAL LOW (ref 1.7–2.4)

## 2023-08-04 MED ORDER — HEPARIN SOD (PORK) LOCK FLUSH 100 UNIT/ML IV SOLN
500.0000 [IU] | Freq: Once | INTRAVENOUS | Status: AC | PRN
  Administered 2023-08-04: 500 [IU]

## 2023-08-04 MED ORDER — SODIUM CHLORIDE 0.9 % IV SOLN
420.0000 mg | Freq: Once | INTRAVENOUS | Status: AC
  Administered 2023-08-04: 420 mg via INTRAVENOUS
  Filled 2023-08-04: qty 14

## 2023-08-04 MED ORDER — SODIUM CHLORIDE 0.9% FLUSH
10.0000 mL | Freq: Once | INTRAVENOUS | Status: AC
  Administered 2023-08-04: 10 mL via INTRAVENOUS

## 2023-08-04 MED ORDER — SODIUM CHLORIDE 0.9% FLUSH
10.0000 mL | INTRAVENOUS | Status: DC | PRN
  Administered 2023-08-04: 10 mL

## 2023-08-04 MED ORDER — TRASTUZUMAB-ANNS CHEMO 150 MG IV SOLR
6.0000 mg/kg | Freq: Once | INTRAVENOUS | Status: AC
  Administered 2023-08-04: 420 mg via INTRAVENOUS
  Filled 2023-08-04: qty 20

## 2023-08-04 MED ORDER — SODIUM CHLORIDE 0.9 % IV SOLN: Freq: Once | INTRAVENOUS | Status: AC

## 2023-08-04 NOTE — Progress Notes (Signed)
Patient presents today for treatment and follow up visit with Dr. Ellin Saba. Labs and vital signs within parameters for treatment. Magnesium 1.6 today. Labs reviewed by MD.   Message received from A. Dareen Piano RN / Dr. Ellin Saba to proceed with treatment. Patient teaching performed pertaining to taking Imodium at home diligently to prevent diarrhea.  Patient declined IV magnesium today. Patient teaching performed pertaining to Imodium administration at home. Understanding verbalized.   Patient took Tylenol 650 mg and Cetirizine 10 mg PO prior to arrival. 09:00 am.   Treatment given today per MD orders. Tolerated infusion without adverse affects. Vital signs stable. No complaints at this time. Discharged from clinic ambulatory in stable condition. Alert and oriented x 3. F/U with [facility] as scheduled.

## 2023-08-04 NOTE — Patient Instructions (Signed)
[facility] - A DEPT OF [facility]  Discharge Instructions: Thank you for choosing [facility] to provide your oncology and hematology care.  If you have a lab appointment with the Cancer Center - please note that after April 8th, 2024, all labs will be drawn in the cancer center.  You do not have to check in or register with the main entrance as you have in the past but will complete your check-in in the cancer center.  Wear comfortable clothing and clothing appropriate for easy access to any Portacath or PICC line.   We strive to give you quality time with your provider. You may need to reschedule your appointment if you arrive late (15 or more minutes).  Arriving late affects you and other patients whose appointments are after yours.  Also, if you miss three or more appointments without notifying the office, you may be dismissed from the clinic at the provider's discretion.      For prescription refill requests, have your pharmacy contact our office and allow 72 hours for refills to be completed.    Today you received the following chemotherapy and/or immunotherapy agents Kanjinti, Perjeta.  Pertuzumab Injection What is this medication? PERTUZUMAB (per TOOZ ue mab) treats breast cancer. It works by blocking a protein that causes cancer cells to grow and multiply. This helps to slow or stop the spread of cancer cells. It is a monoclonal antibody. This medicine may be used for other purposes; ask your health care provider or pharmacist if you have questions. COMMON BRAND NAME(S): PERJETA What should I tell my care team before I take this medication? They need to know if you have any of these conditions: Heart failure An unusual or allergic reaction to pertuzumab, other medications, foods, dyes, or preservatives Pregnant or trying to get pregnant Breast-feeding How should I use this medication? This medication is injected into a vein. It is given  by your care team in a hospital or clinic setting. Talk to your care team about the use of this medication in children. Special care may be needed. Overdosage: If you think you have taken too much of this medicine contact a poison control center or emergency room at once. NOTE: This medicine is only for you. Do not share this medicine with others. What if I miss a dose? Keep appointments for follow-up doses. It is important not to miss your dose. Call your care team if you are unable to keep an appointment. What may interact with this medication? Interactions are not expected. This list may not describe all possible interactions. Give your health care provider a list of all the medicines, herbs, non-prescription drugs, or dietary supplements you use. Also tell them if you smoke, drink alcohol, or use illegal drugs. Some items may interact with your medicine. What should I watch for while using this medication? Your condition will be monitored carefully while you are receiving this medication. This medication may make you feel generally unwell. This is not uncommon as chemotherapy can affect healthy cells as well as cancer cells. Report any side effects. Continue your course of treatment even though you feel ill unless your care team tells you to stop. Talk to your care team if you may be pregnant. Serious birth defects can occur if you take this medication during pregnancy and for 7 months after the last dose. You will need a negative pregnancy test before starting this medication. Contraception is recommended while taking  this medication and for 7 months after the last dose. Your care team can help you find the option that works for you. Do not breastfeed while taking this medication and for 7 months after the last dose. What side effects may I notice from receiving this medication? Side effects that you should report to your care team as soon as possible: Allergic reactions or angioedema--skin  rash, itching or hives, swelling of the face, eyes, lips, tongue, arms, or legs, trouble swallowing or breathing Heart failure--shortness of breath, swelling of the ankles, feet, or hands, sudden weight gain, unusual weakness or fatigue Infusion reactions--chest pain, shortness of breath or trouble breathing, feeling faint or lightheaded Side effects that usually do not require medical attention (report to your care team if they continue or are bothersome): Diarrhea Dry skin Fatigue Hair loss Nausea Vomiting This list may not describe all possible side effects. Call your doctor for medical advice about side effects. You may report side effects to FDA at 1-800-FDA-1088. Where should I keep my medication? This medication is given in a hospital or clinic. It will not be stored at home. NOTE: This sheet is a summary. It may not cover all possible information. If you have questions about this medicine, talk to your doctor, pharmacist, or health care provider.  2024 Elsevier/Gold Standard (2021-12-22 00:00:00) Trastuzumab Injection What is this medication? TRASTUZUMAB (tras TOO zoo mab) treats breast cancer and stomach cancer. It works by blocking a protein that causes cancer cells to grow and multiply. This helps to slow or stop the spread of cancer cells. This medicine may be used for other purposes; ask your health care provider or pharmacist if you have questions. COMMON BRAND NAME(S): Herceptin, Marlowe Alt, Ontruzant, Trazimera What should I tell my care team before I take this medication? They need to know if you have any of these conditions: Heart failure Lung disease An unusual or allergic reaction to trastuzumab, other medications, foods, dyes, or preservatives Pregnant or trying to get pregnant Breast-feeding How should I use this medication? This medication is injected into a vein. It is given by your care team in a hospital or clinic setting. Talk to your care team  about the use of this medication in children. It is not approved for use in children. Overdosage: If you think you have taken too much of this medicine contact a poison control center or emergency room at once. NOTE: This medicine is only for you. Do not share this medicine with others. What if I miss a dose? Keep appointments for follow-up doses. It is important not to miss your dose. Call your care team if you are unable to keep an appointment. What may interact with this medication? Certain types of chemotherapy, such as daunorubicin, doxorubicin, epirubicin, idarubicin This list may not describe all possible interactions. Give your health care provider a list of all the medicines, herbs, non-prescription drugs, or dietary supplements you use. Also tell them if you smoke, drink alcohol, or use illegal drugs. Some items may interact with your medicine. What should I watch for while using this medication? Your condition will be monitored carefully while you are receiving this medication. This medication may make you feel generally unwell. This is not uncommon, as chemotherapy affects healthy cells as well as cancer cells. Report any side effects. Continue your course of treatment even though you feel ill unless your care team tells you to stop. This medication may increase your risk of getting an infection. Call your care  team for advice if you get a fever, chills, sore throat, or other symptoms of a cold or flu. Do not treat yourself. Try to avoid being around people who are sick. Avoid taking medications that contain aspirin, acetaminophen, ibuprofen, naproxen, or ketoprofen unless instructed by your care team. These medications can hide a fever. Talk to your care team if you may be pregnant. Serious birth defects can occur if you take this medication during pregnancy and for 7 months after the last dose. You will need a negative pregnancy test before starting this medication. Contraception is  recommended while taking this medication and for 7 months after the last dose. Your care team can help you find the option that works for you. Do not breastfeed while taking this medication and for 7 months after stopping treatment. What side effects may I notice from receiving this medication? Side effects that you should report to your care team as soon as possible: Allergic reactions or angioedema--skin rash, itching or hives, swelling of the face, eyes, lips, tongue, arms, or legs, trouble swallowing or breathing Dry cough, shortness of breath or trouble breathing Heart failure--shortness of breath, swelling of the ankles, feet, or hands, sudden weight gain, unusual weakness or fatigue Infection--fever, chills, cough, or sore throat Infusion reactions--chest pain, shortness of breath or trouble breathing, feeling faint or lightheaded Side effects that usually do not require medical attention (report to your care team if they continue or are bothersome): Diarrhea Dizziness Headache Nausea Trouble sleeping Vomiting This list may not describe all possible side effects. Call your doctor for medical advice about side effects. You may report side effects to FDA at 1-800-FDA-1088. Where should I keep my medication? This medication is given in a hospital or clinic. It will not be stored at home. NOTE: This sheet is a summary. It may not cover all possible information. If you have questions about this medicine, talk to your doctor, pharmacist, or health care provider.  2024 Elsevier/Gold Standard (2021-12-22 00:00:00)       To help prevent nausea and vomiting after your treatment, we encourage you to take your nausea medication as directed.  BELOW ARE SYMPTOMS THAT SHOULD BE REPORTED IMMEDIATELY: *FEVER GREATER THAN 100.4 F (38 C) OR HIGHER *CHILLS OR SWEATING *NAUSEA AND VOMITING THAT IS NOT CONTROLLED WITH YOUR NAUSEA MEDICATION *UNUSUAL SHORTNESS OF BREATH *UNUSUAL BRUISING OR  BLEEDING *URINARY PROBLEMS (pain or burning when urinating, or frequent urination) *BOWEL PROBLEMS (unusual diarrhea, constipation, pain near the anus) TENDERNESS IN MOUTH AND THROAT WITH OR WITHOUT PRESENCE OF ULCERS (sore throat, sores in mouth, or a toothache) UNUSUAL RASH, SWELLING OR PAIN  UNUSUAL VAGINAL DISCHARGE OR ITCHING   Items with * indicate a potential emergency and should be followed up as soon as possible or go to the Emergency Department if any problems should occur.  Please show the CHEMOTHERAPY ALERT CARD or IMMUNOTHERAPY ALERT CARD at check-in to the Emergency Department and triage nurse.  Should you have questions after your visit or need to cancel or reschedule your appointment, please contact [facility]  - A DEPT OF Eligha Bridegroom [facility] [phone]  and follow the prompts.  Office hours are 8:00 a.m. to 4:30 p.m. Monday - Friday. Please note that voicemails left after 4:00 p.m. may not be returned until the following business day.  We are closed weekends and major holidays. You have access to a nurse at all times for urgent questions. Please call the main number to the clinic  [phone] and follow the prompts.  For any non-urgent questions, you may also contact your provider using MyChart. We now offer e-Visits for anyone 23 and older to request care online for non-urgent symptoms. For details visit [email or web address].   Also download the MyChart app! Go to the app store, search "MyChart", open the app, select [facility], and log in with your MyChart username and password.

## 2023-08-04 NOTE — Patient Instructions (Signed)
[facility] at [facility] Discharge Instructions   You were seen and examined today by Dr. Ellin Saba.  He reviewed the results of your lab work which are mostly normal/stable. Your magnesium is slightly low at 1.6. This is likely coming from the recent diarrhea. Continue taking magnesium at home as prescribed.   We will proceed with your treatment today.   We will repeat a CT scan prior to your next visit, some time in January.   Return as scheduled.    Thank you for choosing [facility] at [facility] to provide your oncology and hematology care.  To afford each patient quality time with our provider, please arrive at least 15 minutes before your scheduled appointment time.   If you have a lab appointment with the Cancer Center please come in thru the Main Entrance and check in at the main information desk.  You need to re-schedule your appointment should you arrive 10 or more minutes late.  We strive to give you quality time with our providers, and arriving late affects you and other patients whose appointments are after yours.  Also, if you no show three or more times for appointments you may be dismissed from the clinic at the providers discretion.     Again, thank you for choosing [facility].  Our hope is that these requests will decrease the amount of time that you wait before being seen by our physicians.       _____________________________________________________________  Should you have questions after your visit to [facility], please contact our office at [phone] and follow the prompts.  Our office hours are 8:00 a.m. and 4:30 p.m. Monday - Friday.  Please note that voicemails left after 4:00 p.m. may not be returned until the following business day.  We are closed weekends and major holidays.  You do have access to a nurse 24-7, just call the main number to the clinic [phone] and do not press any  options, hold on the line and a nurse will answer the phone.    For prescription refill requests, have your pharmacy contact our office and allow 72 hours.    Due to Covid, you will need to wear a mask upon entering the hospital. If you do not have a mask, a mask will be given to you at the Main Entrance upon arrival. For doctor visits, patients may have 1 support person age 20 or older with them. For treatment visits, patients can not have anyone with them due to social distancing guidelines and our immunocompromised population.

## 2023-08-06 ENCOUNTER — Other Ambulatory Visit: Payer: Self-pay

## 2023-08-07 ENCOUNTER — Other Ambulatory Visit: Payer: Self-pay

## 2023-08-16 ENCOUNTER — Encounter: Payer: Self-pay | Admitting: Hematology

## 2023-08-16 ENCOUNTER — Encounter (HOSPITAL_COMMUNITY): Payer: Self-pay | Admitting: Hematology

## 2023-08-25 ENCOUNTER — Inpatient Hospital Stay: Payer: Managed Care, Other (non HMO) | Attending: Oncology

## 2023-08-25 ENCOUNTER — Inpatient Hospital Stay: Payer: Managed Care, Other (non HMO)

## 2023-08-25 VITALS — BP 135/71 | HR 56 | Temp 97.4°F | Resp 18

## 2023-08-25 VITALS — BP 141/74 | HR 61 | Temp 96.6°F | Resp 18 | Wt 154.7 lb

## 2023-08-25 DIAGNOSIS — Z79899 Other long term (current) drug therapy: Secondary | ICD-10-CM | POA: Insufficient documentation

## 2023-08-25 DIAGNOSIS — C50212 Malignant neoplasm of upper-inner quadrant of left female breast: Secondary | ICD-10-CM | POA: Insufficient documentation

## 2023-08-25 DIAGNOSIS — C50912 Malignant neoplasm of unspecified site of left female breast: Secondary | ICD-10-CM

## 2023-08-25 DIAGNOSIS — Z9012 Acquired absence of left breast and nipple: Secondary | ICD-10-CM | POA: Diagnosis not present

## 2023-08-25 DIAGNOSIS — Z86718 Personal history of other venous thrombosis and embolism: Secondary | ICD-10-CM | POA: Diagnosis not present

## 2023-08-25 DIAGNOSIS — C50812 Malignant neoplasm of overlapping sites of left female breast: Secondary | ICD-10-CM

## 2023-08-25 DIAGNOSIS — Z79811 Long term (current) use of aromatase inhibitors: Secondary | ICD-10-CM | POA: Insufficient documentation

## 2023-08-25 DIAGNOSIS — Z170 Estrogen receptor positive status [ER+]: Secondary | ICD-10-CM | POA: Insufficient documentation

## 2023-08-25 DIAGNOSIS — Z95828 Presence of other vascular implants and grafts: Secondary | ICD-10-CM

## 2023-08-25 DIAGNOSIS — C50412 Malignant neoplasm of upper-outer quadrant of left female breast: Secondary | ICD-10-CM | POA: Insufficient documentation

## 2023-08-25 DIAGNOSIS — Z7982 Long term (current) use of aspirin: Secondary | ICD-10-CM | POA: Diagnosis not present

## 2023-08-25 DIAGNOSIS — D508 Other iron deficiency anemias: Secondary | ICD-10-CM | POA: Diagnosis not present

## 2023-08-25 DIAGNOSIS — Z7901 Long term (current) use of anticoagulants: Secondary | ICD-10-CM | POA: Insufficient documentation

## 2023-08-25 DIAGNOSIS — Z5112 Encounter for antineoplastic immunotherapy: Secondary | ICD-10-CM | POA: Insufficient documentation

## 2023-08-25 DIAGNOSIS — I129 Hypertensive chronic kidney disease with stage 1 through stage 4 chronic kidney disease, or unspecified chronic kidney disease: Secondary | ICD-10-CM | POA: Diagnosis not present

## 2023-08-25 DIAGNOSIS — F17210 Nicotine dependence, cigarettes, uncomplicated: Secondary | ICD-10-CM | POA: Diagnosis not present

## 2023-08-25 LAB — CBC WITH DIFFERENTIAL/PLATELET
Abs Immature Granulocytes: 0.05 10*3/uL (ref 0.00–0.07)
Basophils Absolute: 0 10*3/uL (ref 0.0–0.1)
Basophils Relative: 0 %
Eosinophils Absolute: 0.2 10*3/uL (ref 0.0–0.5)
Eosinophils Relative: 2 %
HCT: 35.6 % — ABNORMAL LOW (ref 36.0–46.0)
Hemoglobin: 11.4 g/dL — ABNORMAL LOW (ref 12.0–15.0)
Immature Granulocytes: 1 %
Lymphocytes Relative: 14 %
Lymphs Abs: 1 10*3/uL (ref 0.7–4.0)
MCH: 30.4 pg (ref 26.0–34.0)
MCHC: 32 g/dL (ref 30.0–36.0)
MCV: 94.9 fL (ref 80.0–100.0)
Monocytes Absolute: 0.5 10*3/uL (ref 0.1–1.0)
Monocytes Relative: 6 %
Neutro Abs: 5.8 10*3/uL (ref 1.7–7.7)
Neutrophils Relative %: 77 %
Platelets: 217 10*3/uL (ref 150–400)
RBC: 3.75 MIL/uL — ABNORMAL LOW (ref 3.87–5.11)
RDW: 15.2 % (ref 11.5–15.5)
WBC: 7.5 10*3/uL (ref 4.0–10.5)
nRBC: 0 % (ref 0.0–0.2)

## 2023-08-25 LAB — COMPREHENSIVE METABOLIC PANEL
ALT: 13 U/L (ref 0–44)
AST: 17 U/L (ref 15–41)
Albumin: 3.6 g/dL (ref 3.5–5.0)
Alkaline Phosphatase: 100 U/L (ref 38–126)
Anion gap: 8 (ref 5–15)
BUN: 27 mg/dL — ABNORMAL HIGH (ref 8–23)
CO2: 22 mmol/L (ref 22–32)
Calcium: 9.7 mg/dL (ref 8.9–10.3)
Chloride: 106 mmol/L (ref 98–111)
Creatinine, Ser: 1.36 mg/dL — ABNORMAL HIGH (ref 0.44–1.00)
GFR, Estimated: 43 mL/min — ABNORMAL LOW (ref >60)
Glucose, Bld: 138 mg/dL — ABNORMAL HIGH (ref 70–99)
Potassium: 4 mmol/L (ref 3.5–5.1)
Sodium: 136 mmol/L (ref 135–145)
Total Bilirubin: 0.6 mg/dL (ref 0.0–1.2)
Total Protein: 6.9 g/dL (ref 6.5–8.1)

## 2023-08-25 LAB — MAGNESIUM: Magnesium: 1.5 mg/dL — ABNORMAL LOW (ref 1.7–2.4)

## 2023-08-25 MED ORDER — SODIUM CHLORIDE 0.9% FLUSH
10.0000 mL | INTRAVENOUS | Status: DC | PRN
  Administered 2023-08-25: 10 mL via INTRAVENOUS

## 2023-08-25 MED ORDER — MAGNESIUM SULFATE 2 GM/50ML IV SOLN
2.0000 g | Freq: Once | INTRAVENOUS | Status: AC
  Administered 2023-08-25: 2 g via INTRAVENOUS
  Filled 2023-08-25: qty 50

## 2023-08-25 MED ORDER — SODIUM CHLORIDE 0.9% FLUSH
10.0000 mL | INTRAVENOUS | Status: DC | PRN
  Administered 2023-08-25: 10 mL

## 2023-08-25 MED ORDER — PERTUZUMAB CHEMO INJECTION 420 MG/14ML
420.0000 mg | Freq: Once | INTRAVENOUS | Status: AC
  Administered 2023-08-25: 420 mg via INTRAVENOUS
  Filled 2023-08-25: qty 14

## 2023-08-25 MED ORDER — TRASTUZUMAB-ANNS CHEMO 150 MG IV SOLR
6.0000 mg/kg | Freq: Once | INTRAVENOUS | Status: AC
  Administered 2023-08-25: 420 mg via INTRAVENOUS
  Filled 2023-08-25: qty 20

## 2023-08-25 MED ORDER — HEPARIN SOD (PORK) LOCK FLUSH 100 UNIT/ML IV SOLN
500.0000 [IU] | Freq: Once | INTRAVENOUS | Status: AC | PRN
Start: 2023-08-25 — End: 2023-08-25
  Administered 2023-08-25: 500 [IU]

## 2023-08-25 MED ORDER — SODIUM CHLORIDE 0.9 % IV SOLN
Freq: Once | INTRAVENOUS | Status: AC
Start: 2023-08-25 — End: 2023-08-25

## 2023-08-25 NOTE — Progress Notes (Signed)
 Patient presents today for chemotherapy infusion.  Patient is in satisfactory condition with no new complaints voiced.  Vital signs are stable.  Labs reviewed and all labs are within treatment parameters.  Magnesium  today is 1.5.  We will give magnesium  sulfate 2 grams IV x one dose today per standing orders by Dr. Rogers.  Tylenol  and Zyrtec  taken at 0930 prior to visit.  We will proceed with treatment per MD orders.    Patient tolerated treatment well with no complaints voiced.  Patient left ambulatory in stable condition.  Vital signs stable at discharge.  Follow up as scheduled.

## 2023-08-25 NOTE — Patient Instructions (Signed)
 [facility] [place] - A DEPT OF [facility]  Discharge Instructions: Thank you for choosing [facility] to provide your oncology and hematology care.  If you have a lab appointment with the Cancer Center - please note that after April 8th, 2024, all labs will be drawn in the cancer center.  You do not have to check in or register with the main entrance as you have in the past but will complete your check-in in the cancer center.  Wear comfortable clothing and clothing appropriate for easy access to any Portacath or PICC line.   We strive to give you quality time with your provider. You may need to reschedule your appointment if you arrive late (15 or more minutes).  Arriving late affects you and other patients whose appointments are after yours.  Also, if you miss three or more appointments without notifying the office, you may be dismissed from the clinic at the provider's discretion.      For prescription refill requests, have your pharmacy contact our office and allow 72 hours for refills to be completed.    Today you received the following chemotherapy and/or immunotherapy agents Kanjinti /Perjeta .  Trastuzumab  Injection What is this medication? TRASTUZUMAB  (tras TOO zoo mab) treats breast cancer and stomach cancer. It works by blocking a protein that causes cancer cells to grow and multiply. This helps to slow or stop the spread of cancer cells. This medicine may be used for other purposes; ask your health care provider or pharmacist if you have questions. COMMON BRAND NAME(S): Herceptin , Herzuma , KANJINTI , Ogivri , Ontruzant , Trazimera  What should I tell my care team before I take this medication? They need to know if you have any of these conditions: Heart failure Lung disease An unusual or allergic reaction to trastuzumab , other medications, foods, dyes, or preservatives Pregnant or trying to get pregnant Breast-feeding How should I use this  medication? This medication is injected into a vein. It is given by your care team in a hospital or clinic setting. Talk to your care team about the use of this medication in children. It is not approved for use in children. Overdosage: If you think you have taken too much of this medicine contact a poison control center or emergency room at once. NOTE: This medicine is only for you. Do not share this medicine with others. What if I miss a dose? Keep appointments for follow-up doses. It is important not to miss your dose. Call your care team if you are unable to keep an appointment. What may interact with this medication? Certain types of chemotherapy, such as daunorubicin, doxorubicin, epirubicin, idarubicin This list may not describe all possible interactions. Give your health care provider a list of all the medicines, herbs, non-prescription drugs, or dietary supplements you use. Also tell them if you smoke, drink alcohol, or use illegal drugs. Some items may interact with your medicine. What should I watch for while using this medication? Your condition will be monitored carefully while you are receiving this medication. This medication may make you feel generally unwell. This is not uncommon, as chemotherapy affects healthy cells as well as cancer cells. Report any side effects. Continue your course of treatment even though you feel ill unless your care team tells you to stop. This medication may increase your risk of getting an infection. Call your care team for advice if you get a fever, chills, sore throat, or other symptoms of a cold or flu. Do not  treat yourself. Try to avoid being around people who are sick. Avoid taking medications that contain aspirin , acetaminophen , ibuprofen, naproxen, or ketoprofen unless instructed by your care team. These medications can hide a fever. Talk to your care team if you may be pregnant. Serious birth defects can occur if you take this medication during  pregnancy and for 7 months after the last dose. You will need a negative pregnancy test before starting this medication. Contraception is recommended while taking this medication and for 7 months after the last dose. Your care team can help you find the option that works for you. Do not breastfeed while taking this medication and for 7 months after stopping treatment. What side effects may I notice from receiving this medication? Side effects that you should report to your care team as soon as possible: Allergic reactions or angioedema--skin rash, itching or hives, swelling of the face, eyes, lips, tongue, arms, or legs, trouble swallowing or breathing Dry cough, shortness of breath or trouble breathing Heart failure--shortness of breath, swelling of the ankles, feet, or hands, sudden weight gain, unusual weakness or fatigue Infection--fever, chills, cough, or sore throat Infusion reactions--chest pain, shortness of breath or trouble breathing, feeling faint or lightheaded Side effects that usually do not require medical attention (report to your care team if they continue or are bothersome): Diarrhea Dizziness Headache Nausea Trouble sleeping Vomiting This list may not describe all possible side effects. Call your doctor for medical advice about side effects. You may report side effects to FDA at 1-800-FDA-1088. Where should I keep my medication? This medication is given in a hospital or clinic. It will not be stored at home. NOTE: This sheet is a summary. It may not cover all possible information. If you have questions about this medicine, talk to your doctor, pharmacist, or health care provider.  2024 Elsevier/Gold Standard (2021-12-22 00:00:00)    Pertuzumab  Injection What is this medication? PERTUZUMAB  (per TOOZ ue mab) treats breast cancer. It works by blocking a protein that causes cancer cells to grow and multiply. This helps to slow or stop the spread of cancer cells. It is a  monoclonal antibody. This medicine may be used for other purposes; ask your health care provider or pharmacist if you have questions. COMMON BRAND NAME(S): PERJETA  What should I tell my care team before I take this medication? They need to know if you have any of these conditions: Heart failure An unusual or allergic reaction to pertuzumab , other medications, foods, dyes, or preservatives Pregnant or trying to get pregnant Breast-feeding How should I use this medication? This medication is injected into a vein. It is given by your care team in a hospital or clinic setting. Talk to your care team about the use of this medication in children. Special care may be needed. Overdosage: If you think you have taken too much of this medicine contact a poison control center or emergency room at once. NOTE: This medicine is only for you. Do not share this medicine with others. What if I miss a dose? Keep appointments for follow-up doses. It is important not to miss your dose. Call your care team if you are unable to keep an appointment. What may interact with this medication? Interactions are not expected. This list may not describe all possible interactions. Give your health care provider a list of all the medicines, herbs, non-prescription drugs, or dietary supplements you use. Also tell them if you smoke, drink alcohol, or use illegal drugs. Some items may interact  with your medicine. What should I watch for while using this medication? Your condition will be monitored carefully while you are receiving this medication. This medication may make you feel generally unwell. This is not uncommon as chemotherapy can affect healthy cells as well as cancer cells. Report any side effects. Continue your course of treatment even though you feel ill unless your care team tells you to stop. Talk to your care team if you may be pregnant. Serious birth defects can occur if you take this medication during pregnancy  and for 7 months after the last dose. You will need a negative pregnancy test before starting this medication. Contraception is recommended while taking this medication and for 7 months after the last dose. Your care team can help you find the option that works for you. Do not breastfeed while taking this medication and for 7 months after the last dose. What side effects may I notice from receiving this medication? Side effects that you should report to your care team as soon as possible: Allergic reactions or angioedema--skin rash, itching or hives, swelling of the face, eyes, lips, tongue, arms, or legs, trouble swallowing or breathing Heart failure--shortness of breath, swelling of the ankles, feet, or hands, sudden weight gain, unusual weakness or fatigue Infusion reactions--chest pain, shortness of breath or trouble breathing, feeling faint or lightheaded Side effects that usually do not require medical attention (report to your care team if they continue or are bothersome): Diarrhea Dry skin Fatigue Hair loss Nausea Vomiting This list may not describe all possible side effects. Call your doctor for medical advice about side effects. You may report side effects to FDA at 1-800-FDA-1088. Where should I keep my medication? This medication is given in a hospital or clinic. It will not be stored at home. NOTE: This sheet is a summary. It may not cover all possible information. If you have questions about this medicine, talk to your doctor, pharmacist, or health care provider.  2024 Elsevier/Gold Standard (2021-12-22 00:00:00)        To help prevent nausea and vomiting after your treatment, we encourage you to take your nausea medication as directed.  BELOW ARE SYMPTOMS THAT SHOULD BE REPORTED IMMEDIATELY: *FEVER GREATER THAN 100.4 F (38 C) OR HIGHER *CHILLS OR SWEATING *NAUSEA AND VOMITING THAT IS NOT CONTROLLED WITH YOUR NAUSEA MEDICATION *UNUSUAL SHORTNESS OF BREATH *UNUSUAL  BRUISING OR BLEEDING *URINARY PROBLEMS (pain or burning when urinating, or frequent urination) *BOWEL PROBLEMS (unusual diarrhea, constipation, pain near the anus) TENDERNESS IN MOUTH AND THROAT WITH OR WITHOUT PRESENCE OF ULCERS (sore throat, sores in mouth, or a toothache) UNUSUAL RASH, SWELLING OR PAIN  UNUSUAL VAGINAL DISCHARGE OR ITCHING   Items with * indicate a potential emergency and should be followed up as soon as possible or go to the Emergency Department if any problems should occur.  Please show the CHEMOTHERAPY ALERT CARD or IMMUNOTHERAPY ALERT CARD at check-in to the Emergency Department and triage nurse.  Should you have questions after your visit or need to cancel or reschedule your appointment, please contact [facility] [place] - A DEPT OF [facility] [phone]  and follow the prompts.  Office hours are 8:00 a.m. to 4:30 p.m. Monday - Friday. Please note that voicemails left after 4:00 p.m. may not be returned until the following business day.  We are closed weekends and major holidays. You have access to a nurse at all times for urgent questions. Please call the main number  to the clinic [phone] and follow the prompts.  For any non-urgent questions, you may also contact your provider using MyChart. We now offer e-Visits for anyone 7 and older to request care online for non-urgent symptoms. For details visit [email or web address].   Also download the MyChart app! Go to the app store, search MyChart, open the app, select [place], and log in with your MyChart username and password.

## 2023-08-25 NOTE — Progress Notes (Signed)
Patients port flushed without difficulty.  Good blood return noted with no bruising or swelling noted at site.  VSS. Patient remains accessed for treatment.  

## 2023-08-26 LAB — CANCER ANTIGEN 27.29: CA 27.29: 33.9 U/mL (ref 0.0–38.6)

## 2023-08-26 LAB — CANCER ANTIGEN 15-3: CA 15-3: 26.7 U/mL — ABNORMAL HIGH (ref 0.0–25.0)

## 2023-09-04 ENCOUNTER — Other Ambulatory Visit: Payer: Self-pay

## 2023-09-09 ENCOUNTER — Ambulatory Visit (HOSPITAL_COMMUNITY)
Admission: RE | Admit: 2023-09-09 | Discharge: 2023-09-09 | Disposition: A | Discharge status: Home / Self Care | Payer: Managed Care, Other (non HMO) | Source: Ambulatory Visit | Attending: Hematology | Admitting: Hematology

## 2023-09-09 DIAGNOSIS — C50812 Malignant neoplasm of overlapping sites of left female breast: Secondary | ICD-10-CM

## 2023-09-09 DIAGNOSIS — Z79899 Other long term (current) drug therapy: Secondary | ICD-10-CM | POA: Diagnosis present

## 2023-09-09 DIAGNOSIS — Z170 Estrogen receptor positive status [ER+]: Secondary | ICD-10-CM | POA: Diagnosis present

## 2023-09-09 LAB — ECHOCARDIOGRAM COMPLETE
AR max vel: 2.37 cm2
AV Area VTI: 2.56 cm2
AV Area mean vel: 2.32 cm2
AV Mean grad: 2.7 mm[Hg]
AV Peak grad: 5.5 mm[Hg]
Ao pk vel: 1.17 m/s
Area-P 1/2: 2.76 cm2
Calc EF: 58.1 %
S' Lateral: 3 cm
Single Plane A2C EF: 51.9 %
Single Plane A4C EF: 64.3 %

## 2023-09-09 MED ORDER — IOHEXOL 300 MG/ML  SOLN
80.0000 mL | Freq: Once | INTRAMUSCULAR | Status: AC | PRN
  Administered 2023-09-09: 80 mL via INTRAVENOUS

## 2023-09-09 NOTE — Progress Notes (Signed)
  Echocardiogram 2D Echocardiogram has been performed.  Janalyn Harder 09/09/2023, 12:14 PM

## 2023-09-14 NOTE — Progress Notes (Signed)
[facility] [address] [place] [address]    Clinic Day:  09/21/23   Referring physician: [name], MD  Patient Care Team: [name], MD as PCP - General (Internal Medicine) [name], MD as PCP - Cardiology (Cardiology) [name], MD as Medical Oncologist (Medical Oncology) [name], RN as Oncology Nurse Navigator (Medical Oncology)   ASSESSMENT & PLAN:   Assessment: 1. 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 - She noticed lump has gotten bigger and deformed since September 2022.  She has itching over the skin of the breast. - Left breast 4:00 biopsy: Invasive mammary carcinoma, grade 2, ER 100%, PR 70%, Ki-67 25%, HER2 FISH positive - Left breast 2:00 biopsy: Invasive ductal carcinoma, grade 2, ER 100%, PR 70%, Ki-67 25%, HER2 negative - Left axillary lymph node biopsy: IDC, ER 100%, PR 50%, HER2 negative, E-cadherin positive - Bilateral breast MRI on 10/09/2021: Multiple enhancing masses with intervening non-mass enhancement involving most of the LOQ and part of the UOQ.  Abnormal enhancement spans approximately 12.4 x 6.4 x 7 cm.  Diffuse skin thickening/edema.  Multiple left axillary lymph nodes, largest node measuring approximately 2.4 cm in the lower axilla.  Indeterminate non-mass enhancement inferior to the biopsy site in the subareolar right breast measuring 1.5 x 0.9 x 0.8 cm. - Right breast biopsy UOQ on 10/01/2021: Fibroadenomatoid nodule with calcifications, fibrocystic changes including apocrine metaplasia with no malignancy. -PET scan from 10/22/2021: Large left breast mass and numerous hypermetabolic left axillary and subpectoral lymph nodes.  2 hypermetabolic left upper lobe lung nodules measuring 7 mm with SUV 4.2 and 3.2 respectively.  No other hypermetabolic lung nodules.  No distant metastatic disease.  Nonspecific low-level subareolar activity in the right  breast near the surgical clips and a single mildly hypermetabolic right axillary lymph node. - Echocardiogram shows EF 60 to 65%. - She has oligometastatic disease with 2 small lung nodules.  Biopsy is not feasible.  I have recommended TCHP regimen followed by restaging after 3 cycles. -6 cycles of dose reduced TCHP from 11/04/2021 through 03/11/2022 - Maintenance Herceptin and pertuzumab started on 04/01/2022, anastrozole started on 04/01/2022 -Left mastectomy and lymph node dissection (05/05/2022): [id number], residual IDC, grade 2, discontinuously involving fibrotic area measuring 5.9 cm, margins negative, LVI positive, response to therapy seen including hyalin fibrosis and dystrophic calcifications.  9/18 lymph nodes positive, largest focus of metastatic carcinoma measures 1.2 cm with extranodal extension.  Right breast lumpectomy shows atypical lobular hyperplasia. - We discussed switching to Kadcyla in the adjuvant setting.  Related to efficacy of Kadcyla compared with pertuzumab and trastuzumab in the adjuvant setting is unknown.  Patient is reluctant to consider switching as she had poor tolerance with the prior chemotherapy.  Hence we have continued Herceptin and Perjeta. - XRT to the chest wall from 09/13/2022   2. Social/family history: - She lives at home with her husband and daughter.  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  Current active smoker, half pack per day since age 25. - Mother had cervical cancer and bladder cancer.  Maternal grandmother had ovarian cancer.  Brother died at age 66 from acute leukemia.  Another brother died of liver cancer.  Maternal half aunt had breast cancer.    Plan: 1. Oligometastatic (two 7 mm lung lesions) HER2 positive left breast cancer: - She is tolerating Herceptin and pertuzumab reasonably well.  She has diarrhea for 3 days after each infusion. - She is also tolerating anastrozole reasonably well. - Reviewed CT CAP (09/08/2022): No  evidence of new or progressive disease.  Scattered stable tiny pulmonary nodules.  Similar presacral soft tissue mass, likely benign.  Other benign findings were discussed. - CA 15-3 is 26.7 and CA 27-29 is 33.9. - LFTs, CBC were grossly normal.  Creatinine at 1.53 and stable.  We have checked her iron panel which showed ferritin low at 25 and percent saturation 16.  Given her CKD, will give her Venofer 500 mg IV x 1. - Will continue Herceptin and pertuzumab every 3 weeks.  RTC 12 weeks for follow-up.  She started maintenance Herceptin and pertuzumab in August 2023.  May consider discontinuing Herceptin and pertuzumab in August 2025 if her disease in the lungs remain stable.   2.  Right facial shingles/postherpetic right hemifacial pain: - Continue gabapentin 300 mg twice daily.  Symptoms are stable.  3.  Hypomagnesemia: - Continue magnesium 2 tablets twice daily.  Magnesium is normal today.  4.  High risk drug monitoring: - Reviewed echocardiogram from 09/09/2023: LVEF 55 to 60% and stable.  5.  Hypertension: - Continue amlodipine daily.  Blood pressure is normal.  6.  Left leg DVT (diagnosed 03/04/2022): - Continue Eliquis twice daily.  No bleeding issues.  7.  Osteopenia (DEXA 04/19/2022 T score -2.1): - Continue calcium and vitamin D supplements.    Orders Placed This Encounter  Procedures   Magnesium    Standing Status:   Future    Expected Date:   10/06/2023    Expiration Date:   10/05/2024   CBC with Differential    Standing Status:   Future    Expected Date:   10/06/2023    Expiration Date:   10/05/2024   Comprehensive metabolic panel    Standing Status:   Future    Expected Date:   10/06/2023    Expiration Date:   10/05/2024   Magnesium    Standing Status:   Future    Expected Date:   10/27/2023    Expiration Date:   10/26/2024   CBC with Differential    Standing Status:   Future    Expected Date:   10/27/2023    Expiration Date:   10/26/2024   Comprehensive metabolic panel     Standing Status:   Future    Expected Date:   10/27/2023    Expiration Date:   10/26/2024   Magnesium    Standing Status:   Future    Expected Date:   11/17/2023    Expiration Date:   11/16/2024   CBC with Differential    Standing Status:   Future    Expected Date:   11/17/2023    Expiration Date:   11/16/2024   Comprehensive metabolic panel    Standing Status:   Future    Expected Date:   11/17/2023    Expiration Date:   11/16/2024   Magnesium    Standing Status:   Future    Expected Date:   12/08/2023    Expiration Date:   12/07/2024   CBC with Differential    Standing Status:   Future    Expected Date:   12/08/2023    Expiration Date:   12/07/2024   Comprehensive metabolic panel    Standing Status:   Future    Expected Date:   12/08/2023    Expiration Date:   12/07/2024   Magnesium  Standing Status:   Future    Expected Date:   12/29/2023    Expiration Date:   12/28/2024   CBC with Differential    Standing Status:   Future    Expected Date:   12/29/2023    Expiration Date:   12/28/2024   Comprehensive metabolic panel    Standing Status:   Future    Expected Date:   12/29/2023    Expiration Date:   12/28/2024   TSH   Iron and TIBC ([facility] ONLY)   Ferritin   Cancer antigen 27.29    Standing Status:   Standing    Number of Occurrences:   10    Expiration Date:   09/14/2024   Cancer antigen 15-3    Standing Status:   Standing    Number of Occurrences:   10    Expiration Date:   09/14/2024      [name],acting as a scribe for [name], MD.,have documented all relevant documentation on the behalf of [name], MD,as directed by  [name], MD while in the presence of [name], MD.  I, [name] MD, have reviewed the above documentation for accuracy and completeness, and I agree with the above.      [name], MD   1/29/20255:35 PM  CHIEF COMPLAINT:   Diagnosis: locally advanced left breast  cancer    Cancer Staging  Breast cancer, left [facility]) Staging form: Breast, AJCC 8th Edition - Clinical stage from 10/15/2021: Stage IIA (cT3, cN1, cM0, G2, ER+, PR+, HER2+) - Unsigned    Prior Therapy: TCHP x 6 cycles   Current Therapy:  Anastrozole daily, trastuzumab and Herceptin every 21 days    HISTORY OF PRESENT ILLNESS:   Oncology History  Breast cancer, left (HCC)  10/15/2021 Initial Diagnosis   Breast cancer, left (HCC)   11/04/2021 - 04/22/2022 Chemotherapy   Patient is on Treatment Plan : BREAST  Docetaxel + Carboplatin + Trastuzumab + Pertuzumab  (TCHP) q21d       Genetic Testing   Negative genetic testing. No pathogenic variants identified on the [facility] Multi-Cancer+RNA panel. The report date is 11/02/2021.   The Multi-Cancer Panel + RNA offered by [facility] includes sequencing and/or deletion duplication testing of the following 84 genes: AIP, ALK, APC, ATM, AXIN2,BAP1,  BARD1, BLM, BMPR1A, BRCA1, BRCA2, BRIP1, CASR, CDC73, CDH1, CDK4, CDKN1B, CDKN1C, CDKN2A (p14ARF), CDKN2A (p16INK4a), CEBPA, CHEK2, CTNNA1, DICER1, DIS3L2, EGFR (c.2369C>T, p.Thr790Met variant only), EPCAM (Deletion/duplication testing only), FH, FLCN, GATA2, GPC3, GREM1 (Promoter region deletion/duplication testing only), HOXB13 (c.251G>A, p.Gly84Glu), HRAS, KIT, MAX, MEN1, MET, MITF (c.952G>A, p.Glu318Lys variant only), MLH1, MSH2, MSH3, MSH6, MUTYH, NBN, NF1, NF2, NTHL1, PALB2, PDGFRA, PHOX2B, PMS2, POLD1, POLE, POT1, PRKAR1A, PTCH1, PTEN, RAD50, RAD51C, RAD51D, RB1, RECQL4, RET, RUNX1, SDHAF2, SDHA (sequence changes only), SDHB, SDHC, SDHD, SMAD4, SMARCA4, SMARCB1, SMARCE1, STK11, SUFU, TERC, TERT, TMEM127, TP53, TSC1, TSC2, VHL, WRN and WT1.   11/04/2021 -  Chemotherapy   Patient is on Treatment Plan : BREAST  Docetaxel + Carboplatin + Trastuzumab + Pertuzumab  (TCHP) q21d / Trastuzumab + Pertuzumab q21d        INTERVAL HISTORY:   Traci Fuller is a 68 y.o. female presenting to clinic today for follow up of  locally advanced left breast cancer. She was last seen by me on 08/04/23.  Since her last visit, she underwent CT C/A/P on 09/09/23 that found: no evidence of new or progressive disease in the chest, abdomen or pelvis; stable scattered tiny pulmonary  nodules; similar presacral intermixed nodular soft tissue and macroscopic fat measuring 4 x 2.1 cm; similar submucosal fibrofatty infiltration of the distal sigmoid colon through the rectum; and stable 3 cm abdominal aortic aneurysm.  Today, she states that she is doing well overall. Her appetite level is at 100%. Her energy level is at 60%.   She is tolerating anastrozole well without any severe hot flashes. She reports diarrhea lasting 3 days after treatment, for which she does not need to take Imodium for. She denies any dyspnea when lying on her back She notes ankle swellings when she is on her feet for long periods of time.    She notes consistent decreased energy levels that have slightly improved since starting maintenance dosage of Herceptin and Perjeta. She is taking amlodipine as prescribed for blood pressure and gabapentin as prescribed. She states an intermittent burning sensation in a nerve in the right temporal area of her face.  PAST MEDICAL HISTORY:   Past Medical History: 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 DES to midLAD, 75% stenosis of RCA not treated   Cancer [facility])    Current smoker    Essential hypertension    Family history of bladder cancer    Family history of breast cancer    Family history of leukemia    Family history of uterine cancer    Hyperlipidemia LDL goal <70    Myocardial infarction (HCC)    PONV (postoperative nausea and vomiting)    Port-A-Cath in place 10/29/2021    Surgical History: Past Surgical History:  Procedure Laterality Date   ABDOMINAL HYSTERECTOMY     BREAST BIOPSY Bilateral 10/16/2021   BREAST BIOPSY WITH RADIO FREQUENCY LOCALIZER  Right 05/05/2022   Procedure: BREAST BIOPSY WITH RADIO FREQUENCY LOCALIZER;  Surgeon: Lucretia Roers, MD;  Location: [facility];  Service: General;  Laterality: Right;   CORONARY STENT INTERVENTION N/A 01/10/2018   Procedure: CORONARY STENT INTERVENTION;  Surgeon: Corky Crafts, MD;  Location: [facility] INVASIVE CV LAB;  Service: Cardiovascular;  Laterality: N/A;   LEFT HEART CATH AND CORONARY ANGIOGRAPHY N/A 01/10/2018   Procedure: LEFT HEART CATH AND CORONARY ANGIOGRAPHY;  Surgeon: Corky Crafts, MD;  Location: [facility] INVASIVE CV LAB;  Service: Cardiovascular;  Laterality: N/A;   MASTECTOMY MODIFIED RADICAL Left 05/05/2022   Procedure: MASTECTOMY MODIFIED RADICAL WITH RADIO FREQUENCY LOCALIZER;  Surgeon: Lucretia Roers, MD;  Location: [facility];  Service: General;  Laterality: Left;   PORTACATH PLACEMENT Right 10/26/2021   Procedure: INSERTION PORT-A-CATH;  Surgeon: Lucretia Roers, MD;  Location: [facility];  Service: General;  Laterality: Right;    Social History: Social History   Socioeconomic History   Marital status: Married    Spouse name: Not on file   Number of children: Not on file   Years of education: Not on file   Highest education level: Not on file  Occupational History   Not on file  Tobacco Use   Smoking status: Every Day    Current packs/day: 1.00    Average packs/day: 1 pack/day for 49.1 years (49.1 ttl pk-yrs)    Types: Cigarettes    Start date: 1976   Smokeless tobacco: Never  Vaping Use   Vaping status: Never Used  Substance and Sexual Activity   Alcohol use: Not Currently   Drug use: Never   Sexual activity: Yes  Other Topics Concern   Not on file  Social History  Narrative   Not on file   Social Drivers of Health   Financial Resource Strain: Not on file  Food Insecurity: No Food Insecurity (05/05/2022)   Hunger Vital Sign    Worried About Running Out of Food in the Last Year: Never true    Ran Out of Food in the Last Year: Never true   Transportation Needs: No Transportation Needs (05/05/2022)   PRAPARE - Administrator, Civil Service (Medical): No    Lack of Transportation (Non-Medical): No  Physical Activity: Not on file  Stress: Not on file  Social Connections: Unknown (12/06/2022)   [facility] from [facility]   Social Network    Social Network: Not on file  Intimate Partner Violence: Unknown (12/06/2022)   Received from [facility], [facility]   HITS    Physically Hurt: Not on file    Insult or Talk Down To: Not on file    Threaten Physical Harm: Not on file    Scream or Curse: Not on file    Family History: Family History  Problem Relation Age of Onset   Bladder Cancer Mother 109   Cervical cancer Mother        dx 61s, hysterectomy   Hypertension Father    Cirrhosis Father    Leukemia Brother 61   Liver cancer Brother 60   Breast cancer Maternal Aunt        dx 73s   Cancer Maternal Uncle        unk type d. 49   Cancer Paternal Aunt        unk type   Breast cancer Maternal Grandmother        dx 11s   Uterine cancer Maternal Grandmother     Current Medications:  Current Outpatient Medications:    ALPRAZolam (XANAX) 0.25 MG tablet, TAKE 1 TABLET(0.25 MG) BY MOUTH TWICE DAILY AS NEEDED FOR ANXIETY, Disp: 60 tablet, Rfl: 0   amLODipine (NORVASC) 10 MG tablet, Take 10 mg by mouth daily., Disp: , Rfl:    anastrozole (ARIMIDEX) 1 MG tablet, TAKE 1 TABLET(1 MG) BY MOUTH DAILY, Disp: 90 tablet, Rfl: 4   aspirin EC 81 MG tablet, Take 1 tablet (81 mg total) by mouth daily., Disp: 90 tablet, Rfl: 3   Calcium Carb-Cholecalciferol (CALCIUM 600/VITAMIN D3 PO), Take 1 tablet by mouth in the morning and at bedtime., Disp: , Rfl:    ELIQUIS 5 MG TABS tablet, TAKE 1 TABLET(5 MG) BY MOUTH TWICE DAILY, Disp: 60 tablet, Rfl: 5   Evolocumab (REPATHA SURECLICK) 140 MG/ML SOAJ, Inject 140 mg into the skin every 14 (fourteen) days., Disp: 2 mL, Rfl: 11   gabapentin (NEURONTIN) 300 MG  capsule, Take 300 mg by mouth 2 (two) times daily., Disp: , Rfl:    magnesium oxide (MAG-OX) 400 (240 Mg) MG tablet, TAKE 1 TABLET BY MOUTH EVERY MORNING, AT NOON, AND EVERY NIGHT AT BEDTIME., Disp: 90 tablet, Rfl: 3   metoprolol tartrate (LOPRESSOR) 25 MG tablet, Take 1 tablet (25 mg total) by mouth 2 (two) times daily. Please call and schedule overdue appointment with Dr. Allyson Sabal for further refills . SECOND ATTEMPT!, Disp: 30 tablet, Rfl: 0   mometasone (ELOCON) 0.1 % cream, Apply topically 2 (two) times daily., Disp: , Rfl:    omeprazole (PRILOSEC OTC) 20 MG tablet, Take 20 mg by mouth daily., Disp: , Rfl:    ondansetron (ZOFRAN-ODT) 4 MG disintegrating tablet, Take 1 tablet (4 mg total) by mouth every  6 (six) hours as needed for nausea., Disp: 20 tablet, Rfl: 0   oxyCODONE (OXY IR/ROXICODONE) 5 MG immediate release tablet, Take 1-2 tablets (5-10 mg total) by mouth every 4 (four) hours as needed for severe pain or breakthrough pain., Disp: 30 tablet, Rfl: 0   pegfilgrastim-bmez (ZIEXTENZO) 6 MG/0.6ML injection, Inject 6 mg into the skin every 21 ( twenty-one) days. AFTER chemotherapy., Disp: , Rfl:    Pertuzumab (PERJETA IV), Inject into the vein every 21 ( twenty-one) days., Disp: , Rfl:    sulfamethoxazole-trimethoprim (BACTRIM DS) 800-160 MG tablet, Take 1 tablet by mouth 2 (two) times daily., Disp: 14 tablet, Rfl: 0   trastuzumab-anns (KANJINTI) 150 MG SOLR injection, Inject 462 mg into the vein every 21 ( twenty-one) days., Disp: , Rfl:    Allergies: No Known Allergies  REVIEW OF SYSTEMS:   Review of Systems  Constitutional:  Negative for chills, fatigue and fever.  HENT:   Negative for lump/mass, mouth sores, nosebleeds, sore throat and trouble swallowing.   Eyes:  Negative for eye problems.  Respiratory:  Negative for cough and shortness of breath.   Cardiovascular:  Negative for chest pain, leg swelling and palpitations.  Gastrointestinal:  Negative for abdominal pain, constipation,  diarrhea, nausea and vomiting.  Genitourinary:  Positive for nocturia. Negative for bladder incontinence, difficulty urinating, dysuria, frequency and hematuria.   Musculoskeletal:  Negative for arthralgias, back pain, flank pain, myalgias and neck pain.  Skin:  Negative for itching and rash.  Neurological:  Positive for headaches. Negative for dizziness and numbness.       +tingling hands  Hematological:  Does not bruise/bleed easily.  Psychiatric/Behavioral:  Negative for depression, sleep disturbance and suicidal ideas. The patient is not nervous/anxious.   All other systems reviewed and are negative.    VITALS:   Height 5\' 1"  (1.549 m), weight 154 lb (69.9 kg).  Wt Readings from Last 3 Encounters:  09/15/23 154 lb (69.9 kg)  08/25/23 154 lb 11.2 oz (70.2 kg)  08/04/23 153 lb 12.8 oz (69.8 kg)    Body mass index is 29.1 kg/m.  Performance status (ECOG): 1 - Symptomatic but completely ambulatory  PHYSICAL EXAM:   Physical Exam Vitals and nursing note reviewed. Exam conducted with a chaperone present.  Constitutional:      Appearance: Normal appearance.  Cardiovascular:     Rate and Rhythm: Normal rate and regular rhythm.     Pulses: Normal pulses.     Heart sounds: Normal heart sounds.  Pulmonary:     Effort: Pulmonary effort is normal.     Breath sounds: Normal breath sounds.  Abdominal:     Palpations: Abdomen is soft. There is no hepatomegaly, splenomegaly or mass.     Tenderness: There is no abdominal tenderness.  Musculoskeletal:     Right lower leg: No edema.     Left lower leg: No edema.  Lymphadenopathy:     Cervical: No cervical adenopathy.     Right cervical: No superficial, deep or posterior cervical adenopathy.    Left cervical: No superficial, deep or posterior cervical adenopathy.     Upper Body:     Right upper body: No supraclavicular or axillary adenopathy.     Left upper body: No supraclavicular or axillary adenopathy.  Neurological:     General:  No focal deficit present.     Mental Status: She is alert and oriented to person, place, and time.  Psychiatric:        Mood and Affect:  Mood normal.        Behavior: Behavior normal.     LABS:      Latest Ref Rng & Units 09/15/2023    9:03 AM 08/25/2023   10:14 AM 08/04/2023    9:41 AM  CBC  WBC 4.0 - 10.5 K/uL 6.4  7.5  5.8   Hemoglobin 12.0 - 15.0 g/dL 16.1  09.6  04.5   Hematocrit 36.0 - 46.0 % 36.0  35.6  37.8   Platelets 150 - 400 K/uL 215  217  212       Latest Ref Rng & Units 09/15/2023    9:03 AM 08/25/2023   10:14 AM 08/04/2023    9:41 AM  CMP  Glucose 70 - 99 mg/dL 409  811  914   BUN 8 - 23 mg/dL 32  27  32   Creatinine 0.44 - 1.00 mg/dL 7.82  9.56  2.13   Sodium 135 - 145 mmol/L 135  136  136   Potassium 3.5 - 5.1 mmol/L 4.1  4.0  4.1   Chloride 98 - 111 mmol/L 104  106  106   CO2 22 - 32 mmol/L 23  22  22    Calcium 8.9 - 10.3 mg/dL 9.9  9.7  08.6   Total Protein 6.5 - 8.1 g/dL 6.6  6.9  7.0   Total Bilirubin 0.0 - 1.2 mg/dL 0.6  0.6  0.5   Alkaline Phos 38 - 126 U/L 127  100  104   AST 15 - 41 U/L 15  17  16    ALT 0 - 44 U/L 16  13  12       No results found for: "CEA1", "CEA" / No results found for: "CEA1", "CEA" No results found for: "PSA1" No results found for: "VHQ469" No results found for: "CAN125"  No results found for: "TOTALPROTELP", "ALBUMINELP", "A1GS", "A2GS", "BETS", "BETA2SER", "GAMS", "MSPIKE", "SPEI" Lab Results  Component Value Date   TIBC 335 09/15/2023   FERRITIN 25 09/15/2023   IRONPCTSAT 16 09/15/2023   No results found for: "LDH"   STUDIES:   ECHOCARDIOGRAM COMPLETE Result Date: 09/09/2023    ECHOCARDIOGRAM REPORT   Patient Name:   RANE DUMM Unterreiner Date of Exam: 09/09/2023 Medical Rec #:  [id number]       Height:       61.0 in Accession #:    [id number]      Weight:       154.7 lb Date of Birth:  [birth year]       BSA:          1.693 m Patient Age:    67 years        BP:           131/77 mmHg Patient Gender: F               HR:            58 bpm. [facility] Procedure: 2D Echo, 3D Echo, Cardiac Doppler, Color Doppler and Strain Analysis Indications:    Z51.11 Encounter for antineoplastic chemotheraphy  History:        Patient has prior history of Echocardiogram examinations, most                 recent 04/11/2023. Previous Myocardial Infarction and CAD, Prior                 CABG, Signs/Symptoms:Chest Pain; Risk Factors:Hypertension,  Current Smoker and Dyslipidemia. Metastatic beast cancer. Chemo.  Sonographer:    Sheralyn Boatman RDCS Referring Phys: [id number] [facility] IMPRESSIONS  1. Left ventricular ejection fraction, by estimation, is 55 to 60%. The left ventricle has normal function. The left ventricle has no regional wall motion abnormalities. Left ventricular diastolic parameters are indeterminate. The average left ventricular global longitudinal strain is -20.7 %. The global longitudinal strain is normal.  2. Right ventricular systolic function is normal. The right ventricular size is normal. There is normal pulmonary artery systolic pressure.  3. The mitral valve is normal in structure. Trivial mitral valve regurgitation. No evidence of mitral stenosis.  4. The tricuspid valve is abnormal.  5. The aortic valve is tricuspid. There is mild calcification of the aortic valve. There is mild thickening of the aortic valve. Aortic valve regurgitation is not visualized. No aortic stenosis is present.  6. The inferior vena cava is normal in size with greater than 50% respiratory variability, suggesting right atrial pressure of 3 mmHg. FINDINGS  Left Ventricle: Left ventricular ejection fraction, by estimation, is 55 to 60%. The left ventricle has normal function. The left ventricle has no regional wall motion abnormalities. The average left ventricular global longitudinal strain is -20.7 %. The global longitudinal strain is normal. The left ventricular internal cavity size was normal in size. There is no left  ventricular hypertrophy. Left ventricular diastolic parameters are indeterminate. Right Ventricle: The right ventricular size is normal. Right vetricular wall thickness was not well visualized. Right ventricular systolic function is normal. There is normal pulmonary artery systolic pressure. The tricuspid regurgitant velocity is 2.36 m/s, and with an assumed right atrial pressure of 3 mmHg, the estimated right ventricular systolic pressure is 25.3 mmHg. Left Atrium: Left atrial size was normal in size. Right Atrium: Right atrial size was normal in size. Pericardium: There is no evidence of pericardial effusion. Mitral Valve: The mitral valve is normal in structure. Trivial mitral valve regurgitation. No evidence of mitral valve stenosis. Tricuspid Valve: The tricuspid valve is abnormal. Tricuspid valve regurgitation is mild . No evidence of tricuspid stenosis. Aortic Valve: The aortic valve is tricuspid. There is mild calcification of the aortic valve. There is mild thickening of the aortic valve. There is mild aortic valve annular calcification. Aortic valve regurgitation is not visualized. No aortic stenosis  is present. Aortic valve mean gradient measures 2.7 mmHg. Aortic valve peak gradient measures 5.5 mmHg. Aortic valve area, by VTI measures 2.56 cm. Pulmonic Valve: The pulmonic valve was not well visualized. Pulmonic valve regurgitation is not visualized. No evidence of pulmonic stenosis. Aorta: The aortic root and ascending aorta are structurally normal, with no evidence of dilitation. Venous: The inferior vena cava is normal in size with greater than 50% respiratory variability, suggesting right atrial pressure of 3 mmHg. IAS/Shunts: No atrial level shunt detected by color flow Doppler.  LEFT VENTRICLE PLAX 2D LVIDd:         4.30 cm     Diastology LVIDs:         3.00 cm     LV e' medial:    7.07 cm/s LV PW:         1.00 cm     LV E/e' medial:  7.6 LV IVS:        1.10 cm     LV e' lateral:   8.70 cm/s LVOT  diam:     2.10 cm     LV E/e' lateral: 6.2 LV SV:  63 LV SV Index:   37          2D Longitudinal Strain LVOT Area:     3.46 cm    2D Strain GLS Avg:     -20.7 %  LV Volumes (MOD) LV vol d, MOD A2C: 69.2 ml 3D Volume EF: LV vol d, MOD A4C: 54.9 ml 3D EF:        59 % LV vol s, MOD A2C: 33.3 ml LV EDV:       124 ml LV vol s, MOD A4C: 19.6 ml LV ESV:       50 ml LV SV MOD A2C:     35.9 ml LV SV:        74 ml LV SV MOD A4C:     54.9 ml LV SV MOD BP:      37.0 ml RIGHT VENTRICLE            IVC RV S prime:     8.59 cm/s  IVC diam: 1.50 cm TAPSE (M-mode): 1.9 cm LEFT ATRIUM             Index        RIGHT ATRIUM           Index LA diam:        3.50 cm 2.07 cm/m   RA Area:     10.90 cm LA Vol (A2C):   44.8 ml 26.45 ml/m  RA Volume:   20.30 ml  11.99 ml/m LA Vol (A4C):   35.1 ml 20.73 ml/m LA Biplane Vol: 41.5 ml 24.51 ml/m  AORTIC VALVE AV Area (Vmax):    2.37 cm AV Area (Vmean):   2.32 cm AV Area (VTI):     2.56 cm AV Vmax:           117.28 cm/s AV Vmean:          76.771 cm/s AV VTI:            0.246 m AV Peak Grad:      5.5 mmHg AV Mean Grad:      2.7 mmHg LVOT Vmax:         80.40 cm/s LVOT Vmean:        51.400 cm/s LVOT VTI:          0.182 m LVOT/AV VTI ratio: 0.74  AORTA Ao Root diam: 3.40 cm Ao Asc diam:  3.20 cm MITRAL VALVE               TRICUSPID VALVE MV Area (PHT): 2.76 cm    TR Peak grad:   22.3 mmHg MV Decel Time: 275 msec    TR Vmax:        236.00 cm/s MV E velocity: 54.00 cm/s MV A velocity: 52.30 cm/s  SHUNTS MV E/A ratio:  1.03        Systemic VTI:  0.18 m                            Systemic Diam: 2.10 cm Dina Rich MD Electronically signed by Dina Rich MD Signature Date/Time: 09/09/2023/12:45:10 PM    Final    CT CHEST ABDOMEN PELVIS W CONTRAST Result Date: 09/09/2023 CLINICAL DATA:  History of breast cancer, follow-up. * Tracking Code: BO * EXAM: CT CHEST, ABDOMEN, AND PELVIS WITH CONTRAST TECHNIQUE: Multidetector CT imaging of the chest, abdomen and pelvis was performed  following the standard protocol during bolus administration of intravenous contrast.  RADIATION DOSE REDUCTION: This exam was performed according to the departmental dose-optimization program which includes automated exposure control, adjustment of the mA and/or kV according to patient size and/or use of iterative reconstruction technique. CONTRAST:  80mL OMNIPAQUE IOHEXOL 300 MG/ML  SOLN COMPARISON:  Multiple priors including most recent CT April 18, 2023 FINDINGS: CT CHEST FINDINGS Cardiovascular: Right chest Port-A-Cath tip near the superior cavoatrial junction. Aortic atherosclerosis. Coronary artery calcifications. Normal size heart. No significant pericardial effusion/thickening. Mediastinum/Nodes: No suspicious thyroid nodule. No pathologically enlarged mediastinal, hilar or axillary lymph nodes. Left axillary surgical clips with similar ill-defined soft tissue stranding favored postsurgical. The esophagus is grossly unremarkable Lungs/Pleura: Subpleural reticulations in the left anterior upper lobe are similar prior examination. Stable scattered tiny pulmonary nodules.  For reference: -right lower lobe pulmonary nodule measures 5 mm on image 86/3, unchanged. -subpleural left lower lobe pulmonary nodule measures 5 mm on image 103/3, unchanged. No new suspicious pulmonary nodules or masses. Musculoskeletal: Prior left mastectomy. No aggressive lytic or blastic lesion of bone. CT ABDOMEN PELVIS FINDINGS Hepatobiliary: No suspicious hepatic lesion. Gallbladder is unremarkable. No biliary ductal dilation. Pancreas: No pancreatic ductal dilation or evidence of acute inflammation. Spleen: No splenomegaly Adrenals/Urinary Tract: Similar nodular thickening of the adrenal glands. No hydronephrosis. Kidneys demonstrate symmetric enhancement. Urinary bladder is unremarkable for degree of distension. Stomach/Bowel: Stomach is non distended limiting evaluation. No pathologic dilation of small or large bowel. Similar  submucosal fibrofatty infiltration of the distal sigmoid colon through the rectum. Vascular/Lymphatic: Aorto bi-iliac atherosclerosis. Abdominal aortic aneurysm measuring 3 cm, unchanged. No pathologically enlarged abdominal or pelvic lymph nodes. Reproductive: Status post hysterectomy. No adnexal masses. Other: Similar presacral intermixed nodular soft tissue and macroscopic fat measuring 4 x 2.1 cm on image 96/2, present dating back to at least October 22, 2021 with a minimal metabolism similar to that of background blood pool on that PET-CT. Musculoskeletal: No aggressive lytic or blastic lesion of bone. IMPRESSION: 1. No evidence of new or progressive disease in the chest, abdomen or pelvis. 2. Stable scattered tiny pulmonary nodules. 3. Similar presacral intermixed nodular soft tissue and macroscopic fat measuring 4 x 2.1 cm, present dating back to at least October 22, 2021 with a minimal metabolism similar to that of background blood pool on that PET-CT. Findings are favored to reflect a benign process such as extramedullary hematopoiesis, myelolipoma or atypical lipomatous tumor. Continued attention on follow-up imaging suggested. 4. Similar submucosal fibrofatty infiltration of the distal sigmoid colon through the rectum, which can be seen in the setting of chronic inflammatory bowel disease. 5. Stable 3 cm abdominal aortic aneurysm. Recommend follow-up ultrasound every 3 years. 6.  Aortic Atherosclerosis (ICD10-I70.0). Electronically Signed   By: Maudry Mayhew M.D.   On: 09/09/2023 12:30

## 2023-09-15 ENCOUNTER — Inpatient Hospital Stay: Payer: Managed Care, Other (non HMO)

## 2023-09-15 ENCOUNTER — Inpatient Hospital Stay: Payer: Managed Care, Other (non HMO) | Admitting: Hematology

## 2023-09-15 VITALS — Ht 61.0 in | Wt 154.0 lb

## 2023-09-15 VITALS — BP 134/86 | HR 57 | Temp 97.6°F | Resp 18

## 2023-09-15 DIAGNOSIS — Z5112 Encounter for antineoplastic immunotherapy: Secondary | ICD-10-CM | POA: Diagnosis not present

## 2023-09-15 DIAGNOSIS — D508 Other iron deficiency anemias: Secondary | ICD-10-CM

## 2023-09-15 DIAGNOSIS — R530 Neoplastic (malignant) related fatigue: Secondary | ICD-10-CM | POA: Diagnosis not present

## 2023-09-15 DIAGNOSIS — C50919 Malignant neoplasm of unspecified site of unspecified female breast: Secondary | ICD-10-CM

## 2023-09-15 DIAGNOSIS — C50812 Malignant neoplasm of overlapping sites of left female breast: Secondary | ICD-10-CM | POA: Diagnosis not present

## 2023-09-15 DIAGNOSIS — Z170 Estrogen receptor positive status [ER+]: Secondary | ICD-10-CM

## 2023-09-15 DIAGNOSIS — Z95828 Presence of other vascular implants and grafts: Secondary | ICD-10-CM

## 2023-09-15 DIAGNOSIS — C50912 Malignant neoplasm of unspecified site of left female breast: Secondary | ICD-10-CM

## 2023-09-15 LAB — CBC WITH DIFFERENTIAL/PLATELET
Abs Immature Granulocytes: 0.05 10*3/uL (ref 0.00–0.07)
Basophils Absolute: 0 10*3/uL (ref 0.0–0.1)
Basophils Relative: 1 %
Eosinophils Absolute: 0.3 10*3/uL (ref 0.0–0.5)
Eosinophils Relative: 4 %
HCT: 36 % (ref 36.0–46.0)
Hemoglobin: 11.4 g/dL — ABNORMAL LOW (ref 12.0–15.0)
Immature Granulocytes: 1 %
Lymphocytes Relative: 13 %
Lymphs Abs: 0.8 10*3/uL (ref 0.7–4.0)
MCH: 30.2 pg (ref 26.0–34.0)
MCHC: 31.7 g/dL (ref 30.0–36.0)
MCV: 95.2 fL (ref 80.0–100.0)
Monocytes Absolute: 0.5 10*3/uL (ref 0.1–1.0)
Monocytes Relative: 7 %
Neutro Abs: 4.8 10*3/uL (ref 1.7–7.7)
Neutrophils Relative %: 74 %
Platelets: 215 10*3/uL (ref 150–400)
RBC: 3.78 MIL/uL — ABNORMAL LOW (ref 3.87–5.11)
RDW: 15.7 % — ABNORMAL HIGH (ref 11.5–15.5)
WBC: 6.4 10*3/uL (ref 4.0–10.5)
nRBC: 0 % (ref 0.0–0.2)

## 2023-09-15 LAB — COMPREHENSIVE METABOLIC PANEL
ALT: 16 U/L (ref 0–44)
AST: 15 U/L (ref 15–41)
Albumin: 3.6 g/dL (ref 3.5–5.0)
Alkaline Phosphatase: 127 U/L — ABNORMAL HIGH (ref 38–126)
Anion gap: 8 (ref 5–15)
BUN: 32 mg/dL — ABNORMAL HIGH (ref 8–23)
CO2: 23 mmol/L (ref 22–32)
Calcium: 9.9 mg/dL (ref 8.9–10.3)
Chloride: 104 mmol/L (ref 98–111)
Creatinine, Ser: 1.53 mg/dL — ABNORMAL HIGH (ref 0.44–1.00)
GFR, Estimated: 37 mL/min — ABNORMAL LOW (ref >60)
Glucose, Bld: 133 mg/dL — ABNORMAL HIGH (ref 70–99)
Potassium: 4.1 mmol/L (ref 3.5–5.1)
Sodium: 135 mmol/L (ref 135–145)
Total Bilirubin: 0.6 mg/dL (ref 0.0–1.2)
Total Protein: 6.6 g/dL (ref 6.5–8.1)

## 2023-09-15 LAB — MAGNESIUM: Magnesium: 1.8 mg/dL (ref 1.7–2.4)

## 2023-09-15 LAB — IRON AND TIBC
Iron: 54 ug/dL (ref 28–170)
Saturation Ratios: 16 % (ref 10.4–31.8)
TIBC: 335 ug/dL (ref 250–450)
UIBC: 281 ug/dL

## 2023-09-15 LAB — FERRITIN: Ferritin: 25 ng/mL (ref 11–307)

## 2023-09-15 LAB — TSH: TSH: 2.323 u[IU]/mL (ref 0.350–4.500)

## 2023-09-15 MED ORDER — SODIUM CHLORIDE 0.9% FLUSH
10.0000 mL | INTRAVENOUS | Status: DC | PRN
Start: 2023-09-15 — End: 2023-09-15
  Administered 2023-09-15: 10 mL

## 2023-09-15 MED ORDER — SODIUM CHLORIDE 0.9 % IV SOLN
420.0000 mg | Freq: Once | INTRAVENOUS | Status: AC
  Administered 2023-09-15: 420 mg via INTRAVENOUS
  Filled 2023-09-15: qty 14

## 2023-09-15 MED ORDER — HEPARIN SOD (PORK) LOCK FLUSH 100 UNIT/ML IV SOLN
500.0000 [IU] | Freq: Once | INTRAVENOUS | Status: AC | PRN
  Administered 2023-09-15: 500 [IU]

## 2023-09-15 MED ORDER — SODIUM CHLORIDE 0.9% FLUSH
10.0000 mL | INTRAVENOUS | Status: DC | PRN
  Administered 2023-09-15: 10 mL via INTRAVENOUS

## 2023-09-15 MED ORDER — CETIRIZINE HCL 10 MG PO TABS
10.0000 mg | ORAL_TABLET | Freq: Once | ORAL | Status: DC
Start: 2023-09-15 — End: 2023-09-15

## 2023-09-15 MED ORDER — SODIUM CHLORIDE 0.9 % IV SOLN: Freq: Once | INTRAVENOUS | Status: AC

## 2023-09-15 MED ORDER — TRASTUZUMAB-ANNS CHEMO 150 MG IV SOLR
6.0000 mg/kg | Freq: Once | INTRAVENOUS | Status: AC
  Administered 2023-09-15: 420 mg via INTRAVENOUS
  Filled 2023-09-15: qty 20

## 2023-09-15 MED ORDER — ACETAMINOPHEN 325 MG PO TABS: 650.0000 mg | ORAL_TABLET | Freq: Once | ORAL | Status: DC

## 2023-09-15 NOTE — Patient Instructions (Signed)
[facility] at [facility] Discharge Instructions   You were seen and examined today by Dr. Ellin Saba.  He reviewed the results of your lab work which are normal/stable.   He reviewed the results of your CT scan which is stable. The cancer has not grown or spread.   We will proceed with your treatment today.   Return as scheduled.    Thank you for choosing [facility] at [facility] to provide your oncology and hematology care.  To afford each patient quality time with our provider, please arrive at least 15 minutes before your scheduled appointment time.   If you have a lab appointment with the Cancer Center please come in thru the Main Entrance and check in at the main information desk.  You need to re-schedule your appointment should you arrive 10 or more minutes late.  We strive to give you quality time with our providers, and arriving late affects you and other patients whose appointments are after yours.  Also, if you no show three or more times for appointments you may be dismissed from the clinic at the providers discretion.     Again, thank you for choosing [facility].  Our hope is that these requests will decrease the amount of time that you wait before being seen by our physicians.       _____________________________________________________________  Should you have questions after your visit to [facility], please contact our office at [phone] and follow the prompts.  Our office hours are 8:00 a.m. and 4:30 p.m. Monday - Friday.  Please note that voicemails left after 4:00 p.m. may not be returned until the following business day.  We are closed weekends and major holidays.  You do have access to a nurse 24-7, just call the main number to the clinic [phone] and do not press any options, hold on the line and a nurse will answer the phone.    For prescription refill requests, have your pharmacy  contact our office and allow 72 hours.    Due to Covid, you will need to wear a mask upon entering the hospital. If you do not have a mask, a mask will be given to you at the Main Entrance upon arrival. For doctor visits, patients may have 1 support person age 45 or older with them. For treatment visits, patients can not have anyone with them due to social distancing guidelines and our immunocompromised population.

## 2023-09-15 NOTE — Progress Notes (Signed)
Patient presents today for Kanjinti/Perjeta infusion per providers order.  Vital signs and labs reviewed by MD.  Message received from Chapman Moss RN/Dr. Ellin Saba patient okay for treatment.  Patient took premedications at home prior to appt.  Treatment given today per MD orders.  Stable during infusion without adverse affects.  Vital signs stable.  No complaints at this time.  Discharge from clinic ambulatory in stable condition.  Alert and oriented X 3.  Follow up with [facility] as scheduled.

## 2023-09-15 NOTE — Progress Notes (Signed)
Patient has been examined by Dr. Ellin Saba. Vital signs and labs have been reviewed by MD - ANC, Creatinine (1.53), LFTs, hemoglobin, and platelets are within treatment parameters per M.D. - pt may proceed with treatment.  Primary RN and pharmacy notified.

## 2023-09-15 NOTE — Patient Instructions (Signed)
 [facility] [place] - A [facility] OF [facility]  Discharge Instructions: Thank you for choosing [facility] to provide your oncology and hematology care.  If you have a lab appointment with the Cancer Center - please note that after April 8th, 2024, all labs will be drawn in the cancer center.  You do not have to check in or register with the main entrance as you have in the past but will complete your check-in in the cancer center.  Wear comfortable clothing and clothing appropriate for easy access to any Portacath or PICC line.   We strive to give you quality time with your provider. You may need to reschedule your appointment if you arrive late (15 or more minutes).  Arriving late affects you and other patients whose appointments are after yours.  Also, if you miss three or more appointments without notifying the office, you may be dismissed from the clinic at the provider's discretion.      For prescription refill requests, have your pharmacy contact our office and allow 72 hours for refills to be completed.    Today you received the following chemotherapy and/or immunotherapy agents Kanjinti/perjeta      To help prevent nausea and vomiting after your treatment, we encourage you to take your nausea medication as directed.  BELOW ARE SYMPTOMS THAT SHOULD BE REPORTED IMMEDIATELY: *FEVER GREATER THAN 100.4 F (38 C) OR HIGHER *CHILLS OR SWEATING *NAUSEA AND VOMITING THAT IS NOT CONTROLLED WITH YOUR NAUSEA MEDICATION *UNUSUAL SHORTNESS OF BREATH *UNUSUAL BRUISING OR BLEEDING *URINARY PROBLEMS (pain or burning when urinating, or frequent urination) *BOWEL PROBLEMS (unusual diarrhea, constipation, pain near the anus) TENDERNESS IN MOUTH AND THROAT WITH OR WITHOUT PRESENCE OF ULCERS (sore throat, sores in mouth, or a toothache) UNUSUAL RASH, SWELLING OR PAIN  UNUSUAL VAGINAL DISCHARGE OR ITCHING   Items with * indicate a potential emergency and should be  followed up as soon as possible or go to the Emergency Department if any problems should occur.  Please show the CHEMOTHERAPY ALERT CARD or IMMUNOTHERAPY ALERT CARD at check-in to the Emergency Department and triage nurse.  Should you have questions after your visit or need to cancel or reschedule your appointment, please contact [facility] [place] - A DEPT OF [facility] [phone]  and follow the prompts.  Office hours are 8:00 a.m. to 4:30 p.m. Monday - Friday. Please note that voicemails left after 4:00 p.m. may not be returned until the following business day.  We are closed weekends and major holidays. You have access to a nurse at all times for urgent questions. Please call the main number to the clinic [phone] and follow the prompts.  For any non-urgent questions, you may also contact your provider using MyChart. We now offer e-Visits for anyone 24 and older to request care online for non-urgent symptoms. For details visit [email or web address].   Also download the MyChart app! Go to the app store, search "MyChart", open the app, select [place], and log in with your MyChart username and password.

## 2023-09-16 ENCOUNTER — Other Ambulatory Visit: Payer: Self-pay

## 2023-09-17 ENCOUNTER — Other Ambulatory Visit: Payer: Self-pay

## 2023-09-21 ENCOUNTER — Encounter (HOSPITAL_COMMUNITY): Payer: Self-pay | Admitting: Hematology

## 2023-09-21 ENCOUNTER — Encounter: Payer: Self-pay | Admitting: Hematology

## 2023-09-21 NOTE — Addendum Note (Signed)
Addended by: [name] on: 09/21/2023 05:37 PM   Modules accepted: Orders

## 2023-09-22 ENCOUNTER — Inpatient Hospital Stay: Payer: Managed Care, Other (non HMO)

## 2023-09-22 VITALS — BP 129/79 | HR 64 | Temp 97.1°F | Resp 18

## 2023-09-22 DIAGNOSIS — D508 Other iron deficiency anemias: Secondary | ICD-10-CM

## 2023-09-22 DIAGNOSIS — Z170 Estrogen receptor positive status [ER+]: Secondary | ICD-10-CM

## 2023-09-22 DIAGNOSIS — Z5112 Encounter for antineoplastic immunotherapy: Secondary | ICD-10-CM | POA: Diagnosis not present

## 2023-09-22 MED ORDER — HEPARIN SOD (PORK) LOCK FLUSH 100 UNIT/ML IV SOLN
500.0000 [IU] | Freq: Once | INTRAVENOUS | Status: AC
  Administered 2023-09-22: 500 [IU] via INTRAVENOUS

## 2023-09-22 MED ORDER — SODIUM CHLORIDE 0.9% FLUSH
10.0000 mL | INTRAVENOUS | Status: DC | PRN
  Administered 2023-09-22: 10 mL via INTRAVENOUS

## 2023-09-22 MED ORDER — CETIRIZINE HCL 10 MG/ML IV SOLN
10.0000 mg | Freq: Once | INTRAVENOUS | Status: DC
Start: 2023-09-22 — End: 2023-09-22

## 2023-09-22 MED ORDER — IRON SUCROSE 500 MG IVPB - SIMPLE MED: 500.0000 mg | Freq: Once | INTRAVENOUS | Status: DC

## 2023-09-22 MED ORDER — SODIUM CHLORIDE 0.9% FLUSH: 3.0000 mL | Freq: Once | INTRAVENOUS | Status: DC | PRN

## 2023-09-22 MED ORDER — SODIUM CHLORIDE 0.9 % IV SOLN
500.0000 mg | Freq: Once | INTRAVENOUS | Status: AC
Start: 2023-09-22 — End: 2023-09-22
  Administered 2023-09-22: 500 mg via INTRAVENOUS
  Filled 2023-09-22: qty 20

## 2023-09-22 MED ORDER — ALTEPLASE 2 MG IJ SOLR: 2.0000 mg | Freq: Once | INTRAMUSCULAR | Status: DC | PRN

## 2023-09-22 MED ORDER — ACETAMINOPHEN 325 MG PO TABS
650.0000 mg | ORAL_TABLET | Freq: Once | ORAL | Status: AC
  Administered 2023-09-22: 650 mg via ORAL
  Filled 2023-09-22: qty 2

## 2023-09-22 MED ORDER — HEPARIN SOD (PORK) LOCK FLUSH 100 UNIT/ML IV SOLN: 250.0000 [IU] | Freq: Once | INTRAVENOUS | Status: DC | PRN

## 2023-09-22 MED ORDER — SODIUM CHLORIDE 0.9 % IV SOLN: 500.0000 mg | Freq: Once | INTRAVENOUS | Status: DC

## 2023-09-22 MED ORDER — SODIUM CHLORIDE 0.9 % IV SOLN: INTRAVENOUS | Status: DC

## 2023-09-22 NOTE — Patient Instructions (Signed)

## 2023-09-22 NOTE — Progress Notes (Signed)
Patient tolerated iron infusion with no complaints voiced.  Port IV site clean and dry with good blood return noted before and after infusion.  Band aid applied.  VSS with discharge and left in satisfactory condition with no s/s of distress noted.

## 2023-09-29 ENCOUNTER — Inpatient Hospital Stay: Payer: Managed Care, Other (non HMO)

## 2023-10-04 ENCOUNTER — Inpatient Hospital Stay: Payer: Managed Care, Other (non HMO) | Attending: Oncology

## 2023-10-06 ENCOUNTER — Inpatient Hospital Stay: Payer: Managed Care, Other (non HMO)

## 2023-10-06 ENCOUNTER — Inpatient Hospital Stay: Payer: Managed Care, Other (non HMO) | Attending: Oncology

## 2023-10-06 VITALS — BP 133/76 | HR 64 | Temp 97.8°F | Resp 18 | Wt 151.2 lb

## 2023-10-06 DIAGNOSIS — Z170 Estrogen receptor positive status [ER+]: Secondary | ICD-10-CM | POA: Diagnosis not present

## 2023-10-06 DIAGNOSIS — C50812 Malignant neoplasm of overlapping sites of left female breast: Secondary | ICD-10-CM

## 2023-10-06 DIAGNOSIS — D508 Other iron deficiency anemias: Secondary | ICD-10-CM | POA: Insufficient documentation

## 2023-10-06 DIAGNOSIS — Z95828 Presence of other vascular implants and grafts: Secondary | ICD-10-CM

## 2023-10-06 DIAGNOSIS — C50412 Malignant neoplasm of upper-outer quadrant of left female breast: Secondary | ICD-10-CM | POA: Diagnosis present

## 2023-10-06 DIAGNOSIS — Z79811 Long term (current) use of aromatase inhibitors: Secondary | ICD-10-CM | POA: Diagnosis not present

## 2023-10-06 DIAGNOSIS — Z5112 Encounter for antineoplastic immunotherapy: Secondary | ICD-10-CM | POA: Diagnosis present

## 2023-10-06 DIAGNOSIS — C50212 Malignant neoplasm of upper-inner quadrant of left female breast: Secondary | ICD-10-CM | POA: Diagnosis present

## 2023-10-06 DIAGNOSIS — E860 Dehydration: Secondary | ICD-10-CM

## 2023-10-06 LAB — COMPREHENSIVE METABOLIC PANEL
ALT: 19 U/L (ref 0–44)
AST: 25 U/L (ref 15–41)
Albumin: 3.8 g/dL (ref 3.5–5.0)
Alkaline Phosphatase: 148 U/L — ABNORMAL HIGH (ref 38–126)
Anion gap: 7 (ref 5–15)
BUN: 28 mg/dL — ABNORMAL HIGH (ref 8–23)
CO2: 23 mmol/L (ref 22–32)
Calcium: 10.2 mg/dL (ref 8.9–10.3)
Chloride: 108 mmol/L (ref 98–111)
Creatinine, Ser: 1.61 mg/dL — ABNORMAL HIGH (ref 0.44–1.00)
GFR, Estimated: 35 mL/min — ABNORMAL LOW (ref >60)
Glucose, Bld: 107 mg/dL — ABNORMAL HIGH (ref 70–99)
Potassium: 4 mmol/L (ref 3.5–5.1)
Sodium: 138 mmol/L (ref 135–145)
Total Bilirubin: 0.5 mg/dL (ref 0.0–1.2)
Total Protein: 7.3 g/dL (ref 6.5–8.1)

## 2023-10-06 LAB — CBC WITH DIFFERENTIAL/PLATELET
Abs Immature Granulocytes: 0.03 10*3/uL (ref 0.00–0.07)
Basophils Absolute: 0 10*3/uL (ref 0.0–0.1)
Basophils Relative: 1 %
Eosinophils Absolute: 0.1 10*3/uL (ref 0.0–0.5)
Eosinophils Relative: 3 %
HCT: 37.1 % (ref 36.0–46.0)
Hemoglobin: 11.8 g/dL — ABNORMAL LOW (ref 12.0–15.0)
Immature Granulocytes: 1 %
Lymphocytes Relative: 26 %
Lymphs Abs: 1.1 10*3/uL (ref 0.7–4.0)
MCH: 30.4 pg (ref 26.0–34.0)
MCHC: 31.8 g/dL (ref 30.0–36.0)
MCV: 95.6 fL (ref 80.0–100.0)
Monocytes Absolute: 0.4 10*3/uL (ref 0.1–1.0)
Monocytes Relative: 9 %
Neutro Abs: 2.5 10*3/uL (ref 1.7–7.7)
Neutrophils Relative %: 60 %
Platelets: 159 10*3/uL (ref 150–400)
RBC: 3.88 MIL/uL (ref 3.87–5.11)
RDW: 15.9 % — ABNORMAL HIGH (ref 11.5–15.5)
WBC: 4.2 10*3/uL (ref 4.0–10.5)
nRBC: 0 % (ref 0.0–0.2)

## 2023-10-06 LAB — MAGNESIUM: Magnesium: 1.8 mg/dL (ref 1.7–2.4)

## 2023-10-06 MED ORDER — CETIRIZINE HCL 10 MG PO TABS: 10.0000 mg | ORAL_TABLET | Freq: Once | ORAL | Status: DC

## 2023-10-06 MED ORDER — SODIUM CHLORIDE 0.9 % IV SOLN: Freq: Once | INTRAVENOUS | Status: AC

## 2023-10-06 MED ORDER — SODIUM CHLORIDE 0.9 % IV SOLN: INTRAVENOUS | Status: DC

## 2023-10-06 MED ORDER — SODIUM CHLORIDE 0.9% FLUSH
10.0000 mL | INTRAVENOUS | Status: DC | PRN
Start: 2023-10-06 — End: 2023-10-06
  Administered 2023-10-06: 10 mL

## 2023-10-06 MED ORDER — HEPARIN SOD (PORK) LOCK FLUSH 100 UNIT/ML IV SOLN
500.0000 [IU] | Freq: Once | INTRAVENOUS | Status: AC | PRN
  Administered 2023-10-06: 500 [IU]

## 2023-10-06 MED ORDER — TRASTUZUMAB-ANNS CHEMO 420 MG IV SOLR
6.0000 mg/kg | Freq: Once | INTRAVENOUS | Status: AC
  Administered 2023-10-06: 420 mg via INTRAVENOUS
  Filled 2023-10-06: qty 20

## 2023-10-06 MED ORDER — SODIUM CHLORIDE 0.9 % IV SOLN
420.0000 mg | Freq: Once | INTRAVENOUS | Status: AC
  Administered 2023-10-06: 420 mg via INTRAVENOUS
  Filled 2023-10-06: qty 14

## 2023-10-06 MED ORDER — ACETAMINOPHEN 325 MG PO TABS: 650.0000 mg | ORAL_TABLET | Freq: Once | ORAL | Status: DC

## 2023-10-06 NOTE — Patient Instructions (Signed)
[facility]  [place] - A [facility]PT OF [facility]  Discharge Instructions: Thank you for choosing [facility] to provide your oncology and hematology care.  If you have a lab appointment with the Cancer Center - please note that after April 8th, 2024, all labs will be drawn in the cancer center.  You do not have to check in or register with the main entrance as you have in the past but will complete your check-in in the cancer center.  Wear comfortable clothing and clothing appropriate for easy access to any Portacath or PICC line.   We strive to give you quality time with your provider. You may need to reschedule your appointment if you arrive late (15 or more minutes).  Arriving late affects you and other patients whose appointments are after yours.  Also, if you miss three or more appointments without notifying the office, you may be dismissed from the clinic at the provider's discretion.      For prescription refill requests, have your pharmacy contact our office and allow 72 hours for refills to be completed.    Today you received the following chemotherapy and/or immunotherapy agents Kanjinti and Perjeta, return as scheduled.   To help prevent nausea and vomiting after your treatment, we encourage you to take your nausea medication as directed.  BELOW ARE SYMPTOMS THAT SHOULD BE REPORTED IMMEDIATELY: *FEVER GREATER THAN 100.4 F (38 C) OR HIGHER *CHILLS OR SWEATING *NAUSEA AND VOMITING THAT IS NOT CONTROLLED WITH YOUR NAUSEA MEDICATION *UNUSUAL SHORTNESS OF BREATH *UNUSUAL BRUISING OR BLEEDING *URINARY PROBLEMS (pain or burning when urinating, or frequent urination) *BOWEL PROBLEMS (unusual diarrhea, constipation, pain near the anus) TENDERNESS IN MOUTH AND THROAT WITH OR WITHOUT PRESENCE OF ULCERS (sore throat, sores in mouth, or a toothache) UNUSUAL RASH, SWELLING OR PAIN  UNUSUAL VAGINAL DISCHARGE OR ITCHING   Items with * indicate a potential  emergency and should be followed up as soon as possible or go to the Emergency Department if any problems should occur.  Please show the CHEMOTHERAPY ALERT CARD or IMMUNOTHERAPY ALERT CARD at check-in to the Emergency Department and triage nurse.  Should you have questions after your visit or need to cancel or reschedule your appointment, please contact [facility] [place] - A DEPT OF [facility] [phone]  and follow the prompts.  Office hours are 8:00 a.m. to 4:30 p.m. Monday - Friday. Please note that voicemails left after 4:00 p.m. may not be returned until the following business day.  We are closed weekends and major holidays. You have access to a nurse at all times for urgent questions. Please call the main number to the clinic [phone] and follow the prompts.  For any non-urgent questions, you may also contact your provider using MyChart. We now offer e-Visits for anyone 32 and older to request care online for non-urgent symptoms. For details visit [email or web address].   Also download the MyChart app! Go to the app store, search "MyChart", open the app, select [place], and log in with your MyChart username and password.

## 2023-10-06 NOTE — Progress Notes (Signed)
Patient presents today for treatment. Creatinine 1.61 patient states she was sick last week and felt like she was dehydrated when she was sick, Dr. Ellin Saba made aware, okay to give with treatment. Patient tolerated therapy with no complaints voiced. Side effects with management reviewed with understanding verbalized. Port site clean and dry with no bruising or swelling noted at site. Good blood return noted before and after administration of therapy. Band aid applied. Patient left in satisfactory condition with VSS and no s/s of distress noted.

## 2023-10-09 ENCOUNTER — Other Ambulatory Visit: Payer: Self-pay

## 2023-10-20 ENCOUNTER — Other Ambulatory Visit: Payer: Self-pay | Admitting: Hematology

## 2023-10-27 ENCOUNTER — Inpatient Hospital Stay: Payer: Managed Care, Other (non HMO)

## 2023-10-27 ENCOUNTER — Inpatient Hospital Stay: Payer: Managed Care, Other (non HMO) | Attending: Oncology

## 2023-10-27 VITALS — BP 139/76 | HR 58 | Temp 96.7°F | Resp 16

## 2023-10-27 DIAGNOSIS — C50212 Malignant neoplasm of upper-inner quadrant of left female breast: Secondary | ICD-10-CM | POA: Diagnosis present

## 2023-10-27 DIAGNOSIS — Z95828 Presence of other vascular implants and grafts: Secondary | ICD-10-CM

## 2023-10-27 DIAGNOSIS — Z170 Estrogen receptor positive status [ER+]: Secondary | ICD-10-CM | POA: Diagnosis not present

## 2023-10-27 DIAGNOSIS — Z79899 Other long term (current) drug therapy: Secondary | ICD-10-CM | POA: Insufficient documentation

## 2023-10-27 DIAGNOSIS — Z79811 Long term (current) use of aromatase inhibitors: Secondary | ICD-10-CM | POA: Diagnosis not present

## 2023-10-27 DIAGNOSIS — D508 Other iron deficiency anemias: Secondary | ICD-10-CM | POA: Insufficient documentation

## 2023-10-27 DIAGNOSIS — Z5112 Encounter for antineoplastic immunotherapy: Secondary | ICD-10-CM | POA: Insufficient documentation

## 2023-10-27 DIAGNOSIS — C50412 Malignant neoplasm of upper-outer quadrant of left female breast: Secondary | ICD-10-CM | POA: Diagnosis present

## 2023-10-27 LAB — CBC WITH DIFFERENTIAL/PLATELET
Abs Immature Granulocytes: 0.07 10*3/uL (ref 0.00–0.07)
Basophils Absolute: 0.1 10*3/uL (ref 0.0–0.1)
Basophils Relative: 1 %
Eosinophils Absolute: 0.2 10*3/uL (ref 0.0–0.5)
Eosinophils Relative: 3 %
HCT: 35.5 % — ABNORMAL LOW (ref 36.0–46.0)
Hemoglobin: 11.4 g/dL — ABNORMAL LOW (ref 12.0–15.0)
Immature Granulocytes: 1 %
Lymphocytes Relative: 14 %
Lymphs Abs: 0.9 10*3/uL (ref 0.7–4.0)
MCH: 30.9 pg (ref 26.0–34.0)
MCHC: 32.1 g/dL (ref 30.0–36.0)
MCV: 96.2 fL (ref 80.0–100.0)
Monocytes Absolute: 0.5 10*3/uL (ref 0.1–1.0)
Monocytes Relative: 7 %
Neutro Abs: 4.8 10*3/uL (ref 1.7–7.7)
Neutrophils Relative %: 74 %
Platelets: 218 10*3/uL (ref 150–400)
RBC: 3.69 MIL/uL — ABNORMAL LOW (ref 3.87–5.11)
RDW: 16.3 % — ABNORMAL HIGH (ref 11.5–15.5)
WBC: 6.5 10*3/uL (ref 4.0–10.5)
nRBC: 0 % (ref 0.0–0.2)

## 2023-10-27 LAB — COMPREHENSIVE METABOLIC PANEL
ALT: 26 U/L (ref 0–44)
AST: 20 U/L (ref 15–41)
Albumin: 3.7 g/dL (ref 3.5–5.0)
Alkaline Phosphatase: 150 U/L — ABNORMAL HIGH (ref 38–126)
Anion gap: 10 (ref 5–15)
BUN: 23 mg/dL (ref 8–23)
CO2: 23 mmol/L (ref 22–32)
Calcium: 10 mg/dL (ref 8.9–10.3)
Chloride: 105 mmol/L (ref 98–111)
Creatinine, Ser: 1.28 mg/dL — ABNORMAL HIGH (ref 0.44–1.00)
GFR, Estimated: 46 mL/min — ABNORMAL LOW (ref >60)
Glucose, Bld: 121 mg/dL — ABNORMAL HIGH (ref 70–99)
Potassium: 4.1 mmol/L (ref 3.5–5.1)
Sodium: 138 mmol/L (ref 135–145)
Total Bilirubin: 0.6 mg/dL (ref 0.0–1.2)
Total Protein: 7.3 g/dL (ref 6.5–8.1)

## 2023-10-27 LAB — MAGNESIUM: Magnesium: 1.6 mg/dL — ABNORMAL LOW (ref 1.7–2.4)

## 2023-10-27 MED ORDER — SODIUM CHLORIDE 0.9 % IV SOLN: Freq: Once | INTRAVENOUS | Status: AC

## 2023-10-27 MED ORDER — SODIUM CHLORIDE FLUSH 0.9 % IV SOLN
10.0000 mL | Freq: Once | INTRAVENOUS | Status: AC
Start: 2023-10-27 — End: 2023-10-27
  Administered 2023-10-27: 10 mL via INTRAVENOUS
  Filled 2023-10-27: qty 10

## 2023-10-27 MED ORDER — MAGNESIUM SULFATE 2 GM/50ML IV SOLN
2.0000 g | Freq: Once | INTRAVENOUS | Status: AC
  Administered 2023-10-27: 2 g via INTRAVENOUS
  Filled 2023-10-27: qty 50

## 2023-10-27 MED ORDER — SODIUM CHLORIDE 0.9% FLUSH
10.0000 mL | INTRAVENOUS | Status: DC | PRN
Start: 2023-10-27 — End: 2023-10-27

## 2023-10-27 MED ORDER — TRASTUZUMAB-ANNS CHEMO 150 MG IV SOLR
6.0000 mg/kg | Freq: Once | INTRAVENOUS | Status: AC
  Administered 2023-10-27: 420 mg via INTRAVENOUS
  Filled 2023-10-27: qty 20

## 2023-10-27 MED ORDER — SODIUM CHLORIDE 0.9 % IV SOLN
420.0000 mg | Freq: Once | INTRAVENOUS | Status: AC
  Administered 2023-10-27: 420 mg via INTRAVENOUS
  Filled 2023-10-27: qty 14

## 2023-10-27 MED ORDER — ACETAMINOPHEN 325 MG PO TABS
650.0000 mg | ORAL_TABLET | Freq: Once | ORAL | Status: AC
  Administered 2023-10-27: 650 mg via ORAL
  Filled 2023-10-27: qty 2

## 2023-10-27 MED ORDER — HEPARIN SOD (PORK) LOCK FLUSH 100 UNIT/ML IV SOLN
500.0000 [IU] | Freq: Once | INTRAVENOUS | Status: AC | PRN
  Administered 2023-10-27: 500 [IU]

## 2023-10-27 NOTE — Progress Notes (Signed)
 Patient presents today for Kanjinti/Perjeta infusion. Patient is in satisfactory condition with no new complaints voiced.  Vital signs are stable.  Labs reviewed by Dr. Ellin Saba during the office visit and all labs are within treatment parameters. Patient will receive 2g IV magnesium sulfate per Dr.Katragadda's standing orders. We will proceed with treatment per MD orders.   Patient took Zyrtec at home prior to arrival. Tylenol was given while in the clinic today.  Treatment given today per MD orders. Tolerated infusion without adverse affects. Vital signs stable. No complaints at this time. Discharged from clinic ambulatory in stable condition. Alert and oriented x 3. F/U with [facility] as scheduled.

## 2023-10-27 NOTE — Patient Instructions (Signed)
 [facility] [place] - A DEPT OF [facility]  Discharge Instructions: Thank you for choosing [facility] to provide your oncology and hematology care.  If you have a lab appointment with the Cancer Center - please note that after April 8th, 2024, all labs will be drawn in the cancer center.  You do not have to check in or register with the main entrance as you have in the past but will complete your check-in in the cancer center.  Wear comfortable clothing and clothing appropriate for easy access to any Portacath or PICC line.   We strive to give you quality time with your provider. You may need to reschedule your appointment if you arrive late (15 or more minutes).  Arriving late affects you and other patients whose appointments are after yours.  Also, if you miss three or more appointments without notifying the office, you may be dismissed from the clinic at the provider's discretion.      For prescription refill requests, have your pharmacy contact our office and allow 72 hours for refills to be completed.    Today you received the following chemotherapy and/or immunotherapy agents Kanjinti/Perjeta   To help prevent nausea and vomiting after your treatment, we encourage you to take your nausea medication as directed.  Trastuzumab Injection What is this medication? TRASTUZUMAB (tras TOO zoo mab) treats breast cancer and stomach cancer. It works by blocking a protein that causes cancer cells to grow and multiply. This helps to slow or stop the spread of cancer cells. This medicine may be used for other purposes; ask your health care provider or pharmacist if you have questions. COMMON BRAND NAME(S): Herceptin, HERCESSI, Herzuma, KANJINTI, Ogivri, Ontruzant, Trazimera What should I tell my care team before I take this medication? They need to know if you have any of these conditions: Heart failure Lung disease An unusual or allergic reaction to  trastuzumab, other medications, foods, dyes, or preservatives Pregnant or trying to get pregnant Breast-feeding How should I use this medication? This medication is injected into a vein. It is given by your care team in a hospital or clinic setting. Talk to your care team about the use of this medication in children. It is not approved for use in children. Overdosage: If you think you have taken too much of this medicine contact a poison control center or emergency room at once. NOTE: This medicine is only for you. Do not share this medicine with others. What if I miss a dose? Keep appointments for follow-up doses. It is important not to miss your dose. Call your care team if you are unable to keep an appointment. What may interact with this medication? Certain types of chemotherapy, such as daunorubicin, doxorubicin, epirubicin, idarubicin This list may not describe all possible interactions. Give your health care provider a list of all the medicines, herbs, non-prescription drugs, or dietary supplements you use. Also tell them if you smoke, drink alcohol, or use illegal drugs. Some items may interact with your medicine. What should I watch for while using this medication? Your condition will be monitored carefully while you are receiving this medication. This medication may make you feel generally unwell. This is not uncommon, as chemotherapy affects healthy cells as well as cancer cells. Report any side effects. Continue your course of treatment even though you feel ill unless your care team tells you to stop. This medication may increase your risk of getting an infection. Call your  care team for advice if you get a fever, chills, sore throat, or other symptoms of a cold or flu. Do not treat yourself. Try to avoid being around people who are sick. Avoid taking medications that contain aspirin, acetaminophen, ibuprofen, naproxen, or ketoprofen unless instructed by your care team. These medications  can hide a fever. Talk to your care team if you may be pregnant. Serious birth defects can occur if you take this medication during pregnancy and for 7 months after the last dose. You will need a negative pregnancy test before starting this medication. Contraception is recommended while taking this medication and for 7 months after the last dose. Your care team can help you find the option that works for you. Do not breastfeed while taking this medication and for 7 months after stopping treatment. What side effects may I notice from receiving this medication? Side effects that you should report to your care team as soon as possible: Allergic reactions or angioedema--skin rash, itching or hives, swelling of the face, eyes, lips, tongue, arms, or legs, trouble swallowing or breathing Dry cough, shortness of breath or trouble breathing Heart failure--shortness of breath, swelling of the ankles, feet, or hands, sudden weight gain, unusual weakness or fatigue Infection--fever, chills, cough, or sore throat Infusion reactions--chest pain, shortness of breath or trouble breathing, feeling faint or lightheaded Side effects that usually do not require medical attention (report to your care team if they continue or are bothersome): Diarrhea Dizziness Headache Nausea Trouble sleeping Vomiting This list may not describe all possible side effects. Call your doctor for medical advice about side effects. You may report side effects to FDA at 1-800-FDA-1088. Where should I keep my medication? This medication is given in a hospital or clinic. It will not be stored at home. NOTE: This sheet is a summary. It may not cover all possible information. If you have questions about this medicine, talk to your doctor, pharmacist, or health care provider.  2024 Elsevier/Gold Standard (2021-12-22 00:00:00)  BELOW ARE SYMPTOMS THAT SHOULD BE REPORTED IMMEDIATELY: *FEVER GREATER THAN 100.4 F (38 C) OR HIGHER *CHILLS OR  SWEATING *NAUSEA AND VOMITING THAT IS NOT CONTROLLED WITH YOUR NAUSEA MEDICATION *UNUSUAL SHORTNESS OF BREATH *UNUSUAL BRUISING OR BLEEDING *URINARY PROBLEMS (pain or burning when urinating, or frequent urination) *BOWEL PROBLEMS (unusual diarrhea, constipation, pain near the anus) TENDERNESS IN MOUTH AND THROAT WITH OR WITHOUT PRESENCE OF ULCERS (sore throat, sores in mouth, or a toothache) UNUSUAL RASH, SWELLING OR PAIN  UNUSUAL VAGINAL DISCHARGE OR ITCHING   Items with * indicate a potential emergency and should be followed up as soon as possible or go to the Emergency Department if any problems should occur.  Please show the CHEMOTHERAPY ALERT CARD or IMMUNOTHERAPY ALERT CARD at check-in to the Emergency Department and triage nurse.  Pertuzumab Injection What is this medication? PERTUZUMAB (per TOOZ ue mab) treats breast cancer. It works by blocking a protein that causes cancer cells to grow and multiply. This helps to slow or stop the spread of cancer cells. It is a monoclonal antibody. This medicine may be used for other purposes; ask your health care provider or pharmacist if you have questions. COMMON BRAND NAME(S): PERJETA What should I tell my care team before I take this medication? They need to know if you have any of these conditions: Heart failure An unusual or allergic reaction to pertuzumab, other medications, foods, dyes, or preservatives Pregnant or trying to get pregnant Breast-feeding How should I use this medication?  This medication is injected into a vein. It is given by your care team in a hospital or clinic setting. Talk to your care team about the use of this medication in children. Special care may be needed. Overdosage: If you think you have taken too much of this medicine contact a poison control center or emergency room at once. NOTE: This medicine is only for you. Do not share this medicine with others. What if I miss a dose? Keep appointments for  follow-up doses. It is important not to miss your dose. Call your care team if you are unable to keep an appointment. What may interact with this medication? Interactions are not expected. This list may not describe all possible interactions. Give your health care provider a list of all the medicines, herbs, non-prescription drugs, or dietary supplements you use. Also tell them if you smoke, drink alcohol, or use illegal drugs. Some items may interact with your medicine. What should I watch for while using this medication? Your condition will be monitored carefully while you are receiving this medication. This medication may make you feel generally unwell. This is not uncommon as chemotherapy can affect healthy cells as well as cancer cells. Report any side effects. Continue your course of treatment even though you feel ill unless your care team tells you to stop. Talk to your care team if you may be pregnant. Serious birth defects can occur if you take this medication during pregnancy and for 7 months after the last dose. You will need a negative pregnancy test before starting this medication. Contraception is recommended while taking this medication and for 7 months after the last dose. Your care team can help you find the option that works for you. Do not breastfeed while taking this medication and for 7 months after the last dose. What side effects may I notice from receiving this medication? Side effects that you should report to your care team as soon as possible: Allergic reactions or angioedema--skin rash, itching or hives, swelling of the face, eyes, lips, tongue, arms, or legs, trouble swallowing or breathing Heart failure--shortness of breath, swelling of the ankles, feet, or hands, sudden weight gain, unusual weakness or fatigue Infusion reactions--chest pain, shortness of breath or trouble breathing, feeling faint or lightheaded Side effects that usually do not require medical attention  (report to your care team if they continue or are bothersome): Diarrhea Dry skin Fatigue Hair loss Nausea Vomiting This list may not describe all possible side effects. Call your doctor for medical advice about side effects. You may report side effects to FDA at 1-800-FDA-1088. Where should I keep my medication? This medication is given in a hospital or clinic. It will not be stored at home. NOTE: This sheet is a summary. It may not cover all possible information. If you have questions about this medicine, talk to your doctor, pharmacist, or health care provider.  2024 Elsevier/Gold Standard (2021-12-22 00:00:00) Should you have questions after your visit or need to cancel or reschedule your appointment, please contact [facility]CER [facility] [employer] - A DEPT OF Eligha Bridegroom [facility] [phone]  and follow the prompts.  Office hours are 8:00 a.m. to 4:30 p.m. Monday - Friday. Please note that voicemails left after 4:00 p.m. may not be returned until the following business day.  We are closed weekends and major holidays. You have access to a nurse at all times for urgent questions. Please call the main number to the clinic [phone] and follow the  prompts.  For any non-urgent questions, you may also contact your provider using MyChart. We now offer e-Visits for anyone 1 and older to request care online for non-urgent symptoms. For details visit [email or web address].   Also download the MyChart app! Go to the app store, search "MyChart", open the app, select [place], and log in with your MyChart username and password.

## 2023-11-15 ENCOUNTER — Other Ambulatory Visit: Payer: Self-pay

## 2023-11-17 ENCOUNTER — Inpatient Hospital Stay: Payer: Managed Care, Other (non HMO)

## 2023-11-17 ENCOUNTER — Encounter: Payer: Self-pay | Admitting: Hematology

## 2023-11-17 ENCOUNTER — Inpatient Hospital Stay: Payer: Managed Care, Other (non HMO) | Admitting: Hematology

## 2023-11-17 VITALS — BP 139/76 | HR 58 | Temp 97.8°F

## 2023-11-17 DIAGNOSIS — Z170 Estrogen receptor positive status [ER+]: Secondary | ICD-10-CM

## 2023-11-17 DIAGNOSIS — Z95828 Presence of other vascular implants and grafts: Secondary | ICD-10-CM

## 2023-11-17 DIAGNOSIS — Z5112 Encounter for antineoplastic immunotherapy: Secondary | ICD-10-CM | POA: Diagnosis not present

## 2023-11-17 LAB — CBC WITH DIFFERENTIAL/PLATELET
Abs Immature Granulocytes: 0.05 10*3/uL (ref 0.00–0.07)
Basophils Absolute: 0 10*3/uL (ref 0.0–0.1)
Basophils Relative: 1 %
Eosinophils Absolute: 0.3 10*3/uL (ref 0.0–0.5)
Eosinophils Relative: 5 %
HCT: 38.2 % (ref 36.0–46.0)
Hemoglobin: 12.1 g/dL (ref 12.0–15.0)
Immature Granulocytes: 1 %
Lymphocytes Relative: 13 %
Lymphs Abs: 0.8 10*3/uL (ref 0.7–4.0)
MCH: 30.6 pg (ref 26.0–34.0)
MCHC: 31.7 g/dL (ref 30.0–36.0)
MCV: 96.5 fL (ref 80.0–100.0)
Monocytes Absolute: 0.4 10*3/uL (ref 0.1–1.0)
Monocytes Relative: 6 %
Neutro Abs: 4.7 10*3/uL (ref 1.7–7.7)
Neutrophils Relative %: 74 %
Platelets: 238 10*3/uL (ref 150–400)
RBC: 3.96 MIL/uL (ref 3.87–5.11)
RDW: 15.9 % — ABNORMAL HIGH (ref 11.5–15.5)
WBC: 6.3 10*3/uL (ref 4.0–10.5)
nRBC: 0 % (ref 0.0–0.2)

## 2023-11-17 LAB — COMPREHENSIVE METABOLIC PANEL WITH GFR
ALT: 16 U/L (ref 0–44)
AST: 14 U/L — ABNORMAL LOW (ref 15–41)
Albumin: 3.6 g/dL (ref 3.5–5.0)
Alkaline Phosphatase: 118 U/L (ref 38–126)
Anion gap: 8 (ref 5–15)
BUN: 27 mg/dL — ABNORMAL HIGH (ref 8–23)
CO2: 24 mmol/L (ref 22–32)
Calcium: 10.2 mg/dL (ref 8.9–10.3)
Chloride: 103 mmol/L (ref 98–111)
Creatinine, Ser: 1.46 mg/dL — ABNORMAL HIGH (ref 0.44–1.00)
GFR, Estimated: 39 mL/min — ABNORMAL LOW (ref >60)
Glucose, Bld: 144 mg/dL — ABNORMAL HIGH (ref 70–99)
Potassium: 4.2 mmol/L (ref 3.5–5.1)
Sodium: 135 mmol/L (ref 135–145)
Total Bilirubin: 0.6 mg/dL (ref 0.0–1.2)
Total Protein: 7.1 g/dL (ref 6.5–8.1)

## 2023-11-17 LAB — MAGNESIUM: Magnesium: 1.8 mg/dL (ref 1.7–2.4)

## 2023-11-17 MED ORDER — SODIUM CHLORIDE 0.9 % IV SOLN
420.0000 mg | Freq: Once | INTRAVENOUS | Status: AC
  Administered 2023-11-17: 420 mg via INTRAVENOUS
  Filled 2023-11-17: qty 14

## 2023-11-17 MED ORDER — SODIUM CHLORIDE 0.9% FLUSH: 10.0000 mL | INTRAVENOUS | Status: DC | PRN

## 2023-11-17 MED ORDER — CETIRIZINE HCL 10 MG PO TABS
10.0000 mg | ORAL_TABLET | Freq: Once | ORAL | Status: DC
Start: 2023-11-17 — End: 2023-11-17
  Filled 2023-11-17: qty 1

## 2023-11-17 MED ORDER — SODIUM CHLORIDE 0.9 % IV SOLN
6.0000 mg/kg | Freq: Once | INTRAVENOUS | Status: AC
  Administered 2023-11-17: 420 mg via INTRAVENOUS
  Filled 2023-11-17: qty 20

## 2023-11-17 MED ORDER — ACETAMINOPHEN 325 MG PO TABS
650.0000 mg | ORAL_TABLET | Freq: Once | ORAL | Status: DC
  Filled 2023-11-17: qty 2

## 2023-11-17 MED ORDER — SODIUM CHLORIDE 0.9% FLUSH
10.0000 mL | Freq: Once | INTRAVENOUS | Status: AC
Start: 2023-11-17 — End: 2023-11-17
  Administered 2023-11-17: 10 mL via INTRAVENOUS

## 2023-11-17 MED ORDER — HEPARIN SOD (PORK) LOCK FLUSH 100 UNIT/ML IV SOLN
500.0000 [IU] | Freq: Once | INTRAVENOUS | Status: AC | PRN
  Administered 2023-11-17: 500 [IU]

## 2023-11-17 MED ORDER — SODIUM CHLORIDE 0.9 % IV SOLN: Freq: Once | INTRAVENOUS | Status: AC

## 2023-11-17 NOTE — Progress Notes (Signed)
 Patient took own tylenol and allegra from home.  Pharmacy aware.   Patient tolerated therapy with no complaints voiced.  Side effects with management reviewed with understanding verbalized.  Port site clean and dry with no bruising or swelling noted at site.  Good blood return noted before and after administration of therapy.  Band aid applied.  Patient left in satisfactory condition with VSS and no s/s of distress noted.

## 2023-11-17 NOTE — Patient Instructions (Signed)
 [facility] [place] [facility]  Discharge Instructions: Thank you for choosing [facility] to provide your oncology and hematology care.  If you have a lab appointment with the Cancer Center - please note that after April 8th, 2024, all labs will be drawn in the cancer center.  You do not have to check in or register with the main entrance as you have in the past but will complete your check-in in the cancer center.  Wear comfortable clothing and clothing appropriate for easy access to any Portacath or PICC line.   We strive to give you quality time with your provider. You may need to reschedule your appointment if you arrive late (15 or more minutes).  Arriving late affects you and other patients whose appointments are after yours.  Also, if you miss three or more appointments without notifying the office, you may be dismissed from the clinic at the provider's discretion.      For prescription refill requests, have your pharmacy contact our office and allow 72 hours for refills to be completed.    Today you received the following chemotherapy and/or immunotherapy agents Perjeta and Kanjinti      To help prevent nausea and vomiting after your treatment, we encourage you to take your nausea medication as directed.  BELOW ARE SYMPTOMS THAT SHOULD BE REPORTED IMMEDIATELY: *FEVER GREATER THAN 100.4 F (38 C) OR HIGHER *CHILLS OR SWEATING *NAUSEA AND VOMITING THAT IS NOT CONTROLLED WITH YOUR NAUSEA MEDICATION *UNUSUAL SHORTNESS OF BREATH *UNUSUAL BRUISING OR BLEEDING *URINARY PROBLEMS (pain or burning when urinating, or frequent urination) *BOWEL PROBLEMS (unusual diarrhea, constipation, pain near the anus) TENDERNESS IN MOUTH AND THROAT WITH OR WITHOUT PRESENCE OF ULCERS (sore throat, sores in mouth, or a toothache) UNUSUAL RASH, SWELLING OR PAIN  UNUSUAL VAGINAL DISCHARGE OR ITCHING   Items with * indicate a potential emergency and should be  followed up as soon as possible or go to the Emergency Department if any problems should occur.  Please show the CHEMOTHERAPY ALERT CARD or IMMUNOTHERAPY ALERT CARD at check-in to the Emergency Department and triage nurse.  Should you have questions after your visit or need to cancel or reschedule your appointment, please contact [facility] [place] - A DEPT OF [facility] Bridegroom [facility] [phone]  and follow the prompts.  Office hours are 8:00 a.m. to 4:30 p.m. Monday - Friday. Please note that voicemails left after 4:00 p.m. may not be returned until the following business day.  We are closed weekends and major holidays. You have access to a nurse at all times for urgent questions. Please call the main number to the clinic [phone] and follow the prompts.  For any non-urgent questions, you may also contact your provider using MyChart. We now offer e-Visits for anyone 90 and older to request care online for non-urgent symptoms. For details visit [email or web address].   Also download the MyChart app! Go to the app store, search "MyChart", open the app, select [place], and log in with your MyChart username and password.

## 2023-12-08 ENCOUNTER — Inpatient Hospital Stay: Payer: Managed Care, Other (non HMO)

## 2023-12-08 ENCOUNTER — Encounter: Payer: Self-pay | Admitting: *Deleted

## 2023-12-08 ENCOUNTER — Inpatient Hospital Stay: Payer: Managed Care, Other (non HMO) | Attending: Oncology | Admitting: Hematology

## 2023-12-08 VITALS — BP 148/80 | HR 55 | Temp 97.2°F | Resp 18

## 2023-12-08 VITALS — BP 125/72 | HR 64 | Temp 97.7°F | Resp 16 | Wt 157.6 lb

## 2023-12-08 DIAGNOSIS — Z5112 Encounter for antineoplastic immunotherapy: Secondary | ICD-10-CM | POA: Insufficient documentation

## 2023-12-08 DIAGNOSIS — D649 Anemia, unspecified: Secondary | ICD-10-CM | POA: Insufficient documentation

## 2023-12-08 DIAGNOSIS — F17210 Nicotine dependence, cigarettes, uncomplicated: Secondary | ICD-10-CM | POA: Diagnosis not present

## 2023-12-08 DIAGNOSIS — Z170 Estrogen receptor positive status [ER+]: Secondary | ICD-10-CM

## 2023-12-08 DIAGNOSIS — Z1721 Progesterone receptor positive status: Secondary | ICD-10-CM | POA: Insufficient documentation

## 2023-12-08 DIAGNOSIS — Z95828 Presence of other vascular implants and grafts: Secondary | ICD-10-CM

## 2023-12-08 DIAGNOSIS — C50812 Malignant neoplasm of overlapping sites of left female breast: Secondary | ICD-10-CM | POA: Diagnosis not present

## 2023-12-08 DIAGNOSIS — Z79899 Other long term (current) drug therapy: Secondary | ICD-10-CM | POA: Insufficient documentation

## 2023-12-08 DIAGNOSIS — C50412 Malignant neoplasm of upper-outer quadrant of left female breast: Secondary | ICD-10-CM | POA: Insufficient documentation

## 2023-12-08 DIAGNOSIS — C50212 Malignant neoplasm of upper-inner quadrant of left female breast: Secondary | ICD-10-CM | POA: Insufficient documentation

## 2023-12-08 LAB — CBC WITH DIFFERENTIAL/PLATELET
Abs Immature Granulocytes: 0.02 10*3/uL (ref 0.00–0.07)
Basophils Absolute: 0 10*3/uL (ref 0.0–0.1)
Basophils Relative: 1 %
Eosinophils Absolute: 0.2 10*3/uL (ref 0.0–0.5)
Eosinophils Relative: 4 %
HCT: 36.4 % (ref 36.0–46.0)
Hemoglobin: 11.8 g/dL — ABNORMAL LOW (ref 12.0–15.0)
Immature Granulocytes: 0 %
Lymphocytes Relative: 15 %
Lymphs Abs: 0.9 10*3/uL (ref 0.7–4.0)
MCH: 31.1 pg (ref 26.0–34.0)
MCHC: 32.4 g/dL (ref 30.0–36.0)
MCV: 96 fL (ref 80.0–100.0)
Monocytes Absolute: 0.5 10*3/uL (ref 0.1–1.0)
Monocytes Relative: 8 %
Neutro Abs: 4.5 10*3/uL (ref 1.7–7.7)
Neutrophils Relative %: 72 %
Platelets: 210 10*3/uL (ref 150–400)
RBC: 3.79 MIL/uL — ABNORMAL LOW (ref 3.87–5.11)
RDW: 15.3 % (ref 11.5–15.5)
WBC: 6.2 10*3/uL (ref 4.0–10.5)
nRBC: 0 % (ref 0.0–0.2)

## 2023-12-08 LAB — COMPREHENSIVE METABOLIC PANEL WITH GFR
ALT: 16 U/L (ref 0–44)
AST: 17 U/L (ref 15–41)
Albumin: 3.6 g/dL (ref 3.5–5.0)
Alkaline Phosphatase: 112 U/L (ref 38–126)
Anion gap: 9 (ref 5–15)
BUN: 31 mg/dL — ABNORMAL HIGH (ref 8–23)
CO2: 24 mmol/L (ref 22–32)
Calcium: 10.2 mg/dL (ref 8.9–10.3)
Chloride: 103 mmol/L (ref 98–111)
Creatinine, Ser: 1.65 mg/dL — ABNORMAL HIGH (ref 0.44–1.00)
GFR, Estimated: 34 mL/min — ABNORMAL LOW (ref >60)
Glucose, Bld: 119 mg/dL — ABNORMAL HIGH (ref 70–99)
Potassium: 4.2 mmol/L (ref 3.5–5.1)
Sodium: 136 mmol/L (ref 135–145)
Total Bilirubin: 0.4 mg/dL (ref 0.0–1.2)
Total Protein: 6.9 g/dL (ref 6.5–8.1)

## 2023-12-08 LAB — MAGNESIUM: Magnesium: 1.6 mg/dL — ABNORMAL LOW (ref 1.7–2.4)

## 2023-12-08 MED ORDER — TRASTUZUMAB-ANNS CHEMO 150 MG IV SOLR
6.0000 mg/kg | Freq: Once | INTRAVENOUS | Status: AC
  Administered 2023-12-08: 420 mg via INTRAVENOUS
  Filled 2023-12-08: qty 20

## 2023-12-08 MED ORDER — SODIUM CHLORIDE 0.9% FLUSH
10.0000 mL | Freq: Once | INTRAVENOUS | Status: AC
  Administered 2023-12-08: 10 mL via INTRAVENOUS

## 2023-12-08 MED ORDER — HEPARIN SOD (PORK) LOCK FLUSH 100 UNIT/ML IV SOLN
500.0000 [IU] | Freq: Once | INTRAVENOUS | Status: AC | PRN
  Administered 2023-12-08: 500 [IU]

## 2023-12-08 MED ORDER — SODIUM CHLORIDE 0.9 % IV SOLN
420.0000 mg | Freq: Once | INTRAVENOUS | Status: AC
  Administered 2023-12-08: 420 mg via INTRAVENOUS
  Filled 2023-12-08: qty 14

## 2023-12-08 MED ORDER — SODIUM CHLORIDE 0.9 % IV SOLN: Freq: Once | INTRAVENOUS | Status: AC

## 2023-12-08 MED ORDER — CETIRIZINE HCL 10 MG PO TABS: 10.0000 mg | ORAL_TABLET | Freq: Once | ORAL | Status: DC

## 2023-12-08 MED ORDER — MAGNESIUM SULFATE 2 GM/50ML IV SOLN
2.0000 g | Freq: Once | INTRAVENOUS | Status: AC
  Administered 2023-12-08: 2 g via INTRAVENOUS
  Filled 2023-12-08: qty 50

## 2023-12-08 MED ORDER — ACETAMINOPHEN 325 MG PO TABS: 650.0000 mg | ORAL_TABLET | Freq: Once | ORAL | Status: DC

## 2023-12-08 NOTE — Patient Instructions (Signed)
 [facility] [place] [facility]  [facility]charge Instructions: Thank you for choosing [facility] to provide your oncology and hematology care.  If you have a lab appointment with the Cancer Center - please note that after April 8th, 2024, all labs will be drawn in the cancer center.  You do not have to check in or register with the main entrance as you have in the past but will complete your check-in in the cancer center.  Wear comfortable clothing and clothing appropriate for easy access to any Portacath or PICC line.   We strive to give you quality time with your provider. You may need to reschedule your appointment if you arrive late (15 or more minutes).  Arriving late affects you and other patients whose appointments are after yours.  Also, if you miss three or more appointments without notifying the office, you may be dismissed from the clinic at the provider's discretion.      For prescription refill requests, have your pharmacy contact our office and allow 72 hours for refills to be completed.    Today you received the following chemotherapy and/or immunotherapy agents kanjinti and perjeta      To help prevent nausea and vomiting after your treatment, we encourage you to take your nausea medication as directed.  BELOW ARE SYMPTOMS THAT SHOULD BE REPORTED IMMEDIATELY: *FEVER GREATER THAN 100.4 F (38 C) OR HIGHER *CHILLS OR SWEATING *NAUSEA AND VOMITING THAT IS NOT CONTROLLED WITH YOUR NAUSEA MEDICATION *UNUSUAL SHORTNESS OF BREATH *UNUSUAL BRUISING OR BLEEDING *URINARY PROBLEMS (pain or burning when urinating, or frequent urination) *BOWEL PROBLEMS (unusual diarrhea, constipation, pain near the anus) TENDERNESS IN MOUTH AND THROAT WITH OR WITHOUT PRESENCE OF ULCERS (sore throat, sores in mouth, or a toothache) UNUSUAL RASH, SWELLING OR PAIN  UNUSUAL VAGINAL DISCHARGE OR ITCHING   Items with * indicate a potential emergency and should be  followed up as soon as possible or go to the Emergency Department if any problems should occur.  Please show the CHEMOTHERAPY ALERT CARD or IMMUNOTHERAPY ALERT CARD at check-in to the Emergency Department and triage nurse.  Should you have questions after your visit or need to cancel or reschedule your appointment, please contact [facility] [place] - A DEPT OF [facility] Bridegroom [facility] [phone]  and follow the prompts.  Office hours are 8:00 a.m. to 4:30 p.m. Monday - Friday. Please note that voicemails left after 4:00 p.m. may not be returned until the following business day.  We are closed weekends and major holidays. You have access to a nurse at all times for urgent questions. Please call the main number to the clinic [phone] and follow the prompts.  For any non-urgent questions, you may also contact your provider using MyChart. We now offer e-Visits for anyone 31 and older to request care online for non-urgent symptoms. For details visit [email or web address].   Also download the MyChart app! Go to the app store, search "MyChart", open the app, select [place], and log in with your MyChart username and password.

## 2023-12-08 NOTE — Patient Instructions (Signed)

## 2023-12-08 NOTE — Progress Notes (Signed)

## 2023-12-08 NOTE — Progress Notes (Signed)
 Patient has been examined by Dr. Ellin Saba. Vital signs and labs have been reviewed by MD - ANC, Creatinine, LFTs, hemoglobin, and platelets are within treatment parameters per M.D. - pt may proceed with treatment.  Primary RN and pharmacy notified.

## 2023-12-08 NOTE — Progress Notes (Signed)
 [facility] [address] [place] [address]    Clinic Day:  12/08/23   Referring physician: [name], Traci Fuller  Patient Care Team: [name], Traci Fuller as PCP - General (Internal Medicine) [name], Traci Fuller as PCP - Cardiology (Cardiology) [name], Traci Fuller as Medical Oncologist (Medical Oncology) [name], RN as Oncology Nurse Navigator (Medical Oncology)   ASSESSMENT & PLAN:   Assessment: 1. 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 - She noticed lump has gotten bigger and deformed since September 2022.  She has itching over the skin of the breast. - Left breast 4:00 biopsy: Invasive mammary carcinoma, grade 2, ER 100%, PR 70%, Ki-67 25%, HER2 FISH positive - Left breast 2:00 biopsy: Invasive ductal carcinoma, grade 2, ER 100%, PR 70%, Ki-67 25%, HER2 negative - Left axillary lymph node biopsy: IDC, ER 100%, PR 50%, HER2 negative, E-cadherin positive - Bilateral breast MRI on 10/09/2021: Multiple enhancing masses with intervening non-mass enhancement involving most of the LOQ and part of the UOQ.  Abnormal enhancement spans approximately 12.4 x 6.4 x 7 cm.  Diffuse skin thickening/edema.  Multiple left axillary lymph nodes, largest node measuring approximately 2.4 cm in the lower axilla.  Indeterminate non-mass enhancement inferior to the biopsy site in the subareolar right breast measuring 1.5 x 0.9 x 0.8 cm. - Right breast biopsy UOQ on 10/01/2021: Fibroadenomatoid nodule with calcifications, fibrocystic changes including apocrine metaplasia with no malignancy. -PET scan from 10/22/2021: Large left breast mass and numerous hypermetabolic left axillary and subpectoral lymph nodes.  2 hypermetabolic left upper lobe lung nodules measuring 7 mm with SUV 4.2 and 3.2 respectively.  No other hypermetabolic lung nodules.  No distant metastatic disease.  Nonspecific low-level subareolar activity in the right  breast near the surgical clips and a single mildly hypermetabolic right axillary lymph node. - Echocardiogram shows EF 60 to 65%. - She has oligometastatic disease with 2 small lung nodules.  Biopsy is not feasible.  I have recommended TCHP regimen followed by restaging after 3 cycles. -6 cycles of dose reduced TCHP from 11/04/2021 through 03/11/2022 - Maintenance Herceptin and pertuzumab started on 04/01/2022, anastrozole started on 04/01/2022 -Left mastectomy and lymph node dissection (05/05/2022): [id number], residual IDC, grade 2, discontinuously involving fibrotic area measuring 5.9 cm, margins negative, LVI positive, response to therapy seen including hyalin fibrosis and dystrophic calcifications.  9/18 lymph nodes positive, largest focus of metastatic carcinoma measures 1.2 cm with extranodal extension.  Right breast lumpectomy shows atypical lobular hyperplasia. - We discussed switching to Kadcyla in the adjuvant setting.  Related to efficacy of Kadcyla compared with pertuzumab and trastuzumab in the adjuvant setting is unknown.  Patient is reluctant to consider switching as she had poor tolerance with the prior chemotherapy.  Hence we have continued Herceptin and Perjeta. - XRT to the chest wall from 09/13/2022   2. Social/family history: - She lives at home with her husband and daughter.  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  Current active smoker, half pack per day since age 6. - Mother had cervical cancer and bladder cancer.  Maternal grandmother had ovarian cancer.  Brother died at age 82 from acute leukemia.  Another brother died of liver cancer.  Maternal half aunt had breast cancer.    Plan: 1. Oligometastatic (two 7 mm lung lesions) HER2 positive left breast cancer: - CT CAP (09/09/2023): No evidence of new or  progressive disease.  Scattered stable tiny lung nodules.  Similar presacral soft tissue mass, likely benign. - She is tolerating Herceptin and pertuzumab  reasonably well.  She is also tolerating anastrozole very well. - Reviewed labs today: Normal LFTs.  Creatinine 1.65 and stable.  CBC grossly normal. - She will proceed with Herceptin and pertuzumab every 3 weeks.  RTC 12 weeks for follow-up with repeat CT CAP with contrast.   2.  Right facial shingles/postherpetic right hemifacial pain: - Continue gabapentin 300 mg twice daily.  Symptoms are stable.  3.  Hypomagnesemia: - Continue magnesium 2 tablets twice daily.  Magnesium is slightly low at 1.6 today.  If it continues to be low at next visit, increase to 3 times daily.  4.  High risk drug monitoring: - Echo on 09/09/2023 with LVEF 55 to 60% and stable.  She does not have any signs or symptoms of PND or orthopnea.  5.  Normocytic anemia: - Received Venofer 500 mg on 09/22/2023.  Hemoglobin today is 11.8.  Will check ferritin and iron panel periodically.  6.  Left leg DVT (diagnosed 03/04/2022): - Continue Eliquis twice daily.  No bleeding issues.  7.  Osteopenia (DEXA 04/19/2022 T score -2.1): - Continue calcium and vitamin D supplements.    Orders Placed This Encounter  Procedures   CT CHEST ABDOMEN PELVIS W CONTRAST    Standing Status:   Future    Expected Date:   03/08/2024    Expiration Date:   12/07/2024    If indicated for the ordered procedure, I authorize the administration of contrast media per Radiology protocol:   Yes    Does the patient have a contrast media/X-ray dye allergy?:   No    Preferred imaging location?:   [facility]    If indicated for the ordered procedure, I authorize the administration of oral contrast media per Radiology protocol:   Yes   Magnesium    Standing Status:   Future    Expected Date:   01/19/2024    Expiration Date:   01/18/2025   CBC with Differential    Standing Status:   Future    Expected Date:   01/19/2024    Expiration Date:   01/18/2025   Comprehensive metabolic panel    Standing Status:   Future    Expected Date:   01/19/2024     Expiration Date:   01/18/2025   Magnesium    Standing Status:   Future    Expected Date:   02/09/2024    Expiration Date:   02/08/2025   CBC with Differential    Standing Status:   Future    Expected Date:   02/09/2024    Expiration Date:   02/08/2025   Comprehensive metabolic panel    Standing Status:   Future    Expected Date:   02/09/2024    Expiration Date:   02/08/2025   Magnesium    Standing Status:   Future    Expected Date:   03/01/2024    Expiration Date:   03/01/2025   CBC with Differential    Standing Status:   Future    Expected Date:   03/01/2024    Expiration Date:   03/01/2025   Comprehensive metabolic panel    Standing Status:   Future    Expected Date:   03/01/2024    Expiration Date:   03/01/2025   Magnesium    Standing Status:   Future    Expected Date:  03/22/2024    Expiration Date:   03/22/2025   CBC with Differential    Standing Status:   Future    Expected Date:   03/22/2024    Expiration Date:   03/22/2025   Comprehensive metabolic panel    Standing Status:   Future    Expected Date:   03/22/2024    Expiration Date:   03/22/2025      Traci Fuller,acting as a scribe for Traci Boros, Traci Fuller.,have documented all relevant documentation on the behalf of Traci Boros, Traci Fuller,as directed by  Traci Boros, Traci Fuller while in the presence of Traci Boros, Traci Fuller.  I, Traci Boros Traci Fuller, have reviewed the above documentation for accuracy and completeness, and I agree with the above.      Traci Boros, Traci Fuller   4/17/20252:39 PM  CHIEF COMPLAINT:   Diagnosis: locally advanced left breast cancer    Cancer Staging  Breast cancer, left [facility]) Staging form: Breast, AJCC 8th Edition - Clinical stage from 10/15/2021: Stage IIA (cT3, cN1, cM0, G2, ER+, PR+, HER2+) - Unsigned    Prior Therapy: TCHP x 6 cycles   Current Therapy:  Anastrozole daily, trastuzumab and Herceptin every 21 days    HISTORY OF PRESENT ILLNESS:   Oncology History   Breast cancer, left (HCC)  10/15/2021 Initial Diagnosis   Breast cancer, left (HCC)   11/04/2021 - 04/22/2022 Chemotherapy   Patient is on Treatment Plan : BREAST  Docetaxel + Carboplatin + Trastuzumab + Pertuzumab  (TCHP) q21d       Genetic Testing   Negative genetic testing. No pathogenic variants identified on the [facility] Multi-Cancer+RNA panel. The report date is 11/02/2021.   The Multi-Cancer Panel + RNA offered by [facility] includes sequencing and/or deletion duplication testing of the following 84 genes: AIP, ALK, APC, ATM, AXIN2,BAP1,  BARD1, BLM, BMPR1A, BRCA1, BRCA2, BRIP1, CASR, CDC73, CDH1, CDK4, CDKN1B, CDKN1C, CDKN2A (p14ARF), CDKN2A (p16INK4a), CEBPA, CHEK2, CTNNA1, DICER1, DIS3L2, EGFR (c.2369C>T, p.Thr790Met variant only), EPCAM (Deletion/duplication testing only), FH, FLCN, GATA2, GPC3, GREM1 (Promoter region deletion/duplication testing only), HOXB13 (c.251G>A, p.Gly84Glu), HRAS, KIT, MAX, MEN1, MET, MITF (c.952G>A, p.Glu318Lys variant only), MLH1, MSH2, MSH3, MSH6, MUTYH, NBN, NF1, NF2, NTHL1, PALB2, PDGFRA, PHOX2B, PMS2, POLD1, POLE, POT1, PRKAR1A, PTCH1, PTEN, RAD50, RAD51C, RAD51D, RB1, RECQL4, RET, RUNX1, SDHAF2, SDHA (sequence changes only), SDHB, SDHC, SDHD, SMAD4, SMARCA4, SMARCB1, SMARCE1, STK11, SUFU, TERC, TERT, TMEM127, TP53, TSC1, TSC2, VHL, WRN and WT1.   11/04/2021 -  Chemotherapy   Patient is on Treatment Plan : BREAST  Docetaxel + Carboplatin + Trastuzumab + Pertuzumab  (TCHP) q21d / Trastuzumab + Pertuzumab q21d        INTERVAL HISTORY:   Traci Fuller is a 69 y.o. female presenting to clinic today for follow up of locally advanced left breast cancer. She was last seen by me on 09/15/23.  Today, she states that she is doing well overall. Her appetite level is at 100%. Her energy level is at 75%. Traci Fuller is tolerating herceptin and pertuzumab well. She notes loose stools 2-3 times each morning. She also reports an intermittent itching erythematous macular rash along the left  shin. She denies any difficulty breathing when lying down.   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 She denies any numbness or tingling in the feet. Traci Fuller reports occasional left leg and left foot cramps, as well as occasional cramps in the bilateral hands. The cramps typically occur on and off for 2-3 days at a time before resolving. She does not  take nor has a history of taking iron supplements.   She denies any change sin blood pressure medications. She is taking all medications as prescribed. Traci Fuller notes occasional ankle swellings when active for long periods of time.   Traci Fuller is not being seen by nephrology for elevated creatinine, nor does she wish to.   She is taking Anastrozole as prescribed and tolerating it well. She denies any side effects, other than occasional hot flashes. She has her 1 year follow-up with radiation oncologist on 12/28/23.   PAST MEDICAL HISTORY:   Past Medical History: 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 DES to midLAD, 75% stenosis of RCA not treated   Cancer [facility])    Current smoker    Essential hypertension    Family history of bladder cancer    Family history of breast cancer    Family history of leukemia    Family history of uterine cancer    Hyperlipidemia LDL goal <70    Myocardial infarction (HCC)    PONV (postoperative nausea and vomiting)    Port-A-Cath in place 10/29/2021    Surgical History: Past Surgical History:  Procedure Laterality Date   ABDOMINAL HYSTERECTOMY     BREAST BIOPSY Bilateral 10/16/2021   BREAST BIOPSY WITH RADIO FREQUENCY LOCALIZER Right 05/05/2022   Procedure: BREAST BIOPSY WITH RADIO FREQUENCY LOCALIZER;  Surgeon: Awilda Bogus, Traci Fuller;  Location: [facility];  Service: General;  Laterality: Right;   CORONARY STENT INTERVENTION N/A 01/10/2018   Procedure: CORONARY STENT INTERVENTION;  Surgeon: Lucendia Rusk, Traci Fuller;  Location: [facility] INVASIVE CV LAB;   Service: Cardiovascular;  Laterality: N/A;   LEFT HEART CATH AND CORONARY ANGIOGRAPHY N/A 01/10/2018   Procedure: LEFT HEART CATH AND CORONARY ANGIOGRAPHY;  Surgeon: Lucendia Rusk, Traci Fuller;  Location: [facility] INVASIVE CV LAB;  Service: Cardiovascular;  Laterality: N/A;   MASTECTOMY MODIFIED RADICAL Left 05/05/2022   Procedure: MASTECTOMY MODIFIED RADICAL WITH RADIO FREQUENCY LOCALIZER;  Surgeon: Awilda Bogus, Traci Fuller;  Location: [facility];  Service: General;  Laterality: Left;   PORTACATH PLACEMENT Right 10/26/2021   Procedure: INSERTION PORT-A-CATH;  Surgeon: Awilda Bogus, Traci Fuller;  Location: [facility];  Service: General;  Laterality: Right;    Social History: Social History   Socioeconomic History   Marital status: Married    Spouse name: Not on file   Number of children: Not on file   Years of education: Not on file   Highest education level: Not on file  Occupational History   Not on file  Tobacco Use   Smoking status: Every Day    Current packs/day: 1.00    Average packs/day: 1 pack/day for 49.3 years (49.3 ttl pk-yrs)    Types: Cigarettes    Start date: 1976   Smokeless tobacco: Never  Vaping Use   Vaping status: Never Used  Substance and Sexual Activity   Alcohol use: Not Currently   Drug use: Never   Sexual activity: Yes  Other Topics Concern   Not on file  Social History Narrative   Not on file   Social Drivers of Health   Financial Resource Strain: Not on file  Food Insecurity: No Food Insecurity (05/05/2022)   Hunger Vital Sign    Worried About Running Out of Food in the Last Year: Never true    Ran Out of Food in the Last Year: Never true  Transportation Needs: No Transportation Needs (05/05/2022)   PRAPARE - Transportation  Lack of Transportation (Medical): No    Lack of Transportation (Non-Medical): No  Physical Activity: Not on file  Stress: Not on file  Social Connections: Unknown (12/06/2022)   Received from [facility]   Social Network     Social Network: Not on file  Intimate Partner Violence: Unknown (12/06/2022)   Received from [facility]   HITS    Physically Hurt: Not on file    Insult or Talk Down To: Not on file    Threaten Physical Harm: Not on file    Scream or Curse: Not on file    Family History: Family History  Problem Relation Age of Onset   Bladder Cancer Mother 66   Cervical cancer Mother        dx 84s, hysterectomy   Hypertension Father    Cirrhosis Father    Leukemia Brother 7   Liver cancer Brother 8   Breast cancer Maternal Aunt        dx 4s   Cancer Maternal Uncle        unk type d. 82   Cancer Paternal Aunt        unk type   Breast cancer Maternal Grandmother        dx 65s   Uterine cancer Maternal Grandmother     Current Medications:  Current Outpatient Medications:    ALPRAZolam (XANAX) 0.25 MG tablet, TAKE 1 TABLET(0.25 MG) BY MOUTH TWICE DAILY AS NEEDED FOR ANXIETY, Disp: 60 tablet, Rfl: 0   amLODipine (NORVASC) 10 MG tablet, Take 10 mg by mouth daily., Disp: , Rfl:    anastrozole (ARIMIDEX) 1 MG tablet, TAKE 1 TABLET(1 MG) BY MOUTH DAILY, Disp: 90 tablet, Rfl: 4   aspirin EC 81 MG tablet, Take 1 tablet (81 mg total) by mouth daily., Disp: 90 tablet, Rfl: 3   Calcium Carb-Cholecalciferol (CALCIUM 600/VITAMIN D3 PO), Take 1 tablet by mouth in the morning and at bedtime., Disp: , Rfl:    ELIQUIS 5 MG TABS tablet, TAKE 1 TABLET(5 MG) BY MOUTH TWICE DAILY, Disp: 60 tablet, Rfl: 5   Evolocumab (REPATHA SURECLICK) 140 MG/ML SOAJ, Inject 140 mg into the skin every 14 (fourteen) days., Disp: 2 mL, Rfl: 11   gabapentin (NEURONTIN) 300 MG capsule, Take 300 mg by mouth 2 (two) times daily., Disp: , Rfl:    magnesium oxide (MAG-OX) 400 (240 Mg) MG tablet, TAKE 2 TABLETS(800 MG) BY MOUTH TWICE DAILY, Disp: 120 tablet, Rfl: 3   metoprolol tartrate (LOPRESSOR) 25 MG tablet, Take 1 tablet (25 mg total) by mouth 2 (two) times daily. Please call and schedule overdue appointment with  Dr. Katheryne Pane for further refills . SECOND ATTEMPT!, Disp: 30 tablet, Rfl: 0   mometasone (ELOCON) 0.1 % cream, Apply topically 2 (two) times daily., Disp: , Rfl:    omeprazole (PRILOSEC OTC) 20 MG tablet, Take 20 mg by mouth daily., Disp: , Rfl:    ondansetron (ZOFRAN-ODT) 4 MG disintegrating tablet, Take 1 tablet (4 mg total) by mouth every 6 (six) hours as needed for nausea., Disp: 20 tablet, Rfl: 0   oxyCODONE (OXY IR/ROXICODONE) 5 MG immediate release tablet, Take 1-2 tablets (5-10 mg total) by mouth every 4 (four) hours as needed for severe pain or breakthrough pain., Disp: 30 tablet, Rfl: 0   pegfilgrastim-bmez (ZIEXTENZO) 6 MG/0.6ML injection, Inject 6 mg into the skin every 21 ( twenty-one) days. AFTER chemotherapy., Disp: , Rfl:    Pertuzumab (PERJETA IV), Inject into the vein every  21 ( twenty-one) days., Disp: , Rfl:    sulfamethoxazole-trimethoprim (BACTRIM DS) 800-160 MG tablet, Take 1 tablet by mouth 2 (two) times daily., Disp: 14 tablet, Rfl: 0   trastuzumab-anns (KANJINTI) 150 MG SOLR injection, Inject 462 mg into the vein every 21 ( twenty-one) days., Disp: , Rfl:  No current facility-administered medications for this visit.  Facility-Administered Medications Ordered in Other Visits:    acetaminophen (TYLENOL) tablet 650 mg, 650 mg, Oral, Once, [name], Traci Fuller   cetirizine (ZYRTEC) tablet 10 mg, 10 mg, Oral, Once, [name], Traci Fuller   Allergies: No Known Allergies  REVIEW OF SYSTEMS:   Review of Systems  Constitutional:  Negative for chills, fatigue and fever.  HENT:   Negative for lump/mass, mouth sores, nosebleeds, sore throat and trouble swallowing.   Eyes:  Negative for eye problems.  Respiratory:  Negative for cough and shortness of breath.   Cardiovascular:  Negative for chest pain, leg swelling and palpitations.  Gastrointestinal:  Negative for abdominal pain, constipation, diarrhea, nausea and vomiting.  Genitourinary:  Negative for bladder  incontinence, difficulty urinating, dysuria, frequency, hematuria and nocturia.   Musculoskeletal:  Negative for arthralgias, back pain, flank pain, myalgias and neck pain.       +bilateral foot and left leg cramps  Skin:  Negative for itching and rash.  Neurological:  Positive for headaches and numbness (in fingers). Negative for dizziness.  Hematological:  Does not bruise/bleed easily.  Psychiatric/Behavioral:  Negative for depression, sleep disturbance and suicidal ideas. The patient is not nervous/anxious.   All other systems reviewed and are negative.    VITALS:   There were no vitals taken for this visit.  Wt Readings from Last 3 Encounters:  12/08/23 157 lb 10.1 oz (71.5 kg)  11/17/23 157 lb 14.4 oz (71.6 kg)  10/27/23 156 lb 1.4 oz (70.8 kg)    There is no height or weight on file to calculate BMI.  Performance status (ECOG): 1 - Symptomatic but completely ambulatory  PHYSICAL EXAM:   Physical Exam Vitals and nursing note reviewed. Exam conducted with a chaperone present.  Constitutional:      Appearance: Normal appearance.  Cardiovascular:     Rate and Rhythm: Normal rate and regular rhythm.     Pulses: Normal pulses.     Heart sounds: Normal heart sounds.  Pulmonary:     Effort: Pulmonary effort is normal.     Breath sounds: Normal breath sounds.  Abdominal:     Palpations: Abdomen is soft. There is no hepatomegaly, splenomegaly or mass.     Tenderness: There is no abdominal tenderness.  Musculoskeletal:     Right lower leg: No edema.     Left lower leg: No edema.  Lymphadenopathy:     Cervical: No cervical adenopathy.     Right cervical: No superficial, deep or posterior cervical adenopathy.    Left cervical: No superficial, deep or posterior cervical adenopathy.     Upper Body:     Right upper body: No supraclavicular or axillary adenopathy.     Left upper body: No supraclavicular or axillary adenopathy.  Neurological:     General: No focal deficit  present.     Mental Status: She is alert and oriented to person, place, and time.  Psychiatric:        Mood and Affect: Mood normal.        Behavior: Behavior normal.     LABS:      Latest Ref Rng & Units 12/08/2023  10:16 AM 11/17/2023    9:13 AM 10/27/2023    9:16 AM  CBC  WBC 4.0 - 10.5 K/uL 6.2  6.3  6.5   Hemoglobin 12.0 - 15.0 g/dL 56.2  13.0  86.5   Hematocrit 36.0 - 46.0 % 36.4  38.2  35.5   Platelets 150 - 400 K/uL 210  238  218       Latest Ref Rng & Units 12/08/2023   10:16 AM 11/17/2023    9:13 AM 10/27/2023    9:16 AM  CMP  Glucose 70 - 99 mg/dL 784  696  295   BUN 8 - 23 mg/dL 31  27  23    Creatinine 0.44 - 1.00 mg/dL 2.84  1.32  4.40   Sodium 135 - 145 mmol/L 136  135  138   Potassium 3.5 - 5.1 mmol/L 4.2  4.2  4.1   Chloride 98 - 111 mmol/L 103  103  105   CO2 22 - 32 mmol/L 24  24  23    Calcium 8.9 - 10.3 mg/dL 10.2  72.5  36.6   Total Protein 6.5 - 8.1 g/dL 6.9  7.1  7.3   Total Bilirubin 0.0 - 1.2 mg/dL 0.4  0.6  0.6   Alkaline Phos 38 - 126 U/L 112  118  150   AST 15 - 41 U/L 17  14  20    ALT 0 - 44 U/L 16  16  26       No results found for: "CEA1", "CEA" / No results found for: "CEA1", "CEA" No results found for: "PSA1" No results found for: "YQI347" No results found for: "CAN125"  No results found for: "TOTALPROTELP", "ALBUMINELP", "A1GS", "A2GS", "BETS", "BETA2SER", "GAMS", "MSPIKE", "SPEI" Lab Results  Component Value Date   TIBC 335 09/15/2023   FERRITIN 25 09/15/2023   IRONPCTSAT 16 09/15/2023   No results found for: "LDH"   STUDIES:   No results found.

## 2023-12-09 ENCOUNTER — Other Ambulatory Visit: Payer: Self-pay

## 2023-12-16 ENCOUNTER — Other Ambulatory Visit: Payer: Self-pay | Admitting: Hematology

## 2023-12-16 DIAGNOSIS — I82412 Acute embolism and thrombosis of left femoral vein: Secondary | ICD-10-CM

## 2023-12-18 ENCOUNTER — Encounter: Payer: Self-pay | Admitting: Hematology

## 2023-12-18 ENCOUNTER — Encounter (HOSPITAL_COMMUNITY): Payer: Self-pay | Admitting: Hematology

## 2023-12-29 ENCOUNTER — Inpatient Hospital Stay

## 2023-12-29 ENCOUNTER — Inpatient Hospital Stay: Admitting: Hematology

## 2023-12-29 ENCOUNTER — Inpatient Hospital Stay: Attending: Oncology

## 2023-12-29 VITALS — BP 120/61 | HR 55 | Temp 98.6°F | Resp 18

## 2023-12-29 DIAGNOSIS — Z95828 Presence of other vascular implants and grafts: Secondary | ICD-10-CM

## 2023-12-29 DIAGNOSIS — C50412 Malignant neoplasm of upper-outer quadrant of left female breast: Secondary | ICD-10-CM | POA: Diagnosis present

## 2023-12-29 DIAGNOSIS — Z5112 Encounter for antineoplastic immunotherapy: Secondary | ICD-10-CM | POA: Diagnosis present

## 2023-12-29 DIAGNOSIS — Z79899 Other long term (current) drug therapy: Secondary | ICD-10-CM | POA: Insufficient documentation

## 2023-12-29 DIAGNOSIS — C50212 Malignant neoplasm of upper-inner quadrant of left female breast: Secondary | ICD-10-CM | POA: Diagnosis present

## 2023-12-29 DIAGNOSIS — Z170 Estrogen receptor positive status [ER+]: Secondary | ICD-10-CM | POA: Insufficient documentation

## 2023-12-29 LAB — CBC WITH DIFFERENTIAL/PLATELET
Abs Immature Granulocytes: 0.03 10*3/uL (ref 0.00–0.07)
Basophils Absolute: 0.1 10*3/uL (ref 0.0–0.1)
Basophils Relative: 1 %
Eosinophils Absolute: 0.2 10*3/uL (ref 0.0–0.5)
Eosinophils Relative: 3 %
HCT: 37.3 % (ref 36.0–46.0)
Hemoglobin: 12.4 g/dL (ref 12.0–15.0)
Immature Granulocytes: 0 %
Lymphocytes Relative: 14 %
Lymphs Abs: 0.9 10*3/uL (ref 0.7–4.0)
MCH: 32.3 pg (ref 26.0–34.0)
MCHC: 33.2 g/dL (ref 30.0–36.0)
MCV: 97.1 fL (ref 80.0–100.0)
Monocytes Absolute: 0.4 10*3/uL (ref 0.1–1.0)
Monocytes Relative: 7 %
Neutro Abs: 5.1 10*3/uL (ref 1.7–7.7)
Neutrophils Relative %: 75 %
Platelets: 210 10*3/uL (ref 150–400)
RBC: 3.84 MIL/uL — ABNORMAL LOW (ref 3.87–5.11)
RDW: 15.2 % (ref 11.5–15.5)
WBC: 6.7 10*3/uL (ref 4.0–10.5)
nRBC: 0 % (ref 0.0–0.2)

## 2023-12-29 LAB — COMPREHENSIVE METABOLIC PANEL WITH GFR
ALT: 14 U/L (ref 0–44)
AST: 15 U/L (ref 15–41)
Albumin: 3.8 g/dL (ref 3.5–5.0)
Alkaline Phosphatase: 112 U/L (ref 38–126)
Anion gap: 10 (ref 5–15)
BUN: 35 mg/dL — ABNORMAL HIGH (ref 8–23)
CO2: 22 mmol/L (ref 22–32)
Calcium: 10.3 mg/dL (ref 8.9–10.3)
Chloride: 103 mmol/L (ref 98–111)
Creatinine, Ser: 1.49 mg/dL — ABNORMAL HIGH (ref 0.44–1.00)
GFR, Estimated: 39 mL/min — ABNORMAL LOW (ref >60)
Glucose, Bld: 132 mg/dL — ABNORMAL HIGH (ref 70–99)
Potassium: 4.4 mmol/L (ref 3.5–5.1)
Sodium: 135 mmol/L (ref 135–145)
Total Bilirubin: 0.5 mg/dL (ref 0.0–1.2)
Total Protein: 7.1 g/dL (ref 6.5–8.1)

## 2023-12-29 LAB — MAGNESIUM: Magnesium: 1.7 mg/dL (ref 1.7–2.4)

## 2023-12-29 MED ORDER — TRASTUZUMAB-ANNS CHEMO 150 MG IV SOLR
6.0000 mg/kg | Freq: Once | INTRAVENOUS | Status: AC
  Administered 2023-12-29: 420 mg via INTRAVENOUS
  Filled 2023-12-29: qty 20

## 2023-12-29 MED ORDER — SODIUM CHLORIDE 0.9% FLUSH
10.0000 mL | INTRAVENOUS | Status: DC | PRN
  Administered 2023-12-29: 10 mL

## 2023-12-29 MED ORDER — PERTUZUMAB CHEMO INJECTION 420 MG/14ML
420.0000 mg | Freq: Once | INTRAVENOUS | Status: AC
  Administered 2023-12-29: 420 mg via INTRAVENOUS
  Filled 2023-12-29: qty 14

## 2023-12-29 MED ORDER — SODIUM CHLORIDE 0.9% FLUSH
10.0000 mL | Freq: Once | INTRAVENOUS | Status: AC
  Administered 2023-12-29: 10 mL via INTRAVENOUS

## 2023-12-29 MED ORDER — HEPARIN SOD (PORK) LOCK FLUSH 100 UNIT/ML IV SOLN
500.0000 [IU] | Freq: Once | INTRAVENOUS | Status: AC | PRN
Start: 2023-12-29 — End: 2023-12-29
  Administered 2023-12-29: 500 [IU]

## 2023-12-29 MED ORDER — SODIUM CHLORIDE 0.9 % IV SOLN
Freq: Once | INTRAVENOUS | Status: AC
Start: 2023-12-29 — End: 2023-12-29

## 2023-12-29 NOTE — Patient Instructions (Signed)
 [facility] [place] [facility]  [facility]charge Instructions: Thank you for choosing [facility] to provide your oncology and hematology care.  If you have a lab appointment with the Cancer Center - please note that after April 8th, 2024, all labs will be drawn in the cancer center.  You do not have to check in or register with the main entrance as you have in the past but will complete your check-in in the cancer center.  Wear comfortable clothing and clothing appropriate for easy access to any Portacath or PICC line.   We strive to give you quality time with your provider. You may need to reschedule your appointment if you arrive late (15 or more minutes).  Arriving late affects you and other patients whose appointments are after yours.  Also, if you miss three or more appointments without notifying the office, you may be dismissed from the clinic at the provider's discretion.      For prescription refill requests, have your pharmacy contact our office and allow 72 hours for refills to be completed.    Today you received the following chemotherapy and/or immunotherapy agents kanjinti and perjeta      To help prevent nausea and vomiting after your treatment, we encourage you to take your nausea medication as directed.  BELOW ARE SYMPTOMS THAT SHOULD BE REPORTED IMMEDIATELY: *FEVER GREATER THAN 100.4 F (38 C) OR HIGHER *CHILLS OR SWEATING *NAUSEA AND VOMITING THAT IS NOT CONTROLLED WITH YOUR NAUSEA MEDICATION *UNUSUAL SHORTNESS OF BREATH *UNUSUAL BRUISING OR BLEEDING *URINARY PROBLEMS (pain or burning when urinating, or frequent urination) *BOWEL PROBLEMS (unusual diarrhea, constipation, pain near the anus) TENDERNESS IN MOUTH AND THROAT WITH OR WITHOUT PRESENCE OF ULCERS (sore throat, sores in mouth, or a toothache) UNUSUAL RASH, SWELLING OR PAIN  UNUSUAL VAGINAL DISCHARGE OR ITCHING   Items with * indicate a potential emergency and should be  followed up as soon as possible or go to the Emergency Department if any problems should occur.  Please show the CHEMOTHERAPY ALERT CARD or IMMUNOTHERAPY ALERT CARD at check-in to the Emergency Department and triage nurse.  Should you have questions after your visit or need to cancel or reschedule your appointment, please contact [facility] [place] - A DEPT OF [facility] Bridegroom [facility] [phone]  and follow the prompts.  Office hours are 8:00 a.m. to 4:30 p.m. Monday - Friday. Please note that voicemails left after 4:00 p.m. may not be returned until the following business day.  We are closed weekends and major holidays. You have access to a nurse at all times for urgent questions. Please call the main number to the clinic [phone] and follow the prompts.  For any non-urgent questions, you may also contact your provider using MyChart. We now offer e-Visits for anyone 31 and older to request care online for non-urgent symptoms. For details visit [email or web address].   Also download the MyChart app! Go to the app store, search "MyChart", open the app, select [place], and log in with your MyChart username and password.

## 2023-12-29 NOTE — Progress Notes (Signed)
 Patient took own premeds for treatment today.   Patient tolerated chemotherapy with no complaints voiced.  Side effects with management reviewed with understanding verbalized.  Port site clean and dry with no bruising or swelling noted at site.  Good blood return noted before and after administration of chemotherapy.  Band aid applied.  Patient left in satisfactory condition with VSS and no s/s of distress noted.

## 2024-01-05 ENCOUNTER — Other Ambulatory Visit: Payer: Self-pay

## 2024-01-09 ENCOUNTER — Other Ambulatory Visit: Payer: Self-pay

## 2024-01-13 NOTE — Progress Notes (Signed)
 Message sent to Eduardo Grade, MD- Patient called stating that she has been having vaginal burning and itching for a couple of months. She states that she has been using OTC Vagisil medication and at first they gave her some relief but it is not helping and burning and itching has gotten worse in the last week. Wants to know if there is anything else she can try?  Message received from Hyndavi Kandala, MD- Burning and itching makes me think if she has an infection. Please ask her to report to her primary care. If vaginal dryness is confirmed, can do low dose estrogen cream  Patient aware of Dr. Curtis Dow recommendation about seeing PCP. Patient states that she thinks it is an infection because she is having white discharge as well. Informed her to see primary care, she states that she will try but that it will probably be next week before they get her in with the holiday. Patient informed that she can go to urgent care if PCP cannot see sooner. Patient verbalized understanding and is agreeable with plan.

## 2024-01-15 LAB — COLOGUARD: COLOGUARD: POSITIVE — AB

## 2024-01-19 ENCOUNTER — Encounter: Payer: Self-pay | Admitting: Hematology

## 2024-01-19 ENCOUNTER — Inpatient Hospital Stay

## 2024-01-19 ENCOUNTER — Encounter (HOSPITAL_COMMUNITY): Payer: Self-pay | Admitting: Hematology

## 2024-01-19 VITALS — BP 115/64 | HR 65 | Temp 97.5°F | Resp 17

## 2024-01-19 DIAGNOSIS — Z170 Estrogen receptor positive status [ER+]: Secondary | ICD-10-CM

## 2024-01-19 DIAGNOSIS — Z95828 Presence of other vascular implants and grafts: Secondary | ICD-10-CM

## 2024-01-19 DIAGNOSIS — Z5112 Encounter for antineoplastic immunotherapy: Secondary | ICD-10-CM | POA: Diagnosis not present

## 2024-01-19 LAB — CBC WITH DIFFERENTIAL/PLATELET
Abs Immature Granulocytes: 0.05 10*3/uL (ref 0.00–0.07)
Basophils Absolute: 0 10*3/uL (ref 0.0–0.1)
Basophils Relative: 1 %
Eosinophils Absolute: 0.2 10*3/uL (ref 0.0–0.5)
Eosinophils Relative: 4 %
HCT: 37.2 % (ref 36.0–46.0)
Hemoglobin: 11.9 g/dL — ABNORMAL LOW (ref 12.0–15.0)
Immature Granulocytes: 1 %
Lymphocytes Relative: 13 %
Lymphs Abs: 0.8 10*3/uL (ref 0.7–4.0)
MCH: 30.8 pg (ref 26.0–34.0)
MCHC: 32 g/dL (ref 30.0–36.0)
MCV: 96.4 fL (ref 80.0–100.0)
Monocytes Absolute: 0.5 10*3/uL (ref 0.1–1.0)
Monocytes Relative: 9 %
Neutro Abs: 4.2 10*3/uL (ref 1.7–7.7)
Neutrophils Relative %: 72 %
Platelets: 211 10*3/uL (ref 150–400)
RBC: 3.86 MIL/uL — ABNORMAL LOW (ref 3.87–5.11)
RDW: 14.9 % (ref 11.5–15.5)
WBC: 5.8 10*3/uL (ref 4.0–10.5)
nRBC: 0 % (ref 0.0–0.2)

## 2024-01-19 LAB — COMPREHENSIVE METABOLIC PANEL WITH GFR
ALT: 14 U/L (ref 0–44)
AST: 17 U/L (ref 15–41)
Albumin: 3.6 g/dL (ref 3.5–5.0)
Alkaline Phosphatase: 124 U/L (ref 38–126)
Anion gap: 8 (ref 5–15)
BUN: 27 mg/dL — ABNORMAL HIGH (ref 8–23)
CO2: 24 mmol/L (ref 22–32)
Calcium: 9.9 mg/dL (ref 8.9–10.3)
Chloride: 105 mmol/L (ref 98–111)
Creatinine, Ser: 1.33 mg/dL — ABNORMAL HIGH (ref 0.44–1.00)
GFR, Estimated: 44 mL/min — ABNORMAL LOW (ref >60)
Glucose, Bld: 91 mg/dL (ref 70–99)
Potassium: 4.3 mmol/L (ref 3.5–5.1)
Sodium: 137 mmol/L (ref 135–145)
Total Bilirubin: 0.6 mg/dL (ref 0.0–1.2)
Total Protein: 7 g/dL (ref 6.5–8.1)

## 2024-01-19 LAB — MAGNESIUM: Magnesium: 1.5 mg/dL — ABNORMAL LOW (ref 1.7–2.4)

## 2024-01-19 MED ORDER — TRASTUZUMAB-ANNS CHEMO 150 MG IV SOLR
6.0000 mg/kg | Freq: Once | INTRAVENOUS | Status: AC
  Administered 2024-01-19: 420 mg via INTRAVENOUS
  Filled 2024-01-19: qty 20

## 2024-01-19 MED ORDER — HEPARIN SOD (PORK) LOCK FLUSH 100 UNIT/ML IV SOLN
500.0000 [IU] | Freq: Once | INTRAVENOUS | Status: AC | PRN
  Administered 2024-01-19: 500 [IU]

## 2024-01-19 MED ORDER — SODIUM CHLORIDE 0.9% FLUSH
10.0000 mL | INTRAVENOUS | Status: DC | PRN
  Administered 2024-01-19: 10 mL

## 2024-01-19 MED ORDER — SODIUM CHLORIDE 0.9 % IV SOLN
420.0000 mg | Freq: Once | INTRAVENOUS | Status: AC
  Administered 2024-01-19: 420 mg via INTRAVENOUS
  Filled 2024-01-19: qty 14

## 2024-01-19 MED ORDER — SODIUM CHLORIDE 0.9 % IV SOLN: Freq: Once | INTRAVENOUS | Status: AC

## 2024-01-19 MED ORDER — MAGNESIUM SULFATE 2 GM/50ML IV SOLN
2.0000 g | Freq: Once | INTRAVENOUS | Status: AC
  Administered 2024-01-19: 2 g via INTRAVENOUS
  Filled 2024-01-19: qty 50

## 2024-01-19 MED ORDER — CETIRIZINE HCL 10 MG PO TABS: 10.0000 mg | ORAL_TABLET | Freq: Once | ORAL | Status: DC

## 2024-01-19 MED ORDER — ACETAMINOPHEN 325 MG PO TABS
650.0000 mg | ORAL_TABLET | Freq: Once | ORAL | Status: DC
Start: 2024-01-19 — End: 2024-01-19

## 2024-01-19 NOTE — Progress Notes (Signed)
 Patient presents today for Kanjinit/Perjeta  infusion. Patient denies any side effects related to last treatment. Magnesium  sulfate 1.5. Standing orders followed. Patient will receive 2 grams of magnesium  sulfate x 1 dose today IV.   Treatment given today per MD orders. Tolerated infusion without adverse affects. Vital signs stable. No complaints at this time. Discharged from clinic ambulatory in stable condition. Alert and oriented x 3. F/U with [facility] as scheduled.

## 2024-02-02 ENCOUNTER — Encounter: Payer: Self-pay | Admitting: Hematology

## 2024-02-02 ENCOUNTER — Encounter (HOSPITAL_COMMUNITY): Payer: Self-pay | Admitting: Hematology

## 2024-02-07 ENCOUNTER — Other Ambulatory Visit: Payer: Self-pay

## 2024-02-09 ENCOUNTER — Encounter: Payer: Self-pay | Admitting: Hematology

## 2024-02-09 ENCOUNTER — Inpatient Hospital Stay: Attending: Oncology

## 2024-02-09 ENCOUNTER — Inpatient Hospital Stay

## 2024-02-09 VITALS — BP 133/67 | HR 53 | Temp 97.0°F | Resp 19

## 2024-02-09 DIAGNOSIS — C50412 Malignant neoplasm of upper-outer quadrant of left female breast: Secondary | ICD-10-CM | POA: Insufficient documentation

## 2024-02-09 DIAGNOSIS — Z95828 Presence of other vascular implants and grafts: Secondary | ICD-10-CM

## 2024-02-09 DIAGNOSIS — Z170 Estrogen receptor positive status [ER+]: Secondary | ICD-10-CM | POA: Diagnosis not present

## 2024-02-09 DIAGNOSIS — C50212 Malignant neoplasm of upper-inner quadrant of left female breast: Secondary | ICD-10-CM | POA: Diagnosis present

## 2024-02-09 DIAGNOSIS — Z5112 Encounter for antineoplastic immunotherapy: Secondary | ICD-10-CM | POA: Insufficient documentation

## 2024-02-09 DIAGNOSIS — Z79899 Other long term (current) drug therapy: Secondary | ICD-10-CM | POA: Insufficient documentation

## 2024-02-09 LAB — CBC WITH DIFFERENTIAL/PLATELET
Abs Immature Granulocytes: 0.02 10*3/uL (ref 0.00–0.07)
Basophils Absolute: 0 10*3/uL (ref 0.0–0.1)
Basophils Relative: 1 %
Eosinophils Absolute: 0.2 10*3/uL (ref 0.0–0.5)
Eosinophils Relative: 4 %
HCT: 36.6 % (ref 36.0–46.0)
Hemoglobin: 12 g/dL (ref 12.0–15.0)
Immature Granulocytes: 0 %
Lymphocytes Relative: 15 %
Lymphs Abs: 0.9 10*3/uL (ref 0.7–4.0)
MCH: 31.5 pg (ref 26.0–34.0)
MCHC: 32.8 g/dL (ref 30.0–36.0)
MCV: 96.1 fL (ref 80.0–100.0)
Monocytes Absolute: 0.4 10*3/uL (ref 0.1–1.0)
Monocytes Relative: 7 %
Neutro Abs: 4.3 10*3/uL (ref 1.7–7.7)
Neutrophils Relative %: 73 %
Platelets: 201 10*3/uL (ref 150–400)
RBC: 3.81 MIL/uL — ABNORMAL LOW (ref 3.87–5.11)
RDW: 15 % (ref 11.5–15.5)
WBC: 5.9 10*3/uL (ref 4.0–10.5)
nRBC: 0 % (ref 0.0–0.2)

## 2024-02-09 LAB — COMPREHENSIVE METABOLIC PANEL WITH GFR
ALT: 13 U/L (ref 0–44)
AST: 16 U/L (ref 15–41)
Albumin: 3.6 g/dL (ref 3.5–5.0)
Alkaline Phosphatase: 101 U/L (ref 38–126)
Anion gap: 9 (ref 5–15)
BUN: 29 mg/dL — ABNORMAL HIGH (ref 8–23)
CO2: 23 mmol/L (ref 22–32)
Calcium: 10.1 mg/dL (ref 8.9–10.3)
Chloride: 104 mmol/L (ref 98–111)
Creatinine, Ser: 1.59 mg/dL — ABNORMAL HIGH (ref 0.44–1.00)
GFR, Estimated: 36 mL/min — ABNORMAL LOW (ref >60)
Glucose, Bld: 108 mg/dL — ABNORMAL HIGH (ref 70–99)
Potassium: 4.1 mmol/L (ref 3.5–5.1)
Sodium: 136 mmol/L (ref 135–145)
Total Bilirubin: 0.7 mg/dL (ref 0.0–1.2)
Total Protein: 6.9 g/dL (ref 6.5–8.1)

## 2024-02-09 LAB — MAGNESIUM: Magnesium: 1.6 mg/dL — ABNORMAL LOW (ref 1.7–2.4)

## 2024-02-09 MED ORDER — SODIUM CHLORIDE 0.9 % IV SOLN: Freq: Once | INTRAVENOUS | Status: AC

## 2024-02-09 MED ORDER — MAGNESIUM SULFATE 2 GM/50ML IV SOLN
2.0000 g | Freq: Once | INTRAVENOUS | Status: AC
  Administered 2024-02-09: 2 g via INTRAVENOUS
  Filled 2024-02-09: qty 50

## 2024-02-09 MED ORDER — CETIRIZINE HCL 10 MG PO TABS
10.0000 mg | ORAL_TABLET | Freq: Once | ORAL | Status: DC
Start: 2024-02-09 — End: 2024-02-09

## 2024-02-09 MED ORDER — SODIUM CHLORIDE 0.9 % IV SOLN
420.0000 mg | Freq: Once | INTRAVENOUS | Status: AC
  Administered 2024-02-09: 420 mg via INTRAVENOUS
  Filled 2024-02-09: qty 14

## 2024-02-09 MED ORDER — HEPARIN SOD (PORK) LOCK FLUSH 100 UNIT/ML IV SOLN
500.0000 [IU] | Freq: Once | INTRAVENOUS | Status: AC | PRN
  Administered 2024-02-09: 500 [IU]

## 2024-02-09 MED ORDER — TRASTUZUMAB-ANNS CHEMO 150 MG IV SOLR
6.0000 mg/kg | Freq: Once | INTRAVENOUS | Status: AC
  Administered 2024-02-09: 420 mg via INTRAVENOUS
  Filled 2024-02-09: qty 20

## 2024-02-09 MED ORDER — SODIUM CHLORIDE 0.9% FLUSH
10.0000 mL | INTRAVENOUS | Status: DC | PRN
  Administered 2024-02-09: 10 mL via INTRAVENOUS

## 2024-02-09 MED ORDER — ACETAMINOPHEN 325 MG PO TABS: 650.0000 mg | ORAL_TABLET | Freq: Once | ORAL | Status: DC

## 2024-02-09 NOTE — Progress Notes (Signed)
 Mag 1.6. Will proceed with magnesium  2g IV per standing orders.   Patient tolerated chemotherapy with no complaints voiced.  Side effects with management reviewed with understanding verbalized.  Port site clean and dry with no bruising or swelling noted at site.  Good blood return noted before and after administration of chemotherapy.  Band aid applied.  Patient left in satisfactory condition with VSS and no s/s of distress noted. All follow ups as scheduled.   Tannya [employer]

## 2024-02-09 NOTE — Patient Instructions (Signed)
 [facility] [place] - A DEPT OF [facility]  Discharge Instructions: Thank you for choosing [facility] to provide your oncology and hematology care.  If you have a lab appointment with the Cancer Center - please note that after April 8th, 2024, all labs will be drawn in the cancer center.  You do not have to check in or register with the main entrance as you have in the past but will complete your check-in in the cancer center.  Wear comfortable clothing and clothing appropriate for easy access to any Portacath or PICC line.   We strive to give you quality time with your provider. You may need to reschedule your appointment if you arrive late (15 or more minutes).  Arriving late affects you and other patients whose appointments are after yours.  Also, if you miss three or more appointments without notifying the office, you may be dismissed from the clinic at the provider's discretion.      For prescription refill requests, have your pharmacy contact our office and allow 72 hours for refills to be completed.    Today you received the following chemotherapy and/or immunotherapy agents kanjiniti and perjeta     To help prevent nausea and vomiting after your treatment, we encourage you to take your nausea medication as directed.  BELOW ARE SYMPTOMS THAT SHOULD BE REPORTED IMMEDIATELY: *FEVER GREATER THAN 100.4 F (38 C) OR HIGHER *CHILLS OR SWEATING *NAUSEA AND VOMITING THAT IS NOT CONTROLLED WITH YOUR NAUSEA MEDICATION *UNUSUAL SHORTNESS OF BREATH *UNUSUAL BRUISING OR BLEEDING *URINARY PROBLEMS (pain or burning when urinating, or frequent urination) *BOWEL PROBLEMS (unusual diarrhea, constipation, pain near the anus) TENDERNESS IN MOUTH AND THROAT WITH OR WITHOUT PRESENCE OF ULCERS (sore throat, sores in mouth, or a toothache) UNUSUAL RASH, SWELLING OR PAIN  UNUSUAL VAGINAL DISCHARGE OR ITCHING   Items with * indicate a potential emergency and should be  followed up as soon as possible or go to the Emergency Department if any problems should occur.  Please show the CHEMOTHERAPY ALERT CARD or IMMUNOTHERAPY ALERT CARD at check-in to the Emergency Department and triage nurse.  Should you have questions after your visit or need to cancel or reschedule your appointment, please contact [facility] [place] - A DEPT OF [facility] [phone]  and follow the prompts.  Office hours are 8:00 a.m. to 4:30 p.m. Monday - Friday. Please note that voicemails left after 4:00 p.m. may not be returned until the following business day.  We are closed weekends and major holidays. You have access to a nurse at all times for urgent questions. Please call the main number to the clinic [phone] and follow the prompts.  For any non-urgent questions, you may also contact your provider using MyChart. We now offer e-Visits for anyone 65 and older to request care online for non-urgent symptoms. For details visit [email or web address].   Also download the MyChart app! Go to the app store, search MyChart, open the app, select [place], and log in with your MyChart username and password.

## 2024-02-16 ENCOUNTER — Ambulatory Visit (HOSPITAL_COMMUNITY)
Admission: RE | Admit: 2024-02-16 | Discharge: 2024-02-16 | Disposition: A | Discharge status: Home / Self Care | Source: Ambulatory Visit | Attending: Hematology | Admitting: Hematology

## 2024-02-16 DIAGNOSIS — Z170 Estrogen receptor positive status [ER+]: Secondary | ICD-10-CM | POA: Insufficient documentation

## 2024-02-16 DIAGNOSIS — C50812 Malignant neoplasm of overlapping sites of left female breast: Secondary | ICD-10-CM | POA: Insufficient documentation

## 2024-02-16 MED ORDER — IOHEXOL 300 MG/ML  SOLN
75.0000 mL | Freq: Once | INTRAMUSCULAR | Status: AC | PRN
Start: 2024-02-16 — End: 2024-02-16
  Administered 2024-02-16: 75 mL via INTRAVENOUS

## 2024-02-23 ENCOUNTER — Ambulatory Visit (HOSPITAL_COMMUNITY)

## 2024-03-01 ENCOUNTER — Inpatient Hospital Stay

## 2024-03-01 ENCOUNTER — Inpatient Hospital Stay: Attending: Oncology | Admitting: Hematology

## 2024-03-01 ENCOUNTER — Encounter: Payer: Self-pay | Admitting: Hematology

## 2024-03-01 VITALS — BP 127/74 | HR 60 | Temp 96.7°F | Resp 20 | Wt 162.0 lb

## 2024-03-01 VITALS — BP 143/79 | HR 58 | Temp 98.0°F | Resp 20

## 2024-03-01 DIAGNOSIS — Z170 Estrogen receptor positive status [ER+]: Secondary | ICD-10-CM | POA: Diagnosis not present

## 2024-03-01 DIAGNOSIS — F17210 Nicotine dependence, cigarettes, uncomplicated: Secondary | ICD-10-CM | POA: Insufficient documentation

## 2024-03-01 DIAGNOSIS — Z86718 Personal history of other venous thrombosis and embolism: Secondary | ICD-10-CM | POA: Diagnosis not present

## 2024-03-01 DIAGNOSIS — C50812 Malignant neoplasm of overlapping sites of left female breast: Secondary | ICD-10-CM

## 2024-03-01 DIAGNOSIS — C50912 Malignant neoplasm of unspecified site of left female breast: Secondary | ICD-10-CM | POA: Diagnosis not present

## 2024-03-01 DIAGNOSIS — C50412 Malignant neoplasm of upper-outer quadrant of left female breast: Secondary | ICD-10-CM | POA: Diagnosis present

## 2024-03-01 DIAGNOSIS — C773 Secondary and unspecified malignant neoplasm of axilla and upper limb lymph nodes: Secondary | ICD-10-CM | POA: Diagnosis not present

## 2024-03-01 DIAGNOSIS — Z79811 Long term (current) use of aromatase inhibitors: Secondary | ICD-10-CM | POA: Diagnosis not present

## 2024-03-01 DIAGNOSIS — C7802 Secondary malignant neoplasm of left lung: Secondary | ICD-10-CM

## 2024-03-01 DIAGNOSIS — Z95828 Presence of other vascular implants and grafts: Secondary | ICD-10-CM

## 2024-03-01 DIAGNOSIS — D649 Anemia, unspecified: Secondary | ICD-10-CM | POA: Insufficient documentation

## 2024-03-01 DIAGNOSIS — Z79899 Other long term (current) drug therapy: Secondary | ICD-10-CM | POA: Insufficient documentation

## 2024-03-01 DIAGNOSIS — C50512 Malignant neoplasm of lower-outer quadrant of left female breast: Secondary | ICD-10-CM | POA: Insufficient documentation

## 2024-03-01 DIAGNOSIS — Z7901 Long term (current) use of anticoagulants: Secondary | ICD-10-CM | POA: Insufficient documentation

## 2024-03-01 DIAGNOSIS — Z5112 Encounter for antineoplastic immunotherapy: Secondary | ICD-10-CM | POA: Diagnosis present

## 2024-03-01 DIAGNOSIS — M7989 Other specified soft tissue disorders: Secondary | ICD-10-CM

## 2024-03-01 DIAGNOSIS — C50212 Malignant neoplasm of upper-inner quadrant of left female breast: Secondary | ICD-10-CM | POA: Diagnosis present

## 2024-03-01 DIAGNOSIS — M79605 Pain in left leg: Secondary | ICD-10-CM | POA: Diagnosis not present

## 2024-03-01 LAB — CBC WITH DIFFERENTIAL/PLATELET
Abs Immature Granulocytes: 0.02 K/uL (ref 0.00–0.07)
Basophils Absolute: 0.1 K/uL (ref 0.0–0.1)
Basophils Relative: 1 %
Eosinophils Absolute: 0.3 K/uL (ref 0.0–0.5)
Eosinophils Relative: 6 %
HCT: 35.7 % — ABNORMAL LOW (ref 36.0–46.0)
Hemoglobin: 11.3 g/dL — ABNORMAL LOW (ref 12.0–15.0)
Immature Granulocytes: 0 %
Lymphocytes Relative: 20 %
Lymphs Abs: 1.2 K/uL (ref 0.7–4.0)
MCH: 30.6 pg (ref 26.0–34.0)
MCHC: 31.7 g/dL (ref 30.0–36.0)
MCV: 96.7 fL (ref 80.0–100.0)
Monocytes Absolute: 0.5 K/uL (ref 0.1–1.0)
Monocytes Relative: 8 %
Neutro Abs: 4 K/uL (ref 1.7–7.7)
Neutrophils Relative %: 65 %
Platelets: 182 K/uL (ref 150–400)
RBC: 3.69 MIL/uL — ABNORMAL LOW (ref 3.87–5.11)
RDW: 15 % (ref 11.5–15.5)
WBC: 6 K/uL (ref 4.0–10.5)
nRBC: 0 % (ref 0.0–0.2)

## 2024-03-01 LAB — COMPREHENSIVE METABOLIC PANEL WITH GFR
ALT: 16 U/L (ref 0–44)
AST: 18 U/L (ref 15–41)
Albumin: 3.7 g/dL (ref 3.5–5.0)
Alkaline Phosphatase: 103 U/L (ref 38–126)
Anion gap: 10 (ref 5–15)
BUN: 32 mg/dL — ABNORMAL HIGH (ref 8–23)
CO2: 22 mmol/L (ref 22–32)
Calcium: 9.9 mg/dL (ref 8.9–10.3)
Chloride: 105 mmol/L (ref 98–111)
Creatinine, Ser: 1.65 mg/dL — ABNORMAL HIGH (ref 0.44–1.00)
GFR, Estimated: 34 mL/min — ABNORMAL LOW (ref >60)
Glucose, Bld: 124 mg/dL — ABNORMAL HIGH (ref 70–99)
Potassium: 4.1 mmol/L (ref 3.5–5.1)
Sodium: 137 mmol/L (ref 135–145)
Total Bilirubin: 0.5 mg/dL (ref 0.0–1.2)
Total Protein: 6.8 g/dL (ref 6.5–8.1)

## 2024-03-01 LAB — MAGNESIUM: Magnesium: 1.4 mg/dL — ABNORMAL LOW (ref 1.7–2.4)

## 2024-03-01 MED ORDER — TRASTUZUMAB-ANNS CHEMO 150 MG IV SOLR
6.0000 mg/kg | Freq: Once | INTRAVENOUS | Status: AC
  Administered 2024-03-01: 420 mg via INTRAVENOUS
  Filled 2024-03-01: qty 20

## 2024-03-01 MED ORDER — MAGNESIUM SULFATE 4 GM/100ML IV SOLN
4.0000 g | Freq: Once | INTRAVENOUS | Status: AC
  Administered 2024-03-01: 4 g via INTRAVENOUS
  Filled 2024-03-01: qty 100

## 2024-03-01 MED ORDER — SODIUM CHLORIDE 0.9 % IV SOLN: Freq: Once | INTRAVENOUS | Status: AC

## 2024-03-01 MED ORDER — HEPARIN SOD (PORK) LOCK FLUSH 100 UNIT/ML IV SOLN
500.0000 [IU] | Freq: Once | INTRAVENOUS | Status: AC | PRN
  Administered 2024-03-01: 500 [IU]

## 2024-03-01 MED ORDER — ACETAMINOPHEN 325 MG PO TABS: 650.0000 mg | ORAL_TABLET | Freq: Once | ORAL | Status: DC

## 2024-03-01 MED ORDER — CETIRIZINE HCL 10 MG PO TABS
10.0000 mg | ORAL_TABLET | Freq: Once | ORAL | Status: DC
Start: 2024-03-01 — End: 2024-03-01

## 2024-03-01 MED ORDER — SODIUM CHLORIDE 0.9% FLUSH
10.0000 mL | INTRAVENOUS | Status: DC | PRN
  Administered 2024-03-01: 10 mL via INTRAVENOUS

## 2024-03-01 MED ORDER — SODIUM CHLORIDE 0.9 % IV SOLN
420.0000 mg | Freq: Once | INTRAVENOUS | Status: AC
  Administered 2024-03-01: 420 mg via INTRAVENOUS
  Filled 2024-03-01: qty 14

## 2024-03-01 NOTE — Patient Instructions (Signed)
 [facility] [place] - [facility]  Discharge Instructions: Thank you for choosing [facility] to provide your oncology and hematology care.  If you have a lab appointment with the Cancer Center - please note that after April 8th, 2024, all labs will be drawn in the cancer center.  You do not have to check in or register with the main entrance as you have in the past but will complete your check-in in the cancer center.  Wear comfortable clothing and clothing appropriate for easy access to any Portacath or PICC line.   We strive to give you quality time with your provider. You may need to reschedule your appointment if you arrive late (15 or more minutes).  Arriving late affects you and other patients whose appointments are after yours.  Also, if you miss three or more appointments without notifying the office, you may be dismissed from the clinic at the provider's discretion.      For prescription refill requests, have your pharmacy contact our office and allow 72 hours for refills to be completed.    Today you received the following chemotherapy and/or immunotherapy agents kanjinti, perjeta.       To help prevent nausea and vomiting after your treatment, we encourage you to take your nausea medication as directed.  BELOW ARE SYMPTOMS THAT SHOULD BE REPORTED IMMEDIATELY: *FEVER GREATER THAN 100.4 F (38 C) OR HIGHER *CHILLS OR SWEATING *NAUSEA AND VOMITING THAT IS NOT CONTROLLED WITH YOUR NAUSEA MEDICATION *UNUSUAL SHORTNESS OF BREATH *UNUSUAL BRUISING OR BLEEDING *URINARY PROBLEMS (pain or burning when urinating, or frequent urination) *BOWEL PROBLEMS (unusual diarrhea, constipation, pain near the anus) TENDERNESS IN MOUTH AND THROAT WITH OR WITHOUT PRESENCE OF ULCERS (sore throat, sores in mouth, or a toothache) UNUSUAL RASH, SWELLING OR PAIN  UNUSUAL VAGINAL DISCHARGE OR ITCHING   Items with * indicate a potential emergency and should be  followed up as soon as possible or go to the Emergency Department if any problems should occur.  Please show the CHEMOTHERAPY ALERT CARD or IMMUNOTHERAPY ALERT CARD at check-in to the Emergency Department and triage nurse.  Should you have questions after your visit or need to cancel or reschedule your appointment, please contact [facility] [place] - A DEPT OF [facility] [phone]  and follow the prompts.  Office hours are 8:00 a.m. to 4:30 p.m. Monday - Friday. Please note that voicemails left after 4:00 p.m. may not be returned until the following business day.  We are closed weekends and major holidays. You have access to a nurse at all times for urgent questions. Please call the main number to the clinic [phone] and follow the prompts.  For any non-urgent questions, you may also contact your provider using MyChart. We now offer e-Visits for anyone 26 and older to request care online for non-urgent symptoms. For details visit [email or web address].   Also download the MyChart app! Go to the app store, search "MyChart", open the app, select [facility] [place], and log in with your MyChart username and password.

## 2024-03-01 NOTE — Progress Notes (Signed)
 Patients port flushed without difficulty.  Good blood return noted with no bruising or swelling noted at site.  Patient remains accessed for treatment.

## 2024-03-01 NOTE — Progress Notes (Signed)
 [facility] [address] [place] [address]    Clinic Day:  03/01/24   Referring physician: Shona Norleen PEDLAR, MD  Patient Care Team: Shona Norleen PEDLAR, MD as PCP - General (Internal Medicine) Court Dorn PARAS, MD as PCP - Cardiology (Cardiology) Rogers Hai, MD as Medical Oncologist (Medical Oncology) Celestia Joesph SQUIBB, RN as Oncology Nurse Navigator (Medical Oncology)   ASSESSMENT & PLAN:   Assessment: 1. 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 - She noticed lump has gotten bigger and deformed since September 2022.  She has itching over the skin of the breast. - Left breast 4:00 biopsy: Invasive mammary carcinoma, grade 2, ER 100%, PR 70%, Ki-67 25%, HER2 FISH positive - Left breast 2:00 biopsy: Invasive ductal carcinoma, grade 2, ER 100%, PR 70%, Ki-67 25%, HER2 negative - Left axillary lymph node biopsy: IDC, ER 100%, PR 50%, HER2 negative, E-cadherin positive - Bilateral breast MRI on 10/09/2021: Multiple enhancing masses with intervening non-mass enhancement involving most of the LOQ and part of the UOQ.  Abnormal enhancement spans approximately 12.4 x 6.4 x 7 cm.  Diffuse skin thickening/edema.  Multiple left axillary lymph nodes, largest node measuring approximately 2.4 cm in the lower axilla.  Indeterminate non-mass enhancement inferior to the biopsy site in the subareolar right breast measuring 1.5 x 0.9 x 0.8 cm. - Right breast biopsy UOQ on 10/01/2021: Fibroadenomatoid nodule with calcifications, fibrocystic changes including apocrine metaplasia with no malignancy. -PET scan from 10/22/2021: Large left breast mass and numerous hypermetabolic left axillary and subpectoral lymph nodes.  2 hypermetabolic left upper lobe lung nodules measuring 7 mm with SUV 4.2 and 3.2 respectively.  No other hypermetabolic lung nodules.  No distant metastatic disease.  Nonspecific low-level subareolar activity in the right  breast near the surgical clips and a single mildly hypermetabolic right axillary lymph node. - Echocardiogram shows EF 60 to 65%. - She has oligometastatic disease with 2 small lung nodules.  Biopsy is not feasible.  I have recommended TCHP regimen followed by restaging after 3 cycles. -6 cycles of dose reduced TCHP from 11/04/2021 through 03/11/2022 - Maintenance Herceptin  and pertuzumab  started on 04/01/2022, anastrozole  started on 04/01/2022 -Left mastectomy and lymph node dissection (05/05/2022): [id number], residual IDC, grade 2, discontinuously involving fibrotic area measuring 5.9 cm, margins negative, LVI positive, response to therapy seen including hyalin fibrosis and dystrophic calcifications.  9/18 lymph nodes positive, largest focus of metastatic carcinoma measures 1.2 cm with extranodal extension.  Right breast lumpectomy shows atypical lobular hyperplasia. - We discussed switching to Kadcyla in the adjuvant setting.  Related to efficacy of Kadcyla compared with pertuzumab  and trastuzumab  in the adjuvant setting is unknown.  Patient is reluctant to consider switching as she had poor tolerance with the prior chemotherapy.  Hence we have continued Herceptin  and Perjeta . - XRT to the chest wall from 09/13/2022   2. Social/family history: - She lives at home with her husband and daughter.  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  Current active smoker, half pack per day since age 68. - Mother had cervical cancer and bladder cancer.  Maternal grandmother had ovarian cancer.  Brother died at age 59 from acute leukemia.  Another brother died of liver cancer.  Maternal half aunt had breast cancer.    Plan: 1. Oligometastatic (two 7 mm lung lesions) HER2 positive left breast cancer: - She is tolerating Herceptin  and pertuzumab  very well. -  We reviewed labs from 03/01/2024: Creatinine 1.65 and stable.  LFTs are normal.  CBC is grossly normal.  CT CAP on 02/16/2024: Stable exam without  evidence of new or progressive disease.  Right lower lobe and left lower lobe 5 mm nodules are stable.  Previously PET positive two left upper lobe lung nodules have completely resolved. - She will proceed with Herceptin  and pertuzumab .  I will schedule her for PET CT scan to see if the right lower lobe and left lower lobe nodules are PET positive. - RTC 3 weeks for follow-up. - She has left leg swelling since leaving for trip to [place] .  Will obtain left leg Doppler.   2.  Right facial shingles/postherpetic right hemifacial pain: - Continue gabapentin  300 mg twice daily.  Symptoms are stable.  3.  Hypomagnesemia: - She was taking magnesium  2 tablets 3 times daily.  She has not taken it during her recent trip.  Magnesium  is low today.  She will receive IV magnesium .  4.  High risk drug monitoring: - Echo on 09/09/2023 with LVEF 55 to 60% and stable.  She does not have any signs or symptoms of PND or orthopnea.  I will obtain 2D echocardiogram prior to next visit.  5.  Normocytic anemia: - Last Venofer  500 mg on 09/22/2023.  Hemoglobin stable between 11-12.  6.  Left leg DVT (diagnosed 03/04/2022): - Continue Eliquis  twice daily.  No bleeding issues reported.  7.  Osteopenia (DEXA 04/19/2022 T score -2.1): - Continue calcium  and vitamin D supplements.    Orders Placed This Encounter  Procedures   US  Venous Img Lower Unilateral Left    Standing Status:   Future    Expected Date:   03/08/2024    Expiration Date:   03/01/2025    Reason for Exam (SYMPTOM  OR DIAGNOSIS REQUIRED):   r/o DVT    Preferred imaging location?:   [facility]   NM PET Image Restag (PS) Skull Base To Thigh    Standing Status:   Future    Expected Date:   03/15/2024    Expiration Date:   03/01/2025    If indicated for the ordered procedure, I authorize the administration of a radiopharmaceutical per Radiology protocol:   Yes    Preferred imaging location?:   [facility]   ECHOCARDIOGRAM COMPLETE     Standing Status:   Future    Expected Date:   03/08/2024    Expiration Date:   03/01/2025    Where should this test be performed:   [facility]    Perflutren DEFINITY (image enhancing agent) should be administered unless hypersensitivity or allergy exist:   Administer Perflutren    Reason for exam-Echo:   Chemo  Z09      I,Helena R Teague,acting as a scribe for Alean Stands, MD.,have documented all relevant documentation on the behalf of Alean Stands, MD,as directed by  Alean Stands, MD while in the presence of Alean Stands, MD.  I, Alean Stands MD, have reviewed the above documentation for accuracy and completeness, and I agree with the above.      Alean Stands, MD   7/10/20252:23 PM  CHIEF COMPLAINT:   Diagnosis: locally advanced left breast cancer    Cancer Staging  Breast cancer, left [facility]) Staging form: Breast, AJCC 8th Edition - Clinical stage from 10/15/2021: Stage IIA (cT3, cN1, cM0, G2, ER+, PR+, HER2+) - Unsigned    Prior Therapy: TCHP x 6 cycles   Current Therapy:  Anastrozole   daily, trastuzumab  and Herceptin  every 21 days    HISTORY OF PRESENT ILLNESS:   Oncology History  Breast cancer, left (HCC)  10/15/2021 Initial Diagnosis   Breast cancer, left (HCC)   11/04/2021 - 04/22/2022 Chemotherapy   Patient is on Treatment Plan : BREAST  Docetaxel  + Carboplatin  + Trastuzumab  + Pertuzumab   (TCHP) q21d       Genetic Testing   Negative genetic testing. No pathogenic variants identified on the [facility] Multi-Cancer+RNA panel. The report date is 11/02/2021.   The Multi-Cancer Panel + RNA offered by [facility] includes sequencing and/or deletion duplication testing of the following 84 genes: AIP, ALK, APC, ATM, AXIN2,BAP1,  BARD1, BLM, BMPR1A, BRCA1, BRCA2, BRIP1, CASR, CDC73, CDH1, CDK4, CDKN1B, CDKN1C, CDKN2A (p14ARF), CDKN2A (p16INK4a), CEBPA, CHEK2, CTNNA1, DICER1, DIS3L2, EGFR (c.2369C>T, p.Thr790Met variant only), EPCAM  (Deletion/duplication testing only), FH, FLCN, GATA2, GPC3, GREM1 (Promoter region deletion/duplication testing only), HOXB13 (c.251G>A, p.Gly84Glu), HRAS, KIT, MAX, MEN1, MET, MITF (c.952G>A, p.Glu318Lys variant only), MLH1, MSH2, MSH3, MSH6, MUTYH, NBN, NF1, NF2, NTHL1, PALB2, PDGFRA, PHOX2B, PMS2, POLD1, POLE, POT1, PRKAR1A, PTCH1, PTEN, RAD50, RAD51C, RAD51D, RB1, RECQL4, RET, RUNX1, SDHAF2, SDHA (sequence changes only), SDHB, SDHC, SDHD, SMAD4, SMARCA4, SMARCB1, SMARCE1, STK11, SUFU, TERC, TERT, TMEM127, TP53, TSC1, TSC2, VHL, WRN and WT1.   11/04/2021 -  Chemotherapy   Patient is on Treatment Plan : BREAST  Docetaxel  + Carboplatin  + Trastuzumab  + Pertuzumab   (TCHP) q21d / Trastuzumab  + Pertuzumab  q21d        INTERVAL HISTORY:   Traci Fuller is a 68 y.o. female presenting to clinic today for follow up of locally advanced left breast cancer. She was last seen by me on 12/08/23.  Since her last visit, she underwent CT CAP on 02/16/24 that found: Stable examination without evidence of new or progressive disease in the chest, abdomen or pelvis. Stable tiny bilateral pulmonary nodules. Stable macroscopic fat containing presacral soft tissue nodularity with minimal metabolism on prior PET-CT, similar to that of background blood pool on PET-CT favored to reflect a benign etiology such as extra medullary hematopoiesis. Unchanged size of the 3 cm infrarenal abdominal aortic aneurysm, suggest continued attention on follow-up oncologic imaging. Aortic Atherosclerosis and Emphysema.   Today, she states that she is doing well overall. Her appetite level is at 100%. Her energy level is at 80%. Traci Fuller states she is tired, though she just came home from a vacation to [place]  and [place]. She was driving 1-5 hours a day while traveling for 13 days. She notes lower left leg and ankle swelling. She has not worn compression socks, though she does have some.   She missed taking Magnesium  a few days while on vacation. Traci Fuller is  taking anastrozole  as prescribed and is tolerating it well with minimal side effects.   PAST MEDICAL HISTORY:   Past Medical History: 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 DES to midLAD, 75% stenosis of RCA not treated   Cancer [facility])    Current smoker    Essential hypertension    Family history of bladder cancer    Family history of breast cancer    Family history of leukemia    Family history of uterine cancer    Hyperlipidemia LDL goal <70    Myocardial infarction (HCC)    PONV (postoperative nausea and vomiting)    Port-A-Cath in place 10/29/2021    Surgical History: Past Surgical History:  Procedure Laterality Date   ABDOMINAL HYSTERECTOMY  BREAST BIOPSY Bilateral 10/16/2021   BREAST BIOPSY WITH RADIO FREQUENCY LOCALIZER Right 05/05/2022   Procedure: BREAST BIOPSY WITH RADIO FREQUENCY LOCALIZER;  Surgeon: Kallie Manuelita BROCKS, MD;  Location: [facility];  Service: General;  Laterality: Right;   CORONARY STENT INTERVENTION N/A 01/10/2018   Procedure: CORONARY STENT INTERVENTION;  Surgeon: Dann Candyce RAMAN, MD;  Location: [facility] INVASIVE CV LAB;  Service: Cardiovascular;  Laterality: N/A;   LEFT HEART CATH AND CORONARY ANGIOGRAPHY N/A 01/10/2018   Procedure: LEFT HEART CATH AND CORONARY ANGIOGRAPHY;  Surgeon: Dann Candyce RAMAN, MD;  Location: [facility] INVASIVE CV LAB;  Service: Cardiovascular;  Laterality: N/A;   MASTECTOMY MODIFIED RADICAL Left 05/05/2022   Procedure: MASTECTOMY MODIFIED RADICAL WITH RADIO FREQUENCY LOCALIZER;  Surgeon: Kallie Manuelita BROCKS, MD;  Location: [facility];  Service: General;  Laterality: Left;   PORTACATH PLACEMENT Right 10/26/2021   Procedure: INSERTION PORT-A-CATH;  Surgeon: Kallie Manuelita BROCKS, MD;  Location: [facility];  Service: General;  Laterality: Right;    Social History: Social History   Socioeconomic History   Marital status: Married    Spouse name: Not on file   Number of children: Not on file    Years of education: Not on file   Highest education level: Not on file  Occupational History   Not on file  Tobacco Use   Smoking status: Every Day    Current packs/day: 1.00    Average packs/day: 1 pack/day for 49.5 years (49.5 ttl pk-yrs)    Types: Cigarettes    Start date: 1976   Smokeless tobacco: Never  Vaping Use   Vaping status: Never Used  Substance and Sexual Activity   Alcohol use: Not Currently   Drug use: Never   Sexual activity: Yes  Other Topics Concern   Not on file  Social History Narrative   Not on file   Social Drivers of Health   Financial Resource Strain: Not on file  Food Insecurity: No Food Insecurity (12/23/2023)   Received from [facility]   Hunger Vital Sign    Within the past 12 months, you worried that your food would run out before you got the money to buy more.: Never true    Within the past 12 months, the food you bought just didn't last and you didn't have money to get more.: Never true  Transportation Needs: No Transportation Needs (12/23/2023)   Received from [facility] - Transportation    Lack of Transportation (Medical): No    Lack of Transportation (Non-Medical): No  Physical Activity: Not on file  Stress: Not on file  Social Connections: Unknown (12/06/2022)   Received from [facility]   Social Network    Social Network: Not on file  Intimate Partner Violence: Unknown (12/06/2022)   Received from [facility]   HITS    Physically Hurt: Not on file    Insult or Talk Down To: Not on file    Threaten Physical Harm: Not on file    Scream or Curse: Not on file    Family History: Family History  Problem Relation Age of Onset   Bladder Cancer Mother 13   Cervical cancer Mother        dx 24s, hysterectomy   Hypertension Father    Cirrhosis Father    Leukemia Brother 9   Liver cancer Brother 31   Breast cancer Maternal Aunt        dx 13s   Cancer Maternal Uncle  unk type d. 69   Cancer Paternal Aunt         unk type   Breast cancer Maternal Grandmother        dx 49s   Uterine cancer Maternal Grandmother     Current Medications:  Current Outpatient Medications:    ALPRAZolam  (XANAX ) 0.25 MG tablet, TAKE 1 TABLET(0.25 MG) BY MOUTH TWICE DAILY AS NEEDED FOR ANXIETY, Disp: 60 tablet, Rfl: 0   amLODipine  (NORVASC ) 10 MG tablet, Take 10 mg by mouth daily., Disp: , Rfl:    anastrozole  (ARIMIDEX ) 1 MG tablet, TAKE 1 TABLET(1 MG) BY MOUTH DAILY, Disp: 90 tablet, Rfl: 4   aspirin  EC 81 MG tablet, Take 1 tablet (81 mg total) by mouth daily., Disp: 90 tablet, Rfl: 3   Calcium  Carb-Cholecalciferol (CALCIUM  600/VITAMIN D3 PO), Take 1 tablet by mouth in the morning and at bedtime., Disp: , Rfl:    ELIQUIS  5 MG TABS tablet, TAKE 1 TABLET(5 MG) BY MOUTH TWICE DAILY, Disp: 60 tablet, Rfl: 5   gabapentin  (NEURONTIN ) 300 MG capsule, Take 300 mg by mouth 2 (two) times daily., Disp: , Rfl:    magnesium  oxide (MAG-OX) 400 (240 Mg) MG tablet, TAKE 2 TABLETS(800 MG) BY MOUTH TWICE DAILY, Disp: 120 tablet, Rfl: 3   metoprolol  tartrate (LOPRESSOR ) 25 MG tablet, Take 1 tablet (25 mg total) by mouth 2 (two) times daily. Please call and schedule overdue appointment with Dr. Court for further refills . SECOND ATTEMPT!, Disp: 30 tablet, Rfl: 0   omeprazole (PRILOSEC OTC) 20 MG tablet, Take 20 mg by mouth daily., Disp: , Rfl:    ondansetron  (ZOFRAN -ODT) 4 MG disintegrating tablet, Take 1 tablet (4 mg total) by mouth every 6 (six) hours as needed for nausea., Disp: 20 tablet, Rfl: 0   Pertuzumab  (PERJETA  IV), Inject into the vein every 21 ( twenty-one) days., Disp: , Rfl:    sulfamethoxazole -trimethoprim  (BACTRIM  DS) 800-160 MG tablet, Take 1 tablet by mouth 2 (two) times daily., Disp: 14 tablet, Rfl: 0   trastuzumab -anns (KANJINTI ) 150 MG SOLR injection, Inject 462 mg into the vein every 21 ( twenty-one) days., Disp: , Rfl:    Evolocumab  (REPATHA  SURECLICK) 140 MG/ML SOAJ, Inject 140 mg into the skin every 14 (fourteen)  days., Disp: 2 mL, Rfl: 11   mometasone (ELOCON) 0.1 % cream, Apply topically 2 (two) times daily., Disp: , Rfl:  No current facility-administered medications for this visit.  Facility-Administered Medications Ordered in Other Visits:    acetaminophen  (TYLENOL ) tablet 650 mg, 650 mg, Oral, Once, Rogers Hai, MD   cetirizine  (ZYRTEC ) tablet 10 mg, 10 mg, Oral, Once, Rogers Hai, MD   heparin  lock flush 100 unit/mL, 500 Units, Intracatheter, Once PRN, Alexie Samson, MD   Allergies: No Known Allergies  REVIEW OF SYSTEMS:   Review of Systems  Constitutional:  Negative for chills, fatigue and fever.  HENT:   Negative for lump/mass, mouth sores, nosebleeds, sore throat and trouble swallowing.   Eyes:  Negative for eye problems.  Respiratory:  Negative for cough and shortness of breath.   Cardiovascular:  Positive for leg swelling (land ankle swelling, left side). Negative for chest pain and palpitations.  Gastrointestinal:  Positive for diarrhea. Negative for abdominal pain, constipation, nausea and vomiting.  Genitourinary:  Negative for bladder incontinence, difficulty urinating, dysuria, frequency, hematuria and nocturia.   Musculoskeletal:  Negative for arthralgias, back pain, flank pain, myalgias and neck pain.  Skin:  Negative for itching and rash.  Neurological:  Positive for numbness (  in fingertips). Negative for dizziness and headaches.  Hematological:  Does not bruise/bleed easily.  Psychiatric/Behavioral:  Negative for depression, sleep disturbance and suicidal ideas. The patient is not nervous/anxious.   All other systems reviewed and are negative.    VITALS:   There were no vitals taken for this visit.  Wt Readings from Last 3 Encounters:  03/01/24 162 lb (73.5 kg)  02/09/24 159 lb 14.4 oz (72.5 kg)  01/19/24 160 lb 4.4 oz (72.7 kg)    There is no height or weight on file to calculate BMI.  Performance status (ECOG): 1 - Symptomatic but  completely ambulatory  PHYSICAL EXAM:   Physical Exam Vitals and nursing note reviewed. Exam conducted with a chaperone present.  Constitutional:      Appearance: Normal appearance.  Cardiovascular:     Rate and Rhythm: Normal rate and regular rhythm.     Pulses: Normal pulses.     Heart sounds: Normal heart sounds.  Pulmonary:     Effort: Pulmonary effort is normal.     Breath sounds: Normal breath sounds.  Abdominal:     Palpations: Abdomen is soft. There is no hepatomegaly, splenomegaly or mass.     Tenderness: There is no abdominal tenderness.  Musculoskeletal:     Right lower leg: No edema.     Left lower leg: No edema.  Lymphadenopathy:     Cervical: No cervical adenopathy.     Right cervical: No superficial, deep or posterior cervical adenopathy.    Left cervical: No superficial, deep or posterior cervical adenopathy.     Upper Body:     Right upper body: No supraclavicular or axillary adenopathy.     Left upper body: No supraclavicular or axillary adenopathy.  Neurological:     General: No focal deficit present.     Mental Status: She is alert and oriented to person, place, and time.  Psychiatric:        Mood and Affect: Mood normal.        Behavior: Behavior normal.     LABS:      Latest Ref Rng & Units 03/01/2024    9:59 AM 02/09/2024    8:09 AM 01/19/2024    7:59 AM  CBC  WBC 4.0 - 10.5 K/uL 6.0  5.9  5.8   Hemoglobin 12.0 - 15.0 g/dL 88.6  87.9  88.0   Hematocrit 36.0 - 46.0 % 35.7  36.6  37.2   Platelets 150 - 400 K/uL 182  201  211       Latest Ref Rng & Units 03/01/2024    9:59 AM 02/09/2024    8:09 AM 01/19/2024    7:59 AM  CMP  Glucose 70 - 99 mg/dL 875  891  91   BUN 8 - 23 mg/dL 32  29  27   Creatinine 0.44 - 1.00 mg/dL 8.34  8.40  8.66   Sodium 135 - 145 mmol/L 137  136  137   Potassium 3.5 - 5.1 mmol/L 4.1  4.1  4.3   Chloride 98 - 111 mmol/L 105  104  105   CO2 22 - 32 mmol/L 22  23  24    Calcium  8.9 - 10.3 mg/dL 9.9  89.8  9.9   Total  Protein 6.5 - 8.1 g/dL 6.8  6.9  7.0   Total Bilirubin 0.0 - 1.2 mg/dL 0.5  0.7  0.6   Alkaline Phos 38 - 126 U/L 103  101  124   AST 15 -  41 U/L 18  16  17    ALT 0 - 44 U/L 16  13  14       No results found for: CEA1, CEA / No results found for: CEA1, CEA No results found for: PSA1 No results found for: CAN199 No results found for: CAN125  No results found for: TOTALPROTELP, ALBUMINELP, A1GS, A2GS, BETS, BETA2SER, GAMS, MSPIKE, SPEI Lab Results  Component Value Date   TIBC 335 09/15/2023   FERRITIN 25 09/15/2023   IRONPCTSAT 16 09/15/2023   No results found for: LDH   STUDIES:   CT CHEST ABDOMEN PELVIS W CONTRAST Result Date: 02/16/2024 CLINICAL DATA:  History of breast cancer, follow-up. * Tracking Code: BO * EXAM: CT CHEST, ABDOMEN, AND PELVIS WITH CONTRAST TECHNIQUE: Multidetector CT imaging of the chest, abdomen and pelvis was performed following the standard protocol during bolus administration of intravenous contrast. RADIATION DOSE REDUCTION: This exam was performed according to the departmental dose-optimization program which includes automated exposure control, adjustment of the mA and/or kV according to patient size and/or use of iterative reconstruction technique. CONTRAST:  75mL OMNIPAQUE  IOHEXOL  300 MG/ML  SOLN COMPARISON:  Multiple priors including CT September 09, 2023 FINDINGS: CT CHEST FINDINGS Cardiovascular: Right chest Port-A-Cath with tip near the superior cavoatrial junction. Aortic atherosclerosis. Coronary artery calcifications. Mediastinum/Nodes: Similar stranding soft tissue about surgical clips in the left axilla. No pathologically enlarged mediastinal, hilar or axillary lymph nodes. Lungs/Pleura: Similar post radiation change in the anterior left upper lobe. Stable scattered tiny pulmonary nodules. No new suspicious pulmonary nodules or masses. For reference: -right lower lobe pulmonary nodule measures 5 mm on image 89/3, unchanged.  -subpleural left lower lobe pulmonary nodule measures 5 mm on image 101/3, unchanged. Emphysema. Musculoskeletal: Prior left mastectomy. No aggressive lytic or blastic lesion of bone. CT ABDOMEN PELVIS FINDINGS Hepatobiliary: No suspicious hepatic lesion. Gallbladder is unremarkable. No biliary ductal dilation. Pancreas: No pancreatic ductal dilation or evidence of acute inflammation. Spleen: No splenomegaly. Adrenals/Urinary Tract: Similar nodular thickening of the adrenal glands. No hydronephrosis. Kidneys demonstrate symmetric enhancement. Urinary bladder is unremarkable for degree of distension. Stomach/Bowel: Stomach is nondistended limiting evaluation. No pathologic dilation of small or large bowel. No evidence of acute bowel inflammation. Similar submucosal fibrofatty infiltration of the distal sigmoid colon through the rectum. Vascular/Lymphatic: Aortic and branch vessel atherosclerosis. Infrarenal abdominal aortic aneurysm measures 3 cm on sagittal image 116/3, unchanged No pathologically enlarged abdominal or pelvic lymph nodes. Reproductive: Status post hysterectomy. No adnexal masses. Other: Similar macroscopic fat containing presacral soft tissue nodularity measuring 4.5 x 2.4 cm on image 94/2 previously 4.5 x 2.4 cm when remeasured for consistency present dating back to at least October 22, 2021 with minimal metabolism, similar to that of background blood pool on PET-CT favored to reflect a benign etiology such as extra medullary hematopoiesis. Musculoskeletal: No aggressive lytic or blastic lesion of bone. Multilevel degenerative change of the spine. IMPRESSION: 1. Stable examination without evidence of new or progressive disease in the chest, abdomen or pelvis. 2. Stable tiny bilateral pulmonary nodules. 3. Stable macroscopic fat containing presacral soft tissue nodularity with minimal metabolism on prior PET-CT, similar to that of background blood pool on PET-CT favored to reflect a benign etiology  such as extra medullary hematopoiesis. 4. Unchanged size of the 3 cm infrarenal abdominal aortic aneurysm, suggest continued attention on follow-up oncologic imaging. 5. Aortic Atherosclerosis (ICD10-I70.0) and Emphysema (ICD10-J43.9). Electronically Signed   By: Reyes Holder M.D.   On: 02/16/2024 14:25

## 2024-03-01 NOTE — Patient Instructions (Addendum)
 [facility] at [facility] Discharge Instructions   You were seen and examined today by Dr. Rogers.  He reviewed the results of your lab work which are normal/stable.   He reviewed the results of your CT scan which does not show any new evidence of cancer growing or spreading.   We will proceed with your treatment today.   Return as scheduled.    Thank you for choosing [facility] at [facility] to provide your oncology and hematology care.  To afford each patient quality time with our provider, please arrive at least 15 minutes before your scheduled appointment time.   If you have a lab appointment with the Cancer Center please come in thru the Main Entrance and check in at the main information desk.  You need to re-schedule your appointment should you arrive 10 or more minutes late.  We strive to give you quality time with our providers, and arriving late affects you and other patients whose appointments are after yours.  Also, if you no show three or more times for appointments you may be dismissed from the clinic at the providers discretion.     Again, thank you for choosing [facility].  Our hope is that these requests will decrease the amount of time that you wait before being seen by our physicians.       _____________________________________________________________  Should you have questions after your visit to [facility], please contact our office at [phone] and follow the prompts.  Our office hours are 8:00 a.m. and 4:30 p.m. Monday - Friday.  Please note that voicemails left after 4:00 p.m. may not be returned until the following business day.  We are closed weekends and major holidays.  You do have access to a nurse 24-7, just call the main number to the clinic [phone] and do not press any options, hold on the line and a nurse will answer the phone.    For prescription refill requests, have  your pharmacy contact our office and allow 72 hours.    Due to Covid, you will need to wear a mask upon entering the hospital. If you do not have a mask, a mask will be given to you at the Main Entrance upon arrival. For doctor visits, patients may have 1 support person age 43 or older with them. For treatment visits, patients can not have anyone with them due to social distancing guidelines and our immunocompromised population.

## 2024-03-01 NOTE — Progress Notes (Signed)
 Per pt she took her premedications prior to appointment. Mag 1.4, pt will receive Mag 4g IVPB per Dr Rogers standing orders.   Patient tolerated chemotherapy with no complaints voiced.  Side effects with management reviewed with understanding verbalized.  Port site clean and dry with no bruising or swelling noted at site.  Good blood return noted before and after administration of chemotherapy.  Band aid applied.  Patient left in satisfactory condition with VSS and no s/s of distress noted. All follow ups as scheduled.   Traci Fuller

## 2024-03-13 ENCOUNTER — Ambulatory Visit (HOSPITAL_COMMUNITY)
Admission: RE | Admit: 2024-03-13 | Discharge: 2024-03-13 | Disposition: A | Discharge status: Home / Self Care | Source: Ambulatory Visit | Attending: Hematology | Admitting: Hematology

## 2024-03-13 ENCOUNTER — Ambulatory Visit (HOSPITAL_COMMUNITY)
Admission: RE | Admit: 2024-03-13 | Discharge: 2024-03-13 | Discharge status: Home / Self Care | Source: Ambulatory Visit | Attending: Hematology | Admitting: Hematology

## 2024-03-13 DIAGNOSIS — Z79899 Other long term (current) drug therapy: Secondary | ICD-10-CM | POA: Insufficient documentation

## 2024-03-13 DIAGNOSIS — M79605 Pain in left leg: Secondary | ICD-10-CM | POA: Diagnosis present

## 2024-03-13 DIAGNOSIS — M7989 Other specified soft tissue disorders: Secondary | ICD-10-CM | POA: Diagnosis present

## 2024-03-13 LAB — ECHOCARDIOGRAM COMPLETE
Area-P 1/2: 3.27 cm2
S' Lateral: 2.7 cm

## 2024-03-13 NOTE — Progress Notes (Signed)
*  PRELIMINARY RESULTS* Echocardiogram 2D Echocardiogram has been performed.  Traci Fuller 03/13/2024, 9:17 AM

## 2024-03-15 ENCOUNTER — Encounter (HOSPITAL_COMMUNITY)
Admission: RE | Admit: 2024-03-15 | Discharge: 2024-03-15 | Disposition: A | Discharge status: Home / Self Care | Source: Ambulatory Visit | Attending: Hematology | Admitting: Hematology

## 2024-03-15 DIAGNOSIS — C7802 Secondary malignant neoplasm of left lung: Secondary | ICD-10-CM | POA: Insufficient documentation

## 2024-03-15 DIAGNOSIS — C50912 Malignant neoplasm of unspecified site of left female breast: Secondary | ICD-10-CM | POA: Insufficient documentation

## 2024-03-15 MED ORDER — FLUDEOXYGLUCOSE F - 18 (FDG) INJECTION
8.3900 | Freq: Once | INTRAVENOUS | Status: AC | PRN
  Administered 2024-03-15: 8.39 via INTRAVENOUS

## 2024-03-22 ENCOUNTER — Inpatient Hospital Stay

## 2024-03-22 ENCOUNTER — Encounter: Payer: Self-pay | Admitting: Hematology

## 2024-03-22 ENCOUNTER — Inpatient Hospital Stay: Admitting: Hematology

## 2024-03-22 VITALS — BP 130/73 | HR 58 | Temp 98.1°F | Resp 20 | Wt 161.8 lb

## 2024-03-22 VITALS — BP 128/78 | HR 58 | Temp 97.2°F | Resp 18

## 2024-03-22 DIAGNOSIS — Z170 Estrogen receptor positive status [ER+]: Secondary | ICD-10-CM

## 2024-03-22 DIAGNOSIS — Z95828 Presence of other vascular implants and grafts: Secondary | ICD-10-CM

## 2024-03-22 DIAGNOSIS — C50812 Malignant neoplasm of overlapping sites of left female breast: Secondary | ICD-10-CM | POA: Diagnosis not present

## 2024-03-22 DIAGNOSIS — Z79899 Other long term (current) drug therapy: Secondary | ICD-10-CM | POA: Diagnosis not present

## 2024-03-22 DIAGNOSIS — Z5112 Encounter for antineoplastic immunotherapy: Secondary | ICD-10-CM | POA: Diagnosis not present

## 2024-03-22 LAB — COMPREHENSIVE METABOLIC PANEL WITH GFR
ALT: 16 U/L (ref 0–44)
AST: 17 U/L (ref 15–41)
Albumin: 3.7 g/dL (ref 3.5–5.0)
Alkaline Phosphatase: 114 U/L (ref 38–126)
Anion gap: 10 (ref 5–15)
BUN: 28 mg/dL — ABNORMAL HIGH (ref 8–23)
CO2: 24 mmol/L (ref 22–32)
Calcium: 9.9 mg/dL (ref 8.9–10.3)
Chloride: 105 mmol/L (ref 98–111)
Creatinine, Ser: 1.63 mg/dL — ABNORMAL HIGH (ref 0.44–1.00)
GFR, Estimated: 35 mL/min — ABNORMAL LOW (ref >60)
Glucose, Bld: 129 mg/dL — ABNORMAL HIGH (ref 70–99)
Potassium: 4.1 mmol/L (ref 3.5–5.1)
Sodium: 139 mmol/L (ref 135–145)
Total Bilirubin: 0.8 mg/dL (ref 0.0–1.2)
Total Protein: 6.9 g/dL (ref 6.5–8.1)

## 2024-03-22 LAB — CBC WITH DIFFERENTIAL/PLATELET
Abs Immature Granulocytes: 0.04 K/uL (ref 0.00–0.07)
Basophils Absolute: 0 K/uL (ref 0.0–0.1)
Basophils Relative: 1 %
Eosinophils Absolute: 0.3 K/uL (ref 0.0–0.5)
Eosinophils Relative: 5 %
HCT: 35.2 % — ABNORMAL LOW (ref 36.0–46.0)
Hemoglobin: 11.5 g/dL — ABNORMAL LOW (ref 12.0–15.0)
Immature Granulocytes: 1 %
Lymphocytes Relative: 16 %
Lymphs Abs: 0.9 K/uL (ref 0.7–4.0)
MCH: 31.8 pg (ref 26.0–34.0)
MCHC: 32.7 g/dL (ref 30.0–36.0)
MCV: 97.2 fL (ref 80.0–100.0)
Monocytes Absolute: 0.4 K/uL (ref 0.1–1.0)
Monocytes Relative: 7 %
Neutro Abs: 4 K/uL (ref 1.7–7.7)
Neutrophils Relative %: 70 %
Platelets: 205 K/uL (ref 150–400)
RBC: 3.62 MIL/uL — ABNORMAL LOW (ref 3.87–5.11)
RDW: 14.8 % (ref 11.5–15.5)
WBC: 5.6 K/uL (ref 4.0–10.5)
nRBC: 0 % (ref 0.0–0.2)

## 2024-03-22 LAB — MAGNESIUM: Magnesium: 1.3 mg/dL — ABNORMAL LOW (ref 1.7–2.4)

## 2024-03-22 MED ORDER — SODIUM CHLORIDE 0.9% FLUSH
10.0000 mL | INTRAVENOUS | Status: DC | PRN
  Administered 2024-03-22: 10 mL via INTRAVENOUS

## 2024-03-22 MED ORDER — SODIUM CHLORIDE 0.9% FLUSH
10.0000 mL | INTRAVENOUS | Status: DC | PRN
  Administered 2024-03-22: 10 mL

## 2024-03-22 MED ORDER — SODIUM CHLORIDE 0.9 % IV SOLN
Freq: Once | INTRAVENOUS | Status: AC
Start: 2024-03-22 — End: 2024-03-22

## 2024-03-22 MED ORDER — SODIUM CHLORIDE 0.9 % IV SOLN
420.0000 mg | Freq: Once | INTRAVENOUS | Status: AC
  Administered 2024-03-22: 420 mg via INTRAVENOUS
  Filled 2024-03-22: qty 14

## 2024-03-22 MED ORDER — TRASTUZUMAB-ANNS CHEMO 150 MG IV SOLR
6.0000 mg/kg | Freq: Once | INTRAVENOUS | Status: AC
  Administered 2024-03-22: 420 mg via INTRAVENOUS
  Filled 2024-03-22: qty 20

## 2024-03-22 MED ORDER — MAGNESIUM SULFATE 4 GM/100ML IV SOLN
4.0000 g | Freq: Once | INTRAVENOUS | Status: AC
  Administered 2024-03-22: 4 g via INTRAVENOUS
  Filled 2024-03-22: qty 100

## 2024-03-22 MED ORDER — HEPARIN SOD (PORK) LOCK FLUSH 100 UNIT/ML IV SOLN
500.0000 [IU] | Freq: Once | INTRAVENOUS | Status: AC | PRN
Start: 2024-03-22 — End: 2024-03-22
  Administered 2024-03-22: 500 [IU]

## 2024-03-22 NOTE — Progress Notes (Signed)
 Patient has been examined by Dr. Ellin Saba. Vital signs and labs have been reviewed by MD - ANC, Creatinine, LFTs, hemoglobin, and platelets are within treatment parameters per M.D. - pt may proceed with treatment.  Primary RN and pharmacy notified.

## 2024-03-22 NOTE — Progress Notes (Signed)
 Patients port flushed without difficulty.  Good blood return noted with no bruising or swelling noted at site.  Patient remains accessed for treatment.

## 2024-03-22 NOTE — Progress Notes (Signed)
 [facility] [address] [place] [address]    Clinic Day:  03/22/24   Referring physician: Shona Norleen PEDLAR, MD  Patient Care Team: Shona Norleen PEDLAR, MD as PCP - General (Internal Medicine) Court Dorn PARAS, MD as PCP - Cardiology (Cardiology) Rogers Hai, MD as Medical Oncologist (Medical Oncology) Celestia Joesph SQUIBB, RN as Oncology Nurse Navigator (Medical Oncology)   ASSESSMENT & PLAN:   Assessment: 1. 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 - She noticed lump has gotten bigger and deformed since September 2022.  She has itching over the skin of the breast. - Left breast 4:00 biopsy: Invasive mammary carcinoma, grade 2, ER 100%, PR 70%, Ki-67 25%, HER2 FISH positive - Left breast 2:00 biopsy: Invasive ductal carcinoma, grade 2, ER 100%, PR 70%, Ki-67 25%, HER2 negative - Left axillary lymph node biopsy: IDC, ER 100%, PR 50%, HER2 negative, E-cadherin positive - Bilateral breast MRI on 10/09/2021: Multiple enhancing masses with intervening non-mass enhancement involving most of the LOQ and part of the UOQ.  Abnormal enhancement spans approximately 12.4 x 6.4 x 7 cm.  Diffuse skin thickening/edema.  Multiple left axillary lymph nodes, largest node measuring approximately 2.4 cm in the lower axilla.  Indeterminate non-mass enhancement inferior to the biopsy site in the subareolar right breast measuring 1.5 x 0.9 x 0.8 cm. - Right breast biopsy UOQ on 10/01/2021: Fibroadenomatoid nodule with calcifications, fibrocystic changes including apocrine metaplasia with no malignancy. -PET scan from 10/22/2021: Large left breast mass and numerous hypermetabolic left axillary and subpectoral lymph nodes.  2 hypermetabolic left upper lobe lung nodules measuring 7 mm with SUV 4.2 and 3.2 respectively.  No other hypermetabolic lung nodules.  No distant metastatic disease.  Nonspecific low-level subareolar activity in the right  breast near the surgical clips and a single mildly hypermetabolic right axillary lymph node. - Echocardiogram shows EF 60 to 65%. - She has oligometastatic disease with 2 small lung nodules.  Biopsy is not feasible.  I have recommended TCHP regimen followed by restaging after 3 cycles. -6 cycles of dose reduced TCHP from 11/04/2021 through 03/11/2022 - Maintenance Herceptin  and pertuzumab  started on 04/01/2022, anastrozole  started on 04/01/2022 -Left mastectomy and lymph node dissection (05/05/2022): [id number], residual IDC, grade 2, discontinuously involving fibrotic area measuring 5.9 cm, margins negative, LVI positive, response to therapy seen including hyalin fibrosis and dystrophic calcifications.  9/18 lymph nodes positive, largest focus of metastatic carcinoma measures 1.2 cm with extranodal extension.  Right breast lumpectomy shows atypical lobular hyperplasia. - We discussed switching to Kadcyla in the adjuvant setting.  Related to efficacy of Kadcyla compared with pertuzumab  and trastuzumab  in the adjuvant setting is unknown.  Patient is reluctant to consider switching as she had poor tolerance with the prior chemotherapy.  Hence we have continued Herceptin  and Perjeta . - XRT to the chest wall from 09/13/2022   2. Social/family history: - She lives at home with her husband and daughter.  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  Current active smoker, half pack per day since age 3. - Mother had cervical cancer and bladder cancer.  Maternal grandmother had ovarian cancer.  Brother died at age 99 from acute leukemia.  Another brother died of liver cancer.  Maternal half aunt had breast cancer.    Plan: 1. Oligometastatic (two 7 mm lung lesions) HER2 positive left breast cancer: - She is tolerating Herceptin  and pertuzumab  very well.  She is also tolerating anastrozole  very well. - Reviewed labs today: Normal LFTs.  CBC was normal. - Reviewed PET scan from 03/15/2024 images with  the patient: No evidence of recurrence or metastatic disease.  Stable right lower lobe and left lower lobe lung nodules, not PET avid.  No new lesions. - We had prolonged discussion about possibly discontinuing Herceptin  and pertuzumab  with close monitoring of the lung lesions.  If they grow, may consider SBRT.  She is agreeable.  She will receive her last dose today.  I would schedule CT CAP with contrast in 4 months along with tumor markers.   2.  Right facial shingles/postherpetic right hemifacial pain: - Continue gabapentin  300 mg twice daily.  3.  Hypomagnesemia: - Continue magnesium  2 tablets twice daily.  Magnesium  is low at 1.3.  Hopefully this will get better after we discontinue Herceptin  and pertuzumab .  4.  High risk drug monitoring: - She does not have any signs of PND or orthopnea.  We reviewed echocardiogram from 03/13/2024: LVEF 65-70%.  5.  Normocytic anemia: - Last Venofer  was on 09/22/2023.  Hemoglobin stable at 11.5.  6.  Left leg DVT (diagnosed 03/04/2022): - She had swelling of the left leg after recent road trip.  I reviewed left lower extremity Doppler which did not show any evidence of DVT. - Continue Eliquis  twice daily.  No bleeding issues reported.  7.  Osteopenia (DEXA 04/19/2022 T score -2.1): - Continue calcium  and vitamin D supplements.  Will repeat DEXA scan prior to next visit.    Orders Placed This Encounter  Procedures   CT CHEST ABDOMEN PELVIS W CONTRAST    Standing Status:   Future    Expected Date:   06/22/2024    Expiration Date:   03/22/2025    If indicated for the ordered procedure, I authorize the administration of contrast media per Radiology protocol:   Yes    Does the patient have a contrast media/X-ray dye allergy?:   No    If indicated for the ordered procedure, I authorize the administration of oral contrast media per Radiology protocol:   Yes    Preferred imaging location?:   [facility]   DG Bone Density    Standing Status:    Future    Expected Date:   06/22/2024    Expiration Date:   03/22/2025    Reason for Exam (SYMPTOM  OR DIAGNOSIS REQUIRED):   anti estrogen therapy    Preferred imaging location?:   [facility]   CBC with Differential    Standing Status:   Future    Expected Date:   06/19/2024    Expiration Date:   09/17/2024   Comprehensive metabolic panel    Standing Status:   Future    Expected Date:   06/19/2024    Expiration Date:   09/17/2024   Cancer antigen 15-3    Standing Status:   Future    Expected Date:   06/19/2024    Expiration Date:   09/17/2024   Cancer antigen 27.29    Standing Status:   Future    Expected Date:   06/19/2024    Expiration Date:   09/17/2024      LILLETTE Hummingbird R Teague,acting as a scribe for Alean Stands, MD.,have documented all relevant documentation on the behalf of Alean Stands, MD,as directed by  Alean Stands, MD while in the presence of Alean Stands, MD.  I, Alean Stands MD, have reviewed the above documentation for accuracy  and completeness, and I agree with the above.      Alean Stands, MD   7/31/20251:25 PM  CHIEF COMPLAINT:   Diagnosis: locally advanced left breast cancer    Cancer Staging  Breast cancer, left [facility]) Staging form: Breast, AJCC 8th Edition - Clinical stage from 10/15/2021: Stage IIA (cT3, cN1, cM0, G2, ER+, PR+, HER2+) - Unsigned    Prior Therapy: TCHP x 6 cycles   Current Therapy:  Anastrozole  daily, trastuzumab  and Herceptin  every 21 days    HISTORY OF PRESENT ILLNESS:   Oncology History  Breast cancer, left (HCC)  10/15/2021 Initial Diagnosis   Breast cancer, left (HCC)   11/04/2021 - 04/22/2022 Chemotherapy   Patient is on Treatment Plan : BREAST  Docetaxel  + Carboplatin  + Trastuzumab  + Pertuzumab   (TCHP) q21d       Genetic Testing   Negative genetic testing. No pathogenic variants identified on the [facility] Multi-Cancer+RNA panel. The report date is 11/02/2021.   The  Multi-Cancer Panel + RNA offered by [facility] includes sequencing and/or deletion duplication testing of the following 84 genes: AIP, ALK, APC, ATM, AXIN2,BAP1,  BARD1, BLM, BMPR1A, BRCA1, BRCA2, BRIP1, CASR, CDC73, CDH1, CDK4, CDKN1B, CDKN1C, CDKN2A (p14ARF), CDKN2A (p16INK4a), CEBPA, CHEK2, CTNNA1, DICER1, DIS3L2, EGFR (c.2369C>T, p.Thr790Met variant only), EPCAM (Deletion/duplication testing only), FH, FLCN, GATA2, GPC3, GREM1 (Promoter region deletion/duplication testing only), HOXB13 (c.251G>A, p.Gly84Glu), HRAS, KIT, MAX, MEN1, MET, MITF (c.952G>A, p.Glu318Lys variant only), MLH1, MSH2, MSH3, MSH6, MUTYH, NBN, NF1, NF2, NTHL1, PALB2, PDGFRA, PHOX2B, PMS2, POLD1, POLE, POT1, PRKAR1A, PTCH1, PTEN, RAD50, RAD51C, RAD51D, RB1, RECQL4, RET, RUNX1, SDHAF2, SDHA (sequence changes only), SDHB, SDHC, SDHD, SMAD4, SMARCA4, SMARCB1, SMARCE1, STK11, SUFU, TERC, TERT, TMEM127, TP53, TSC1, TSC2, VHL, WRN and WT1.   11/04/2021 -  Chemotherapy   Patient is on Treatment Plan : BREAST  Docetaxel  + Carboplatin  + Trastuzumab  + Pertuzumab   (TCHP) q21d / Trastuzumab  + Pertuzumab  q21d        INTERVAL HISTORY:   [place] is a 68 y.o. female presenting to clinic today for follow up of locally advanced left breast cancer. She was last seen by me on 12/08/23.  Since her last visit, she underwent restaging PET on 03/15/2024 that found: No evidence of recurrent or metastatic disease. 3.2 cm infrarenal aortic aneurysm. 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 Aortic atherosclerosis. Coronary artery calcification. Emphysema.   Today, she states that she is doing well overall. Her appetite level is at 100%. Her energy level is at 75%. Kaely is accompanied by her husband.   Her left leg swelling has improved. She states it is still noticeable when she is on her feet for long periods of time. Feiga believes symptoms are somewhat attributable to  neuropathy, as she notes pain and numbness in the feet. She is taking 2 tables of magnesium  BID and Gabapentin  300 mg BID.  Hendy is taking anastrozole  as prescribed and is agreeable to discontinue herceptin  and trastuzumab .   PAST MEDICAL HISTORY:   Past Medical History: 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 DES to midLAD, 75% stenosis of RCA not treated   Cancer [facility])    Current smoker    Essential hypertension    Family history of bladder cancer    Family history of breast cancer    Family history of leukemia    Family history of uterine cancer  Hyperlipidemia LDL goal <70    Myocardial infarction (HCC)    PONV (postoperative nausea and vomiting)    Port-A-Cath in place 10/29/2021    Surgical History: Past Surgical History:  Procedure Laterality Date   ABDOMINAL HYSTERECTOMY     BREAST BIOPSY Bilateral 10/16/2021   BREAST BIOPSY WITH RADIO FREQUENCY LOCALIZER Right 05/05/2022   Procedure: BREAST BIOPSY WITH RADIO FREQUENCY LOCALIZER;  Surgeon: Kallie Manuelita BROCKS, MD;  Location: [facility] ORS;  Service: General;  Laterality: Right;   CORONARY STENT INTERVENTION N/A 01/10/2018   Procedure: CORONARY STENT INTERVENTION;  Surgeon: Dann Candyce RAMAN, MD;  Location: [facility] INVASIVE CV LAB;  Service: Cardiovascular;  Laterality: N/A;   LEFT HEART CATH AND CORONARY ANGIOGRAPHY N/A 01/10/2018   Procedure: LEFT HEART CATH AND CORONARY ANGIOGRAPHY;  Surgeon: Dann Candyce RAMAN, MD;  Location: [facility] INVASIVE CV LAB;  Service: Cardiovascular;  Laterality: N/A;   MASTECTOMY MODIFIED RADICAL Left 05/05/2022   Procedure: MASTECTOMY MODIFIED RADICAL WITH RADIO FREQUENCY LOCALIZER;  Surgeon: Kallie Manuelita BROCKS, MD;  Location: [facility] ORS;  Service: General;  Laterality: Left;   PORTACATH PLACEMENT Right 10/26/2021   Procedure: INSERTION PORT-A-CATH;  Surgeon: Kallie Manuelita BROCKS, MD;  Location: [facility];  Service: General;  Laterality: Right;    Social  History: Social History   Socioeconomic History   Marital status: Married    Spouse name: Not on file   Number of children: Not on file   Years of education: Not on file   Highest education level: Not on file  Occupational History   Not on file  Tobacco Use   Smoking status: Every Day    Current packs/day: 1.00    Average packs/day: 1 pack/day for 49.6 years (49.6 ttl pk-yrs)    Types: Cigarettes    Start date: 1976   Smokeless tobacco: Never  Vaping Use   Vaping status: Never Used  Substance and Sexual Activity   Alcohol use: Not Currently   Drug use: Never   Sexual activity: Yes  Other Topics Concern   Not on file  Social History Narrative   Not on file   Social Drivers of Health   Financial Resource Strain: Not on file  Food Insecurity: No Food Insecurity (12/23/2023)   [facility] from [facility]   Hunger Vital Sign    Within the past 12 months, you worried that your food would run out before you got the money to buy more.: Never true    Within the past 12 months, the food you bought just didn't last and you didn't have money to get more.: Never true  Transportation Needs: No Transportation Needs (12/23/2023)   Received from [facility] - Transportation    Lack of Transportation (Medical): No    Lack of Transportation (Non-Medical): No  Physical Activity: Not on file  Stress: Not on file  Social Connections: Unknown (12/06/2022)   [facility]   Social Network    Social Network: Not on file  Intimate Partner Violence: Unknown (12/06/2022)   Received from [facility]   HITS    Physically Hurt: Not on file    Insult or Talk Down To: Not on file    Threaten Physical Harm: Not on file    Scream or Curse: Not on file    Family History: Family History  Problem Relation Age of Onset   Bladder Cancer Mother 64   Cervical cancer Mother        dx 57s, hysterectomy  Hypertension Father    Cirrhosis Father    Leukemia Brother 46    Liver cancer Brother 16   Breast cancer Maternal Aunt        dx 80s   Cancer Maternal Uncle        unk type d. 2   Cancer Paternal Aunt        unk type   Breast cancer Maternal Grandmother        dx 91s   Uterine cancer Maternal Grandmother     Current Medications:  Current Outpatient Medications:    ALPRAZolam  (XANAX ) 0.25 MG tablet, TAKE 1 TABLET(0.25 MG) BY MOUTH TWICE DAILY AS NEEDED FOR ANXIETY, Disp: 60 tablet, Rfl: 0   amLODipine  (NORVASC ) 10 MG tablet, Take 10 mg by mouth daily., Disp: , Rfl:    anastrozole  (ARIMIDEX ) 1 MG tablet, TAKE 1 TABLET(1 MG) BY MOUTH DAILY, Disp: 90 tablet, Rfl: 4   aspirin  EC 81 MG tablet, Take 1 tablet (81 mg total) by mouth daily., Disp: 90 tablet, Rfl: 3   Calcium  Carb-Cholecalciferol (CALCIUM  600/VITAMIN D3 PO), Take 1 tablet by mouth in the morning and at bedtime., Disp: , Rfl:    ELIQUIS  5 MG TABS tablet, TAKE 1 TABLET(5 MG) BY MOUTH TWICE DAILY, Disp: 60 tablet, Rfl: 5   gabapentin  (NEURONTIN ) 300 MG capsule, Take 300 mg by mouth 2 (two) times daily., Disp: , Rfl:    magnesium  oxide (MAG-OX) 400 (240 Mg) MG tablet, TAKE 2 TABLETS(800 MG) BY MOUTH TWICE DAILY, Disp: 120 tablet, Rfl: 3   metoprolol  tartrate (LOPRESSOR ) 25 MG tablet, Take 1 tablet (25 mg total) by mouth 2 (two) times daily. Please call and schedule overdue appointment with Dr. Court for further refills . SECOND ATTEMPT!, Disp: 30 tablet, Rfl: 0   omeprazole (PRILOSEC OTC) 20 MG tablet, Take 20 mg by mouth daily., Disp: , Rfl:    ondansetron  (ZOFRAN -ODT) 4 MG disintegrating tablet, Take 1 tablet (4 mg total) by mouth every 6 (six) hours as needed for nausea., Disp: 20 tablet, Rfl: 0   Pertuzumab  (PERJETA  IV), Inject into the vein every 21 ( twenty-one) days., Disp: , Rfl:    sulfamethoxazole -trimethoprim  (BACTRIM  DS) 800-160 MG tablet, Take 1 tablet by mouth 2 (two) times daily., Disp: 14 tablet, Rfl: 0   trastuzumab -anns (KANJINTI ) 150 MG SOLR injection, Inject 462 mg into the vein  every 21 ( twenty-one) days., Disp: , Rfl:  No current facility-administered medications for this visit.  Facility-Administered Medications Ordered in Other Visits:    heparin  lock flush 100 unit/mL, 500 Units, Intracatheter, Once PRN, Jaelah Hauth, MD   magnesium  sulfate IVPB 4 g 100 mL, 4 g, Intravenous, Once, Rogers Hai, MD, Last Rate: 50 mL/hr at 03/22/24 1146, 4 g at 03/22/24 1146   pertuzumab  (PERJETA ) 420 mg in sodium chloride  0.9 % 250 mL chemo infusion, 420 mg, Intravenous, Once, Rogers Hai, MD   sodium chloride  flush (NS) 0.9 % injection 10 mL, 10 mL, Intracatheter, PRN, Yorley Buch, MD   trastuzumab -anns (KANJINTI ) 420 mg in sodium chloride  0.9 % 250 mL chemo infusion, 6 mg/kg (Order-Specific), Intravenous, Once, Luccas Towell, MD   Allergies: No Known Allergies  REVIEW OF SYSTEMS:   Review of Systems  Constitutional:  Negative for chills, fatigue and fever.  HENT:   Negative for lump/mass, mouth sores, nosebleeds, sore throat and trouble swallowing.   Eyes:  Negative for eye problems.  Respiratory:  Negative for cough and shortness of breath.   Cardiovascular:  Negative for chest  pain, leg swelling and palpitations.  Gastrointestinal:  Negative for abdominal pain, constipation, diarrhea, nausea and vomiting.  Genitourinary:  Negative for bladder incontinence, difficulty urinating, dysuria, frequency, hematuria and nocturia.   Musculoskeletal:  Negative for arthralgias, back pain, flank pain, myalgias and neck pain.  Skin:  Negative for itching and rash.  Neurological:  Positive for dizziness and numbness (in fingertips and feet). Negative for headaches.  Hematological:  Does not bruise/bleed easily.  Psychiatric/Behavioral:  Positive for sleep disturbance. Negative for depression and suicidal ideas. The patient is not nervous/anxious.   All other systems reviewed and are negative.    VITALS:   There were no vitals taken for this  visit.  Wt Readings from Last 3 Encounters:  03/22/24 161 lb 13.1 oz (73.4 kg)  03/01/24 162 lb (73.5 kg)  02/09/24 159 lb 14.4 oz (72.5 kg)    There is no height or weight on file to calculate BMI.  Performance status (ECOG): 1 - Symptomatic but completely ambulatory  PHYSICAL EXAM:   Physical Exam Vitals and nursing note reviewed. Exam conducted with a chaperone present.  Constitutional:      Appearance: Normal appearance.  Cardiovascular:     Rate and Rhythm: Normal rate and regular rhythm.     Pulses: Normal pulses.     Heart sounds: Normal heart sounds.  Pulmonary:     Effort: Pulmonary effort is normal.     Breath sounds: Normal breath sounds.  Abdominal:     Palpations: Abdomen is soft. There is no hepatomegaly, splenomegaly or mass.     Tenderness: There is no abdominal tenderness.  Musculoskeletal:     Right lower leg: No edema.     Left lower leg: No edema.  Lymphadenopathy:     Cervical: No cervical adenopathy.     Right cervical: No superficial, deep or posterior cervical adenopathy.    Left cervical: No superficial, deep or posterior cervical adenopathy.     Upper Body:     Right upper body: No supraclavicular or axillary adenopathy.     Left upper body: No supraclavicular or axillary adenopathy.  Neurological:     General: No focal deficit present.     Mental Status: She is alert and oriented to person, place, and time.  Psychiatric:        Mood and Affect: Mood normal.        Behavior: Behavior normal.     LABS:      Latest Ref Rng & Units 03/22/2024    9:45 AM 03/01/2024    9:59 AM 02/09/2024    8:09 AM  CBC  WBC 4.0 - 10.5 K/uL 5.6  6.0  5.9   Hemoglobin 12.0 - 15.0 g/dL 88.4  88.6  87.9   Hematocrit 36.0 - 46.0 % 35.2  35.7  36.6   Platelets 150 - 400 K/uL 205  182  201       Latest Ref Rng & Units 03/22/2024    9:45 AM 03/01/2024    9:59 AM 02/09/2024    8:09 AM  CMP  Glucose 70 - 99 mg/dL 870  875  891   BUN 8 - 23 mg/dL 28  32  29    Creatinine 0.44 - 1.00 mg/dL 8.36  8.34  8.40   Sodium 135 - 145 mmol/L 139  137  136   Potassium 3.5 - 5.1 mmol/L 4.1  4.1  4.1   Chloride 98 - 111 mmol/L 105  105  104   CO2  22 - 32 mmol/L 24  22  23    Calcium  8.9 - 10.3 mg/dL 9.9  9.9  89.8   Total Protein 6.5 - 8.1 g/dL 6.9  6.8  6.9   Total Bilirubin 0.0 - 1.2 mg/dL 0.8  0.5  0.7   Alkaline Phos 38 - 126 U/L 114  103  101   AST 15 - 41 U/L 17  18  16    ALT 0 - 44 U/L 16  16  13       No results found for: CEA1, CEA / No results found for: CEA1, CEA No results found for: PSA1 No results found for: CAN199 No results found for: CAN125  No results found for: TOTALPROTELP, ALBUMINELP, A1GS, A2GS, BETS, BETA2SER, GAMS, MSPIKE, SPEI Lab Results  Component Value Date   TIBC 335 09/15/2023   FERRITIN 25 09/15/2023   IRONPCTSAT 16 09/15/2023   No results found for: LDH   STUDIES:   NM PET Image Restag (PS) Skull Base To Thigh Result Date: 03/16/2024 CLINICAL DATA:  Subsequent treatment strategy for lung cancer, breast cancer. EXAM: NUCLEAR MEDICINE PET SKULL BASE TO THIGH TECHNIQUE: 8.4 mCi F-18 FDG was injected intravenously. Full-ring PET imaging was performed from the skull base to thigh after the radiotracer. CT data was obtained and used for attenuation correction and anatomic localization. Fasting blood glucose: 123 mg/dl COMPARISON:  CT chest abdomen pelvis 02/16/2024 and PET 10/22/2021. FINDINGS: Mediastinal blood pool activity: SUV max 3.3 Liver activity: SUV max NA NECK: No abnormal hypermetabolism. Incidental CT findings: None. CHEST: No abnormal hypermetabolism. Incidental CT findings: Right IJ Port-A-Cath terminates in the low SVC. Atherosclerotic calcification of the aorta, aortic valve and coronary arteries. Heart is enlarged. No pericardial or pleural effusion. Postoperative changes in the left breast and left axilla. Subpleural radiation scarring in the left upper lobe. Centrilobular  emphysema. ABDOMEN/PELVIS: No abnormal hypermetabolism. Incidental CT findings: Infrarenal aorta measures 3.2 cm. 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 SKELETON: No abnormal hypermetabolism. Incidental CT findings: Degenerative changes in the spine. IMPRESSION: 1. No evidence of recurrent or metastatic disease. 2. 3.2 cm infrarenal aortic aneurysm. Recommend follow-up every 3 years. This recommendation follows ACR consensus guidelines: White Paper of the ACR Incidental Findings Committee II on Vascular Findings. J Am Coll Radiol 2013; 10:789-794. 3. 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 4. Aortic atherosclerosis (ICD10-I70.0). Coronary artery calcification. 5.  Emphysema (ICD10-J43.9). Electronically Signed   By: Newell Eke M.D.   On: 03/16/2024 14:28   US  Venous Img Lower Unilateral Left Result Date: 03/14/2024 CLINICAL DATA:  Clinical concern for DVT EXAM: Left LOWER EXTREMITY VENOUS DOPPLER ULTRASOUND TECHNIQUE: Gray-scale sonography with compression, as well as color and duplex ultrasound, were performed to evaluate the deep venous system(s) from the level of the common femoral vein through the popliteal and proximal calf veins. COMPARISON:  None Available. FINDINGS: VENOUS Normal compressibility of the common femoral, superficial femoral, and popliteal veins. Visualized portions of profunda femoral vein and great saphenous vein unremarkable. No filling defects to suggest DVT on grayscale or color Doppler imaging. Doppler waveforms show normal direction of venous flow, normal respiratory plasticity and response to augmentation. Limited views of the contralateral common femoral vein are unremarkable. OTHER One peroneal vein was clearly identified and patent. The anterior tibial vein and  posterior tibial veins are patent. IMPRESSION:  1. No evidence of common femoral, femoral, or popliteal DVT. 2. The anterior tibial, posterior tibial, and at least 1 peroneal vein are patent. No definite ultrasound evidence calf vein DVT with some limitation regarding visualization of the deepest peroneal veins. Electronically Signed   By: Maude Naegeli M.D.   On: 03/14/2024 08:10   ECHOCARDIOGRAM COMPLETE Result Date: 03/13/2024    ECHOCARDIOGRAM REPORT   Patient Name:   Traci HARPENAU Compere Date of Exam: 03/13/2024 Medical Rec #:  [id number]       Height:       61.0 in Accession #:    [id number]      Weight:       162.0 lb Date of Birth:  1956/04/05       BSA:          1.727 m Patient Age:    67 years        BP:           121/75 mmHg Patient Gender: F               HR:           64 bpm. Exam Location:  [facility] Procedure: 2D Echo, 3D Echo, Cardiac Doppler, Color Doppler and Strain Analysis            (Both Spectral and Color Flow Doppler were utilized during            procedure). Indications:    Chemo Z09  History:        Patient has prior history of Echocardiogram examinations, most                 recent 09/09/2023. CAD and Previous Myocardial Infarction, Prior                 CABG; Risk Factors:Hypertension and Dyslipidemia. Breast cancer                 metastasized to axillary lymph node, left (HCC) with mastectomy.  Sonographer:    Aida Pizza RCS Referring Phys: [phone] ALEAN STANDS IMPRESSIONS  1. Left ventricular ejection fraction, by estimation, is 65 to 70%. The left ventricle has normal function. The left ventricle has no regional wall motion abnormalities. There is mild left ventricular hypertrophy. Left ventricular diastolic parameters were normal.  2. Right ventricular systolic function is normal. The right ventricular size is normal.  3. The mitral valve is normal in structure. Trivial mitral valve regurgitation.  4. The aortic valve is tricuspid. Aortic valve regurgitation is trivial. Aortic  valve sclerosis/calcification is present, without any evidence of aortic stenosis.  5. The inferior vena cava is normal in size with <50% respiratory variability, suggesting right atrial pressure of 8 mmHg. FINDINGS  Left Ventricle: Left ventricular ejection fraction, by estimation, is 65 to 70%. The left ventricle has normal function. The left ventricle has no regional wall motion abnormalities. The global longitudinal strain is normal despite suboptimal segment tracking. 3D ejection fraction reviewed and evaluated as part of the interpretation. Alternate measurement of EF is felt to be most reflective of LV function. The left ventricular internal cavity size was normal in size. There is mild left ventricular hypertrophy. Left ventricular diastolic parameters were normal. Right Ventricle: The right ventricular size is normal. Right vetricular wall thickness was not assessed. Right ventricular systolic function is normal. Left Atrium: Left atrial size was normal in size. Right Atrium: Right atrial size was normal in size. Pericardium: There is no evidence of pericardial  effusion. Mitral Valve: The mitral valve is normal in structure. Trivial mitral valve regurgitation. Tricuspid Valve: The tricuspid valve is normal in structure. Tricuspid valve regurgitation is mild. Aortic Valve: The aortic valve is tricuspid. Aortic valve regurgitation is trivial. Aortic valve sclerosis/calcification is present, without any evidence of aortic stenosis. Pulmonic Valve: The pulmonic valve was not well visualized. Pulmonic valve regurgitation is trivial. No evidence of pulmonic stenosis. Aorta: The aortic root is normal in size and structure. Venous: The inferior vena cava is normal in size with less than 50% respiratory variability, suggesting right atrial pressure of 8 mmHg. IAS/Shunts: No atrial level shunt detected by color flow Doppler.  LEFT VENTRICLE PLAX 2D LVIDd:         4.00 cm   Diastology LVIDs:         2.70 cm   LV e'  medial:    7.29 cm/s LV PW:         0.90 cm   LV E/e' medial:  10.5 LV IVS:        1.20 cm   LV e' lateral:   9.25 cm/s LVOT diam:     1.80 cm   LV E/e' lateral: 8.2 LV SV:         60 LV SV Index:   35 LVOT Area:     2.54 cm                           3D Volume EF:                          3D EF:        54 %                          LV EDV:       128 ml                          LV ESV:       59 ml                          LV SV:        69 ml RIGHT VENTRICLE RV S prime:     8.38 cm/s TAPSE (M-mode): 2.0 cm LEFT ATRIUM             Index        RIGHT ATRIUM           Index LA diam:        3.50 cm 2.03 cm/m   RA Area:     14.10 cm LA Vol (A2C):   67.9 ml 39.32 ml/m  RA Volume:   34.20 ml  19.80 ml/m LA Vol (A4C):   37.4 ml 21.66 ml/m LA Biplane Vol: 53.7 ml 31.10 ml/m  AORTIC VALVE LVOT Vmax:   105.00 cm/s LVOT Vmean:  71.300 cm/s LVOT VTI:    0.235 m  AORTA Ao Root diam: 3.30 cm MITRAL VALVE               TRICUSPID VALVE MV Area (PHT): 3.27 cm    TR Peak grad:   23.2 mmHg MV Decel Time: 232 msec    TR Vmax:        241.00 cm/s MV E velocity: 76.20 cm/s MV A velocity: 69.50 cm/s  SHUNTS MV E/A ratio:  1.10        Systemic VTI:  0.24 m                            Systemic Diam: 1.80 cm Vina Gull MD Electronically signed by Vina Gull MD Signature Date/Time: 03/13/2024/2:35:53 PM    Final

## 2024-03-22 NOTE — Progress Notes (Signed)
 Labs reviewed with MD today. Ok to treat per MD. Patient took her pre-meds at home today.   Treatment given per orders. Patient tolerated it well without problems. Vitals stable and discharged home from clinic ambulatory. Follow up as scheduled.

## 2024-03-22 NOTE — Patient Instructions (Signed)
 [facility] - A DEPT OF [facility]  Discharge Instructions: Thank you for choosing [facility] to provide your oncology and hematology care.  If you have a lab appointment with the Cancer Center - please note that after April 8th, 2024, all labs will be drawn in the cancer center.  You do not have to check in or register with the main entrance as you have in the past but will complete your check-in in the cancer center.  Wear comfortable clothing and clothing appropriate for easy access to any Portacath or PICC line.   We strive to give you quality time with your provider. You may need to reschedule your appointment if you arrive late (15 or more minutes).  Arriving late affects you and other patients whose appointments are after yours.  Also, if you miss three or more appointments without notifying the office, you may be dismissed from the clinic at the provider's discretion.      For prescription refill requests, have your pharmacy contact our office and allow 72 hours for refills to be completed.    Today you received the following chemotherapy and/or immunotherapy agents Kanjinti , Perjeta    To help prevent nausea and vomiting after your treatment, we encourage you to take your nausea medication as directed.  BELOW ARE SYMPTOMS THAT SHOULD BE REPORTED IMMEDIATELY: *FEVER GREATER THAN 100.4 F (38 C) OR HIGHER *CHILLS OR SWEATING *NAUSEA AND VOMITING THAT IS NOT CONTROLLED WITH YOUR NAUSEA MEDICATION *UNUSUAL SHORTNESS OF BREATH *UNUSUAL BRUISING OR BLEEDING *URINARY PROBLEMS (pain or burning when urinating, or frequent urination) *BOWEL PROBLEMS (unusual diarrhea, constipation, pain near the anus) TENDERNESS IN MOUTH AND THROAT WITH OR WITHOUT PRESENCE OF ULCERS (sore throat, sores in mouth, or a toothache) UNUSUAL RASH, SWELLING OR PAIN  UNUSUAL VAGINAL DISCHARGE OR ITCHING   Items with * indicate a potential emergency and should be  followed up as soon as possible or go to the Emergency Department if any problems should occur.  Please show the CHEMOTHERAPY ALERT CARD or IMMUNOTHERAPY ALERT CARD at check-in to the Emergency Department and triage nurse.  Should you have questions after your visit or need to cancel or reschedule your appointment, please contact [facility]  - A DEPT OF [facility] [phone]  and follow the prompts.  Office hours are 8:00 a.m. to 4:30 p.m. Monday - Friday. Please note that voicemails left after 4:00 p.m. may not be returned until the following business day.  We are closed weekends and major holidays. You have access to a nurse at all times for urgent questions. Please call the main number to the clinic [phone] and follow the prompts.  For any non-urgent questions, you may also contact your provider using MyChart. We now offer e-Visits for anyone 25 and older to request care online for non-urgent symptoms. For details visit [email or web address].   Also download the MyChart app! Go to the app store, search MyChart, open the app, select [facility], and log in with your MyChart username and password.

## 2024-03-22 NOTE — Patient Instructions (Addendum)
 [facility] at [facility] Discharge Instructions   You were seen and examined today by Dr. Rogers.  He reviewed the results of your lab work which are normal/stable.   He reviewed the results of your PET scan which did not show any evidence of cancer.   We will give you a break from your treatment after today since the scan did not show any evidence of cancer.   Return as scheduled.    Thank you for choosing [facility] at [facility] to provide your oncology and hematology care.  To afford each patient quality time with our provider, please arrive at least 15 minutes before your scheduled appointment time.   If you have a lab appointment with the Cancer Center please come in thru the Main Entrance and check in at the main information desk.  You need to re-schedule your appointment should you arrive 10 or more minutes late.  We strive to give you quality time with our providers, and arriving late affects you and other patients whose appointments are after yours.  Also, if you no show three or more times for appointments you may be dismissed from the clinic at the providers discretion.     Again, thank you for choosing [facility].  Our hope is that these requests will decrease the amount of time that you wait before being seen by our physicians.       _____________________________________________________________  Should you have questions after your visit to [facility], please contact our office at [phone] and follow the prompts.  Our office hours are 8:00 a.m. and 4:30 p.m. Monday - Friday.  Please note that voicemails left after 4:00 p.m. may not be returned until the following business day.  We are closed weekends and major holidays.  You do have access to a nurse 24-7, just call the main number to the clinic [phone] and do not press any options, hold on the line and a nurse will answer the phone.     For prescription refill requests, have your pharmacy contact our office and allow 72 hours.    Due to Covid, you will need to wear a mask upon entering the hospital. If you do not have a mask, a mask will be given to you at the Main Entrance upon arrival. For doctor visits, patients may have 1 support person age 15 or older with them. For treatment visits, patients can not have anyone with them due to social distancing guidelines and our immunocompromised population.

## 2024-03-23 ENCOUNTER — Other Ambulatory Visit: Payer: Self-pay

## 2024-04-09 ENCOUNTER — Encounter: Payer: Self-pay | Admitting: Internal Medicine

## 2024-04-16 ENCOUNTER — Other Ambulatory Visit: Payer: Self-pay | Admitting: *Deleted

## 2024-04-16 MED ORDER — ANASTROZOLE 1 MG PO TABS
1.0000 mg | ORAL_TABLET | Freq: Every day | ORAL | 4 refills | Status: AC
Start: 2024-04-16 — End: ?

## 2024-04-25 ENCOUNTER — Other Ambulatory Visit (HOSPITAL_COMMUNITY): Payer: Self-pay | Admitting: Internal Medicine

## 2024-04-25 ENCOUNTER — Ambulatory Visit (HOSPITAL_COMMUNITY)
Admission: RE | Admit: 2024-04-25 | Discharge: 2024-04-25 | Disposition: A | Discharge status: Home / Self Care | Source: Ambulatory Visit | Attending: Hematology | Admitting: Hematology

## 2024-04-25 ENCOUNTER — Ambulatory Visit (HOSPITAL_COMMUNITY)
Admission: RE | Admit: 2024-04-25 | Discharge: 2024-04-25 | Disposition: A | Discharge status: Home / Self Care | Source: Ambulatory Visit | Attending: Internal Medicine | Admitting: Internal Medicine

## 2024-04-25 DIAGNOSIS — C50812 Malignant neoplasm of overlapping sites of left female breast: Secondary | ICD-10-CM | POA: Diagnosis present

## 2024-04-25 DIAGNOSIS — Z170 Estrogen receptor positive status [ER+]: Secondary | ICD-10-CM | POA: Diagnosis present

## 2024-04-25 DIAGNOSIS — Z1231 Encounter for screening mammogram for malignant neoplasm of breast: Secondary | ICD-10-CM

## 2024-04-25 DIAGNOSIS — Z79899 Other long term (current) drug therapy: Secondary | ICD-10-CM | POA: Insufficient documentation

## 2024-05-22 NOTE — Telephone Encounter (Signed)
 Pt. Phoned in and stated that when she took the 300 mg of Gabapentin , it was to strong for her. She tried her daughters 100 mg and that work much better for pt. Pt stated she will speak with her PCP about changing Rx to work up to the 300 mg.

## 2024-06-12 ENCOUNTER — Other Ambulatory Visit: Payer: Self-pay | Admitting: *Deleted

## 2024-06-12 DIAGNOSIS — I82412 Acute embolism and thrombosis of left femoral vein: Secondary | ICD-10-CM

## 2024-06-12 MED ORDER — APIXABAN 5 MG PO TABS: 5.0000 mg | ORAL_TABLET | Freq: Two times a day (BID) | ORAL | 5 refills | Status: AC

## 2024-06-12 NOTE — Telephone Encounter (Signed)
 Per chart review, continue eliquis .

## 2024-06-18 ENCOUNTER — Other Ambulatory Visit: Payer: Self-pay | Admitting: *Deleted

## 2024-06-18 ENCOUNTER — Encounter: Payer: Self-pay | Admitting: Internal Medicine

## 2024-06-18 MED ORDER — MAGNESIUM OXIDE -MG SUPPLEMENT 400 (240 MG) MG PO TABS: 800.0000 mg | ORAL_TABLET | Freq: Two times a day (BID) | ORAL | 3 refills | Status: AC

## 2024-06-21 ENCOUNTER — Ambulatory Visit (HOSPITAL_COMMUNITY)

## 2024-06-25 ENCOUNTER — Ambulatory Visit (HOSPITAL_COMMUNITY)
Admission: RE | Admit: 2024-06-25 | Discharge: 2024-06-25 | Disposition: A | Discharge status: Home / Self Care | Source: Ambulatory Visit | Attending: Hematology | Admitting: Hematology

## 2024-06-25 DIAGNOSIS — Z170 Estrogen receptor positive status [ER+]: Secondary | ICD-10-CM | POA: Diagnosis present

## 2024-06-25 DIAGNOSIS — C50812 Malignant neoplasm of overlapping sites of left female breast: Secondary | ICD-10-CM | POA: Diagnosis present

## 2024-06-25 DIAGNOSIS — E860 Dehydration: Secondary | ICD-10-CM | POA: Diagnosis present

## 2024-06-25 MED ORDER — IOHEXOL 300 MG/ML  SOLN
100.0000 mL | Freq: Once | INTRAMUSCULAR | Status: AC | PRN
  Administered 2024-06-25: 80 mL via INTRAVENOUS

## 2024-06-29 LAB — POCT I-STAT CREATININE: Creatinine, Ser: 1.7 mg/dL — ABNORMAL HIGH (ref 0.44–1.00)

## 2024-07-02 ENCOUNTER — Inpatient Hospital Stay: Attending: Oncology | Admitting: Oncology

## 2024-07-02 ENCOUNTER — Inpatient Hospital Stay

## 2024-07-02 VITALS — BP 150/81 | HR 65 | Temp 97.9°F | Resp 18 | Ht 61.0 in | Wt 159.0 lb

## 2024-07-02 DIAGNOSIS — I82412 Acute embolism and thrombosis of left femoral vein: Secondary | ICD-10-CM

## 2024-07-02 DIAGNOSIS — Z7901 Long term (current) use of anticoagulants: Secondary | ICD-10-CM | POA: Insufficient documentation

## 2024-07-02 DIAGNOSIS — M8580 Other specified disorders of bone density and structure, unspecified site: Secondary | ICD-10-CM | POA: Insufficient documentation

## 2024-07-02 DIAGNOSIS — C50812 Malignant neoplasm of overlapping sites of left female breast: Secondary | ICD-10-CM | POA: Diagnosis not present

## 2024-07-02 DIAGNOSIS — D508 Other iron deficiency anemias: Secondary | ICD-10-CM

## 2024-07-02 DIAGNOSIS — Z9012 Acquired absence of left breast and nipple: Secondary | ICD-10-CM | POA: Insufficient documentation

## 2024-07-02 DIAGNOSIS — Z79811 Long term (current) use of aromatase inhibitors: Secondary | ICD-10-CM | POA: Insufficient documentation

## 2024-07-02 DIAGNOSIS — N189 Chronic kidney disease, unspecified: Secondary | ICD-10-CM | POA: Insufficient documentation

## 2024-07-02 DIAGNOSIS — C50412 Malignant neoplasm of upper-outer quadrant of left female breast: Secondary | ICD-10-CM | POA: Insufficient documentation

## 2024-07-02 DIAGNOSIS — Z170 Estrogen receptor positive status [ER+]: Secondary | ICD-10-CM | POA: Insufficient documentation

## 2024-07-02 DIAGNOSIS — Z7982 Long term (current) use of aspirin: Secondary | ICD-10-CM | POA: Insufficient documentation

## 2024-07-02 DIAGNOSIS — Z1721 Progesterone receptor positive status: Secondary | ICD-10-CM | POA: Insufficient documentation

## 2024-07-02 DIAGNOSIS — C50212 Malignant neoplasm of upper-inner quadrant of left female breast: Secondary | ICD-10-CM | POA: Insufficient documentation

## 2024-07-02 DIAGNOSIS — D631 Anemia in chronic kidney disease: Secondary | ICD-10-CM

## 2024-07-02 DIAGNOSIS — D649 Anemia, unspecified: Secondary | ICD-10-CM | POA: Insufficient documentation

## 2024-07-02 DIAGNOSIS — Z86718 Personal history of other venous thrombosis and embolism: Secondary | ICD-10-CM | POA: Insufficient documentation

## 2024-07-02 DIAGNOSIS — N1832 Chronic kidney disease, stage 3b: Secondary | ICD-10-CM

## 2024-07-02 DIAGNOSIS — Z95828 Presence of other vascular implants and grafts: Secondary | ICD-10-CM

## 2024-07-02 DIAGNOSIS — Z1732 Human epidermal growth factor receptor 2 negative status: Secondary | ICD-10-CM | POA: Insufficient documentation

## 2024-07-02 DIAGNOSIS — Z79899 Other long term (current) drug therapy: Secondary | ICD-10-CM | POA: Insufficient documentation

## 2024-07-02 DIAGNOSIS — C50919 Malignant neoplasm of unspecified site of unspecified female breast: Secondary | ICD-10-CM

## 2024-07-02 DIAGNOSIS — Z9221 Personal history of antineoplastic chemotherapy: Secondary | ICD-10-CM | POA: Insufficient documentation

## 2024-07-02 DIAGNOSIS — F17210 Nicotine dependence, cigarettes, uncomplicated: Secondary | ICD-10-CM | POA: Insufficient documentation

## 2024-07-02 DIAGNOSIS — B0229 Other postherpetic nervous system involvement: Secondary | ICD-10-CM | POA: Insufficient documentation

## 2024-07-02 LAB — CBC WITH DIFFERENTIAL/PLATELET
Abs Immature Granulocytes: 0.03 K/uL (ref 0.00–0.07)
Basophils Absolute: 0.1 K/uL (ref 0.0–0.1)
Basophils Relative: 1 %
Eosinophils Absolute: 0.2 K/uL (ref 0.0–0.5)
Eosinophils Relative: 3 %
HCT: 39.7 % (ref 36.0–46.0)
Hemoglobin: 12.6 g/dL (ref 12.0–15.0)
Immature Granulocytes: 1 %
Lymphocytes Relative: 11 %
Lymphs Abs: 0.7 K/uL (ref 0.7–4.0)
MCH: 31.6 pg (ref 26.0–34.0)
MCHC: 31.7 g/dL (ref 30.0–36.0)
MCV: 99.5 fL (ref 80.0–100.0)
Monocytes Absolute: 0.4 K/uL (ref 0.1–1.0)
Monocytes Relative: 6 %
Neutro Abs: 5.2 K/uL (ref 1.7–7.7)
Neutrophils Relative %: 78 %
Platelets: 218 K/uL (ref 150–400)
RBC: 3.99 MIL/uL (ref 3.87–5.11)
RDW: 14.8 % (ref 11.5–15.5)
WBC: 6.6 K/uL (ref 4.0–10.5)
nRBC: 0 % (ref 0.0–0.2)

## 2024-07-02 LAB — IRON AND TIBC
Iron: 80 ug/dL (ref 28–170)
Saturation Ratios: 26 % (ref 10.4–31.8)
TIBC: 302 ug/dL (ref 250–450)
UIBC: 222 ug/dL

## 2024-07-02 LAB — COMPREHENSIVE METABOLIC PANEL WITH GFR
ALT: 17 U/L (ref 0–44)
AST: 19 U/L (ref 15–41)
Albumin: 4.1 g/dL (ref 3.5–5.0)
Alkaline Phosphatase: 138 U/L — ABNORMAL HIGH (ref 38–126)
Anion gap: 10 (ref 5–15)
BUN: 25 mg/dL — ABNORMAL HIGH (ref 8–23)
CO2: 24 mmol/L (ref 22–32)
Calcium: 10.3 mg/dL (ref 8.9–10.3)
Chloride: 104 mmol/L (ref 98–111)
Creatinine, Ser: 1.51 mg/dL — ABNORMAL HIGH (ref 0.44–1.00)
GFR, Estimated: 37 mL/min — ABNORMAL LOW (ref >60)
Glucose, Bld: 91 mg/dL (ref 70–99)
Potassium: 4.6 mmol/L (ref 3.5–5.1)
Sodium: 138 mmol/L (ref 135–145)
Total Bilirubin: 0.4 mg/dL (ref 0.0–1.2)
Total Protein: 6.9 g/dL (ref 6.5–8.1)

## 2024-07-02 LAB — FOLATE: Folate: 7.9 ng/mL (ref >5.9)

## 2024-07-02 LAB — VITAMIN B12: Vitamin B-12: 329 pg/mL (ref 180–914)

## 2024-07-02 LAB — FERRITIN: Ferritin: 116 ng/mL (ref 11–307)

## 2024-07-02 LAB — MAGNESIUM: Magnesium: 2.1 mg/dL (ref 1.7–2.4)

## 2024-07-02 NOTE — Progress Notes (Signed)
 Called pt to let her know her magnesium  is normal.

## 2024-07-02 NOTE — Progress Notes (Signed)
 Patient Care Team: Shona Norleen PEDLAR, MD as PCP - General (Internal Medicine) Court Dorn PARAS, MD as PCP - Cardiology (Cardiology) Celestia Joesph SQUIBB, RN as Oncology Nurse Navigator (Medical Oncology)  Clinic Day:  07/03/2024  Referring physician: Shona Norleen PEDLAR, MD   CHIEF COMPLAINT:  CC: Locally advanced ER/PR positive, HER2 positive left breast cancer   Traci Fuller 68 y.o. female was transferred to my care after her prior physician has left.   ASSESSMENT & PLAN:   Assessment & Plan: Traci Fuller  is a 69 y.o. female with ER/PR positive, HER2 positive left breast carcinoma  Locally advanced ER/PR positive, HER2 positive left breast carcinoma Extensive oncology history below Patient completed neoadjuvant chemotherapy with TCHP followed by maintenance Herceptin  and pertuzumab  for a year. Currently on anastrozole  alone Surveillance scans negative for evidence of disease  -We reviewed the recent CT scan findings together.  There is no evidence of disease at this time.  The lung nodules are stable. - Continue anastrozole  1 mg daily.  Total duration of 5 years that is 2028. - Continue calcium  and vitamin D supplementation - Repeat CT scan in 3 months.  Return to clinic in 3 months with labs and CT scan to discuss results and further management  Chronic kidney disease with worsening renal function Elevated creatinine. History of kidney failure during chemotherapy. Possible chemotherapy-related damage.  Primary care emphasized the importance of nephrology follow-up.  Patient is thinking at this time  Hypomagnesemia Magnesium  at 1.3 mg/dL previously. Oral supplementation causes diarrhea. - Will repeat labs today and administer IV magnesium  if magnesium  is low - Continue oral magnesium  supplements.  Osteopenia No significant changes in bone density. - Continue calcium  and vitamin D. - Repeat DEXA scan in two years.  Anemia Mild anemia with hemoglobin at 11.5 g/dL. No  significant symptoms. - Ordered blood work to reassess.  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 No new thrombotic events. - Continue Eliquis .  Postherpetic neuralgia Facial pain from shingles persists. - Continue gabapentin .  Tobacco use Reduced to three cigarettes a day. - Encouraged further reduction.    The patient understands the plans discussed today and is in agreement with them.  She knows to contact our office if she develops concerns prior to her next appointment.  60 minutes of total time was spent for this patient encounter, including preparation,review of records,  face-to-face counseling with the patient and coordination of care, physical exam, and documentation of the encounter.    Traci Fuller,acting as a neurosurgeon for Mickiel Dry, MD.,have documented all relevant documentation on the behalf of Mickiel Dry, MD,as directed by  Mickiel Dry, MD while in the presence of Mickiel Dry, MD.  I, Mickiel Dry MD, have reviewed the above documentation for accuracy and completeness, and I agree with the above.    Mickiel Dry, MD  [facility] [place] - A DEPT OF [facility] [address] [place] [address] Dept: [phone] Dept Fax: [phone]   Orders Placed This Encounter  Procedures   CT CHEST ABDOMEN PELVIS W CONTRAST    Standing Status:   Future    Expected Date:   10/02/2024    Expiration Date:   07/02/2025    If indicated for the ordered procedure, I authorize the administration of contrast media per Radiology protocol:   Yes    Does the patient have a contrast media/X-ray dye allergy?:  No    Preferred imaging location?:   [facility]    If indicated for the ordered procedure, I authorize the administration of oral contrast media per Radiology protocol:   Yes   CBC with Differential   Iron  and TIBC ([facility] ONLY)   Ferritin   Vitamin B12   Folate     ONCOLOGY HISTORY:   I have reviewed her chart and materials related to her cancer extensively and collaborated history with the patient. Summary of oncologic history is as follows:   Diagnosis: Oligometastatic (two 7 mm lung lesions) HER2 positive left breast cancer   -Presentation: Palpable lump in left breast that progressively enlarged over 4 months -09/10/2021: Diagnostic Bilateral Mammogram and US : Multiple left breast masses, the largest associated with pleomorphic calcifications and architectural distortion, highly suspicious for multifocal breast carcinoma. Three abnormal left axillary lymph nodes suspicious for metastatic lymphadenopathy. Coarse heterogeneous right breast calcifications extending laterally from the nipple spanning 2.3 cm in long axis. -09/24/2021: Left breast 4:00 biopsy.  Pathology: Invasive mammary carcinoma. Tumor cells are ER positive (100%), PR positive (70%), and Her2 positive by FISH. Ki-67 25%.  -09/24/2021: Left breast 2:00 biopsy.  Pathology: Invasive ductal carcinoma. Tumor cells are ER positive (100%), PR positive (70%), and HER2 negative by FISH.  -09/24/2021: Left axillary biopsy.  Pathology: Metastatic carcinoma involving nodal tissue. Tumor cells are ER positive (100%), PR positive (50%), and Her2 negative by FISH.  -10/01/2021: Right breast biopsy.  Pathology: No malignancy identified. Fibroadenomatoid nodule with calcifications. Fibrocystic changes including apocrine metaplasia.  -10/09/2021: Bilateral breast MRI: Multiple enhancing masses with intervening non-mass enhancement involving most of the LOWER OUTER QUADRANT of the LEFT breast and also involving the UPPER OUTER QUADRANT, spanning approximately 12.4 x 6.4 x 7.0 cm. The previously biopsied masses are present within this extensive enhancement. Diffuse skin thickening/edema and trabecular thickening throughout the LEFT breast,  with minimal enhancement of the skin, likely indicating dermal involvement. Multiple (at least 14) abnormal LEFT axillary lymph nodes. No pathologic lymphadenopathy elsewhere. Indeterminate Non-mass enhancement inferior to the biopsy site in the subareolar RIGHT breast spanning 1.5 x 0.9 x 0.8 cm. This may reflect post biopsy change, but is associated with indeterminate plateau enhancement kinetics. -10/15/2021: CA 15-3: 130. CA 27.29: 143.7 -10/22/2021: Initial PET: Findings are consistent with locally advanced left breast cancer. There is a large hypermetabolic mass laterally in the left breast with multiple hypermetabolic left axillary, subpectoral and internal mammary lymph nodes as well as 2 hypermetabolic left upper lobe pulmonary nodules, suspicious for metastases. Nonspecific low-level subareolar activity in the right breast near the surgical clips with a single mildly hypermetabolic right axillary lymph node. No distant metastases identified.  -11/04/2021-03/11/2022: 6 cycles of dose reduced TCHP completed -04/01/2022-03/22/2024: Maintenance Herceptin  and pertuzumab , discontinued secondary to stable disease with close monitoring of lung lesions -04/01/2022-current: Anastrozole  -05/05/2022: Bilateral mastectomy and left axillary lymph node dissection.  Pathology: Residual invasive ductal carcinoma in left breast, grade 2, discontinuously involving a fibrotic area measuring 5.9 cm. 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 Resection margins are negative for carcinoma. Lymphovascular invasion is present. Nine of eighteen lymph nodes positive for metastatic carcinoma (9/18), largest focus is 1.2 cm with evidence of extranodal extension. Right breast negative for carcinoma. Staging: pT3, pN2a. Tumor cells are ER positive (95%), PR positive (20%), and Her2 negative. Residual Cancer Burden Class: RCB-III.  -2023-current: Stable disease per imaging -06/22/2022: NM Body Scan: Questionable  faint uptake at the LEFT anterior second costochondral  junction, nonspecific. No definite scintigraphic evidence of osseous metastatic disease. -09/13/2022: Radiation therapy to chest wall completed   Current Treatment:  Surveillance  INTERVAL HISTORY:   Discussed the use of AI scribe software for clinical note transcription with the patient, who gave verbal consent to proceed.  History of Present Illness Traci Fuller is a 68 year old female with breast cancer who presents for follow-up .  She has been on anastrozole  since August 2023 for breast cancer treatment, taking it at night. She experiences hot flashes at least once a night, which she describes as tolerable. She denies small joint pains but reports persistent numbness since her chemotherapy, which ended on March 22, 2024.  She has a history of kidney issues, including an episode of kidney failure requiring hospitalization when she first started chemotherapy. Her creatinine levels have been increasing. She does not have diabetes and attributes some kidney issues to past chemotherapy treatments.  She is currently taking magnesium  supplements, three pills a day, but her magnesium  levels remain low, causing occasional diarrhea. She recalls severe diarrhea during chemotherapy. She also takes gabapentin  for facial pain resulting from shingles.  She has a history of a left leg DVT and is on Eliquis . She reports that her last DEXA scan showed osteopenia and that it had not changed from previous scans. She continues to take calcium  and vitamin D supplements. She had a hemoglobin level of 11.5 in July.  Socially, she has reduced smoking to one to three cigarettes a day, primarily after meals. She works part-time and is involved in her granddaughter's education.    I have reviewed the past medical history, past surgical history, social history and family history with the patient and they are unchanged from previous note.  ALLERGIES:  has no  known allergies.  MEDICATIONS:  Current Outpatient Medications  Medication Sig Dispense Refill   ALPRAZolam  (XANAX ) 0.25 MG tablet TAKE 1 TABLET(0.25 MG) BY MOUTH TWICE DAILY AS NEEDED FOR ANXIETY 60 tablet 0   amLODipine  (NORVASC ) 10 MG tablet Take 10 mg by mouth daily.     anastrozole  (ARIMIDEX ) 1 MG tablet Take 1 tablet (1 mg total) by mouth daily. 90 tablet 4   apixaban  (ELIQUIS ) 5 MG TABS tablet Take 1 tablet (5 mg total) by mouth 2 (two) times daily. 60 tablet 5   aspirin  EC 81 MG tablet Take 1 tablet (81 mg total) by mouth daily. 90 tablet 3   Calcium  Carb-Cholecalciferol (CALCIUM  600/VITAMIN D3 PO) Take 1 tablet by mouth in the morning and at bedtime.     gabapentin  (NEURONTIN ) 300 MG capsule Take 300 mg by mouth 2 (two) times daily.     magnesium  oxide (MAG-OX) 400 (240 Mg) MG tablet Take 2 tablets (800 mg total) by mouth 2 (two) times daily. 120 tablet 3   metoprolol  tartrate (LOPRESSOR ) 25 MG tablet Take 1 tablet (25 mg total) by mouth 2 (two) times daily. Please call and schedule overdue appointment with Dr. Court for further refills . SECOND ATTEMPT! 30 tablet 0   omeprazole (PRILOSEC OTC) 20 MG tablet Take 20 mg by mouth daily.     ondansetron  (ZOFRAN -ODT) 4 MG disintegrating tablet Take 1 tablet (4 mg total) by mouth every 6 (six) hours as needed for nausea. (Patient not taking: Reported on 07/02/2024) 20 tablet 0   Pertuzumab  (PERJETA  IV) Inject into the vein every 21 ( twenty-one) days. (Patient not taking: Reported on 07/02/2024)     sulfamethoxazole -trimethoprim  (BACTRIM  DS) 800-160 MG tablet Take 1  tablet by mouth 2 (two) times daily. (Patient not taking: Reported on 07/02/2024) 14 tablet 0   trastuzumab -anns (KANJINTI ) 150 MG SOLR injection Inject 462 mg into the vein every 21 ( twenty-one) days. (Patient not taking: Reported on 07/02/2024)     No current facility-administered medications for this visit.    REVIEW OF SYSTEMS:   Constitutional: Denies fevers, chills or  abnormal weight loss Eyes: Denies blurriness of vision Ears, nose, mouth, throat, and face: Denies mucositis or sore throat Respiratory: Denies cough, dyspnea or wheezes Cardiovascular: Denies palpitation, chest discomfort or lower extremity swelling Gastrointestinal:  Denies nausea, heartburn or change in bowel habits Skin: Denies abnormal skin rashes Lymphatics: Denies new lymphadenopathy or easy bruising Neurological:+ numbness, tingling  Behavioral/Psych: Mood is stable, no new changes  All other systems were reviewed with the patient and are negative.   VITALS:  Blood pressure (!) 150/81, pulse 65, temperature 97.9 F (36.6 C), resp. rate 18, height 5' 1 (1.549 m), weight 158 lb 15.2 oz (72.1 kg), SpO2 99%.  Wt Readings from Last 3 Encounters:  07/02/24 158 lb 15.2 oz (72.1 kg)  03/22/24 161 lb 13.1 oz (73.4 kg)  03/01/24 162 lb (73.5 kg)    Body mass index is 30.03 kg/m.  Performance status (ECOG): 1 - Symptomatic but completely ambulatory  PHYSICAL EXAM:   GENERAL:alert, no distress and comfortable SKIN: skin color, texture, turgor are normal, no rashes or significant lesions BREAST: Right breast exam: Normal left breast exam: Postmastectomy, some lymphedema present.  No masses palpated.  No tenderness. LYMPH:  no palpable lymphadenopathy in the cervical, axillary or inguinal LUNGS: clear to auscultation and percussion with normal breathing effort HEART: regular rate & rhythm and no murmurs and no lower extremity edema ABDOMEN:abdomen soft, non-tender and normal bowel sounds Musculoskeletal:no cyanosis of digits and no clubbing  NEURO: alert & oriented x 3 with fluent speech   LABORATORY DATA:  I have reviewed the data as listed   Lab Results  Component Value Date   WBC 6.6 07/02/2024   NEUTROABS 5.2 07/02/2024   HGB 12.6 07/02/2024   HCT 39.7 07/02/2024   MCV 99.5 07/02/2024   PLT 218 07/02/2024      Chemistry      Component Value Date/Time   NA 138  07/02/2024 0909   NA 142 01/19/2018 1224   K 4.6 07/02/2024 0909   CL 104 07/02/2024 0909   CO2 24 07/02/2024 0909   BUN 25 (H) 07/02/2024 0909   BUN 10 01/19/2018 1224   CREATININE 1.51 (H) 07/02/2024 0909      Component Value Date/Time   CALCIUM  10.3 07/02/2024 0909   ALKPHOS 138 (H) 07/02/2024 0909   AST 19 07/02/2024 0909   ALT 17 07/02/2024 0909   BILITOT 0.4 07/02/2024 0909   BILITOT 0.6 03/09/2018 0758       Latest Reference Range & Units 07/02/24 09:09  Iron  28 - 170 ug/dL 80  UIBC ug/dL 777  TIBC 749 - 549 ug/dL 697  Saturation Ratios 10.4 - 31.8 % 26  Ferritin 11 - 307 ng/mL 116  Folate >5.9 ng/mL 7.9  Vitamin B12 180 - 914 pg/mL 329   RADIOGRAPHIC STUDIES: I have personally reviewed the radiological images as listed and agreed with the findings in the report.  CT CHEST ABDOMEN PELVIS W CONTRAST EXAM: CT CHEST, ABDOMEN AND PELVIS WITH CONTRAST 06/25/2024 12:49:55 PM  TECHNIQUE: CT of the chest, abdomen and pelvis was performed with the administration of 80 mL of  Omnipaque  300. Multiplanar reformatted images are provided for review. Automated exposure control, iterative reconstruction, and/or weight based adjustment of the mA/kV was utilized to reduce the radiation dose to as low as reasonably achievable.  CONTRAST: 80 mL of Omnipaque  300.  COMPARISON: CT Chest Abdomen Pelvis 02/16/2024; 09/09/2023.  CLINICAL HISTORY: Evaluate for metastatic disease and malignant neoplasm of overlapping sites of left breast.  FINDINGS:  CHEST: MEDIASTINUM AND LYMPH NODES: Heart and pericardium are unremarkable. The central airways are clear. Atherosclerosis of the aortic, great vessels and coronary arteries again noted. Right subclavian port a cath extends to the superior cavoatrial junction. No enlarged mediastinal, hilar or axillary lymph nodes are identified. No enlarged internal mammary lymph nodes.  LUNGS AND PLEURA: Moderate centrilobular and paraseptal  emphysema with post radiation scarring anteriorly in the left upper lobe. There are scattered small pulmonary nodules bilaterally measuring up to 6 mm in the right lower lobe on image 84/5 and 3 mm in the left lower lobe on image 110/5. These nodules are unchanged. No new or enlarging nodules are identified. No focal consolidation or pulmonary edema. No pleural effusion or pneumothorax.  ABDOMEN AND PELVIS: LIVER: The liver is unremarkable.  GALLBLADDER AND BILE DUCTS: Gallbladder is unremarkable. No biliary ductal dilatation.  SPLEEN: No acute abnormality.  PANCREAS: No acute abnormality.  ADRENAL GLANDS: Stable chronic nodular thickening of the left adrenal gland without hypermetabolic activity on PET CT, most consistent with hyperplasia.  KIDNEYS, URETERS AND BLADDER: Mild renal cortical scarring and thinning bilaterally. No evidence of renal mass or hydronephrosis. No stones in the kidneys or ureters. No perinephric or periureteral stranding. Urinary bladder is unremarkable.  GI AND BOWEL: Stomach demonstrates no acute abnormality. No enteric contrast administered. No evidence of bowel wall thickening, distention or surrounding inflammation. Mildly prominent stool throughout the colon. The appendix appears normal. There is no bowel obstruction.  REPRODUCTIVE ORGANS: Post hysterectomy. No adnexal mass.  PERITONEUM AND RETROPERITONEUM: There is no evidence of ascites or peritoneal nodularity. This is a stable mixed fatty and soft tissue presacral mass measuring 5.6x3.1 cm on image 96/2. 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 The lesion was not metabolically active on the previous PET CT. No free air.  VASCULATURE: Aortic and branch vessel atherosclerosis with stable infrarenal abdominal aortic aneurysm measuring 3.1 cm on image 63/2. No acute vascular findings are demonstrated.  ABDOMINAL AND PELVIS LYMPH NODES: No lymphadenopathy.  BONES AND  SOFT TISSUES: Stable postsurgical changes in the left axilla consistent with previous axillary node dissection. Post left mastectomy. No acute osseous abnormality. No focal soft tissue abnormality.  IMPRESSION: 1. No evidence of metastatic disease. 2. Stable postsurgical changes from previous left mastectomy and axillary nodal dissection. 3. Stable small pulmonary nodules bilaterally, likely benign based on stability. 4. Moderate centrilobular and paraseptal emphysema. 5. 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 6. Stable infrarenal abdominal aortic aneurysm measuring 3.1 cm. Recommend ultrasound follow-up in 3 years.  Electronically signed by: Elsie Perone MD 06/28/2024 11:09 AM EST RP Workstation: [id number]

## 2024-07-02 NOTE — Progress Notes (Signed)
 Patients port flushed without difficulty.  Good blood return noted with no bruising or swelling noted at site.  Labs drawn per orders.  VSS with discharge and left in satisfactory condition with no s/s of distress noted. All follow ups as scheduled.       Kierre Deines

## 2024-07-02 NOTE — Patient Instructions (Signed)
 [facility] Discharge Instructions   You were seen and examined today by Dr. Davonna.  She reviewed the results of your lab work which are normal/stable.   We will see you back in 3 months. We will repeat a CT scan and lab work prior to this visit.    Return as scheduled.    Thank you for choosing [facility] at [facility] to provide your oncology and hematology care.  To afford each patient quality time with our provider, please arrive at least 15 minutes before your scheduled appointment time.   If you have a lab appointment with the Cancer Center please come in thru the Main Entrance and check in at the main information desk.  You need to re-schedule your appointment should you arrive 10 or more minutes late.  We strive to give you quality time with our providers, and arriving late affects you and other patients whose appointments are after yours.  Also, if you no show three or more times for appointments you may be dismissed from the clinic at the providers discretion.     Again, thank you for choosing [facility].  Our hope is that these requests will decrease the amount of time that you wait before being seen by our physicians.       _____________________________________________________________  Should you have questions after your visit to [facility], please contact our office at [phone] and follow the prompts.  Our office hours are 8:00 a.m. and 4:30 p.m. Monday - Friday.  Please note that voicemails left after 4:00 p.m. may not be returned until the following business day.  We are closed weekends and major holidays.  You do have access to a nurse 24-7, just call the main number to the clinic [phone] and do not press any options, hold on the line and a nurse will answer the phone.    For prescription refill requests, have your pharmacy contact our office and allow 72 hours.    Due to  Covid, you will need to wear a mask upon entering the hospital. If you do not have a mask, a mask will be given to you at the Main Entrance upon arrival. For doctor visits, patients may have 1 support person age 67 or older with them. For treatment visits, patients can not have anyone with them due to social distancing guidelines and our immunocompromised population.

## 2024-07-03 ENCOUNTER — Other Ambulatory Visit: Payer: Self-pay | Admitting: Oncology

## 2024-07-03 LAB — CANCER ANTIGEN 27.29: CA 27.29: 44.7 U/mL — ABNORMAL HIGH (ref 0.0–38.6)

## 2024-07-03 LAB — CANCER ANTIGEN 15-3: CA 15-3: 36.6 U/mL — ABNORMAL HIGH (ref 0.0–25.0)

## 2024-07-04 ENCOUNTER — Encounter: Payer: Self-pay | Admitting: Oncology

## 2024-07-04 NOTE — Telephone Encounter (Signed)
 Please advise and see note I sent her.

## 2024-08-20 ENCOUNTER — Encounter: Payer: Self-pay | Admitting: *Deleted

## 2024-09-26 ENCOUNTER — Encounter (HOSPITAL_COMMUNITY): Payer: Self-pay | Admitting: Oncology

## 2024-10-02 ENCOUNTER — Ambulatory Visit (HOSPITAL_COMMUNITY)

## 2024-10-09 ENCOUNTER — Inpatient Hospital Stay: Attending: Oncology | Admitting: Oncology
